# Patient Record
Sex: Female | Born: 1935 | Race: White | Hispanic: No | Smoking: Never smoker
Health system: Southern US, Community
[De-identification: ages and names within clinical notes are randomized; demographics above are authoritative.]

## PROBLEM LIST (undated history)

## (undated) DIAGNOSIS — I10 Essential (primary) hypertension: Secondary | ICD-10-CM

## (undated) DIAGNOSIS — C4490 Unspecified malignant neoplasm of skin, unspecified: Secondary | ICD-10-CM

## (undated) DIAGNOSIS — Z950 Presence of cardiac pacemaker: Secondary | ICD-10-CM

## (undated) DIAGNOSIS — E1140 Type 2 diabetes mellitus with diabetic neuropathy, unspecified: Secondary | ICD-10-CM

## (undated) DIAGNOSIS — E538 Deficiency of other specified B group vitamins: Secondary | ICD-10-CM

## (undated) DIAGNOSIS — I2510 Atherosclerotic heart disease of native coronary artery without angina pectoris: Secondary | ICD-10-CM

## (undated) DIAGNOSIS — R001 Bradycardia, unspecified: Secondary | ICD-10-CM

## (undated) DIAGNOSIS — I5032 Chronic diastolic (congestive) heart failure: Secondary | ICD-10-CM

## (undated) DIAGNOSIS — E785 Hyperlipidemia, unspecified: Secondary | ICD-10-CM

## (undated) DIAGNOSIS — J8410 Pulmonary fibrosis, unspecified: Secondary | ICD-10-CM

## (undated) DIAGNOSIS — R7989 Other specified abnormal findings of blood chemistry: Secondary | ICD-10-CM

## (undated) DIAGNOSIS — K635 Polyp of colon: Secondary | ICD-10-CM

## (undated) DIAGNOSIS — D649 Anemia, unspecified: Secondary | ICD-10-CM

## (undated) DIAGNOSIS — R55 Syncope and collapse: Secondary | ICD-10-CM

## (undated) DIAGNOSIS — K631 Perforation of intestine (nontraumatic): Secondary | ICD-10-CM

## (undated) DIAGNOSIS — I6529 Occlusion and stenosis of unspecified carotid artery: Secondary | ICD-10-CM

## (undated) DIAGNOSIS — G250 Essential tremor: Secondary | ICD-10-CM

## (undated) DIAGNOSIS — I4891 Unspecified atrial fibrillation: Secondary | ICD-10-CM

## (undated) DIAGNOSIS — K5792 Diverticulitis of intestine, part unspecified, without perforation or abscess without bleeding: Secondary | ICD-10-CM

## (undated) HISTORY — PX: CORONARY ANGIOPLASTY WITH STENT PLACEMENT: SHX49

## (undated) HISTORY — DX: Polyp of colon: K63.5

## (undated) HISTORY — PX: APPENDECTOMY: SHX54

## (undated) HISTORY — DX: Essential tremor: G25.0

## (undated) HISTORY — DX: Hyperlipidemia, unspecified: E78.5

## (undated) HISTORY — DX: Unspecified atrial fibrillation: I48.91

## (undated) HISTORY — PX: NOSE SURGERY: SHX723

## (undated) HISTORY — DX: Essential (primary) hypertension: I10

## (undated) HISTORY — PX: DILATION AND CURETTAGE OF UTERUS: SHX78

## (undated) HISTORY — DX: Type 2 diabetes mellitus with diabetic neuropathy, unspecified: E11.40

## (undated) HISTORY — PX: COLONOSCOPY: SHX174

## (undated) HISTORY — DX: Bradycardia, unspecified: R00.1

## (undated) HISTORY — DX: Perforation of intestine (nontraumatic): K63.1

## (undated) HISTORY — DX: Deficiency of other specified B group vitamins: E53.8

## (undated) HISTORY — DX: Diverticulitis of intestine, part unspecified, without perforation or abscess without bleeding: K57.92

---

## 1998-09-30 ENCOUNTER — Other Ambulatory Visit: Admission: RE | Admit: 1998-09-30 | Discharge: 1998-09-30 | Payer: Self-pay | Admitting: Family Medicine

## 1999-07-17 ENCOUNTER — Encounter: Payer: Self-pay | Admitting: Family Medicine

## 1999-07-17 ENCOUNTER — Encounter: Admission: RE | Admit: 1999-07-17 | Discharge: 1999-07-17 | Payer: Self-pay | Admitting: Family Medicine

## 1999-10-28 ENCOUNTER — Other Ambulatory Visit: Admission: RE | Admit: 1999-10-28 | Discharge: 1999-10-28 | Payer: Self-pay | Admitting: Family Medicine

## 2002-05-17 ENCOUNTER — Other Ambulatory Visit: Admission: RE | Admit: 2002-05-17 | Discharge: 2002-05-17 | Payer: Self-pay | Admitting: Family Medicine

## 2003-09-10 ENCOUNTER — Emergency Department (HOSPITAL_COMMUNITY): Admission: EM | Admit: 2003-09-10 | Discharge: 2003-09-10 | Payer: Self-pay | Admitting: Emergency Medicine

## 2003-10-07 ENCOUNTER — Ambulatory Visit (HOSPITAL_COMMUNITY): Admission: RE | Admit: 2003-10-07 | Discharge: 2003-10-07 | Payer: Self-pay | Admitting: Orthopedic Surgery

## 2005-03-15 ENCOUNTER — Other Ambulatory Visit: Admission: RE | Admit: 2005-03-15 | Discharge: 2005-03-15 | Payer: Self-pay | Admitting: Family Medicine

## 2006-11-16 ENCOUNTER — Ambulatory Visit: Payer: Self-pay | Admitting: Vascular Surgery

## 2007-05-16 ENCOUNTER — Ambulatory Visit: Payer: Self-pay | Admitting: Gastroenterology

## 2007-05-31 ENCOUNTER — Ambulatory Visit: Payer: Self-pay | Admitting: Gastroenterology

## 2007-05-31 ENCOUNTER — Encounter: Payer: Self-pay | Admitting: Gastroenterology

## 2007-05-31 ENCOUNTER — Encounter (INDEPENDENT_AMBULATORY_CARE_PROVIDER_SITE_OTHER): Payer: Self-pay | Admitting: *Deleted

## 2007-05-31 DIAGNOSIS — Z860100 Personal history of colon polyps, unspecified: Secondary | ICD-10-CM | POA: Insufficient documentation

## 2007-07-28 DIAGNOSIS — E119 Type 2 diabetes mellitus without complications: Secondary | ICD-10-CM

## 2007-07-28 DIAGNOSIS — E785 Hyperlipidemia, unspecified: Secondary | ICD-10-CM | POA: Insufficient documentation

## 2007-07-28 DIAGNOSIS — I10 Essential (primary) hypertension: Secondary | ICD-10-CM | POA: Insufficient documentation

## 2007-11-14 ENCOUNTER — Ambulatory Visit: Payer: Self-pay | Admitting: Vascular Surgery

## 2008-10-01 ENCOUNTER — Encounter: Admission: RE | Admit: 2008-10-01 | Discharge: 2008-10-01 | Payer: Self-pay | Admitting: Neurology

## 2008-11-05 ENCOUNTER — Ambulatory Visit: Payer: Self-pay | Admitting: Vascular Surgery

## 2008-11-13 ENCOUNTER — Ambulatory Visit: Payer: Self-pay | Admitting: Cardiology

## 2008-11-26 ENCOUNTER — Telehealth (INDEPENDENT_AMBULATORY_CARE_PROVIDER_SITE_OTHER): Payer: Self-pay | Admitting: Radiology

## 2008-11-27 ENCOUNTER — Encounter (INDEPENDENT_AMBULATORY_CARE_PROVIDER_SITE_OTHER): Payer: Self-pay | Admitting: Radiology

## 2008-11-27 ENCOUNTER — Ambulatory Visit: Payer: Self-pay

## 2008-12-04 ENCOUNTER — Telehealth (INDEPENDENT_AMBULATORY_CARE_PROVIDER_SITE_OTHER): Payer: Self-pay | Admitting: *Deleted

## 2008-12-05 ENCOUNTER — Encounter: Payer: Self-pay | Admitting: Cardiology

## 2008-12-05 ENCOUNTER — Ambulatory Visit: Payer: Self-pay

## 2008-12-12 ENCOUNTER — Encounter (INDEPENDENT_AMBULATORY_CARE_PROVIDER_SITE_OTHER): Payer: Self-pay | Admitting: *Deleted

## 2009-01-31 ENCOUNTER — Encounter: Payer: Self-pay | Admitting: Cardiology

## 2009-02-25 DIAGNOSIS — R0602 Shortness of breath: Secondary | ICD-10-CM

## 2009-02-25 DIAGNOSIS — E669 Obesity, unspecified: Secondary | ICD-10-CM

## 2009-02-25 DIAGNOSIS — I491 Atrial premature depolarization: Secondary | ICD-10-CM | POA: Insufficient documentation

## 2009-02-26 ENCOUNTER — Ambulatory Visit: Payer: Self-pay | Admitting: Cardiology

## 2009-05-05 ENCOUNTER — Encounter: Payer: Self-pay | Admitting: Cardiology

## 2009-05-21 ENCOUNTER — Encounter: Payer: Self-pay | Admitting: Cardiology

## 2009-05-26 ENCOUNTER — Encounter: Payer: Self-pay | Admitting: Cardiology

## 2009-06-02 ENCOUNTER — Encounter: Payer: Self-pay | Admitting: Cardiology

## 2009-06-02 ENCOUNTER — Ambulatory Visit: Payer: Self-pay | Admitting: Cardiology

## 2009-06-04 ENCOUNTER — Encounter: Payer: Self-pay | Admitting: Cardiology

## 2009-06-05 ENCOUNTER — Inpatient Hospital Stay (HOSPITAL_COMMUNITY): Admission: RE | Admit: 2009-06-05 | Discharge: 2009-06-06 | Payer: Self-pay | Admitting: Cardiology

## 2009-06-05 ENCOUNTER — Ambulatory Visit: Payer: Self-pay | Admitting: Cardiology

## 2009-06-06 ENCOUNTER — Encounter: Payer: Self-pay | Admitting: Cardiology

## 2009-06-09 ENCOUNTER — Encounter: Payer: Self-pay | Admitting: Cardiology

## 2009-06-09 ENCOUNTER — Ambulatory Visit: Payer: Self-pay

## 2009-06-09 ENCOUNTER — Ambulatory Visit: Payer: Self-pay | Admitting: Cardiology

## 2009-06-09 ENCOUNTER — Ambulatory Visit (HOSPITAL_COMMUNITY): Admission: RE | Admit: 2009-06-09 | Discharge: 2009-06-09 | Payer: Self-pay | Admitting: Cardiology

## 2009-06-10 ENCOUNTER — Encounter: Payer: Self-pay | Admitting: Cardiology

## 2009-06-16 ENCOUNTER — Ambulatory Visit: Payer: Self-pay | Admitting: Cardiology

## 2009-06-16 DIAGNOSIS — I2510 Atherosclerotic heart disease of native coronary artery without angina pectoris: Secondary | ICD-10-CM | POA: Insufficient documentation

## 2009-06-24 ENCOUNTER — Encounter: Payer: Self-pay | Admitting: Pulmonary Disease

## 2009-06-24 ENCOUNTER — Ambulatory Visit: Payer: Self-pay | Admitting: Internal Medicine

## 2009-06-24 ENCOUNTER — Ambulatory Visit: Payer: Self-pay | Admitting: Cardiology

## 2009-06-29 LAB — CONVERTED CEMR LAB
CO2: 38 meq/L — ABNORMAL HIGH (ref 19–32)
Calcium: 11 mg/dL — ABNORMAL HIGH (ref 8.4–10.5)
Chloride: 97 meq/L (ref 96–112)
Sodium: 141 meq/L (ref 135–145)

## 2009-07-18 ENCOUNTER — Ambulatory Visit: Payer: Self-pay | Admitting: Pulmonary Disease

## 2009-07-18 ENCOUNTER — Ambulatory Visit: Payer: Self-pay | Admitting: Cardiology

## 2009-07-18 DIAGNOSIS — I5043 Acute on chronic combined systolic (congestive) and diastolic (congestive) heart failure: Secondary | ICD-10-CM

## 2009-07-18 DIAGNOSIS — J8410 Pulmonary fibrosis, unspecified: Secondary | ICD-10-CM

## 2009-07-25 ENCOUNTER — Ambulatory Visit: Payer: Self-pay | Admitting: Pulmonary Disease

## 2009-07-25 LAB — CONVERTED CEMR LAB
ANA Titer 1: NEGATIVE
Rhuematoid fact SerPl-aCnc: <20 intl units/mL (ref 0.0–20.0)
Sed Rate: 31 mm/hr — ABNORMAL HIGH (ref 0–22)

## 2009-08-06 ENCOUNTER — Telehealth (INDEPENDENT_AMBULATORY_CARE_PROVIDER_SITE_OTHER): Payer: Self-pay | Admitting: *Deleted

## 2009-08-07 ENCOUNTER — Ambulatory Visit: Payer: Self-pay | Admitting: Pulmonary Disease

## 2009-08-09 DIAGNOSIS — G4730 Sleep apnea, unspecified: Secondary | ICD-10-CM | POA: Insufficient documentation

## 2009-08-12 ENCOUNTER — Ambulatory Visit: Payer: Self-pay | Admitting: Cardiology

## 2009-08-12 ENCOUNTER — Ambulatory Visit: Payer: Self-pay | Admitting: Pulmonary Disease

## 2009-08-12 ENCOUNTER — Inpatient Hospital Stay (HOSPITAL_COMMUNITY): Admission: AD | Admit: 2009-08-12 | Discharge: 2009-08-25 | Payer: Self-pay | Admitting: Pulmonary Disease

## 2009-08-12 ENCOUNTER — Ambulatory Visit: Payer: Self-pay | Admitting: Internal Medicine

## 2009-08-20 ENCOUNTER — Encounter: Payer: Self-pay | Admitting: Cardiology

## 2009-08-24 ENCOUNTER — Encounter: Payer: Self-pay | Admitting: Cardiology

## 2009-08-27 ENCOUNTER — Telehealth: Payer: Self-pay | Admitting: Cardiology

## 2009-09-04 ENCOUNTER — Encounter: Payer: Self-pay | Admitting: Cardiology

## 2009-09-05 ENCOUNTER — Ambulatory Visit: Payer: Self-pay | Admitting: Cardiology

## 2009-09-05 ENCOUNTER — Ambulatory Visit: Payer: Self-pay | Admitting: Pulmonary Disease

## 2009-09-05 DIAGNOSIS — I4891 Unspecified atrial fibrillation: Secondary | ICD-10-CM | POA: Insufficient documentation

## 2009-09-10 ENCOUNTER — Encounter: Payer: Self-pay | Admitting: Pulmonary Disease

## 2009-09-11 ENCOUNTER — Ambulatory Visit: Payer: Self-pay | Admitting: Cardiology

## 2009-09-15 ENCOUNTER — Ambulatory Visit: Payer: Self-pay | Admitting: Pulmonary Disease

## 2009-10-22 ENCOUNTER — Ambulatory Visit: Payer: Self-pay | Admitting: Cardiology

## 2009-10-22 DIAGNOSIS — R609 Edema, unspecified: Secondary | ICD-10-CM

## 2009-10-27 ENCOUNTER — Encounter: Payer: Self-pay | Admitting: Cardiology

## 2009-10-28 ENCOUNTER — Ambulatory Visit: Payer: Self-pay | Admitting: Vascular Surgery

## 2009-11-11 ENCOUNTER — Telehealth (INDEPENDENT_AMBULATORY_CARE_PROVIDER_SITE_OTHER): Payer: Self-pay | Admitting: *Deleted

## 2009-12-01 ENCOUNTER — Encounter: Payer: Self-pay | Admitting: Cardiology

## 2009-12-03 ENCOUNTER — Ambulatory Visit: Payer: Self-pay | Admitting: Cardiology

## 2009-12-04 ENCOUNTER — Ambulatory Visit: Payer: Self-pay | Admitting: Pulmonary Disease

## 2010-04-03 ENCOUNTER — Ambulatory Visit: Payer: Self-pay | Admitting: Pulmonary Disease

## 2010-04-22 ENCOUNTER — Ambulatory Visit: Payer: Self-pay | Admitting: Cardiology

## 2010-05-19 ENCOUNTER — Encounter: Payer: Self-pay | Admitting: Gastroenterology

## 2010-07-08 ENCOUNTER — Encounter (INDEPENDENT_AMBULATORY_CARE_PROVIDER_SITE_OTHER): Payer: Self-pay | Admitting: *Deleted

## 2010-07-28 NOTE — Assessment & Plan Note (Signed)
Summary: rov/ mbw   Visit Type:  Follow-up Copy to:  Dr. Antoine Poche Primary Provider/Referring Provider:  Belva Agee, NP [facility])  CC:  Pt here for follow. Pt c/o SOB and weakness with exertion. Pt states finished zpak last Friday for URI.  History of Present Illness: 77/F never smoker for FU of ILD, coronary artery disease & diastolic congestive heart failure. She is dyspneic on walking short distances, insidious onset x 9 months, gradually progressive,  on O2 since 12/10.she hsa slept in a recliner x 3 years, c/o orthopnea &pedal edema  Work up so far - Echo - RVSP 35, EF 55-60%, grade 2 diastolic dysfunction Cath >> RA 14, RV 62/19, PA 60/19, PCWP 28, CI 2.3 PFTs >> moderate restriction, intraparenchymal, TLC 61%, DLCO 48%, FVC 52% ABG >> 7.36/61/75 when hospitalised 12/10 CXR s/o cardiomegaly with pulmonary edema but cannot r/o occult fibrosis. CT chest >> Mild air trapping suggests small airways disease, very mild subpleural fibrotic changes in the lower lobes,  4 mm right upper lobe nodule, Pulmonary arterial hypertension. ESR 31, TSH nml, RA neg, ANA neg DId not tolerate empiric steroid trial due to hyperglycemia. Hosp admission 2/15-28, diuresed well, failed cardioversion, dc'd on 80 mg lasix bid  September 05, 2009 2:57 PM  POst hosp visit, for some reason only taking lasix once daily & has started gaining weight again, remains in A-fibn & dyspneic on short distances. Compliant with O2, does not want to start pulm rehab, 'my golfing days are over..'  December 04, 2009 12:04 PM  wt 193, dyspne aat new baseline, c/o pedal edema on lasix 40 two times a day . Able to go short duration off O2  Current Medications (verified): 1)  Metformin Hcl 1000 Mg Tabs (Metformin Hcl) .Marland Kitchen.. 1 By Mouth Two Times A Day 2)  Levemir Flexpen 100 Unit/ml Soln (Insulin Detemir) .... As Directed 3)  Aspirin 81 Mg  Tabs (Aspirin) .Marland Kitchen.. 1 By Mouth Daily 4)  Xalatan 0.005 % Soln (Latanoprost)  .... At Bedtime 5)  Vitamin D 1000 Unit Tabs (Cholecalciferol) .Marland Kitchen.. 1 By Mouth Daily 6)  Multivitamins   Tabs (Multiple Vitamin) .Marland Kitchen.. 1 By Mouth Daily 7)  Nitrostat 0.4 Mg Subl (Nitroglycerin) .... As Directed 8)  Isosorbide Mononitrate Cr 30 Mg Xr24h-Tab (Isosorbide Mononitrate) .... One Daily 9)  Furosemide 40 Mg Tabs (Furosemide) .... Take 1 Tablet By Mouth Two Times A Day 10)  Klor-Con M10 10 Meq Cr-Tabs (Potassium Chloride Crys Cr) .... Take 1 Tablet By Mouth Once A Day 11)  Glimepiride 4 Mg Tabs (Glimepiride) .... Two Times A Day 12)  Diltiazem Hcl Er Beads 420 Mg Xr24h-Cap (Diltiazem Hcl Er Beads) .... One By Mouth Daily 13)  Coumadin 5 Mg Tabs (Warfarin Sodium) .... Take 2 Tab By Mouth At Bedtime 14)  Digoxin 0.125 Mg Tabs (Digoxin) .... Take 1 Tablet By Mouth Once A Day 15)  Metoprolol 25 .... Take 1 Tablet By Mouth Two Times A Day 16)  Pantoprazole Sodium 40 Mg Tbec (Pantoprazole Sodium) .... Take 1 Tablet By Mouth Once A Day As Needed  Allergies (verified): 1)  Prednisone  Past History:  Past Medical History: Last updated: 09/05/2009 OBESITY (ICD-278.00) PREMATURE ATRIAL CONTRACTIONS (ICD-427.61) DYSPNEA (ICD-786.05) COLONIC POLYPS, ADENOMATOUS, HX OF (ICD-V12.72) HYPERLIPIDEMIA (ICD-272.4) DIABETES MELLITUS (ICD-250.00) HYPERTENSION (ICD-401.9) Nonobstructive CAD( normal left        main, left anterior descending (coronary artery) with short long        proximal calcification with 40% stenosis, diagonal  1 large with        long proximal to mid 80% stenosis, circumflex long proximal 40-50%        stenosis, right coronary artery large dominant with long mid 20%        stenosis, posterior descending (coronary) artery large and normal.        Left ventricular ejection fraction 65%) Skin cancer on face (approx 10 years ago) Persistent atrial fibrillation (failed cardioversion with Tikosyn)  Social History: Last updated: 09/05/2009 The patient is a widow.  She has no  children.  She  never smoked cigarettes, does not drink alcohol.  her husband died of Parkinson's in Nov 18, 2007     Review of Systems       The patient complains of dyspnea on exertion and peripheral edema.  The patient denies anorexia, fever, weight loss, weight gain, vision loss, decreased hearing, hoarseness, chest pain, syncope, prolonged cough, headaches, hemoptysis, abdominal pain, melena, hematochezia, severe indigestion/heartburn, hematuria, muscle weakness, suspicious skin lesions, difficulty walking, depression, unusual weight change, and abnormal bleeding.    Vital Signs:  Patient profile:   77 year old female Height:      63 inches Weight:      193 pounds O2 Sat:      94 % on 2 L/min Temp:     97.9 degrees F oral Pulse rate:   86 / minute BP sitting:   154 / 80  (right arm) Cuff size:   regular  Vitals Entered By: Zackery Barefoot CMA (December 04, 2009 11:54 AM)  O2 Flow:  2 L/min CC: Pt here for follow. Pt c/o SOB and weakness with exertion. Pt states finished zpak last Friday for URI Comments Medications reviewed with patient Verified contact number and pharmacy with patient Zackery Barefoot CMA  December 04, 2009 11:54 AM    Physical Exam  Additional Exam:  Gen. Pleasant, well-nourished, in no distress, normal affect ENT - no lesions, no post nasal drip Neck: No JVD, no thyromegaly, no carotid bruits Lungs: no use of accessory muscles, no dullness to percussion, dry bibasal 1/3 crackles, no rhonchi  Cardiovascular: Rhythm regular, heart sounds  normal, no murmurs or gallops, 2 + peripheral edema Musculoskeletal: No deformities, no cyanosis or clubbing      Impression & Recommendations:  Problem # 1:  CONGESTIVE HEART FAILURE (CHF) (ICD-428.0) target wt 195 or lower - incresae lasix dosing if overshoots. rest per dr hochrein Her updated medication list for this problem includes:    Aspirin 81 Mg Tabs (Aspirin) .Marland Kitchen... 1 by mouth daily    Furosemide 40 Mg Tabs  (Furosemide) .Marland Kitchen... Take 1 tablet by mouth two times a day    Coumadin 5 Mg Tabs (Warfarin sodium) .Marland Kitchen... Take 2 tab by mouth at bedtime    Digoxin 0.125 Mg Tabs (Digoxin) .Marland Kitchen... Take 1 tablet by mouth once a day  Orders: Est. Patient Level III (99213) T-2 View CXR (71020TC)  Problem # 2:  PULMONARY FIBROSIS (ICD-515) Unclear if there is any fibrosis at all. At any rate , not a candidate for biopsy Would be interesting to repeat High rest CT at some point in future  - doubt much to offer.  Medications Added to Medication List This Visit: 1)  Pantoprazole Sodium 40 Mg Tbec (Pantoprazole sodium) .... Take 1 tablet by mouth once a day as needed  Patient Instructions: 1)  Copy sent to: [facility] 2)  Please schedule a follow-up appointment in 4 months with TP. 3)  A chest x-ray has been recommended.  Your imaging study may require preauthorization.

## 2010-07-28 NOTE — Assessment & Plan Note (Signed)
Summary: sob///kp   Visit Type:  Follow-up Copy to:  Dr. Antoine Poche Primary Provider/Referring Provider:  Belva Agee, NP [facility])  CC:  Pt c/o increased S.O.B with activity and rest worse starting last night. Pt states was only able to get an hour of rest last night. Pt also c/o bilaterial foot and ankle edema.  History of Present Illness: 79/F never smoker for FU of dyspnea out of proportion to coronary artery disease & diastolic congestive heart failure. She is dyspneic on walking short distances, insidious onset x 9 months, gradually progressive, desaturated to 67% onarrival to the office today (left her O2 in the car ), on O2 since 11-Jun-2023. her husband died of Parkinson's in 11-11-07, she hsa slept in a recliner x 3 years, c/o orthopnea.. C/o pedal edema when she does not take lasix. Lasix dose increased to 80 two times a day. Work up so far - Echo - RVSP 35, EF 55-60%, grade 2 diastolic dysfunction Cath >> RA 14, RV 62/19, PA 60/19, PCWP 28, CI 2.3 PFTs >> moderate restriction, intraparenchymal, TLC 61%, DLCO 48%, FVC 52% ABG >> 7.36/61/75 when hospitalised June 11, 2023 CXR s/o cardiomegaly with pulmonary edema but cannot r/o occult fibrosis.   Reviewed CT chest >>1. Mild air trapping suggests small airways disease. 2.  Respiratory motion degrades image quality.  Cannot exclude very mild subpleural fibrotic changes in the lower lobes. 3.  4 mm right upper lobe nodule.  4.  Pulmonary arterial hypertension. ESR 31, TSH nml, RA neg, ANA neg  August 12, 2009 4:31 PM  Detailed discussion regarding biopsy vs empiric treatment with pt & brother. Empiric steroid trial started with ceiling of 20 mg compliacted by high sugars. c/o increased dyspnea, 'could not sleep all night' , increased pedal edema  Current Medications (verified): 1)  Metformin Hcl 1000 Mg Tabs (Metformin Hcl) .Marland Kitchen.. 1 By Mouth Two Times A Day 2)  Levemir Flexpen 100 Unit/ml Soln (Insulin Detemir) .... As  Directed 3)  Propranolol Hcl 60 Mg Tabs (Propranolol Hcl) .... 2 By Mouth Two Times A Day 4)  Amlodipine Besylate 10 Mg Tabs (Amlodipine Besylate) .Marland Kitchen.. 1 By Mouth Daily 5)  Aspirin 81 Mg  Tabs (Aspirin) .Marland Kitchen.. 1 By Mouth Daily 6)  Xalatan 0.005 % Soln (Latanoprost) .... At Bedtime 7)  Vitamin D 1000 Unit Tabs (Cholecalciferol) .Marland Kitchen.. 1 By Mouth Daily 8)  Multivitamins   Tabs (Multiple Vitamin) .Marland Kitchen.. 1 By Mouth Daily 9)  Nitrostat 0.4 Mg Subl (Nitroglycerin) .... As Directed 10)  Isosorbide Mononitrate Cr 30 Mg Xr24h-Tab (Isosorbide Mononitrate) .... One Daily 11)  Furosemide 80 Mg Tabs (Furosemide) .... Take 1 Tablet By Mouth Once A Day 12)  Klor-Con M20 20 Meq Cr-Tabs (Potassium Chloride Crys Cr) .Marland Kitchen.. 1 By Mouth Daily 13)  Prednisone 5 Mg Tabs (Prednisone) .Marland Kitchen.. 1 Tab Once Daily X 1 Wk Then 2 Tabs Once Daily (Pt Currently Taking 10mg  As of 08-12-09) 14)  Glimepiride 4 Mg Tabs (Glimepiride) .... Two Times A Day  Allergies (verified): No Known Drug Allergies  Past History:  Past Medical History: Last updated: 07/18/2009 OBESITY (ICD-278.00) PREMATURE ATRIAL CONTRACTIONS (ICD-427.61) DYSPNEA (ICD-786.05) COLONIC POLYPS, ADENOMATOUS, HX OF (ICD-V12.72) HYPERLIPIDEMIA (ICD-272.4) DIABETES MELLITUS (ICD-250.00) HYPERTENSION (ICD-401.9) Nonobstructive CAD( normal left        main, left anterior descending (coronary artery) with short long        proximal calcification with 40% stenosis, diagonal 1 large with        long proximal to mid 80% stenosis, circumflex  long proximal 40-50%        stenosis, right coronary artery large dominant with long mid 20%        stenosis, posterior descending (coronary) artery large and normal.        Left ventricular ejection fraction 65%)  Skin cancer on face (approx 10 years ago)  Past Surgical History: Last updated: 06/02/2009 Appendectomy Nasal surgery.   Family History: Last updated: 07/18/2009  Noncontributory for early coronary artery disease.   She   does have later onset heart disease in her brother who is alive  with  stents.  Family History Prostate Cancer-father Throat cancer-brother Family History Diabetes-mother, brothers  Social History: Last updated: 02/25/2009 The patient is a widow.  She has no children.  She  never smoked cigarettes, does not drink alcohol.      Review of Systems       The patient complains of dyspnea on exertion and peripheral edema.  The patient denies anorexia, fever, weight loss, weight gain, vision loss, decreased hearing, hoarseness, chest pain, syncope, prolonged cough, headaches, hemoptysis, abdominal pain, melena, hematochezia, severe indigestion/heartburn, hematuria, muscle weakness, suspicious skin lesions, difficulty walking, depression, unusual weight change, and abnormal bleeding.    Vital Signs:  Patient profile:   77 year old female Height:      63 inches Weight:      225.50 pounds O2 Sat:      98 % on 3 L/min Temp:     98.2 degrees F oral Pulse rate:   120 / minute BP sitting:   120 / 88  (left arm) Cuff size:   large  Vitals Entered By: Zackery Barefoot CMA (August 12, 2009 4:23 PM)  O2 Flow:  3 L/min CC: Pt c/o increased S.O.B with activity and rest worse starting last night. Pt states was only able to get an hour of rest last night. Pt also c/o bilaterial foot and ankle edema Comments Medications reviewed with patient Verified pt's contact number Zackery Barefoot CMA  August 12, 2009 4:24 PM    Physical Exam  Additional Exam:  Gen. Pleasant, well-nourished, in no distress, normal affect ENT - no lesions, no post nasal drip Neck: No JVD, no thyromegaly, no carotid bruits Lungs: no use of accessory muscles, no dullness to percussion, dry bibasal 1/3 crackles, no rhonchi  Cardiovascular: Rhythm regular, heart sounds  normal, no murmurs or gallops, 2 + peripheral edema Musculoskeletal: No deformities, no cyanosis or clubbing      Impression &  Recommendations:  Problem # 1:  CONGESTIVE HEART FAILURE (CHF) (ICD-428.0)  Worsening cor pulmonale. Will admit for IV diuresis. Have d/w Dr Antoine Poche & informed on call cardiologist who will see tomorrow chk BNP, renal fn & cardiac enzymes Her updated medication list for this problem includes:    Propranolol Hcl 60 Mg Tabs (Propranolol hcl) .Marland Kitchen... 2 by mouth two times a day    Aspirin 81 Mg Tabs (Aspirin) .Marland Kitchen... 1 by mouth daily    Furosemide 80 Mg Tabs (Furosemide) .Marland Kitchen... Take 1 tablet by mouth once a day  Orders: EMR Misc Charge Code [facility])  Problem # 2:  PULMONARY FIBROSIS (ICD-515) Ground glass on CT, morbidity of surgical biopsy will be high & is best avoided here. Feel bronchoscopy will be a low yield procedure. ct empiric 20 mg prednisone  Problem # 3:  DIABETES MELLITUS (ICD-250.00)  Likley to get worse with stroids Resistant SSI  Have d/w Belva Agee NP Her updated medication list for this problem includes:  Metformin Hcl 1000 Mg Tabs (Metformin hcl) .Marland Kitchen... 1 by mouth two times a day    Levemir Flexpen 100 Unit/ml Soln (Insulin detemir) .Marland Kitchen... As directed    Aspirin 81 Mg Tabs (Aspirin) .Marland Kitchen... 1 by mouth daily    Glimepiride 4 Mg Tabs (Glimepiride) .Marland Kitchen..Marland Kitchen Two times a day  Orders: EMR Electrical engineer Code [facility])  Problem # 4:  OBSTRUCTIVE SLEEP APNEA (ICD-780.57)  Empiric trial of CPAP 8 cm during hospitalisation If toelrates, will pursue out pt sleep study Hypercarbia on ABG denotes some element of chronic resp failure  Orders: EMR Misc Charge Code [facility])  Medications Added to Medication List This Visit: 1)  Furosemide 80 Mg Tabs (Furosemide) .... Take 1 tablet by mouth once a day 2)  Prednisone 5 Mg Tabs (Prednisone) .Marland Kitchen.. 1 tab once daily x 1 wk then 2 tabs once daily (pt currently taking 10mg  as of 08-12-09)  Patient Instructions: 1)  Admit to tele  2)  Copy sent to: [facility]

## 2010-07-28 NOTE — Assessment & Plan Note (Signed)
Summary: [facility]      Allergies Added:   Visit Type:  Follow-up Referring Provider:  Dr. Antoine Poche Primary Provider:  Belva Agee, NP [facility])  CC:  Atrial Fibrillation.  History of Present Illness: The patient presents for followup of atrial fibrillation. Since I last saw her she has had no new complaints. She wears her oxygen most of the time and checks and oxygen saturation on a home monitor. She says it runs in the 90s with oxygen. I did have her wear a Holter monitor which demonstrated overall a controlled ventricular rate though she does have occasional short spikes of a heart rate into the 150s. These are not short lived. She doesn't really feel severe tachycardia palpitations and has had no presyncope or syncope. She denies any chest pain.  Current Medications (verified): 1)  Metformin Hcl 1000 Mg Tabs (Metformin Hcl) .Marland Kitchen.. 1 By Mouth Two Times A Day 2)  Levemir Flexpen 100 Unit/ml Soln (Insulin Detemir) .... As Directed 3)  Aspirin 81 Mg  Tabs (Aspirin) .Marland Kitchen.. 1 By Mouth Daily 4)  Xalatan 0.005 % Soln (Latanoprost) .... At Bedtime 5)  Vitamin D 1000 Unit Tabs (Cholecalciferol) .Marland Kitchen.. 1 By Mouth Daily 6)  Multivitamins   Tabs (Multiple Vitamin) .Marland Kitchen.. 1 By Mouth Daily 7)  Nitrostat 0.4 Mg Subl (Nitroglycerin) .... As Directed 8)  Isosorbide Mononitrate Cr 30 Mg Xr24h-Tab (Isosorbide Mononitrate) .... One Daily 9)  Furosemide 40 Mg Tabs (Furosemide) .... Take 1 Tablet By Mouth Two Times A Day 10)  Klor-Con M10 10 Meq Cr-Tabs (Potassium Chloride Crys Cr) .... Take 1 Tablet By Mouth Once A Day 11)  Glimepiride 4 Mg Tabs (Glimepiride) .... Two Times A Day 12)  Diltiazem Hcl Er Beads 420 Mg Xr24h-Cap (Diltiazem Hcl Er Beads) .... One By Mouth Daily 13)  Coumadin 5 Mg Tabs (Warfarin Sodium) .... Take 2 Tab By Mouth At Bedtime 14)  Digoxin 0.125 Mg Tabs (Digoxin) .... Take 1 Tablet By Mouth Once A Day 15)  Metoprolol 25 .... Take 1 Tablet By Mouth Two Times A  Day 16)  Pantoprazole Sodium 40 Mg Tbec (Pantoprazole Sodium) .... Take 1 Tablet By Mouth Once A Day  Allergies (verified): 1)  Prednisone  Past History:  Past Medical History: Reviewed history from 09/05/2009 and no changes required. OBESITY (ICD-278.00) PREMATURE ATRIAL CONTRACTIONS (ICD-427.61) DYSPNEA (ICD-786.05) COLONIC POLYPS, ADENOMATOUS, HX OF (ICD-V12.72) HYPERLIPIDEMIA (ICD-272.4) DIABETES MELLITUS (ICD-250.00) HYPERTENSION (ICD-401.9) Nonobstructive CAD( normal left        main, left anterior descending (coronary artery) with short long        proximal calcification with 40% stenosis, diagonal 1 large with        long proximal to mid 80% stenosis, circumflex long proximal 40-50%        stenosis, right coronary artery large dominant with long mid 20%        stenosis, posterior descending (coronary) artery large and normal.        Left ventricular ejection fraction 65%) Skin cancer on face (approx 10 years ago) Persistent atrial fibrillation (failed cardioversion with Tikosyn)  Past Surgical History: Reviewed history from 06/02/2009 and no changes required. Appendectomy Nasal surgery.   Review of Systems       As stated in the HPI and negative for all other systems.   Vital Signs:  Patient profile:   77 year old female Height:      63 inches Weight:      192 pounds BMI:  34.13 Pulse rate:   108 / minute Resp:     16 per minute BP sitting:   146 / 80  (right arm)  Vitals Entered By: Marrion Coy, CNA (December 03, 2009 10:32 AM)  Physical Exam  General:  Well developed, well nourished, in no acute distress. Head:  normocephalic and atraumatic Eyes:  PERRLA/EOM intact; conjunctiva and lids normal. Mouth:  Teeth, gums and palate normal. Oral mucosa normal. Neck:  Neck supple, no JVD. No masses, thyromegaly or abnormal cervical nodes. Chest Wall:  no deformities or breast masses noted Lungs:  bilateral fine crackles one third the way up Abdomen:  Bowel  sounds positive; abdomen soft and non-tender without masses, organomegaly, or hernias noted. No hepatosplenomegaly, obese Msk:  Back normal, normal gait. Muscle strength and tone normal. Extremities:  mild bilateral lower extremity edema to the knees left greater than right. Neurologic:  Alert and oriented x 3, resting tremor Skin:  Intact without lesions or rashes. Psych:  Normal affect.   Detailed Cardiovascular Exam  Neck    Carotids: Carotids full and equal bilaterally without bruits.      Neck Veins: Normal, no JVD.    Heart    Inspection: no deformities or lifts noted.      Palpation: normal PMI with no thrills palpable.      Auscultation: iregular rate and rhythm, S1, S2 without murmurs, rubs, gallops, or clicks.    Vascular    Abdominal Aorta: no palpable masses, pulsations, or audible bruits.      Femoral Pulses: normal femoral pulses bilaterally.      Pedal Pulses: diminished right dorsalis pedis pulse, diminished right posterior tibial pulse, diminished left dorsalis pedis pulse, and diminished left posterior tibial pulse.      Radial Pulses: normal radial pulses bilaterally.      Peripheral Circulation: no clubbing, cyanosis, moderate left greater than right edema   Impression & Recommendations:  Problem # 1:  ATRIAL FIBRILLATION (ICD-427.31) Given her clinical situation I think her ventricular rate with her atrial fibrillation is reasonable. She will continue the meds as listed.  Problem # 2:  EDEMA (ICD-782.3) Or edema his baseline. She's not as good with salt restriction as I would hope and we discussed this.  Problem # 3:  CAD (ICD-414.00) She has no new symptoms. She will continue with risk reduction. No further testing is indicated.

## 2010-07-28 NOTE — Assessment & Plan Note (Signed)
Summary: [facility]  Medications Added PROTONIX 40 MG SOLR (PANTOPRAZOLE SODIUM) 1 by mouth daily      Allergies Added:    Visit Type:  Follow-up Primary Provider:  Belva Agee, NP [facility])  CC:  Atrial Fibrillation.  History of Present Illness: The patient presents for followup of her atrial fibrillation. Since I last saw her she has had no new cardiovascular complaints. This morning she had some nausea and vomiting but this seems to have resolved. She felt her heart thumping a little bit with this. Overall she's lost about 30 pounds. She thinks her breathing is improved though she wears oxygen when she he is active or at night.  She has had no lower extremity swelling. She denies any chest pressure, neck or arm discomfort. She is sleeping back in her bed which she had not been able to do for a while.  Current Medications (verified): 1)  Metformin Hcl 1000 Mg Tabs (Metformin Hcl) .Marland Kitchen.. 1 Tab By Mouth With Dinner 2)  Levemir Flexpen 100 Unit/ml Soln (Insulin Detemir) .... 25 Units Once Daily 3)  Aspirin 81 Mg  Tabs (Aspirin) .Marland Kitchen.. 1 By Mouth Daily 4)  Xalatan 0.005 % Soln (Latanoprost) .Marland Kitchen.. 1 Drop Each Eye At Bedtime 5)  Vitamin D 1000 Unit Tabs (Cholecalciferol) .Marland Kitchen.. 1 By Mouth Daily 6)  Multivitamins   Tabs (Multiple Vitamin) .Marland Kitchen.. 1 By Mouth Daily 7)  Nitrostat 0.4 Mg Subl (Nitroglycerin) .Marland Kitchen.. 1 Under Tongue Every 5 Minutes X3 As Needed Chest Pain 8)  Isosorbide Mononitrate Cr 30 Mg Xr24h-Tab (Isosorbide Mononitrate) .... One Daily 9)  Furosemide 40 Mg Tabs (Furosemide) .Marland Kitchen.. 1-2 Tabs By Mouth Once Daily 10)  Klor-Con M10 10 Meq Cr-Tabs (Potassium Chloride Crys Cr) .... Take 1 Tablet By Mouth Once A Day 11)  Glimepiride 4 Mg Tabs (Glimepiride) .... Two Times A Day 12)  Diltiazem Hcl Er Beads 420 Mg Xr24h-Cap (Diltiazem Hcl Er Beads) .... One By Mouth Daily 13)  Coumadin 5 Mg Tabs (Warfarin Sodium) .... As As Directed By Dr. Antoine Poche 14)  Digoxin 0.125 Mg  Tabs (Digoxin) .... Take 1 Tablet By Mouth Once A Day 15)  Metoprolol Tartrate 25 Mg Tabs (Metoprolol Tartrate) .... Take One Tablet Two Times A Day 16)  Oxygen .... Wear 2l/min Continuously and At Bedtime 17)  Protonix 40 Mg Solr (Pantoprazole Sodium) .Marland Kitchen.. 1 By Mouth Daily  Allergies (verified): 1)  Prednisone  Past History:  Past Medical History: Reviewed history from 09/05/2009 and no changes required. OBESITY (ICD-278.00) PREMATURE ATRIAL CONTRACTIONS (ICD-427.61) DYSPNEA (ICD-786.05) COLONIC POLYPS, ADENOMATOUS, HX OF (ICD-V12.72) HYPERLIPIDEMIA (ICD-272.4) DIABETES MELLITUS (ICD-250.00) HYPERTENSION (ICD-401.9) Nonobstructive CAD( normal left        main, left anterior descending (coronary artery) with short long        proximal calcification with 40% stenosis, diagonal 1 large with        long proximal to mid 80% stenosis, circumflex long proximal 40-50%        stenosis, right coronary artery large dominant with long mid 20%        stenosis, posterior descending (coronary) artery large and normal.        Left ventricular ejection fraction 65%) Skin cancer on face (approx 10 years ago) Persistent atrial fibrillation (failed cardioversion with Tikosyn)  Past Surgical History: Reviewed history from 06/02/2009 and no changes required. Appendectomy Nasal surgery.   Review of Systems       As stated in the HPI and negative for all other systems.  Vital Signs:  Patient profile:   77 year old female Height:      63 inches Weight:      175 pounds BMI:     31.11 Pulse rate:   66 / minute Resp:     16 per minute BP sitting:   118 / 68  (right arm)  Vitals Entered By: Marrion Coy, CNA (April 22, 2010 2:09 PM)  Physical Exam  General:  Well developed, well nourished, in no acute distress. Head:  normocephalic and atraumatic Eyes:  PERRLA/EOM intact; conjunctiva and lids normal. Mouth:  Teeth, gums and palate normal. Oral mucosa normal. Neck:  Neck supple, no  JVD. No masses, thyromegaly or abnormal cervical nodes. Chest Wall:  no deformities  Lungs:  Clear bilaterally to auscultation and percussion. Heart:  Non-displaced PMI, chest non-tender; irregular rate and rhythm, S1, S2 without murmurs, rubs or gallops. Carotid upstroke normal, soft bruit. Normal abdominal aortic size, no bruits. Femorals normal pulses, no bruits. Pedals normal pulses. No edema, no varicosities. Abdomen:  Bowel sounds positive; abdomen soft and non-tender without masses, organomegaly, or hernias noted. No hepatosplenomegaly, obese Msk:  Back normal, normal gait. Muscle strength and tone normal. Extremities:  mild bilateral lower extremity edema to the knees left greater than right. Neurologic:  Resting tremor Skin:  Intact without lesions or rashes. Cervical Nodes:  no significant adenopathy Psych:  Normal affect.   EKG  Procedure date:  04/22/2010  Findings:      Atrial fibrillation, right axis deviation, poor anterior R-wave progression  Impression & Recommendations:  Problem # 1:  ATRIAL FIBRILLATION (ICD-427.31) He seemed to be rate controlled. She has failed attempts to maintain rhythm and it does not seem that fibrillation contributes to her dyspnea. She will continue on the meds as listed. Orders: EKG w/ Interpretation (93000)  Problem # 2:  CONGESTIVE HEART FAILURE (CHF) (ICD-428.0) She seems to be euvolemic. She will continue meds as listed.  Problem # 3:  HYPERTENSION (ICD-401.9) Her blood pressure is controlled. She will continue with meds as listed.  Patient Instructions: 1)  Your physician recommends that you schedule a follow-up appointment in: 12 months with Dr Antoine Poche 2)  Your physician recommends that you continue on your current medications as directed. Please refer to the Current Medication list given to you today.

## 2010-07-28 NOTE — Assessment & Plan Note (Signed)
Summary: dyspnea/apc   Visit Type:  Initial Consult Copy to:  Dr. Antoine Poche Primary Provider/Referring Provider:  Belva Agee, NP [facility])  CC:  Pt here for pulmonary consult. Pt c/o S.O.B and chest "feels like it is going to pop" x 1 year.  History of Present Illness: 79?f never smoker for evaluation of dyspnea out of proportion to coronary artery disease & diastolic congestive heart failure. She is dyspneic on walking short distances, insidious onset x 9 months, gradually progressive, desaturated to 67% onarrival to the office today (left her O2 in the car ), on O2 since 07-06-23. her husband died of Parkinson's in 2007-12-06, she hsa slept in a recliner x 3 years, c/o orthopnea.. C/o pedal edema when she does not take lasix. Lasix dose increased to 80 two times a day. Work up so far - Echo - RVSP 35, EF 55-60%, grade 2 diastolic dysfunction Cath >> RA 14, RV 62/19, PA 60/19, PCWP 28, CI 2.3 PFTs >> moderate restriction, intraparenchymal, TLC 61%, DLCO 48%, FVC 52% ABG >> 7.36/61/75 when hospitalised 07/06/23 CXR s/o cardiomegaly with pulmonary edema but cannot r/o occult fibrosis.    Current Medications (verified): 1)  Actos 45 Mg Tabs (Pioglitazone Hcl) .Marland Kitchen.. 1 By Mouth Daily 2)  Metformin Hcl 1000 Mg Tabs (Metformin Hcl) .Marland Kitchen.. 1 By Mouth Two Times A Day 3)  Levemir Flexpen 100 Unit/ml Soln (Insulin Detemir) .... As Directed 4)  Propranolol Hcl 60 Mg Tabs (Propranolol Hcl) .... 2 By Mouth Two Times A Day 5)  Amlodipine Besylate 10 Mg Tabs (Amlodipine Besylate) .Marland Kitchen.. 1 By Mouth Daily 6)  Welchol 625 Mg Tabs (Colesevelam Hcl) .Marland Kitchen.. 1 By Mouth Daily 7)  Aspirin 81 Mg  Tabs (Aspirin) .Marland Kitchen.. 1 By Mouth Daily 8)  Xalatan 0.005 % Soln (Latanoprost) .... At Bedtime 9)  Vitamin D 1000 Unit Tabs (Cholecalciferol) .Marland Kitchen.. 1 By Mouth Daily 10)  Multivitamins   Tabs (Multiple Vitamin) .Marland Kitchen.. 1 By Mouth Daily 11)  Nitrostat 0.4 Mg Subl (Nitroglycerin) .... As Directed 12)  Isosorbide Mononitrate  Cr 30 Mg Xr24h-Tab (Isosorbide Mononitrate) .... One Daily 13)  Furosemide 80 Mg Tabs (Furosemide) .... One Twice A Day 14)  Klor-Con M20 20 Meq Cr-Tabs (Potassium Chloride Crys Cr) .Marland Kitchen.. 1 By Mouth Daily  Allergies (verified): No Known Drug Allergies  Past History:  Past Surgical History: Last updated: 06/02/2009 Appendectomy Nasal surgery.   Family History: Last updated: 07/18/2009  Noncontributory for early coronary artery disease.  She   does have later onset heart disease in her brother who is alive  with  stents.  Family History Prostate Cancer-father Throat cancer-brother Family History Diabetes-mother, brothers  Social History: Last updated: 02/25/2009 The patient is a widow.  She has no children.  She  never smoked cigarettes, does not drink alcohol.      Past Medical History: OBESITY (ICD-278.00) PREMATURE ATRIAL CONTRACTIONS (ICD-427.61) DYSPNEA (ICD-786.05) COLONIC POLYPS, ADENOMATOUS, HX OF (ICD-V12.72) HYPERLIPIDEMIA (ICD-272.4) DIABETES MELLITUS (ICD-250.00) HYPERTENSION (ICD-401.9) Nonobstructive CAD( normal left        main, left anterior descending (coronary artery) with short long        proximal calcification with 40% stenosis, diagonal 1 large with        long proximal to mid 80% stenosis, circumflex long proximal 40-50%        stenosis, right coronary artery large dominant with long mid 20%        stenosis, posterior descending (coronary) artery large and normal.  Left ventricular ejection fraction 65%)  Skin cancer on face (approx 10 years ago)  Family History:  Noncontributory for early coronary artery disease.  She   does have later onset heart disease in her brother who is alive  with  stents.  Family History Prostate Cancer-father Throat cancer-brother Family History Diabetes-mother, brothers  Review of Systems       The patient complains of shortness of breath with activity, shortness of breath at rest, chest pain, and weight  change.  The patient denies productive cough, non-productive cough, coughing up blood, irregular heartbeats, acid heartburn, indigestion, loss of appetite, abdominal pain, difficulty swallowing, sore throat, tooth/dental problems, headaches, nasal congestion/difficulty breathing through nose, sneezing, itching, ear ache, anxiety, depression, hand/feet swelling, joint stiffness or pain, rash, change in color of mucus, and fever.    Vital Signs:  Patient profile:   77 year old female Height:      63 inches Weight:      213.25 pounds O2 Sat:      97 % on Room air Temp:     97.7 degrees F oral Pulse rate:   69 / minute BP sitting:   142 / 68  (left arm) Cuff size:   large  Vitals Entered By: Zackery Barefoot CMA (July 18, 2009 2:16 PM)  O2 Flow:  Room air  Serial Vital Signs/Assessments:  Comments: Upon arrival pt's O2 67% RA, pt recovered to 97% 2 liters.  Zackery Barefoot CMA  July 18, 2009 2:26 PM  By: Zackery Barefoot CMA  3:16 PM Ambulatory Pulse Oximetry  Resting; HR__69___    02 Sat__96% on 2 liters___  Lap1 (185 feet)   HR_84____   02 Sat__86% on 2 liters___ Lap2 (185 feet)   HR_____   02 Sat_____    Lap3 (185 feet)   HR_____   02 Sat_____  ___Test Completed without Difficulty _x__Test Stopped due to:  drop in sats to 86% on 2 liters. The patient asked to stop due to chest discomfort. Patients oxygen was increased to 3 liters and her sats recoverd to 98% and pulse was 70.  By: Michel Bickers CMA   CC: Pt here for pulmonary consult. Pt c/o S.O.B, chest "feels like it is going to pop" x 1 year Comments Medications reviewed with patient Verified pt's contact number Zackery Barefoot CMA  July 18, 2009 2:16 PM    Physical Exam  Additional Exam:  Gen. Pleasant, well-nourished, in no distress, normal affect ENT - no lesions, no post nasal drip Neck: No JVD, no thyromegaly, no carotid bruits Lungs: no use of accessory muscles, no dullness to percussion, dry  bibasal 1/3 crackles, no rhonchi  Cardiovascular: Rhythm regular, heart sounds  normal, no murmurs or gallops, 2 + peripheral edema Abdomen: soft and non-tender, no hepatosplenomegaly, BS normal. Musculoskeletal: No deformities, no cyanosis or clubbing Neuro:  alert, non focal     CT of Chest  Procedure date:  07/18/2009  Findings:      IMPRESSION:   1.  Mild air trapping suggests small airways disease. 2.  Respiratory motion degrades image quality.  Cannot exclude very mild subpleural fibrotic changes in the lower lobes. 3.  4 mm right upper lobe nodule. If the patient is at high risk for bronchogenic carcinoma, follow-up chest CT at 1 year is recommended.  If the patient is at low risk, no follow-up is needed.  This recommendation follows the consensus statement: Guidelines for Management of Small Pulmonary Nodules Detected on CT Scans:  A Statement  from the Fleischner Society as published in Radiology 2005; 237:395-400.  Available online at: DietDisorder.cz. 4.  Pulmonary arterial hypertension.  Impression & Recommendations:  Problem # 1:  DYSPNEA (ICD-786.05) Unclear cause of persistent crackles & poor response to diuresis. Above & CXR very suspicious for pulmonary fibrosis. PFTS show moderate intraparanchymal restriction which would be consistent with both CHF & fibrosis.  Proceed with HRCT to clarify, prognosis would be guarded if this does turn out to be fibrosis. Orders: Consultation Level III (16109) Pulse Oximetry, Ambulatory (60454) Radiology Referral (Radiology)  Problem # 2:  CONGESTIVE HEART FAILURE (CHF) (ICD-428.0) diastolic dysfunction. Stay on lasix 80 two times a day  The following medications were removed from the medication list:    Aspirin 81 Mg Tabs (Aspirin) .Marland Kitchen... 1 by mouth daily Her updated medication list for this problem includes:    Propranolol Hcl 60 Mg Tabs (Propranolol hcl) .Marland Kitchen... 2 by mouth two times a  day    Aspirin 81 Mg Tabs (Aspirin) .Marland Kitchen... 1 by mouth daily    Furosemide 80 Mg Tabs (Furosemide) ..... One twice a day  Patient Instructions: 1)  Copy sent to: [facility] weeks, Dr Antoine Poche 2)  A High Resolution Chest CT has been recommended.  Your imaging study may require preauthorization.  3)  Use 2l O2 at rest &  4L O2 while walking until you see me again 4)  If no scar tissue, then we will check your thyroid   Not Administered:    Influenza Vaccine not given due to: declined

## 2010-07-28 NOTE — Assessment & Plan Note (Signed)
Summary: per check out/sf      Allergies Added: PREDNISONE  Visit Type:  Follow-up Referring Provider:  Dr. Antoine Poche Primary Provider:  Belva Agee, NP [facility])   History of Present Illness: The patient returns for followup of her atrial fibrillation with rapid rate. When I saw her last week I increased her Cardizem to 420 mg. She's not really feeling any palpitations. She thinks her breathing is okay. She denies any chest pressure, neck or arm discomfort. She continues to wear the oxygen most of the time. She is not describing any new PND or orthopnea. She has not been having any presyncope or syncope. She does think the higher dose of Cardizem might make her more fatigued.  Preventive Screening-Counseling & Management  Alcohol-Tobacco     Smoking Status: never  Current Medications (verified): 1)  Metformin Hcl 1000 Mg Tabs (Metformin Hcl) .Marland Kitchen.. 1 By Mouth Two Times A Day 2)  Levemir Flexpen 100 Unit/ml Soln (Insulin Detemir) .... As Directed 3)  Aspirin 81 Mg  Tabs (Aspirin) .Marland Kitchen.. 1 By Mouth Daily 4)  Xalatan 0.005 % Soln (Latanoprost) .... At Bedtime 5)  Vitamin D 1000 Unit Tabs (Cholecalciferol) .Marland Kitchen.. 1 By Mouth Daily 6)  Multivitamins   Tabs (Multiple Vitamin) .Marland Kitchen.. 1 By Mouth Daily 7)  Nitrostat 0.4 Mg Subl (Nitroglycerin) .... As Directed 8)  Isosorbide Mononitrate Cr 30 Mg Xr24h-Tab (Isosorbide Mononitrate) .... One Daily 9)  Furosemide 40 Mg Tabs (Furosemide) .... Take 1 Tablet By Mouth Two Times A Day 10)  Klor-Con M10 10 Meq Cr-Tabs (Potassium Chloride Crys Cr) .... Take 1 Tablet By Mouth Once A Day 11)  Glimepiride 4 Mg Tabs (Glimepiride) .... Two Times A Day 12)  Diltiazem Hcl Er Beads 420 Mg Xr24h-Cap (Diltiazem Hcl Er Beads) .... One By Mouth Daily 13)  Coumadin 5 Mg Tabs (Warfarin Sodium) .... Take 2 Tab By Mouth At Bedtime 14)  Digoxin 0.125 Mg Tabs (Digoxin) .... Take 1 Tablet By Mouth Once A Day 15)  Metoprolol 25 .... Take 1 Tablet By Mouth Two  Times A Day 16)  Pantoprazole Sodium 40 Mg Tbec (Pantoprazole Sodium) .... Take 1 Tablet By Mouth Once A Day  Allergies (verified): 1)  Prednisone  Comments:  Nurse/Medical Assistant: The patient's medications were reviewed with the patient and were updated in the Medication List. Pt brought a list of medications to office visit.  Cyril Loosen, RN, BSN (September 11, 2009 3:44 PM)  Past History:  Past Medical History: Reviewed history from 09/05/2009 and no changes required. OBESITY (ICD-278.00) PREMATURE ATRIAL CONTRACTIONS (ICD-427.61) DYSPNEA (ICD-786.05) COLONIC POLYPS, ADENOMATOUS, HX OF (ICD-V12.72) HYPERLIPIDEMIA (ICD-272.4) DIABETES MELLITUS (ICD-250.00) HYPERTENSION (ICD-401.9) Nonobstructive CAD( normal left        main, left anterior descending (coronary artery) with short long        proximal calcification with 40% stenosis, diagonal 1 large with        long proximal to mid 80% stenosis, circumflex long proximal 40-50%        stenosis, right coronary artery large dominant with long mid 20%        stenosis, posterior descending (coronary) artery large and normal.        Left ventricular ejection fraction 65%) Skin cancer on face (approx 10 years ago) Persistent atrial fibrillation (failed cardioversion with Tikosyn)  Past Surgical History: Reviewed history from 06/02/2009 and no changes required. Appendectomy Nasal surgery.   Social History: Smoking Status:  never  Review of Systems  As stated in the HPI and negative for all other systems.   Vital Signs:  Patient profile:   77 year old female Height:      63 inches Weight:      196.50 pounds Pulse rate:   90 / minute BP sitting:   136 / 69  (left arm) Cuff size:   regular  Vitals Entered By: Cyril Loosen, RN, BSN (September 11, 2009 3:41 PM) Comments Follow up visit   Physical Exam  General:  Well developed, well nourished, in no acute distress. Head:  normocephalic and atraumatic Eyes:   PERRLA/EOM intact; conjunctiva and lids normal. Mouth:  Teeth, gums and palate normal. Oral mucosa normal. Neck:  Neck supple, no JVD. No masses, thyromegaly or abnormal cervical nodes. Chest Wall:  no deformities or breast masses noted Lungs:  bilateral fine crackles one third the way up Abdomen:  Bowel sounds positive; abdomen soft and non-tender without masses, organomegaly, or hernias noted. No hepatosplenomegaly, obese Msk:  Back normal, normal gait. Muscle strength and tone normal. Extremities:  mild bilateral lower extremity edema to the knees left greater than right.   Detailed Cardiovascular Exam  Neck    Carotids: Carotids full and equal bilaterally without bruits.      Neck Veins: Normal, no JVD.    Heart    Inspection: no deformities or lifts noted.      Palpation: normal PMI with no thrills palpable.      Auscultation: iregular rate and rhythm, S1, S2 without murmurs, rubs, gallops, or clicks.    Vascular    Abdominal Aorta: no palpable masses, pulsations, or audible bruits.      Femoral Pulses: normal femoral pulses bilaterally.      Pedal Pulses: diminished right dorsalis pedis pulse, diminished right posterior tibial pulse, diminished left dorsalis pedis pulse, and diminished left posterior tibial pulse.      Radial Pulses: normal radial pulses bilaterally.      Peripheral Circulation: no clubbing, cyanosis, moderate left greater than right edema   Impression & Recommendations:  Problem # 1:  ATRIAL FIBRILLATION (ICD-427.31) The patient's rate seems to be better today. She just started the higher dose of Cardizem 3 days ago. Therefore, at this time I won't make any changes. I will continue with the meds as listed. However, I will see her back in 2 weeks as I suspect they will be making further adjustments and then following up with a Holter monitor.  Problem # 2:  CONGESTIVE HEART FAILURE (CHF) (ICD-428.0) She continues to have some mild lower extremity edema but  she is breathing well. Therefore, make the change to her regimen.  Patient Instructions: 1)  Your physician recommends that you schedule a follow-up appointment in: 10/22/09 @3 :00PM WITH DR. Zeenat Jeanbaptiste IN [place]. 2)  Your physician recommends that you continue on your current medications as directed. Please refer to the Current Medication list given to you today.

## 2010-07-28 NOTE — Procedures (Signed)
Summary: summary report  summary report   Imported By: Mirna Mires 11/12/2009 09:57:05  _____________________________________________________________________  External Attachment:    Type:   Image     Comment:   External Document

## 2010-07-28 NOTE — Letter (Signed)
Summary: Colonoscopy Letter  [facility]  [address] [place], [place] [address]   Phone: [phone]  Fax: [phone]      May 19, 2010 MRN: [id number]   JOANNAH GITLIN [address] [place], [place]  [address]   Dear Ms. GIEBLER,   According to your medical record, it is time for you to schedule a Colonoscopy. The American Cancer Society recommends this procedure as a method to detect early colon cancer. Patients with a family history of colon cancer, or a personal history of colon polyps or inflammatory bowel disease are at increased risk.  This letter has been generated based on the recommendations made at the time of your procedure. If you feel that in your particular situation this may no longer apply, please contact our office.  Please call our office at [phone] to schedule this appointment or to update your records at your earliest convenience.  Thank you for cooperating with [place] to provide you with the very best care possible.   Sincerely,  Rachael Fee, M.D.  [facility] [phone]

## 2010-07-28 NOTE — Miscellaneous (Signed)
Summary: PT Eval Order/[facility]  PT Eval Order/[facility]   Imported By: Sherian Rein 09/19/2009 12:25:16  _____________________________________________________________________  External Attachment:    Type:   Image     Comment:   External Document

## 2010-07-28 NOTE — Progress Notes (Signed)
Summary: SOB/ choking feeling  Phone Note Call from Patient Call back at Home Phone [phone]   Caller: Patient Call For: alva Summary of Call: pt c/o SOB- feels" like someone has their hands around her throat choking her". this has been going on for 3 days. also says that her stomach feels sore and "puffy" just above her belly button. hurts to touch as well as hurst when she sits down. she says she has not been coughing. pt says that she is still taking prednisone. today she took 2  5mg  tabs at 11:00 am. no red or pink spots on belly. pt has had "normal bowel movements".  Initial call taken by: Tivis Ringer, CNA,  August 06, 2009 3:27 PM  Follow-up for Phone Call        called, spoke with pt.  Pt states she has not felt good since incr prednisone.  c/o incr SOB with o2 on, states "it feels likehands are around my neck choking me."  states she has this feeling when she is doing activity.  also c/o abd tenderness on "top."  states these sxs have been going on x3days.  states she was seen by PCP this am and she informed her of these sxs.  I then asked the pt did she do anything about them and she said nothing was done.  I advised the pt RA does not have any openings today and it's in her best interest to go to urgent care or ED to be evaluated.  She then states " I'm alright right now"  and requested an appt with RA for tomorrow.  ov scheduled with RA for tomorrow-pt aware.  I advised pt if sxs become worse before appt tomorrow to call 911.  She verbalized understanding. Follow-up by: Gweneth Dimitri RN,  August 06, 2009 4:22 PM

## 2010-07-28 NOTE — Assessment & Plan Note (Signed)
Summary: appt @ 4:15/eph  Medications Added DILTIAZEM HCL ER BEADS 420 MG XR24H-CAP (DILTIAZEM HCL ER BEADS) one by mouth daily      Allergies Added: NKDA  Visit Type:  Follow-up Primary Provider:  Belva Agee, NP [facility])  CC:  Atrial fibrillation.  History of Present Illness: The patient presents for followup of atrial fibrillation and dyspnea. She was recently hospitalized with dyspnea that was felt to be exacerbation of her chronic lung disease plus volume overload related to diastolic dysfunction and atrial fibrillation. She diureses nicely and had improvement in her symptoms. Unfortunately she did not maintain sinus rhythm even after being loaded with Tikosyn and cardioverted.  After going home she had some trouble getting her Cardizem as it was not listed as generic.  She was off of this for several days.  She says that she is breathing better since her diuresis.  She does feel her heart racing at times but has had no presyncope or syncope. She's had no new chest discomfort. She's had no new PND or orthopnea. She's had some recurrent lower extremity swelling.  Current Medications (verified): 1)  Metformin Hcl 1000 Mg Tabs (Metformin Hcl) .Marland Kitchen.. 1 By Mouth Two Times A Day 2)  Levemir Flexpen 100 Unit/ml Soln (Insulin Detemir) .... As Directed 3)  Aspirin 81 Mg  Tabs (Aspirin) .Marland Kitchen.. 1 By Mouth Daily 4)  Xalatan 0.005 % Soln (Latanoprost) .... At Bedtime 5)  Vitamin D 1000 Unit Tabs (Cholecalciferol) .Marland Kitchen.. 1 By Mouth Daily 6)  Multivitamins   Tabs (Multiple Vitamin) .Marland Kitchen.. 1 By Mouth Daily 7)  Nitrostat 0.4 Mg Subl (Nitroglycerin) .... As Directed 8)  Isosorbide Mononitrate Cr 30 Mg Xr24h-Tab (Isosorbide Mononitrate) .... One Daily 9)  Furosemide 40 Mg Tabs (Furosemide) .... Take 1 Tablet By Mouth Two Times A Day 10)  Klor-Con M10 10 Meq Cr-Tabs (Potassium Chloride Crys Cr) .... Take 1 Tablet By Mouth Once A Day 11)  Glimepiride 4 Mg Tabs (Glimepiride) .... Two Times A  Day 12)  Cardizem Cd 360 Mg Xr24h-Cap (Diltiazem Hcl Coated Beads) .... Take 1 Capsule By Mouth Once A Day 13)  Coumadin 5 Mg Tabs (Warfarin Sodium) .... Take 2 Tab By Mouth At Bedtime 14)  Digoxin 0.125 Mg Tabs (Digoxin) .... Take 1 Tablet By Mouth Once A Day 15)  Metoprolol 25 .... Take 1 Tablet By Mouth Two Times A Day 16)  Pantoprazole Sodium 40 Mg Tbec (Pantoprazole Sodium) .... Take 1 Tablet By Mouth Once A Day  Allergies (verified): No Known Drug Allergies  Past History:  Past Medical History: OBESITY (ICD-278.00) PREMATURE ATRIAL CONTRACTIONS (ICD-427.61) DYSPNEA (ICD-786.05) COLONIC POLYPS, ADENOMATOUS, HX OF (ICD-V12.72) HYPERLIPIDEMIA (ICD-272.4) DIABETES MELLITUS (ICD-250.00) HYPERTENSION (ICD-401.9) Nonobstructive CAD( normal left        main, left anterior descending (coronary artery) with short long        proximal calcification with 40% stenosis, diagonal 1 large with        long proximal to mid 80% stenosis, circumflex long proximal 40-50%        stenosis, right coronary artery large dominant with long mid 20%        stenosis, posterior descending (coronary) artery large and normal.        Left ventricular ejection fraction 65%) Skin cancer on face (approx 10 years ago) Persistent atrial fibrillation (failed cardioversion with Tikosyn)  Review of Systems       As stated in the HPI and negative for all other systems.   Vital Signs:  Patient profile:   77 year old female Height:      63 inches Weight:      199 pounds BMI:     35.38 Pulse rate:   145 / minute Resp:     18 per minute BP sitting:   128 / 66  (right arm)  Vitals Entered By: Marrion Coy, CNA (September 05, 2009 4:06 PM)  Physical Exam  General:  Well developed, well nourished, in no acute distress. Head:  normocephalic and atraumatic Eyes:  PERRLA/EOM intact; conjunctiva and lids normal. Mouth:  Teeth, gums and palate normal. Oral mucosa normal. Neck:  Neck supple, no JVD. No masses,  thyromegaly or abnormal cervical nodes. Lungs:  bilateral fine crackles one third the way up Abdomen:  Bowel sounds positive; abdomen soft and non-tender without masses, organomegaly, or hernias noted. No hepatosplenomegaly, obese Msk:  Back normal, normal gait. Muscle strength and tone normal. Neurologic:  Alert and oriented x 3, resting tremor Skin:  Intact without lesions or rashes. Psych:  Normal affect.   Detailed Cardiovascular Exam  Neck    Carotids: Carotids full and equal bilaterally without bruits.      Neck Veins: Normal, no JVD.    Heart    Inspection: no deformities or lifts noted.      Palpation: normal PMI with no thrills palpable.      Auscultation: iregular rate and rhythm, S1, S2 without murmurs, rubs, gallops, or clicks.    Vascular    Abdominal Aorta: no palpable masses, pulsations, or audible bruits.      Pedal Pulses: diminished right dorsalis pedis pulse, diminished right posterior tibial pulse, diminished left dorsalis pedis pulse, and diminished left posterior tibial pulse.      Radial Pulses: normal radial pulses bilaterally.      Peripheral Circulation: no clubbing, cyanosis, moderate left greater than right edema   EKG  Procedure date:  09/05/2009  Findings:      atrial fibrillation with rapid ventricular rate, poor anterior R-wave progression, nonspecific T wave changes  Impression & Recommendations:  Problem # 1:  ATRIAL FIBRILLATION (ICD-427.31) The patient failed cardioversion and treatment with Tikosyn.  At this point I will increase the Cardizem to 420 mg daily.  I will need to see her next week to see if she has reasonable rate control.  She might need to have treatment with AV ablation and pacemaker or A. fib ablation ultimately. She remains on Coumadin. Orders: EKG w/ Interpretation (93000)  Problem # 2:  CONGESTIVE HEART FAILURE (CHF) (ICD-428.0) She actually seems to be euvolemic and will continue on the meds as listed.  Problem # 3:   PULMONARY FIBROSIS (ICD-515) Per per Dr. Vassie Loll.  Patient Instructions: 1)  Your physician recommends that you schedule a follow-up appointment in: [place] office in 1 week 2)  Your physician has recommended you make the following change in your medication:  Start Diltiazem 420 mg once a day 3)  You have been diagnosed with atrial fibrillation.  Atrial fibrillation is a condition in which one of the upper chambers of the heart has extra electrical cells causing it to beat very fast.  Please see the handout/brochure given to you today for further information. Prescriptions: DILTIAZEM HCL ER BEADS 420 MG XR24H-CAP (DILTIAZEM HCL ER BEADS) one by mouth daily  #30 x 11   Entered by:   Charolotte Capuchin, RN   Authorized by:   [name], MD, [facility]   Signed by:   Charolotte Capuchin, RN on 09/05/2009  Method used:   Electronically to        [facility] [phone]* (retail)       [address] [place], [place]  [address]       Ph: [phone] or [phone]       Fax: [phone]   RxID:   [id number]

## 2010-07-28 NOTE — Assessment & Plan Note (Signed)
Summary: [facility]      Allergies Added:   Visit Type:  Follow-up Primary Provider:  Belva Agee, NP [facility])  CC:  Atrial fibrillation.  History of Present Illness: The patient presents for followup of the above. At the last visit I made no med changes as she had just increased her Cardizem a few days before. She thinks her heart rate is slower and doesn't feel it racing or pounding like it was previously. She is not describing any presyncope or syncope. She is having less breathlessness than she had before. She does have mildly increased lower extremity swelling. She keeps her feet elevated as much as possible and I believe she is adherent with salt and fluid restriction.  Current Medications (verified): 1)  Metformin Hcl 1000 Mg Tabs (Metformin Hcl) .Marland Kitchen.. 1 By Mouth Two Times A Day 2)  Levemir Flexpen 100 Unit/ml Soln (Insulin Detemir) .... As Directed 3)  Aspirin 81 Mg  Tabs (Aspirin) .Marland Kitchen.. 1 By Mouth Daily 4)  Xalatan 0.005 % Soln (Latanoprost) .... At Bedtime 5)  Vitamin D 1000 Unit Tabs (Cholecalciferol) .Marland Kitchen.. 1 By Mouth Daily 6)  Multivitamins   Tabs (Multiple Vitamin) .Marland Kitchen.. 1 By Mouth Daily 7)  Nitrostat 0.4 Mg Subl (Nitroglycerin) .... As Directed 8)  Isosorbide Mononitrate Cr 30 Mg Xr24h-Tab (Isosorbide Mononitrate) .... One Daily 9)  Furosemide 40 Mg Tabs (Furosemide) .... Take 1 Tablet By Mouth Two Times A Day 10)  Klor-Con M10 10 Meq Cr-Tabs (Potassium Chloride Crys Cr) .... Take 1 Tablet By Mouth Once A Day 11)  Glimepiride 4 Mg Tabs (Glimepiride) .... Two Times A Day 12)  Diltiazem Hcl Er Beads 420 Mg Xr24h-Cap (Diltiazem Hcl Er Beads) .... One By Mouth Daily 13)  Coumadin 5 Mg Tabs (Warfarin Sodium) .... Take 2 Tab By Mouth At Bedtime 14)  Digoxin 0.125 Mg Tabs (Digoxin) .... Take 1 Tablet By Mouth Once A Day 15)  Metoprolol 25 .... Take 1 Tablet By Mouth Two Times A Day 16)  Pantoprazole Sodium 40 Mg Tbec (Pantoprazole Sodium) .... Take 1  Tablet By Mouth Once A Day  Allergies (verified): 1)  Prednisone  Past History:  Past Medical History: Reviewed history from 09/05/2009 and no changes required. OBESITY (ICD-278.00) PREMATURE ATRIAL CONTRACTIONS (ICD-427.61) DYSPNEA (ICD-786.05) COLONIC POLYPS, ADENOMATOUS, HX OF (ICD-V12.72) HYPERLIPIDEMIA (ICD-272.4) DIABETES MELLITUS (ICD-250.00) HYPERTENSION (ICD-401.9) Nonobstructive CAD( normal left        main, left anterior descending (coronary artery) with short long        proximal calcification with 40% stenosis, diagonal 1 large with        long proximal to mid 80% stenosis, circumflex long proximal 40-50%        stenosis, right coronary artery large dominant with long mid 20%        stenosis, posterior descending (coronary) artery large and normal.        Left ventricular ejection fraction 65%) Skin cancer on face (approx 10 years ago) Persistent atrial fibrillation (failed cardioversion with Tikosyn)  Past Surgical History: Reviewed history from 06/02/2009 and no changes required. Appendectomy Nasal surgery.   Review of Systems       As stated in the HPI and negative for all other systems.   Vital Signs:  Patient profile:   77 year old female Height:      63 inches Weight:      197 pounds BMI:     35.02 Pulse rate:   98 / minute Resp:  16 per minute BP sitting:   160 / 78  (right arm)  Vitals Entered By: Marrion Coy, CNA (October 22, 2009 3:20 PM)  Physical Exam  General:  Well developed, well nourished, in no acute distress. Head:  normocephalic and atraumatic Eyes:  PERRLA/EOM intact; conjunctiva and lids normal. Neck:  Neck supple, no JVD. No masses, thyromegaly or abnormal cervical nodes. Chest Wall:  no deformities or breast masses noted Lungs:  bilateral fine crackles one third the way up Abdomen:  Bowel sounds positive; abdomen soft and non-tender without masses, organomegaly, or hernias noted. No hepatosplenomegaly, obese Msk:  Back  normal, normal gait. Muscle strength and tone normal. Extremities:  mild bilateral lower extremity edema to the knees left greater than right. Neurologic:  Alert and oriented x 3, resting tremor Skin:  Intact without lesions or rashes. Psych:  Normal affect.   Detailed Cardiovascular Exam  Neck    Carotids: Carotids full and equal bilaterally without bruits.      Neck Veins: Normal, no JVD.    Heart    Inspection: no deformities or lifts noted.      Palpation: normal PMI with no thrills palpable.      Auscultation: iregular rate and rhythm, S1, S2 without murmurs, rubs, gallops, or clicks.    Vascular    Abdominal Aorta: no palpable masses, pulsations, or audible bruits.      Femoral Pulses: normal femoral pulses bilaterally.      Pedal Pulses: diminished right dorsalis pedis pulse, diminished right posterior tibial pulse, diminished left dorsalis pedis pulse, and diminished left posterior tibial pulse.      Radial Pulses: normal radial pulses bilaterally.      Peripheral Circulation: no clubbing, cyanosis, moderate left greater than right edema   Impression & Recommendations:  Problem # 1:  ATRIAL FIBRILLATION (ICD-427.31) Her ventricular rate seems to be better controlled. I will have her wear a 24 hour monitor to further evaluate this.  Problem # 2:  CONGESTIVE HEART FAILURE (CHF) (ICD-428.0) Her breathlessness is better. She will remain on the meds as listed.  Problem # 3:  EDEMA (ICD-782.3) Her lower extremity edema seems to be slightly increased. I described conservative strategies to try to treat this. If not I might need increased diuretics going forward.  Patient Instructions: 1)  Your physician recommends that you schedule a follow-up appointment in: 6 weeks with Dr Antoine Poche 2)  Your physician recommends that you continue on your current medications as directed. Please refer to the Current Medication list given to you today. 3)  Your physician has recommended that  you wear a holter monitor.  Holter monitors are medical devices that record the heart's electrical activity. Doctors most often use these monitors to diagnose arrhythmias. Arrhythmias are problems with the speed or rhythm of the heartbeat. The monitor is a small, portable device. You can wear one while you do your normal daily activities. This is usually used to diagnose what is causing palpitations/syncope (passing out).

## 2010-07-28 NOTE — Miscellaneous (Signed)
  Clinical Lists Changes  Observations: Added new observation of CARDCATHFIND:  Hemodynamics:  RA mean 14, RV 62/10 with a mean of 17, PA 60/19.  Pulmonary capillary pressure mean 28, LV 166/12, AO 165/67. Cardiac output/cardiac index (Fick) 4.46/2.3.   Coronary arteries:  Left main was short and normal.  The LAD was short with long proximal calcification with 40% stenosis.  First diagonal was large with long proximal-to-mid 80% stenosis.  The circumflex was essentially a large mid obtuse marginal.  There was long proximal 40-50% stenosis.  The right coronary artery was large and dominant.  There was long mid 20% stenosis.  The PDA was large and normal.   Left ventriculogram:  The left ventriculogram was obtained in the RAO projection.  The EF was 65% and normal.   CONCLUSION:  Single-vessel coronary artery disease in diagonal.  There is an elevated wedge and PA pressure, but no elevated left ventricular end-diastolic pressure.  There is no observed valvular regurgitation.   PLAN:  I will see the patient in the outpatient setting for transthoracic echo to further evaluate the elevated wedge pressure.  She may ultimately need pulmonary consultation.         [name], MD, [facility]       (07/23/2009 10:52)       Cardiac Cath  Procedure date:  07/23/2009  Findings:       Hemodynamics:  RA mean 14, RV 62/10 with a mean of 17, PA 60/19.  Pulmonary capillary pressure mean 28, LV 166/12, AO 165/67. Cardiac output/cardiac index (Fick) 4.46/2.3.   Coronary arteries:  Left main was short and normal.  The LAD was short with long proximal calcification with 40% stenosis.  First diagonal was large with long proximal-to-mid 80% stenosis.  The circumflex was essentially a large mid obtuse marginal.  There was long proximal 40-50% stenosis.  The right coronary artery was large and dominant.  There was long mid 20% stenosis.  The PDA was large and normal.   Left ventriculogram:   The left ventriculogram was obtained in the RAO projection.  The EF was 65% and normal.   CONCLUSION:  Single-vessel coronary artery disease in diagonal.  There is an elevated wedge and PA pressure, but no elevated left ventricular end-diastolic pressure.  There is no observed valvular regurgitation.   PLAN:  I will see the patient in the outpatient setting for transthoracic echo to further evaluate the elevated wedge pressure.  She may ultimately need pulmonary consultation.         [name], MD, [facility]

## 2010-07-28 NOTE — Miscellaneous (Signed)
Summary: PT orders/[facility]  PT orders/[facility]   Imported By: Sherian Rein 09/17/2009 13:37:44  _____________________________________________________________________  External Attachment:    Type:   Image     Comment:   External Document

## 2010-07-28 NOTE — Assessment & Plan Note (Signed)
Summary: NP follow up - pulm fibrosis   Copy to:  Dr. Antoine Poche Primary Provider/Referring Provider:  Belva Agee, NP [facility] Medical)  CC:  4 month follow up - states breathing is doing well and no new complaints..  History of Present Illness: 77/F never smoker for FU of ILD, coronary artery disease & diastolic congestive heart failure. She is dyspneic on walking short distances, insidious onset x 9 months, gradually progressive,  on O2 since 12/10.she has slept in a recliner x 3 years, c/o orthopnea &pedal edema  Work up so far - Echo - RVSP 35, EF 55-60%, grade 2 diastolic dysfunction Cath >> RA 14, RV 62/19, PA 60/19, PCWP 28, CI 2.3 PFTs >> moderate restriction, intraparenchymal, TLC 61%, DLCO 48%, FVC 52% ABG >> 7.36/61/75 when hospitalised 12/10 CXR s/o cardiomegaly with pulmonary edema but cannot r/o occult fibrosis. CT chest >> Mild air trapping suggests small airways disease, very mild subpleural fibrotic changes in the lower lobes,  4 mm right upper lobe nodule, Pulmonary arterial hypertension. ESR 31, TSH nml, RA neg, ANA neg DId not tolerate empiric steroid trial due to hyperglycemia. Hosp admission 2/15-28, diuresed well, failed cardioversion, dc'd on 80 mg lasix bid  September 05, 2009 2:57 PM  POst hosp visit, for some reason only taking lasix once daily & has started gaining weight again, remains in A-fibn & dyspneic on short distances. Compliant with O2, does not want to start pulm rehab, 'my golfing days are over..'  December 04, 2009 12:04 PM  wt 193, dyspne aat new baseline, c/o pedal edema on lasix 40 two times a day . Able to go short duration off O2  April 03, 2010 -Presents for 4 month follow up. She  states breathing is doing well. She is feeling so much better. Is going shopping today. Her edema is much better. Dyspnea is much less. No dyspnea at rest. Now can  walk to mail box and back without stopping. Checks O2 at home at rest, >92%. Uses during day as  needed and wears at bedtime. Denies chest pain,  orthopnea, hemoptysis, fever, n/v/d, edema, headache,recent travel or antibiotics.    Medications Prior to Update: 1)  Metformin Hcl 1000 Mg Tabs (Metformin Hcl) .Marland Kitchen.. 1 By Mouth Two Times A Day 2)  Levemir Flexpen 100 Unit/ml Soln (Insulin Detemir) .... As Directed 3)  Aspirin 81 Mg  Tabs (Aspirin) .Marland Kitchen.. 1 By Mouth Daily 4)  Xalatan 0.005 % Soln (Latanoprost) .... At Bedtime 5)  Vitamin D 1000 Unit Tabs (Cholecalciferol) .Marland Kitchen.. 1 By Mouth Daily 6)  Multivitamins   Tabs (Multiple Vitamin) .Marland Kitchen.. 1 By Mouth Daily 7)  Nitrostat 0.4 Mg Subl (Nitroglycerin) .... As Directed 8)  Isosorbide Mononitrate Cr 30 Mg Xr24h-Tab (Isosorbide Mononitrate) .... One Daily 9)  Furosemide 40 Mg Tabs (Furosemide) .... Take 1 Tablet By Mouth Two Times A Day 10)  Klor-Con M10 10 Meq Cr-Tabs (Potassium Chloride Crys Cr) .... Take 1 Tablet By Mouth Once A Day 11)  Glimepiride 4 Mg Tabs (Glimepiride) .... Two Times A Day 12)  Diltiazem Hcl Er Beads 420 Mg Xr24h-Cap (Diltiazem Hcl Er Beads) .... One By Mouth Daily 13)  Coumadin 5 Mg Tabs (Warfarin Sodium) .... Take 2 Tab By Mouth At Bedtime 14)  Digoxin 0.125 Mg Tabs (Digoxin) .... Take 1 Tablet By Mouth Once A Day 15)  Metoprolol Tartrate 25 Mg Tabs (Metoprolol Tartrate) .... Take One Tablet Two Times A Day 16)  Pantoprazole Sodium 40 Mg Tbec (Pantoprazole  Sodium) .... Take 1 Tablet By Mouth Once A Day As Needed  Current Medications (verified): 1)  Metformin Hcl 1000 Mg Tabs (Metformin Hcl) .Marland Kitchen.. 1 Tab By Mouth With Dinner 2)  Levemir Flexpen 100 Unit/ml Soln (Insulin Detemir) .... 25 Units Once Daily 3)  Aspirin 81 Mg  Tabs (Aspirin) .Marland Kitchen.. 1 By Mouth Daily 4)  Xalatan 0.005 % Soln (Latanoprost) .Marland Kitchen.. 1 Drop Each Eye At Bedtime 5)  Vitamin D 1000 Unit Tabs (Cholecalciferol) .Marland Kitchen.. 1 By Mouth Daily 6)  Multivitamins   Tabs (Multiple Vitamin) .Marland Kitchen.. 1 By Mouth Daily 7)  Nitrostat 0.4 Mg Subl (Nitroglycerin) .Marland Kitchen.. 1 Under Tongue  Every 5 Minutes X3 As Needed Chest Pain 8)  Isosorbide Mononitrate Cr 30 Mg Xr24h-Tab (Isosorbide Mononitrate) .... One Daily 9)  Furosemide 40 Mg Tabs (Furosemide) .Marland Kitchen.. 1-2 Tabs By Mouth Once Daily 10)  Klor-Con M10 10 Meq Cr-Tabs (Potassium Chloride Crys Cr) .... Take 1 Tablet By Mouth Once A Day 11)  Glimepiride 4 Mg Tabs (Glimepiride) .... Two Times A Day 12)  Diltiazem Hcl Er Beads 420 Mg Xr24h-Cap (Diltiazem Hcl Er Beads) .... One By Mouth Daily 13)  Coumadin 5 Mg Tabs (Warfarin Sodium) .... As As Directed By Dr. Antoine Poche 14)  Digoxin 0.125 Mg Tabs (Digoxin) .... Take 1 Tablet By Mouth Once A Day 15)  Metoprolol Tartrate 25 Mg Tabs (Metoprolol Tartrate) .... Take One Tablet Two Times A Day 16)  Oxygen .... Wear 2l/min Continuously and At Bedtime  Allergies (verified): 1)  Prednisone  Past History:  Past Medical History: Last updated: 09/05/2009 OBESITY (ICD-278.00) PREMATURE ATRIAL CONTRACTIONS (ICD-427.61) DYSPNEA (ICD-786.05) COLONIC POLYPS, ADENOMATOUS, HX OF (ICD-V12.72) HYPERLIPIDEMIA (ICD-272.4) DIABETES MELLITUS (ICD-250.00) HYPERTENSION (ICD-401.9) Nonobstructive CAD( normal left        main, left anterior descending (coronary artery) with short long        proximal calcification with 40% stenosis, diagonal 1 large with        long proximal to mid 80% stenosis, circumflex long proximal 40-50%        stenosis, right coronary artery large dominant with long mid 20%        stenosis, posterior descending (coronary) artery large and normal.        Left ventricular ejection fraction 65%) Skin cancer on face (approx 10 years ago) Persistent atrial fibrillation (failed cardioversion with Tikosyn)  Past Surgical History: Last updated: 06/02/2009 Appendectomy Nasal surgery.   Family History: Last updated: 07/18/2009  Noncontributory for early coronary artery disease.  She   does have later onset heart disease in her brother who is alive  with  stents.  Family History  Prostate Cancer-father Throat cancer-brother Family History Diabetes-mother, brothers  Social History: Last updated: 04/03/2010 The patient is a widow.  She has no children.  She  never smoked cigarettes, does not drink alcohol.  her husband died of Parkinson's in 11/30/2007 declines flu shot 04-03-10  Risk Factors: Smoking Status: never (09/11/2009)  Social History: The patient is a widow.  She has no children.  She  never smoked cigarettes, does not drink alcohol.  her husband died of Parkinson's in 11/30/07 declines flu shot 04-03-10  Review of Systems      See HPI  Vital Signs:  Patient profile:   77 year old female Height:      63 inches Weight:      178.25 pounds BMI:     31.69 O2 Sat:      100 % on 2 L/min cont Temp:  97.3 degrees F oral Pulse rate:   60 / minute BP sitting:   100 / 60  (left arm) Cuff size:   regular  Vitals Entered By: Boone Master CNA/MA (April 03, 2010 10:44 AM)  O2 Flow:  2 L/min cont CC: 4 month follow up - states breathing is doing well, no new complaints. Is Patient Diabetic? Yes Comments Medications reviewed with patient Daytime contact number verified with patient. Boone Master CNA/MA  April 03, 2010 10:43 AM    Physical Exam  Additional Exam:  Gen. Pleasant, well-nourished, in no distress, normal affect ENT - no lesions, no post nasal drip Neck: No JVD, no thyromegaly, no carotid bruits Lungs: no use of accessory muscles, no dullness to percussion, dry bibasal 1/3 crackles, no rhonchi  Cardiovascular: Rhythm regular, heart sounds  normal, no murmurs or gallops, 1+ peripheral edema Musculoskeletal: No deformities, no cyanosis or clubbing skin: clear w/ no rash.  Neuro: intact w/ no focal deficits noted.       Impression & Recommendations:  Problem # 1:  PULMONARY FIBROSIS (ICD-515) Well compensated w/ O2.  doing very well.  declined flu shot.   Problem # 2:  EDEMA (ICD-782.3) much improved on present regimen.   Orders: Est. Patient Level III (78295)  Problem # 3:  ATRIAL FIBRILLATION (ICD-427.31) controlled on meds.  today she is in sinus rhythm.  Her updated medication list for this problem includes:    Aspirin 81 Mg Tabs (Aspirin) .Marland Kitchen... 1 by mouth daily    Coumadin 5 Mg Tabs (Warfarin sodium) .Marland Kitchen... As as directed by dr. Antoine Poche    Digoxin 0.125 Mg Tabs (Digoxin) .Marland Kitchen... Take 1 tablet by mouth once a day    Metoprolol Tartrate 25 Mg Tabs (Metoprolol tartrate) .Marland Kitchen... Take one tablet two times a day  Medications Added to Medication List This Visit: 1)  Metformin Hcl 1000 Mg Tabs (Metformin hcl) .Marland Kitchen.. 1 tab by mouth with dinner 2)  Levemir Flexpen 100 Unit/ml Soln (Insulin detemir) .... 25 units once daily 3)  Xalatan 0.005 % Soln (Latanoprost) .Marland Kitchen.. 1 drop each eye at bedtime 4)  Nitrostat 0.4 Mg Subl (Nitroglycerin) .Marland Kitchen.. 1 under tongue every 5 minutes x3 as needed chest pain 5)  Furosemide 40 Mg Tabs (Furosemide) .Marland Kitchen.. 1-2 tabs by mouth once daily 6)  Coumadin 5 Mg Tabs (Warfarin sodium) .... As as directed by dr. Antoine Poche 7)  Oxygen  .... Wear 2l/min continuously and at bedtime  Complete Medication List: 1)  Metformin Hcl 1000 Mg Tabs (Metformin hcl) .Marland Kitchen.. 1 tab by mouth with dinner 2)  Levemir Flexpen 100 Unit/ml Soln (Insulin detemir) .... 25 units once daily 3)  Aspirin 81 Mg Tabs (Aspirin) .Marland Kitchen.. 1 by mouth daily 4)  Xalatan 0.005 % Soln (Latanoprost) .Marland Kitchen.. 1 drop each eye at bedtime 5)  Vitamin D 1000 Unit Tabs (Cholecalciferol) .Marland Kitchen.. 1 by mouth daily 6)  Multivitamins Tabs (Multiple vitamin) .Marland Kitchen.. 1 by mouth daily 7)  Nitrostat 0.4 Mg Subl (Nitroglycerin) .Marland Kitchen.. 1 under tongue every 5 minutes x3 as needed chest pain 8)  Isosorbide Mononitrate Cr 30 Mg Xr24h-tab (Isosorbide mononitrate) .... One daily 9)  Furosemide 40 Mg Tabs (Furosemide) .Marland Kitchen.. 1-2 tabs by mouth once daily 10)  Klor-con M10 10 Meq Cr-tabs (Potassium chloride crys cr) .... Take 1 tablet by mouth once a day 11)  Glimepiride 4 Mg  Tabs (Glimepiride) .... Two times a day 12)  Diltiazem Hcl Er Beads 420 Mg Xr24h-cap (Diltiazem hcl er beads) .... One by  mouth daily 13)  Coumadin 5 Mg Tabs (Warfarin sodium) .... As as directed by dr. Antoine Poche 14)  Digoxin 0.125 Mg Tabs (Digoxin) .... Take 1 tablet by mouth once a day 15)  Metoprolol Tartrate 25 Mg Tabs (Metoprolol tartrate) .... Take one tablet two times a day 16)  Oxygen  .... Wear 2l/min continuously and at bedtime  Patient Instructions: 1)  Continue on same meds.  2)  Continue on Oxygen.  3)  follow up Dr. Vassie Loll in 4 months and as needed

## 2010-07-28 NOTE — Progress Notes (Signed)
Summary: INR   Phone Note From Other Clinic Call back at [phone]   Caller: [facility] Summary of Call: INR 2.0 as of today Initial call taken by: Migdalia Dk,  August 27, 2009 10:52 AM  Follow-up for Phone Call        Reviewed d/c note and called Pam / [facility] in  [place].  Result is due to be followed at [facility] per D/C summary with primary care.  LMOM at phone number to return call to clinic to notify to call result there at 1120 am..mp Follow-up by: Shelby Dubin PharmD, BCPS, CPP,  August 27, 2009 11:32 AM  Additional Follow-up for Phone Call Additional follow up Details #1::        Discussed with Ron, RN at 1145.  He will forward these results to Belva Agee at [facility] -- with the BMP per d/c instructions.   Additional Follow-up by: Shelby Dubin PharmD, BCPS, CPP,  August 27, 2009 11:45 AM

## 2010-07-28 NOTE — Assessment & Plan Note (Signed)
Summary: rov 1 wk ///kp   Visit Type:  Follow-up Copy to:  Dr. Antoine Poche Primary Provider/Referring Provider:  Belva Agee, NP [facility] Medical)  CC:  Pt here for follow up.  History of Present Illness: 77/F never smoker for evaluation of dyspnea out of proportion to coronary artery disease & diastolic congestive heart failure. She is dyspneic on walking short distances, insidious onset x 9 months, gradually progressive, desaturated to 67% onarrival to the office today (left her O2 in the car ), on O2 since 06-19-2023. her husband died of Parkinson's in 20-Nov-2007, she hsa slept in a recliner x 3 years, c/o orthopnea.. C/o pedal edema when she does not take lasix. Lasix dose increased to 80 two times a day. Work up so far - Echo - RVSP 35, EF 55-60%, grade 2 diastolic dysfunction Cath >> RA 14, RV 62/19, PA 60/19, PCWP 28, CI 2.3 PFTs >> moderate restriction, intraparenchymal, TLC 61%, DLCO 48%, FVC 52% ABG >> 7.36/61/75 when hospitalised 06-19-23 CXR s/o cardiomegaly with pulmonary edema but cannot r/o occult fibrosis.  July 25, 2009 3:33 PM     Reviewed CT chest >>1. Mild air trapping suggests small airways disease. 2.  Respiratory motion degrades image quality.  Cannot exclude very mild subpleural fibrotic changes in the lower lobes. 3.  4 mm right upper lobe nodule.  4.  Pulmonary arterial hypertension. Detailed discussion regarding biopsy vs empiric treatment with pt & brother. ESR 31, TSH nml, RA neg  Current Medications (verified): 1)  Metformin Hcl 1000 Mg Tabs (Metformin Hcl) .Marland Kitchen.. 1 By Mouth Two Times A Day 2)  Levemir Flexpen 100 Unit/ml Soln (Insulin Detemir) .... As Directed 3)  Propranolol Hcl 60 Mg Tabs (Propranolol Hcl) .... 2 By Mouth Two Times A Day 4)  Amlodipine Besylate 10 Mg Tabs (Amlodipine Besylate) .Marland Kitchen.. 1 By Mouth Daily 5)  Aspirin 81 Mg  Tabs (Aspirin) .Marland Kitchen.. 1 By Mouth Daily 6)  Xalatan 0.005 % Soln (Latanoprost) .... At Bedtime 7)  Vitamin D 1000 Unit Tabs  (Cholecalciferol) .Marland Kitchen.. 1 By Mouth Daily 8)  Multivitamins   Tabs (Multiple Vitamin) .Marland Kitchen.. 1 By Mouth Daily 9)  Nitrostat 0.4 Mg Subl (Nitroglycerin) .... As Directed 10)  Isosorbide Mononitrate Cr 30 Mg Xr24h-Tab (Isosorbide Mononitrate) .... One Daily 11)  Furosemide 80 Mg Tabs (Furosemide) .... One Twice A Day 12)  Klor-Con M20 20 Meq Cr-Tabs (Potassium Chloride Crys Cr) .Marland Kitchen.. 1 By Mouth Daily  Allergies (verified): No Known Drug Allergies  Past History:  Past Medical History: Last updated: 07/18/2009 OBESITY (ICD-278.00) PREMATURE ATRIAL CONTRACTIONS (ICD-427.61) DYSPNEA (ICD-786.05) COLONIC POLYPS, ADENOMATOUS, HX OF (ICD-V12.72) HYPERLIPIDEMIA (ICD-272.4) DIABETES MELLITUS (ICD-250.00) HYPERTENSION (ICD-401.9) Nonobstructive CAD( normal left        main, left anterior descending (coronary artery) with short long        proximal calcification with 40% stenosis, diagonal 1 large with        long proximal to mid 80% stenosis, circumflex long proximal 40-50%        stenosis, right coronary artery large dominant with long mid 20%        stenosis, posterior descending (coronary) artery large and normal.        Left ventricular ejection fraction 65%)  Skin cancer on face (approx 10 years ago)  Social History: Last updated: 02/25/2009 The patient is a widow.  She has no children.  She  never smoked cigarettes, does not drink alcohol.      Review of Systems  The patient complains of weight gain and dyspnea on exertion.  The patient denies anorexia, fever, weight loss, vision loss, decreased hearing, hoarseness, chest pain, syncope, peripheral edema, prolonged cough, headaches, hemoptysis, abdominal pain, melena, hematochezia, severe indigestion/heartburn, hematuria, muscle weakness, difficulty walking, depression, unusual weight change, and abnormal bleeding.    Vital Signs:  Patient profile:   77 year old female Height:      63 inches Weight:      210.38 pounds O2 Sat:       96 % on 2 L/min Temp:     98.0 degrees F oral Pulse rate:   74 / minute BP sitting:   122 / 60  (left arm) Cuff size:   large  Vitals Entered By: Zackery Barefoot CMA (July 25, 2009 3:19 PM)  O2 Flow:  2 L/min CC: Pt here for follow up Comments Medications reviewed with patient Verified pt's contact number Zackery Barefoot CMA  July 25, 2009 3:20 PM    Physical Exam  Additional Exam:  Gen. Pleasant, well-nourished, in no distress, normal affect ENT - no lesions, no post nasal drip Neck: No JVD, no thyromegaly, no carotid bruits Lungs: no use of accessory muscles, no dullness to percussion, dry bibasal 1/3 crackles, no rhonchi  Cardiovascular: Rhythm regular, heart sounds  normal, no murmurs or gallops, 2 + peripheral edema Musculoskeletal: No deformities, no cyanosis or clubbing      CT of Chest  Procedure date:  07/18/2009  Findings:      IMPRESSION:   1.  Mild air trapping suggests small airways disease. 2.  Respiratory motion degrades image quality.  Cannot exclude very mild subpleural fibrotic changes in the lower lobes. 3.  4 mm right upper lobe nodule.  4.  Pulmonary arterial hypertension.  Impression & Recommendations:  Problem # 1:  PULMONARY FIBROSIS (ICD-515) The various options of biopsy including bronchoscopy and surgical biopsy were discussed.The risks of each procedure including coughing, bleeding and the  chances of lung puncture requiring chest tube were discussed in great detail. The benefits & alternatives including empiric therapy were also discussed.  Start on 5 mg prednisone once daily, if sugars stay ok increase to 10 mg >> will aim for ceiling of 20 mg if tolerated without side effects . She would like t think about the biopsy some more - I spent 30 mins explaining the risks & benefits to the pt & her brother/his wife.  Medications Added to Medication List This Visit: 1)  Prednisone 5 Mg Tabs (Prednisone) .Marland Kitchen.. 1 tab once daily x 1 wk then  2 tabs once daily  Other Orders: T-Antinuclear Antib (ANA) [phone]) TLB-TSH (Thyroid Stimulating Hormone) (84443-TSH) TLB-Rheumatoid Factor (RA) (65784-ON) TLB-Sedimentation Rate (ESR) (85652-ESR) Est. Patient Level IV (62952) Prescription Created Electronically [phone])  Patient Instructions: 1)  Copy sent GM:WNUUV Weeks, Hochrein 2)  Please schedule a follow-up appointment in 2 weeks. 3)  Start on 5 mg prednisone once daily, if sugars stay ok increase to 10 mg  4)  Start on exercise program Prescriptions: PREDNISONE 5 MG TABS (PREDNISONE) 1 tab once daily x 1 wk then 2 tabs once daily  #60 x 1   Entered and Authorized by:   Comer Locket Vassie Loll MD   Signed by:   Comer Locket Vassie Loll MD on 07/25/2009   Method used:   Electronically to        [facility] [phone]* (retail)       [address] [place]  [place], [place]  [address]       Ph: [phone] or [phone]       Fax: [phone]   RxID:   [id number]

## 2010-07-28 NOTE — Assessment & Plan Note (Signed)
Summary: sob cj   Copy to:  Dr. Antoine Poche Primary Provider/Referring Provider:  Belva Agee, NP [facility])  CC:  Acute Visit.  Pt states "I felt like I was going to choke to death yesterday."  having incr SOB with activity.  states she has to use o2 more since this week.  states these symptoms began when she increased prednisone to 10mg /day on Saturday.Marland Kitchen  History of Present Illness: 77/F never smoker for FU of dyspnea out of proportion to coronary artery disease & diastolic congestive heart failure. She is dyspneic on walking short distances, insidious onset x 9 months, gradually progressive, desaturated to 67% onarrival to the office today (left her O2 in the car ), on O2 since 06/26/2023. her husband died of Parkinson's in 11/27/2007, she hsa slept in a recliner x 3 years, c/o orthopnea.. C/o pedal edema when she does not take lasix. Lasix dose increased to 80 two times a day. Work up so far - Echo - RVSP 35, EF 55-60%, grade 2 diastolic dysfunction Cath >> RA 14, RV 62/19, PA 60/19, PCWP 28, CI 2.3 PFTs >> moderate restriction, intraparenchymal, TLC 61%, DLCO 48%, FVC 52% ABG >> 7.36/61/75 when hospitalised 06-26-23 CXR s/o cardiomegaly with pulmonary edema but cannot r/o occult fibrosis.  July 25, 2009 3:33 PM     Reviewed CT chest >>1. Mild air trapping suggests small airways disease. 2.  Respiratory motion degrades image quality.  Cannot exclude very mild subpleural fibrotic changes in the lower lobes. 3.  4 mm right upper lobe nodule.  4.  Pulmonary arterial hypertension. ESR 31, TSH nml, RA neg, ANA neg  August 07, 2009 1:41 PM  felt like I was going to choke to death yesterday."  having incr SOB with activity.  states she has to use o2 more since this week.  states these symptoms began when she increased prednisone to 10mg /day on Saturday. Detailed discussion regarding biopsy vs empiric treatment with pt & brother.  Current Medications (verified): 1)  Metformin Hcl 1000  Mg Tabs (Metformin Hcl) .Marland Kitchen.. 1 By Mouth Two Times A Day 2)  Levemir Flexpen 100 Unit/ml Soln (Insulin Detemir) .... As Directed 3)  Propranolol Hcl 60 Mg Tabs (Propranolol Hcl) .... 2 By Mouth Two Times A Day 4)  Amlodipine Besylate 10 Mg Tabs (Amlodipine Besylate) .Marland Kitchen.. 1 By Mouth Daily 5)  Aspirin 81 Mg  Tabs (Aspirin) .Marland Kitchen.. 1 By Mouth Daily 6)  Xalatan 0.005 % Soln (Latanoprost) .... At Bedtime 7)  Vitamin D 1000 Unit Tabs (Cholecalciferol) .Marland Kitchen.. 1 By Mouth Daily 8)  Multivitamins   Tabs (Multiple Vitamin) .Marland Kitchen.. 1 By Mouth Daily 9)  Nitrostat 0.4 Mg Subl (Nitroglycerin) .... As Directed 10)  Isosorbide Mononitrate Cr 30 Mg Xr24h-Tab (Isosorbide Mononitrate) .... One Daily 11)  Furosemide 80 Mg Tabs (Furosemide) .... One Twice A Day 12)  Klor-Con M20 20 Meq Cr-Tabs (Potassium Chloride Crys Cr) .Marland Kitchen.. 1 By Mouth Daily 13)  Prednisone 5 Mg Tabs (Prednisone) .Marland Kitchen.. 1 Tab Once Daily X 1 Wk Then 2 Tabs Once Daily 14)  Glimepiride 4 Mg Tabs (Glimepiride) .... Two Times A Day  Allergies (verified): No Known Drug Allergies  Past History:  Past Medical History: Last updated: 07/18/2009 OBESITY (ICD-278.00) PREMATURE ATRIAL CONTRACTIONS (ICD-427.61) DYSPNEA (ICD-786.05) COLONIC POLYPS, ADENOMATOUS, HX OF (ICD-V12.72) HYPERLIPIDEMIA (ICD-272.4) DIABETES MELLITUS (ICD-250.00) HYPERTENSION (ICD-401.9) Nonobstructive CAD( normal left        main, left anterior descending (coronary artery) with short long        proximal  calcification with 40% stenosis, diagonal 1 large with        long proximal to mid 80% stenosis, circumflex long proximal 40-50%        stenosis, right coronary artery large dominant with long mid 20%        stenosis, posterior descending (coronary) artery large and normal.        Left ventricular ejection fraction 65%)  Skin cancer on face (approx 10 years ago)  Social History: Last updated: 02/25/2009 The patient is a widow.  She has no children.  She  never smoked cigarettes,  does not drink alcohol.      Review of Systems       The patient complains of peripheral edema.  The patient denies anorexia, fever, weight loss, weight gain, vision loss, decreased hearing, hoarseness, chest pain, syncope, dyspnea on exertion, prolonged cough, headaches, hemoptysis, abdominal pain, melena, hematochezia, severe indigestion/heartburn, hematuria, muscle weakness, suspicious skin lesions, difficulty walking, depression, unusual weight change, and abnormal bleeding.    Vital Signs:  Patient profile:   77 year old female Height:      63 inches Weight:      219 pounds BMI:     38.93 O2 Sat:      97 % on 2 L/min Temp:     97.3 degrees F oral Pulse rate:   130 / minute BP sitting:   122 / 74  (left arm) Cuff size:   regular  Vitals Entered By: Gweneth Dimitri RN (August 07, 2009 1:33 PM)  O2 Flow:  2 L/min CC: Acute Visit.  Pt states "I felt like I was going to choke to death yesterday."  having incr SOB with activity.  states she has to use o2 more since this week.  states these symptoms began when she increased prednisone to 10mg /day on Saturday. Comments Medications reviewed with patient Daytime contact number verified with patient. Gweneth Dimitri RN  August 07, 2009 1:33 PM    Physical Exam  Additional Exam:  Gen. Pleasant, well-nourished, in no distress, normal affect ENT - no lesions, no post nasal drip Neck: No JVD, no thyromegaly, no carotid bruits Lungs: no use of accessory muscles, no dullness to percussion, dry bibasal 1/3 crackles, no rhonchi  Cardiovascular: Rhythm regular, heart sounds  normal, no murmurs or gallops, 2 + peripheral edema Musculoskeletal: No deformities, no cyanosis or clubbing      Impression & Recommendations:  Problem # 1:  PULMONARY FIBROSIS (ICD-515) Feel that empiric therapy would be better than pursuing a biopsy here, feel that morbidity from surgical procedure will be high & trans bronchial biopsy may not provide additional  info. Stay on 10 mg prednisone once daily, if sugars stay ok increase to 15 mg in 2 weeks>> will aim for ceiling of 20 mg if tolerated without side effects .  Problem # 2:  OBSTRUCTIVE SLEEP APNEA (ICD-780.57)  PUrsue PSg - apnealink or in lab sleeps in a recliner x 3 yrs.  Orders: Est. Patient Level IV (51884) Prescription Created Electronically [phone]) Sleep Disorder Referral (Sleep Disorder)  Problem # 3:  CONGESTIVE HEART FAILURE (CHF) (ICD-428.0)  ct lasix Her updated medication list for this problem includes:    Propranolol Hcl 60 Mg Tabs (Propranolol hcl) .Marland Kitchen... 2 by mouth two times a day    Aspirin 81 Mg Tabs (Aspirin) .Marland Kitchen... 1 by mouth daily    Furosemide 80 Mg Tabs (Furosemide) ..... One twice a day  Orders: Est. Patient Level IV (30160) Prescription Created Electronically (  [email or web address])  Medications Added to Medication List This Visit: 1)  Glimepiride 4 Mg Tabs (Glimepiride) .... Two times a day  Patient Instructions: 1)  Copy sent to: Belva Agee, Dr Antoine Poche 2)  Please schedule a follow-up appointment in 3 weeks. 3)  Stay on 10 m gprednisone, if sugars stay ok , increase to 15 mg in 2 weeks 4)  If sugars high, call Ms weeks to add another diabetes medication    Appended Document: sob cj pl let her know I would like her to undergo an overnight sleep study to see if CPPA machine will help her breathing  Appended Document: sob cj Per RA pt in hospital will address when she is d/c. jwr

## 2010-07-28 NOTE — Miscellaneous (Signed)
Summary: Plan of Care & Treatment/[facility]  Plan of Care & Treatment/[facility]   Imported By: Sherian Rein 09/17/2009 13:36:25  _____________________________________________________________________  External Attachment:    Type:   Image     Comment:   External Document

## 2010-07-28 NOTE — Miscellaneous (Signed)
  Clinical Lists Changes  Observations: Added new observation of HOLTERFIND: Overall ok, continue current meds (10/22/2009 9:53)      Holter Monitor  Procedure date:  10/22/2009  Findings:      Overall ok, continue current meds

## 2010-07-28 NOTE — Miscellaneous (Signed)
Summary: Plan of Care & treatment/[facility]  Plan of Care & treatment/[facility]   Imported By: Sherian Rein 09/19/2009 12:23:57  _____________________________________________________________________  External Attachment:    Type:   Image     Comment:   External Document

## 2010-07-28 NOTE — Assessment & Plan Note (Signed)
Summary: HFU PER CHRIS BURROWS/ CARDIO//KP   Visit Type:  Hospital Follow-up Copy to:  Dr. Antoine Poche Primary Provider/Referring Provider:  Belva Agee, NP [facility])  CC:  Pt here for hospital follow up. Pt c/o increased S.O.B, chest tightness, and and tired.  History of Present Illness: 79/F never smoker for FU of ILD, coronary artery disease & diastolic congestive heart failure. She is dyspneic on walking short distances, insidious onset x 9 months, gradually progressive,  on O2 since 12/10.she hsa slept in a recliner x 3 years, c/o orthopnea &pedal edema  Work up so far - Echo - RVSP 35, EF 55-60%, grade 2 diastolic dysfunction Cath >> RA 14, RV 62/19, PA 60/19, PCWP 28, CI 2.3 PFTs >> moderate restriction, intraparenchymal, TLC 61%, DLCO 48%, FVC 52% ABG >> 7.36/61/75 when hospitalised 12/10 CXR s/o cardiomegaly with pulmonary edema but cannot r/o occult fibrosis. CT chest >> Mild air trapping suggests small airways disease, very mild subpleural fibrotic changes in the lower lobes,  4 mm right upper lobe nodule, Pulmonary arterial hypertension. ESR 31, TSH nml, RA neg, ANA neg DId not tolerate empiric steroid trial due to hyperglycemia. Hosp admission 2/15-28, diuresed well, failed cardioversion, dc'd on 80 mg lasix bid  September 05, 2009 2:57 PM  POst hosp visit, for some reason only taking lasix once daily & has started gaining weight again, remains in A-fibn & dyspneic on short distances. Compliant with O2, does not want to start pulm rehab, 'my golfing days are over..'  Current Medications (verified): 1)  Metformin Hcl 1000 Mg Tabs (Metformin Hcl) .Marland Kitchen.. 1 By Mouth Two Times A Day 2)  Levemir Flexpen 100 Unit/ml Soln (Insulin Detemir) .... As Directed 3)  Aspirin 81 Mg  Tabs (Aspirin) .Marland Kitchen.. 1 By Mouth Daily 4)  Xalatan 0.005 % Soln (Latanoprost) .... At Bedtime 5)  Vitamin D 1000 Unit Tabs (Cholecalciferol) .Marland Kitchen.. 1 By Mouth Daily 6)  Multivitamins   Tabs (Multiple  Vitamin) .Marland Kitchen.. 1 By Mouth Daily 7)  Nitrostat 0.4 Mg Subl (Nitroglycerin) .... As Directed 8)  Isosorbide Mononitrate Cr 30 Mg Xr24h-Tab (Isosorbide Mononitrate) .... One Daily 9)  Furosemide 40 Mg Tabs (Furosemide) .... Take 1 Tablet By Mouth Two Times A Day 10)  Klor-Con M10 10 Meq Cr-Tabs (Potassium Chloride Crys Cr) .... Take 1 Tablet By Mouth Once A Day 11)  Glimepiride 4 Mg Tabs (Glimepiride) .... Two Times A Day 12)  Cardizem Cd 360 Mg Xr24h-Cap (Diltiazem Hcl Coated Beads) .... Take 1 Capsule By Mouth Once A Day 13)  Coumadin 5 Mg Tabs (Warfarin Sodium) .... Take 2 Tab By Mouth At Bedtime 14)  Digoxin 0.125 Mg Tabs (Digoxin) .... Take 1 Tablet By Mouth Once A Day 15)  Metoprolol 25 .... Take 1 Tablet By Mouth Two Times A Day 16)  Pantoprazole Sodium 40 Mg Tbec (Pantoprazole Sodium) .... Take 1 Tablet By Mouth Once A Day  Allergies (verified): No Known Drug Allergies  Past History:  Past Medical History: Last updated: 07/18/2009 OBESITY (ICD-278.00) PREMATURE ATRIAL CONTRACTIONS (ICD-427.61) DYSPNEA (ICD-786.05) COLONIC POLYPS, ADENOMATOUS, HX OF (ICD-V12.72) HYPERLIPIDEMIA (ICD-272.4) DIABETES MELLITUS (ICD-250.00) HYPERTENSION (ICD-401.9) Nonobstructive CAD( normal left        main, left anterior descending (coronary artery) with short long        proximal calcification with 40% stenosis, diagonal 1 large with        long proximal to mid 80% stenosis, circumflex long proximal 40-50%        stenosis, right coronary artery  large dominant with long mid 20%        stenosis, posterior descending (coronary) artery large and normal.        Left ventricular ejection fraction 65%)  Skin cancer on face (approx 10 years ago)  Social History: The patient is a widow.  She has no children.  She  never smoked cigarettes, does not drink alcohol.  her husband died of Parkinson's in 2007-11-21     Review of Systems       The patient complains of dyspnea on exertion and peripheral edema.   The patient denies anorexia, fever, weight loss, weight gain, vision loss, decreased hearing, hoarseness, chest pain, syncope, prolonged cough, headaches, hemoptysis, abdominal pain, melena, hematochezia, severe indigestion/heartburn, hematuria, muscle weakness, suspicious skin lesions, difficulty walking, depression, unusual weight change, and abnormal bleeding.    Vital Signs:  Patient profile:   77 year old female Height:      63 inches Weight:      201.50 pounds O2 Sat:      97 % on 2 L/min Temp:     98.1 degrees F oral Pulse rate:   92 / minute BP sitting:   120 / 72  (left arm) Cuff size:   large  Vitals Entered By: Zackery Barefoot CMA (September 05, 2009 2:27 PM)  O2 Flow:  2 L/min CC: Pt here for hospital follow up. Pt c/o increased S.O.B, chest tightness, and tired Comments Medications reviewed with patient Verified contact number and pharmacy with patient Zackery Barefoot CMA  September 05, 2009 2:30 PM    Physical Exam  Additional Exam:  Gen. Pleasant, well-nourished, in no distress, normal affect ENT - no lesions, no post nasal drip Neck: No JVD, no thyromegaly, no carotid bruits Lungs: no use of accessory muscles, no dullness to percussion, dry bibasal 1/3 crackles, no rhonchi  Cardiovascular: Rhythm regular, heart sounds  normal, no murmurs or gallops, 2 + peripheral edema Musculoskeletal: No deformities, no cyanosis or clubbing      Impression & Recommendations:  Problem # 1:  PULMONARY FIBROSIS (ICD-515) ct 2 L O2 during rest & sleep May need more on exertion. Failed empiric trial of steroids  Problem # 2:  OBSTRUCTIVE SLEEP APNEA (ICD-780.57)  Did not tolerate PAP therapy in the hospital. She is not interested in pursuing a sleep study  Orders: Est. Patient Level IV ([address]) Sleep Disorder Referral (Sleep Disorder)  Problem # 3:  CONGESTIVE HEART FAILURE (CHF) (ICD-428.0)  -lasix 80 two times a day per cards, aim for wt around 195 lbs The following  medications were removed from the medication list:    Propranolol Hcl 60 Mg Tabs (Propranolol hcl) .Marland Kitchen... 2 by mouth two times a day Her updated medication list for this problem includes:    Aspirin 81 Mg Tabs (Aspirin) .Marland Kitchen... 1 by mouth daily    Furosemide 40 Mg Tabs (Furosemide) .Marland Kitchen... Take 1 tablet by mouth two times a day    Coumadin 5 Mg Tabs (Warfarin sodium) .Marland Kitchen... Take 2 tab by mouth at bedtime    Digoxin 0.125 Mg Tabs (Digoxin) .Marland Kitchen... Take 1 tablet by mouth once a day  Orders: Est. Patient Level IV ([address])  Medications Added to Medication List This Visit: 1)  Furosemide 40 Mg Tabs (Furosemide) .... Take 1 tablet by mouth two times a day 2)  Klor-con M10 10 Meq Cr-tabs (Potassium chloride crys cr) .... Take 1 tablet by mouth once a day 3)  Cardizem Cd 360 Mg Xr24h-cap (Diltiazem hcl coated  beads) .... Take 1 capsule by mouth once a day 4)  Coumadin 5 Mg Tabs (Warfarin sodium) .... Take 2 tab by mouth at bedtime 5)  Digoxin 0.125 Mg Tabs (Digoxin) .... Take 1 tablet by mouth once a day 6)  Metoprolol 25  .... Take 1 tablet by mouth two times a day 7)  Pantoprazole Sodium 40 Mg Tbec (Pantoprazole sodium) .... Take 1 tablet by mouth once a day  Patient Instructions: 1)  Copy sent to: Dr Antoine Poche 2)  Please schedule a follow-up appointment in 3 months. 3)  Aim for a WT of 195 lbs 4)  STay on Lasix 80 two times a day  5)  Let me know if you are interested in pulmonary rehab program

## 2010-07-28 NOTE — Progress Notes (Signed)
Summary: monitor results   Phone Note Outgoing Call   Call placed by: Charolotte Capuchin, RN,  Nov 11, 2009 5:24 PM Call placed to: Patient Details for Reason: results of monitor Summary of Call: pt was called with the results of her monitor.  "Occiasional brief paroxysm of measured HR, overall ok, continue current meds"  hand written on monitor results by Dr Antoine Poche. Initial call taken by: Charolotte Capuchin, RN,  Nov 11, 2009 5:26 PM

## 2010-07-30 NOTE — Letter (Signed)
Summary: New Patient letter  [facility]  [address] [place], [place] [address]   Phone: [phone]  Fax: [phone]       07/08/2010 MRN: [id number]  Alexandria [name] [address] [place], [place]  [address]  Dear Ms. Alexandria [name],  Welcome to the Gastroenterology Division at [facility].    You are scheduled to see Dr.  Rob Bunting on August 25, 2010 at 10:30am on the 3rd floor at [facility], [address]  We ask that you try to arrive at our office 15 minutes prior to your appointment time to allow for check-in.  We would like you to complete the enclosed self-administered evaluation form prior to your visit and bring it with you on the day of your appointment.  We will review it with you.  Also, please bring a complete list of all your medications or, if you prefer, bring the medication bottles and we will list them.  Please bring your insurance card so that we may make a copy of it.  If your insurance requires a referral to see a specialist, please bring your referral form from your primary care physician.  Co-payments are due at the time of your visit and may be paid by cash, check or credit card.     Your office visit will consist of a consult with your physician (includes a physical exam), any laboratory testing he/she may order, scheduling of any necessary diagnostic testing (e.g. x-ray, ultrasound, CT-scan), and scheduling of a procedure (e.g. Endoscopy, Colonoscopy) if required.  Please allow enough time on your schedule to allow for any/all of these possibilities.    If you cannot keep your appointment, please call [phone] to cancel or reschedule prior to your appointment date.  This allows [place] the opportunity to schedule an appointment for another patient in need of care.  If you do not cancel or reschedule by 5 p.m. the business day prior to your appointment date, you will be charged a $50.00 late cancellation/no-show fee.    Thank you  for choosing [facility] for your medical needs.  We appreciate the opportunity to care for you.  Please visit [place] at our website  to learn more about our practice.                     Sincerely,                                                             The Gastroenterology Division

## 2010-08-04 ENCOUNTER — Encounter: Payer: Self-pay | Admitting: Pulmonary Disease

## 2010-08-04 ENCOUNTER — Ambulatory Visit (INDEPENDENT_AMBULATORY_CARE_PROVIDER_SITE_OTHER)
Admission: RE | Admit: 2010-08-04 | Discharge: 2010-08-04 | Disposition: A | Discharge status: Home / Self Care | Payer: Medicare Other | Source: Ambulatory Visit | Attending: Pulmonary Disease | Admitting: Pulmonary Disease

## 2010-08-04 ENCOUNTER — Other Ambulatory Visit: Payer: Self-pay | Admitting: Pulmonary Disease

## 2010-08-04 ENCOUNTER — Ambulatory Visit (INDEPENDENT_AMBULATORY_CARE_PROVIDER_SITE_OTHER): Payer: Medicare Other | Admitting: Pulmonary Disease

## 2010-08-04 DIAGNOSIS — J150 Pneumonia due to Klebsiella pneumoniae: Secondary | ICD-10-CM

## 2010-08-04 DIAGNOSIS — I509 Heart failure, unspecified: Secondary | ICD-10-CM

## 2010-08-04 DIAGNOSIS — J8410 Pulmonary fibrosis, unspecified: Secondary | ICD-10-CM

## 2010-08-13 NOTE — Assessment & Plan Note (Signed)
Summary: ROV 4 MONTHS   Visit Type:  Follow-up Copy to:  Dr. Antoine Poche Primary Provider/Referring Provider:  Belva Agee, NP [facility])  CC:  4  month follow up. No new complaints. Pt states breathing is better.  History of Present Illness: 77/F never smoker for FU of ILD, coronary artery disease & diastolic congestive heart failure. She is dyspneic on walking short distances, insidious onset x 9 months, gradually progressive,  on O2 since 12/10.she has slept in a recliner x 3 years, c/o orthopnea &pedal edema  Work up so far - Echo - RVSP 35, EF 55-60%, grade 2 diastolic dysfunction Cath >> RA 14, RV 62/19, PA 60/19, PCWP 28, CI 2.3 PFTs >> moderate restriction, intraparenchymal, TLC 61%, DLCO 48%, FVC 52% ABG >> 7.36/61/75 when hospitalised 12/10 CXR s/o cardiomegaly with pulmonary edema but cannot r/o occult fibrosis. CT chest jan'11 >> Mild air trapping suggests small airways disease, very mild subpleural fibrotic changes in the lower lobes,  4 mm right upper lobe nodule, Pulmonary arterial hypertension. ESR 31, TSH nml, RA neg, ANA neg DId not tolerate empiric steroid trial due to hyperglycemia. Hosp admission 2/15-28/11, diuresed well, failed cardioversion, dc'd on 80 mg lasix two times a day   August 04, 2010 10:53 AM  - remarkably improved Her edema is much better, down to lasix qd . Dyspnea is much less. No dyspnea at rest. Now can  walk to mail box and back without stopping. Checks O2 at home at rest, >92%. Uses during day as needed and wears at bedtime. Denies chest pain,  orthopnea, hemoptysis, fever, n/v/d, edema, headache,recent travel or antibiotics.    Preventive Screening-Counseling & Management  Alcohol-Tobacco     Smoking Status: never  Current Medications (verified): 1)  Metformin Hcl 1000 Mg Tabs (Metformin Hcl) .Marland Kitchen.. 1 Tab By Mouth With Dinner 2)  Levemir Flexpen 100 Unit/ml Soln (Insulin Detemir) .Marland Kitchen.. 14 Units Every Morning and 24 Units Every  Evening 3)  Aspirin 81 Mg  Tabs (Aspirin) .Marland Kitchen.. 1 By Mouth Daily 4)  Xalatan 0.005 % Soln (Latanoprost) .Marland Kitchen.. 1 Drop Each Eye At Bedtime 5)  Vitamin D 1000 Unit Tabs (Cholecalciferol) .Marland Kitchen.. 1 By Mouth Daily 6)  Multivitamins   Tabs (Multiple Vitamin) .Marland Kitchen.. 1 By Mouth Daily 7)  Nitrostat 0.4 Mg Subl (Nitroglycerin) .Marland Kitchen.. 1 Under Tongue Every 5 Minutes X3 As Needed Chest Pain 8)  Isosorbide Mononitrate Cr 30 Mg Xr24h-Tab (Isosorbide Mononitrate) .... One Daily 9)  Furosemide 40 Mg Tabs (Furosemide) .Marland Kitchen.. 1 Tabs By Mouth Once Daily 10)  Klor-Con M10 10 Meq Cr-Tabs (Potassium Chloride Crys Cr) .... Take 1 Tablet By Mouth Once A Day 11)  Glimepiride 4 Mg Tabs (Glimepiride) .... Two Times A Day 12)  Diltiazem Hcl Er Beads 420 Mg Xr24h-Cap (Diltiazem Hcl Er Beads) .... One By Mouth Daily 13)  Coumadin 5 Mg Tabs (Warfarin Sodium) .... As As Directed By Dr. Antoine Poche 14)  Digoxin 0.125 Mg Tabs (Digoxin) .... Take 1 Tablet By Mouth Once A Day 15)  Metoprolol Tartrate 25 Mg Tabs (Metoprolol Tartrate) .... Take One Tablet Two Times A Day 16)  Oxygen .... Wear 2l/min Continuously and At Bedtime 17)  Protonix 40 Mg Solr (Pantoprazole Sodium) .Marland Kitchen.. 1 By Mouth Daily As Needed  Allergies (verified): 1)  Prednisone  Past History:  Past Medical History: Last updated: 09/05/2009 OBESITY (ICD-278.00) PREMATURE ATRIAL CONTRACTIONS (ICD-427.61) DYSPNEA (ICD-786.05) COLONIC POLYPS, ADENOMATOUS, HX OF (ICD-V12.72) HYPERLIPIDEMIA (ICD-272.4) DIABETES MELLITUS (ICD-250.00) HYPERTENSION (ICD-401.9) Nonobstructive CAD( normal left  main, left anterior descending (coronary artery) with short long        proximal calcification with 40% stenosis, diagonal 1 large with        long proximal to mid 80% stenosis, circumflex long proximal 40-50%        stenosis, right coronary artery large dominant with long mid 20%        stenosis, posterior descending (coronary) artery large and normal.        Left ventricular  ejection fraction 65%) Skin cancer on face (approx 10 years ago) Persistent atrial fibrillation (failed cardioversion with Tikosyn)  Social History: Last updated: 04/03/2010 The patient is a widow.  She has no children.  She  never smoked cigarettes, does not drink alcohol.  her husband died of Parkinson's in 11/24/2007 declines flu shot 04-03-10  Review of Systems       The patient complains of weight loss.  The patient denies anorexia, fever, weight gain, vision loss, decreased hearing, hoarseness, chest pain, syncope, dyspnea on exertion, peripheral edema, prolonged cough, headaches, hemoptysis, abdominal pain, melena, hematochezia, severe indigestion/heartburn, hematuria, muscle weakness, suspicious skin lesions, difficulty walking, depression, unusual weight change, abnormal bleeding, enlarged lymph nodes, and angioedema.    Vital Signs:  Patient profile:   77 year old female Height:      63 inches Weight:      172.8 pounds BMI:     30.72 O2 Sat:      91 % on Room air Temp:     98.1 degrees F oral Pulse rate:   79 / minute BP sitting:   142 / 70  (left arm) Cuff size:   regular  Vitals Entered By: Zackery Barefoot CMA (August 04, 2010 10:24 AM)  O2 Flow:  Room air  Serial Vital Signs/Assessments:  Comments: Ambulatory Pulse Oximetry  Resting; HR_71____    02 Sat_96%RA____  Lap1 (185 feet)   HR_93____   02 Sat_92%RA____ Lap2 (185 feet)   HR__99___   02 Sat_90%RA____    Lap3 (185 feet)   HR_102____   02 Sat__89%RA___  _X__Test Completed without Difficulty ___Test Stopped due to: Renold Genta RCP, LPN  August 04, 2010 11:06 AM   By: Renold Genta RCP, LPN   CC: 4  month follow up. No new complaints. Pt states breathing is better Comments Medications reviewed with patient Verified contact number and pharmacy with patient Zackery Barefoot [facility]  August 04, 2010 10:26 AM    Physical Exam  Additional Exam:  wt 173 August 04, 2010  Gen. Pleasant,  well-nourished, in no distress, normal affect ENT - no lesions, no post nasal drip Neck: No JVD, no thyromegaly, no carotid bruits Lungs: no use of accessory muscles, no dullness to percussion, dry bibasal 1/3 crackles, no rhonchi  Cardiovascular: Rhythm regular, heart sounds  normal, no murmurs or gallops, 1+ peripheral edema Musculoskeletal: No deformities, no cyanosis or clubbing skin: clear w/ no rash.        CXR  Procedure date:  08/04/2010  Findings:      IMPRESSION: Stable appearance of the chest from prior exam.  No acute abnormality is seen.  Impression & Recommendations:  Problem # 1:  PULMONARY FIBROSIS (ICD-515) Her dyspne ais remarkably improved s is her hypoxia, undoubtedly this is related to diuresis & wt loss. CXR continues to suggest some degree of fibrosis . Given clinical improvement, would ct to observe She did not desaturate < 88% on exertion,  hence OK to wean off O2  daytime. PFTs next visit  Problem # 2:  CONGESTIVE HEART FAILURE (CHF) (ICD-428.0) -much improved Her updated medication list for this problem includes:    Aspirin 81 Mg Tabs (Aspirin) .Marland Kitchen... 1 by mouth daily    Furosemide 40 Mg Tabs (Furosemide) .Marland Kitchen... 1 tabs by mouth once daily    Coumadin 5 Mg Tabs (Warfarin sodium) .Marland Kitchen... As as directed by dr. Antoine Poche    Digoxin 0.125 Mg Tabs (Digoxin) .Marland Kitchen... Take 1 tablet by mouth once a day    Metoprolol Tartrate 25 Mg Tabs (Metoprolol tartrate) .Marland Kitchen... Take one tablet two times a day  Orders: Est. Patient Level III (45409) T-2 View CXR (71020TC)  Medications Added to Medication List This Visit: 1)  Levemir Flexpen 100 Unit/ml Soln (Insulin detemir) .Marland Kitchen.. 14 units every morning and 24 units every evening 2)  Furosemide 40 Mg Tabs (Furosemide) .Marland Kitchen.. 1 tabs by mouth once daily 3)  Protonix 40 Mg Solr (Pantoprazole sodium) .Marland Kitchen.. 1 by mouth daily as needed  Other Orders: Pulmonary Referral (Pulmonary)  Patient Instructions: 1)  Copy sent to: Susan weeks  NP, [place] 2)  Please schedule a follow-up appointment in 4 months. 3)  A chest x-ray has been recommended.  Your imaging study may require preauthorization.  4)  Ambulatory satn on RA

## 2010-08-25 ENCOUNTER — Encounter: Payer: Self-pay | Admitting: Gastroenterology

## 2010-08-25 ENCOUNTER — Ambulatory Visit (INDEPENDENT_AMBULATORY_CARE_PROVIDER_SITE_OTHER): Payer: Medicare Other | Admitting: Gastroenterology

## 2010-08-25 DIAGNOSIS — K5900 Constipation, unspecified: Secondary | ICD-10-CM

## 2010-08-25 DIAGNOSIS — Z86010 Personal history of colonic polyps: Secondary | ICD-10-CM

## 2010-09-03 NOTE — Assessment & Plan Note (Signed)
Review of gastrointestinal problems: 1.  Adenomatous colon polyp, removed 2008. Was recommended to have a recall colonoscopy at 3 year interval   History of Present Illness Visit Type: new patient  Primary GI MD: Rob Bunting MD Primary Provider: Belva Agee, NP [facility]) Requesting Provider: na Chief Complaint: consult colon  History of Present Illness:       very pleasant 77 year old woman whom I last saw upon a colonoscopy in 2008.  colonoscopy 2008, adenomas removed, was told to have repeat colonoscopy at 3 year interval.    about a year ago She had what sounds like rapid afib.    She has been on coumadin for about a year now.  she has been very fatigued lot.  She has oxygen at home, only uses it while sleeping now.   she has well documented pulmonary fibrosis.           Current Medications (verified): 1)  Metformin Hcl 1000 Mg Tabs (Metformin Hcl) .Marland Kitchen.. 1 Tab By Mouth With Dinner 2)  Levemir Flexpen 100 Unit/ml Soln (Insulin Detemir) .Marland Kitchen.. 14 Units Every Morning and 24 Units Every Evening 3)  Aspirin 81 Mg  Tabs (Aspirin) .Marland Kitchen.. 1 By Mouth Daily 4)  Xalatan 0.005 % Soln (Latanoprost) .Marland Kitchen.. 1 Drop Each Eye At Bedtime 5)  Vitamin D 1000 Unit Tabs (Cholecalciferol) .Marland Kitchen.. 1 By Mouth Daily 6)  Multivitamins   Tabs (Multiple Vitamin) .Marland Kitchen.. 1 By Mouth Daily 7)  Nitrostat 0.4 Mg Subl (Nitroglycerin) .Marland Kitchen.. 1 Under Tongue Every 5 Minutes X3 As Needed Chest Pain 8)  Isosorbide Mononitrate Cr 30 Mg Xr24h-Tab (Isosorbide Mononitrate) .... One Daily 9)  Furosemide 40 Mg Tabs (Furosemide) .Marland Kitchen.. 1 Tabs By Mouth Once Daily 10)  Klor-Con M10 10 Meq Cr-Tabs (Potassium Chloride Crys Cr) .... Take 1 Tablet By Mouth Once A Day 11)  Glimepiride 4 Mg Tabs (Glimepiride) .... Two Times A Day 12)  Diltiazem Hcl Er Beads 420 Mg Xr24h-Cap (Diltiazem Hcl Er Beads) .... One By Mouth Daily 13)  Coumadin 5 Mg Tabs (Warfarin Sodium) .... As As Directed By Dr. Antoine Poche 14)  Digoxin 0.125 Mg Tabs  (Digoxin) .... Take 1 Tablet By Mouth Once A Day 15)  Metoprolol Tartrate 25 Mg Tabs (Metoprolol Tartrate) .... Take One Tablet Two Times A Day 16)  Oxygen .... Wear 2l/min Continuously and At Bedtime 17)  Vita-Plus E 400 Unit Caps (Vitamin E) .... One Capsule By Mouth Once Daily  Allergies (verified): 1)  Prednisone  Vital Signs:  Patient profile:   77 year old female Height:      63 inches Weight:      167 pounds BMI:     29.69 BSA:     1.79 Pulse rate:   82 / minute Pulse rhythm:   regular BP sitting:   128 / 74  (left arm) Cuff size:   regular  Vitals Entered By: Ok Anis CMA (August 25, 2010 10:43 AM)  Physical Exam  Additional Exam:  Constitutional: generally well appearing Psychiatric: alert and oriented times 3 Abdomen: soft, non-tender, non-distended, normal bowel sounds    Impression & Recommendations:  Problem # 1:   History of adenomatous colon polyps  polyp surveillance, colorectal cancer screening usually stops around the age of 7-80. since her last colonoscopy she has been put on home oxygen  4 pulmonary fibrosis as well as Coumadin 4 rapid atrial fibrillation.   I do not think we need to repeat colonoscopy at this point. I feel fairly comfortable knowing  that  she is very unlikely to have colon cancer now or in the future.  Patient Instructions: 1)  You should begin taking citrucel powder fiber supplement (orange flavor).  Start with a small spoonful and increase this over 1 week to a full, heaping spoonful daily.  You may notice some bloating when you first start the fiber, but that usually resolves after a few days. 2)  Call Dr. Christella Hartigan with any changes in bowel habits or significant bleeding. 3)  A copy of this information will be sent to Dr. Christell Constant. 4)  The medication list was reviewed and reconciled.  All changed / newly prescribed medications were explained.  A complete medication list was provided to the patient / caregiver.

## 2010-09-17 LAB — COMPREHENSIVE METABOLIC PANEL
Alkaline Phosphatase: 65 U/L (ref 39–117)
BUN: 26 mg/dL — ABNORMAL HIGH (ref 6–23)
CO2: 32 mEq/L (ref 19–32)
GFR calc non Af Amer: >60 mL/min (ref >60)
Glucose, Bld: 304 mg/dL — ABNORMAL HIGH (ref 70–99)
Potassium: 4.9 mEq/L (ref 3.5–5.1)
Total Bilirubin: 0.9 mg/dL (ref 0.3–1.2)
Total Protein: 6.6 g/dL (ref 6.0–8.3)

## 2010-09-17 LAB — GLUCOSE, CAPILLARY
Glucose-Capillary: 117 mg/dL — ABNORMAL HIGH (ref 70–99)
Glucose-Capillary: 126 mg/dL — ABNORMAL HIGH (ref 70–99)
Glucose-Capillary: 149 mg/dL — ABNORMAL HIGH (ref 70–99)
Glucose-Capillary: 167 mg/dL — ABNORMAL HIGH (ref 70–99)
Glucose-Capillary: 170 mg/dL — ABNORMAL HIGH (ref 70–99)
Glucose-Capillary: 189 mg/dL — ABNORMAL HIGH (ref 70–99)
Glucose-Capillary: 224 mg/dL — ABNORMAL HIGH (ref 70–99)
Glucose-Capillary: 224 mg/dL — ABNORMAL HIGH (ref 70–99)
Glucose-Capillary: 232 mg/dL — ABNORMAL HIGH (ref 70–99)
Glucose-Capillary: 238 mg/dL — ABNORMAL HIGH (ref 70–99)
Glucose-Capillary: 256 mg/dL — ABNORMAL HIGH (ref 70–99)
Glucose-Capillary: 261 mg/dL — ABNORMAL HIGH (ref 70–99)
Glucose-Capillary: 261 mg/dL — ABNORMAL HIGH (ref 70–99)
Glucose-Capillary: 268 mg/dL — ABNORMAL HIGH (ref 70–99)
Glucose-Capillary: 271 mg/dL — ABNORMAL HIGH (ref 70–99)
Glucose-Capillary: 272 mg/dL — ABNORMAL HIGH (ref 70–99)
Glucose-Capillary: 277 mg/dL — ABNORMAL HIGH (ref 70–99)
Glucose-Capillary: 289 mg/dL — ABNORMAL HIGH (ref 70–99)
Glucose-Capillary: 294 mg/dL — ABNORMAL HIGH (ref 70–99)
Glucose-Capillary: 341 mg/dL — ABNORMAL HIGH (ref 70–99)
Glucose-Capillary: 375 mg/dL — ABNORMAL HIGH (ref 70–99)
Glucose-Capillary: 377 mg/dL — ABNORMAL HIGH (ref 70–99)
Glucose-Capillary: 392 mg/dL — ABNORMAL HIGH (ref 70–99)
Glucose-Capillary: 395 mg/dL — ABNORMAL HIGH (ref 70–99)
Glucose-Capillary: 57 mg/dL — ABNORMAL LOW (ref 70–99)
Glucose-Capillary: 72 mg/dL (ref 70–99)

## 2010-09-17 LAB — BASIC METABOLIC PANEL
BUN: 10 mg/dL (ref 6–23)
BUN: 11 mg/dL (ref 6–23)
BUN: 17 mg/dL (ref 6–23)
BUN: 2 mg/dL — ABNORMAL LOW (ref 6–23)
BUN: 25 mg/dL — ABNORMAL HIGH (ref 6–23)
BUN: 3 mg/dL — ABNORMAL LOW (ref 6–23)
BUN: 3 mg/dL — ABNORMAL LOW (ref 6–23)
BUN: 7 mg/dL (ref 6–23)
CO2: 33 mEq/L — ABNORMAL HIGH (ref 19–32)
CO2: 33 mEq/L — ABNORMAL HIGH (ref 19–32)
CO2: 35 mEq/L — ABNORMAL HIGH (ref 19–32)
CO2: 35 mEq/L — ABNORMAL HIGH (ref 19–32)
CO2: 36 mEq/L — ABNORMAL HIGH (ref 19–32)
CO2: 36 mEq/L — ABNORMAL HIGH (ref 19–32)
CO2: 36 mEq/L — ABNORMAL HIGH (ref 19–32)
CO2: 39 mEq/L — ABNORMAL HIGH (ref 19–32)
CO2: 39 mEq/L — ABNORMAL HIGH (ref 19–32)
Calcium: 10.1 mg/dL (ref 8.4–10.5)
Calcium: 10.1 mg/dL (ref 8.4–10.5)
Calcium: 9.3 mg/dL (ref 8.4–10.5)
Calcium: 9.6 mg/dL (ref 8.4–10.5)
Calcium: 9.7 mg/dL (ref 8.4–10.5)
Calcium: 9.9 mg/dL (ref 8.4–10.5)
Calcium: 9.9 mg/dL (ref 8.4–10.5)
Chloride: 88 mEq/L — ABNORMAL LOW (ref 96–112)
Chloride: 88 mEq/L — ABNORMAL LOW (ref 96–112)
Chloride: 91 mEq/L — ABNORMAL LOW (ref 96–112)
Chloride: 92 mEq/L — ABNORMAL LOW (ref 96–112)
Chloride: 92 mEq/L — ABNORMAL LOW (ref 96–112)
Chloride: 92 mEq/L — ABNORMAL LOW (ref 96–112)
Chloride: 94 mEq/L — ABNORMAL LOW (ref 96–112)
Chloride: 95 mEq/L — ABNORMAL LOW (ref 96–112)
Chloride: 97 mEq/L (ref 96–112)
Chloride: 97 mEq/L (ref 96–112)
Creatinine, Ser: 0.52 mg/dL (ref 0.4–1.2)
Creatinine, Ser: 0.57 mg/dL (ref 0.4–1.2)
Creatinine, Ser: 0.59 mg/dL (ref 0.4–1.2)
Creatinine, Ser: 0.66 mg/dL (ref 0.4–1.2)
Creatinine, Ser: 0.71 mg/dL (ref 0.4–1.2)
Creatinine, Ser: 0.74 mg/dL (ref 0.4–1.2)
Creatinine, Ser: 0.8 mg/dL (ref 0.4–1.2)
Creatinine, Ser: 0.8 mg/dL (ref 0.4–1.2)
Creatinine, Ser: 0.85 mg/dL (ref 0.4–1.2)
GFR calc Af Amer: >60 mL/min (ref >60)
GFR calc Af Amer: >60 mL/min (ref >60)
GFR calc Af Amer: >60 mL/min (ref >60)
GFR calc Af Amer: >60 mL/min (ref >60)
GFR calc Af Amer: >60 mL/min (ref >60)
GFR calc non Af Amer: >60 mL/min (ref >60)
GFR calc non Af Amer: >60 mL/min (ref >60)
GFR calc non Af Amer: >60 mL/min (ref >60)
GFR calc non Af Amer: >60 mL/min (ref >60)
GFR calc non Af Amer: >60 mL/min (ref >60)
GFR calc non Af Amer: >60 mL/min (ref >60)
Glucose, Bld: 183 mg/dL — ABNORMAL HIGH (ref 70–99)
Glucose, Bld: 241 mg/dL — ABNORMAL HIGH (ref 70–99)
Glucose, Bld: 248 mg/dL — ABNORMAL HIGH (ref 70–99)
Glucose, Bld: 252 mg/dL — ABNORMAL HIGH (ref 70–99)
Glucose, Bld: 261 mg/dL — ABNORMAL HIGH (ref 70–99)
Glucose, Bld: 271 mg/dL — ABNORMAL HIGH (ref 70–99)
Glucose, Bld: 279 mg/dL — ABNORMAL HIGH (ref 70–99)
Glucose, Bld: 283 mg/dL — ABNORMAL HIGH (ref 70–99)
Glucose, Bld: 309 mg/dL — ABNORMAL HIGH (ref 70–99)
Glucose, Bld: 309 mg/dL — ABNORMAL HIGH (ref 70–99)
Potassium: 3.6 mEq/L (ref 3.5–5.1)
Potassium: 3.7 mEq/L (ref 3.5–5.1)
Potassium: 4.1 mEq/L (ref 3.5–5.1)
Potassium: 4.2 mEq/L (ref 3.5–5.1)
Potassium: 4.2 mEq/L (ref 3.5–5.1)
Potassium: 5.1 mEq/L (ref 3.5–5.1)
Sodium: 132 mEq/L — ABNORMAL LOW (ref 135–145)
Sodium: 135 mEq/L (ref 135–145)
Sodium: 137 mEq/L (ref 135–145)
Sodium: 138 mEq/L (ref 135–145)

## 2010-09-17 LAB — CBC
HCT: 33.9 % — ABNORMAL LOW (ref 36.0–46.0)
HCT: 34.1 % — ABNORMAL LOW (ref 36.0–46.0)
HCT: 34.5 % — ABNORMAL LOW (ref 36.0–46.0)
HCT: 36.7 % (ref 36.0–46.0)
HCT: 36.7 % (ref 36.0–46.0)
HCT: 39.5 % (ref 36.0–46.0)
Hemoglobin: 11.4 g/dL — ABNORMAL LOW (ref 12.0–15.0)
Hemoglobin: 11.5 g/dL — ABNORMAL LOW (ref 12.0–15.0)
Hemoglobin: 11.7 g/dL — ABNORMAL LOW (ref 12.0–15.0)
Hemoglobin: 11.7 g/dL — ABNORMAL LOW (ref 12.0–15.0)
Hemoglobin: 12.2 g/dL (ref 12.0–15.0)
Hemoglobin: 12.3 g/dL (ref 12.0–15.0)
Hemoglobin: 12.3 g/dL (ref 12.0–15.0)
Hemoglobin: 13 g/dL (ref 12.0–15.0)
Hemoglobin: 13.2 g/dL (ref 12.0–15.0)
MCHC: 33.2 g/dL (ref 30.0–36.0)
MCHC: 33.4 g/dL (ref 30.0–36.0)
MCHC: 33.4 g/dL (ref 30.0–36.0)
MCHC: 33.6 g/dL (ref 30.0–36.0)
MCHC: 33.8 g/dL (ref 30.0–36.0)
MCHC: 33.8 g/dL (ref 30.0–36.0)
MCHC: 33.8 g/dL (ref 30.0–36.0)
MCHC: 33.9 g/dL (ref 30.0–36.0)
MCHC: 34 g/dL (ref 30.0–36.0)
MCHC: 34 g/dL (ref 30.0–36.0)
MCV: 88 fL (ref 78.0–100.0)
MCV: 88.7 fL (ref 78.0–100.0)
MCV: 89.2 fL (ref 78.0–100.0)
MCV: 89.3 fL (ref 78.0–100.0)
MCV: 89.6 fL (ref 78.0–100.0)
MCV: 90.5 fL (ref 78.0–100.0)
MCV: 90.6 fL (ref 78.0–100.0)
MCV: 91.2 fL (ref 78.0–100.0)
Platelets: 130 10*3/uL — ABNORMAL LOW (ref 150–400)
Platelets: 138 10*3/uL — ABNORMAL LOW (ref 150–400)
Platelets: 141 10*3/uL — ABNORMAL LOW (ref 150–400)
Platelets: 149 10*3/uL — ABNORMAL LOW (ref 150–400)
Platelets: 149 10*3/uL — ABNORMAL LOW (ref 150–400)
Platelets: 158 K/uL (ref 150–400)
Platelets: 176 10*3/uL (ref 150–400)
Platelets: 177 10*3/uL (ref 150–400)
Platelets: 180 K/uL (ref 150–400)
RBC: 3.76 MIL/uL — ABNORMAL LOW (ref 3.87–5.11)
RBC: 3.77 MIL/uL — ABNORMAL LOW (ref 3.87–5.11)
RBC: 3.85 MIL/uL — ABNORMAL LOW (ref 3.87–5.11)
RBC: 4.05 MIL/uL (ref 3.87–5.11)
RBC: 4.08 MIL/uL (ref 3.87–5.11)
RBC: 4.14 MIL/uL (ref 3.87–5.11)
RBC: 4.37 MIL/uL (ref 3.87–5.11)
RBC: 4.43 MIL/uL (ref 3.87–5.11)
RDW: 14.5 % (ref 11.5–15.5)
RDW: 14.6 % (ref 11.5–15.5)
RDW: 14.9 % (ref 11.5–15.5)
RDW: 15 % (ref 11.5–15.5)
RDW: 15.1 % (ref 11.5–15.5)
RDW: 15.2 % (ref 11.5–15.5)
RDW: 15.3 % (ref 11.5–15.5)
RDW: 15.3 % (ref 11.5–15.5)
RDW: 15.4 % (ref 11.5–15.5)
WBC: 10.5 10*3/uL (ref 4.0–10.5)
WBC: 5.2 10*3/uL (ref 4.0–10.5)
WBC: 5.4 K/uL (ref 4.0–10.5)
WBC: 5.5 10*3/uL (ref 4.0–10.5)
WBC: 5.7 K/uL (ref 4.0–10.5)
WBC: 7.5 10*3/uL (ref 4.0–10.5)

## 2010-09-17 LAB — CULTURE, BLOOD (ROUTINE X 2)

## 2010-09-17 LAB — BASIC METABOLIC PANEL WITH GFR
BUN: 9 mg/dL (ref 6–23)
CO2: 38 meq/L — ABNORMAL HIGH (ref 19–32)
Calcium: 10 mg/dL (ref 8.4–10.5)
Chloride: 84 meq/L — ABNORMAL LOW (ref 96–112)
Creatinine, Ser: 0.8 mg/dL (ref 0.4–1.2)
GFR calc non Af Amer: >60 mL/min
Glucose, Bld: 355 mg/dL — ABNORMAL HIGH (ref 70–99)
Potassium: 3.9 meq/L (ref 3.5–5.1)
Sodium: 131 meq/L — ABNORMAL LOW (ref 135–145)

## 2010-09-17 LAB — HEPARIN LEVEL (UNFRACTIONATED)
Heparin Unfractionated: 0.39 IU/mL (ref 0.30–0.70)
Heparin Unfractionated: 0.49 IU/mL (ref 0.30–0.70)
Heparin Unfractionated: 0.53 IU/mL (ref 0.30–0.70)
Heparin Unfractionated: 0.55 IU/mL (ref 0.30–0.70)
Heparin Unfractionated: 0.87 IU/mL — ABNORMAL HIGH (ref 0.30–0.70)
Heparin Unfractionated: 0.92 IU/mL — ABNORMAL HIGH (ref 0.30–0.70)

## 2010-09-17 LAB — CARDIAC PANEL(CRET KIN+CKTOT+MB+TROPI)
CK, MB: 1.9 ng/mL (ref 0.3–4.0)
Relative Index: INVALID (ref 0.0–2.5)
Total CK: 23 U/L (ref 7–177)
Troponin I: 0.01 ng/mL (ref 0.00–0.06)

## 2010-09-17 LAB — URINALYSIS, ROUTINE W REFLEX MICROSCOPIC
Ketones, ur: 15 mg/dL — AB
Nitrite: NEGATIVE
Protein, ur: NEGATIVE mg/dL

## 2010-09-17 LAB — PROTIME-INR
INR: 1.2 (ref 0.00–1.49)
INR: 1.27 (ref 0.00–1.49)
INR: 1.54 — ABNORMAL HIGH (ref 0.00–1.49)
INR: 2.28 — ABNORMAL HIGH (ref 0.00–1.49)
INR: 2.43 — ABNORMAL HIGH (ref 0.00–1.49)
INR: 2.9 — ABNORMAL HIGH (ref 0.00–1.49)
Prothrombin Time: 15.1 seconds (ref 11.6–15.2)
Prothrombin Time: 15.8 seconds — ABNORMAL HIGH (ref 11.6–15.2)
Prothrombin Time: 19.2 seconds — ABNORMAL HIGH (ref 11.6–15.2)
Prothrombin Time: 19.7 seconds — ABNORMAL HIGH (ref 11.6–15.2)
Prothrombin Time: 24.9 seconds — ABNORMAL HIGH (ref 11.6–15.2)
Prothrombin Time: 26.2 seconds — ABNORMAL HIGH (ref 11.6–15.2)
Prothrombin Time: 30.1 s — ABNORMAL HIGH (ref 11.6–15.2)
Prothrombin Time: 31.2 seconds — ABNORMAL HIGH (ref 11.6–15.2)

## 2010-09-17 LAB — BLOOD GAS, ARTERIAL
Acid-Base Excess: 6.9 mmol/L — ABNORMAL HIGH (ref 0.0–2.0)
FIO2: 0.28 %
O2 Saturation: 96.7 %
Patient temperature: 98.6
pCO2 arterial: 62.7 mmHg (ref 35.0–45.0)

## 2010-09-17 LAB — MAGNESIUM
Magnesium: 1.2 mg/dL — ABNORMAL LOW (ref 1.5–2.5)
Magnesium: 1.5 mg/dL (ref 1.5–2.5)

## 2010-09-17 LAB — DIGOXIN LEVEL: Digoxin Level: 0.4 ng/mL — ABNORMAL LOW (ref 0.8–2.0)

## 2010-09-22 ENCOUNTER — Other Ambulatory Visit: Payer: Self-pay

## 2010-09-22 DIAGNOSIS — I10 Essential (primary) hypertension: Secondary | ICD-10-CM

## 2010-09-22 MED ORDER — ISOSORBIDE MONONITRATE ER 30 MG PO TB24: 30.0000 mg | ORAL_TABLET | Freq: Every day | ORAL | Status: DC

## 2010-09-29 LAB — POCT I-STAT 3, VENOUS BLOOD GAS (G3P V)
Bicarbonate: 35.2 mEq/L — ABNORMAL HIGH (ref 20.0–24.0)
pCO2, Ven: 64 mmHg — ABNORMAL HIGH (ref 45.0–50.0)
pH, Ven: 7.348 — ABNORMAL HIGH (ref 7.250–7.300)
pO2, Ven: 35 mmHg (ref 30.0–45.0)

## 2010-09-29 LAB — POCT I-STAT 3, ART BLOOD GAS (G3+)
Bicarbonate: 34.7 mEq/L — ABNORMAL HIGH (ref 20.0–24.0)
pCO2 arterial: 61.1 mmHg (ref 35.0–45.0)
pH, Arterial: 7.363 (ref 7.350–7.400)
pO2, Arterial: 75 mmHg — ABNORMAL LOW (ref 80.0–100.0)

## 2010-09-29 LAB — BASIC METABOLIC PANEL
CO2: 34 mEq/L — ABNORMAL HIGH (ref 19–32)
Chloride: 98 mEq/L (ref 96–112)
GFR calc non Af Amer: >60 mL/min (ref >60)
Glucose, Bld: 329 mg/dL — ABNORMAL HIGH (ref 70–99)
Potassium: 3.9 mEq/L (ref 3.5–5.1)
Sodium: 135 mEq/L (ref 135–145)

## 2010-09-29 LAB — GLUCOSE, CAPILLARY
Glucose-Capillary: 102 mg/dL — ABNORMAL HIGH (ref 70–99)
Glucose-Capillary: 115 mg/dL — ABNORMAL HIGH (ref 70–99)
Glucose-Capillary: 168 mg/dL — ABNORMAL HIGH (ref 70–99)

## 2010-10-03 ENCOUNTER — Other Ambulatory Visit: Payer: Self-pay | Admitting: Cardiology

## 2010-10-30 ENCOUNTER — Other Ambulatory Visit: Payer: Self-pay | Admitting: Cardiology

## 2010-10-30 ENCOUNTER — Other Ambulatory Visit: Payer: Medicare Other

## 2010-11-02 ENCOUNTER — Other Ambulatory Visit: Payer: Medicare Other

## 2010-11-09 ENCOUNTER — Encounter: Payer: Self-pay | Admitting: Family Medicine

## 2010-11-10 NOTE — Procedures (Signed)
CAROTID DUPLEX EXAM   INDICATION:  Carotid artery disease.   HISTORY:  Diabetes:  Yes  Cardiac:  No  Hypertension:  Yes  Smoking:  No  Previous Surgery:  No  CV History:  asymptomatic  Amaurosis Fugax No, Paresthesias No, Hemiparesis No                                       RIGHT             LEFT  Brachial systolic pressure:         160               162  Brachial Doppler waveforms:         Normal            Normal  Vertebral direction of flow:        Antegrade         Antegrade  DUPLEX VELOCITIES (cm/sec)  CCA peak systolic                   94                83  ECA peak systolic                   85                97  ICA peak systolic                   98                145  ICA end diastolic                   24                27  PLAQUE MORPHOLOGY:                  Mixed             Mixed  PLAQUE AMOUNT:                      Mild              Mild  PLAQUE LOCATION:                    ICA/ECA           ICA/ECA/CCA   IMPRESSION:  1% to 39% stenosis of the right internal carotid artery.  40% to 59% stenosis of the left internal carotid artery.  No significant change noted when compared to the previous exam on  11/14/2007.       ___________________________________________  Di Kindle. Edilia Bo, M.D.   CH/MEDQ  D:  11/05/2008  T:  11/05/2008  Job:  [id number]

## 2010-11-10 NOTE — Assessment & Plan Note (Signed)
[facility]                            CARDIOLOGY OFFICE NOTE   NAME:Alexandria [name], Alexandria [name]                         MRN:          [id number]  DATE:11/13/2008                            DOB:          24-Jun-1930    PRIMARY CARE PHYSICIAN:  Belva Agee, Nurse Practitioner.   REASON FOR PRESENTATION:  Evaluate the patient with dyspnea and multiple  cardiovascular risk factors.   HISTORY OF PRESENT ILLNESS:  The patient is 77 years old.  I did see her  in the past though I do not have these records.  She reports a stress  perfusion study at some point in the distant past.  I do know that she  had an echocardiogram demonstrating mild aortic sclerosis.  She has not  had any further cardiovascular testing in several years.  She does have  significant cardiovascular risk factors as described below.  Recently,  when was seeing Dr. Sandria Manly for evaluation of the tremor, she was noted to  have an irregular heart beat.  She saw Ms. Weeks in followup and EKG did  demonstrate PACs.  The patient is not really feeling these.  She has not  had any presyncope or syncope.  She does not describe chest discomfort,  neck or arm discomfort.   However, the patient does describe easy fatigability and dyspnea with  exertion.  She says this has been progressive.  She said that she was  short of breath walking from the car into this office, which is only a  short distance.  She will occasionally get short of breath at night and  have to sit up on the edge of the bed, but does not describe orthopnea.  She does not have any new lower extremity swelling or weight gain.  She  is not having any cough, fevers, or chills.  She said that dyspnea does  improve quickly when she stops, which she is doing.  However, it is  limiting her in activity, such as vacuuming.   PAST MEDICAL HISTORY:  1. Diabetes mellitus x10 years.  2. Hyperlipidemia.  3. Hypertension x40 years.   PAST SURGICAL HISTORY:   Appendectomy and nasal surgery.   ALLERGIES:  None.   MEDICATIONS:  1. Actos 30 mg daily.  2. Metformin 1000 mg b.i.d.  3. Glimepiride 4 mg daily.  4. Levemir 25 units daily.  5. Propranolol 120 mg q.a.m. and 120 mg q.p.m.  6. Amlodipine 10 mg daily.  7. Aspirin 81 mg daily.  8. WelChol 625 mg daily.  9. Xalatan.  10.Vitamin D.   SOCIAL HISTORY:  The patient is a widow.  She has no children.  She  never smoked cigarettes, does not drink alcohol.   FAMILY HISTORY:  Noncontributory for early coronary artery disease.  She  does have later onset heart disease in her brother who is alive with  stents.   REVIEW OF SYSTEMS:  As stated in the HPI.  Positive for tremor.  Negative for all other systems.   PHYSICAL EXAMINATION:  GENERAL:  The patient is pleasant and in no  distress.  VITAL SIGNS:  Blood pressure 146/70, heart rate 60 and regular, weight  207 pounds, and body mass index 36.  HEENT:  Eyelids unremarkable.  Pupils equal, round, and reactive to  light.  Fundi not visualized.  Oral mucosa unremarkable.  NECK:  No jugular venous distention at 45 degrees.  Carotid upstroke  brisk and symmetric.  No bruits.  No thyromegaly.  LYMPHATICS:  No cervical, axillary, or inguinal adenopathy.  LUNGS:  Clear to auscultation bilaterally.  BACK:  No costovertebral angle tenderness.  CHEST:  Unremarkable.  HEART:  PMI not displaced or sustained.  S1 and S2 within normal limits.  No S3, no S4.  No clicks, no rubs, no murmurs.  ABDOMEN:  Obese, positive bowel sounds.  Normal in frequency and pitch.  No bruits, no rebound, no guarding.  No midline pulsatile mass.  No  hepatomegaly.  No splenomegaly.  SKIN:  No rashes.  No nodules.  EXTREMITIES:  Pulses 2+ throughout.  No edema.  No cyanosis or clubbing.  NEUROLOGIC:  Oriented to person, place, and time.  Cranial nerves II  through XII grossly intact.  Motor grossly intact.  Fine resting tremor.   EKG, sinus rhythm, rate 75, axis within  normal limits, intervals within  normal limits, premature atrial contractions, poor anterior R-wave  progression, no acute ST-T wave changes.   ASSESSMENT AND PLAN:  1. Dyspnea.  The patient's predominant complaint is dyspnea.  This may      well be an anginal equivalent in this patient with longstanding      cardiovascular risk factors.  The pretested probability of      obstructive coronary disease is at least moderately high.  She      would not be able to walk on a treadmill.  Therefore, she will need      an adenosine perfusion study.  We should look for balance ischemia      with this study.  If she has no evidence of ischemia then I would      consider further evaluation with the BNP study and possibly an      echocardiogram.  2. Hypertension.  Blood pressure is slightly elevated, but has not      been on previous visits.  We will keep an eye on this and manage      her meds as needed.  Certainly, weight loss would help get her to      target less than 140/90.  3. Hyperlipidemia per Dr. Christell Constant with a goal LDL less than 100 and HDL      greater than 50.  4. Premature atrial contractions.  The patient is not really feeling      these.  I do not think these are related to her current symptoms.      No further therapy would be warranted.  5. Obesity.  She understands the need to lose weight with diet and      exercise.  6. Followup will be after the results of the stress test.     [name], MD, [facility]  Electronically Signed    JH/MedQ  DD: 11/13/2008  DT: 11/14/2008  Job #: [id number]   cc:   Belva Agee, NP

## 2010-11-10 NOTE — Assessment & Plan Note (Signed)
[name]                            CARDIOLOGY OFFICE NOTE   NAME:Alexandria [name]                         MRN:          [id number]  DATE:02/26/2009                            DOB:          1929/09/15    PRIMARY CARE PHYSICIAN:  Alexandria Agee, NP   REASON FOR PRESENTATION:  Evaluate the patient with dyspnea.   HISTORY OF PRESENT ILLNESS:  The patient presents for followup of the  above.  Because of this complain of multiple cardiovascular risk  factors, I did send her for stress perfusion study in June.  This  demonstrated no evidence of ischemia or infarct.  There were some  questionable breast attenuation.  Since that time, she has had no new  complaints.  She still gets very fatigued.  However, she says she does  not sleep well at night.  She, however, does not want to take anything  for management of this.  She still gets dyspneic walking to moderate  distance on a level ground.  She admits to having gained weight and to  overeating.  As I review, her blood pressure has been marginally  controlled.  Her hemoglobin A1c was 7.7.  Her LDL 123.  Yet she would  like to come off of her medications as she thinks these contribute to  many of her symptoms.  Overall, she has no acute new complaints.  She is  not describing chest pain.  She is not describing PND or orthopnea.  She  has not been having any palpitations, presyncope or syncope.   PAST MEDICAL HISTORY:  Diabetes mellitus x10 years, hyperlipidemia,  hypertension x40 years, morbid obesity.   PAST SURGICAL HISTORY:  Appendectomy, nasal surgery.   ALLERGIES:  None.   MEDICATIONS:  1. Actos 45 mg daily.  2. Inderal 120 mg b.i.d.  3. Metformin 2000 mg daily.  4. Glimepiride 8 mg daily.  5. Aspirin.  6. Insulin.  7. Amlodipine 10 mg daily.  8. Welchol.  9. Xylitol.  10.Vitamin D.   REVIEW OF SYSTEMS:  As stated in the HPI and otherwise negative for  other systems.   PHYSICAL EXAMINATION:   GENERAL:  The patient is in no distress.  VITAL SIGNS:  Blood pressure 154/78, heart rate 64 and regular, weight  211 pounds, body mass index 36.  NECK:  No jugular venous distention at 45 degrees, carotid upstroke  brisk and symmetric, no bruits, no thyromegaly.  LYMPHATICS:  No adenopathy.  LUNGS:  Decreased breath sounds without wheezing or crackles.  HEART:  PMI not displaced or sustained, S1 and S2 within normal.  No S3,  no S4, no clicks, no rubs, no murmurs.  ABDOMEN:  Obese, positive bowel sounds.  Normal in frequency and pitch,  no bruits, no rebound, no guarding, no midline pulsatile mass, no  organomegaly.  SKIN:  No rashes, no nodules.  EXTREMITIES:  Pulses 2+, no edema, no cyanosis, no clubbing.  NEURO:  Resting tremor, otherwise grossly intact.   EKG; sinus rhythm, rate 64, poor anterior R-wave progression, left axis  deviation, no acute ST-T wave changes.  ASSESSMENT AND PLAN:  1. Dyspnea.  At this point, I do not see an obvious cardiac etiology.      Certainly, her inactivity and weight could contribute.  She could      be referred for pulmonary evaluation if this progresses.  No      further cardiovascular testing is suggested.  2. Obesity.  Unfortunately, the patient turns to food for comfort.      She does not exercise.  She then blames many of her problems on      medications which are there to treat the complications of her      morbid obesity.  I will try to have this discussion with her.  I      asked her to refer back to her primary care doctor to discuss the      complaints that she might have against her medicines and to see if      there is any alternatives.  3. Followup will be in this clinic as needed.      [name], MD, [facility]  Electronically Signed    JH/MedQ  DD: 02/26/2009  DT: 02/27/2009  Job #: [id number]   cc:   Alexandria [name]

## 2010-11-10 NOTE — Procedures (Signed)
CAROTID DUPLEX EXAM   INDICATION:  Carotid artery disease.   HISTORY:  Diabetes:  Yes.  Cardiac:  ? Marland Kitchen  Hypertension:  Yes.  Smoking:  No.  Previous Surgery:  No.  CV History:  Asymptomatic.  Amaurosis Fugax No, Paresthesias No, Hemiparesis No                                       RIGHT             LEFT  Brachial systolic pressure:         148               150  Brachial Doppler waveforms:         WNL               WNL  Vertebral direction of flow:        Antegrade         Antegrade  DUPLEX VELOCITIES (cm/sec)  CCA peak systolic                   92                102  ECA peak systolic                   159               156  ICA peak systolic                   100               136  ICA end diastolic                   23                27  PLAQUE MORPHOLOGY:                  Mixed             Mixed  PLAQUE AMOUNT:                      Mild              Mild  PLAQUE LOCATION:                    ICA / ECA         ICA / ECA / CCA   IMPRESSION:  1. Right internal carotid artery shows evidence of 1%-39% stenosis.  2. Left internal carotid artery shows evidence of 40%-59% stenosis.  3. No significant changes from previous study.   ___________________________________________  Di Kindle. Edilia Bo, M.D.   AS/MEDQ  D:  10/29/2009  T:  10/29/2009  Job:  [id number]

## 2010-11-10 NOTE — Procedures (Signed)
CAROTID DUPLEX EXAM   INDICATION:  Followup, carotid artery disease.   HISTORY:  Diabetes:  Yes.  Cardiac:  No.  Hypertension:  Yes.  Smoking:  No.  Previous Surgery:  No.  CV History:  No.  Amaurosis Fugax No, Paresthesias No, Hemiparesis No                                       RIGHT             LEFT  Brachial systolic pressure:         170               168  Brachial Doppler waveforms:         Triphasic         Triphasic  Vertebral direction of flow:        Antegrade         Antegrade  DUPLEX VELOCITIES (cm/sec)  CCA peak systolic                   58                64  ECA peak systolic                   80                122  ICA peak systolic                   71                136  ICA end diastolic                   16                35  PLAQUE MORPHOLOGY:                  Calcified         Mixed  PLAQUE AMOUNT:                      Mild              Mild  PLAQUE LOCATION:                    ICA/ECA           ICA/ECA   IMPRESSION:  1. Right internal carotid artery shows evidence of 20-39% stenosis.  2. Left internal carotid artery shows evidence of 40-59% stenosis (low      end of range), a slight increase from previous study.   ___________________________________________  Di Kindle. Edilia Bo, M.D.   AS/MEDQ  D:  11/14/2007  T:  11/14/2007  Job:  [id number]

## 2010-11-10 NOTE — Assessment & Plan Note (Signed)
[facility]                         GASTROENTEROLOGY OFFICE NOTE   NAME:TILLEYSerita, [name]                         MRN:          [id number]  DATE:05/16/2007                            DOB:          1930/03/31    The patient was referred by Dr. Vernon Prey and Belva Agee, his  physician's assistant.   REASON FOR REFERRAL:  Belva Agee asked me to evaluate Alexandria [name] in  consultation regarding colorectal cancer screening with colonoscopy.   HISTORY OF PRESENT ILLNESS:  Alexandria [name] is a very pleasant 77 year old  woman who is an insulin-dependent diabetic who has had very mild  intermittent constipation. She has gained 10 pounds in the last 6  months, has had no rectal bleeding, no diarrhea. She does not take  anything for this mild constipation. She says she never goes more than  two days without having a bowel movement. She has never had a  colonoscopy. She has no specific abdominal discomfort.   REVIEW OF SYSTEMS:  Notable for 10-pound weight gain in the past 6  months. The rest of her review of systems is essentially normal and  available on our nursing intake sheet.   PAST MEDICAL HISTORY:  1. Hypertension.  2. Diabetes.  3. Status post appendectomy.   CURRENT MEDICATIONS:  1. Actos.  2. Metformin.  3. Glimepiride.  4. Propranolol.  5. Aspirin.  6. Insulin every night.   ALLERGIES:  No known drug allergies.   SOCIAL HISTORY:  Married. Lives with her husband who has advanced  Parkinson's disease. Nonsmoker, nondrinker.   FAMILY HISTORY:  No colon cancer or colon polyps in the family.   PHYSICAL EXAMINATION:  Weight 201 pounds. Blood pressure 196/88, pulse  78.  CONSTITUTIONAL:  Generally well appearing.  NEUROLOGICAL:  Alert and oriented x3.  EYES:  Extraocular movements intact. Mouth and oropharynx moist. No  lesions.  NECK:  Supple with no lymphadenopathy.  CARDIOVASCULAR:  Heart - regular rate and rhythm.  LUNGS:  Clear to  auscultation bilaterally.  ABDOMEN:  Soft, nontender, nondistended. Normal bowel sounds.  EXTREMITIES:  No lower extremity edema.  SKIN:  No rashes or lesions on the visible extremities.   ASSESSMENT AND PLAN:  A 77 year old woman at routine risk of colorectal  cancer on insulin for diabetes.   We will arrange for Alexandria [name] to have a colonoscopy performed at her  soonest convenience. She is on insulin for diabetes, and we will use our  normal insulin protocol which is that she will take half of her nightly  insulin the night before the procedure and none of her insulin the  morning of her procedure. We will also check her blood sugar levels  several times throughout the morning that she is here. I see no reason  for any further blood test or imaging studies prior to colonoscopy.     Rachael Fee, MD  Electronically Signed    DPJ/MedQ  DD: 05/16/2007  DT: 05/16/2007  Job #: [id number]   cc:   Ernestina Penna, M.D.  Belva Agee

## 2010-12-01 ENCOUNTER — Ambulatory Visit: Payer: Medicare Other | Attending: Family Medicine | Admitting: Physical Therapy

## 2010-12-01 DIAGNOSIS — IMO0001 Reserved for inherently not codable concepts without codable children: Secondary | ICD-10-CM | POA: Insufficient documentation

## 2010-12-01 DIAGNOSIS — R262 Difficulty in walking, not elsewhere classified: Secondary | ICD-10-CM | POA: Insufficient documentation

## 2010-12-01 DIAGNOSIS — R5381 Other malaise: Secondary | ICD-10-CM | POA: Insufficient documentation

## 2010-12-03 ENCOUNTER — Ambulatory Visit: Payer: Medicare Other | Admitting: Physical Therapy

## 2010-12-08 ENCOUNTER — Encounter: Payer: Medicare Other | Admitting: Physical Therapy

## 2010-12-10 ENCOUNTER — Ambulatory Visit: Payer: Medicare Other | Admitting: Physical Therapy

## 2010-12-10 ENCOUNTER — Other Ambulatory Visit (INDEPENDENT_AMBULATORY_CARE_PROVIDER_SITE_OTHER): Payer: Medicare Other

## 2010-12-10 DIAGNOSIS — I6529 Occlusion and stenosis of unspecified carotid artery: Secondary | ICD-10-CM

## 2010-12-11 ENCOUNTER — Other Ambulatory Visit: Payer: Self-pay | Admitting: Cardiology

## 2010-12-15 ENCOUNTER — Ambulatory Visit: Payer: Medicare Other | Admitting: Physical Therapy

## 2010-12-17 ENCOUNTER — Ambulatory Visit: Payer: Medicare Other | Admitting: Physical Therapy

## 2010-12-17 NOTE — Procedures (Unsigned)
CAROTID DUPLEX EXAM  INDICATION:  Follow up carotid stenosis.  HISTORY: Diabetes:  Yes Cardiac:  No Hypertension:  yes Smoking:  no Previous Surgery:  no CV History:  Asymptomatic. Amaurosis Fugax No, Paresthesias No, Hemiparesis No                                      RIGHT             LEFT Brachial systolic pressure:         182               174 Brachial Doppler waveforms:         WNL               WNL Vertebral direction of flow:        antegrade         antegrade DUPLEX VELOCITIES (cm/sec) CCA peak systolic                   83                120 ECA peak systolic                   223               109 ICA peak systolic                   90                163 ICA end diastolic                   18                28 PLAQUE MORPHOLOGY:                  calcified         calcified PLAQUE AMOUNT:                      Mild to moderate  Mild to moderate PLAQUE LOCATION:                    CCA/ICA/ECA       CCA/ICA/ECA  IMPRESSION: 1. Bilateral common carotid artery disease present. 2. Bilateral internal carotid artery disease in the 1% to 39% range,     however may be underestimated due to dense calcified plaque making     Doppler interrogation difficult. 3. The velocities of the bilateral internal carotid arteries have     essentially not changed since previous studies.  ___________________________________________ Di Kindle. Edilia Bo, M.D.  SH/MEDQ  D:  12/10/2010  T:  12/10/2010  Job:  [id number]

## 2010-12-22 ENCOUNTER — Encounter: Payer: Medicare Other | Admitting: *Deleted

## 2010-12-24 ENCOUNTER — Encounter: Payer: Medicare Other | Admitting: *Deleted

## 2010-12-29 ENCOUNTER — Ambulatory Visit: Payer: Medicare Other | Attending: Family Medicine | Admitting: Physical Therapy

## 2010-12-29 DIAGNOSIS — R262 Difficulty in walking, not elsewhere classified: Secondary | ICD-10-CM | POA: Insufficient documentation

## 2010-12-29 DIAGNOSIS — R5381 Other malaise: Secondary | ICD-10-CM | POA: Insufficient documentation

## 2010-12-29 DIAGNOSIS — IMO0001 Reserved for inherently not codable concepts without codable children: Secondary | ICD-10-CM | POA: Insufficient documentation

## 2010-12-31 ENCOUNTER — Encounter: Payer: Medicare Other | Admitting: Physical Therapy

## 2011-01-21 ENCOUNTER — Other Ambulatory Visit: Payer: Self-pay | Admitting: Cardiology

## 2011-01-22 ENCOUNTER — Other Ambulatory Visit: Payer: Self-pay | Admitting: Nurse Practitioner

## 2011-01-25 ENCOUNTER — Encounter (INDEPENDENT_AMBULATORY_CARE_PROVIDER_SITE_OTHER): Payer: Medicare Other | Admitting: Ophthalmology

## 2011-01-25 DIAGNOSIS — H43819 Vitreous degeneration, unspecified eye: Secondary | ICD-10-CM

## 2011-01-25 DIAGNOSIS — E11311 Type 2 diabetes mellitus with unspecified diabetic retinopathy with macular edema: Secondary | ICD-10-CM

## 2011-01-25 DIAGNOSIS — E11359 Type 2 diabetes mellitus with proliferative diabetic retinopathy without macular edema: Secondary | ICD-10-CM

## 2011-02-01 ENCOUNTER — Encounter: Payer: Self-pay | Admitting: Pulmonary Disease

## 2011-02-01 ENCOUNTER — Ambulatory Visit (INDEPENDENT_AMBULATORY_CARE_PROVIDER_SITE_OTHER): Payer: Medicare Other | Admitting: Pulmonary Disease

## 2011-02-01 VITALS — BP 106/62 | HR 110 | Temp 97.9°F | Ht 62.75 in | Wt 164.0 lb

## 2011-02-01 DIAGNOSIS — J8410 Pulmonary fibrosis, unspecified: Secondary | ICD-10-CM

## 2011-02-01 DIAGNOSIS — R0602 Shortness of breath: Secondary | ICD-10-CM

## 2011-02-01 NOTE — Progress Notes (Signed)
  Subjective:    Patient ID: Alexandria [name], female    DOB: [birth year], 77 y.o.   MRN: [id number]  HPI 80/F never smoker for FU of ILD, coronary artery disease & diastolic congestive heart failure.  She presented with dyspneia on walking short distances, insidious onset x 9 months, gradually progressive, on O2 since 12/10,  orthopnea &pedal edema   Echo - RVSP 35, EF 55-60%, grade 2 diastolic dysfunction  Cath >> RA 14, RV 62/19, PA 60/19, PCWP 28, CI 2.3  PFTs >> moderate restriction, intraparenchymal, TLC 61%, DLCO 48%, FVC 52%  ABG >> 7.36/61/75 when hospitalised 12/10  CXR s/o cardiomegaly with pulmonary edema but cannot r/o occult fibrosis.  CT chest jan'11 >> Mild air trapping s/o small airways disease, very mild subpleural fibrotic changes in the lower lobes, 4 mm right upper lobe nodule, Pulmonary arterial hypertension.  ESR 31, TSH nml, RA neg, ANA neg  DId not tolerate empiric steroid trial due to hyperglycemia.  Hosp admission 2/15-28/11, diuresed well, failed cardioversion, dc'd on 80 mg lasix two times a day     02/01/2011 remarkably improved  Her edema is much better . Dyspnea is much less. No dyspnea at rest. Now can walk to mail box and back without stopping. . No desatn  on walking. not using O2, takes lasix as needed only - no pedal edema  Last cXR 2/12 shows stable interstitial changes  Denies chest pain, orthopnea, hemoptysis, fever, n/v/d, edema, headache,recent travel or antibiotics.     Review of Systems Patient denies significant dyspnea,cough, hemoptysis,  chest pain, palpitations, pedal edema, orthopnea, paroxysmal nocturnal dyspnea, lightheadedness, nausea, vomiting, abdominal or  leg pains      Objective:   Physical Exam Gen. Pleasant, well-nourished, in no distress ENT - no lesions, no post nasal drip Neck: No JVD, no thyromegaly, no carotid bruits Lungs: no use of accessory muscles, no dullness to percussion, clear without rales or rhonchi    Cardiovascular: Rhythm regular, heart sounds  normal, no murmurs or gallops, no peripheral edema Musculoskeletal: No deformities, no cyanosis or clubbing         Assessment & Plan:

## 2011-02-01 NOTE — Patient Instructions (Signed)
I am pleased you are doing so well Stop oxygen if Ambulatory satn ok

## 2011-02-03 NOTE — Assessment & Plan Note (Signed)
In hindsight, her presentation & course are more consistent with diastolic CHF rather than ILD. OK to dc O2 Monitor wt & lasix if gains wt.

## 2011-02-11 ENCOUNTER — Telehealth: Payer: Self-pay | Admitting: Cardiology

## 2011-02-11 NOTE — Telephone Encounter (Signed)
C/o heart racing . Not feeling well.

## 2011-02-11 NOTE — Telephone Encounter (Signed)
Patient states she checks her saturation in a pulse oximetry because of pulmonary problems. She states sometime she sees her heart rate goes up and down 114, 116, and down to 50 beats/ minute, it wont stay the same. Patient has a history of A-fib so the heart rate is not accurate. Patient is feeling fine, she verbalized understanding. Patient's call was transferred to [facility] for 12 months F/U visit with Dr. Antoine Poche MD in [place].

## 2011-03-02 ENCOUNTER — Encounter (INDEPENDENT_AMBULATORY_CARE_PROVIDER_SITE_OTHER): Payer: Medicare Other | Admitting: Ophthalmology

## 2011-03-02 DIAGNOSIS — E11311 Type 2 diabetes mellitus with unspecified diabetic retinopathy with macular edema: Secondary | ICD-10-CM

## 2011-03-02 DIAGNOSIS — E11359 Type 2 diabetes mellitus with proliferative diabetic retinopathy without macular edema: Secondary | ICD-10-CM

## 2011-03-02 DIAGNOSIS — H43819 Vitreous degeneration, unspecified eye: Secondary | ICD-10-CM

## 2011-03-15 ENCOUNTER — Encounter: Payer: Self-pay | Admitting: Cardiology

## 2011-03-17 ENCOUNTER — Encounter: Payer: Self-pay | Admitting: Cardiology

## 2011-03-17 ENCOUNTER — Ambulatory Visit (INDEPENDENT_AMBULATORY_CARE_PROVIDER_SITE_OTHER): Payer: Medicare Other | Admitting: Cardiology

## 2011-03-17 DIAGNOSIS — I10 Essential (primary) hypertension: Secondary | ICD-10-CM

## 2011-03-17 DIAGNOSIS — J8410 Pulmonary fibrosis, unspecified: Secondary | ICD-10-CM

## 2011-03-17 DIAGNOSIS — I509 Heart failure, unspecified: Secondary | ICD-10-CM

## 2011-03-17 DIAGNOSIS — I4891 Unspecified atrial fibrillation: Secondary | ICD-10-CM

## 2011-03-17 NOTE — Patient Instructions (Addendum)
Your physician has recommended that you wear a holter monitor. Holter monitors are medical devices that record the heart's electrical activity. Doctors most often use these monitors to diagnose arrhythmias. Arrhythmias are problems with the speed or rhythm of the heartbeat. The monitor is a small, portable device. You can wear one while you do your normal daily activities. This is usually used to diagnose what is causing palpitations/syncope (passing out).  The current medical regimen is effective;  continue present plan and medications.  Follow up with Dr Antoine Poche in 3 months.

## 2011-03-17 NOTE — Assessment & Plan Note (Signed)
She seems to be euvolemic.  At this point, no change in therapy is indicated.  We have reviewed salt and fluid restrictions.  No further cardiovascular testing is indicated.   

## 2011-03-17 NOTE — Progress Notes (Signed)
HPI The patient called to get an appointment because her heart rate has been elevated.  She has noticed this at times when she takes her blood pressure. Her heart rate in the 100. She does not describe it is irregular however. She has not had presyncope or syncope. She's had a couple of spells where her heart rate has been in the bases. She's not describing any chest discomfort, neck or arm discomfort. She is not describing any new shortness of breath and is completely off of oxygen. She's had no new PND or orthopnea. She has had no new weight gain or edema. She's not sure if her blood pressure has been running high and she's not taking it that frequently.  Allergies  Allergen Reactions  . Ace Inhibitors Cough    Enalapril  benicor    . Crestor (Rosuvastatin Calcium)     Fatigue and leg pain   . Lipitor (Atorvastatin Calcium)     Myalgias   . Niaspan (Niacin)     Intol   . Prednisone     REACTION: "retained fluid"    Current Outpatient Prescriptions  Medication Sig Dispense Refill  . amLODipine (NORVASC) 10 MG tablet Take 10 mg by mouth daily.        Marland Kitchen aspirin (LONGS ADULT LOW STRENGTH ASA) 81 MG EC tablet Take 81 mg by mouth daily.        Marland Kitchen BESIVANCE 0.6 % SUSP prn      . Cholecalciferol (VITAMIN D) 1000 UNITS capsule Take 1,000 Units by mouth daily. OTC        . digoxin (LANOXIN) 0.125 MG tablet TAKE ONE TABLET BY MOUTH ONE TIME DAILY  30 tablet  12  . furosemide (LASIX) 40 MG tablet TAKE 2 TABLETS BY MOUTH TWICE DAILY  120 tablet  5  . glimepiride (AMARYL) 4 MG tablet Take 1 tablet by mouth Twice daily.      . Ibuprofen (MOTRIN IB PO) Take by mouth as needed. OTC         . insulin detemir (LEVEMIR) 100 UNIT/ML injection Inject 28 Units into the skin at bedtime. An 16 units at night      . isosorbide mononitrate (IMDUR) 30 MG 24 hr tablet Take 1 tablet (30 mg total) by mouth daily.  30 tablet  11  . KLOR-CON M10 10 MEQ tablet TAKE ONE TABLET BY MOUTH ONE TIME DAILY  30 each  5  .  latanoprost (XALATAN) 0.005 % ophthalmic solution 1 drop at bedtime.        Marland Kitchen MATZIM LA 420 MG 24 hr tablet TAKE ONE TABLET BY MOUTH ONE TIME DAILY  30 tablet  3  . metFORMIN (GLUCOPHAGE) 1000 MG tablet Take 1,000 mg by mouth 2 (two) times daily with a meal.        . metoprolol tartrate (LOPRESSOR) 25 MG tablet TAKE ONE TABLET BY MOUTH TWICE DAILY  60 tablet  5  . nitroGLYCERIN (NITROSTAT) 0.4 MG SL tablet Place 0.4 mg under the tongue every 5 (five) minutes as needed. As needed       . warfarin (COUMADIN) 5 MG tablet 2 on Monday and 1 1/2 all other days        Past Medical History  Diagnosis Date  . Hypertension   . Glaucoma   . Tremor, essential   . Diabetes mellitus with neuropathy   . B12 deficiency   . Hyperlipidemia   . DM (diabetes mellitus)   . Atrial fibrillation   .  Diastolic heart failure   . Tremor     Past Surgical History  Procedure Date  . Appendectomy   . Nose surgery     ROS:  As stated in the HPI and negative for all other systems.   PHYSICAL EXAM BP 164/68  Pulse 79  Resp 18  Ht 5\' 3"  (1.6 m)  Wt 168 lb (76.204 kg)  BMI 29.76 kg/m2 GENERAL:  Well appearing HEENT:  Pupils equal round and reactive, fundi not visualized, oral mucosa unremarkable NECK:  No jugular venous distention, waveform within normal limits, carotid upstroke brisk and symmetric, no bruits, no thyromegaly LYMPHATICS:  No cervical, inguinal adenopathy LUNGS:  Bilateral fine crackles. BACK:  No CVA tenderness CHEST:  Unremarkable HEART:  PMI not displaced or sustained,S1 and S2 within normal limits, no S3, no S4, no clicks, no rubs, no murmurs ABD:  Flat, positive bowel sounds normal in frequency in pitch, no bruits, no rebound, no guarding, no midline pulsatile mass, no hepatomegaly, no splenomegaly EXT:  2 plus pulses throughout, no edema, no cyanosis no clubbing SKIN:  No rashes no nodules NEURO:  Cranial nerves II through XII grossly intact, motor grossly intact throughout,  resting tremor PSYCH:  Cognitively intact, oriented to person place and time  EKG:  Sinus rhythm rate 79, axis within normal limits, probable old anteroseptal infarct.  No change from previous except that rhythm is sinus.   ASSESSMENT AND PLAN

## 2011-03-17 NOTE — Assessment & Plan Note (Signed)
The blood pressure continues to be high. I have instructed the patient to record a blood pressure diary and recording this. This will be presented for my review and pending these results I will make further suggestions about changes in therapy for optimal blood pressure control.  

## 2011-03-17 NOTE — Assessment & Plan Note (Signed)
I doubt that she is describing paroxysms of atrial fib.  However, I will order a 24 your Holter.  She will otherwise continue meds as listed.  She tolerates the coumadin.

## 2011-03-17 NOTE — Assessment & Plan Note (Signed)
This is followed by pulmonary and she is no longer using O2.  No change in therapy is indicated.

## 2011-04-06 ENCOUNTER — Encounter (INDEPENDENT_AMBULATORY_CARE_PROVIDER_SITE_OTHER): Payer: Medicare Other | Admitting: Ophthalmology

## 2011-04-06 DIAGNOSIS — E11359 Type 2 diabetes mellitus with proliferative diabetic retinopathy without macular edema: Secondary | ICD-10-CM

## 2011-04-06 DIAGNOSIS — E11311 Type 2 diabetes mellitus with unspecified diabetic retinopathy with macular edema: Secondary | ICD-10-CM

## 2011-04-06 DIAGNOSIS — H43819 Vitreous degeneration, unspecified eye: Secondary | ICD-10-CM

## 2011-04-12 ENCOUNTER — Encounter (INDEPENDENT_AMBULATORY_CARE_PROVIDER_SITE_OTHER): Payer: Medicare Other | Admitting: Ophthalmology

## 2011-04-12 DIAGNOSIS — E11359 Type 2 diabetes mellitus with proliferative diabetic retinopathy without macular edema: Secondary | ICD-10-CM

## 2011-04-12 DIAGNOSIS — H43819 Vitreous degeneration, unspecified eye: Secondary | ICD-10-CM

## 2011-04-12 DIAGNOSIS — E11311 Type 2 diabetes mellitus with unspecified diabetic retinopathy with macular edema: Secondary | ICD-10-CM

## 2011-04-22 ENCOUNTER — Other Ambulatory Visit: Payer: Self-pay | Admitting: Cardiology

## 2011-05-19 ENCOUNTER — Encounter (INDEPENDENT_AMBULATORY_CARE_PROVIDER_SITE_OTHER): Payer: Medicare Other | Admitting: Ophthalmology

## 2011-05-19 DIAGNOSIS — H43819 Vitreous degeneration, unspecified eye: Secondary | ICD-10-CM

## 2011-05-19 DIAGNOSIS — E11359 Type 2 diabetes mellitus with proliferative diabetic retinopathy without macular edema: Secondary | ICD-10-CM

## 2011-05-19 DIAGNOSIS — E11311 Type 2 diabetes mellitus with unspecified diabetic retinopathy with macular edema: Secondary | ICD-10-CM

## 2011-05-19 DIAGNOSIS — E1165 Type 2 diabetes mellitus with hyperglycemia: Secondary | ICD-10-CM

## 2011-05-20 ENCOUNTER — Inpatient Hospital Stay (HOSPITAL_COMMUNITY)
Admission: EM | Admit: 2011-05-20 | Discharge: 2011-05-23 | DRG: 293 | Disposition: A | Discharge status: Home / Self Care | Payer: Medicare Other | Attending: Cardiology | Admitting: Cardiology

## 2011-05-20 ENCOUNTER — Other Ambulatory Visit: Payer: Self-pay

## 2011-05-20 ENCOUNTER — Encounter (HOSPITAL_COMMUNITY): Payer: Self-pay | Admitting: Emergency Medicine

## 2011-05-20 ENCOUNTER — Emergency Department (HOSPITAL_COMMUNITY): Payer: Medicare Other

## 2011-05-20 DIAGNOSIS — Z794 Long term (current) use of insulin: Secondary | ICD-10-CM

## 2011-05-20 DIAGNOSIS — R0989 Other specified symptoms and signs involving the circulatory and respiratory systems: Secondary | ICD-10-CM

## 2011-05-20 DIAGNOSIS — E669 Obesity, unspecified: Secondary | ICD-10-CM | POA: Diagnosis present

## 2011-05-20 DIAGNOSIS — R079 Chest pain, unspecified: Secondary | ICD-10-CM

## 2011-05-20 DIAGNOSIS — H409 Unspecified glaucoma: Secondary | ICD-10-CM | POA: Diagnosis present

## 2011-05-20 DIAGNOSIS — I4891 Unspecified atrial fibrillation: Secondary | ICD-10-CM | POA: Diagnosis present

## 2011-05-20 DIAGNOSIS — I658 Occlusion and stenosis of other precerebral arteries: Secondary | ICD-10-CM | POA: Diagnosis present

## 2011-05-20 DIAGNOSIS — G252 Other specified forms of tremor: Secondary | ICD-10-CM | POA: Diagnosis present

## 2011-05-20 DIAGNOSIS — I6529 Occlusion and stenosis of unspecified carotid artery: Secondary | ICD-10-CM | POA: Diagnosis present

## 2011-05-20 DIAGNOSIS — G4733 Obstructive sleep apnea (adult) (pediatric): Secondary | ICD-10-CM | POA: Diagnosis present

## 2011-05-20 DIAGNOSIS — E538 Deficiency of other specified B group vitamins: Secondary | ICD-10-CM | POA: Diagnosis present

## 2011-05-20 DIAGNOSIS — E1142 Type 2 diabetes mellitus with diabetic polyneuropathy: Secondary | ICD-10-CM | POA: Diagnosis present

## 2011-05-20 DIAGNOSIS — E119 Type 2 diabetes mellitus without complications: Secondary | ICD-10-CM

## 2011-05-20 DIAGNOSIS — I2510 Atherosclerotic heart disease of native coronary artery without angina pectoris: Secondary | ICD-10-CM | POA: Diagnosis present

## 2011-05-20 DIAGNOSIS — I10 Essential (primary) hypertension: Secondary | ICD-10-CM | POA: Diagnosis present

## 2011-05-20 DIAGNOSIS — E785 Hyperlipidemia, unspecified: Secondary | ICD-10-CM | POA: Diagnosis present

## 2011-05-20 DIAGNOSIS — Z79899 Other long term (current) drug therapy: Secondary | ICD-10-CM

## 2011-05-20 DIAGNOSIS — R0609 Other forms of dyspnea: Secondary | ICD-10-CM

## 2011-05-20 DIAGNOSIS — E1149 Type 2 diabetes mellitus with other diabetic neurological complication: Secondary | ICD-10-CM | POA: Diagnosis present

## 2011-05-20 DIAGNOSIS — G250 Essential tremor: Secondary | ICD-10-CM | POA: Diagnosis present

## 2011-05-20 DIAGNOSIS — I5033 Acute on chronic diastolic (congestive) heart failure: Principal | ICD-10-CM

## 2011-05-20 DIAGNOSIS — I509 Heart failure, unspecified: Secondary | ICD-10-CM

## 2011-05-20 DIAGNOSIS — Z85828 Personal history of other malignant neoplasm of skin: Secondary | ICD-10-CM

## 2011-05-20 DIAGNOSIS — I169 Hypertensive crisis, unspecified: Secondary | ICD-10-CM

## 2011-05-20 DIAGNOSIS — Z7982 Long term (current) use of aspirin: Secondary | ICD-10-CM

## 2011-05-20 DIAGNOSIS — Z7901 Long term (current) use of anticoagulants: Secondary | ICD-10-CM

## 2011-05-20 DIAGNOSIS — J8410 Pulmonary fibrosis, unspecified: Secondary | ICD-10-CM | POA: Diagnosis present

## 2011-05-20 HISTORY — DX: Atherosclerotic heart disease of native coronary artery without angina pectoris: I25.10

## 2011-05-20 HISTORY — DX: Unspecified malignant neoplasm of skin, unspecified: C44.90

## 2011-05-20 HISTORY — DX: Occlusion and stenosis of unspecified carotid artery: I65.29

## 2011-05-20 HISTORY — DX: Pulmonary fibrosis, unspecified: J84.10

## 2011-05-20 HISTORY — DX: Chronic diastolic (congestive) heart failure: I50.32

## 2011-05-20 LAB — GLUCOSE, CAPILLARY
Glucose-Capillary: 267 mg/dL — ABNORMAL HIGH (ref 70–99)
Glucose-Capillary: 264 mg/dL — ABNORMAL HIGH (ref 70–99)
Glucose-Capillary: 123 mg/dL — ABNORMAL HIGH (ref 70–99)

## 2011-05-20 LAB — CBC
MCH: 27.2 pg (ref 26.0–34.0)
MCHC: 32 g/dL (ref 30.0–36.0)
MCV: 85 fL (ref 78.0–100.0)
Platelets: 200 10*3/uL (ref 150–400)
RBC: 4.48 MIL/uL (ref 3.87–5.11)
RDW: 13.7 % (ref 11.5–15.5)

## 2011-05-20 LAB — POCT I-STAT, CHEM 8
Calcium, Ion: 1.15 mmol/L (ref 1.12–1.32)
Creatinine, Ser: 0.5 mg/dL (ref 0.50–1.10)
Glucose, Bld: 331 mg/dL — ABNORMAL HIGH (ref 70–99)
HCT: 41 % (ref 36.0–46.0)
Hemoglobin: 13.9 g/dL (ref 12.0–15.0)

## 2011-05-20 LAB — COMPREHENSIVE METABOLIC PANEL
BUN: 10 mg/dL (ref 6–23)
CO2: 27 mEq/L (ref 19–32)
Calcium: 9.5 mg/dL (ref 8.4–10.5)
Chloride: 96 mEq/L (ref 96–112)
Creatinine, Ser: 0.47 mg/dL — ABNORMAL LOW (ref 0.50–1.10)
GFR calc Af Amer: >90 mL/min (ref >90)
GFR calc non Af Amer: >90 mL/min (ref >90)
Glucose, Bld: 264 mg/dL — ABNORMAL HIGH (ref 70–99)
Total Bilirubin: 0.7 mg/dL (ref 0.3–1.2)

## 2011-05-20 LAB — DIFFERENTIAL
Basophils Absolute: 0 10*3/uL (ref 0.0–0.1)
Basophils Relative: 0 % (ref 0–1)
Eosinophils Absolute: 0.1 10*3/uL (ref 0.0–0.7)
Eosinophils Relative: 1 % (ref 0–5)
Lymphs Abs: 1.4 10*3/uL (ref 0.7–4.0)
Neutrophils Relative %: 83 % — ABNORMAL HIGH (ref 43–77)

## 2011-05-20 LAB — PRO B NATRIURETIC PEPTIDE: Pro B Natriuretic peptide (BNP): 348.8 pg/mL (ref 0–450)

## 2011-05-20 LAB — CARDIAC PANEL(CRET KIN+CKTOT+MB+TROPI)
CK, MB: 3.8 ng/mL (ref 0.3–4.0)
CK, MB: 4 ng/mL (ref 0.3–4.0)
Relative Index: INVALID (ref 0.0–2.5)
Total CK: 41 U/L (ref 7–177)
Total CK: 55 U/L (ref 7–177)
Troponin I: <0.3 ng/mL (ref <0.30)

## 2011-05-20 LAB — APTT: aPTT: 42 seconds — ABNORMAL HIGH (ref 24–37)

## 2011-05-20 LAB — PROTIME-INR: Prothrombin Time: 25 seconds — ABNORMAL HIGH (ref 11.6–15.2)

## 2011-05-20 LAB — HEMOGLOBIN A1C
Hgb A1c MFr Bld: 8.5 % — ABNORMAL HIGH (ref <5.7)
Mean Plasma Glucose: 197 mg/dL — ABNORMAL HIGH (ref <117)

## 2011-05-20 LAB — TSH: TSH: 1.427 u[IU]/mL (ref 0.350–4.500)

## 2011-05-20 MED ORDER — ASPIRIN EC 81 MG PO TBEC
81.0000 mg | DELAYED_RELEASE_TABLET | Freq: Every day | ORAL | Status: DC
  Administered 2011-05-20 – 2011-05-21 (×2): 81 mg via ORAL
  Filled 2011-05-20 (×3): qty 1

## 2011-05-20 MED ORDER — NITROGLYCERIN 0.4 MG SL SUBL: 0.4000 mg | SUBLINGUAL_TABLET | SUBLINGUAL | Status: DC | PRN

## 2011-05-20 MED ORDER — NITROGLYCERIN IN D5W 200-5 MCG/ML-% IV SOLN: 3.0000 ug/min | INTRAVENOUS | Status: DC

## 2011-05-20 MED ORDER — SODIUM CHLORIDE 0.9 % IV SOLN: INTRAVENOUS | Status: DC

## 2011-05-20 MED ORDER — DILTIAZEM HCL ER COATED BEADS 300 MG PO CP24
420.0000 mg | ORAL_CAPSULE | Freq: Every day | ORAL | Status: DC
  Administered 2011-05-20 – 2011-05-23 (×4): 420 mg via ORAL
  Filled 2011-05-20 (×4): qty 1

## 2011-05-20 MED ORDER — ASPIRIN 81 MG PO CHEW
324.0000 mg | CHEWABLE_TABLET | Freq: Once | ORAL | Status: AC
  Administered 2011-05-20: 324 mg via ORAL
  Filled 2011-05-20: qty 4

## 2011-05-20 MED ORDER — SODIUM CHLORIDE 0.9 % IV SOLN
Freq: Once | INTRAVENOUS | Status: AC
  Administered 2011-05-20: 75 mL/h via INTRAVENOUS

## 2011-05-20 MED ORDER — LATANOPROST 0.005 % OP SOLN
1.0000 [drp] | Freq: Every day | OPHTHALMIC | Status: DC
  Administered 2011-05-20 – 2011-05-22 (×3): 1 [drp] via OPHTHALMIC
  Filled 2011-05-20: qty 2.5

## 2011-05-20 MED ORDER — ACETAMINOPHEN 325 MG PO TABS: 650.0000 mg | ORAL_TABLET | ORAL | Status: DC | PRN

## 2011-05-20 MED ORDER — SODIUM CHLORIDE 0.9 % IJ SOLN
3.0000 mL | INTRAMUSCULAR | Status: DC | PRN
  Administered 2011-05-22: 13:00:00 via INTRAVENOUS

## 2011-05-20 MED ORDER — SODIUM CHLORIDE 0.9 % IJ SOLN
3.0000 mL | Freq: Two times a day (BID) | INTRAMUSCULAR | Status: DC
  Administered 2011-05-20 – 2011-05-22 (×3): 3 mL via INTRAVENOUS

## 2011-05-20 MED ORDER — METOPROLOL TARTRATE 50 MG PO TABS
50.0000 mg | ORAL_TABLET | Freq: Two times a day (BID) | ORAL | Status: DC
  Administered 2011-05-20 – 2011-05-23 (×7): 50 mg via ORAL
  Filled 2011-05-20 (×3): qty 1
  Filled 2011-05-20: qty 2
  Filled 2011-05-20 (×4): qty 1

## 2011-05-20 MED ORDER — DIGOXIN 125 MCG PO TABS
0.1250 mg | ORAL_TABLET | Freq: Every day | ORAL | Status: DC
  Administered 2011-05-20 – 2011-05-23 (×4): 0.125 mg via ORAL
  Filled 2011-05-20 (×4): qty 1

## 2011-05-20 MED ORDER — VITAMIN D 1000 UNITS PO TABS
1000.0000 [IU] | ORAL_TABLET | Freq: Every day | ORAL | Status: DC
  Administered 2011-05-20 – 2011-05-23 (×4): 1000 [IU] via ORAL
  Filled 2011-05-20 (×4): qty 1

## 2011-05-20 MED ORDER — NITROGLYCERIN IN D5W 200-5 MCG/ML-% IV SOLN
5.0000 ug/min | Freq: Once | INTRAVENOUS | Status: AC
  Administered 2011-05-20: 5 ug/min via INTRAVENOUS
  Filled 2011-05-20: qty 250

## 2011-05-20 MED ORDER — COLESEVELAM HCL 625 MG PO TABS
1250.0000 mg | ORAL_TABLET | Freq: Every day | ORAL | Status: DC
  Administered 2011-05-20 – 2011-05-23 (×4): 1250 mg via ORAL
  Filled 2011-05-20 (×4): qty 2

## 2011-05-20 MED ORDER — INSULIN ASPART 100 UNIT/ML ~~LOC~~ SOLN
0.0000 [IU] | Freq: Three times a day (TID) | SUBCUTANEOUS | Status: DC
  Administered 2011-05-20 (×2): 11 [IU] via SUBCUTANEOUS
  Administered 2011-05-21: 15 [IU] via SUBCUTANEOUS
  Administered 2011-05-21: 4 [IU] via SUBCUTANEOUS
  Administered 2011-05-21: 11 [IU] via SUBCUTANEOUS
  Administered 2011-05-22: 7 [IU] via SUBCUTANEOUS
  Administered 2011-05-22: 4 [IU] via SUBCUTANEOUS
  Administered 2011-05-23: 7 [IU] via SUBCUTANEOUS
  Administered 2011-05-23: 15 [IU] via SUBCUTANEOUS
  Filled 2011-05-20 (×2): qty 3

## 2011-05-20 MED ORDER — GLIMEPIRIDE 4 MG PO TABS
4.0000 mg | ORAL_TABLET | Freq: Every day | ORAL | Status: AC
  Administered 2011-05-21 – 2011-05-22 (×2): 4 mg via ORAL
  Filled 2011-05-20 (×3): qty 1

## 2011-05-20 MED ORDER — BESIFLOXACIN HCL 0.6 % OP SUSP: 1.0000 [drp] | Freq: Four times a day (QID) | OPHTHALMIC | Status: DC | PRN

## 2011-05-20 MED ORDER — FUROSEMIDE 10 MG/ML IJ SOLN
40.0000 mg | INTRAMUSCULAR | Status: AC
  Administered 2011-05-20: 40 mg via INTRAVENOUS
  Filled 2011-05-20: qty 4

## 2011-05-20 MED ORDER — ONDANSETRON HCL 4 MG/2ML IJ SOLN: 4.0000 mg | Freq: Four times a day (QID) | INTRAMUSCULAR | Status: DC | PRN

## 2011-05-20 MED ORDER — POTASSIUM CHLORIDE CRYS ER 20 MEQ PO TBCR
20.0000 meq | EXTENDED_RELEASE_TABLET | Freq: Two times a day (BID) | ORAL | Status: DC
  Administered 2011-05-20 – 2011-05-23 (×7): 20 meq via ORAL
  Filled 2011-05-20 (×8): qty 1

## 2011-05-20 MED ORDER — SODIUM CHLORIDE 0.9 % IV SOLN: 250.0000 mL | INTRAVENOUS | Status: DC

## 2011-05-20 MED ORDER — INSULIN ASPART PROT & ASPART (70-30 MIX) 100 UNIT/ML ~~LOC~~ SUSP
15.0000 [IU] | Freq: Two times a day (BID) | SUBCUTANEOUS | Status: DC
  Administered 2011-05-20 – 2011-05-21 (×2): 15 [IU] via SUBCUTANEOUS
  Administered 2011-05-21 – 2011-05-22 (×2): 30 [IU] via SUBCUTANEOUS
  Administered 2011-05-22: 15 [IU] via SUBCUTANEOUS
  Administered 2011-05-23: 30 [IU] via SUBCUTANEOUS
  Filled 2011-05-20: qty 3

## 2011-05-20 MED ORDER — FUROSEMIDE 10 MG/ML IJ SOLN
40.0000 mg | Freq: Two times a day (BID) | INTRAMUSCULAR | Status: DC
  Administered 2011-05-20 – 2011-05-21 (×3): 40 mg via INTRAVENOUS
  Filled 2011-05-20 (×5): qty 4

## 2011-05-20 MED ORDER — METFORMIN HCL 500 MG PO TABS
1000.0000 mg | ORAL_TABLET | Freq: Two times a day (BID) | ORAL | Status: AC
  Administered 2011-05-20 – 2011-05-22 (×5): 1000 mg via ORAL
  Filled 2011-05-20 (×6): qty 2

## 2011-05-20 NOTE — H&P (Signed)
Admission History and Physical  05/20/2011   Patient ID: Kimani Bedoya  [birth year] 05/20/2011    Admitting Physician:  Dr. Valera Castle   Chief Complaint:  Chest Pain and Dyspnea  History of Present Illness: PCP:  Dr. Christell Constant Primary Cardiologist:  Dr. [name] [facility])  Alexandria [name] is a 77 y.o. female with a h/o CAD, diastolic CHF, atrial fibrillation, interstitial lung disease, HTN, HLP, DM2.  She was in her usual state of health until this past month.  She has noted some chest pressure with exertion that would resolve with rest.  This morning, she awoke around 12am to use the bathroom.  She noted severe chest pressure across her anterior chest with associated arm pain bilaterally and dyspnea.  No associated nausea, diaphoresis, syncope.  She called EMS.  She was given some NTG and ASA and is not sure it helped.  BP was 228/94 upon presentation.  She was placed on NTG gtt and given Lasix IV 40 mg.  She is feeling better.  Breathing is better.  Still has some mild chest pressure.  Initial troponin is negative.  BNP is high and her CXR demonstrates probable edema.  Of note, she has noted + orthopnea recently.  No PND.  No noticeable weight changes.    Past Medical History  Diagnosis Date  . Hypertension   . Glaucoma   . Tremor, essential   . Diabetes mellitus with neuropathy   . B12 deficiency   . Hyperlipidemia   . DM (diabetes mellitus)   . Atrial fibrillation   . Chronic diastolic heart failure     echo 05/2009: mild LVH, EF 55-60%, grade 2 diast dysfxn, mild LAE, PASP  . CAD (coronary artery disease)     LHC 06/2009: pLAD 40%, prox-mid D1 80% (small);  CFX 40-50%, mRCA 20%, EF 65%  -  med Rx  . Carotid stenosis     dopplers 6/12: 1-39% bilat ICA  . Pulmonary fibrosis     Dr. Vassie Loll;  patient taken off O2 in 8/12; dyspnea felt CHF>>ILD  . Skin cancer      Past Surgical History  Procedure Date  . Appendectomy   . Nose surgery       Current Facility-Administered  Medications  Medication Dose Route Frequency Provider Last Rate Last Dose  . 0.9 %  sodium chloride infusion   Intravenous Once Vida Roller, MD 75 mL/hr at 05/20/11 0403 75 mL/hr at 05/20/11 0403  . aspirin chewable tablet 324 mg  324 mg Oral Once Vida Roller, MD   324 mg at 05/20/11 1610  . furosemide (LASIX) injection 40 mg  40 mg Intravenous STAT Vida Roller, MD   40 mg at 05/20/11 0554  . nitroGLYCERIN 0.2 mg/mL in dextrose 5 % infusion  5-200 mcg/min Intravenous Once Vida Roller, MD 6 mL/hr at 05/20/11 0517 20 mcg/min at 05/20/11 9604   Current Outpatient Prescriptions  Medication Sig Dispense Refill  . aspirin (LONGS ADULT LOW STRENGTH ASA) 81 MG EC tablet Take 81 mg by mouth daily.        Marland Kitchen aspirin 81 MG chewable tablet Chew 324 mg by mouth once.        Marland Kitchen Besifloxacin HCl (BESIVANCE) 0.6 % SUSP Place 1 drop into both eyes 4 (four) times daily as needed. For 2 days following eye injections       . Cholecalciferol (VITAMIN D) 1000 UNITS capsule Take 1,000 Units by mouth daily. OTC        .  colesevelam (WELCHOL) 625 MG tablet Take 1,250 mg by mouth daily.        . digoxin (LANOXIN) 0.125 MG tablet TAKE ONE TABLET BY MOUTH ONE TIME DAILY  30 tablet  12  . furosemide (LASIX) 40 MG tablet TAKE 2 TABLETS BY MOUTH TWICE DAILY  120 tablet  5  . glimepiride (AMARYL) 4 MG tablet Take 1 tablet by mouth Twice daily.      Marland Kitchen KLOR-CON M10 10 MEQ tablet TAKE ONE TABLET BY MOUTH ONE TIME DAILY  30 each  3  . latanoprost (XALATAN) 0.005 % ophthalmic solution 1 drop at bedtime.        Marland Kitchen NOVOLOG MIX 70/30 FLEXPEN (70-30) 100 UNIT/ML injection Inject 15-30 Units into the skin Twice daily. 30 in the morning  15 at night      . warfarin (COUMADIN) 5 MG tablet Take 5 mg by mouth daily. 2 on Monday and 1 1/2 all other days      . nitroGLYCERIN (NITROSTAT) 0.4 MG SL tablet Place 0.4 mg under the tongue every 5 (five) minutes as needed. As needed for chest pain         Allergies: Allergies    Allergen Reactions  . Ace Inhibitors Cough    Enalapril  benicor    . Crestor (Rosuvastatin Calcium)     Fatigue and leg pain   . Lipitor (Atorvastatin Calcium)     Myalgias   . Niaspan (Niacin)     Intol   . Prednisone     REACTION: "retained fluid"     History   Social History  . Marital Status: Widowed    Spouse Name: N/A    Number of Children: N/A  . Years of Education: N/A   Social History Main Topics  . Smoking status: Never Smoker   . Smokeless tobacco: Never Used  . Alcohol Use: No  . Drug Use: No  . Sexually Active: None   Other Topics Concern  . None   Social History Narrative  . None     Family History  Problem Relation Age of Onset  . Throat cancer Brother   . Prostate cancer Father   . Diabetes Mother   . Diabetes Brother     multiple  . Heart disease Brother      ROS:  Please see the history of present illness.   General ROS: positive for  - fatigue.  Respiratory ROS: negative for - cough, hemoptysis or sputum changes  All other systems reviewed and negative.    Vital Signs: BP 152/67  Pulse 109  Temp(Src) 98.2 F (36.8 C) (Oral)  Resp 21  Ht 5\' 2"  (1.575 m)  Wt 160 lb (72.576 kg)  BMI 29.26 kg/m2  SpO2 98%   PHYSICAL EXAM: General:  Well nourished, well developed, in no acute distress HEENT: normal Lymph: no adenopathy Neck: no JVD Endocrine:  No thryomegaly Vascular: No carotid bruits; DP/PT 2+ bilaterally  Cardiac:  normal S1, S2; RRR; 1/6 systolic murmur RUSB Lungs:  Bibasilar rales, no wheezing Abd: soft, nontender, no hepatomegaly Ext: trace bilateral edema Musculoskeletal:  No deformities Skin: warm and dry Neuro:  CNs 2-12 intact, no focal abnormalities noted Psych:  Normal affect   EKG:  Sinus Tachy; HR 109, normal axis, PRWP, 2mm ST depression V5-6  Labs and Imaging: Results for orders placed during the hospital encounter of 05/20/11 (from the past 48 hour(s))  PRO B NATRIURETIC PEPTIDE     Status: Normal  Collection Time   05/20/11  3:33 AM      Component Value Range Comment   BNP, POC 348.8  0 - 450 (pg/mL)   CBC     Status: Abnormal   Collection Time   05/20/11  3:33 AM      Component Value Range Comment   WBC 14.3 (*) 4.0 - 10.5 (K/uL)    RBC 4.48  3.87 - 5.11 (MIL/uL)    Hemoglobin 12.2  12.0 - 15.0 (g/dL)    HCT 40.9  81.1 - 91.4 (%)    MCV 85.0  78.0 - 100.0 (fL)    MCH 27.2  26.0 - 34.0 (pg)    MCHC 32.0  30.0 - 36.0 (g/dL)    RDW 78.2  95.6 - 21.3 (%)    Platelets 200  150 - 400 (K/uL)   DIFFERENTIAL     Status: Abnormal   Collection Time   05/20/11  3:33 AM      Component Value Range Comment   Neutrophils Relative 83 (*) 43 - 77 (%)    Neutro Abs 11.9 (*) 1.7 - 7.7 (K/uL)    Lymphocytes Relative 10 (*) 12 - 46 (%)    Lymphs Abs 1.4  0.7 - 4.0 (K/uL)    Monocytes Relative 7  3 - 12 (%)    Monocytes Absolute 1.0  0.1 - 1.0 (K/uL)    Eosinophils Relative 1  0 - 5 (%)    Eosinophils Absolute 0.1  0.0 - 0.7 (K/uL)    Basophils Relative 0  0 - 1 (%)    Basophils Absolute 0.0  0.0 - 0.1 (K/uL)   APTT     Status: Abnormal   Collection Time   05/20/11  3:33 AM      Component Value Range Comment   aPTT 42 (*) 24 - 37 (seconds)   PROTIME-INR     Status: Abnormal   Collection Time   05/20/11  3:33 AM      Component Value Range Comment   Prothrombin Time 25.0 (*) 11.6 - 15.2 (seconds)    INR 2.22 (*) 0.00 - 1.49    POCT I-STAT TROPONIN I     Status: Normal   Collection Time   05/20/11  3:48 AM      Component Value Range Comment   Troponin i, poc 0.01  0.00 - 0.08 (ng/mL)    Comment 3            POCT I-STAT, CHEM 8     Status: Abnormal   Collection Time   05/20/11  3:51 AM      Component Value Range Comment   Sodium 138  135 - 145 (mEq/L)    Potassium 4.2  3.5 - 5.1 (mEq/L)    Chloride 100  96 - 112 (mEq/L)    BUN 15  6 - 23 (mg/dL)    Creatinine, Ser 0.86  0.50 - 1.10 (mg/dL)    Glucose, Bld 578 (*) 70 - 99 (mg/dL)    Calcium, Ion 4.69  1.12 - 1.32 (mmol/L)     TCO2 25  0 - 100 (mmol/L)    Hemoglobin 13.9  12.0 - 15.0 (g/dL)    HCT 62.9  52.8 - 41.3 (%)    Dg Chest 2 View  05/20/2011  *RADIOLOGY REPORT*  Clinical Data: Chest pain, shortness of breath.  CHEST - 2 VIEW  Comparison: 08/04/2010  Findings: Cardiomegaly.  Central vascular congestion.  Interstitial prominence is similar to prior. Mild bibasilar  opacities. Calcified granuloma left lower lobe.  No pneumothorax or pleural effusion.  No acute osseous abnormality.  IMPRESSION: Cardiomegaly with central vascular congestion.  Chronic interstitial prominence.  Cannot exclude interstitial edema in this setting.  Mild bibasilar opacities are nonspecific though favored to represent scarring or atelectasis.  Original Report Authenticated By: Waneta Martins, M.D.    ASSESSMENT AND PLAN:  1.  Acute on chronic diastolic CHF --  Chest pain possibly from acute CHF vs ACS.  Initial Tn neg.  Admit to step down bed.  Will cycle markers.  Continue Lasix 40 mg IV BID.  K+ 20 mEq bid.  Monitor renal fxn and K+.  Monitor weights and I's and O's.     2.  Chest pain --  Initial Troponin negative.  Need to rule out ACS.  Will cycle markers.  Continue IV NTG.  Hold coumadin.  Start Heparin once INR < 2.  Lateral ST depression may be from diagonal disease in setting of HTN crisis with CHF.    3.  CAD --  As noted, cycle markers, Rx with NTG.  Continue ASA.  Heparin once INR < 2.  4.  Hypertension with hypertensive crisis -- Continue current diltiazem 420 mg QD and increase metoprolol from 25 to 50 mg BID.  Continue NTG gtt.     5.  Diabetes --  Continue current diabetic meds.  She is on amaryl, metformin and Insulin 70/30  6.  Hyperlipidemia  -- intolerant to statins in the past.  7.  Atrial fibrillation  -- In NSR.  Will hold coumadin as noted above.  Increase metoprolol for better heart rate control.  Tereso Newcomer, PA-C  05/20/2011, 8:34 AM    I have taken a history, reviewed medications, allergies, PMH,  SH, FH, and reviewed ROS and examined the patient.  I agree with the assessment and plan. Will increase Lopressor to 50mg  po bid for rate control. Diurese overnight. If cardiac markers are negative, will discharge in am if CHF resolved and pain free. Medical therapy with close followup with Dr Antoine Poche in [place].  Felicidad Sugarman C. Daleen Squibb, MD, [facility] Pager:  [phone]

## 2011-05-20 NOTE — ED Notes (Signed)
MD at bedside. Dr.Miller with patient

## 2011-05-20 NOTE — Progress Notes (Signed)
ANTICOAGULATION CONSULT NOTE - Follow Up Consult  Pharmacy Consult for heparin Indication: while off warfarin  Allergies  Allergen Reactions  . Ace Inhibitors Cough    Enalapril  benicor    . Crestor (Rosuvastatin Calcium)     Fatigue and leg pain   . Lipitor (Atorvastatin Calcium)     Myalgias   . Niaspan (Niacin)     Intol   . Prednisone     REACTION: "retained fluid"    Patient Measurements: Height: 5\' 2"  (157.5 cm) Weight: 160 lb 7.9 oz (72.8 kg) IBW/kg (Calculated) : 50.1    Vital Signs: Temp: 98.2 F (36.8 C) (11/22 1543) Temp src: Oral (11/22 1543) BP: 127/95 mmHg (11/22 1543) Pulse Rate: 88  (11/22 1212)  Labs:  Basename 05/20/11 1728 05/20/11 1727 05/20/11 1202 05/20/11 1201 05/20/11 0351 05/20/11 0333  HGB -- -- -- -- 13.9 12.2  HCT -- -- -- -- 41.0 38.1  PLT -- -- -- -- -- 200  APTT -- -- -- -- -- 42*  LABPROT -- 24.6* -- -- -- 25.0*  INR -- 2.18* -- -- -- 2.22*  HEPARINUNFRC -- -- -- -- -- --  CREATININE -- -- -- 0.47* 0.50 --  CKTOTAL 55 -- 41 -- -- --  CKMB 4.0 -- 3.8 -- -- --  TROPONINI <0.30 -- <0.30 -- -- --   Estimated Creatinine Clearance: 53.3 ml/min (by C-G formula based on Cr of 0.47).   Medications:  Scheduled:    . sodium chloride   Intravenous Once  . aspirin  324 mg Oral Once  . aspirin EC  81 mg Oral Daily  . cholecalciferol  1,000 Units Oral Daily  . colesevelam  1,250 mg Oral Daily  . digoxin  0.125 mg Oral Daily  . diltiazem  420 mg Oral Daily  . furosemide  40 mg Intravenous STAT  . furosemide  40 mg Intravenous BID  . glimepiride  4 mg Oral Q breakfast  . insulin aspart  0-20 Units Subcutaneous TID WC  . insulin aspart protamine-insulin aspart  15-30 Units Subcutaneous BID WC  . latanoprost  1 drop Both Eyes QHS  . metFORMIN  1,000 mg Oral BID WC  . metoprolol tartrate  50 mg Oral BID  . nitroGLYCERIN  5-200 mcg/min Intravenous Once  . potassium chloride  20 mEq Oral BID  . sodium chloride  3 mL Intravenous Q12H      Assessment: 77 yo F on warfarin at home for afib for heparin bridge during cardiac work-up.  INR still >2.   Goal of Therapy:  Heparin level 0.3-0.7 units/ml   Plan:  Will not start heparin until INR<2.  F/u am INR.   Evonda Enge L. Illene Bolus, PharmD, BCPS Clinical Pharmacist Pager: [phone] 05/20/2011 7:36 PM

## 2011-05-20 NOTE — ED Notes (Signed)
Pt transported on O2 on monitor.

## 2011-05-20 NOTE — ED Notes (Signed)
Attempted to give report to floor. State can not take report at this time. States will call back to [phone]

## 2011-05-20 NOTE — Progress Notes (Signed)
ANTICOAGULATION CONSULT NOTE - Initial Consult  Pharmacy Consult for Hepain  Indication:  Bridge while off Warfarin  Allergies  Allergen Reactions  . Ace Inhibitors Cough    Enalapril  benicor    . Crestor (Rosuvastatin Calcium)     Fatigue and leg pain   . Lipitor (Atorvastatin Calcium)     Myalgias   . Niaspan (Niacin)     Intol   . Prednisone     REACTION: "retained fluid"    Patient Measurements: Height: 5\' 2"   Weight: 160 lb 7.9 oz  IBW/kg (Calculated) : 50.1  Adjusted Body Weight: 56.9  Vital Signs: Temp: 98.5 F (36.9 C) (11/22 1159) Temp src: Oral (11/22 1030) BP: 135/101 mmHg (11/22 1200) Pulse Rate: 88  (11/22 1212)  Labs:  Basename 05/20/11 0351 05/20/11 0333  HGB 13.9 12.2  HCT 41.0 38.1  PLT -- 200  APTT -- 42*  LABPROT -- 25.0*  INR -- 2.22*  CREATININE 0.50 --   Estimated Creatinine Clearance: 53.3 ml/min (by C-G formula based on Cr of 0.5).  Medical History: Past Medical History  Diagnosis Date  . Hypertension   . Glaucoma   . Tremor, essential   . Diabetes mellitus with neuropathy   . B12 deficiency   . Hyperlipidemia   . DM (diabetes mellitus)   . Atrial fibrillation   . Chronic diastolic heart failure     echo 05/2009: mild LVH, EF 55-60%, grade 2 diast dysfxn, mild LAE, PASP  . CAD (coronary artery disease)     LHC 06/2009: pLAD 40%, prox-mid D1 80% (small);  CFX 40-50%, mRCA 20%, EF 65%  -  med Rx  . Carotid stenosis     dopplers 6/12: 1-39% bilat ICA  . Pulmonary fibrosis     Dr. Vassie Loll;  patient taken off O2 in 8/12; dyspnea felt CHF>>ILD  . Skin cancer   . Shortness of breath   . CHF (congestive heart failure)     Medications:  Prescriptions prior to admission  Medication Sig Dispense Refill  . aspirin (LONGS ADULT LOW STRENGTH ASA) 81 MG EC tablet Take 81 mg by mouth daily.        Marland Kitchen aspirin 81 MG chewable tablet Chew 324 mg by mouth once.        Marland Kitchen Besifloxacin HCl (BESIVANCE) 0.6 % SUSP Place 1 drop into both eyes 4  (four) times daily as needed. For 2 days following eye injections       . Cholecalciferol (VITAMIN D) 1000 UNITS capsule Take 1,000 Units by mouth daily. OTC        . colesevelam (WELCHOL) 625 MG tablet Take 1,250 mg by mouth daily.        . digoxin (LANOXIN) 0.125 MG tablet TAKE ONE TABLET BY MOUTH ONE TIME DAILY  30 tablet  12  . furosemide (LASIX) 40 MG tablet TAKE 2 TABLETS BY MOUTH TWICE DAILY  120 tablet  5  . glimepiride (AMARYL) 4 MG tablet Take 1 tablet by mouth Twice daily.      Marland Kitchen KLOR-CON M10 10 MEQ tablet TAKE ONE TABLET BY MOUTH ONE TIME DAILY  30 each  3  . latanoprost (XALATAN) 0.005 % ophthalmic solution 1 drop at bedtime.        Marland Kitchen NOVOLOG MIX 70/30 FLEXPEN (70-30) 100 UNIT/ML injection Inject 15-30 Units into the skin Twice daily. 30 in the morning  15 at night      . warfarin (COUMADIN) 5 MG tablet Take 5 mg by  mouth daily. 2 on Monday and 1 1/2 all other days      . nitroGLYCERIN (NITROSTAT) 0.4 MG SL tablet Place 0.4 mg under the tongue every 5 (five) minutes as needed. As needed for chest pain        Assessment: 77 yo female with a h/o CAD, diastolic CHF, atrial fibrillation, interstitial lung disease, HTN, HLP, DM2. She noted some severe chest pressure across her anterior chest with associated arm pain bilaterally and dyspnea.  She has been admitted for cardiac work up to r/o MI.  She has been on Warfarin therapy for her A.Fib and this is now held with plans to bridge her with Heparin when her INR is < 2.0.   Goal of Therapy:  Heparin level 0.3-0.7 units/ml   Plan:  Will repeat PT\INR tonight at 6 PM.  Start IV heparin if it is < 2.0.    Nadara Mustard [place] 05/20/2011,12:58 PM

## 2011-05-20 NOTE — ED Provider Notes (Signed)
History     CSN: [id number] Arrival date & time: 05/20/2011  3:14 AM   First MD Initiated Contact with Patient 05/20/11 [phone]      Chief Complaint  Patient presents with  . Chest Pain    chest pain and sob in excertion this am, patient stated she got up to go to bathroom and she felt pain on her chest and sob    (Consider location/radiation/quality/duration/timing/severity/associated sxs/prior treatment) HPI Comments: EMS found the patient to be hypertensive, tachycardic and in mild respiratory distress with expiratory wheezing prior to arrival. Her blood pressure 229/84, heart rate 115. The patient states that she has had some exertional chest pain and dyspnea over the last several days, this improves with rest. She notes that tonight came on severe. EMS found the patient to be wheezing and gave her an albuterol neb and route. The patient states this has helped somewhat. The patient notes that she was admitted in the last 2 years for atrial fibrillation and spent 2 weeks in the hospital. She also admits to having a cardiac catheterization which showed no obstructive disease in the last 2 years. She has been seen by [facility] cardiology Dr. Antoine Poche, but her primary doctor is [facility]  Patient is a 77 y.o. female presenting with chest pain. The history is provided by the patient, the spouse, medical records and the EMS personnel.  Chest Pain The chest pain began 1 - 2 hours ago. Duration of episode(s) is 2 hours. Chest pain occurs constantly. The chest pain is improving. Associated with: exertion - noted that she had exertional chest pain several nights ago while raking leaves in her driveway. This went away after 5 minutes of rest. The severity of the pain is moderate. The quality of the pain is described as heavy (Heaviness). Radiates to: Bilateral arms. Chest pain is worsened by exertion. Primary symptoms include shortness of breath and wheezing. Pertinent negatives for  primary symptoms include no fever, no fatigue, no syncope, no cough, no abdominal pain, no vomiting and no dizziness.     Past Medical History  Diagnosis Date  . Hypertension   . Glaucoma   . Tremor, essential   . Diabetes mellitus with neuropathy   . B12 deficiency   . Hyperlipidemia   . DM (diabetes mellitus)   . Atrial fibrillation   . Diastolic heart failure   . Tremor     Past Surgical History  Procedure Date  . Appendectomy   . Nose surgery     Family History  Problem Relation Age of Onset  . Throat cancer Brother   . Prostate cancer Father   . Diabetes Mother   . Diabetes Brother     multiple  . Heart disease Brother     History  Substance Use Topics  . Smoking status: Never Smoker   . Smokeless tobacco: Never Used  . Alcohol Use: No    OB History    Grav Para Term Preterm Abortions TAB SAB Ect Mult Living                  Review of Systems  Constitutional: Negative for fever and fatigue.  Respiratory: Positive for shortness of breath and wheezing. Negative for cough.   Cardiovascular: Positive for chest pain. Negative for syncope.  Gastrointestinal: Negative for vomiting and abdominal pain.  Neurological: Negative for dizziness.  All other systems reviewed and are negative.    Allergies  Ace inhibitors; Crestor; Lipitor; Niaspan; and Prednisone  Home Medications   Current Outpatient Rx  Name Route Sig Dispense Refill  . AMLODIPINE BESYLATE 10 MG PO TABS Oral Take 10 mg by mouth daily.      . ASPIRIN 81 MG PO TBEC Oral Take 81 mg by mouth daily.      Marland Kitchen BESIVANCE 0.6 % OP SUSP  prn    . VITAMIN D 1000 UNITS PO CAPS Oral Take 1,000 Units by mouth daily. OTC      . DIGOXIN 0.125 MG PO TABS  TAKE ONE TABLET BY MOUTH ONE TIME DAILY 30 tablet 12  . FUROSEMIDE 40 MG PO TABS  TAKE 2 TABLETS BY MOUTH TWICE DAILY 120 tablet 5  . GLIMEPIRIDE 4 MG PO TABS Oral Take 1 tablet by mouth Twice daily.    Marland Kitchen MOTRIN IB PO Oral Take by mouth as needed. OTC        . INSULIN DETEMIR 100 UNIT/ML  SOLN Subcutaneous Inject 28 Units into the skin at bedtime. An 16 units at night    . ISOSORBIDE MONONITRATE ER 30 MG PO TB24 Oral Take 1 tablet (30 mg total) by mouth daily. 30 tablet 11  . KLOR-CON M10 10 MEQ PO TBCR  TAKE ONE TABLET BY MOUTH ONE TIME DAILY 30 each 3  . LATANOPROST 0.005 % OP SOLN  1 drop at bedtime.      Marland Kitchen MATZIM LA 420 MG PO TB24  TAKE ONE TABLET BY MOUTH ONE TIME DAILY 30 tablet 3  . METFORMIN HCL 1000 MG PO TABS Oral Take 1,000 mg by mouth 2 (two) times daily with a meal.      . METOPROLOL TARTRATE 25 MG PO TABS  TAKE ONE TABLET BY MOUTH TWICE DAILY 60 tablet 3  . NITROGLYCERIN 0.4 MG SL SUBL Sublingual Place 0.4 mg under the tongue every 5 (five) minutes as needed. As needed     . WARFARIN SODIUM 5 MG PO TABS  2 on Monday and 1 1/2 all other days      BP 193/72  Pulse 114  Temp(Src) 99.3 F (37.4 C) (Oral)  Resp 30  Ht 5\' 2"  (1.575 m)  Wt 160 lb (72.576 kg)  BMI 29.26 kg/m2  SpO2 96%  Physical Exam  Nursing note and vitals reviewed. Constitutional: She appears well-developed and well-nourished. She appears distressed ( Moderate respiratory distress).  HENT:  Head: Normocephalic and atraumatic.  Mouth/Throat: Oropharynx is clear and moist. No oropharyngeal exudate.  Eyes: Conjunctivae and EOM are normal. Pupils are equal, round, and reactive to light. Right eye exhibits no discharge. Left eye exhibits no discharge. No scleral icterus.  Neck: Normal range of motion. Neck supple. No JVD present. No thyromegaly present.  Cardiovascular: Regular rhythm, normal heart sounds and intact distal pulses.  Exam reveals no gallop and no friction rub.   No murmur heard.      Tachycardia, irregular  Pulmonary/Chest: She is in respiratory distress ( Tachypnea, increased work of breathing, speaks in truncated sentences). She has no wheezes. She has rales ( Bilateral lower lobe rales).  Abdominal: Soft. Bowel sounds are normal. She exhibits  no distension and no mass. There is no tenderness.  Musculoskeletal: Normal range of motion. She exhibits no edema and no tenderness.  Lymphadenopathy:    She has no cervical adenopathy.  Neurological: She is alert. Coordination normal.  Skin: Skin is warm and dry. No rash noted. No erythema.  Psychiatric: She has a normal mood and affect. Her behavior is normal.  ED Course  Procedures (including critical care time)   Labs Reviewed  PRO B NATRIURETIC PEPTIDE  I-STAT TROPONIN I  I-STAT, CHEM 8  CBC  DIFFERENTIAL  APTT  PROTIME-INR   No results found.   No diagnosis found.    MDM  Hypertension, rales on exam, tachypnea, EKG changes consistent with ischemia or new onset congestive heart failure possibly related to hypertension. Will workup with chest x-ray, labs, nitroglycerin drip, aspirin, oxygen supplementation.  Anticipate admission   ED ECG REPORT   Date: 05/20/2011   Rate: 109  Rhythm: sinus tachycardia with frequent ectopy  QRS Axis: normal  Intervals: PR prolonged  ST/T Wave abnormalities: ST depressions laterally  Conduction Disutrbances:first-degree A-V block   Narrative Interpretation:   Old EKG Reviewed: changes noted and Since last tracing, lateral ST depression now present, atrial fibrillation has resolved and has been replaced with normal sinus rhythm with frequent ectopy   EKG significantly abnormal with ST depressions in the lateral leads, blood work significant for appropriate anticoagulation, white blood cell count of 14.3 and a chest x-ray showing interstitial disease possibly pulmonary edema. Given her hypertensive crisis I suspect this is related. Nitroglycerin drip with some improvement. Discussed with cardiology who will have patient seen at change of shift within the next 45 minutes.  CRITICAL CARE Performed by: Vida Roller   Total critical care time: 30  Critical care time was exclusive of separately billable procedures and treating  other patients.  Critical care was necessary to treat or prevent imminent or life-threatening deterioration.  Critical care was time spent personally by me on the following activities: development of treatment plan with patient and/or surrogate as well as nursing, discussions with consultants, evaluation of patient's response to treatment, examination of patient, obtaining history from patient or surrogate, ordering and performing treatments and interventions, ordering and review of laboratory studies, ordering and review of radiographic studies, pulse oximetry and re-evaluation of patient's condition.   Patient has significantly improved on a nitroglycerin drip, states that she has a mild headache but declines pain medications. Currently at 20 mic per kilo per minute check her blood pressure is 165/  90  Vida Roller, MD 05/20/11 [phone]

## 2011-05-21 DIAGNOSIS — I5033 Acute on chronic diastolic (congestive) heart failure: Secondary | ICD-10-CM

## 2011-05-21 LAB — BASIC METABOLIC PANEL
BUN: 16 mg/dL (ref 6–23)
Chloride: 100 mEq/L (ref 96–112)
GFR calc Af Amer: >90 mL/min (ref >90)
GFR calc non Af Amer: 86 mL/min — ABNORMAL LOW (ref >90)
Potassium: 4.2 mEq/L (ref 3.5–5.1)
Sodium: 139 mEq/L (ref 135–145)

## 2011-05-21 LAB — GLUCOSE, CAPILLARY
Glucose-Capillary: 301 mg/dL — ABNORMAL HIGH (ref 70–99)
Glucose-Capillary: 196 mg/dL — ABNORMAL HIGH (ref 70–99)
Glucose-Capillary: 157 mg/dL — ABNORMAL HIGH (ref 70–99)

## 2011-05-21 LAB — PROTIME-INR
INR: 1.79 — ABNORMAL HIGH (ref 0.00–1.49)
Prothrombin Time: 21.1 seconds — ABNORMAL HIGH (ref 11.6–15.2)

## 2011-05-21 LAB — LIPID PANEL
Cholesterol: 185 mg/dL (ref 0–200)
HDL: 47 mg/dL (ref >39)

## 2011-05-21 LAB — HEPARIN LEVEL (UNFRACTIONATED): Heparin Unfractionated: <0.1 IU/mL — ABNORMAL LOW (ref 0.30–0.70)

## 2011-05-21 MED ORDER — WARFARIN SODIUM 10 MG PO TABS
10.0000 mg | ORAL_TABLET | Freq: Once | ORAL | Status: AC
  Administered 2011-05-21: 10 mg via ORAL
  Filled 2011-05-21: qty 1

## 2011-05-21 MED ORDER — FUROSEMIDE 10 MG/ML IJ SOLN
80.0000 mg | Freq: Two times a day (BID) | INTRAMUSCULAR | Status: DC
  Administered 2011-05-21: 80 mg via INTRAVENOUS
  Filled 2011-05-21 (×3): qty 8

## 2011-05-21 MED ORDER — HEPARIN (PORCINE) IN NACL 100-0.45 UNIT/ML-% IJ SOLN
1000.0000 [IU]/h | INTRAMUSCULAR | Status: DC
  Administered 2011-05-21: 1000 [IU]/h via INTRAVENOUS
  Filled 2011-05-21 (×2): qty 250

## 2011-05-21 MED ORDER — WARFARIN VIDEO: Freq: Once | Status: DC

## 2011-05-21 MED ORDER — COUMADIN BOOK
Freq: Once | Status: AC
  Administered 2011-05-21: 17:00:00
  Filled 2011-05-21: qty 1

## 2011-05-21 NOTE — Progress Notes (Signed)
ANTICOAGULATION CONSULT NOTE - Follow Up Consult  Pharmacy Consult for heparin Indication: atrial fibrillation (bridge while Coumadin on hold)  Allergies  Allergen Reactions  . Ace Inhibitors Cough    Enalapril  benicor    . Crestor (Rosuvastatin Calcium)     Fatigue and leg pain   . Lipitor (Atorvastatin Calcium)     Myalgias   . Niaspan (Niacin)     Intol   . Prednisone     REACTION: "retained fluid"    Patient Measurements: Height: 5\' 2"  (157.5 cm) Weight: 159 lb 9.8 oz (72.4 kg) IBW/kg (Calculated) : 50.1  Adjusted Body Weight: 65.6  Vital Signs: Temp: 98.3 F (36.8 C) (11/23 0400) Temp src: Oral (11/23 0400) BP: 138/60 mmHg (11/23 0400) Pulse Rate: 82  (11/22 2130)  Labs:  Basename 05/21/11 0630 05/20/11 1728 05/20/11 1727 05/20/11 1202 05/20/11 1201 05/20/11 0351 05/20/11 0333  HGB -- -- -- -- -- 13.9 12.2  HCT -- -- -- -- -- 41.0 38.1  PLT -- -- -- -- -- -- 200  APTT -- -- -- -- -- -- 42*  LABPROT 21.1* -- 24.6* -- -- -- 25.0*  INR 1.79* -- 2.18* -- -- -- 2.22*  HEPARINUNFRC -- -- -- -- -- -- --  CREATININE -- -- -- -- 0.47* 0.50 --  CKTOTAL -- 55 -- 41 -- -- --  CKMB -- 4.0 -- 3.8 -- -- --  TROPONINI -- <0.30 -- <0.30 -- -- --   Estimated Creatinine Clearance: 53.1 ml/min (by C-G formula based on Cr of 0.47).  Medications:  Scheduled:    . aspirin EC  81 mg Oral Daily  . cholecalciferol  1,000 Units Oral Daily  . colesevelam  1,250 mg Oral Daily  . digoxin  0.125 mg Oral Daily  . diltiazem  420 mg Oral Daily  . furosemide  40 mg Intravenous BID  . glimepiride  4 mg Oral Q breakfast  . insulin aspart  0-20 Units Subcutaneous TID WC  . insulin aspart protamine-insulin aspart  15-30 Units Subcutaneous BID WC  . latanoprost  1 drop Both Eyes QHS  . metFORMIN  1,000 mg Oral BID WC  . metoprolol tartrate  50 mg Oral BID  . potassium chloride  20 mEq Oral BID  . sodium chloride  3 mL Intravenous Q12H    Assessment: 77yo female now with  subtherapeutic INR with Coumadin on hold for cardiac w/u, to begin heparin as bridge.  Goal of Therapy:  Heparin level 0.3-0.7 units/ml   Plan:  Will begin heparin gtt at 1000 units/hr without bolus given elevated INR.  Monitor heparin levels and CBC.  Colleen Can  PharmD BCPS 05/21/2011,7:37 AM

## 2011-05-21 NOTE — Progress Notes (Signed)
Subjective:   Breathing is better.  No chest pain. Objective: Filed Vitals:   05/21/11 1200 05/21/11 1211 05/21/11 1222 05/21/11 1545  BP: 171/80 142/103 158/76 140/49  Pulse:      Temp: 98.5 F (36.9 C)   98.3 F (36.8 C)  TempSrc: Oral   Oral  Resp: 22   2  Height:      Weight:      SpO2: 90%   96%   Weight change: 7.9 oz (0.225 kg)  Intake/Output Summary (Last 24 hours) at 05/21/11 1653 Last data filed at 05/21/11 1600  Gross per 24 hour  Intake   1236 ml  Output   1351 ml  Net   -115 ml    General: Alert, awake, oriented x3, in no acute distress.  HEENT: No bruits, no goiter Neck:  JVP is increased.Marland Kitchen  Heart: Regular rate and rhythm, without murmurs, rubs, gallops.  Lungs: Crackles bilateral bases. bilateral air movement.  Abdomen: Soft, nontender, nondistended, positive bowel sounds.  Ext:  Tr edema. Neuro: Grossly intact, nonfocal.   Lab Results: Results for orders placed during the hospital encounter of 05/20/11 (from the past 24 hour(s))  PROTIME-INR     Status: Abnormal   Collection Time   05/20/11  5:27 PM      Component Value Range   Prothrombin Time 24.6 (*) 11.6 - 15.2 (seconds)   INR 2.18 (*) 0.00 - 1.49   CARDIAC PANEL(CRET KIN+CKTOT+MB+TROPI)     Status: Normal   Collection Time   05/20/11  5:28 PM      Component Value Range   Total CK 55  7 - 177 (U/L)   CK, MB 4.0  0.3 - 4.0 (ng/mL)   Troponin I <0.30  <0.30 (ng/mL)   Relative Index RELATIVE INDEX IS INVALID  0.0 - 2.5   GLUCOSE, CAPILLARY     Status: Abnormal   Collection Time   05/20/11  9:36 PM      Component Value Range   Glucose-Capillary 123 (*) 70 - 99 (mg/dL)   Comment 1 Notify RN     Comment 2 Documented in Chart    PROTIME-INR     Status: Abnormal   Collection Time   05/21/11  6:30 AM      Component Value Range   Prothrombin Time 21.1 (*) 11.6 - 15.2 (seconds)   INR 1.79 (*) 0.00 - 1.49   BASIC METABOLIC PANEL     Status: Abnormal   Collection Time   05/21/11  6:30 AM   Component Value Range   Sodium 139  135 - 145 (mEq/L)   Potassium 4.2  3.5 - 5.1 (mEq/L)   Chloride 100  96 - 112 (mEq/L)   CO2 28  19 - 32 (mEq/L)   Glucose, Bld 309 (*) 70 - 99 (mg/dL)   BUN 16  6 - 23 (mg/dL)   Creatinine, Ser 0.45  0.50 - 1.10 (mg/dL)   Calcium 40.9  8.4 - 10.5 (mg/dL)   GFR calc non Af Amer 86 (*) >90 (mL/min)   GFR calc Af Amer >90  >90 (mL/min)  LIPID PANEL     Status: Abnormal   Collection Time   05/21/11  6:30 AM      Component Value Range   Cholesterol 185  0 - 200 (mg/dL)   Triglycerides 811  <914 (mg/dL)   HDL 47  >78 (mg/dL)   Total CHOL/HDL Ratio 3.9     VLDL 27  0 - 40 (mg/dL)  LDL Cholesterol 111 (*) 0 - 99 (mg/dL)  GLUCOSE, CAPILLARY     Status: Abnormal   Collection Time   05/21/11  8:20 AM      Component Value Range   Glucose-Capillary 301 (*) 70 - 99 (mg/dL)  GLUCOSE, CAPILLARY     Status: Abnormal   Collection Time   05/21/11 12:05 PM      Component Value Range   Glucose-Capillary 196 (*) 70 - 99 (mg/dL)  HEPARIN LEVEL (UNFRACTIONATED)     Status: Abnormal   Collection Time   05/21/11  3:36 PM      Component Value Range   Heparin Unfractionated <0.10 (*) 0.30 - 0.70 (IU/mL)    Studies/Results: Dg Chest 2 View  05/20/2011  *RADIOLOGY REPORT*  Clinical Data: Chest pain, shortness of breath.  CHEST - 2 VIEW  Comparison: 08/04/2010  Findings: Cardiomegaly.  Central vascular congestion.  Interstitial prominence is similar to prior. Mild bibasilar opacities. Calcified granuloma left lower lobe.  No pneumothorax or pleural effusion.  No acute osseous abnormality.  IMPRESSION: Cardiomegaly with central vascular congestion.  Chronic interstitial prominence.  Cannot exclude interstitial edema in this setting.  Mild bibasilar opacities are nonspecific though favored to represent scarring or atelectasis.  Original Report Authenticated By: Waneta Martins, M.D.    Medications: I have reviewed the patient's current medications.   Patient  Active Hospital Problem List: DIABETES MELLITUS (07/28/2007)   Assessment: Continue current regimen.  Has been increased.  Follow CS.  Dietary to see to reeducate on DM.   Plan:  HYPERLIPIDEMIA (07/28/2007)   Assessment: Keep on Welchol.  Inolerant to statins.   Plan: HYPERTENSION (07/28/2007)   Assessment: Improved with increase in meds.  HR OK.   Plan:Continue. CAD (06/16/2009)   Assessment: Patient r/o for MI.  No Chest pains.  Presentation probably due to diastolic CHF, not provoked by ischemia.   Plan: Will ambulate Atrial fibrillation (09/05/2009)   Assessment:  Remains in SR.  Will resume coumadin tonight.  D/C heparin.  Give extra coumadin.   Plan:  Acute on chronic diastolic heart failure (05/20/2011)   Assessment: No diuresis.  Will increase Lasix.  Follow I/O.  Would like net neg.  Check BNP in AM.     Plan: Chest pain, unspecified (05/20/2011)   Assessment: Resolved.   Plan:   LOS: 1 day   Dietrich Pates 05/21/2011, 4:53 PM

## 2011-05-21 NOTE — Progress Notes (Signed)
UR Completed.  Vernon Ariel Jane [phone] 05/21/2011  

## 2011-05-21 NOTE — Progress Notes (Signed)
ANTICOAGULATION CONSULT NOTE - Follow Up Consult  Pharmacy Consult for warfari Indication: atrial fibrillation  Allergies  Allergen Reactions  . Ace Inhibitors Cough    Enalapril  benicor    . Crestor (Rosuvastatin Calcium)     Fatigue and leg pain   . Lipitor (Atorvastatin Calcium)     Myalgias   . Niaspan (Niacin)     Intol   . Prednisone     REACTION: "retained fluid"    Patient Measurements: Height: 5\' 2"  (157.5 cm) Weight: 159 lb 9.8 oz (72.4 kg) IBW/kg (Calculated) : 50.1   Vital Signs: Temp: 98.3 F (36.8 C) (11/23 1545) Temp src: Oral (11/23 1545) BP: 140/49 mmHg (11/23 1545)  Labs:  Basename 05/21/11 1536 05/21/11 0630 05/20/11 1728 05/20/11 1727 05/20/11 1202 05/20/11 1201 05/20/11 0351 05/20/11 0333  HGB -- -- -- -- -- -- 13.9 12.2  HCT -- -- -- -- -- -- 41.0 38.1  PLT -- -- -- -- -- -- -- 200  APTT -- -- -- -- -- -- -- 42*  LABPROT -- 21.1* -- 24.6* -- -- -- 25.0*  INR -- 1.79* -- 2.18* -- -- -- 2.22*  HEPARINUNFRC <0.10* -- -- -- -- -- -- --  CREATININE -- 0.57 -- -- -- 0.47* 0.50 --  CKTOTAL -- -- 55 -- 41 -- -- --  CKMB -- -- 4.0 -- 3.8 -- -- --  TROPONINI -- -- <0.30 -- <0.30 -- -- --   Estimated Creatinine Clearance: 53.1 ml/min (by C-G formula based on Cr of 0.57).   Medications:  Scheduled:    . aspirin EC  81 mg Oral Daily  . cholecalciferol  1,000 Units Oral Daily  . colesevelam  1,250 mg Oral Daily  . digoxin  0.125 mg Oral Daily  . diltiazem  420 mg Oral Daily  . furosemide  40 mg Intravenous BID  . glimepiride  4 mg Oral Q breakfast  . insulin aspart  0-20 Units Subcutaneous TID WC  . insulin aspart protamine-insulin aspart  15-30 Units Subcutaneous BID WC  . latanoprost  1 drop Both Eyes QHS  . metFORMIN  1,000 mg Oral BID WC  . metoprolol tartrate  50 mg Oral BID  . potassium chloride  20 mEq Oral BID  . sodium chloride  3 mL Intravenous Q12H    Assessment: 77 yo F to d/c heparin and resume warfarin in preparation for  hopeful d/c tomorrow.  INR this am slightly less than goal.  Goal of Therapy:  INR 2-3   Plan:  1.  Warfarin 10 mg po x 1 tonight.  2.  AM INR. 3. Initiate education with book/video.  Capri Raben L. Illene Bolus, PharmD, BCPS Clinical Pharmacist Pager: [phone] 05/21/2011 5:05 PM

## 2011-05-22 LAB — CBC
HCT: 40.5 % (ref 36.0–46.0)
Hemoglobin: 13.9 g/dL (ref 12.0–15.0)
MCH: 28.8 pg (ref 26.0–34.0)
MCHC: 34.3 g/dL (ref 30.0–36.0)
MCV: 84 fL (ref 78.0–100.0)
Platelets: 205 K/uL (ref 150–400)
RBC: 4.82 MIL/uL (ref 3.87–5.11)
RDW: 13.5 % (ref 11.5–15.5)
WBC: 9.8 K/uL (ref 4.0–10.5)

## 2011-05-22 LAB — PROTIME-INR: INR: 1.45 (ref 0.00–1.49)

## 2011-05-22 LAB — BASIC METABOLIC PANEL
BUN: 21 mg/dL (ref 6–23)
CO2: 27 mEq/L (ref 19–32)
Calcium: 10.7 mg/dL — ABNORMAL HIGH (ref 8.4–10.5)
Creatinine, Ser: 0.57 mg/dL (ref 0.50–1.10)
GFR calc non Af Amer: 86 mL/min — ABNORMAL LOW (ref >90)
Glucose, Bld: 250 mg/dL — ABNORMAL HIGH (ref 70–99)
Sodium: 135 mEq/L (ref 135–145)

## 2011-05-22 LAB — GLUCOSE, CAPILLARY
Glucose-Capillary: 117 mg/dL — ABNORMAL HIGH (ref 70–99)
Glucose-Capillary: 178 mg/dL — ABNORMAL HIGH (ref 70–99)

## 2011-05-22 MED ORDER — METFORMIN HCL 500 MG PO TABS
1000.0000 mg | ORAL_TABLET | Freq: Two times a day (BID) | ORAL | Status: DC
  Filled 2011-05-22 (×2): qty 2

## 2011-05-22 MED ORDER — GLIMEPIRIDE 4 MG PO TABS
4.0000 mg | ORAL_TABLET | Freq: Every day | ORAL | Status: DC
  Filled 2011-05-22: qty 1

## 2011-05-22 MED ORDER — FUROSEMIDE 80 MG PO TABS
80.0000 mg | ORAL_TABLET | Freq: Two times a day (BID) | ORAL | Status: DC
  Administered 2011-05-22 – 2011-05-23 (×3): 80 mg via ORAL
  Filled 2011-05-22 (×5): qty 1

## 2011-05-22 MED ORDER — WARFARIN SODIUM 10 MG PO TABS
10.0000 mg | ORAL_TABLET | Freq: Once | ORAL | Status: AC
  Administered 2011-05-22: 10 mg via ORAL
  Filled 2011-05-22: qty 1

## 2011-05-22 NOTE — Progress Notes (Signed)
Nutrition Consult for diet education:  Chart reviewed and patient's status noted.  Education completed with patient and her brother on carbohydrate counting for people with diabetes, meal planning, label reading, and heart healthy eating.  Patient reports that she lives alone and eats small amounts for breakfast (usually at 10:00 AM), lunch (usually at 3:00 PM), and dinner.  She checks her blood sugars in the morning but admits to forgetting to check 2 hr post-prandial levels.  She reports occasional nighttime hypoglycemia which awakens her from sleep.    Tolerating Carb Mod Medium, 2 g sodium diet with 1800 ml fluid restriction.  Reports fair appetite.  Ht 62 inches, Wt 159 lb, BMI 29.4 consistent with overweight.  BMET    Component Value Date/Time   NA 135 05/22/2011 0615   K 4.1 05/22/2011 0615   CL 97 05/22/2011 0615   CO2 27 05/22/2011 0615   GLUCOSE 250* 05/22/2011 0615   BUN 21 05/22/2011 0615   CREATININE 0.57 05/22/2011 0615   CALCIUM 10.7* 05/22/2011 0615   GFRNONAA 86* 05/22/2011 0615   GFRAA >90 05/22/2011 0615   CBG (last 3)   Basename 05/22/11 1146 05/22/11 0754 05/21/11 2132  GLUCAP 180* 249* 157*   A1C 8.5  Meds:    . cholecalciferol  1,000 Units Oral Daily  . colesevelam  1,250 mg Oral Daily  . coumadin book   Does not apply Once  . digoxin  0.125 mg Oral Daily  . diltiazem  420 mg Oral Daily  . furosemide  80 mg Oral BID  . glimepiride  4 mg Oral Q breakfast  . glimepiride  4 mg Oral Q breakfast  . insulin aspart  0-20 Units Subcutaneous TID WC  . insulin aspart protamine-insulin aspart  15-30 Units Subcutaneous BID WC  . latanoprost  1 drop Both Eyes QHS  . metFORMIN  1,000 mg Oral BID WC  . metFORMIN  1,000 mg Oral BID WC  . metoprolol tartrate  50 mg Oral BID  . potassium chloride  20 mEq Oral BID  . sodium chloride  3 mL Intravenous Q12H  . warfarin  10 mg Oral ONCE-1800  . warfarin  10 mg Oral ONCE-1800  . warfarin   Does not apply Once  .  DISCONTD: aspirin EC  81 mg Oral Daily  . DISCONTD: furosemide  40 mg Intravenous BID  . DISCONTD: furosemide  80 mg Intravenous BID   Nutrition diagnosis:  Food and nutrition-related knowledge deficit r/t limited previous education AEB pt report  Goal:  Patient to verbalize >/= 2 strategies related to diet to improve glycemic control after d/c; met  Plan:  Recommend outpatient diabetes ed consult.  Patient also provided information on free nutrition counseling resources available through [facility] for [facility]. Residents.

## 2011-05-22 NOTE — Progress Notes (Signed)
Subjective: Brief CP yesterday; dyspnea better   Physical Exam:   Blood pressure 154/51, pulse 72, temperature 97.6 F (36.4 C), temperature source Oral, resp. rate 16, height 5\' 2"  (1.575 m), weight 159 lb 13.3 oz (72.5 kg), SpO2 92.00%. Temp:  [97.6 F (36.4 C)-98.7 F (37.1 C)] 97.6 F (36.4 C) (11/24 0746) Pulse Rate:  [66-81] 72  (11/24 0746) Resp:  [2-22] 16  (11/24 0746) BP: (140-171)/(46-103) 154/51 mmHg (11/24 0746) SpO2:  [90 %-95 %] 92 % (11/24 0746) Weight:  [159 lb 13.3 oz (72.5 kg)] 159 lb 13.3 oz (72.5 kg) (11/24 0400) Weight change: -10.6 oz (-0.3 kg) I/O 11/23 0701 - 11/24 0700 In: 1020 [P.O.:920; I.V.:100] Out: 1601 [Urine:1601]  HEENT-normal/normal eyelids Neck supple chest - CTA/ normal expansion CV - RRR/2/6 systolic murmur LSB Abdomen -NT/ND Ext-no edema, chords, 2+ DP Neuro-grossly nonfocal  Results for orders placed during the hospital encounter of 05/20/11 (from the past 48 hour(s))  MRSA PCR SCREENING     Status: Normal   Collection Time   05/20/11 10:40 AM      Component Value Range Comment   MRSA by PCR NEGATIVE  NEGATIVE    GLUCOSE, CAPILLARY     Status: Abnormal   Collection Time   05/20/11 11:58 AM      Component Value Range Comment   Glucose-Capillary 267 (*) 70 - 99 (mg/dL)   TSH     Status: Normal   Collection Time   05/20/11 12:01 PM      Component Value Range Comment   TSH 1.427  0.350 - 4.500 (uIU/mL)   COMPREHENSIVE METABOLIC PANEL     Status: Abnormal   Collection Time   05/20/11 12:01 PM      Component Value Range Comment   Sodium 136  135 - 145 (mEq/L)    Potassium 3.8  3.5 - 5.1 (mEq/L)    Chloride 96  96 - 112 (mEq/L)    CO2 27  19 - 32 (mEq/L)    Glucose, Bld 264 (*) 70 - 99 (mg/dL)    BUN 10  6 - 23 (mg/dL)    Creatinine, Ser 4.01 (*) 0.50 - 1.10 (mg/dL)    Calcium 9.5  8.4 - 10.5 (mg/dL)    Total Protein 7.2  6.0 - 8.3 (g/dL)    Albumin 3.2 (*) 3.5 - 5.2 (g/dL)    AST 12  0 - 37 (U/L)    ALT 10  0 - 35 (U/L)      Alkaline Phosphatase 81  39 - 117 (U/L)    Total Bilirubin 0.7  0.3 - 1.2 (mg/dL)    GFR calc non Af Amer >90  >90 (mL/min)    GFR calc Af Amer >90  >90 (mL/min)   HEMOGLOBIN A1C     Status: Abnormal   Collection Time   05/20/11 12:01 PM      Component Value Range Comment   Hemoglobin A1C 8.5 (*) <5.7 (%)    Mean Plasma Glucose 197 (*) <117 (mg/dL)   CARDIAC PANEL(CRET KIN+CKTOT+MB+TROPI)     Status: Normal   Collection Time   05/20/11 12:02 PM      Component Value Range Comment   Total CK 41  7 - 177 (U/L)    CK, MB 3.8  0.3 - 4.0 (ng/mL)    Troponin I <0.30  <0.30 (ng/mL)    Relative Index RELATIVE INDEX IS INVALID  0.0 - 2.5    GLUCOSE, CAPILLARY     Status: Abnormal  Collection Time   05/20/11  4:42 PM      Component Value Range Comment   Glucose-Capillary 264 (*) 70 - 99 (mg/dL)   PROTIME-INR     Status: Abnormal   Collection Time   05/20/11  5:27 PM      Component Value Range Comment   Prothrombin Time 24.6 (*) 11.6 - 15.2 (seconds)    INR 2.18 (*) 0.00 - 1.49    CARDIAC PANEL(CRET KIN+CKTOT+MB+TROPI)     Status: Normal   Collection Time   05/20/11  5:28 PM      Component Value Range Comment   Total CK 55  7 - 177 (U/L)    CK, MB 4.0  0.3 - 4.0 (ng/mL)    Troponin I <0.30  <0.30 (ng/mL)    Relative Index RELATIVE INDEX IS INVALID  0.0 - 2.5    GLUCOSE, CAPILLARY     Status: Abnormal   Collection Time   05/20/11  9:36 PM      Component Value Range Comment   Glucose-Capillary 123 (*) 70 - 99 (mg/dL)    Comment 1 Notify RN      Comment 2 Documented in Chart     PROTIME-INR     Status: Abnormal   Collection Time   05/21/11  6:30 AM      Component Value Range Comment   Prothrombin Time 21.1 (*) 11.6 - 15.2 (seconds)    INR 1.79 (*) 0.00 - 1.49    BASIC METABOLIC PANEL     Status: Abnormal   Collection Time   05/21/11  6:30 AM      Component Value Range Comment   Sodium 139  135 - 145 (mEq/L)    Potassium 4.2  3.5 - 5.1 (mEq/L)    Chloride 100  96 - 112  (mEq/L)    CO2 28  19 - 32 (mEq/L)    Glucose, Bld 309 (*) 70 - 99 (mg/dL)    BUN 16  6 - 23 (mg/dL)    Creatinine, Ser 0.98  0.50 - 1.10 (mg/dL)    Calcium 11.9  8.4 - 10.5 (mg/dL)    GFR calc non Af Amer 86 (*) >90 (mL/min)    GFR calc Af Amer >90  >90 (mL/min)   LIPID PANEL     Status: Abnormal   Collection Time   05/21/11  6:30 AM      Component Value Range Comment   Cholesterol 185  0 - 200 (mg/dL)    Triglycerides 147  <150 (mg/dL)    HDL 47  >82 (mg/dL)    Total CHOL/HDL Ratio 3.9      VLDL 27  0 - 40 (mg/dL)    LDL Cholesterol 956 (*) 0 - 99 (mg/dL)   GLUCOSE, CAPILLARY     Status: Abnormal   Collection Time   05/21/11  8:20 AM      Component Value Range Comment   Glucose-Capillary 301 (*) 70 - 99 (mg/dL)   GLUCOSE, CAPILLARY     Status: Abnormal   Collection Time   05/21/11 12:05 PM      Component Value Range Comment   Glucose-Capillary 196 (*) 70 - 99 (mg/dL)   HEPARIN LEVEL (UNFRACTIONATED)     Status: Abnormal   Collection Time   05/21/11  3:36 PM      Component Value Range Comment   Heparin Unfractionated <0.10 (*) 0.30 - 0.70 (IU/mL)   GLUCOSE, CAPILLARY     Status: Abnormal   Collection  Time   05/21/11  5:20 PM      Component Value Range Comment   Glucose-Capillary 196 (*) 70 - 99 (mg/dL)   GLUCOSE, CAPILLARY     Status: Abnormal   Collection Time   05/21/11  9:32 PM      Component Value Range Comment   Glucose-Capillary 157 (*) 70 - 99 (mg/dL)   PRO B NATRIURETIC PEPTIDE     Status: Abnormal   Collection Time   05/22/11  5:00 AM      Component Value Range Comment   BNP, POC 542.3 (*) 0 - 450 (pg/mL)   PROTIME-INR     Status: Abnormal   Collection Time   05/22/11  6:15 AM      Component Value Range Comment   Prothrombin Time 17.9 (*) 11.6 - 15.2 (seconds)    INR 1.45  0.00 - 1.49    CBC     Status: Normal   Collection Time   05/22/11  6:15 AM      Component Value Range Comment   WBC 9.8  4.0 - 10.5 (K/uL)    RBC 4.82  3.87 - 5.11 (MIL/uL)     Hemoglobin 13.9  12.0 - 15.0 (g/dL)    HCT 16.1  09.6 - 04.5 (%)    MCV 84.0  78.0 - 100.0 (fL)    MCH 28.8  26.0 - 34.0 (pg)    MCHC 34.3  30.0 - 36.0 (g/dL)    RDW 40.9  81.1 - 91.4 (%)    Platelets 205  150 - 400 (K/uL)   BASIC METABOLIC PANEL     Status: Abnormal   Collection Time   05/22/11  6:15 AM      Component Value Range Comment   Sodium 135  135 - 145 (mEq/L)    Potassium 4.1  3.5 - 5.1 (mEq/L)    Chloride 97  96 - 112 (mEq/L)    CO2 27  19 - 32 (mEq/L)    Glucose, Bld 250 (*) 70 - 99 (mg/dL)    BUN 21  6 - 23 (mg/dL)    Creatinine, Ser 7.82  0.50 - 1.10 (mg/dL)    Calcium 95.6 (*) 8.4 - 10.5 (mg/dL)    GFR calc non Af Amer 86 (*) >90 (mL/min)    GFR calc Af Amer >90  >90 (mL/min)   GLUCOSE, CAPILLARY     Status: Abnormal   Collection Time   05/22/11  7:54 AM      Component Value Range Comment   Glucose-Capillary 249 (*) 70 - 99 (mg/dL)     No results found.  Assessment/Plan Patient Active Hospital Problem List: HYPERTENSION (07/28/2007) BP controlled; continue present meds. CAD (06/16/2009) Given use of coumadin, dc ASA. Atrial fibrillation (09/05/2009) Patient in sinus; continue cardizem and coumadin Acute on chronic diastolic heart failure (05/20/2011) Dyspnea improved; change lasix to 80 mg po BID; follow renal function. Chest pain, unspecified (05/20/2011) Recurrent chest pain yesterday; ? Ischemia; enzymes negative; plan myoview in AM.   Olga Millers MD 05/22/2011, 8:04 AM

## 2011-05-22 NOTE — Progress Notes (Signed)
ANTICOAGULATION CONSULT NOTE - Follow Up Consult  Pharmacy Consult for Coumadin Indication: atrial fibrillation  Allergies  Allergen Reactions  . Ace Inhibitors Cough    Enalapril  benicor    . Crestor (Rosuvastatin Calcium)     Fatigue and leg pain   . Lipitor (Atorvastatin Calcium)     Myalgias   . Niaspan (Niacin)     Intol   . Prednisone     REACTION: "retained fluid"    Patient Measurements: Height: 5\' 2"  (157.5 cm) Weight: 159 lb 13.3 oz (72.5 kg) IBW/kg (Calculated) : 50.1    Vital Signs: Temp: 97.6 F (36.4 C) (11/24 0746) Temp src: Oral (11/24 0746) BP: 154/51 mmHg (11/24 0746) Pulse Rate: 72  (11/24 0746)  Labs:  Basename 05/22/11 0615 05/21/11 1536 05/21/11 0630 05/20/11 1728 05/20/11 1727 05/20/11 1202 05/20/11 1201 05/20/11 0351 05/20/11 0333  HGB 13.9 -- -- -- -- -- -- 13.9 --  HCT 40.5 -- -- -- -- -- -- 41.0 38.1  PLT 205 -- -- -- -- -- -- -- 200  APTT -- -- -- -- -- -- -- -- 42*  LABPROT 17.9* -- 21.1* -- 24.6* -- -- -- --  INR 1.45 -- 1.79* -- 2.18* -- -- -- --  HEPARINUNFRC -- <0.10* -- -- -- -- -- -- --  CREATININE 0.57 -- 0.57 -- -- -- 0.47* -- --  CKTOTAL -- -- -- 55 -- 41 -- -- --  CKMB -- -- -- 4.0 -- 3.8 -- -- --  TROPONINI -- -- -- <0.30 -- <0.30 -- -- --   Estimated Creatinine Clearance: 53.2 ml/min (by C-G formula based on Cr of 0.57).   Assessment:    Coumadin resumed last night with 10 mg.     INR has dropped to 1.45.      Coumadin regimen prior to admission was 10 mg on Mondays, 7.5 mg all other days.    Aspirin dc'd this am.  Goal of Therapy:  INR 2-3   Plan:     Will repeat 10 mg Coumadin today.    Continue daily PT/INR.  Dennie Fetters Pager: [phone] 05/22/2011,11:39 AM

## 2011-05-23 ENCOUNTER — Encounter (HOSPITAL_COMMUNITY): Payer: Self-pay | Admitting: Physician Assistant

## 2011-05-23 ENCOUNTER — Inpatient Hospital Stay (HOSPITAL_COMMUNITY): Payer: Medicare Other

## 2011-05-23 DIAGNOSIS — R079 Chest pain, unspecified: Secondary | ICD-10-CM

## 2011-05-23 LAB — BASIC METABOLIC PANEL
BUN: 21 mg/dL (ref 6–23)
Calcium: 10.6 mg/dL — ABNORMAL HIGH (ref 8.4–10.5)
GFR calc non Af Amer: 84 mL/min — ABNORMAL LOW (ref >90)
Glucose, Bld: 207 mg/dL — ABNORMAL HIGH (ref 70–99)

## 2011-05-23 LAB — PROTIME-INR
INR: 1.7 — ABNORMAL HIGH (ref 0.00–1.49)
Prothrombin Time: 20.3 seconds — ABNORMAL HIGH (ref 11.6–15.2)

## 2011-05-23 LAB — PRO B NATRIURETIC PEPTIDE: Pro B Natriuretic peptide (BNP): 314.1 pg/mL (ref 0–450)

## 2011-05-23 LAB — CBC
MCH: 28.5 pg (ref 26.0–34.0)
Platelets: 220 10*3/uL (ref 150–400)
RBC: 4.6 MIL/uL (ref 3.87–5.11)

## 2011-05-23 LAB — GLUCOSE, CAPILLARY: Glucose-Capillary: 344 mg/dL — ABNORMAL HIGH (ref 70–99)

## 2011-05-23 MED ORDER — METOPROLOL TARTRATE 50 MG PO TABS: 50.0000 mg | ORAL_TABLET | Freq: Two times a day (BID) | ORAL | Status: DC

## 2011-05-23 MED ORDER — TECHNETIUM TC 99M TETROFOSMIN IV KIT
10.1000 | PACK | Freq: Once | INTRAVENOUS | Status: AC | PRN
  Administered 2011-05-23: 10.1 via INTRAVENOUS

## 2011-05-23 MED ORDER — DILTIAZEM HCL ER BEADS 420 MG PO CP24: 420.0000 mg | ORAL_CAPSULE | Freq: Every day | ORAL | Status: DC

## 2011-05-23 MED ORDER — TECHNETIUM TC 99M TETROFOSMIN IV KIT
30.0000 | PACK | Freq: Once | INTRAVENOUS | Status: AC | PRN
  Administered 2011-05-23: 30 via INTRAVENOUS

## 2011-05-23 MED ORDER — METFORMIN HCL 1000 MG PO TABS: 1000.0000 mg | ORAL_TABLET | Freq: Two times a day (BID) | ORAL | Status: DC

## 2011-05-23 NOTE — Progress Notes (Signed)
Alexandria [name] is a 77 y.o. female presented to nuclear medicine for [employer] With Lexiscan injection: BP 188/77 ==> 172/88 HR 85 ==> 83 ECG with NSR, frequent PACs, no significant ST changes Patient tolerated well. She complained of tightness "all over." Symptoms resolved after 8 minutes.  Images Pending.  Tereso Newcomer, PA-C  9:32 AM 05/23/2011

## 2011-05-23 NOTE — Discharge Summary (Signed)
Discharge Summary Patient ID: Alexandria [name] MRN: [id number] DOB/AGE: 1929-10-01 77 y.o.   Admit date: 05/20/2011 Discharge date: 05/23/2011   PRIMARY CARE PHYSICIAN:    Dr. Christell Constant  PRIMARY CARDIOLOGIST:   Dr. [name]   Consults: none  Reason for Admission:   Chest pain and shortness of breath  Discharge Diagnoses:      1.  Acute on chronic diastolic heart failure  A.  compensated prior to discharge  2.  Chest pain, unspecified  A.  MI ruled out; Myoview negative for ischemia   Secondary Diagnoses    Past Medical History  Diagnosis Date  . Hypertension   . Glaucoma   . Tremor, essential   . Diabetes mellitus with neuropathy   . B12 deficiency   . Hyperlipidemia   . DM (diabetes mellitus)   . Atrial fibrillation   . Chronic diastolic heart failure     echo 05/2009: mild LVH, EF 55-60%, grade 2 diast dysfxn, mild LAE, PASP  . CAD (coronary artery disease)     LHC 06/2009: pLAD 40%, prox-mid D1 80% (small);  CFX 40-50%, mRCA 20%, EF 65%  -  med Rx  . Carotid stenosis     dopplers 6/12: 1-39% bilat ICA  . Pulmonary fibrosis     Dr. Vassie Loll;  patient taken off O2 in 8/12; dyspnea felt CHF>>ILD  . Skin cancer   . Shortness of breath      Procedures Performed This Admission:  None  Admission History:   Alexandria [name] is a 77 y.o. female with a h/o CAD, diastolic CHF, atrial fibrillation, interstitial lung disease, HTN, HLP, DM2. She was in her usual state of health until this past month. She noted some chest pressure with exertion that would resolve with rest.  On the morning of admission, she awoke around 12am to use the bathroom. She noted severe chest pressure across her anterior chest with associated arm pain bilaterally and dyspnea. No associated nausea, diaphoresis, syncope. She called EMS. She was given some NTG and ASA and is not sure it helped. BP was 228/94 upon presentation. She was placed on NTG gtt and given Lasix IV 40 mg. BNP was elevated and her CXR  demonstrated probable edema. She was admitted for further evaluation and treatment.  Hospital Course:   Myocardial infarction was ruled out. She was diuresed with IV Lasix with improvement in her symptoms. She had recurrent chest discomfort and it was decided to pursue inpatient stress testing. A Lexiscan Myoview was undertaken today. This demonstrated no ischemia. She was evaluated by Dr. Jens Som this morning. He felt that she could be discharged home in stable condition if her Myoview scan was low risk and if her renal function remained stable. Followup basic metabolic panel is pending at this time. As long as this is stable, she will be discharged home.   Of note, her Coumadin was held for 2 days in anticipation of possible cardiac catheterization. This was resumed 2 days ago and her most recent INR is noted below. She will need followup with her primary care physician next week to recheck her INR. She will resume her usual home dose of Coumadin.   Metoprolol was adjusted this admission. Her diltiazem was unchanged. She was discharged home on her usual Lasix dose.  She will need a BMET next week with her PCP.  It is recommended that she take extra Lasix should she note weight gain or increased dyspnea. She already has followup with Dr. Antoine Poche in 2 weeks  and will keep that appointment.  Of note, her A1C was over 8.  She was seen by the dietician.  She will need close follow up with her PCP for diabetic management.    Discharge weight:  159 lbs.    Labs: Lab Results  Component Value Date   WBC 8.7 05/23/2011   HGB 13.1 05/23/2011   HCT 38.5 05/23/2011   MCV 83.7 05/23/2011   PLT 220 05/23/2011      Lab 05/22/11 0615 05/20/11 1201  NA 135 --  K 4.1 --  CL 97 --  CO2 27 --  BUN 21 --  CREATININE 0.57 --  CALCIUM 10.7* --  PROT -- 7.2  BILITOT -- 0.7  ALKPHOS -- 81  ALT -- 10  AST -- 12  GLUCOSE 250* --     Basename 05/20/11 1728  CKTOTAL 55  CKMB 4.0  TROPONINI <0.30     Lab Results  Component Value Date   CHOL 185 05/21/2011   HDL 47 05/21/2011   LDLCALC 111* 05/21/2011   TRIG 133 05/21/2011    No results found for this basename: DDIMER    Lab Results  Component Value Date   TSH 1.427 05/20/2011      Lab Results  Component Value Date   HGBA1C 8.5* 05/20/2011     Lab Results  Component Value Date   INR 1.70* 05/23/2011   INR 1.45 05/22/2011   INR 1.79* 05/21/2011     Radiology:  Dg Chest 2 View  05/20/2011  IMPRESSION: Cardiomegaly with central vascular congestion.  Chronic interstitial prominence.  Cannot exclude interstitial edema in this setting.  Mild bibasilar opacities are nonspecific though favored to represent scarring or atelectasis.  Original Report Authenticated By: Waneta Martins, M.D.   Lexiscan Myoview  Nm Myocar Multi W/spect W/wall Motion / Ef  05/23/2011   IMPRESSION:  1.  No fixed or reversible defects to suggest inducible ischemia. 2.  Normal left ventricular wall motion and thickening. 3.  Normal ejection fraction (EF 53%).  Original Report Authenticated By: Consuello Bossier, M.D.     DISCHARGE MEDICATIONS: Current Discharge Medication List    START taking these medications   Details  diltiazem (TIAZAC) 420 MG 24 hr capsule Take 1 capsule (420 mg total) by mouth daily. Qty: 30 capsule, Refills: 5    metFORMIN (GLUCOPHAGE) 1000 MG tablet Take 1 tablet (1,000 mg total) by mouth 2 (two) times daily with a meal.    metoprolol (LOPRESSOR) 50 MG tablet Take 1 tablet (50 mg total) by mouth 2 (two) times daily. Qty: 60 tablet, Refills: 5      CONTINUE these medications which have NOT CHANGED   Details  Besifloxacin HCl (BESIVANCE) 0.6 % SUSP Place 1 drop into both eyes 4 (four) times daily as needed. For 2 days following eye injections     Cholecalciferol (VITAMIN D) 1000 UNITS capsule Take 1,000 Units by mouth daily. OTC      colesevelam (WELCHOL) 625 MG tablet Take 1,250 mg by mouth daily.      digoxin  (LANOXIN) 0.125 MG tablet TAKE ONE TABLET BY MOUTH ONE TIME DAILY Qty: 30 tablet, Refills: 12    furosemide (LASIX) 40 MG tablet TAKE 2 TABLETS BY MOUTH TWICE DAILY Qty: 120 tablet, Refills: 5    glimepiride (AMARYL) 4 MG tablet Take 1 tablet by mouth Twice daily.    KLOR-CON M10 10 MEQ tablet TAKE ONE TABLET BY MOUTH ONE TIME DAILY Qty: 30 each, Refills: 3  latanoprost (XALATAN) 0.005 % ophthalmic solution 1 drop at bedtime.      NOVOLOG MIX 70/30 FLEXPEN (70-30) 100 UNIT/ML injection Inject 15-30 Units into the skin Twice daily. 30 in the morning  15 at night    warfarin (COUMADIN) 5 MG tablet Take 5 mg by mouth daily. 2 on Monday and 1 1/2 all other days    nitroGLYCERIN (NITROSTAT) 0.4 MG SL tablet Place 0.4 mg under the tongue every 5 (five) minutes as needed. As needed for chest pain      STOP taking these medications     aspirin (LONGS ADULT LOW STRENGTH ASA) 81 MG EC tablet      aspirin 81 MG chewable tablet         DISCHARGE INSTRUCTIONS: Discharge Orders    Future Appointments: Provider: Department: Dept Phone: Center:   05/24/2011 1:00 PM Sherrie George, MD [facility] [phone] None   06/09/2011 11:15 AM [name], MD [facility] [phone] [facility]   07/01/2011 8:45 AM Sherrie George, MD [facility] [phone] None     Future Orders Please Complete By Expires   Diet - low sodium heart healthy      Diet Carb Modified      Increase activity slowly      No wound care      (HEART FAILURE PATIENTS) Call MD:  Anytime you have any of the following symptoms: 1) 3 pound weight gain in 24 hours or 5 pounds in 1 week 2) shortness of breath, with or without a dry hacking cough 3) swelling in the hands, feet or stomach 4) if you have to sleep on extra pillows at night in order to breathe.      Comments:   If you have a weight gain of 3 or more pounds in one day, take EXTRA Lasix (furosemide) 40 mg that day and call our office  [phone]).      FOLLOW UP: Follow-up Information    Follow up with [facility] on 06/09/2011. (Dr. [name] at 11:15 am)    Contact information:   [address] [place] [phone]       Follow up with Monica Becton, MD in 1 week. (please call for appointment this week to have your coumadin checked)    Contact information:   [address] [place] [address] [phone]       Follow up with Monica Becton, MD in 1 week. (Labs this week to be done at Dr. Kathi Der office:  BMET  (diagnosis 428.32))    Contact information:   [address] [place] [address] [phone]           Total Physician and PA time for this discharge: Greater than 30 minutes.  Signed: Tereso Newcomer, PA-C  1:56 PM 05/23/2011

## 2011-05-23 NOTE — Progress Notes (Signed)
Subjective: No further chest pain; no dyspnea   Physical Exam:   Blood pressure 153/84, pulse 77, temperature 98.3 F (36.8 C), temperature source Oral, resp. rate 16, height 5\' 2"  (1.575 m), weight 159 lb 13.3 oz (72.5 kg), SpO2 96.00%. Temp:  [97.5 F (36.4 C)-98.7 F (37.1 C)] 98.3 F (36.8 C) (11/25 0409) Pulse Rate:  [63-77] 77  (11/24 1943) Resp:  [16-20] 16  (11/24 2309) BP: (117-156)/(51-84) 153/84 mmHg (11/25 0409) SpO2:  [92 %-96 %] 96 % (11/25 0409) Weight change:  I/O 11/24 0701 - 11/25 0700 In: 1000 [P.O.:1000] Out: 1550 [Urine:1550]  HEENT-normal/normal eyelids Neck supple chest - CTA/ normal expansion CV - RRR/2/6 systolic murmur LSB Abdomen -NT/ND Ext-no edema, chords, 2+ DP Neuro-grossly nonfocal  Results for orders placed during the hospital encounter of 05/20/11 (from the past 48 hour(s))  PROTIME-INR     Status: Abnormal   Collection Time   05/21/11  6:30 AM      Component Value Range Comment   Prothrombin Time 21.1 (*) 11.6 - 15.2 (seconds)    INR 1.79 (*) 0.00 - 1.49    BASIC METABOLIC PANEL     Status: Abnormal   Collection Time   05/21/11  6:30 AM      Component Value Range Comment   Sodium 139  135 - 145 (mEq/L)    Potassium 4.2  3.5 - 5.1 (mEq/L)    Chloride 100  96 - 112 (mEq/L)    CO2 28  19 - 32 (mEq/L)    Glucose, Bld 309 (*) 70 - 99 (mg/dL)    BUN 16  6 - 23 (mg/dL)    Creatinine, Ser 1.61  0.50 - 1.10 (mg/dL)    Calcium 09.6  8.4 - 10.5 (mg/dL)    GFR calc non Af Amer 86 (*) >90 (mL/min)    GFR calc Af Amer >90  >90 (mL/min)   LIPID PANEL     Status: Abnormal   Collection Time   05/21/11  6:30 AM      Component Value Range Comment   Cholesterol 185  0 - 200 (mg/dL)    Triglycerides 045  <150 (mg/dL)    HDL 47  >40 (mg/dL)    Total CHOL/HDL Ratio 3.9      VLDL 27  0 - 40 (mg/dL)    LDL Cholesterol 981 (*) 0 - 99 (mg/dL)   GLUCOSE, CAPILLARY     Status: Abnormal   Collection Time   05/21/11  8:20 AM      Component Value  Range Comment   Glucose-Capillary 301 (*) 70 - 99 (mg/dL)   GLUCOSE, CAPILLARY     Status: Abnormal   Collection Time   05/21/11 12:05 PM      Component Value Range Comment   Glucose-Capillary 196 (*) 70 - 99 (mg/dL)   HEPARIN LEVEL (UNFRACTIONATED)     Status: Abnormal   Collection Time   05/21/11  3:36 PM      Component Value Range Comment   Heparin Unfractionated <0.10 (*) 0.30 - 0.70 (IU/mL)   GLUCOSE, CAPILLARY     Status: Abnormal   Collection Time   05/21/11  5:20 PM      Component Value Range Comment   Glucose-Capillary 196 (*) 70 - 99 (mg/dL)   GLUCOSE, CAPILLARY     Status: Abnormal   Collection Time   05/21/11  9:32 PM      Component Value Range Comment   Glucose-Capillary 157 (*) 70 - 99 (  mg/dL)   PRO B NATRIURETIC PEPTIDE     Status: Abnormal   Collection Time   05/22/11  5:00 AM      Component Value Range Comment   BNP, POC 542.3 (*) 0 - 450 (pg/mL)   PROTIME-INR     Status: Abnormal   Collection Time   05/22/11  6:15 AM      Component Value Range Comment   Prothrombin Time 17.9 (*) 11.6 - 15.2 (seconds)    INR 1.45  0.00 - 1.49    CBC     Status: Normal   Collection Time   05/22/11  6:15 AM      Component Value Range Comment   WBC 9.8  4.0 - 10.5 (K/uL)    RBC 4.82  3.87 - 5.11 (MIL/uL)    Hemoglobin 13.9  12.0 - 15.0 (g/dL)    HCT 16.1  09.6 - 04.5 (%)    MCV 84.0  78.0 - 100.0 (fL)    MCH 28.8  26.0 - 34.0 (pg)    MCHC 34.3  30.0 - 36.0 (g/dL)    RDW 40.9  81.1 - 91.4 (%)    Platelets 205  150 - 400 (K/uL)   BASIC METABOLIC PANEL     Status: Abnormal   Collection Time   05/22/11  6:15 AM      Component Value Range Comment   Sodium 135  135 - 145 (mEq/L)    Potassium 4.1  3.5 - 5.1 (mEq/L)    Chloride 97  96 - 112 (mEq/L)    CO2 27  19 - 32 (mEq/L)    Glucose, Bld 250 (*) 70 - 99 (mg/dL)    BUN 21  6 - 23 (mg/dL)    Creatinine, Ser 7.82  0.50 - 1.10 (mg/dL)    Calcium 95.6 (*) 8.4 - 10.5 (mg/dL)    GFR calc non Af Amer 86 (*) >90 (mL/min)     GFR calc Af Amer >90  >90 (mL/min)   GLUCOSE, CAPILLARY     Status: Abnormal   Collection Time   05/22/11  7:54 AM      Component Value Range Comment   Glucose-Capillary 249 (*) 70 - 99 (mg/dL)   GLUCOSE, CAPILLARY     Status: Abnormal   Collection Time   05/22/11 11:46 AM      Component Value Range Comment   Glucose-Capillary 180 (*) 70 - 99 (mg/dL)   GLUCOSE, CAPILLARY     Status: Abnormal   Collection Time   05/22/11  4:30 PM      Component Value Range Comment   Glucose-Capillary 117 (*) 70 - 99 (mg/dL)   GLUCOSE, CAPILLARY     Status: Abnormal   Collection Time   05/22/11 10:02 PM      Component Value Range Comment   Glucose-Capillary 178 (*) 70 - 99 (mg/dL)     No results found.  Assessment/Plan Patient Active Hospital Problem List: HYPERTENSION (07/28/2007) BP mildly elevated; continue present meds; follow as outpatient and increase as needed. CAD (06/16/2009) Continue present meds; no ASA as patient on coumadin; intolerant to statins. Atrial fibrillation (09/05/2009) Patient in sinus; continue cardizem, lopressor and coumadin Acute on chronic diastolic heart failure (05/20/2011) Dyspnea improved; continue lasix 80 mg po BID; BMET pending; DC on present dose if renal function stable; take an additional 40 mg po daily PRN dyspnea or weight gain of 2-3 lbs. Chest pain, unspecified (05/20/2011) No further chest pain; enzymes negative; plan myoview today; if  neg, DC today and fu Dr Antoine Poche.  >30 min PA and physician time D2 Olga Millers MD 05/23/2011, 6:18 AM

## 2011-05-24 ENCOUNTER — Encounter (INDEPENDENT_AMBULATORY_CARE_PROVIDER_SITE_OTHER): Payer: Medicare Other | Admitting: Ophthalmology

## 2011-05-24 DIAGNOSIS — H43819 Vitreous degeneration, unspecified eye: Secondary | ICD-10-CM

## 2011-05-24 DIAGNOSIS — E11311 Type 2 diabetes mellitus with unspecified diabetic retinopathy with macular edema: Secondary | ICD-10-CM

## 2011-05-24 DIAGNOSIS — E1165 Type 2 diabetes mellitus with hyperglycemia: Secondary | ICD-10-CM

## 2011-05-24 DIAGNOSIS — E11359 Type 2 diabetes mellitus with proliferative diabetic retinopathy without macular edema: Secondary | ICD-10-CM

## 2011-05-24 MED FILL — Regadenoson IV Inj 0.4 MG/5ML (0.08 MG/ML): INTRAVENOUS | Qty: 5 | Status: AC

## 2011-06-02 ENCOUNTER — Encounter: Payer: Self-pay | Admitting: Cardiology

## 2011-06-09 ENCOUNTER — Ambulatory Visit (INDEPENDENT_AMBULATORY_CARE_PROVIDER_SITE_OTHER): Payer: Medicare Other | Admitting: Cardiology

## 2011-06-09 ENCOUNTER — Encounter: Payer: Self-pay | Admitting: Cardiology

## 2011-06-09 DIAGNOSIS — E119 Type 2 diabetes mellitus without complications: Secondary | ICD-10-CM

## 2011-06-09 DIAGNOSIS — I10 Essential (primary) hypertension: Secondary | ICD-10-CM

## 2011-06-09 DIAGNOSIS — I509 Heart failure, unspecified: Secondary | ICD-10-CM

## 2011-06-09 DIAGNOSIS — I2510 Atherosclerotic heart disease of native coronary artery without angina pectoris: Secondary | ICD-10-CM

## 2011-06-09 DIAGNOSIS — I4891 Unspecified atrial fibrillation: Secondary | ICD-10-CM

## 2011-06-09 NOTE — Patient Instructions (Signed)
The current medical regimen is effective;  continue present plan and medications.  Follow up in 3 months with Dr Hochrein. 

## 2011-06-09 NOTE — Assessment & Plan Note (Signed)
She understands that her hemoglobin A1c is not at target and she will follow closely

## 2011-06-09 NOTE — Assessment & Plan Note (Signed)
Her blood pressure is controlled and she will continue the meds as listed 

## 2011-06-09 NOTE — Assessment & Plan Note (Signed)
The patient  tolerates this rhythm and rate control and anticoagulation. We will continue with the meds as listed.  

## 2011-06-09 NOTE — Progress Notes (Signed)
HPI  The patient presents for follow up after a recent hospitalization.  She was admitted with chest discomfort. It was felt to be somewhat atypical and she did rule out. She had a negative stress perfusion study. She did have some evidence of diastolic dysfunction. In addition she had a hypertensive urgency. Since discharge she has not had any of that   The patient denies any new symptoms such as chest discomfort, neck or arm discomfort. There has been no new shortness of breath, PND or orthopnea. There have been no reported palpitations, presyncope or syncope. I did review all of her hospital records. I also reviewed some recent blood work done since that discharge. Her renal function was normal. Her LDL is 89. Her HDL 26.7. Her hemoglobin A1c is 8.9%. Of note she is having her Coumadin followed as it held for a short period during the hospitalization.  Allergies  Allergen Reactions  . Ace Inhibitors Cough    Enalapril  benicor    . Crestor (Rosuvastatin Calcium)     Fatigue and leg pain   . Lipitor (Atorvastatin Calcium)     Myalgias   . Niaspan (Niacin)     Intol   . Olmesartan   . Prednisone     REACTION: "retained fluid"    Current Outpatient Prescriptions  Medication Sig Dispense Refill  . Besifloxacin HCl (BESIVANCE) 0.6 % SUSP Place 1 drop into both eyes 4 (four) times daily as needed. For 2 days following eye injections       . Cholecalciferol (VITAMIN D) 1000 UNITS capsule Take 1,000 Units by mouth daily. OTC        . colesevelam (WELCHOL) 625 MG tablet Take 1,250 mg by mouth daily.        . digoxin (LANOXIN) 0.125 MG tablet TAKE ONE TABLET BY MOUTH ONE TIME DAILY  30 tablet  12  . diltiazem (TIAZAC) 420 MG 24 hr capsule Take 1 capsule (420 mg total) by mouth daily.  30 capsule  5  . furosemide (LASIX) 40 MG tablet TAKE 2 TABLETS BY MOUTH TWICE DAILY  120 tablet  5  . glimepiride (AMARYL) 4 MG tablet Take 1 tablet by mouth Twice daily.      Marland Kitchen KLOR-CON M10 10 MEQ tablet  TAKE ONE TABLET BY MOUTH ONE TIME DAILY  30 each  3  . latanoprost (XALATAN) 0.005 % ophthalmic solution 1 drop at bedtime.        . metFORMIN (GLUCOPHAGE) 1000 MG tablet Take 1 tablet (1,000 mg total) by mouth 2 (two) times daily with a meal.      . metoprolol (LOPRESSOR) 50 MG tablet Take 1 tablet (50 mg total) by mouth 2 (two) times daily.  60 tablet  5  . nitroGLYCERIN (NITROSTAT) 0.4 MG SL tablet Place 0.4 mg under the tongue every 5 (five) minutes as needed. As needed for chest pain      . NOVOLOG MIX 70/30 FLEXPEN (70-30) 100 UNIT/ML injection Inject 15-30 Units into the skin Twice daily. 30 in the morning  15 at night      . warfarin (COUMADIN) 5 MG tablet Take 5 mg by mouth daily. 2 on Monday and 1 1/2 all other days        Past Medical History  Diagnosis Date  . Hypertension   . Glaucoma   . Tremor, essential   . Diabetes mellitus with neuropathy   . B12 deficiency   . Hyperlipidemia   . DM (diabetes mellitus)   .  Atrial fibrillation   . Chronic diastolic heart failure     echo 05/2009: mild LVH, EF 55-60%, grade 2 diast dysfxn, mild LAE, PASP  . CAD (coronary artery disease)     LHC 06/2009: pLAD 40%, prox-mid D1 80% (small);  CFX 40-50%, mRCA 20%, EF 65%  -  med Rx  . Carotid stenosis     dopplers 6/12: 1-39% bilat ICA  . Pulmonary fibrosis     Dr. Vassie Loll;  patient taken off O2 in 8/12; dyspnea felt CHF>>ILD  . Skin cancer   . Shortness of breath     Past Surgical History  Procedure Date  . Appendectomy   . Nose surgery   . Cardiac catheterization    ROS:  As stated in the HPI and negative for all other systems.  PHYSICAL EXAM BP 124/60  Pulse 93  Resp 18  Ht 5\' 3"  (1.6 m)  Wt 166 lb (75.297 kg)  BMI 29.41 kg/m2 GENERAL:  Well appearing HEENT:  Pupils equal round and reactive, fundi not visualized, oral mucosa unremarkable NECK:  No jugular venous distention, waveform within normal limits, carotid upstroke brisk and symmetric, right  bruits, no  thyromegaly LYMPHATICS:  No cervical, inguinal adenopathy LUNGS:  Bilateral fine crackles. BACK:  No CVA tenderness CHEST:  Unremarkable HEART:  PMI not displaced or sustained,S1 and S2 within normal limits, no S3, no S4, no clicks, no rubs, soft apical systolic murmurs ABD:  Flat, positive bowel sounds normal in frequency in pitch, no bruits, no rebound, no guarding, no midline pulsatile mass, no hepatomegaly, no splenomegaly EXT:  2 plus pulses throughout, no edema, no cyanosis no clubbing SKIN:  No rashes no nodules NEURO:  Cranial nerves II through XII grossly intact, motor grossly intact throughout, resting tremor PSYCH:  Cognitively intact, oriented to person place and time  ASSESSMENT AND PLAN

## 2011-06-09 NOTE — Assessment & Plan Note (Signed)
She has a preserved ejection fraction. She will continue with the current diuretics with salt and fluid restriction.

## 2011-06-09 NOTE — Assessment & Plan Note (Signed)
She had a negative stress perfusion study. Of note her aspirin was discontinued as she is on Coumadin. I agree with this. She will continue on meds as listed.

## 2011-06-24 ENCOUNTER — Encounter: Payer: Self-pay | Admitting: Cardiology

## 2011-06-29 HISTORY — PX: CARDIAC CATHETERIZATION: SHX172

## 2011-07-01 ENCOUNTER — Encounter (INDEPENDENT_AMBULATORY_CARE_PROVIDER_SITE_OTHER): Payer: Medicare Other | Admitting: Ophthalmology

## 2011-07-01 DIAGNOSIS — E11311 Type 2 diabetes mellitus with unspecified diabetic retinopathy with macular edema: Secondary | ICD-10-CM

## 2011-07-01 DIAGNOSIS — E11359 Type 2 diabetes mellitus with proliferative diabetic retinopathy without macular edema: Secondary | ICD-10-CM

## 2011-07-01 DIAGNOSIS — E1139 Type 2 diabetes mellitus with other diabetic ophthalmic complication: Secondary | ICD-10-CM

## 2011-07-01 DIAGNOSIS — H43819 Vitreous degeneration, unspecified eye: Secondary | ICD-10-CM

## 2011-07-07 ENCOUNTER — Encounter (INDEPENDENT_AMBULATORY_CARE_PROVIDER_SITE_OTHER): Payer: Medicare Other | Admitting: Ophthalmology

## 2011-07-07 DIAGNOSIS — H43819 Vitreous degeneration, unspecified eye: Secondary | ICD-10-CM

## 2011-07-07 DIAGNOSIS — E1139 Type 2 diabetes mellitus with other diabetic ophthalmic complication: Secondary | ICD-10-CM

## 2011-07-07 DIAGNOSIS — E11359 Type 2 diabetes mellitus with proliferative diabetic retinopathy without macular edema: Secondary | ICD-10-CM

## 2011-07-07 DIAGNOSIS — E11311 Type 2 diabetes mellitus with unspecified diabetic retinopathy with macular edema: Secondary | ICD-10-CM

## 2011-08-12 ENCOUNTER — Encounter (INDEPENDENT_AMBULATORY_CARE_PROVIDER_SITE_OTHER): Payer: Medicare Other | Admitting: Ophthalmology

## 2011-08-12 DIAGNOSIS — E1139 Type 2 diabetes mellitus with other diabetic ophthalmic complication: Secondary | ICD-10-CM

## 2011-08-12 DIAGNOSIS — E11311 Type 2 diabetes mellitus with unspecified diabetic retinopathy with macular edema: Secondary | ICD-10-CM

## 2011-08-13 ENCOUNTER — Emergency Department (HOSPITAL_COMMUNITY): Payer: Medicare Other

## 2011-08-13 ENCOUNTER — Encounter (HOSPITAL_COMMUNITY): Payer: Self-pay

## 2011-08-13 ENCOUNTER — Inpatient Hospital Stay (HOSPITAL_COMMUNITY)
Admission: EM | Admit: 2011-08-13 | Discharge: 2011-08-17 | DRG: 246 | Disposition: A | Discharge status: Home / Self Care | Payer: Medicare Other | Attending: Cardiology | Admitting: Cardiology

## 2011-08-13 ENCOUNTER — Other Ambulatory Visit: Payer: Self-pay

## 2011-08-13 DIAGNOSIS — I4891 Unspecified atrial fibrillation: Secondary | ICD-10-CM | POA: Diagnosis present

## 2011-08-13 DIAGNOSIS — Z79899 Other long term (current) drug therapy: Secondary | ICD-10-CM

## 2011-08-13 DIAGNOSIS — E119 Type 2 diabetes mellitus without complications: Secondary | ICD-10-CM | POA: Insufficient documentation

## 2011-08-13 DIAGNOSIS — E876 Hypokalemia: Secondary | ICD-10-CM | POA: Diagnosis not present

## 2011-08-13 DIAGNOSIS — Z7902 Long term (current) use of antithrombotics/antiplatelets: Secondary | ICD-10-CM

## 2011-08-13 DIAGNOSIS — Z7901 Long term (current) use of anticoagulants: Secondary | ICD-10-CM

## 2011-08-13 DIAGNOSIS — I5033 Acute on chronic diastolic (congestive) heart failure: Secondary | ICD-10-CM

## 2011-08-13 DIAGNOSIS — Z7982 Long term (current) use of aspirin: Secondary | ICD-10-CM

## 2011-08-13 DIAGNOSIS — I2510 Atherosclerotic heart disease of native coronary artery without angina pectoris: Secondary | ICD-10-CM | POA: Diagnosis present

## 2011-08-13 DIAGNOSIS — J8410 Pulmonary fibrosis, unspecified: Secondary | ICD-10-CM | POA: Insufficient documentation

## 2011-08-13 DIAGNOSIS — Z794 Long term (current) use of insulin: Secondary | ICD-10-CM

## 2011-08-13 DIAGNOSIS — Z800 Family history of malignant neoplasm of digestive organs: Secondary | ICD-10-CM

## 2011-08-13 DIAGNOSIS — Z888 Allergy status to other drugs, medicaments and biological substances status: Secondary | ICD-10-CM

## 2011-08-13 DIAGNOSIS — Z8042 Family history of malignant neoplasm of prostate: Secondary | ICD-10-CM

## 2011-08-13 DIAGNOSIS — I509 Heart failure, unspecified: Secondary | ICD-10-CM | POA: Diagnosis present

## 2011-08-13 DIAGNOSIS — I214 Non-ST elevation (NSTEMI) myocardial infarction: Principal | ICD-10-CM

## 2011-08-13 DIAGNOSIS — E785 Hyperlipidemia, unspecified: Secondary | ICD-10-CM | POA: Diagnosis present

## 2011-08-13 DIAGNOSIS — E1149 Type 2 diabetes mellitus with other diabetic neurological complication: Secondary | ICD-10-CM | POA: Diagnosis present

## 2011-08-13 DIAGNOSIS — E1142 Type 2 diabetes mellitus with diabetic polyneuropathy: Secondary | ICD-10-CM | POA: Diagnosis present

## 2011-08-13 DIAGNOSIS — H409 Unspecified glaucoma: Secondary | ICD-10-CM | POA: Diagnosis present

## 2011-08-13 DIAGNOSIS — N390 Urinary tract infection, site not specified: Secondary | ICD-10-CM

## 2011-08-13 DIAGNOSIS — Z8249 Family history of ischemic heart disease and other diseases of the circulatory system: Secondary | ICD-10-CM

## 2011-08-13 DIAGNOSIS — Z833 Family history of diabetes mellitus: Secondary | ICD-10-CM

## 2011-08-13 DIAGNOSIS — I10 Essential (primary) hypertension: Secondary | ICD-10-CM | POA: Diagnosis present

## 2011-08-13 DIAGNOSIS — G250 Essential tremor: Secondary | ICD-10-CM | POA: Diagnosis present

## 2011-08-13 LAB — TSH: TSH: 2.182 u[IU]/mL (ref 0.350–4.500)

## 2011-08-13 LAB — BASIC METABOLIC PANEL
BUN: 15 mg/dL (ref 6–23)
Calcium: 11.1 mg/dL — ABNORMAL HIGH (ref 8.4–10.5)
Calcium: 10.2 mg/dL (ref 8.4–10.5)
GFR calc Af Amer: >90 mL/min (ref >90)
GFR calc Af Amer: >90 mL/min (ref >90)
GFR calc non Af Amer: 82 mL/min — ABNORMAL LOW (ref >90)
GFR calc non Af Amer: 81 mL/min — ABNORMAL LOW (ref >90)
Glucose, Bld: 355 mg/dL — ABNORMAL HIGH (ref 70–99)
Potassium: 4 mEq/L (ref 3.5–5.1)
Potassium: 3.2 mEq/L — ABNORMAL LOW (ref 3.5–5.1)
Sodium: 131 mEq/L — ABNORMAL LOW (ref 135–145)

## 2011-08-13 LAB — CBC
Hemoglobin: 15.8 g/dL — ABNORMAL HIGH (ref 12.0–15.0)
MCH: 29.6 pg (ref 26.0–34.0)
MCHC: 36.2 g/dL — ABNORMAL HIGH (ref 30.0–36.0)
Platelets: 221 10*3/uL (ref 150–400)

## 2011-08-13 LAB — DIFFERENTIAL
Basophils Relative: 0 % (ref 0–1)
Eosinophils Absolute: 0.1 10*3/uL (ref 0.0–0.7)
Eosinophils Relative: 1 % (ref 0–5)
Monocytes Relative: 9 % (ref 3–12)
Neutrophils Relative %: 75 % (ref 43–77)

## 2011-08-13 LAB — CARDIAC PANEL(CRET KIN+CKTOT+MB+TROPI)
CK, MB: 14.3 ng/mL (ref 0.3–4.0)
CK, MB: 15.5 ng/mL (ref 0.3–4.0)
Total CK: 132 U/L (ref 7–177)
Troponin I: 2.43 ng/mL (ref <0.30)

## 2011-08-13 LAB — PROTIME-INR: INR: 2.06 — ABNORMAL HIGH (ref 0.00–1.49)

## 2011-08-13 LAB — GLUCOSE, CAPILLARY: Glucose-Capillary: 364 mg/dL — ABNORMAL HIGH (ref 70–99)

## 2011-08-13 LAB — MAGNESIUM: Magnesium: 1.1 mg/dL — ABNORMAL LOW (ref 1.5–2.5)

## 2011-08-13 MED ORDER — SODIUM CHLORIDE 0.9 % IJ SOLN: 3.0000 mL | INTRAMUSCULAR | Status: DC | PRN

## 2011-08-13 MED ORDER — DILTIAZEM HCL 100 MG IV SOLR
20.0000 mg/h | INTRAVENOUS | Status: DC
  Administered 2011-08-13 – 2011-08-14 (×4): 20 mg/h via INTRAVENOUS
  Filled 2011-08-13 (×5): qty 100

## 2011-08-13 MED ORDER — INSULIN ASPART PROT & ASPART (70-30 MIX) 100 UNIT/ML ~~LOC~~ SUSP
20.0000 [IU] | Freq: Every day | SUBCUTANEOUS | Status: DC
  Administered 2011-08-13 – 2011-08-16 (×4): 20 [IU] via SUBCUTANEOUS

## 2011-08-13 MED ORDER — ASPIRIN 81 MG PO TABS: 81.0000 mg | ORAL_TABLET | Freq: Every day | ORAL | Status: DC

## 2011-08-13 MED ORDER — POTASSIUM CHLORIDE CRYS ER 20 MEQ PO TBCR
40.0000 meq | EXTENDED_RELEASE_TABLET | Freq: Once | ORAL | Status: AC
  Administered 2011-08-13: 40 meq via ORAL
  Filled 2011-08-13: qty 2

## 2011-08-13 MED ORDER — METOPROLOL TARTRATE 50 MG PO TABS
50.0000 mg | ORAL_TABLET | Freq: Two times a day (BID) | ORAL | Status: DC
  Administered 2011-08-13 – 2011-08-17 (×7): 50 mg via ORAL
  Filled 2011-08-13 (×10): qty 1

## 2011-08-13 MED ORDER — COLESEVELAM HCL 625 MG PO TABS
1250.0000 mg | ORAL_TABLET | Freq: Every day | ORAL | Status: DC
  Administered 2011-08-13 – 2011-08-17 (×4): 1250 mg via ORAL
  Filled 2011-08-13 (×6): qty 2

## 2011-08-13 MED ORDER — SODIUM CHLORIDE 0.9 % IV SOLN: 250.0000 mL | INTRAVENOUS | Status: DC | PRN

## 2011-08-13 MED ORDER — ASPIRIN EC 81 MG PO TBEC
81.0000 mg | DELAYED_RELEASE_TABLET | Freq: Every day | ORAL | Status: DC
  Administered 2011-08-14 – 2011-08-17 (×4): 81 mg via ORAL
  Filled 2011-08-13 (×4): qty 1

## 2011-08-13 MED ORDER — OMEGA-3-ACID ETHYL ESTERS 1 G PO CAPS
1.0000 g | ORAL_CAPSULE | Freq: Every day | ORAL | Status: DC
  Administered 2011-08-14 – 2011-08-16 (×3): 1 g via ORAL
  Filled 2011-08-13 (×4): qty 1

## 2011-08-13 MED ORDER — DEXTROSE 5 % IV SOLN
5.0000 mg/h | INTRAVENOUS | Status: DC
  Administered 2011-08-13: 5 mg/h via INTRAVENOUS
  Filled 2011-08-13: qty 100

## 2011-08-13 MED ORDER — DILTIAZEM HCL 50 MG/10ML IV SOLN
10.0000 mg | Freq: Once | INTRAVENOUS | Status: AC
  Administered 2011-08-13: 10 mg via INTRAVENOUS
  Filled 2011-08-13: qty 2

## 2011-08-13 MED ORDER — DIGOXIN 125 MCG PO TABS
125.0000 ug | ORAL_TABLET | Freq: Every day | ORAL | Status: DC
  Administered 2011-08-14 – 2011-08-17 (×3): 125 ug via ORAL
  Filled 2011-08-13 (×4): qty 1

## 2011-08-13 MED ORDER — ACETAMINOPHEN 325 MG PO TABS: 650.0000 mg | ORAL_TABLET | ORAL | Status: DC | PRN

## 2011-08-13 MED ORDER — ZOLPIDEM TARTRATE 5 MG PO TABS: 5.0000 mg | ORAL_TABLET | Freq: Every evening | ORAL | Status: DC | PRN

## 2011-08-13 MED ORDER — NITROGLYCERIN IN D5W 200-5 MCG/ML-% IV SOLN: 5.0000 ug/min | INTRAVENOUS | Status: DC

## 2011-08-13 MED ORDER — GLIMEPIRIDE 4 MG PO TABS
4.0000 mg | ORAL_TABLET | Freq: Every day | ORAL | Status: DC
  Administered 2011-08-14 – 2011-08-17 (×3): 4 mg via ORAL
  Filled 2011-08-13 (×5): qty 1

## 2011-08-13 MED ORDER — SODIUM CHLORIDE 0.9 % IJ SOLN
3.0000 mL | Freq: Two times a day (BID) | INTRAMUSCULAR | Status: DC
  Administered 2011-08-14 – 2011-08-16 (×5): 3 mL via INTRAVENOUS

## 2011-08-13 MED ORDER — ONDANSETRON HCL 4 MG/2ML IJ SOLN: 4.0000 mg | Freq: Four times a day (QID) | INTRAMUSCULAR | Status: DC | PRN

## 2011-08-13 MED ORDER — NITROGLYCERIN 0.4 MG SL SUBL: 0.4000 mg | SUBLINGUAL_TABLET | SUBLINGUAL | Status: DC | PRN

## 2011-08-13 MED ORDER — WARFARIN SODIUM 5 MG PO TABS
5.0000 mg | ORAL_TABLET | Freq: Once | ORAL | Status: DC
  Filled 2011-08-13: qty 1

## 2011-08-13 MED ORDER — LATANOPROST 0.005 % OP SOLN
1.0000 [drp] | Freq: Every day | OPHTHALMIC | Status: DC
  Administered 2011-08-13 – 2011-08-16 (×4): 1 [drp] via OPHTHALMIC
  Filled 2011-08-13: qty 2.5

## 2011-08-13 MED ORDER — POTASSIUM CHLORIDE CRYS ER 10 MEQ PO TBCR
10.0000 meq | EXTENDED_RELEASE_TABLET | Freq: Every day | ORAL | Status: DC
  Administered 2011-08-13 – 2011-08-17 (×5): 10 meq via ORAL
  Filled 2011-08-13 (×5): qty 1

## 2011-08-13 MED ORDER — FUROSEMIDE 80 MG PO TABS
80.0000 mg | ORAL_TABLET | Freq: Two times a day (BID) | ORAL | Status: DC
  Administered 2011-08-13 – 2011-08-16 (×6): 80 mg via ORAL
  Filled 2011-08-13 (×10): qty 1

## 2011-08-13 MED ORDER — OMEGA-3 FATTY ACIDS 1000 MG PO CAPS: 1.0000 g | ORAL_CAPSULE | Freq: Every day | ORAL | Status: DC

## 2011-08-13 MED ORDER — ALPRAZOLAM 0.25 MG PO TABS: 0.2500 mg | ORAL_TABLET | Freq: Two times a day (BID) | ORAL | Status: DC | PRN

## 2011-08-13 MED ORDER — INSULIN ASPART 100 UNIT/ML ~~LOC~~ SOLN
0.0000 [IU] | Freq: Three times a day (TID) | SUBCUTANEOUS | Status: DC
  Administered 2011-08-13: 11 [IU] via SUBCUTANEOUS
  Administered 2011-08-14: 8 [IU] via SUBCUTANEOUS
  Administered 2011-08-14: 5 [IU] via SUBCUTANEOUS
  Administered 2011-08-14: 15 [IU] via SUBCUTANEOUS
  Administered 2011-08-15: 8 [IU] via SUBCUTANEOUS
  Administered 2011-08-15: 5 [IU] via SUBCUTANEOUS
  Administered 2011-08-15: 3 [IU] via SUBCUTANEOUS
  Administered 2011-08-16: 5 [IU] via SUBCUTANEOUS
  Administered 2011-08-16: 8 [IU] via SUBCUTANEOUS
  Administered 2011-08-17: 5 [IU] via SUBCUTANEOUS
  Filled 2011-08-13: qty 3

## 2011-08-13 MED ORDER — VITAMIN D 1000 UNITS PO CAPS: 1000.0000 [IU] | ORAL_CAPSULE | Freq: Every day | ORAL | Status: DC

## 2011-08-13 MED ORDER — INSULIN ASPART PROT & ASPART (70-30 MIX) 100 UNIT/ML ~~LOC~~ SUSP
30.0000 [IU] | Freq: Every day | SUBCUTANEOUS | Status: DC
  Administered 2011-08-14 – 2011-08-17 (×3): 30 [IU] via SUBCUTANEOUS
  Filled 2011-08-13: qty 3

## 2011-08-13 MED ORDER — VITAMIN D3 25 MCG (1000 UNIT) PO TABS
1000.0000 [IU] | ORAL_TABLET | Freq: Every day | ORAL | Status: DC
  Administered 2011-08-14 – 2011-08-17 (×4): 1000 [IU] via ORAL
  Filled 2011-08-13 (×4): qty 1

## 2011-08-13 NOTE — ED Notes (Signed)
HR continues to fluctuate between 110-120's. Cardizem gtt was increased to 15mg /hr, infusing to RAC IV site, no redness nor swelling noted. Will continue to monitor.

## 2011-08-13 NOTE — H&P (Signed)
Patient ID: Alexandria [name] MRN: [id number], DOB/AGE: March 04, 1930   Admit date: 08/13/2011   Primary Physician: [name], MD, MD Primary Cardiologist: J. Hochrein  Pt. Profile:  78 y/o female w/ h/o PAF and diastolic chf who presented to the [facility] ED with c/p, dyspnea, and found to be in rapid a.fib.  Problem List  Past Medical History  Diagnosis Date  . Hypertension     with h/o hypertensive urgency  . Glaucoma   . Tremor, essential   . Diabetes mellitus with neuropathy   . B12 deficiency   . Hyperlipidemia   . Atrial fibrillation     a. on chronic coumadin  . Chronic diastolic heart failure     echo 05/2009: mild LVH, EF 55-60%, grade 2 diast dysfxn, mild LAE, PASP  . CAD (coronary artery disease)     a.  LHC 06/2009: pLAD 40%, prox-mid D1 80% (small);  CFX 40-50%, mRCA 20%, EF 65%  -  med Rx;  b.  05/23/11 Myoview - EF 53%, no isch/inf  . Carotid stenosis     dopplers 6/12: 1-39% bilat ICA  . Pulmonary fibrosis     Dr. Vassie Loll;  patient taken off O2 in 8/12; dyspnea felt CHF>>ILD  . Skin cancer   . Shortness of breath     Past Surgical History  Procedure Date  . Appendectomy   . Nose surgery   . Cardiac catheterization      Allergies  Allergies  Allergen Reactions  . Ace Inhibitors Cough    Enalapril  benicor    . Crestor (Rosuvastatin Calcium)     Fatigue and leg pain   . Lipitor (Atorvastatin Calcium)     Myalgias   . Niaspan (Niacin)     Intol   . Olmesartan   . Prednisone     REACTION: "retained fluid"    HPI  78 y/o female with prior h/o PAF (chronic coumadin), diast chf, pulm fibrosis, and c/p with nonobs cad.  She has chronic doe, which has been stable.  She weighs herself 3-5x/wk and has not noted any significant change.  She was in her usoh until she got up this am around 8a and developed severe, 10/10 chest pain radiating to her wrists assoc with sob and tachypalps.  Her blood sugar was in the 350's.  For some reason she didn't take any of  her usual meds and sat/rested.  Ss seemed to ease up with sitting.  After a few hrs, she called her niece, who recommended that she call EMS. Pt called EMS around 10:30 and they arrived just after 11am.  Pt was in rapid a.fib in the 170's.  She was given a sl ntg w/o much change in Ss, placed on O2, and taken to the [facility] ED.  Here, she was hypertensive and treated with Dilt 10mg  bolus followed by infusion, which has been titrated to 15mg /hr.  Rates are currently in the 1-teens to 120's.  Chest pain is rated @ 3/10 but overall she is much more comfortable.  Home Medications  Medications Prior to Admission  Medication Dose Route Frequency Provider Last Rate Last Dose  . diltiazem (CARDIZEM) 100 mg in dextrose 5 % 100 mL infusion  5-15 mg/hr Intravenous Titrated Dayton Bailiff, MD 5 mL/hr at 08/13/11 1227 5 mg/hr at 08/13/11 1227  . diltiazem (CARDIZEM) injection SOLN 10 mg  10 mg Intravenous Once Dayton Bailiff, MD   10 mg at 08/13/11 1235   Medications Prior to Admission  Medication  Sig Dispense Refill  . Cholecalciferol (VITAMIN D) 1000 UNITS capsule Take 1,000 Units by mouth daily. OTC        . colesevelam (WELCHOL) 625 MG tablet Take 1,250 mg by mouth daily.        Marland Kitchen diltiazem (TIAZAC) 420 MG 24 hr capsule Take 420 mg by mouth daily.      Marland Kitchen glimepiride (AMARYL) 4 MG tablet Take 1 tablet by mouth Twice daily.      Marland Kitchen latanoprost (XALATAN) 0.005 % ophthalmic solution 1 drop at bedtime.        . metFORMIN (GLUCOPHAGE) 1000 MG tablet Take 1,000 mg by mouth 2 (two) times daily with a meal.      . metoprolol (LOPRESSOR) 50 MG tablet Take 50 mg by mouth 2 (two) times daily.      Marland Kitchen NOVOLOG MIX 70/30 FLEXPEN (70-30) 100 UNIT/ML injection Inject 20-30 Units into the skin Twice daily. 30 units in the morning & 20 units in the evening      . warfarin (COUMADIN) 5 MG tablet Take 5-10 mg by mouth daily. Alternates days and takes 10 mg on Tuesdays, Thursdays & Saturday.  & 5 mg other days of the week.      Marland Kitchen  Besifloxacin HCl (BESIVANCE) 0.6 % SUSP Place 1 drop into both eyes 4 (four) times daily as needed. For 2 days following eye injections       . nitroGLYCERIN (NITROSTAT) 0.4 MG SL tablet Place 0.4 mg under the tongue every 5 (five) minutes as needed. As needed for chest pain        Family History  Problem Relation Age of Onset  . Throat cancer Brother   . Prostate cancer Father   . Diabetes Mother   . Diabetes Brother     multiple  . Heart disease Brother     History   Social History  . Marital Status: Widowed    Spouse Name: N/A    Number of Children: N/A  . Years of Education: N/A   Occupational History  . Not on file.   Social History Main Topics  . Smoking status: Never Smoker   . Smokeless tobacco: Never Used  . Alcohol Use: No  . Drug Use: No  . Sexually Active: Not on file   Other Topics Concern  . Not on file   Social History Narrative  . No narrative on file     Review of Systems General:  No chills, fever, night sweats or weight changes.  Cardiovascular:  +++ chest pain and tachypalps.  Mild dyspnea @ rest today.  Chronic dyspnea on exertion - has not changed recently.  Can walk out to mailbox.  No edema, orthopnea, palpitations, paroxysmal nocturnal dyspnea. Dermatological: No rash, lesions/masses Respiratory: No cough, dyspnea Urologic: No hematuria, dysuria Abdominal:   No nausea, vomiting, diarrhea, bright red blood per rectum, melena, or hematemesis Neurologic:  No visual changes, wkns, changes in mental status. All other systems reviewed and are otherwise negative except as noted above.  Physical Exam  Blood pressure 131/58, pulse 113, temperature 98.5 F (36.9 C), temperature source Oral, resp. rate 19, SpO2 99.00%.  General: Pleasant, NAD Psych: Normal affect. Neuro: Alert and oriented X 3. Moves all extremities spontaneously. HEENT: Normal  Neck: Supple with soft r carotid bruit.  jvp ~ 10cm. Lungs:  Resp regular and unlabored, crackles 1/3  up bilat. Heart: ir ir, tachy. 2/6 sm heard throughout.  no s3, s4. Abdomen: Soft, non-tender, non-distended, BS + x  4.  Extremities: No clubbing, cyanosis or edema. DP/PT/Radials 1+ and equal bilaterally.  Labs   Lab Results  Component Value Date   WBC 13.2* 08/13/2011   HGB 15.8* 08/13/2011   HCT 43.7 08/13/2011   MCV 81.8 08/13/2011   PLT 221 08/13/2011     Lab 08/13/11 1215  NA 133*  K 4.0  CL 90*  CO2 24  BUN 15  CREATININE 0.65  CALCIUM 11.1*  PROT --  BILITOT --  ALKPHOS --  ALT --  AST --  GLUCOSE 411*    Lab Results  Component Value Date   INR 2.06* 08/13/2011   INR 1.70* 05/23/2011   INR 1.45 05/22/2011     Radiology/Studies  Dg Chest Port 1 View  08/13/2011  *RADIOLOGY REPORT*  Clinical Data: Shortness of breath, pain  PORTABLE CHEST - 1 VIEW  Comparison: Chest x-ray of 05/20/2011  Findings: No active infiltrate or effusion is seen.  There is cardiomegaly present which is stable.  No acute bony abnormality is noted.  IMPRESSION: Stable cardiomegaly.  No active lung disease.  Original Report Authenticated By: Juline Patch, M.D.    ECG  A. Fib, 178, poor r prog, inflat st dep  ASSESSMENT AND PLAN  1.  Afib w/ RVR:  Pt with recurrent afib, presumably starting this am.  Rate was quite fast earlier and has improved with IV Dilt.  She did not take her usual PO Dilt or lopressor this am.  We'll continue IV dilt for now and resume po dose of metoprolol.  If rate slows further or she converts, will resume PO Dilt.  As she does not tolerate a.fib well, we may need to consider antiarrhythmic initiation - whether or not she converts on ccb/bb alone.  Pts INR is therapeutic today and she says it's been therapeutic each of the last few times she's had it checked (1x/month).  Cont coumadin.    2.  [place]:  In setting of #1.  Cycle CE.  Cont home meds.  Add asa.  If she rules in, will need to reconsider cath and hold coumadin.  Recent negative myoview.  Add IV ntg with  elevated bp and residual c/p.  3.  Chronic Diast. Chf:  Volume looks fair.  Cont to work on rate control and bp.  4.  HTN:  See above.   Signed, Nicolasa Ducking, NP 08/13/2011, 2:41 PM  The patient was seen in ER with Ward Givens, NP.  She has already improved considerably with rate control with IV diltiazem.  Paradoxically she had failed to take her usual morning meds [lopressor and diltiazem] this morning.  Her EKG shows significant ST depression related to her rapid rate. Chest xray shows stable cardiomegaly. Physical exam reveals bilateral rales consistent with her known pulmonary fibrosis. She has a soft systolic ejection murmur at base.  Last echo 2010 and will update her echo this admission.  Will check thyroid function. I agree with findings and plan as noted above.

## 2011-08-13 NOTE — ED Notes (Signed)
Pt was brought in by EMS with c/o chest discomfort and rapid heart beat. Pt has a hx of atrial fibrillation. Pt is attached to the monitor, O2 started at 2 LPM/[place]. Pt took 4 Baby ASA PTA.

## 2011-08-13 NOTE — ED Notes (Addendum)
Pt was brought in by EMS with c/o chest discomfort and rapid heart beat. Pt has a hx of tachycardia, atrial fibrillation

## 2011-08-13 NOTE — ED Notes (Signed)
CBG 364 at 14:17

## 2011-08-13 NOTE — Progress Notes (Addendum)
ANTICOAGULATION CONSULT NOTE - Follow Up  Pharmacy Consult for  Heparin/Coumadin (now on hold) Indication:  Afib and Positive cardiac enzymes  Allergies  Allergen Reactions  . Ace Inhibitors Cough    Enalapril  benicor    . Crestor (Rosuvastatin Calcium)     Fatigue and leg pain   . Lipitor (Atorvastatin Calcium)     Myalgias   . Niaspan (Niacin)     Intol   . Olmesartan   . Prednisone     REACTION: "retained fluid"    Patient Measurements: Height: 5\' 4"  (162.6 cm) Weight: 153 lb 3.5 oz (69.5 kg) IBW/kg (Calculated) : 54.7   Vital Signs: Temp: 98 F (36.7 C) (02/15 1527) Temp src: Oral (02/15 1527) BP: 157/73 mmHg (02/15 1527) Pulse Rate: 96  (02/15 1527)  Labs:  Basename 08/13/11 1551 08/13/11 1215  HGB -- 15.8*  HCT -- 43.7  PLT -- 221  APTT -- 38*  LABPROT -- 23.6*  INR -- 2.06*  HEPARINUNFRC -- --  CREATININE 0.67 0.65  CKTOTAL 132 --  CKMB 14.3* --  TROPONINI 2.43* --   Estimated Creatinine Clearance: 54.6 ml/min (by C-G formula based on Cr of 0.67).  Medical History: Past Medical History  Diagnosis Date  . Hypertension     with h/o hypertensive urgency  . Glaucoma   . Tremor, essential   . Diabetes mellitus with neuropathy   . B12 deficiency   . Hyperlipidemia   . Atrial fibrillation     a. on chronic coumadin  . Chronic diastolic heart failure     echo 05/2009: mild LVH, EF 55-60%, grade 2 diast dysfxn, mild LAE, PASP  . CAD (coronary artery disease)     a.  LHC 06/2009: pLAD 40%, prox-mid D1 80% (small);  CFX 40-50%, mRCA 20%, EF 65%  -  med Rx;  b.  05/23/11 Myoview - EF 53%, no isch/inf  . Carotid stenosis     dopplers 6/12: 1-39% bilat ICA  . Pulmonary fibrosis     Dr. Vassie Loll;  patient taken off O2 in 8/12; dyspnea felt CHF>>ILD  . Skin cancer   . Shortness of breath     Medications:  Prescriptions prior to admission  Medication Sig Dispense Refill  . aspirin 81 MG tablet Take 324 mg by mouth daily.      . Cholecalciferol  (VITAMIN D) 1000 UNITS capsule Take 1,000 Units by mouth daily. OTC        . colesevelam (WELCHOL) 625 MG tablet Take 1,250 mg by mouth daily.        . digoxin (LANOXIN) 0.125 MG tablet Take 125 mcg by mouth daily.      Marland Kitchen diltiazem (TIAZAC) 420 MG 24 hr capsule Take 420 mg by mouth daily.      . fish oil-omega-3 fatty acids 1000 MG capsule Take 1 g by mouth daily.      . furosemide (LASIX) 40 MG tablet Take 80 mg by mouth 2 (two) times daily.      Marland Kitchen glimepiride (AMARYL) 4 MG tablet Take 1 tablet by mouth Twice daily.      . isosorbide mononitrate (IMDUR) 30 MG 24 hr tablet Take 30 mg by mouth daily.      Marland Kitchen latanoprost (XALATAN) 0.005 % ophthalmic solution 1 drop at bedtime.        . metFORMIN (GLUCOPHAGE) 1000 MG tablet Take 1,000 mg by mouth 2 (two) times daily with a meal.      . metoprolol (LOPRESSOR) 50  MG tablet Take 50 mg by mouth 2 (two) times daily.      Marland Kitchen NOVOLOG MIX 70/30 FLEXPEN (70-30) 100 UNIT/ML injection Inject 20-30 Units into the skin Twice daily. 30 units in the morning & 20 units in the evening      . potassium chloride (K-DUR,KLOR-CON) 10 MEQ tablet Take 10 mEq by mouth daily.      Marland Kitchen warfarin (COUMADIN) 5 MG tablet Take 5-10 mg by mouth daily. Alternates days and takes 10 mg on Tuesdays, Thursdays & Saturday.  & 5 mg other days of the week.      Marland Kitchen Besifloxacin HCl (BESIVANCE) 0.6 % SUSP Place 1 drop into both eyes 4 (four) times daily as needed. For 2 days following eye injections       . nitroGLYCERIN (NITROSTAT) 0.4 MG SL tablet Place 0.4 mg under the tongue every 5 (five) minutes as needed. As needed for chest pain        Assessment: 81 yof on chronic coumadin for atrial fibrillation. Pt admitted with CP, SOB and found to be in rapid afib. INR today is therapeutic at 2.06. No bleeding noted. Last dose was 2/14.  She now has elevated cardiac enzymes and the plan is to hold Warfarin for now and start IV heparin.    Goal of Therapy:  Heparin Level - Xa 0.3-0.7   Plan:    1).  Will check PT/INR at 8 PM tonight and start IV heparin if INR is < 2.0. 2. Daily heparin level/cbc while on Heparin therapy.   Nadara Mustard, PharmD., MS Clinical Pharmacist Pager:  [phone] 08/13/2011,5:23 PM   PT/INR still elevated despite holding Warfarin.  Will recheck protime in AM and start heparin then if < 2.0.  Nadara Mustard, PharmD., MS Clinical Pharmacist Pager  [phone]

## 2011-08-13 NOTE — Progress Notes (Signed)
Rec'd a call from RN that enzymes were elevated. Reviewed labs, both CKMB and Trop I are elevated. Pt is pain-free and HR is about 100. Her K+ was 3.2.  Will d/c coumadin, add heparin when INR < 2.0, give K+ 40 meq, and increase cardizem to 20 mg/hour for better HR control. MD to advise on cath Monday.

## 2011-08-13 NOTE — Progress Notes (Signed)
ANTICOAGULATION CONSULT NOTE - Initial Consult  Pharmacy Consult for coumadin Indication: atrial fibrillation  Allergies  Allergen Reactions  . Ace Inhibitors Cough    Enalapril  benicor    . Crestor (Rosuvastatin Calcium)     Fatigue and leg pain   . Lipitor (Atorvastatin Calcium)     Myalgias   . Niaspan (Niacin)     Intol   . Olmesartan   . Prednisone     REACTION: "retained fluid"    Patient Measurements: Height: 5\' 4"  (162.6 cm) Weight: 153 lb 3.5 oz (69.5 kg) IBW/kg (Calculated) : 54.7   Vital Signs: Temp: 98 F (36.7 C) (02/15 1527) Temp src: Oral (02/15 1527) BP: 157/73 mmHg (02/15 1527) Pulse Rate: 96  (02/15 1527)  Labs:  Basename 08/13/11 1215  HGB 15.8*  HCT 43.7  PLT 221  APTT 38*  LABPROT 23.6*  INR 2.06*  HEPARINUNFRC --  CREATININE 0.65  CKTOTAL --  CKMB --  TROPONINI --   Estimated Creatinine Clearance: 54.6 ml/min (by C-G formula based on Cr of 0.65).  Medical History: Past Medical History  Diagnosis Date  . Hypertension     with h/o hypertensive urgency  . Glaucoma   . Tremor, essential   . Diabetes mellitus with neuropathy   . B12 deficiency   . Hyperlipidemia   . Atrial fibrillation     a. on chronic coumadin  . Chronic diastolic heart failure     echo 05/2009: mild LVH, EF 55-60%, grade 2 diast dysfxn, mild LAE, PASP  . CAD (coronary artery disease)     a.  LHC 06/2009: pLAD 40%, prox-mid D1 80% (small);  CFX 40-50%, mRCA 20%, EF 65%  -  med Rx;  b.  05/23/11 Myoview - EF 53%, no isch/inf  . Carotid stenosis     dopplers 6/12: 1-39% bilat ICA  . Pulmonary fibrosis     Dr. Vassie Loll;  patient taken off O2 in 8/12; dyspnea felt CHF>>ILD  . Skin cancer   . Shortness of breath     Medications:  Prescriptions prior to admission  Medication Sig Dispense Refill  . aspirin 81 MG tablet Take 324 mg by mouth daily.      . Cholecalciferol (VITAMIN D) 1000 UNITS capsule Take 1,000 Units by mouth daily. OTC        . colesevelam  (WELCHOL) 625 MG tablet Take 1,250 mg by mouth daily.        . digoxin (LANOXIN) 0.125 MG tablet Take 125 mcg by mouth daily.      Marland Kitchen diltiazem (TIAZAC) 420 MG 24 hr capsule Take 420 mg by mouth daily.      . fish oil-omega-3 fatty acids 1000 MG capsule Take 1 g by mouth daily.      . furosemide (LASIX) 40 MG tablet Take 80 mg by mouth 2 (two) times daily.      Marland Kitchen glimepiride (AMARYL) 4 MG tablet Take 1 tablet by mouth Twice daily.      . isosorbide mononitrate (IMDUR) 30 MG 24 hr tablet Take 30 mg by mouth daily.      Marland Kitchen latanoprost (XALATAN) 0.005 % ophthalmic solution 1 drop at bedtime.        . metFORMIN (GLUCOPHAGE) 1000 MG tablet Take 1,000 mg by mouth 2 (two) times daily with a meal.      . metoprolol (LOPRESSOR) 50 MG tablet Take 50 mg by mouth 2 (two) times daily.      Marland Kitchen NOVOLOG MIX 70/30 FLEXPEN (  70-30) 100 UNIT/ML injection Inject 20-30 Units into the skin Twice daily. 30 units in the morning & 20 units in the evening      . potassium chloride (K-DUR,KLOR-CON) 10 MEQ tablet Take 10 mEq by mouth daily.      Marland Kitchen warfarin (COUMADIN) 5 MG tablet Take 5-10 mg by mouth daily. Alternates days and takes 10 mg on Tuesdays, Thursdays & Saturday.  & 5 mg other days of the week.      Marland Kitchen Besifloxacin HCl (BESIVANCE) 0.6 % SUSP Place 1 drop into both eyes 4 (four) times daily as needed. For 2 days following eye injections       . nitroGLYCERIN (NITROSTAT) 0.4 MG SL tablet Place 0.4 mg under the tongue every 5 (five) minutes as needed. As needed for chest pain        Assessment: 81 yof on chronic coumadin for atrial fibrillation. Pt admitted with CP, SOB and found to be in rapid afib. INR today is therapeutic at 2.06. No bleeding noted. Last dose was 2/14.   Goal of Therapy:  INR 2-3   Plan:  1. Coumadin 5mg  PO x 1 tonight 2. Daily INR  Jacinta Penalver, Drake Leach 08/13/2011,3:33 PM

## 2011-08-13 NOTE — ED Notes (Signed)
Dr. Brooke Dare was informed about the patient's condition.

## 2011-08-13 NOTE — ED Notes (Addendum)
Pt's HR has been fluctuating between 110-130's VSS. Cardizem gtt increased to 10mg /hr, infusing to RAC IV site, no redness nor swelling noted. Will monitor

## 2011-08-13 NOTE — ED Provider Notes (Signed)
History     CSN: [id number]  Arrival date & time 08/13/11  1149   First MD Initiated Contact with Patient 08/13/11 1159      Chief Complaint  Patient presents with  . Chest Pain  . Tachycardia    (Consider location/radiation/quality/duration/timing/severity/associated sxs/prior treatment) HPI Comments: Hx of atrial fibrillation. States that her heart began racing out of her chest in the past several hours. Has associated substernal chest pain. Some mild associated shortness breath.  Patient is a 78 y.o. female presenting with chest pain.  Chest Pain The chest pain began 12 - 24 hours ago. Chest pain occurs constantly. The chest pain is worsening. The severity of the pain is mild. The quality of the pain is described as aching. The pain does not radiate. Chest pain is worsened by certain positions. Primary symptoms include fatigue, shortness of breath and palpitations. Pertinent negatives for primary symptoms include no fever, no cough, no wheezing, no abdominal pain, no nausea, no vomiting and no dizziness.  The palpitations began 13 to 24 hours ago. The onset of the palpitations was gradual. The palpitations also occurred with shortness of breath. The palpitations did not occur with dizziness.  Pertinent negatives for associated symptoms include no numbness and no weakness.     Past Medical History  Diagnosis Date  . Hypertension     with h/o hypertensive urgency  . Glaucoma   . Tremor, essential   . Diabetes mellitus with neuropathy   . B12 deficiency   . Hyperlipidemia   . Atrial fibrillation     a. on chronic coumadin  . Chronic diastolic heart failure     echo 05/2009: mild LVH, EF 55-60%, grade 2 diast dysfxn, mild LAE, PASP  . CAD (coronary artery disease)     a.  LHC 06/2009: pLAD 40%, prox-mid D1 80% (small);  CFX 40-50%, mRCA 20%, EF 65%  -  med Rx;  b.  05/23/11 Myoview - EF 53%, no isch/inf  . Carotid stenosis     dopplers 6/12: 1-39% bilat ICA  . Pulmonary  fibrosis     Dr. Vassie Loll;  patient taken off O2 in 8/12; dyspnea felt CHF>>ILD  . Skin cancer   . Shortness of breath     Past Surgical History  Procedure Date  . Appendectomy   . Nose surgery   . Cardiac catheterization     Family History  Problem Relation Age of Onset  . Throat cancer Brother   . Prostate cancer Father   . Diabetes Mother   . Diabetes Brother     multiple  . Heart disease Brother     History  Substance Use Topics  . Smoking status: Never Smoker   . Smokeless tobacco: Never Used  . Alcohol Use: No    OB History    Grav Para Term Preterm Abortions TAB SAB Ect Mult Living                  Review of Systems  Constitutional: Positive for fatigue. Negative for fever, chills, activity change and appetite change.  HENT: Negative for congestion, sore throat, rhinorrhea, neck pain and neck stiffness.   Respiratory: Positive for shortness of breath. Negative for cough and wheezing.   Cardiovascular: Positive for chest pain and palpitations.  Gastrointestinal: Negative for nausea, vomiting and abdominal pain.  Genitourinary: Negative for dysuria, urgency, frequency and flank pain.  Neurological: Negative for dizziness, weakness, light-headedness, numbness and headaches.  All other systems reviewed and are negative.  Allergies  Ace inhibitors; Crestor; Lipitor; Niaspan; Olmesartan; and Prednisone  Home Medications   Current Outpatient Rx  Name Route Sig Dispense Refill  . ASPIRIN 81 MG PO TABS Oral Take 324 mg by mouth daily.    Marland Kitchen VITAMIN D 1000 UNITS PO CAPS Oral Take 1,000 Units by mouth daily. OTC      . COLESEVELAM HCL 625 MG PO TABS Oral Take 1,250 mg by mouth daily.      Marland Kitchen DIGOXIN 0.125 MG PO TABS Oral Take 125 mcg by mouth daily.    Marland Kitchen DILTIAZEM HCL ER BEADS 420 MG PO CP24 Oral Take 420 mg by mouth daily.    . OMEGA-3 FATTY ACIDS 1000 MG PO CAPS Oral Take 1 g by mouth daily.    . FUROSEMIDE 40 MG PO TABS Oral Take 80 mg by mouth 2 (two) times  daily.    Marland Kitchen GLIMEPIRIDE 4 MG PO TABS Oral Take 1 tablet by mouth Twice daily.    . ISOSORBIDE MONONITRATE ER 30 MG PO TB24 Oral Take 30 mg by mouth daily.    Marland Kitchen LATANOPROST 0.005 % OP SOLN  1 drop at bedtime.      Marland Kitchen METFORMIN HCL 1000 MG PO TABS Oral Take 1,000 mg by mouth 2 (two) times daily with a meal.    . METOPROLOL TARTRATE 50 MG PO TABS Oral Take 50 mg by mouth 2 (two) times daily.    Marland Kitchen NOVOLOG MIX 70/30 FLEXPEN (70-30) 100 UNIT/ML  SUSP Subcutaneous Inject 20-30 Units into the skin Twice daily. 30 units in the morning & 20 units in the evening    . POTASSIUM CHLORIDE CRYS ER 10 MEQ PO TBCR Oral Take 10 mEq by mouth daily.    . WARFARIN SODIUM 5 MG PO TABS Oral Take 5-10 mg by mouth daily. Alternates days and takes 10 mg on Tuesdays, Thursdays & Saturday.  & 5 mg other days of the week.    . BESIFLOXACIN HCL 0.6 % OP SUSP Both Eyes Place 1 drop into both eyes 4 (four) times daily as needed. For 2 days following eye injections     . NITROGLYCERIN 0.4 MG SL SUBL Sublingual Place 0.4 mg under the tongue every 5 (five) minutes as needed. As needed for chest pain      BP 145/56  Pulse 120  Temp(Src) 98.3 F (36.8 C) (Oral)  Resp 25  SpO2 100%  Physical Exam  Nursing note and vitals reviewed. Constitutional: She is oriented to person, place, and time. She appears well-developed and well-nourished.       Uncomfortable in appearance  HENT:  Head: Normocephalic and atraumatic.  Mouth/Throat: Oropharynx is clear and moist.  Eyes: Conjunctivae and EOM are normal. Pupils are equal, round, and reactive to light.  Neck: Normal range of motion. Neck supple.  Cardiovascular: Normal heart sounds and intact distal pulses.  An irregularly irregular rhythm present. Tachycardia present.  Exam reveals no gallop and no friction rub.   No murmur heard. Pulmonary/Chest: Effort normal and breath sounds normal. No respiratory distress. She has no wheezes. She has no rales. She exhibits no tenderness.    Abdominal: Soft. Bowel sounds are normal. There is no tenderness.  Musculoskeletal: Normal range of motion. She exhibits no edema and no tenderness.  Neurological: She is alert and oriented to person, place, and time. No cranial nerve deficit.  Skin: Skin is warm and dry. No rash noted.    ED Course  Procedures (including critical care time)  CRITICAL CARE Performed by: Dayton Bailiff   Total critical care time: 32 min  Critical care time was exclusive of separately billable procedures and treating other patients.  Critical care was necessary to treat or prevent imminent or life-threatening deterioration.  Critical care was time spent personally by me on the following activities: development of treatment plan with patient and/or surrogate as well as nursing, discussions with consultants, evaluation of patient's response to treatment, examination of patient, obtaining history from patient or surrogate, ordering and performing treatments and interventions, ordering and review of laboratory studies, ordering and review of radiographic studies, pulse oximetry and re-evaluation of patient's condition.   Date: 08/13/2011  Rate: 178  Rhythm: atrial fibrillation  QRS Axis: left  Intervals: normal  ST/T Wave abnormalities: nonspecific ST/T changes  Conduction Disutrbances:left anterior fascicular block  Narrative Interpretation:   Old EKG Reviewed: changes noted  Labs Reviewed  CBC - Abnormal; Notable for the following:    WBC 13.2 (*)    RBC 5.34 (*)    Hemoglobin 15.8 (*)    MCHC 36.2 (*)    All other components within normal limits  DIFFERENTIAL - Abnormal; Notable for the following:    Neutro Abs 9.9 (*)    Monocytes Absolute 1.2 (*)    All other components within normal limits  BASIC METABOLIC PANEL - Abnormal; Notable for the following:    Sodium 133 (*)    Chloride 90 (*)    Glucose, Bld 411 (*)    Calcium 11.1 (*)    GFR calc non Af Amer 82 (*)    All other components  within normal limits  PROTIME-INR - Abnormal; Notable for the following:    Prothrombin Time 23.6 (*)    INR 2.06 (*)    All other components within normal limits  APTT - Abnormal; Notable for the following:    aPTT 38 (*)    All other components within normal limits  PRO B NATRIURETIC PEPTIDE  POCT I-STAT TROPONIN I   Dg Chest Port 1 View  08/13/2011  *RADIOLOGY REPORT*  Clinical Data: Shortness of breath, pain  PORTABLE CHEST - 1 VIEW  Comparison: Chest x-ray of 05/20/2011  Findings: No active infiltrate or effusion is seen.  There is cardiomegaly present which is stable.  No acute bony abnormality is noted.  IMPRESSION: Stable cardiomegaly.  No active lung disease.  Original Report Authenticated By: Juline Patch, M.D.     1. Atrial fibrillation with RVR   2. Chest pain       MDM  New onset atrial fibrillation with rapid ventricular response which the patient has a history of in the past. She is placed on a diltiazem drip following a bolus with some improvement of her heart rate. Blood work was obtained including CBC, BMP, coags, troponin, BNP. Chest x-ray was also obtained. I did not anticoagulate her at this time as it has been approximately 24 hours. She has a history of seeing cardiology therefore I counseled them for admission. She'll be admitted to the [facility] cardiology service        Dayton Bailiff, MD 08/13/11 [phone]

## 2011-08-13 NOTE — ED Notes (Signed)
 Cardiology is at the bedside.

## 2011-08-14 ENCOUNTER — Other Ambulatory Visit: Payer: Self-pay

## 2011-08-14 DIAGNOSIS — R079 Chest pain, unspecified: Secondary | ICD-10-CM

## 2011-08-14 LAB — CBC
HCT: 38.7 % (ref 36.0–46.0)
Hemoglobin: 13.1 g/dL (ref 12.0–15.0)
MCH: 28.6 pg (ref 26.0–34.0)
MCHC: 33.9 g/dL (ref 30.0–36.0)
MCV: 84.5 fL (ref 78.0–100.0)
RDW: 14.2 % (ref 11.5–15.5)

## 2011-08-14 LAB — BASIC METABOLIC PANEL
BUN: 14 mg/dL (ref 6–23)
Creatinine, Ser: 0.63 mg/dL (ref 0.50–1.10)
GFR calc Af Amer: >90 mL/min (ref >90)
GFR calc non Af Amer: 83 mL/min — ABNORMAL LOW (ref >90)
Glucose, Bld: 331 mg/dL — ABNORMAL HIGH (ref 70–99)
Potassium: 3.9 mEq/L (ref 3.5–5.1)

## 2011-08-14 LAB — CARDIAC PANEL(CRET KIN+CKTOT+MB+TROPI)
CK, MB: 9.4 ng/mL (ref 0.3–4.0)
Relative Index: INVALID (ref 0.0–2.5)
Total CK: 95 U/L (ref 7–177)
Troponin I: 8.72 ng/mL (ref <0.30)

## 2011-08-14 LAB — PROTIME-INR: Prothrombin Time: 25.7 seconds — ABNORMAL HIGH (ref 11.6–15.2)

## 2011-08-14 LAB — GLUCOSE, CAPILLARY
Glucose-Capillary: 254 mg/dL — ABNORMAL HIGH (ref 70–99)
Glucose-Capillary: 211 mg/dL — ABNORMAL HIGH (ref 70–99)

## 2011-08-14 MED ORDER — DILTIAZEM HCL ER COATED BEADS 300 MG PO CP24
300.0000 mg | ORAL_CAPSULE | Freq: Every day | ORAL | Status: DC
  Administered 2011-08-14 – 2011-08-16 (×3): 300 mg via ORAL
  Filled 2011-08-14 (×6): qty 1

## 2011-08-14 NOTE — Progress Notes (Signed)
ANTICOAGULATION CONSULT NOTE - Follow Up  Pharmacy Consult for  Heparin/Coumadin (now on hold) Indication:  Afib and Positive cardiac enzymes  Allergies  Allergen Reactions  . Ace Inhibitors Cough    Enalapril  benicor    . Crestor (Rosuvastatin Calcium)     Fatigue and leg pain   . Lipitor (Atorvastatin Calcium)     Myalgias   . Niaspan (Niacin)     Intol   . Olmesartan   . Prednisone     REACTION: "retained fluid"    Patient Measurements: Height: 5\' 4"  (162.6 cm) Weight: 157 lb 10.1 oz (71.5 kg) IBW/kg (Calculated) : 54.7   Vital Signs: Temp: 97.5 F (36.4 C) (02/16 1317) Temp src: Oral (02/16 1317) BP: 130/69 mmHg (02/16 1317) Pulse Rate: 60  (02/16 1317)  Labs:  Basename 08/14/11 0600 08/14/11 0500 08/14/11 0329 08/13/11 2056 08/13/11 1551 08/13/11 1215  HGB -- 13.1 -- -- -- 15.8*  HCT -- 38.7 -- -- -- 43.7  PLT -- 189 -- -- -- 221  APTT -- -- -- -- -- 38*  LABPROT 25.7* -- -- 24.5* -- 23.6*  INR 2.30* -- -- 2.16* -- 2.06*  HEPARINUNFRC -- -- -- -- -- --  CREATININE -- 0.63 -- -- 0.67 0.65  CKTOTAL -- -- 95 166 132 --  CKMB -- -- 9.4* 15.5* 14.3* --  TROPONINI -- -- 8.72* 8.76* 2.43* --   Estimated Creatinine Clearance: 55.3 ml/min (by C-G formula based on Cr of 0.63).  Medical History: Past Medical History  Diagnosis Date  . Hypertension     with h/o hypertensive urgency  . Glaucoma   . Tremor, essential   . Diabetes mellitus with neuropathy   . B12 deficiency   . Hyperlipidemia   . Atrial fibrillation     a. on chronic coumadin  . Chronic diastolic heart failure     echo 05/2009: mild LVH, EF 55-60%, grade 2 diast dysfxn, mild LAE, PASP  . CAD (coronary artery disease)     a.  LHC 06/2009: pLAD 40%, prox-mid D1 80% (small);  CFX 40-50%, mRCA 20%, EF 65%  -  med Rx;  b.  05/23/11 Myoview - EF 53%, no isch/inf  . Carotid stenosis     dopplers 6/12: 1-39% bilat ICA  . Pulmonary fibrosis     Dr. Vassie Loll;  patient taken off O2 in 8/12; dyspnea felt  CHF>>ILD  . Skin cancer   . Shortness of breath     Medications:  Prescriptions prior to admission  Medication Sig Dispense Refill  . aspirin 81 MG tablet Take 324 mg by mouth daily.      . Cholecalciferol (VITAMIN D) 1000 UNITS capsule Take 1,000 Units by mouth daily. OTC        . colesevelam (WELCHOL) 625 MG tablet Take 1,250 mg by mouth daily.        . digoxin (LANOXIN) 0.125 MG tablet Take 125 mcg by mouth daily.      Marland Kitchen diltiazem (TIAZAC) 420 MG 24 hr capsule Take 420 mg by mouth daily.      . fish oil-omega-3 fatty acids 1000 MG capsule Take 1 g by mouth daily.      . furosemide (LASIX) 40 MG tablet Take 80 mg by mouth 2 (two) times daily.      Marland Kitchen glimepiride (AMARYL) 4 MG tablet Take 1 tablet by mouth Twice daily.      . isosorbide mononitrate (IMDUR) 30 MG 24 hr tablet Take 30 mg by  mouth daily.      Marland Kitchen latanoprost (XALATAN) 0.005 % ophthalmic solution 1 drop at bedtime.        . metFORMIN (GLUCOPHAGE) 1000 MG tablet Take 1,000 mg by mouth 2 (two) times daily with a meal.      . metoprolol (LOPRESSOR) 50 MG tablet Take 50 mg by mouth 2 (two) times daily.      Marland Kitchen NOVOLOG MIX 70/30 FLEXPEN (70-30) 100 UNIT/ML injection Inject 20-30 Units into the skin Twice daily. 30 units in the morning & 20 units in the evening      . potassium chloride (K-DUR,KLOR-CON) 10 MEQ tablet Take 10 mEq by mouth daily.      Marland Kitchen warfarin (COUMADIN) 5 MG tablet Take 5-10 mg by mouth daily. Alternates days and takes 10 mg on Tuesdays, Thursdays & Saturday.  & 5 mg other days of the week.      Marland Kitchen Besifloxacin HCl (BESIVANCE) 0.6 % SUSP Place 1 drop into both eyes 4 (four) times daily as needed. For 2 days following eye injections       . nitroGLYCERIN (NITROSTAT) 0.4 MG SL tablet Place 0.4 mg under the tongue every 5 (five) minutes as needed. As needed for chest pain        Assessment: 81 yof on chronic coumadin for atrial fibrillation. Pt admitted with CP, SOB and found to be in rapid afib. INR up slightly despite  no coumadin. Coumadin on hold for cath. Heparin when INR <2  Goal of Therapy:  Heparin Level - Xa 0.3-0.7   Plan:  1.  INR in AM 2.  Heparin when INR <2

## 2011-08-14 NOTE — Progress Notes (Signed)
Subjective:  Patient has converted to NSR on IV Cardizem. Cardiac enzymes are elevated consistent with NSTEMI. No further chest pain.  EKG today shows resolution of the severe ST depression present on admission.  Objective:  Vital Signs in the last 24 hours: Temp:  [97.2 F (36.2 C)-98.5 F (36.9 C)] 97.4 F (36.3 C) (02/16 0402) Pulse Rate:  [61-128] 70  (02/16 1000) Resp:  [18-25] 20  (02/16 0402) BP: (127-161)/(49-73) 140/72 mmHg (02/16 1000) SpO2:  [97 %-100 %] 99 % (02/16 0402) Weight:  [153 lb 3.5 oz (69.5 kg)-157 lb 10.1 oz (71.5 kg)] 157 lb 10.1 oz (71.5 kg) (02/16 0402)  Intake/Output from previous day: 02/15 0701 - 02/16 0700 In: 483 [P.O.:480; I.V.:3] Out: 200 [Urine:200] Intake/Output from this shift: Total I/O In: -  Out: 200 [Urine:200]     . aspirin EC  81 mg Oral Daily  . cholecalciferol  1,000 Units Oral Daily  . colesevelam  1,250 mg Oral Q breakfast  . digoxin  125 mcg Oral Daily  . diltiazem  10 mg Intravenous Once  . furosemide  80 mg Oral BID  . glimepiride  4 mg Oral Q breakfast  . insulin aspart  0-15 Units Subcutaneous TID WC  . insulin aspart protamine-insulin aspart  20 Units Subcutaneous Q supper  . insulin aspart protamine-insulin aspart  30 Units Subcutaneous Q breakfast  . latanoprost  1 drop Both Eyes QHS  . metoprolol  50 mg Oral BID  . omega-3 acid ethyl esters  1 g Oral Daily  . potassium chloride  10 mEq Oral Daily  . potassium chloride  40 mEq Oral Once  . sodium chloride  3 mL Intravenous Q12H  . DISCONTD: aspirin  81 mg Oral Daily  . DISCONTD: fish oil-omega-3 fatty acids  1 g Oral Daily  . DISCONTD: Vitamin D  1,000 Units Oral Daily  . DISCONTD: warfarin  5 mg Oral ONCE-1800      . diltiazem (CARDIZEM) infusion 20 mg/hr (08/14/11 1035)  . nitroGLYCERIN    . DISCONTD: diltiazem (CARDIZEM) infusion 5 mg/hr (08/13/11 1227)    Physical Exam: The patient appears to be in no distress.  Head and neck exam reveals that the  pupils are equal and reactive.  The extraocular movements are full.  There is no scleral icterus.  Mouth and pharynx are benign.  No lymphadenopathy.  No carotid bruits.  The jugular venous pressure is normal.  Thyroid is not enlarged or tender.  Chest is clear to percussion and auscultation.  No rales or rhonchi.  Expansion of the chest is symmetrical.  Heart reveals no abnormal lift or heave.  First and second heart sounds are normal.  There is no  gallop rub or click. GR 2/6 systolic murmur.  The abdomen is soft and nontender.  Bowel sounds are normoactive.  There is no hepatosplenomegaly or mass.  There are no abdominal bruits.  Extremities reveal no phlebitis or edema.  Pedal pulses are good.  There is no cyanosis or clubbing.  Neurologic exam is normal strength and no lateralizing weakness.  No sensory deficits.  Integument reveals no rash  Lab Results:  Basename 08/14/11 0500 08/13/11 1215  WBC 7.3 13.2*  HGB 13.1 15.8*  PLT 189 221    Basename 08/14/11 0500 08/13/11 1551  NA 133* 131*  K 3.9 3.2*  CL 95* 91*  CO2 30 29  GLUCOSE 331* 355*  BUN 14 16  CREATININE 0.63 0.67    Basename 08/14/11 0329  08/13/11 2056  TROPONINI 8.72* 8.76*   Hepatic Function Panel No results found for this basename: PROT,ALBUMIN,AST,ALT,ALKPHOS,BILITOT,BILIDIR,IBILI in the last 72 hours No results found for this basename: CHOL in the last 72 hours No results found for this basename: PROTIME in the last 72 hours  Imaging: Imaging results have been reviewed  Cardiac Studies:  Assessment/Plan:  Patient Active Hospital Problem List: DIABETES MELLITUS (07/28/2007)   Assessment: blood sugars running high   Plan: SSI HYPERLIPIDEMIA (07/28/2007)    Plan: Check fasting lipids HYPERTENSION (07/28/2007)   Assessment:Normotensive   Plan:Contine current Rx CAD (06/16/2009)   Assessment: Enzymes confirm NSTEMI   Plan: Hold warfarin.  Plan cath Monday if Pro time down. Atrial fibrillation  (09/05/2009)   Assessment: Back in NSR   Plan: Convert to oral diltiazem at new lower dose PULMONARY FIBROSIS (07/18/2009)   Assessment: No dyspnea at rest on O2   Plan: Continue current Rx Acute on chronic diastolic heart failure (05/20/2011)   Assessment: No current Sx   Plan: Continue current Rx   LOS: 1 day    Cassell Clement 08/14/2011, 12:33 PM

## 2011-08-15 ENCOUNTER — Other Ambulatory Visit: Payer: Self-pay

## 2011-08-15 LAB — GLUCOSE, CAPILLARY
Glucose-Capillary: 203 mg/dL — ABNORMAL HIGH (ref 70–99)
Glucose-Capillary: 246 mg/dL — ABNORMAL HIGH (ref 70–99)

## 2011-08-15 LAB — PROTIME-INR: Prothrombin Time: 23.8 seconds — ABNORMAL HIGH (ref 11.6–15.2)

## 2011-08-15 LAB — LIPID PANEL
LDL Cholesterol: 127 mg/dL — ABNORMAL HIGH (ref 0–99)
VLDL: 40 mg/dL (ref 0–40)

## 2011-08-15 MED ORDER — HEPARIN SOD (PORCINE) IN D5W 100 UNIT/ML IV SOLN
950.0000 [IU]/h | INTRAVENOUS | Status: DC
  Administered 2011-08-15: 950 [IU]/h via INTRAVENOUS
  Filled 2011-08-15: qty 250

## 2011-08-15 MED ORDER — SODIUM CHLORIDE 0.9 % IV SOLN: 250.0000 mL | INTRAVENOUS | Status: DC | PRN

## 2011-08-15 MED ORDER — SODIUM CHLORIDE 0.9 % IJ SOLN
3.0000 mL | Freq: Two times a day (BID) | INTRAMUSCULAR | Status: DC
  Administered 2011-08-15 – 2011-08-16 (×2): 3 mL via INTRAVENOUS

## 2011-08-15 MED ORDER — SODIUM CHLORIDE 0.9 % IJ SOLN: 3.0000 mL | INTRAMUSCULAR | Status: DC | PRN

## 2011-08-15 MED ORDER — HEPARIN SOD (PORCINE) IN D5W 100 UNIT/ML IV SOLN
1300.0000 [IU]/h | INTRAVENOUS | Status: DC
  Filled 2011-08-15 (×2): qty 250

## 2011-08-15 MED ORDER — ASPIRIN 81 MG PO CHEW
324.0000 mg | CHEWABLE_TABLET | ORAL | Status: AC
  Administered 2011-08-16: 324 mg via ORAL
  Filled 2011-08-15: qty 4

## 2011-08-15 MED ORDER — SODIUM CHLORIDE 0.9 % IV SOLN
1.0000 mL/kg/h | INTRAVENOUS | Status: DC
  Administered 2011-08-16: 1 mL/kg/h via INTRAVENOUS

## 2011-08-15 NOTE — Progress Notes (Signed)
ANTICOAGULATION CONSULT NOTE - Follow Up Consult  Pharmacy Consult for Heparin, Coumadin Indication: atrial fibrillation and + cardiac enzymes  Allergies  Allergen Reactions  . Ace Inhibitors Cough    Enalapril  benicor    . Crestor (Rosuvastatin Calcium)     Fatigue and leg pain   . Lipitor (Atorvastatin Calcium)     Myalgias   . Niaspan (Niacin)     Intol   . Olmesartan   . Prednisone     REACTION: "retained fluid"    Patient Measurements: Height: 5\' 4"  (162.6 cm) Weight: 155 lb 10.3 oz (70.6 kg) IBW/kg (Calculated) : 54.7    Vital Signs: Temp: 97.6 F (36.4 C) (02/17 2005) Temp src: Oral (02/17 2005) BP: 159/77 mmHg (02/17 2005) Pulse Rate: 75  (02/17 2005)  Labs:  Basename 08/15/11 1934 08/15/11 1924 08/15/11 0500 08/14/11 0600 08/14/11 0500 08/14/11 0329 08/13/11 2056 08/13/11 1551 08/13/11 1215  HGB -- -- -- -- 13.1 -- -- -- 15.8*  HCT -- -- -- -- 38.7 -- -- -- 43.7  PLT -- -- -- -- 189 -- -- -- 221  APTT -- -- -- -- -- -- -- -- 38*  LABPROT 20.3* -- 23.8* 25.7* -- -- -- -- --  INR 1.70* -- 2.09* 2.30* -- -- -- -- --  HEPARINUNFRC -- <0.10* -- -- -- -- -- -- --  CREATININE -- -- -- -- 0.63 -- -- 0.67 0.65  CKTOTAL -- -- -- -- -- 95 166 132 --  CKMB -- -- -- -- -- 9.4* 15.5* 14.3* --  TROPONINI -- -- -- -- -- 8.72* 8.76* 2.43* --   Estimated Creatinine Clearance: 55 ml/min (by C-G formula based on Cr of 0.63).   Medications:  Prescriptions prior to admission  Medication Sig Dispense Refill  . aspirin 81 MG tablet Take 324 mg by mouth daily.      . Cholecalciferol (VITAMIN D) 1000 UNITS capsule Take 1,000 Units by mouth daily. OTC        . colesevelam (WELCHOL) 625 MG tablet Take 1,250 mg by mouth daily.        . digoxin (LANOXIN) 0.125 MG tablet Take 125 mcg by mouth daily.      Marland Kitchen diltiazem (TIAZAC) 420 MG 24 hr capsule Take 420 mg by mouth daily.      . fish oil-omega-3 fatty acids 1000 MG capsule Take 1 g by mouth daily.      . furosemide (LASIX)  40 MG tablet Take 80 mg by mouth 2 (two) times daily.      Marland Kitchen glimepiride (AMARYL) 4 MG tablet Take 1 tablet by mouth Twice daily.      . isosorbide mononitrate (IMDUR) 30 MG 24 hr tablet Take 30 mg by mouth daily.      Marland Kitchen latanoprost (XALATAN) 0.005 % ophthalmic solution 1 drop at bedtime.        . metFORMIN (GLUCOPHAGE) 1000 MG tablet Take 1,000 mg by mouth 2 (two) times daily with a meal.      . metoprolol (LOPRESSOR) 50 MG tablet Take 50 mg by mouth 2 (two) times daily.      Marland Kitchen NOVOLOG MIX 70/30 FLEXPEN (70-30) 100 UNIT/ML injection Inject 20-30 Units into the skin Twice daily. 30 units in the morning & 20 units in the evening      . potassium chloride (K-DUR,KLOR-CON) 10 MEQ tablet Take 10 mEq by mouth daily.      Marland Kitchen warfarin (COUMADIN) 5 MG tablet Take 5-10 mg by  mouth daily. Alternates days and takes 10 mg on Tuesdays, Thursdays & Saturday.  & 5 mg other days of the week.      Marland Kitchen Besifloxacin HCl (BESIVANCE) 0.6 % SUSP Place 1 drop into both eyes 4 (four) times daily as needed. For 2 days following eye injections       . nitroGLYCERIN (NITROSTAT) 0.4 MG SL tablet Place 0.4 mg under the tongue every 5 (five) minutes as needed. As needed for chest pain       Scheduled:     . aspirin  324 mg Oral Pre-Cath  . aspirin EC  81 mg Oral Daily  . cholecalciferol  1,000 Units Oral Daily  . colesevelam  1,250 mg Oral Q breakfast  . digoxin  125 mcg Oral Daily  . diltiazem  300 mg Oral Daily  . furosemide  80 mg Oral BID  . glimepiride  4 mg Oral Q breakfast  . insulin aspart  0-15 Units Subcutaneous TID WC  . insulin aspart protamine-insulin aspart  20 Units Subcutaneous Q supper  . insulin aspart protamine-insulin aspart  30 Units Subcutaneous Q breakfast  . latanoprost  1 drop Both Eyes QHS  . metoprolol  50 mg Oral BID  . omega-3 acid ethyl esters  1 g Oral Daily  . potassium chloride  10 mEq Oral Daily  . sodium chloride  3 mL Intravenous Q12H  . sodium chloride  3 mL Intravenous Q12H    Infusions:     . sodium chloride    . heparin 950 Units/hr (08/15/11 1249)    Assessment:  81 yof on chronic coumadin for atrial fibrillation. Pt admitted with CP, SOB and found to be in rapid afib.   Cardiac enzymes +. Coumadin on hold for cardiac cath.  INR down 2.3 >> 2.09, Heparin to begin when INR < 2.  With dropping INR, heparin started at 950 units/hr.    Heparin level subtherapeutic on this rate, spoke to RN, no issues with IV.  Goal of Therapy:   Heparin level 0.3-0.7 units/ml   Plan:   Increase IV heparin to 1100 units/hr.  Check heparin level in 8 hrs.  Lillias Difrancesco, Gwenlyn Found, Pharm.D. 08/15/2011 9:14 PM

## 2011-08-15 NOTE — Progress Notes (Signed)
ANTICOAGULATION CONSULT NOTE - Follow Up Consult  Pharmacy Consult for Heparin, Coumadin Indication: atrial fibrillation and + cardiac enzymes  Allergies  Allergen Reactions  . Ace Inhibitors Cough    Enalapril  benicor    . Crestor (Rosuvastatin Calcium)     Fatigue and leg pain   . Lipitor (Atorvastatin Calcium)     Myalgias   . Niaspan (Niacin)     Intol   . Olmesartan   . Prednisone     REACTION: "retained fluid"    Patient Measurements: Height: 5\' 4"  (162.6 cm) Weight: 155 lb 10.3 oz (70.6 kg) IBW/kg (Calculated) : 54.7    Vital Signs: Temp: 98.4 F (36.9 C) (02/17 0435) Temp src: Oral (02/17 0435) BP: 145/66 mmHg (02/17 1027) Pulse Rate: 58  (02/17 1028)  Labs:  Basename 08/15/11 0500 08/14/11 0600 08/14/11 0500 08/14/11 0329 08/13/11 2056 08/13/11 1551 08/13/11 1215  HGB -- -- 13.1 -- -- -- 15.8*  HCT -- -- 38.7 -- -- -- 43.7  PLT -- -- 189 -- -- -- 221  APTT -- -- -- -- -- -- 38*  LABPROT 23.8* 25.7* -- -- 24.5* -- --  INR 2.09* 2.30* -- -- 2.16* -- --  HEPARINUNFRC -- -- -- -- -- -- --  CREATININE -- -- 0.63 -- -- 0.67 0.65  CKTOTAL -- -- -- 95 166 132 --  CKMB -- -- -- 9.4* 15.5* 14.3* --  TROPONINI -- -- -- 8.72* 8.76* 2.43* --   Estimated Creatinine Clearance: 55 ml/min (by C-G formula based on Cr of 0.63).   Medications:  Prescriptions prior to admission  Medication Sig Dispense Refill  . aspirin 81 MG tablet Take 324 mg by mouth daily.      . Cholecalciferol (VITAMIN D) 1000 UNITS capsule Take 1,000 Units by mouth daily. OTC        . colesevelam (WELCHOL) 625 MG tablet Take 1,250 mg by mouth daily.        . digoxin (LANOXIN) 0.125 MG tablet Take 125 mcg by mouth daily.      Marland Kitchen diltiazem (TIAZAC) 420 MG 24 hr capsule Take 420 mg by mouth daily.      . fish oil-omega-3 fatty acids 1000 MG capsule Take 1 g by mouth daily.      . furosemide (LASIX) 40 MG tablet Take 80 mg by mouth 2 (two) times daily.      Marland Kitchen glimepiride (AMARYL) 4 MG tablet  Take 1 tablet by mouth Twice daily.      . isosorbide mononitrate (IMDUR) 30 MG 24 hr tablet Take 30 mg by mouth daily.      Marland Kitchen latanoprost (XALATAN) 0.005 % ophthalmic solution 1 drop at bedtime.        . metFORMIN (GLUCOPHAGE) 1000 MG tablet Take 1,000 mg by mouth 2 (two) times daily with a meal.      . metoprolol (LOPRESSOR) 50 MG tablet Take 50 mg by mouth 2 (two) times daily.      Marland Kitchen NOVOLOG MIX 70/30 FLEXPEN (70-30) 100 UNIT/ML injection Inject 20-30 Units into the skin Twice daily. 30 units in the morning & 20 units in the evening      . potassium chloride (K-DUR,KLOR-CON) 10 MEQ tablet Take 10 mEq by mouth daily.      Marland Kitchen warfarin (COUMADIN) 5 MG tablet Take 5-10 mg by mouth daily. Alternates days and takes 10 mg on Tuesdays, Thursdays & Saturday.  & 5 mg other days of the week.      Marland Kitchen  Besifloxacin HCl (BESIVANCE) 0.6 % SUSP Place 1 drop into both eyes 4 (four) times daily as needed. For 2 days following eye injections       . nitroGLYCERIN (NITROSTAT) 0.4 MG SL tablet Place 0.4 mg under the tongue every 5 (five) minutes as needed. As needed for chest pain       Scheduled:    . aspirin EC  81 mg Oral Daily  . cholecalciferol  1,000 Units Oral Daily  . colesevelam  1,250 mg Oral Q breakfast  . digoxin  125 mcg Oral Daily  . diltiazem  300 mg Oral Daily  . furosemide  80 mg Oral BID  . glimepiride  4 mg Oral Q breakfast  . insulin aspart  0-15 Units Subcutaneous TID WC  . insulin aspart protamine-insulin aspart  20 Units Subcutaneous Q supper  . insulin aspart protamine-insulin aspart  30 Units Subcutaneous Q breakfast  . latanoprost  1 drop Both Eyes QHS  . metoprolol  50 mg Oral BID  . omega-3 acid ethyl esters  1 g Oral Daily  . potassium chloride  10 mEq Oral Daily  . sodium chloride  3 mL Intravenous Q12H   Infusions:    . DISCONTD: diltiazem (CARDIZEM) infusion 20 mg/hr (08/14/11 1035)  . DISCONTD: nitroGLYCERIN      Assessment:  81 yof on chronic coumadin for atrial  fibrillation. Pt admitted with CP, SOB and found to be in rapid afib.   Cardiac enzymes +. Coumadin on hold for cardiac cath.  INR down 2.3 >> 2.09, Heparin to begin when INR < 2.  With dropping INR, will begin Heparin now.  Goal of Therapy:   Heparin level 0.3-0.7 units/ml   Plan:   Begin Heparin, no load, at 950 units/hr.  Hold Coumadin.  Heparin level, INR in 8 hours.  Lenox Bink, Elisha Headland, Pharm.D. 08/15/2011 10:39 AM

## 2011-08-15 NOTE — Progress Notes (Signed)
 Subjective:  No chest pain.  EKG shows low atrial rhythm.  Objective:  Vital Signs in the last 24 hours: Temp:  [97 F (36.1 C)-98.4 F (36.9 C)] 98.4 F (36.9 C) (02/17 0435) Pulse Rate:  [58-70] 65  (02/17 0435) Resp:  [16-18] 18  (02/17 0435) BP: (130-161)/(59-75) 161/75 mmHg (02/17 0435) SpO2:  [95 %-99 %] 98 % (02/17 0435) Weight:  [155 lb 10.3 oz (70.6 kg)] 155 lb 10.3 oz (70.6 kg) (02/17 0546)  Intake/Output from previous day: 02/16 0701 - 02/17 0700 In: 240 [P.O.:240] Out: 1800 [Urine:1800] Intake/Output from this shift:       . aspirin EC  81 mg Oral Daily  . cholecalciferol  1,000 Units Oral Daily  . colesevelam  1,250 mg Oral Q breakfast  . digoxin  125 mcg Oral Daily  . diltiazem  300 mg Oral Daily  . furosemide  80 mg Oral BID  . glimepiride  4 mg Oral Q breakfast  . insulin aspart  0-15 Units Subcutaneous TID WC  . insulin aspart protamine-insulin aspart  20 Units Subcutaneous Q supper  . insulin aspart protamine-insulin aspart  30 Units Subcutaneous Q breakfast  . latanoprost  1 drop Both Eyes QHS  . metoprolol  50 mg Oral BID  . omega-3 acid ethyl esters  1 g Oral Daily  . potassium chloride  10 mEq Oral Daily  . sodium chloride  3 mL Intravenous Q12H      . DISCONTD: diltiazem (CARDIZEM) infusion 20 mg/hr (08/14/11 1035)  . DISCONTD: nitroGLYCERIN      Physical Exam: The patient appears to be in no distress.  Head and neck exam reveals that the pupils are equal and reactive.  The extraocular movements are full.  There is no scleral icterus.  Mouth and pharynx are benign.  No lymphadenopathy.  No carotid bruits.  The jugular venous pressure is normal.  Thyroid is not enlarged or tender.  Chest is clear to percussion and auscultation.  No rales or rhonchi.  Expansion of the chest is symmetrical.  Heart reveals no abnormal lift or heave.  First and second heart sounds are normal.  There is no  gallop rub or click. G 2/6 systolic murmur at  base.  The abdomen is soft and nontender.  Bowel sounds are normoactive.  There is no hepatosplenomegaly or mass.  There are no abdominal bruits.  Extremities reveal no phlebitis or edema.  Pedal pulses are good.  There is no cyanosis or clubbing.  Neurologic exam is normal strength and no lateralizing weakness.  No sensory deficits.  Integument reveals no rash  Lab Results:  Basename 08/14/11 0500 08/13/11 1215  WBC 7.3 13.2*  HGB 13.1 15.8*  PLT 189 221    Basename 08/14/11 0500 08/13/11 1551  NA 133* 131*  K 3.9 3.2*  CL 95* 91*  CO2 30 29  GLUCOSE 331* 355*  BUN 14 16  CREATININE 0.63 0.67    Basename 08/14/11 0329 08/13/11 2056  TROPONINI 8.72* 8.76*   Hepatic Function Panel No results found for this basename: PROT,ALBUMIN,AST,ALT,ALKPHOS,BILITOT,BILIDIR,IBILI in the last 72 hours  Basename 08/15/11 0500  CHOL 205*   No results found for this basename: PROTIME in the last 72 hours  Imaging: Imaging results have been reviewed  Cardiac Studies:  Assessment/Plan:  Patient Active Hospital Problem List: DIABETES MELLITUS (07/28/2007)   Assessment: Sugars improving   Plan:Continue sliding scale  HYPERTENSION (07/28/2007)   Assessment: Improving  CAD (06/16/2009)   Assessment:Enzymes elevated     Plan:Cath Monday if INR okay.  INR 2.09 this am  Atrial fibrillation (09/05/2009)   Assessment: Maintaining NSR   Plan: Continue digoxin, Diltiazem  Acute on chronic diastolic heart failure (05/20/2011)   Assessment: No dyspnea.     LOS: 2 days    Jamyrah Saur 08/15/2011, 8:24 AM    

## 2011-08-16 ENCOUNTER — Other Ambulatory Visit: Payer: Self-pay

## 2011-08-16 ENCOUNTER — Encounter (HOSPITAL_COMMUNITY)
Admission: EM | Disposition: A | Discharge status: Home / Self Care | Payer: Self-pay | Source: Home / Self Care | Attending: Cardiology

## 2011-08-16 DIAGNOSIS — I2510 Atherosclerotic heart disease of native coronary artery without angina pectoris: Secondary | ICD-10-CM

## 2011-08-16 DIAGNOSIS — I214 Non-ST elevation (NSTEMI) myocardial infarction: Secondary | ICD-10-CM

## 2011-08-16 HISTORY — PX: LEFT HEART CATHETERIZATION WITH CORONARY ANGIOGRAM: SHX5451

## 2011-08-16 HISTORY — PX: PERCUTANEOUS CORONARY STENT INTERVENTION (PCI-S): SHX5485

## 2011-08-16 LAB — CBC
MCH: 29.5 pg (ref 26.0–34.0)
Platelets: 207 10*3/uL (ref 150–400)
RBC: 4.71 MIL/uL (ref 3.87–5.11)
WBC: 8.5 10*3/uL (ref 4.0–10.5)

## 2011-08-16 LAB — PROTIME-INR: Prothrombin Time: 17.2 seconds — ABNORMAL HIGH (ref 11.6–15.2)

## 2011-08-16 LAB — GLUCOSE, CAPILLARY
Glucose-Capillary: 210 mg/dL — ABNORMAL HIGH (ref 70–99)
Glucose-Capillary: 195 mg/dL — ABNORMAL HIGH (ref 70–99)

## 2011-08-16 LAB — POCT ACTIVATED CLOTTING TIME: Activated Clotting Time: 518 seconds

## 2011-08-16 SURGERY — LEFT HEART CATHETERIZATION WITH CORONARY ANGIOGRAM
Anesthesia: LOCAL | Site: Hand | Laterality: Right

## 2011-08-16 MED ORDER — BIVALIRUDIN 250 MG IV SOLR
INTRAVENOUS | Status: AC
  Filled 2011-08-16: qty 250

## 2011-08-16 MED ORDER — HEPARIN (PORCINE) IN NACL 2-0.9 UNIT/ML-% IJ SOLN
INTRAMUSCULAR | Status: AC
  Filled 2011-08-16: qty 2000

## 2011-08-16 MED ORDER — CLOPIDOGREL BISULFATE 300 MG PO TABS
ORAL_TABLET | ORAL | Status: AC
  Filled 2011-08-16: qty 2

## 2011-08-16 MED ORDER — SODIUM CHLORIDE 0.9 % IV SOLN
INTRAVENOUS | Status: AC
  Administered 2011-08-16: 15:00:00 via INTRAVENOUS

## 2011-08-16 MED ORDER — FENTANYL CITRATE 0.05 MG/ML IJ SOLN
INTRAMUSCULAR | Status: AC
  Filled 2011-08-16: qty 2

## 2011-08-16 MED ORDER — MIDAZOLAM HCL 2 MG/2ML IJ SOLN
INTRAMUSCULAR | Status: AC
  Filled 2011-08-16: qty 2

## 2011-08-16 MED ORDER — VERAPAMIL HCL 2.5 MG/ML IV SOLN
INTRAVENOUS | Status: AC
  Filled 2011-08-16: qty 2

## 2011-08-16 MED ORDER — LIDOCAINE HCL (PF) 1 % IJ SOLN
INTRAMUSCULAR | Status: AC
  Filled 2011-08-16: qty 30

## 2011-08-16 MED ORDER — HEPARIN SODIUM (PORCINE) 1000 UNIT/ML IJ SOLN
INTRAMUSCULAR | Status: AC
  Filled 2011-08-16: qty 1

## 2011-08-16 MED ORDER — WARFARIN SODIUM 10 MG PO TABS
10.0000 mg | ORAL_TABLET | Freq: Once | ORAL | Status: AC
  Administered 2011-08-16: 10 mg via ORAL
  Filled 2011-08-16: qty 1

## 2011-08-16 NOTE — H&P (View-Only) (Signed)
Subjective:  No chest pain.  EKG shows low atrial rhythm.  Objective:  Vital Signs in the last 24 hours: Temp:  [97 F (36.1 C)-98.4 F (36.9 C)] 98.4 F (36.9 C) (02/17 0435) Pulse Rate:  [58-70] 65  (02/17 0435) Resp:  [16-18] 18  (02/17 0435) BP: (130-161)/(59-75) 161/75 mmHg (02/17 0435) SpO2:  [95 %-99 %] 98 % (02/17 0435) Weight:  [155 lb 10.3 oz (70.6 kg)] 155 lb 10.3 oz (70.6 kg) (02/17 0546)  Intake/Output from previous day: 02/16 0701 - 02/17 0700 In: 240 [P.O.:240] Out: 1800 [Urine:1800] Intake/Output from this shift:       . aspirin EC  81 mg Oral Daily  . cholecalciferol  1,000 Units Oral Daily  . colesevelam  1,250 mg Oral Q breakfast  . digoxin  125 mcg Oral Daily  . diltiazem  300 mg Oral Daily  . furosemide  80 mg Oral BID  . glimepiride  4 mg Oral Q breakfast  . insulin aspart  0-15 Units Subcutaneous TID WC  . insulin aspart protamine-insulin aspart  20 Units Subcutaneous Q supper  . insulin aspart protamine-insulin aspart  30 Units Subcutaneous Q breakfast  . latanoprost  1 drop Both Eyes QHS  . metoprolol  50 mg Oral BID  . omega-3 acid ethyl esters  1 g Oral Daily  . potassium chloride  10 mEq Oral Daily  . sodium chloride  3 mL Intravenous Q12H      . DISCONTD: diltiazem (CARDIZEM) infusion 20 mg/hr (08/14/11 1035)  . DISCONTD: nitroGLYCERIN      Physical Exam: The patient appears to be in no distress.  Head and neck exam reveals that the pupils are equal and reactive.  The extraocular movements are full.  There is no scleral icterus.  Mouth and pharynx are benign.  No lymphadenopathy.  No carotid bruits.  The jugular venous pressure is normal.  Thyroid is not enlarged or tender.  Chest is clear to percussion and auscultation.  No rales or rhonchi.  Expansion of the chest is symmetrical.  Heart reveals no abnormal lift or heave.  First and second heart sounds are normal.  There is no  gallop rub or click. G 2/6 systolic murmur at  base.  The abdomen is soft and nontender.  Bowel sounds are normoactive.  There is no hepatosplenomegaly or mass.  There are no abdominal bruits.  Extremities reveal no phlebitis or edema.  Pedal pulses are good.  There is no cyanosis or clubbing.  Neurologic exam is normal strength and no lateralizing weakness.  No sensory deficits.  Integument reveals no rash  Lab Results:  Basename 08/14/11 0500 08/13/11 1215  WBC 7.3 13.2*  HGB 13.1 15.8*  PLT 189 221    Basename 08/14/11 0500 08/13/11 1551  NA 133* 131*  K 3.9 3.2*  CL 95* 91*  CO2 30 29  GLUCOSE 331* 355*  BUN 14 16  CREATININE 0.63 0.67    Basename 08/14/11 0329 08/13/11 2056  TROPONINI 8.72* 8.76*   Hepatic Function Panel No results found for this basename: PROT,ALBUMIN,AST,ALT,ALKPHOS,BILITOT,BILIDIR,IBILI in the last 72 hours  Basename 08/15/11 0500  CHOL 205*   No results found for this basename: PROTIME in the last 72 hours  Imaging: Imaging results have been reviewed  Cardiac Studies:  Assessment/Plan:  Patient Active Hospital Problem List: DIABETES MELLITUS (07/28/2007)   Assessment: Sugars improving   Plan:Continue sliding scale  HYPERTENSION (07/28/2007)   Assessment: Improving  CAD (06/16/2009)   Assessment:Enzymes elevated  Plan:Cath Monday if INR okay.  INR 2.09 this am  Atrial fibrillation (09/05/2009)   Assessment: Maintaining NSR   Plan: Continue digoxin, Diltiazem  Acute on chronic diastolic heart failure (05/20/2011)   Assessment: No dyspnea.     LOS: 2 days    Cassell Clement 08/15/2011, 8:24 AM

## 2011-08-16 NOTE — Interval H&P Note (Signed)
History and Physical Interval Note:  08/16/2011 12:54 PM  Alexandria [name]  has presented today for cardiac cath with the diagnosis of cp  The various methods of treatment have been discussed with the patient and family. After consideration of risks, benefits and other options for treatment, the patient has consented to  Procedure(s) (LRB): LEFT HEART CATHETERIZATION WITH CORONARY ANGIOGRAM (N/A) as a surgical intervention .  The patients' history has been reviewed, patient examined, no change in status, stable for surgery.  I have reviewed the patients' chart and labs.  Questions were answered to the patient's satisfaction.     Audray Rumore

## 2011-08-16 NOTE — Progress Notes (Signed)
ANTICOAGULATION CONSULT NOTE - Follow Up Consult  Pharmacy Consult for Heparin, Coumadin Indication: atrial fibrillation and + cardiac enzymes  Assessment: 78 yo female on heparin for Afib and + cardiac enzymes while Coumadin on hold for cath. Heparin level is subtherapeutic. No bleeding noted.  Goal of Therapy: Heparin level 0.3-0.7 units/ml   Plan: 1. Increase IV heparin to 1300 units/hr (13 ml/hr) 2. F/U after cath or check heparin level in 8 hrs 3. Daily heparin level and CBC   Allergies  Allergen Reactions  . Ace Inhibitors Cough    Enalapril  benicor    . Crestor (Rosuvastatin Calcium)     Fatigue and leg pain   . Lipitor (Atorvastatin Calcium)     Myalgias   . Niaspan (Niacin)     Intol   . Olmesartan   . Prednisone     REACTION: "retained fluid"    Patient Measurements: Height: 5\' 4"  (162.6 cm) Weight: 153 lb 3.2 oz (69.491 kg) IBW/kg (Calculated) : 54.7    Vital Signs: Temp: 98.7 F (37.1 C) (02/18 0415) Temp src: Oral (02/18 0415) BP: 149/79 mmHg (02/18 0415) Pulse Rate: 67  (02/18 0415)  Labs:  Basename 08/16/11 0715 08/15/11 1934 08/15/11 1924 08/15/11 0500 08/14/11 0500 08/14/11 0329 08/13/11 2056 08/13/11 1551 08/13/11 1215  HGB 13.9 -- -- -- 13.1 -- -- -- --  HCT 38.9 -- -- -- 38.7 -- -- -- 43.7  PLT 207 -- -- -- 189 -- -- -- 221  APTT -- -- -- -- -- -- -- -- 38*  LABPROT 17.2* 20.3* -- 23.8* -- -- -- -- --  INR 1.38 1.70* -- 2.09* -- -- -- -- --  HEPARINUNFRC 0.14* -- <0.10* -- -- -- -- -- --  CREATININE -- -- -- -- 0.63 -- -- 0.67 0.65  CKTOTAL -- -- -- -- -- 95 166 132 --  CKMB -- -- -- -- -- 9.4* 15.5* 14.3* --  TROPONINI -- -- -- -- -- 8.72* 8.76* 2.43* --   Estimated Creatinine Clearance: 54.6 ml/min (by C-G formula based on Cr of 0.63).   Medications:  Scheduled:     . aspirin  324 mg Oral Pre-Cath  . aspirin EC  81 mg Oral Daily  . cholecalciferol  1,000 Units Oral Daily  . colesevelam  1,250 mg Oral Q breakfast  .  digoxin  125 mcg Oral Daily  . diltiazem  300 mg Oral Daily  . furosemide  80 mg Oral BID  . glimepiride  4 mg Oral Q breakfast  . insulin aspart  0-15 Units Subcutaneous TID WC  . insulin aspart protamine-insulin aspart  20 Units Subcutaneous Q supper  . insulin aspart protamine-insulin aspart  30 Units Subcutaneous Q breakfast  . latanoprost  1 drop Both Eyes QHS  . metoprolol  50 mg Oral BID  . omega-3 acid ethyl esters  1 g Oral Daily  . potassium chloride  10 mEq Oral Daily  . sodium chloride  3 mL Intravenous Q12H  . sodium chloride  3 mL Intravenous Q12H   Infusions:     . sodium chloride 1 mL/kg/hr (08/16/11 0627)  . heparin 1,100 Units/hr (08/15/11 2118)  . DISCONTD: heparin 950 Units/hr (08/15/11 1249)    Loura Back, PharmD, BCPS 08/16/2011 8:07 AM

## 2011-08-16 NOTE — Progress Notes (Signed)
UR Completed. Simmons, Anas Reister F [phone]  

## 2011-08-16 NOTE — Progress Notes (Signed)
ANTICOAGULATION CONSULT NOTE - Follow Up Consult  Pharmacy Consult for Coumadin Indication: atrial fibrillation  Allergies  Allergen Reactions  . Ace Inhibitors Cough    Enalapril  benicor    . Crestor (Rosuvastatin Calcium)     Fatigue and leg pain   . Lipitor (Atorvastatin Calcium)     Myalgias   . Niaspan (Niacin)     Intol   . Olmesartan   . Prednisone     REACTION: "retained fluid"    Patient Measurements: Height: 5\' 4"  (162.6 cm) Weight: 153 lb 3.2 oz (69.491 kg) IBW/kg (Calculated) : 54.7    Vital Signs: Temp: 97.1 F (36.2 C) (02/18 1359) Temp src: Oral (02/18 1359) BP: 168/77 mmHg (02/18 1359) Pulse Rate: 18  (02/18 1359)  Labs:  Basename 08/16/11 0715 08/15/11 1934 08/15/11 1924 08/15/11 0500 08/14/11 0500 08/14/11 0329 08/13/11 2056 08/13/11 1551  HGB 13.9 -- -- -- 13.1 -- -- --  HCT 38.9 -- -- -- 38.7 -- -- --  PLT 207 -- -- -- 189 -- -- --  APTT -- -- -- -- -- -- -- --  LABPROT 17.2* 20.3* -- 23.8* -- -- -- --  INR 1.38 1.70* -- 2.09* -- -- -- --  HEPARINUNFRC 0.14* -- <0.10* -- -- -- -- --  CREATININE -- -- -- -- 0.63 -- -- 0.67  CKTOTAL -- -- -- -- -- 95 166 132  CKMB -- -- -- -- -- 9.4* 15.5* 14.3*  TROPONINI -- -- -- -- -- 8.72* 8.76* 2.43*   Estimated Creatinine Clearance: 54.6 ml/min (by C-G formula based on Cr of 0.63).   Medications:  Prescriptions prior to admission  Medication Sig Dispense Refill  . aspirin 81 MG tablet Take 324 mg by mouth daily.      . Cholecalciferol (VITAMIN D) 1000 UNITS capsule Take 1,000 Units by mouth daily. OTC        . colesevelam (WELCHOL) 625 MG tablet Take 1,250 mg by mouth daily.        . digoxin (LANOXIN) 0.125 MG tablet Take 125 mcg by mouth daily.      Marland Kitchen diltiazem (TIAZAC) 420 MG 24 hr capsule Take 420 mg by mouth daily.      . fish oil-omega-3 fatty acids 1000 MG capsule Take 1 g by mouth daily.      . furosemide (LASIX) 40 MG tablet Take 80 mg by mouth 2 (two) times daily.      Marland Kitchen glimepiride  (AMARYL) 4 MG tablet Take 1 tablet by mouth Twice daily.      . isosorbide mononitrate (IMDUR) 30 MG 24 hr tablet Take 30 mg by mouth daily.      Marland Kitchen latanoprost (XALATAN) 0.005 % ophthalmic solution 1 drop at bedtime.        . metFORMIN (GLUCOPHAGE) 1000 MG tablet Take 1,000 mg by mouth 2 (two) times daily with a meal.      . metoprolol (LOPRESSOR) 50 MG tablet Take 50 mg by mouth 2 (two) times daily.      Marland Kitchen NOVOLOG MIX 70/30 FLEXPEN (70-30) 100 UNIT/ML injection Inject 20-30 Units into the skin Twice daily. 30 units in the morning & 20 units in the evening      . potassium chloride (K-DUR,KLOR-CON) 10 MEQ tablet Take 10 mEq by mouth daily.      Marland Kitchen warfarin (COUMADIN) 5 MG tablet Take 5-10 mg by mouth daily. Alternates days and takes 10 mg on Tuesdays, Thursdays & Saturday.  & 5 mg other  days of the week.      Marland Kitchen Besifloxacin HCl (BESIVANCE) 0.6 % SUSP Place 1 drop into both eyes 4 (four) times daily as needed. For 2 days following eye injections       . nitroGLYCERIN (NITROSTAT) 0.4 MG SL tablet Place 0.4 mg under the tongue every 5 (five) minutes as needed. As needed for chest pain       Scheduled:     . aspirin  324 mg Oral Pre-Cath  . aspirin EC  81 mg Oral Daily  . bivalirudin      . cholecalciferol  1,000 Units Oral Daily  . clopidogrel      . colesevelam  1,250 mg Oral Q breakfast  . digoxin  125 mcg Oral Daily  . diltiazem  300 mg Oral Daily  . fentaNYL      . furosemide  80 mg Oral BID  . glimepiride  4 mg Oral Q breakfast  . heparin      . heparin      . insulin aspart  0-15 Units Subcutaneous TID WC  . insulin aspart protamine-insulin aspart  20 Units Subcutaneous Q supper  . insulin aspart protamine-insulin aspart  30 Units Subcutaneous Q breakfast  . latanoprost  1 drop Both Eyes QHS  . lidocaine      . metoprolol  50 mg Oral BID  . midazolam      . omega-3 acid ethyl esters  1 g Oral Daily  . potassium chloride  10 mEq Oral Daily  . sodium chloride  3 mL Intravenous  Q12H  . verapamil      . DISCONTD: sodium chloride  3 mL Intravenous Q12H   Infusions:     . sodium chloride    . DISCONTD: sodium chloride 1 mL/kg/hr (08/16/11 0627)  . DISCONTD: heparin 950 Units/hr (08/15/11 1249)  . DISCONTD: heparin 1,100 Units/hr (08/15/11 2118)    Assessment:  Alexandria [name] on chronic coumadin for atrial fibrillation. Pt admitted with CP, SOB and found to be in rapid afib.   Is s/p left heart cath: PTCA/DES x 1 to proximal circumflex  INR now down to 1.38 and is now off heparin after cath  Home dose Coumadin 10 mg on Tue, Thur, Sat and 5 mg all other days of the week.  Goal of Therapy:   INR 2-3   Plan:   Coumadin 10 mg po x 1 tonight (will likely be able to restart home dose tomorrow-was therapeutic on admit)  Daily PT/INR  Rolland Porter, Pharm.D., BCPS Clinical Pharmacist Pager: [phone]

## 2011-08-16 NOTE — Op Note (Signed)
Cardiac Catheterization Operative Report  Alexandria [name] [id number] 2/18/20131:44 PM [name], MD, MD  Procedure Performed:  1. Left Heart Catheterization 2. Selective Coronary Angiography 3. Left ventricular angiogram 4. PTCA/DES x 1 proximal Circumflex  Operator: Verne Carrow, MD  Arterial access site:  Right radial artery.   Indication:  NSTEMI in setting of known CAD with presentation with atrial fibrillation with RVR.                                      Procedure Details: The risks, benefits, complications, treatment options, and expected outcomes were discussed with the patient. The patient and/or family concurred with the proposed plan, giving informed consent. The patient was brought to the cath lab after IV hydration was begun and oral premedication was given. The patient was further sedated with Versed and Fentanyl. The right wrist was assessed with an Allens test which was positive. The right wrist was prepped and draped in a sterile fashion. 1% lidocaine was used for local anesthesia. Using the modified Seldinger access technique, a 5 French sheath was placed in the right radial artery. 3 mg Verapamil was given through the sheath. 4000 units IV heparin was given. Standard diagnostic catheters were used to perform selective coronary angiography. A pigtail catheter was used to perform a left ventricular angiogram. I then elected to perform PCI of the severe stenosis in the proximal Circumflex artery. The sheath was upsized to a 6 [place] system. A bolus of Angiomax was given and a drip was started. The patient was given 600 mg po Plavix. The Circumflex artery was engaged with a 6 [place] XB 3.0 guiding catheter. When the ACT was greater than 200, a Cougar IC wire was advanced down the Circumflex. A 2.5 x 12 mm balloon was used to pre-dilate the stenosis. A 2.75 x 18 mm Resolute Integrity DES was deployed in the proximal vessel. A 3. 0 x 15 Bernice balloon was used to  post-dilate the stent. The stenosis was taken from 99% down to 0%. There was excellent flow down the vessel.   The sheath was removed from the right radial artery and a Terumo hemostasis band was applied at the arteriotomy site on the right wrist.    There were no immediate complications. The patient was taken to the recovery area in stable condition.   Hemodynamic Findings: Central aortic pressure: 161/26 Left ventricular pressure: 171/18/11  Angiographic Findings:  Left main: Short segment, no obstructive disease.   Left Anterior Descending Artery: Moderate to large sized vessel that does not reach the apex. The mid vessel after the takeoff of the diagonal branch has a tubular 30% stenosis. The first diagonal branch is a small caliber, bifurcating vessel with diffuse 50% stenosis throughout the proximal segment and diffuse 90% stenosis in the inferior sub-branch. This is grossly unchanged since the last catheterization and would be too small in diameter to consider PCI.   Circumflex Artery: 99% proximal Circumflex stenosis. Distal vessel with 30% stenosis. The first and second obtuse marginal branches are very small with plaque disease. The third obtuse marginal branch is a very small caliber vessel (~1.25mm) and has a 95% ostial stenosis. This vessel is too small to consider PCI.   Right Coronary Artery: Large dominant vessel with diffuse 30% mid vessel stenosis. Mild plaque distally. No obstructive lesions noted.   Left Ventricular Angiogram: LVEF=65%.   Impression: 1. Severe single vessel  CAD with likely culprit for NSTEMI being the proximal circumflex stenosis.  2. Normal LV systolic function. 3. Successful PTCA/DES x 1 proximal Circumflex artery.   Recommendations: Will continue ASA and Plavix today. She will need Plavix for one year. Since she is on long term coumadin therapy, will not plan to use ASA as an outpatient. Will restart coumadin tonight.        Complications:  None.  The patient tolerated the procedure well.

## 2011-08-17 ENCOUNTER — Encounter (HOSPITAL_COMMUNITY): Payer: Self-pay | Admitting: Nurse Practitioner

## 2011-08-17 ENCOUNTER — Other Ambulatory Visit: Payer: Self-pay

## 2011-08-17 DIAGNOSIS — I214 Non-ST elevation (NSTEMI) myocardial infarction: Principal | ICD-10-CM

## 2011-08-17 DIAGNOSIS — N390 Urinary tract infection, site not specified: Secondary | ICD-10-CM

## 2011-08-17 LAB — CBC
MCHC: 35.3 g/dL (ref 30.0–36.0)
MCV: 83.4 fL (ref 78.0–100.0)
Platelets: 195 10*3/uL (ref 150–400)
RDW: 14.1 % (ref 11.5–15.5)
WBC: 8.4 10*3/uL (ref 4.0–10.5)

## 2011-08-17 LAB — BASIC METABOLIC PANEL
Chloride: 98 mEq/L (ref 96–112)
Creatinine, Ser: 0.64 mg/dL (ref 0.50–1.10)
GFR calc Af Amer: >90 mL/min (ref >90)
GFR calc non Af Amer: 83 mL/min — ABNORMAL LOW (ref >90)
Potassium: 3.1 mEq/L — ABNORMAL LOW (ref 3.5–5.1)

## 2011-08-17 LAB — PROTIME-INR: INR: 1.44 (ref 0.00–1.49)

## 2011-08-17 MED ORDER — ASPIRIN 81 MG PO TABS: 81.0000 mg | ORAL_TABLET | Freq: Every day | ORAL | Status: DC

## 2011-08-17 MED ORDER — DILTIAZEM HCL ER COATED BEADS 360 MG PO CP24
360.0000 mg | ORAL_CAPSULE | Freq: Every day | ORAL | Status: DC
  Administered 2011-08-17: 360 mg via ORAL
  Filled 2011-08-17: qty 1

## 2011-08-17 MED ORDER — WARFARIN SODIUM 5 MG PO TABS
5.0000 mg | ORAL_TABLET | Freq: Once | ORAL | Status: AC
  Administered 2011-08-17: 5 mg via ORAL
  Filled 2011-08-17: qty 1

## 2011-08-17 MED ORDER — POTASSIUM CHLORIDE CRYS ER 20 MEQ PO TBCR
40.0000 meq | EXTENDED_RELEASE_TABLET | Freq: Once | ORAL | Status: AC
  Administered 2011-08-17: 40 meq via ORAL
  Filled 2011-08-17: qty 2

## 2011-08-17 MED ORDER — POTASSIUM CHLORIDE CRYS ER 20 MEQ PO TBCR
40.0000 meq | EXTENDED_RELEASE_TABLET | Freq: Once | ORAL | Status: AC
Start: 2011-08-17 — End: 2011-08-17
  Administered 2011-08-17: 40 meq via ORAL
  Filled 2011-08-17: qty 2

## 2011-08-17 MED ORDER — CIPROFLOXACIN HCL 250 MG PO TABS
250.0000 mg | ORAL_TABLET | Freq: Two times a day (BID) | ORAL | Status: DC
  Administered 2011-08-17: 250 mg via ORAL
  Filled 2011-08-17 (×3): qty 1

## 2011-08-17 MED ORDER — CLOPIDOGREL BISULFATE 75 MG PO TABS: 75.0000 mg | ORAL_TABLET | ORAL | Status: DC

## 2011-08-17 MED ORDER — CIPROFLOXACIN HCL 250 MG PO TABS: 250.0000 mg | ORAL_TABLET | Freq: Two times a day (BID) | ORAL | Status: AC

## 2011-08-17 MED ORDER — CLOPIDOGREL BISULFATE 75 MG PO TABS
75.0000 mg | ORAL_TABLET | ORAL | Status: AC
  Administered 2011-08-17: 75 mg via ORAL

## 2011-08-17 MED ORDER — METFORMIN HCL 1000 MG PO TABS: 1000.0000 mg | ORAL_TABLET | Freq: Two times a day (BID) | ORAL | Status: DC

## 2011-08-17 MED FILL — Dextrose Inj 5%: INTRAVENOUS | Qty: 50 | Status: AC

## 2011-08-17 NOTE — Progress Notes (Signed)
Placed pt on 2L [place] for desats in to the mid 80's.  Stable, will continue to assess.

## 2011-08-17 NOTE — Discharge Summary (Signed)
Patient ID: Alexandria [name],  MRN: [id number], DOB/AGE: January 01, 1930 78 y.o.  Admit date: 08/13/2011 Discharge date: 08/17/2011  Primary Care Provider: [name], MD Primary Cardiologist: J. Hochrein  Discharge Diagnoses Principal Problem:  *NSTEMI (non-ST elevated myocardial infarction) Active Problems:  DIABETES MELLITUS  HYPERLIPIDEMIA  HYPERTENSION  CAD  Atrial fibrillation  PULMONARY FIBROSIS  Acute on chronic diastolic heart failure  UTI (lower urinary tract infection)   Allergies Allergies  Allergen Reactions  . Ace Inhibitors Cough    Enalapril  benicor    . Crestor (Rosuvastatin Calcium)     Fatigue and leg pain   . Lipitor (Atorvastatin Calcium)     Myalgias   . Niaspan (Niacin)     Intol   . Olmesartan   . Prednisone     REACTION: "retained fluid"    Procedures  08/16/2011 Cardiac Catheterization and Percutaneous Coronary Intervention & Stenting  Angiographic Findings:  Left main: Short segment, no obstructive disease.    Left Anterior Descending Artery: Moderate to large sized vessel that does not reach the apex. The mid vessel after the takeoff of the diagonal branch has a tubular 30% stenosis. The first diagonal branch is a small caliber, bifurcating vessel with diffuse 50% stenosis throughout the proximal segment and diffuse 90% stenosis in the inferior sub-branch. This is grossly unchanged since the last catheterization and would be too small in diameter to consider PCI.    Circumflex Artery: 99% proximal Circumflex stenosis. Distal vessel with 30% stenosis. The first and second obtuse marginal branches are very small with plaque disease. The third obtuse marginal branch is a very small caliber vessel (~1.72mm) and has a 95% ostial stenosis. This vessel is too small to consider PCI.    Right Coronary Artery: Large dominant vessel with diffuse 30% mid vessel stenosis. Mild plaque distally. No obstructive lesions noted.   Left Ventricular Angiogram:  LVEF=65%.   **The left circumflex was successfully stented with a 2.75 x 18 mm Resolute Integrity DES** _________________________________________  History of Present Illness  77 year old female with the above complex problem list who was in her usual state of health until the morning of admission when she had severe 10 out of 10 chest pain radiating to her wrists associated with dyspnea and tachypalpitations.  Symptoms persisted for a few hours and patient finally called EMS approximately 10:30 AM. She was found to be in atrial fibrillation at a rate of 170 beats per minute. She was treated with sublingual nitroglycerin without much improvement, placed on oxygen, and taken to the [facility] emergency department. There, she was placed on IV diltiazem with some improvement in rate control. She did continue to have mild chest discomfort. ECG showed fairly significant inferolateral ST segment depression. Patient was admitted for further evaluation.   Hospital Course  Patient in for non-ST segment elevation myocardial infarction eventually peaking her CK at 166, and B. At 15.5, and troponin I at 8.76. As results, her Coumadin was placed on hold and arrangements for diagnostic catheterization were made.  He should converted to sinus rhythm while on IV diltiazem and this was switched back to oral diltiazem over the weekend. She has had no further atrial fibrillation. As noted above, her Coumadin was placed on hold and she was heparinized once INR was below 2.  She underwent diagnostic cardiac catheterization on February 18 revealing 99% proximal stenosis within the left circumflex artery. She also had significant branch vessel disease involving a very small third obtuse marginal and the inferior sub-branch of  the first diagonal.  Attention was turned to left circumflex and this area was successfully stented using a 2.75 x 18 mm resolute integrity drug-eluting stent. Patient tolerated procedure well and post  procedure has been doing without recurrent symptoms or limitations. We have resumed her Coumadin this morning and plan to discharge her home today in good condition. Because she'll be on concomitant Coumadin and Plavix therapy plan to use aspirin for one week only.  Of note, patient's have foul-smelling urine and has been placed on Cipro therapy.  Because she will be on an antibiotic as well as Coumadin, we will have her follow-up her INR later this week.  Discharge Vitals Blood pressure 116/80, pulse 64, temperature 98.2 F (36.8 C), temperature source Oral, resp. rate 18, height 5\' 4"  (1.626 m), weight 156 lb 1.4 oz (70.8 kg), SpO2 98.00%.  Filed Weights   08/15/11 0546 08/16/11 0700 08/17/11 0020  Weight: 155 lb 10.3 oz (70.6 kg) 153 lb 3.2 oz (69.491 kg) 156 lb 1.4 oz (70.8 kg)    Labs  CBC  Basename 08/17/11 0421 08/16/11 0715  WBC 8.4 8.5  NEUTROABS -- --  HGB 13.3 13.9  HCT 37.7 38.9  MCV 83.4 82.6  PLT 195 207   Basic Metabolic Panel  Basename 08/17/11 0421  NA 137  K 3.1*  CL 98  CO2 29  GLUCOSE 151*  BUN 13  CREATININE 0.64  CALCIUM 10.6*  MG --  PHOS --   Cardiac Enzymes Lab Results  Component Value Date   CKTOTAL 95 08/14/2011   CKMB 9.4* 08/14/2011   TROPONINI 8.72* 08/14/2011   Fasting Lipid Panel  Basename 08/15/11 0500  CHOL 205*  HDL 38*  LDLCALC 127*  TRIG 201*  CHOLHDL 5.4  LDLDIRECT --   Thyroid Function Tests Lab Results  Component Value Date   TSH 2.182 08/13/2011    Disposition  Pt is being discharged home today in good condition.  Follow-up Plans & Appointments  Follow-up Information    Follow up with [name], MD in 3 days. (For INR)       Follow up with [name], MD on 09/08/2011. (10:30 AM)    Contact information:   [address] [address] [phone]          Discharge Medications   Medication List  As of 08/17/2011 11:14 AM   TAKE these  medications         plavix 75 MG tablet Take 1 tablet (75 mg total) by mouth daily. aspirin 81 MG tablet   Take 1 tablet (81 mg total) by mouth daily. - x 1 week only since pt is on coumadin & Plavix      BESIVANCE 0.6 % Susp   Generic drug: Besifloxacin HCl   Place 1 drop into both eyes 4 (four) times daily as needed. For 2 days following eye injections      ciprofloxacin 250 MG tablet   Commonly known as: CIPRO   Take 1 tablet (250 mg total) by mouth 2 (two) times daily. - x 7 days      colesevelam 625 MG tablet   Commonly known as: WELCHOL   Take 1,250 mg by mouth daily.      digoxin 0.125 MG tablet   Commonly known as: LANOXIN   Take 125 mcg by mouth daily.      diltiazem 420 MG 24 hr capsule   Commonly known as: TIAZAC  Take 420 mg by mouth daily.      fish oil-omega-3 fatty acids 1000 MG capsule   Take 1 g by mouth daily.      furosemide 40 MG tablet   Commonly known as: LASIX   Take 80 mg by mouth 2 (two) times daily.      glimepiride 4 MG tablet   Commonly known as: AMARYL   Take 1 tablet by mouth Twice daily.      isosorbide mononitrate 30 MG 24 hr tablet   Commonly known as: IMDUR   Take 30 mg by mouth daily.      latanoprost 0.005 % ophthalmic solution   Commonly known as: XALATAN   1 drop at bedtime.      metFORMIN 1000 MG tablet   Commonly known as: GLUCOPHAGE   Take 1 tablet (1,000 mg total) by mouth 2 (two) times daily with a meal.   Start taking on: 08/18/2011      metoprolol 50 MG tablet   Commonly known as: LOPRESSOR   Take 50 mg by mouth 2 (two) times daily.      nitroGLYCERIN 0.4 MG SL tablet   Commonly known as: NITROSTAT   Place 0.4 mg under the tongue every 5 (five) minutes as needed. As needed for chest pain      NOVOLOG MIX 70/30 FLEXPEN (70-30) 100 UNIT/ML injection   Generic drug: insulin aspart protamine-insulin aspart   Inject 20-30 Units into the skin Twice daily. 30 units in the morning & 20 units in the evening       potassium chloride 10 MEQ tablet   Commonly known as: K-DUR,KLOR-CON   Take 10 mEq by mouth daily.      Vitamin D 1000 UNITS capsule   Take 1,000 Units by mouth daily. OTC        warfarin 5 MG tablet   Commonly known as: COUMADIN   Take 5-10 mg by mouth daily. Alternates days and takes 10 mg on Tuesdays, Thursdays & Saturday.  & 5 mg other days of the week.            Outstanding Labs/Studies  Follow-up INR on 2/22  Duration of Discharge Encounter   Greater than 30 minutes including physician time.  Signed, Nicolasa Ducking NP 08/17/2011, 11:22 AM

## 2011-08-17 NOTE — Progress Notes (Signed)
Contacted MD on call for [facility] regarding pts potassium level of 3.1.  Orders received.

## 2011-08-17 NOTE — Progress Notes (Signed)
CARDIAC REHAB PHASE I   PRE:  Rate/Rhythm: 76 SR  BP:  Supine:   Sitting: 118/80  Standing:    SaO2: 95 RA  MODE:  Ambulation: 340 ft   POST:  Rate/Rhythem: 110 SR  BP:  Supine:   Sitting: 151/91  Standing:    SaO2:  5621-3086 Assisted X 1 to ambulate. Gait steady VS stable. Completed discharge education with pt.  She declines Outpt. CRP, not interested.  Beatrix Fetters

## 2011-08-17 NOTE — Progress Notes (Signed)
SUBJECTIVE: No events. No complaints.   BP 176/64  Pulse 64  Temp(Src) 98.4 F (36.9 C) (Oral)  Resp 19  Ht 5\' 4"  (1.626 m)  Wt 156 lb 1.4 oz (70.8 kg)  BMI 26.79 kg/m2  SpO2 99%  Intake/Output Summary (Last 24 hours) at 08/17/11 0759 Last data filed at 08/16/11 2300  Gross per 24 hour  Intake 446.25 ml  Output    900 ml  Net -453.75 ml    PHYSICAL EXAM General: Well developed, well nourished, in no acute distress. Alert and oriented x 3.  Psych:  Good affect, responds appropriately Neck: No JVD. No masses noted.  Lungs: Clear bilaterally with no wheezes or rhonci noted.  Heart: Irregular, with no murmurs noted. Abdomen: Bowel sounds are present. Soft, non-tender.  Extremities: No lower extremity edema.   LABS: Basic Metabolic Panel:  Basename 08/17/11 0421  NA 137  K 3.1*  CL 98  CO2 29  GLUCOSE 151*  BUN 13  CREATININE 0.64  CALCIUM 10.6*  MG --  PHOS --   CBC:  Basename 08/17/11 0421 08/16/11 0715  WBC 8.4 8.5  NEUTROABS -- --  HGB 13.3 13.9  HCT 37.7 38.9  MCV 83.4 82.6  PLT 195 207   Fasting Lipid Panel:  Basename 08/15/11 0500  CHOL 205*  HDL 38*  LDLCALC 127*  TRIG 201*  CHOLHDL 5.4  LDLDIRECT --    Current Meds:    . aspirin EC  81 mg Oral Daily  . bivalirudin      . cholecalciferol  1,000 Units Oral Daily  . clopidogrel      . colesevelam  1,250 mg Oral Q breakfast  . digoxin  125 mcg Oral Daily  . diltiazem  300 mg Oral Daily  . fentaNYL      . furosemide  80 mg Oral BID  . glimepiride  4 mg Oral Q breakfast  . heparin      . heparin      . insulin aspart  0-15 Units Subcutaneous TID WC  . insulin aspart protamine-insulin aspart  20 Units Subcutaneous Q supper  . insulin aspart protamine-insulin aspart  30 Units Subcutaneous Q breakfast  . latanoprost  1 drop Both Eyes QHS  . lidocaine      . metoprolol  50 mg Oral BID  . midazolam      . omega-3 acid ethyl esters  1 g Oral Daily  . potassium chloride  10 mEq Oral  Daily  . potassium chloride  40 mEq Oral Once  . sodium chloride  3 mL Intravenous Q12H  . verapamil      . warfarin  10 mg Oral ONCE-1800  . DISCONTD: sodium chloride  3 mL Intravenous Q12H     ASSESSMENT AND PLAN:  1. CAD: Stable s/p cath yesterday with DES x 1 proximal Circumflex artery. Doing well. She will need Plavix for at least one year. Will continue ASA for one week at 81 mg po Qdaily. Will not continue ASA past one week as she will be back on her coumadin. Long term will use Plavix therapy alone for ACS since she will be on coumadin for atrial fibrillation.   2. Atrial fibrillation: Rate controlled. Continue digoxin and diltiazem. She is on 420 mg Cardizem CD at home. Will increase to home dose on discharge. Restart coumadin. She will need f/u in coumadin clinic in one week.   3. DM: Continue amaryl. Holding metformin for two days post cath.  4. Foul smelling urine: She has no burning with urination but she has frequent UTI and her urine changed last 24 hours. Will treat with Cipro 250 mg po BID x 3 days.   5. Hypokalemia: 40 meq KCl given this am. Will give additional 40 meq KCl now.   5. Dispo: d/c home today. F/U Dr. Antoine Poche as planned in one week. She already has a f/u appt. F/u coumadin clinic one week.   Alexandria [name]  2/19/20137:59 AM

## 2011-08-17 NOTE — Progress Notes (Signed)
Contacted Dr. Sallyanne Kuster regarding pt SBPs running in the 170's.  No orders were received.  Told to wait until morning BP meds are passed.  Will continue to assess.

## 2011-08-18 ENCOUNTER — Other Ambulatory Visit: Payer: Self-pay | Admitting: Cardiology

## 2011-08-18 NOTE — Discharge Summary (Signed)
Full note written am 08/17/11. cdm

## 2011-08-31 ENCOUNTER — Inpatient Hospital Stay (HOSPITAL_COMMUNITY): Payer: Medicare Other

## 2011-08-31 ENCOUNTER — Emergency Department (HOSPITAL_COMMUNITY): Payer: Medicare Other

## 2011-08-31 ENCOUNTER — Encounter (HOSPITAL_COMMUNITY): Payer: Self-pay | Admitting: Emergency Medicine

## 2011-08-31 ENCOUNTER — Inpatient Hospital Stay (HOSPITAL_COMMUNITY)
Admission: EM | Admit: 2011-08-31 | Discharge: 2011-09-06 | DRG: 309 | Disposition: A | Discharge status: Home Health | Payer: Medicare Other | Source: Ambulatory Visit | Attending: Family Medicine | Admitting: Family Medicine

## 2011-08-31 DIAGNOSIS — Z85820 Personal history of malignant melanoma of skin: Secondary | ICD-10-CM

## 2011-08-31 DIAGNOSIS — G4733 Obstructive sleep apnea (adult) (pediatric): Secondary | ICD-10-CM | POA: Diagnosis present

## 2011-08-31 DIAGNOSIS — R609 Edema, unspecified: Secondary | ICD-10-CM

## 2011-08-31 DIAGNOSIS — D72829 Elevated white blood cell count, unspecified: Secondary | ICD-10-CM | POA: Diagnosis present

## 2011-08-31 DIAGNOSIS — E119 Type 2 diabetes mellitus without complications: Secondary | ICD-10-CM | POA: Diagnosis present

## 2011-08-31 DIAGNOSIS — I959 Hypotension, unspecified: Secondary | ICD-10-CM | POA: Diagnosis present

## 2011-08-31 DIAGNOSIS — G250 Essential tremor: Secondary | ICD-10-CM | POA: Diagnosis present

## 2011-08-31 DIAGNOSIS — I4891 Unspecified atrial fibrillation: Principal | ICD-10-CM | POA: Diagnosis present

## 2011-08-31 DIAGNOSIS — R0602 Shortness of breath: Secondary | ICD-10-CM

## 2011-08-31 DIAGNOSIS — E785 Hyperlipidemia, unspecified: Secondary | ICD-10-CM | POA: Diagnosis present

## 2011-08-31 DIAGNOSIS — J8410 Pulmonary fibrosis, unspecified: Secondary | ICD-10-CM | POA: Insufficient documentation

## 2011-08-31 DIAGNOSIS — I5032 Chronic diastolic (congestive) heart failure: Secondary | ICD-10-CM | POA: Diagnosis present

## 2011-08-31 DIAGNOSIS — Z9861 Coronary angioplasty status: Secondary | ICD-10-CM

## 2011-08-31 DIAGNOSIS — N390 Urinary tract infection, site not specified: Secondary | ICD-10-CM

## 2011-08-31 DIAGNOSIS — Z7901 Long term (current) use of anticoagulants: Secondary | ICD-10-CM

## 2011-08-31 DIAGNOSIS — I5033 Acute on chronic diastolic (congestive) heart failure: Secondary | ICD-10-CM

## 2011-08-31 DIAGNOSIS — E669 Obesity, unspecified: Secondary | ICD-10-CM

## 2011-08-31 DIAGNOSIS — R531 Weakness: Secondary | ICD-10-CM | POA: Diagnosis present

## 2011-08-31 DIAGNOSIS — I491 Atrial premature depolarization: Secondary | ICD-10-CM

## 2011-08-31 DIAGNOSIS — Z794 Long term (current) use of insulin: Secondary | ICD-10-CM

## 2011-08-31 DIAGNOSIS — R079 Chest pain, unspecified: Secondary | ICD-10-CM | POA: Diagnosis present

## 2011-08-31 DIAGNOSIS — I2510 Atherosclerotic heart disease of native coronary artery without angina pectoris: Secondary | ICD-10-CM | POA: Diagnosis present

## 2011-08-31 DIAGNOSIS — Z86010 Personal history of colonic polyps: Secondary | ICD-10-CM

## 2011-08-31 DIAGNOSIS — I5043 Acute on chronic combined systolic (congestive) and diastolic (congestive) heart failure: Secondary | ICD-10-CM | POA: Diagnosis present

## 2011-08-31 DIAGNOSIS — R634 Abnormal weight loss: Secondary | ICD-10-CM | POA: Diagnosis present

## 2011-08-31 DIAGNOSIS — I509 Heart failure, unspecified: Secondary | ICD-10-CM

## 2011-08-31 DIAGNOSIS — G252 Other specified forms of tremor: Secondary | ICD-10-CM | POA: Diagnosis present

## 2011-08-31 DIAGNOSIS — G4730 Sleep apnea, unspecified: Secondary | ICD-10-CM | POA: Diagnosis present

## 2011-08-31 DIAGNOSIS — I951 Orthostatic hypotension: Secondary | ICD-10-CM | POA: Diagnosis present

## 2011-08-31 DIAGNOSIS — I252 Old myocardial infarction: Secondary | ICD-10-CM

## 2011-08-31 DIAGNOSIS — I10 Essential (primary) hypertension: Secondary | ICD-10-CM | POA: Diagnosis present

## 2011-08-31 DIAGNOSIS — I214 Non-ST elevation (NSTEMI) myocardial infarction: Secondary | ICD-10-CM

## 2011-08-31 DIAGNOSIS — Z7902 Long term (current) use of antithrombotics/antiplatelets: Secondary | ICD-10-CM

## 2011-08-31 DIAGNOSIS — R55 Syncope and collapse: Secondary | ICD-10-CM | POA: Diagnosis present

## 2011-08-31 DIAGNOSIS — E538 Deficiency of other specified B group vitamins: Secondary | ICD-10-CM | POA: Diagnosis present

## 2011-08-31 LAB — DIFFERENTIAL
Basophils Absolute: 0 10*3/uL (ref 0.0–0.1)
Lymphocytes Relative: 12 % (ref 12–46)
Lymphs Abs: 1.3 10*3/uL (ref 0.7–4.0)
Monocytes Absolute: 1 10*3/uL (ref 0.1–1.0)
Monocytes Relative: 9 % (ref 3–12)
Neutro Abs: 8.9 10*3/uL — ABNORMAL HIGH (ref 1.7–7.7)

## 2011-08-31 LAB — URINE MICROSCOPIC-ADD ON

## 2011-08-31 LAB — URINALYSIS, ROUTINE W REFLEX MICROSCOPIC
Bilirubin Urine: NEGATIVE
Glucose, UA: NEGATIVE mg/dL
Hgb urine dipstick: NEGATIVE
Ketones, ur: NEGATIVE mg/dL
Protein, ur: 30 mg/dL — AB
Protein, ur: NEGATIVE mg/dL
Urobilinogen, UA: 1 mg/dL (ref 0.0–1.0)
Urobilinogen, UA: 1 mg/dL (ref 0.0–1.0)

## 2011-08-31 LAB — MRSA PCR SCREENING: MRSA by PCR: NEGATIVE

## 2011-08-31 LAB — BASIC METABOLIC PANEL
CO2: 24 mEq/L (ref 19–32)
Chloride: 96 mEq/L (ref 96–112)
Creatinine, Ser: 0.76 mg/dL (ref 0.50–1.10)
Glucose, Bld: 302 mg/dL — ABNORMAL HIGH (ref 70–99)

## 2011-08-31 LAB — CBC
HCT: 37.2 % (ref 36.0–46.0)
Hemoglobin: 13.3 g/dL (ref 12.0–15.0)
RBC: 4.54 MIL/uL (ref 3.87–5.11)
RDW: 13.7 % (ref 11.5–15.5)
WBC: 11.3 10*3/uL — ABNORMAL HIGH (ref 4.0–10.5)

## 2011-08-31 LAB — CARDIAC PANEL(CRET KIN+CKTOT+MB+TROPI): Troponin I: <0.3 ng/mL (ref <0.30)

## 2011-08-31 LAB — PRO B NATRIURETIC PEPTIDE: Pro B Natriuretic peptide (BNP): 1848 pg/mL — ABNORMAL HIGH (ref 0–450)

## 2011-08-31 LAB — MAGNESIUM: Magnesium: 1.2 mg/dL — ABNORMAL LOW (ref 1.5–2.5)

## 2011-08-31 LAB — TROPONIN I: Troponin I: <0.3 ng/mL (ref <0.30)

## 2011-08-31 MED ORDER — INSULIN ASPART PROT & ASPART (70-30 MIX) 100 UNIT/ML ~~LOC~~ SUSP
20.0000 [IU] | Freq: Two times a day (BID) | SUBCUTANEOUS | Status: DC
  Administered 2011-09-01: 34 [IU] via SUBCUTANEOUS
  Filled 2011-08-31: qty 3

## 2011-08-31 MED ORDER — WARFARIN SODIUM 5 MG PO TABS: 5.0000 mg | ORAL_TABLET | Freq: Every day | ORAL | Status: DC

## 2011-08-31 MED ORDER — ALUM & MAG HYDROXIDE-SIMETH 200-200-20 MG/5ML PO SUSP: 30.0000 mL | Freq: Four times a day (QID) | ORAL | Status: DC | PRN

## 2011-08-31 MED ORDER — DILTIAZEM HCL 50 MG/10ML IV SOLN
10.0000 mg | Freq: Once | INTRAVENOUS | Status: AC
  Administered 2011-08-31: 10 mg via INTRAVENOUS

## 2011-08-31 MED ORDER — OXYCODONE HCL 5 MG PO TABS: 5.0000 mg | ORAL_TABLET | ORAL | Status: DC | PRN

## 2011-08-31 MED ORDER — ISOSORBIDE MONONITRATE ER 30 MG PO TB24
30.0000 mg | ORAL_TABLET | Freq: Every day | ORAL | Status: DC
  Administered 2011-08-31 – 2011-09-06 (×7): 30 mg via ORAL
  Filled 2011-08-31 (×7): qty 1

## 2011-08-31 MED ORDER — OMEGA-3 FATTY ACIDS 1000 MG PO CAPS: 1.0000 g | ORAL_CAPSULE | Freq: Every day | ORAL | Status: DC

## 2011-08-31 MED ORDER — WARFARIN SODIUM 10 MG PO TABS
10.0000 mg | ORAL_TABLET | ORAL | Status: AC
  Administered 2011-08-31: 10 mg via ORAL
  Filled 2011-08-31 (×2): qty 1

## 2011-08-31 MED ORDER — SODIUM CHLORIDE 0.9 % IV SOLN: 250.0000 mL | INTRAVENOUS | Status: DC | PRN

## 2011-08-31 MED ORDER — DILTIAZEM HCL 25 MG/5ML IV SOLN: 15.0000 mg | Freq: Once | INTRAVENOUS | Status: DC

## 2011-08-31 MED ORDER — METOPROLOL TARTRATE 50 MG PO TABS
50.0000 mg | ORAL_TABLET | Freq: Two times a day (BID) | ORAL | Status: DC
  Administered 2011-08-31 – 2011-09-06 (×12): 50 mg via ORAL
  Filled 2011-08-31 (×13): qty 1

## 2011-08-31 MED ORDER — DILTIAZEM HCL 100 MG IV SOLR
5.0000 mg/h | INTRAVENOUS | Status: DC
  Administered 2011-08-31: 5 mg/h via INTRAVENOUS
  Filled 2011-08-31 (×2): qty 100

## 2011-08-31 MED ORDER — ACETAMINOPHEN 650 MG RE SUPP: 650.0000 mg | Freq: Four times a day (QID) | RECTAL | Status: DC | PRN

## 2011-08-31 MED ORDER — COLESEVELAM HCL 625 MG PO TABS
1250.0000 mg | ORAL_TABLET | Freq: Every day | ORAL | Status: DC
  Administered 2011-09-01 – 2011-09-06 (×6): 1250 mg via ORAL
  Filled 2011-08-31 (×6): qty 2

## 2011-08-31 MED ORDER — WARFARIN SODIUM 10 MG PO TABS
10.0000 mg | ORAL_TABLET | ORAL | Status: DC
  Filled 2011-08-31: qty 1

## 2011-08-31 MED ORDER — OMEGA-3-ACID ETHYL ESTERS 1 G PO CAPS
1.0000 g | ORAL_CAPSULE | Freq: Every day | ORAL | Status: DC
  Administered 2011-08-31 – 2011-09-06 (×7): 1 g via ORAL
  Filled 2011-08-31 (×7): qty 1

## 2011-08-31 MED ORDER — SODIUM CHLORIDE 0.9 % IJ SOLN: 3.0000 mL | Freq: Two times a day (BID) | INTRAMUSCULAR | Status: DC

## 2011-08-31 MED ORDER — METOPROLOL TARTRATE 1 MG/ML IV SOLN: 5.0000 mg | Freq: Three times a day (TID) | INTRAVENOUS | Status: DC

## 2011-08-31 MED ORDER — SENNOSIDES-DOCUSATE SODIUM 8.6-50 MG PO TABS
1.0000 | ORAL_TABLET | Freq: Every evening | ORAL | Status: DC | PRN
  Filled 2011-08-31: qty 1

## 2011-08-31 MED ORDER — DIGOXIN 125 MCG PO TABS
125.0000 ug | ORAL_TABLET | Freq: Every day | ORAL | Status: DC
  Administered 2011-08-31 – 2011-09-06 (×7): 125 ug via ORAL
  Filled 2011-08-31 (×7): qty 1

## 2011-08-31 MED ORDER — ONDANSETRON HCL 4 MG/2ML IJ SOLN: 4.0000 mg | Freq: Four times a day (QID) | INTRAMUSCULAR | Status: DC | PRN

## 2011-08-31 MED ORDER — PANTOPRAZOLE SODIUM 40 MG PO TBEC
40.0000 mg | DELAYED_RELEASE_TABLET | Freq: Every day | ORAL | Status: DC
  Administered 2011-09-01 – 2011-09-06 (×6): 40 mg via ORAL
  Filled 2011-08-31: qty 1
  Filled 2011-08-31: qty 17
  Filled 2011-08-31 (×3): qty 1

## 2011-08-31 MED ORDER — BESIFLOXACIN HCL 0.6 % OP SUSP: 1.0000 [drp] | Freq: Four times a day (QID) | OPHTHALMIC | Status: DC | PRN

## 2011-08-31 MED ORDER — LATANOPROST 0.005 % OP SOLN
1.0000 [drp] | Freq: Every day | OPHTHALMIC | Status: DC
  Administered 2011-08-31 – 2011-09-05 (×6): 1 [drp] via OPHTHALMIC
  Filled 2011-08-31: qty 2.5

## 2011-08-31 MED ORDER — POTASSIUM CHLORIDE CRYS ER 10 MEQ PO TBCR
10.0000 meq | EXTENDED_RELEASE_TABLET | Freq: Every day | ORAL | Status: DC
  Administered 2011-08-31 – 2011-09-02 (×3): 10 meq via ORAL
  Filled 2011-08-31 (×3): qty 1

## 2011-08-31 MED ORDER — METOPROLOL TARTRATE 1 MG/ML IV SOLN: 5.0000 mg | Freq: Three times a day (TID) | INTRAVENOUS | Status: DC | PRN

## 2011-08-31 MED ORDER — ACETAMINOPHEN 325 MG PO TABS
650.0000 mg | ORAL_TABLET | Freq: Four times a day (QID) | ORAL | Status: DC | PRN
  Administered 2011-09-01 – 2011-09-02 (×3): 650 mg via ORAL
  Filled 2011-08-31 (×3): qty 2

## 2011-08-31 MED ORDER — SODIUM CHLORIDE 0.9 % IV BOLUS (SEPSIS)
500.0000 mL | Freq: Once | INTRAVENOUS | Status: AC
  Administered 2011-08-31: 500 mL via INTRAVENOUS

## 2011-08-31 MED ORDER — CLOPIDOGREL BISULFATE 75 MG PO TABS
75.0000 mg | ORAL_TABLET | ORAL | Status: AC
  Administered 2011-08-31: 75 mg via ORAL
  Filled 2011-08-31: qty 1

## 2011-08-31 MED ORDER — WARFARIN SODIUM 5 MG PO TABS
5.0000 mg | ORAL_TABLET | ORAL | Status: AC
  Administered 2011-09-01: 5 mg via ORAL
  Filled 2011-08-31: qty 1

## 2011-08-31 MED ORDER — ONDANSETRON HCL 4 MG PO TABS: 4.0000 mg | ORAL_TABLET | Freq: Four times a day (QID) | ORAL | Status: DC | PRN

## 2011-08-31 MED ORDER — INSULIN ASPART 100 UNIT/ML ~~LOC~~ SOLN
0.0000 [IU] | Freq: Three times a day (TID) | SUBCUTANEOUS | Status: DC
  Administered 2011-09-01: 5 [IU] via SUBCUTANEOUS
  Administered 2011-09-01: 15 [IU] via SUBCUTANEOUS
  Filled 2011-08-31: qty 3

## 2011-08-31 MED ORDER — SODIUM CHLORIDE 0.9 % IJ SOLN: 3.0000 mL | INTRAMUSCULAR | Status: DC | PRN

## 2011-08-31 MED ORDER — WARFARIN - PHARMACIST DOSING INPATIENT
Freq: Every day | Status: DC
  Filled 2011-08-31 (×4): qty 1

## 2011-08-31 MED ORDER — FUROSEMIDE 80 MG PO TABS
80.0000 mg | ORAL_TABLET | Freq: Two times a day (BID) | ORAL | Status: DC
  Administered 2011-09-01 – 2011-09-04 (×6): 80 mg via ORAL
  Filled 2011-08-31 (×8): qty 1

## 2011-08-31 MED ORDER — NITROGLYCERIN 0.4 MG SL SUBL: 0.4000 mg | SUBLINGUAL_TABLET | SUBLINGUAL | Status: DC | PRN

## 2011-08-31 NOTE — ED Notes (Addendum)
Diltiazem drip titrated up to 15mg /hour via alaris pump guardrails per admitting team.  Pt with HR 102-120.

## 2011-08-31 NOTE — ED Notes (Signed)
Communicated with physician. Diltiazem being held at this time. Pt to receive diltiazem if HR increases to greater than 120s.

## 2011-08-31 NOTE — ED Notes (Signed)
Pt with symptoms with orthostatic changes.  Pt with lightheadedness from lying to sitting.  Pt with dizziness/lightheadedness/weakness from sitting to standing.  Pt unable to ambulate due to symptoms.  Pt okay to drink per MD, Bebe Shaggy.

## 2011-08-31 NOTE — ED Notes (Signed)
Iv noted with swelling to site. Will not draw back blood. Iv d/c and new line placed. Cardizem and NS bolus infusing. Pt in no acute distress and without acute changes. HR 136 BP 117/71. Sr up. Family at bedside.

## 2011-08-31 NOTE — ED Provider Notes (Signed)
I have personally seen and examined the patient.  I have discussed the plan of care with the resident.  I have reviewed the documentation on PMH/FH/Soc. History.  I have reviewed the documentation of the resident and agree.  I have reviewed and agree with the ECG interpretation(s) documented by the resident.  Pt with afib, not tolerating ambulation, high risk for fall, will need admission for rate control Pt stabilized in the ED  Joya Gaskins, MD 08/31/11 2129

## 2011-08-31 NOTE — Progress Notes (Signed)
ANTICOAGULATION CONSULT NOTE - Initial Consult  Pharmacy Consult for coumadin Indication: afib  Allergies  Allergen Reactions  . Ace Inhibitors Cough    Enalapril  benicor    . Crestor (Rosuvastatin Calcium)     Fatigue and leg pain   . Lipitor (Atorvastatin Calcium)     Myalgias   . Niaspan (Niacin)     Intol   . Olmesartan   . Prednisone     REACTION: "retained fluid"    Patient Measurements: Wt=70.8kg Ht= 64 inches  Vital Signs: Temp: 97.9 F (36.6 C) (03/05 1212) Temp src: Oral (03/05 1212) BP: 128/57 mmHg (03/05 1745) Pulse Rate: 78  (03/05 1745)  Labs:  Basename 08/31/11 1320 08/31/11 1319  HGB -- 13.3  HCT -- 37.2  PLT -- 257  APTT -- --  LABPROT -- 25.4*  INR -- 2.27*  HEPARINUNFRC -- --  CREATININE -- 0.76  CKTOTAL -- --  CKMB -- --  TROPONINI <0.30 --   The CrCl is unknown because both a height and weight (above a minimum accepted value) are required for this calculation.  Medical History: Past Medical History  Diagnosis Date  . Hypertension     with h/o hypertensive urgency  . Glaucoma   . Tremor, essential   . Diabetes mellitus with neuropathy   . B12 deficiency   . Hyperlipidemia   . Atrial fibrillation     a. on chronic coumadin  . Chronic diastolic heart failure     echo 05/2009: mild LVH, EF 55-60%, grade 2 diast dysfxn, mild LAE, PASP  . CAD (coronary artery disease)     a.  LHC 06/2009: pLAD 40%, prox-mid D1 80% (small);  CFX 40-50%, mRCA 20%, EF 65%  -  med Rx;  b.  05/23/11 Myoview - EF 53%, no isch/inf;  c. 07/2011 NSTEMI - PCI/DES LCX - Resolute stent  . Carotid stenosis     dopplers 6/12: 1-39% bilat ICA  . Pulmonary fibrosis     Dr. Vassie Loll;  patient taken off O2 in 8/12; dyspnea felt CHF>>ILD  . Skin cancer   . Shortness of breath   . Atrial fibrillation   . A-fib 08/31/2011   Coumadin home regmien 10 mg on Tuesdays, Thursdays & Saturday. & 5 mg other days of the week (last dose 08/30/11)  Assessment: 78 yo female here  with weakness. She has a history of afib and is on coumadin at home. INR is currently at goal (2.27)  Goal of Therapy:  INR 2-3   Plan:  -Will Continue coumadin as taken at home -INR daily for now  Benny Lennert 08/31/2011,6:31 PM

## 2011-08-31 NOTE — Consult Note (Signed)
CARDIOLOGY CONSULT NOTE  Patient ID: Alexandria [name] MRN: [id number] DOB/AGE: [birth year] 78 y.o.  Admit date: 08/31/2011 Referring Physician Primary Physician     Alexandria Heap, MD, MD Primary Cardiologist       Alexandria [name] Reason for Consultation       Atrial fibrillation  HPI:  The patient was recently hospitalized in February, 2013 with rapid atrial fibrillation and non-STEMI. She underwent drug-eluting stent. Because of her Atrial fibrillation and the need for Coumadin she was discharged on Coumadin, Plavix, and aspirin. The aspirin was continued for one week only and then stopped. She was continued on Coumadin and Plavix. During her hospitalization she had atrial fib but the records show that she had converted to sinus rhythm. The old records suggest ongoing atrial fib but the recent hospitalization suggest that her atrial fib was paroxysmal. 2 days ago she felt poorly and she now presents with rapid atrial fibrillation again. She's not having any significant chest pain. She is not complaining of shortness of breath.   Past Medical History  Diagnosis Date  . Hypertension     with h/o hypertensive urgency  . Glaucoma   . Tremor, essential   . Diabetes mellitus with neuropathy   . B12 deficiency   . Hyperlipidemia   . Atrial fibrillation     a. on chronic coumadin  . Chronic diastolic heart failure     echo 05/2009: mild LVH, EF 55-60%, grade 2 diast dysfxn, mild LAE, PASP  . CAD (coronary artery disease)     a.  LHC 06/2009: pLAD 40%, prox-mid D1 80% (small);  CFX 40-50%, mRCA 20%, EF 65%  -  med Rx;  b.  05/23/11 Myoview - EF 53%, no isch/inf;  c. 07/2011 NSTEMI - PCI/DES LCX - Resolute stent  . Carotid stenosis     dopplers 6/12: 1-39% bilat ICA  . Pulmonary fibrosis     Dr. Vassie [name];  patient taken off O2 in 8/12; dyspnea felt CHF>>ILD  . Skin cancer   . Shortness of breath   . Atrial fibrillation   . A-fib 08/31/2011    Family History  Problem Relation Age of Onset  . Throat  cancer Brother   . Prostate cancer Father   . Diabetes Mother   . Diabetes Brother     multiple  . Heart disease Brother     Social history    The patient is widowed. She does not smoke.  Past Surgical History  Procedure Date  . Appendectomy   . Nose surgery   . Cardiac catheterization    Review of systems:  Patient denies fever, chills, headache, sweats, rash, change in vision, change in hearing, chest pain, cough, nausea vomiting, urinary symptoms. All other systems are reviewed and are negative.  Physical Exam: Blood pressure 128/57, pulse 78, temperature 97.9 F (36.6 C), temperature source Oral, resp. rate 21, SpO2 95.00%.   Patient is oriented to person time and place. Affect is normal. She is in no distress. Her brother and sister-in-law are in the room. Head is atraumatic. There is no xanthelasma. There is no jugular venous distention. Lung exam reveals rales in the right posterior base. This may be consistent with the patient's history of pulmonary fibrosis. Cardiac exam reveals S1 and S2. The rhythm is irregularly irregular. There is a 2/6 crescendo decrescendo systolic murmur. The abdomen is soft. There is no significant peripheral edema. There no musculoskeletal deformities. There are no skin rashes.  Labs:   Lab Results  Component Value  Date   WBC 11.3* 08/31/2011   HGB 13.3 08/31/2011   HCT 37.2 08/31/2011   MCV 81.9 08/31/2011   PLT 257 08/31/2011    Lab 08/31/11 1319  NA 134*  K 4.0  CL 96  CO2 24  BUN 17  CREATININE 0.76  CALCIUM 10.5  PROT --  BILITOT --  ALKPHOS --  ALT --  AST --  GLUCOSE 302*   Lab Results  Component Value Date   CKTOTAL 95 08/14/2011   CKMB 9.4* 08/14/2011   TROPONINI <0.30 08/31/2011    Lab Results  Component Value Date   CHOL 205* 08/15/2011   CHOL 185 05/21/2011   Lab Results  Component Value Date   HDL 38* 08/15/2011   HDL 47 05/21/2011   Lab Results  Component Value Date   LDLCALC 127* 08/15/2011   LDLCALC 111* 05/21/2011    Lab Results  Component Value Date   TRIG 201* 08/15/2011   TRIG 133 05/21/2011   Lab Results  Component Value Date   CHOLHDL 5.4 08/15/2011   CHOLHDL 3.9 05/21/2011   No results found for this basename: LDLDIRECT      Radiology: Dg Chest 2 View  08/31/2011  *RADIOLOGY REPORT*  Clinical Data: Shortness of breath, chest pain, weakness, fatigue  CHEST - 2 VIEW  Comparison: Portable chest x-ray of 08/13/2011 and chest x-ray of 05/20/2011  Findings: No active infiltrate or effusion is seen.  Mild chronic interstitial change remains.  A calcified granuloma is again noted in the left lower lobe.  Mild cardiomegaly is stable.  The bones are osteopenic.  There is a partially compressed mid thoracic vertebral body which appears old.  IMPRESSION: Stable cardiomegaly and probable mild chronic interstitial change. No active process.  Original Report Authenticated By: Alexandria [name], M.D.   EKG:  EKG is reviewed by me. There is Atrial fibrillation.  ASSESSMENT AND PLAN:    *A-fib   It seems that the patient's current symptoms probably are related to her rapid atrial fibrillation. The patient is anticoagulated with Coumadin. From review of the recent records it appears that the patient is going in and out of atrial fibrillation. She is not tolerating this. She has a history of pulmonary fibrosis. Because of this I am very hesitant to use amiodarone. However in this situation this still may be the best choice. I think that we will have to proceed with drug therapy. Another choice would be Tikosyn. I considered starting one of these this evening. However I feel it is most prudent to wait until the patient is seen by Alexandria [name] who knows her best. I'll be sure that he knows to see her tomorrow morning. I will encourage the addition of an antiarrhythmic.   CAD    Coronary disease is stable after the patient's recent stent. She is on Plavix along with Coumadin. Aspirin was stopped after one week.    CONGESTIVE HEART FAILURE (CHF)    There is a history of diastolic CHF. At this point the patient does not appear to be volume overloaded.   PULMONARY FIBROSIS    There is a history of pulmonary fibrosis. It is my understanding that the patient had been on oxygen in the past. This was stopped when it was felt that her symptoms were more related to CHF. This problem with pulmonary fibrosis makes me hesitant to start amiodarone tonight.   Warfarin anticoagulation     The patient is on Coumadin. She is also on Plavix. Aspirin was  stopped after one week.  Jerral Bonito, MD, [facility]

## 2011-08-31 NOTE — ED Notes (Signed)
Per EMS report, pt was given 324mg  Aspirin prior to arrival.

## 2011-08-31 NOTE — ED Notes (Signed)
MD at bedside. 

## 2011-08-31 NOTE — ED Provider Notes (Signed)
History     CSN: [id number]  Arrival date & time 08/31/11  1158   First MD Initiated Contact with Patient 08/31/11 1203      Chief Complaint  Patient presents with  . Fatigue    (Consider location/radiation/quality/duration/timing/severity/associated sxs/prior treatment) HPI Comments: 78 year old female with history of CAD and atrial fibrillation presenting with generalized weakness and fatigue that started yesterday upon awakening. She states she's been too tired or doctor's office. She endorses mild shortness of breath started this morning. She also endorses a mild "nagging" chest pain that comes intermittently it subsides on its own. She cannot elicit any inciting factors. She denies any leg swelling orthopnea.  Patient is a 78 y.o. female presenting with weakness. The history is provided by the patient.  Weakness Primary symptoms do not include nausea or vomiting. Primary symptoms comment: weakness, generalized The symptoms began yesterday. The episode lasted 1 day. The symptoms are unchanged. The neurological symptoms are diffuse. Context: noticed upon awakening yesterday.  Additional symptoms include weakness (and generalized fatigue). Additional symptoms do not include loss of balance or vertigo.    Past Medical History  Diagnosis Date  . Hypertension     with h/o hypertensive urgency  . Glaucoma   . Tremor, essential   . Diabetes mellitus with neuropathy   . B12 deficiency   . Hyperlipidemia   . Atrial fibrillation     a. on chronic coumadin  . Chronic diastolic heart failure     echo 05/2009: mild LVH, EF 55-60%, grade 2 diast dysfxn, mild LAE, PASP  . CAD (coronary artery disease)     a.  LHC 06/2009: pLAD 40%, prox-mid D1 80% (small);  CFX 40-50%, mRCA 20%, EF 65%  -  med Rx;  b.  05/23/11 Myoview - EF 53%, no isch/inf;  c. 07/2011 NSTEMI - PCI/DES LCX - Resolute stent  . Carotid stenosis     dopplers 6/12: 1-39% bilat ICA  . Pulmonary fibrosis     Dr. Vassie Loll;  patient  taken off O2 in 8/12; dyspnea felt CHF>>ILD  . Skin cancer   . Shortness of breath   . Atrial fibrillation     Past Surgical History  Procedure Date  . Appendectomy   . Nose surgery   . Cardiac catheterization     Family History  Problem Relation Age of Onset  . Throat cancer Brother   . Prostate cancer Father   . Diabetes Mother   . Diabetes Brother     multiple  . Heart disease Brother     History  Substance Use Topics  . Smoking status: Never Smoker   . Smokeless tobacco: Never Used  . Alcohol Use: No    OB History    Grav Para Term Preterm Abortions TAB SAB Ect Mult Living                  Review of Systems  Respiratory: Positive for shortness of breath (mild this morning, resolved.).   Cardiovascular: Positive for chest pain.  Gastrointestinal: Negative for nausea, vomiting, abdominal pain and diarrhea.  Neurological: Positive for weakness (and generalized fatigue). Negative for vertigo and loss of balance.  All other systems reviewed and are negative.    Allergies  Ace inhibitors; Crestor; Lipitor; Niaspan; Olmesartan; and Prednisone  Home Medications   Current Outpatient Rx  Name Route Sig Dispense Refill  . BESIFLOXACIN HCL 0.6 % OP SUSP Both Eyes Place 1 drop into both eyes 4 (four) times daily as needed. For  2 days following eye injections     . VITAMIN D 1000 UNITS PO CAPS Oral Take 1,000 Units by mouth daily. OTC      . CLOPIDOGREL BISULFATE 75 MG PO TABS Oral Take 1 tablet (75 mg total) by mouth stat. 30 tablet 6  . COLESEVELAM HCL 625 MG PO TABS Oral Take 1,250 mg by mouth daily.      Marland Kitchen DIGOXIN 0.125 MG PO TABS Oral Take 125 mcg by mouth daily.    Marland Kitchen DILTIAZEM HCL ER BEADS 420 MG PO CP24 Oral Take 420 mg by mouth daily.    . OMEGA-3 FATTY ACIDS 1000 MG PO CAPS Oral Take 1 g by mouth daily.    . FUROSEMIDE 40 MG PO TABS Oral Take 80 mg by mouth 2 (two) times daily.    Marland Kitchen GLIMEPIRIDE 4 MG PO TABS Oral Take 1 tablet by mouth Twice daily.    .  ISOSORBIDE MONONITRATE ER 30 MG PO TB24 Oral Take 30 mg by mouth daily.    Marland Kitchen LATANOPROST 0.005 % OP SOLN Both Eyes Place 1 drop into both eyes at bedtime.     Marland Kitchen METFORMIN HCL 1000 MG PO TABS Oral Take 1 tablet (1,000 mg total) by mouth 2 (two) times daily with a meal.      Resume on 08/18/2011  . METOPROLOL TARTRATE 50 MG PO TABS Oral Take 50 mg by mouth 2 (two) times daily.    Marland Kitchen NITROGLYCERIN 0.4 MG SL SUBL Sublingual Place 0.4 mg under the tongue every 5 (five) minutes as needed. As needed for chest pain    . NOVOLOG MIX 70/30 FLEXPEN (70-30) 100 UNIT/ML [place] SUSP Subcutaneous Inject 20-34 Units into the skin Twice daily. 34 units in the morning & 20 units in the evening    . POTASSIUM CHLORIDE CRYS ER 10 MEQ PO TBCR Oral Take 10 mEq by mouth daily.    . WARFARIN SODIUM 5 MG PO TABS Oral Take 5-10 mg by mouth daily. Alternates days and takes 10 mg on Tuesdays, Thursdays & Saturday.  & 5 mg other days of the week.      BP 151/81  Temp(Src) 97.9 F (36.6 C) (Oral)  Resp 24  SpO2 99%  Physical Exam  Nursing note and vitals reviewed. Constitutional: She is oriented to person, place, and time. She appears well-developed and well-nourished. No distress.  HENT:  Head: Normocephalic and atraumatic.  Mouth/Throat: Oropharynx is clear and moist.  Eyes: EOM are normal. Pupils are equal, round, and reactive to light.  Neck: Normal range of motion. Neck supple.  Cardiovascular: Normal rate and normal heart sounds.  Exam reveals no friction rub.   No murmur heard.      Irregularly irregular  Pulmonary/Chest: Effort normal and breath sounds normal. No respiratory distress. She has no wheezes. She has no rales.  Abdominal: Soft. There is no tenderness. There is no rebound and no guarding.  Musculoskeletal: Normal range of motion. She exhibits no edema and no tenderness.  Lymphadenopathy:    She has no cervical adenopathy.  Neurological: She is alert and oriented to person, place, and time.  Skin:  Skin is warm and dry. No rash noted.  Psychiatric: She has a normal mood and affect. Her behavior is normal.    ED Course  Procedures (including critical care time)  Results for orders placed during the hospital encounter of 08/31/11  CBC      Component Value Range   WBC 11.3 (*) 4.0 - 10.5 (  K/uL)   RBC 4.54  3.87 - 5.11 (MIL/uL)   Hemoglobin 13.3  12.0 - 15.0 (g/dL)   HCT 10.2  72.5 - 36.6 (%)   MCV 81.9  78.0 - 100.0 (fL)   MCH 29.3  26.0 - 34.0 (pg)   MCHC 35.8  30.0 - 36.0 (g/dL)   RDW 44.0  34.7 - 42.5 (%)   Platelets 257  150 - 400 (K/uL)  DIFFERENTIAL      Component Value Range   Neutrophils Relative 78 (*) 43 - 77 (%)   Neutro Abs 8.9 (*) 1.7 - 7.7 (K/uL)   Lymphocytes Relative 12  12 - 46 (%)   Lymphs Abs 1.3  0.7 - 4.0 (K/uL)   Monocytes Relative 9  3 - 12 (%)   Monocytes Absolute 1.0  0.1 - 1.0 (K/uL)   Eosinophils Relative 1  0 - 5 (%)   Eosinophils Absolute 0.1  0.0 - 0.7 (K/uL)   Basophils Relative 0  0 - 1 (%)   Basophils Absolute 0.0  0.0 - 0.1 (K/uL)  BASIC METABOLIC PANEL      Component Value Range   Sodium 134 (*) 135 - 145 (mEq/L)   Potassium 4.0  3.5 - 5.1 (mEq/L)   Chloride 96  96 - 112 (mEq/L)   CO2 24  19 - 32 (mEq/L)   Glucose, Bld 302 (*) 70 - 99 (mg/dL)   BUN 17  6 - 23 (mg/dL)   Creatinine, Ser 9.56  0.50 - 1.10 (mg/dL)   Calcium 38.7  8.4 - 10.5 (mg/dL)   GFR calc non Af Amer 78 (*) >90 (mL/min)   GFR calc Af Amer >90  >90 (mL/min)  TROPONIN I      Component Value Range   Troponin I <0.30  <0.30 (ng/mL)  PROTIME-INR      Component Value Range   Prothrombin Time 25.4 (*) 11.6 - 15.2 (seconds)   INR 2.27 (*) 0.00 - 1.49   URINALYSIS, ROUTINE W REFLEX MICROSCOPIC      Component Value Range   Color, Urine AMBER (*) YELLOW    APPearance CLOUDY (*) CLEAR    Specific Gravity, Urine 1.018  1.005 - 1.030    pH 6.0  5.0 - 8.0    Glucose, UA NEGATIVE  NEGATIVE (mg/dL)   Hgb urine dipstick NEGATIVE  NEGATIVE    Bilirubin Urine SMALL (*)  NEGATIVE    Ketones, ur 15 (*) NEGATIVE (mg/dL)   Protein, ur 30 (*) NEGATIVE (mg/dL)   Urobilinogen, UA 1.0  0.0 - 1.0 (mg/dL)   Nitrite NEGATIVE  NEGATIVE    Leukocytes, UA TRACE (*) NEGATIVE   DIGOXIN LEVEL      Component Value Range   Digoxin Level 0.9  0.8 - 2.0 (ng/mL)  URINE MICROSCOPIC-ADD ON      Component Value Range   Squamous Epithelial / LPF RARE  RARE    WBC, UA 0-2  <3 (WBC/hpf)   RBC / HPF 0-2  <3 (RBC/hpf)   Casts HYALINE CASTS (*) NEGATIVE    Urine-Other AMORPHOUS URATES/PHOSPHATES      Dg Chest 2 View  08/31/2011  *RADIOLOGY REPORT*  Clinical Data: Shortness of breath, chest pain, weakness, fatigue  CHEST - 2 VIEW  Comparison: Portable chest x-ray of 08/13/2011 and chest x-ray of 05/20/2011  Findings: No active infiltrate or effusion is seen.  Mild chronic interstitial change remains.  A calcified granuloma is again noted in the left lower lobe.  Mild cardiomegaly is stable.  The bones are osteopenic.  There is a partially compressed mid thoracic vertebral body which appears old.  IMPRESSION: Stable cardiomegaly and probable mild chronic interstitial change. No active process.  Original Report Authenticated By: Juline Patch, M.D.   Imaging independently viewed by me, interpreted by radiologist.    Date: 08/31/2011  Rate: 115  Rhythm: atrial fibrillation  QRS Axis: normal  Intervals: normal  ST/T Wave abnormalities: normal  Conduction Disutrbances:none  Narrative Interpretation:   Old EKG Reviewed: sinus rhythm with PACs   1. Atrial fibrillation with rapid ventricular response   2. Orthostasis       MDM  59:10 PM 77 year old female with history of CAD status post recent admission from 2/15-2/19 for an NSTEMI with DES placed to left circumflex on 2/18 presenting with weakness and fatigue that started yesterday. She also endorses mild shortness of breath this morning. She was to go to a doctor's appointment today but states she was too tired to do so. She  denies chest pain, nausea, vomiting, diarrhea, abdominal pain, fever or other recent infectious symptoms. She denies palpitations but is noted to be in atrial fibrillation with RVR with rates in the low 100s. She is not hypoxic but is mildly tachypneic. She does endorse a mild "nagging" substernal chest pain that comes intermittently. She cannot endorse any inciting factors. She is currently chest pain-free. At this point HR is in low 100s so will hold off on dilt gtt for now. We'll check CBC, BMP, troponin, PT/INR, digoxin level, urinalysis and chest x-ray.  3:25 PM labs are without significant abnormality. HR remains in low 100s, but does go up to 120s when she is moving around. Attempted to ambulate patient, but upon sitting up she gets very weak, lightheaded and HR increased to 140s. EF from recent cath is 65%. Will give 500cc bolus. Medicine has been consulted for admission.     Sheran Luz, MD 08/31/11 [phone]

## 2011-08-31 NOTE — ED Notes (Signed)
Per pt report, pt with onset of generalized weakness and fatigue since yesterday. Pt endorses dizziness.  Pt endorses intermittent "nagging" mid-sternal chest pain that went away and returned this morning.  Pt. Denies chest pain at this time.  Pt. Denies nausea vomiting.

## 2011-08-31 NOTE — ED Notes (Signed)
Patient transported from Radiology.

## 2011-08-31 NOTE — ED Notes (Signed)
Consult at bedside.

## 2011-08-31 NOTE — H&P (Signed)
Meganne Rita MRN: [id number] DOB/AGE: 07-05-1929 78 y.o. Primary Care Physician:MOORE, DONALD, MD, MD Admit date: 08/31/2011 Chief Complaint: Generalized weakness/near syncope HPI:  Alexandria [name] is a 78 year old pleasant Caucasian female with a history of atrial fibrillation on chronic Coumadin therapy, history of chronic diastolic heart failure, history of coronary artery disease status post non-ST elevated MI 08/12/2001 status post cardiac catheterization with 99% proximal circumflex stenosis status post resolute integrity drug eluding stent with a EF of 65%, history of diabetes mellitus, history of hyperlipidemia who presents to the ED with generalized weakness and near syncope. Patient states that she had been doing okay 2 days prior to admission. One day prior to admission patient stated that she felt very swimmy headed to the point where she couldn't stand up do too generalized weakness. Patient stated that she stayed in bed most of the day and felt that if she tried to get up she would've passed out. Patient denies any syncope. Patient endorses a nagging chest pain which is dull in nature and constant across the chest with no radiation. Patient denies any palpitations, no paroxysmal nocturnal dyspnea, no orthopnea, no lower extremity edema, no cough, no abdominal pain, no nausea, no vomiting, no diarrhea, no constipation, no fever, no chills. Patient stated that on the day of admission she felt worse and more they had a very weak to the point that she had to cancel her appointment with her PCP. Patient subsequently presented to the ED in the ED patient's heart rate has ranged in the high 140s to 150s and on initial presentation his systolic blood pressure was recorded at 79. Patient had been given a bolus of 500 cc normal saline his systolic blood pressures now in the 150s. Patient also complaining of intermittent head pain that feels like somebody is hitting her with a hammer however states this has been  ongoing for the past 20 years and insulin is slowly getting worse. Patient was seen in the ED EKG done did show A. fib with a RVR with some T-wave changes. Be met obtained did show a sodium of 134 otherwise was within normal limits. First set of troponin was less than 0.30. CBC had a white count of 11.3 otherwise was within normal limits. Patient's INR was 2.27. At this level was 0.9. Patient was placed on a diltiazem drip and was at 10 mics. Will call to admit for further evaluation and management. During the interview patient's heart rate is noted to go up as high as 149.   Past Medical History  Diagnosis Date  . Hypertension     with h/o hypertensive urgency  . Glaucoma   . Tremor, essential   . Diabetes mellitus with neuropathy   . B12 deficiency   . Hyperlipidemia   . Atrial fibrillation     a. on chronic coumadin  . Chronic diastolic heart failure     echo 05/2009: mild LVH, EF 55-60%, grade 2 diast dysfxn, mild LAE, PASP  . CAD (coronary artery disease)     a.  LHC 06/2009: pLAD 40%, prox-mid D1 80% (small);  CFX 40-50%, mRCA 20%, EF 65%  -  med Rx;  b.  05/23/11 Myoview - EF 53%, no isch/inf;  c. 07/2011 NSTEMI - PCI/DES LCX - Resolute stent  . Carotid stenosis     dopplers 6/12: 1-39% bilat ICA  . Pulmonary fibrosis     Dr. Vassie Loll;  patient taken off O2 in 8/12; dyspnea felt CHF>>ILD  . Skin cancer   .  Shortness of breath   . Atrial fibrillation   . A-fib 08/31/2011    Past Surgical History  Procedure Date  . Appendectomy   . Nose surgery   . Cardiac catheterization     Prior to Admission medications   Medication Sig Start Date End Date Taking? Authorizing Provider  Besifloxacin HCl (BESIVANCE) 0.6 % SUSP Place 1 drop into both eyes 4 (four) times daily as needed. For 2 days following eye injections    Yes Historical Provider, MD  Cholecalciferol (VITAMIN D) 1000 UNITS capsule Take 1,000 Units by mouth daily. OTC     Yes Historical Provider, MD  clopidogrel (PLAVIX) 75 MG  tablet Take 1 tablet (75 mg total) by mouth stat. 08/17/11 08/16/12 Yes Ok Anis, NP  colesevelam [facility]) 625 MG tablet Take 1,250 mg by mouth daily.     Yes Historical Provider, MD  digoxin (LANOXIN) 0.125 MG tablet Take 125 mcg by mouth daily.   Yes Historical Provider, MD  diltiazem (TIAZAC) 420 MG 24 hr capsule Take 420 mg by mouth daily. 05/23/11 05/22/12 Yes Scott Moishe Spice, PA  fish oil-omega-3 fatty acids 1000 MG capsule Take 1 g by mouth daily.   Yes Historical Provider, MD  furosemide (LASIX) 40 MG tablet Take 80 mg by mouth 2 (two) times daily.   Yes Historical Provider, MD  glimepiride (AMARYL) 4 MG tablet Take 1 tablet by mouth Twice daily. 01/28/11  Yes Historical Provider, MD  isosorbide mononitrate (IMDUR) 30 MG 24 hr tablet Take 30 mg by mouth daily.   Yes Historical Provider, MD  latanoprost (XALATAN) 0.005 % ophthalmic solution Place 1 drop into both eyes at bedtime.    Yes Historical Provider, MD  metFORMIN (GLUCOPHAGE) 1000 MG tablet Take 1 tablet (1,000 mg total) by mouth 2 (two) times daily with a meal. 08/18/11 08/17/12 Yes Ok Anis, NP  metoprolol (LOPRESSOR) 50 MG tablet Take 50 mg by mouth 2 (two) times daily. 05/23/11 05/22/12 Yes Beatrice Lecher, PA  nitroGLYCERIN (NITROSTAT) 0.4 MG SL tablet Place 0.4 mg under the tongue every 5 (five) minutes as needed. As needed for chest pain   Yes Historical Provider, MD  NOVOLOG MIX 70/30 FLEXPEN (70-30) 100 UNIT/ML injection Inject 20-34 Units into the skin Twice daily. 34 units in the morning & 20 units in the evening 04/29/11  Yes Historical Provider, MD  potassium chloride (K-DUR,KLOR-CON) 10 MEQ tablet Take 10 mEq by mouth daily.   Yes Historical Provider, MD  warfarin (COUMADIN) 5 MG tablet Take 5-10 mg by mouth daily. Alternates days and takes 10 mg on Tuesdays, Thursdays & Saturday.  & 5 mg other days of the week. 11/26/10  Yes Historical Provider, MD    Allergies:  Allergies  Allergen Reactions  . Ace  Inhibitors Cough    Enalapril  benicor    . Crestor (Rosuvastatin Calcium)     Fatigue and leg pain   . Lipitor (Atorvastatin Calcium)     Myalgias   . Niaspan (Niacin)     Intol   . Olmesartan   . Prednisone     REACTION: "retained fluid"    Family History  Problem Relation Age of Onset  . Throat cancer Brother   . Prostate cancer Father   . Diabetes Mother   . Diabetes Brother     multiple  . Heart disease Brother     Social History:  reports that she has never smoked. She has never used smokeless tobacco. She reports that  she does not drink alcohol or use illicit drugs.  ROS: All systems reviewed with the patient and was positive as per HPI otherwise all other systems are negative.  PHYSICAL EXAM: Blood pressure 128/57, pulse 78, temperature 97.9 F (36.6 C), temperature source Oral, resp. rate 21, SpO2 95.00%. General: Well-developed well-nourished in no acute cardiopulmonary distress. HEENT: Normocephalic atraumatic. Pupils equal round react to light and accommodation. Extraocular movements intact. Oropharynx is clear no lesions no exudates neck is supple no lymphadenopathy. No JVD. No bruits, no goiter. Heart: Irregularly irregular, without murmurs, rubs, gallops. Lungs: Bibasilar crackles.  Abdomen: Soft, nontender, nondistended, positive bowel sounds. Extremities: No clubbing cyanosis. Trace bilateral lower extremity edema with positive pedal pulses. Neuro: Alert and oriented x3. Cranial nerves II through XII are grossly intact. No focal deficits. 5 out of 5 bilateral upper extremity strength. 5 bilateral lower extremity strength. Sensation is intact. Gait not tested secondary to safety.   EKG: A. fib with RVR with T-wave changes.  No results found for this or any previous visit (from the past 240 hour(s)).   Lab results:  Basename 08/31/11 1319  NA 134*  K 4.0  CL 96  CO2 24  GLUCOSE 302*  BUN 17  CREATININE 0.76  CALCIUM 10.5  MG --  PHOS --   No  results found for this basename: AST:2,ALT:2,ALKPHOS:2,BILITOT:2,PROT:2,ALBUMIN:2 in the last 72 hours No results found for this basename: LIPASE:2,AMYLASE:2 in the last 72 hours  Basename 08/31/11 1319  WBC 11.3*  NEUTROABS 8.9*  HGB 13.3  HCT 37.2  MCV 81.9  PLT 257    Basename 08/31/11 1320  CKTOTAL --  CKMB --  CKMBINDEX --  TROPONINI <0.30   No components found with this basename: POCBNP:3 No results found for this basename: DDIMER in the last 72 hours No results found for this basename: HGBA1C:2 in the last 72 hours No results found for this basename: CHOL:2,HDL:2,LDLCALC:2,TRIG:2,CHOLHDL:2,LDLDIRECT:2 in the last 72 hours No results found for this basename: TSH,T4TOTAL,FREET3,T3FREE,THYROIDAB in the last 72 hours No results found for this basename: VITAMINB12:2,FOLATE:2,FERRITIN:2,TIBC:2,IRON:2,RETICCTPCT:2 in the last 72 hours Imaging results:  Dg Chest 2 View  08/31/2011  *RADIOLOGY REPORT*  Clinical Data: Shortness of breath, chest pain, weakness, fatigue  CHEST - 2 VIEW  Comparison: Portable chest x-ray of 08/13/2011 and chest x-ray of 05/20/2011  Findings: No active infiltrate or effusion is seen.  Mild chronic interstitial change remains.  A calcified granuloma is again noted in the left lower lobe.  Mild cardiomegaly is stable.  The bones are osteopenic.  There is a partially compressed mid thoracic vertebral body which appears old.  IMPRESSION: Stable cardiomegaly and probable mild chronic interstitial change. No active process.  Original Report Authenticated By: Juline Patch, M.D.   Dg Chest Port 1 View  08/13/2011  *RADIOLOGY REPORT*  Clinical Data: Shortness of breath, pain  PORTABLE CHEST - 1 VIEW  Comparison: Chest x-ray of 05/20/2011  Findings: No active infiltrate or effusion is seen.  There is cardiomegaly present which is stable.  No acute bony abnormality is noted.  IMPRESSION: Stable cardiomegaly.  No active lung disease.  Original Report Authenticated By: Juline Patch, M.D.   Impression/Plan:  Principal Problem:  *A-fib Active Problems:  DIABETES MELLITUS  HYPERLIPIDEMIA  HYPERTENSION  CAD  CONGESTIVE HEART FAILURE (CHF)  OBSTRUCTIVE SLEEP APNEA  Chest pain, unspecified  Weakness generalized  Near syncope  Hypotension  Leukocytosis   #1 atrial fibrillation with RVR Patient's heart rate is ranging from 100-149 patient  is currently on 10 mics of diltiazem. Patient with recent non-ST elevated MI status post cath with stent placement to the circumflex. We'll cycle cardiac enzymes every 8 hours x3. Will check a TSH. Check a BNP. Will up titrate patient diltiazem to 15 mics. Will add IV Lopressor 5 mg every 8 hours. We'll continue patient's digoxin and metoprolol. We'll continue patient's Coumadin. We'll consult with cardiology for further evaluation and management.  #2 generalized weakness/near syncope Likely secondary to problem #1. Patient's systolic blood pressure is now in the 140s. See problem #1. Will check a CT of the head as patient states that her intermittent headaches over the past several years is slowly worsening.  #3 diabetes mellitus Hold oral hypoglycemics. Continue patient's NPH 70/30. Sliding scale insulin.  #4 hyperlipidemia Continue fish oil area  #5 coronary artery disease status post recent non-ST elevated MI 08/13/2011 status post drug-eluting stent/CHF Patient is has some complaints of nagging chest pain. We'll cycle cardiac enzymes every 8 hours x3. EKG with A. fib with RVR with some T-wave changes. Will check a TSH. Check a pro BNP. Patient's INR is therapeutic. Continue home regimen of Plavix, digoxin, Lasix, Inderal, metoprolol. We'll consult with cardiology for further evaluation and management.  #6 hypertension Continue home dose Lasix, Lopressor, digoxin.  #7 chest pain We'll cycle cardiac enzymes every 8 hours x3. Will check a TSH. Patient's INR is therapeutic and doubt if patient has a pulmonary embolus.  Cardiology consult is pending.  #8 hypotension transient Likely secondary to problem #1. Improved. We'll monitor for now.  #9 leukocytosis Likely reactive. Chest x-ray is negative. Urinalysis is pending. Follow.  #10 prophylaxis Protonix for GI prophylaxis. Patient on Coumadin.  Terrance Lanahan 08/31/2011, 6:25 PM

## 2011-08-31 NOTE — ED Notes (Signed)
Family at bedside. MD at bedside.

## 2011-08-31 NOTE — ED Notes (Addendum)
Pt. Maintaining HR less than 110.

## 2011-08-31 NOTE — ED Notes (Signed)
HR 104 

## 2011-09-01 ENCOUNTER — Other Ambulatory Visit: Payer: Self-pay

## 2011-09-01 ENCOUNTER — Encounter (INDEPENDENT_AMBULATORY_CARE_PROVIDER_SITE_OTHER): Payer: Medicare Other | Admitting: Ophthalmology

## 2011-09-01 LAB — CARDIAC PANEL(CRET KIN+CKTOT+MB+TROPI)
CK, MB: 2.6 ng/mL (ref 0.3–4.0)
CK, MB: 2.3 ng/mL (ref 0.3–4.0)
Relative Index: INVALID (ref 0.0–2.5)
Total CK: 25 U/L (ref 7–177)
Troponin I: <0.3 ng/mL (ref <0.30)
Troponin I: <0.3 ng/mL (ref <0.30)

## 2011-09-01 LAB — COMPREHENSIVE METABOLIC PANEL
ALT: 9 U/L (ref 0–35)
AST: 11 U/L (ref 0–37)
Albumin: 2.6 g/dL — ABNORMAL LOW (ref 3.5–5.2)
CO2: 25 mEq/L (ref 19–32)
Chloride: 97 mEq/L (ref 96–112)
GFR calc non Af Amer: 83 mL/min — ABNORMAL LOW (ref >90)
Potassium: 3.8 mEq/L (ref 3.5–5.1)
Sodium: 132 mEq/L — ABNORMAL LOW (ref 135–145)
Total Bilirubin: 0.4 mg/dL (ref 0.3–1.2)

## 2011-09-01 LAB — GLUCOSE, CAPILLARY
Glucose-Capillary: 277 mg/dL — ABNORMAL HIGH (ref 70–99)
Glucose-Capillary: 159 mg/dL — ABNORMAL HIGH (ref 70–99)

## 2011-09-01 LAB — BASIC METABOLIC PANEL
BUN: 11 mg/dL (ref 6–23)
BUN: 11 mg/dL (ref 6–23)
CO2: 28 mEq/L (ref 19–32)
Chloride: 97 mEq/L (ref 96–112)
Chloride: 95 mEq/L — ABNORMAL LOW (ref 96–112)
Creatinine, Ser: 0.56 mg/dL (ref 0.50–1.10)
Creatinine, Ser: 0.6 mg/dL (ref 0.50–1.10)
Glucose, Bld: 328 mg/dL — ABNORMAL HIGH (ref 70–99)
Potassium: 3.8 mEq/L (ref 3.5–5.1)

## 2011-09-01 LAB — URINE MICROSCOPIC-ADD ON

## 2011-09-01 LAB — URINALYSIS, ROUTINE W REFLEX MICROSCOPIC
Glucose, UA: 100 mg/dL — AB
Hgb urine dipstick: NEGATIVE
Protein, ur: 30 mg/dL — AB
pH: 7 (ref 5.0–8.0)

## 2011-09-01 LAB — CBC
Platelets: 244 10*3/uL (ref 150–400)
RBC: 3.97 MIL/uL (ref 3.87–5.11)
RDW: 13.9 % (ref 11.5–15.5)
WBC: 9.9 10*3/uL (ref 4.0–10.5)

## 2011-09-01 LAB — URINE CULTURE: Colony Count: NO GROWTH

## 2011-09-01 MED ORDER — SODIUM CHLORIDE 0.9 % IJ SOLN: 3.0000 mL | Freq: Two times a day (BID) | INTRAMUSCULAR | Status: DC

## 2011-09-01 MED ORDER — DILTIAZEM HCL ER COATED BEADS 360 MG PO CP24
360.0000 mg | ORAL_CAPSULE | Freq: Every day | ORAL | Status: DC
  Administered 2011-09-02 – 2011-09-05 (×4): 360 mg via ORAL
  Filled 2011-09-01 (×4): qty 1

## 2011-09-01 MED ORDER — INSULIN ASPART PROT & ASPART (70-30 MIX) 100 UNIT/ML ~~LOC~~ SUSP
20.0000 [IU] | Freq: Every day | SUBCUTANEOUS | Status: DC
  Administered 2011-09-01 – 2011-09-05 (×5): 20 [IU] via SUBCUTANEOUS
  Filled 2011-09-01: qty 3

## 2011-09-01 MED ORDER — MAGNESIUM SULFATE 40 MG/ML IJ SOLN
2.0000 g | Freq: Once | INTRAMUSCULAR | Status: AC
  Administered 2011-09-01: 2 g via INTRAVENOUS
  Filled 2011-09-01: qty 50

## 2011-09-01 MED ORDER — INSULIN ASPART 100 UNIT/ML ~~LOC~~ SOLN
0.0000 [IU] | Freq: Every day | SUBCUTANEOUS | Status: DC
  Filled 2011-09-01: qty 3

## 2011-09-01 MED ORDER — INSULIN ASPART PROT & ASPART (70-30 MIX) 100 UNIT/ML ~~LOC~~ SUSP
34.0000 [IU] | Freq: Every day | SUBCUTANEOUS | Status: DC
  Administered 2011-09-02 – 2011-09-05 (×4): 34 [IU] via SUBCUTANEOUS
  Filled 2011-09-01: qty 3

## 2011-09-01 MED ORDER — CLOPIDOGREL BISULFATE 75 MG PO TABS
75.0000 mg | ORAL_TABLET | Freq: Every day | ORAL | Status: DC
  Administered 2011-09-01 – 2011-09-06 (×6): 75 mg via ORAL
  Filled 2011-09-01 (×6): qty 1

## 2011-09-01 MED ORDER — SODIUM CHLORIDE 0.9 % IV SOLN: 250.0000 mL | INTRAVENOUS | Status: DC | PRN

## 2011-09-01 MED ORDER — DILTIAZEM HCL ER COATED BEADS 240 MG PO CP24
240.0000 mg | ORAL_CAPSULE | Freq: Every day | ORAL | Status: DC
  Administered 2011-09-01: 240 mg via ORAL
  Filled 2011-09-01: qty 1

## 2011-09-01 MED ORDER — POTASSIUM CHLORIDE CRYS ER 20 MEQ PO TBCR
40.0000 meq | EXTENDED_RELEASE_TABLET | ORAL | Status: AC
  Administered 2011-09-01: 40 meq via ORAL
  Filled 2011-09-01: qty 2

## 2011-09-01 MED ORDER — INSULIN ASPART 100 UNIT/ML ~~LOC~~ SOLN
4.0000 [IU] | Freq: Three times a day (TID) | SUBCUTANEOUS | Status: DC
  Administered 2011-09-01 – 2011-09-02 (×3): 4 [IU] via SUBCUTANEOUS

## 2011-09-01 MED ORDER — POTASSIUM CHLORIDE CRYS ER 20 MEQ PO TBCR
40.0000 meq | EXTENDED_RELEASE_TABLET | Freq: Once | ORAL | Status: AC
  Administered 2011-09-01: 40 meq via ORAL
  Filled 2011-09-01: qty 2

## 2011-09-01 MED ORDER — PHENAZOPYRIDINE HCL 100 MG PO TABS
100.0000 mg | ORAL_TABLET | Freq: Three times a day (TID) | ORAL | Status: AC
  Administered 2011-09-02 (×3): 100 mg via ORAL
  Filled 2011-09-01 (×3): qty 1

## 2011-09-01 MED ORDER — DOFETILIDE 250 MCG PO CAPS: 250.0000 ug | ORAL_CAPSULE | Freq: Two times a day (BID) | ORAL | Status: DC

## 2011-09-01 MED ORDER — DOFETILIDE 250 MCG PO CAPS
250.0000 ug | ORAL_CAPSULE | Freq: Two times a day (BID) | ORAL | Status: DC
  Administered 2011-09-01 – 2011-09-05 (×7): 250 ug via ORAL
  Filled 2011-09-01 (×9): qty 1

## 2011-09-01 MED ORDER — SODIUM CHLORIDE 0.9 % IJ SOLN: 3.0000 mL | INTRAMUSCULAR | Status: DC | PRN

## 2011-09-01 MED ORDER — POTASSIUM CHLORIDE CRYS ER 20 MEQ PO TBCR
40.0000 meq | EXTENDED_RELEASE_TABLET | Freq: Every day | ORAL | Status: DC
  Administered 2011-09-02 – 2011-09-05 (×4): 40 meq via ORAL
  Filled 2011-09-01 (×4): qty 2

## 2011-09-01 MED ORDER — INSULIN ASPART 100 UNIT/ML ~~LOC~~ SOLN
0.0000 [IU] | Freq: Three times a day (TID) | SUBCUTANEOUS | Status: DC
  Administered 2011-09-01: 11 [IU] via SUBCUTANEOUS
  Administered 2011-09-02: 15 [IU] via SUBCUTANEOUS
  Administered 2011-09-02: 11 [IU] via SUBCUTANEOUS
  Administered 2011-09-02: 3 [IU] via SUBCUTANEOUS
  Administered 2011-09-03: 7 [IU] via SUBCUTANEOUS
  Administered 2011-09-03 – 2011-09-04 (×3): 4 [IU] via SUBCUTANEOUS
  Administered 2011-09-04: 11 [IU] via SUBCUTANEOUS
  Administered 2011-09-05 – 2011-09-06 (×3): 7 [IU] via SUBCUTANEOUS
  Administered 2011-09-06: 20 [IU] via SUBCUTANEOUS

## 2011-09-01 NOTE — Progress Notes (Signed)
Patient's new med, Tikosyn, was not started this morning per Pharmacist, Anh d/t K+ of < 4.0 and low Magnesium level.  She stated that she will call Dr. Myrtis Ser.   Tikosyn started this evening as ordered, QTc of 0.47 and patient is due for EKG at 2130 (9:30 pm).   Noted that patient has been shivering, temp checked, 99.3 orally, Tylenol given, temp rechecked after an hour, 99.0.  Patient's insulin order verified with Dr. Sharon Seller and MD stated to give insulin due to high blood sugar.

## 2011-09-01 NOTE — Progress Notes (Signed)
[facility] [place] TEAM 1  Subjective: 78 year old female with a history of atrial fibrillation on chronic Coumadin therapy, history of chronic diastolic heart failure, history of coronary artery disease status post non-ST elevated MI 08/12/2001 status post cardiac catheterization with 99% proximal circumflex stenosis status post resolute integrity drug eluding stent with a EF of 65%, history of diabetes mellitus, history of hyperlipidemia who presents to the ED with generalized weakness and near syncope.  Ms. Watson is feeling much better now that she is in NSR.  She denies f/c, sob, n/v, or abdom pain.    Objective: Weight change:   Intake/Output Summary (Last 24 hours) at 09/01/11 1322 Last data filed at 09/01/11 1200  Gross per 24 hour  Intake    380 ml  Output   1100 ml  Net   -720 ml   Blood pressure 140/44, pulse 68, temperature 98.5 F (36.9 C), temperature source Oral, resp. rate 17, height 5\' 4"  (1.626 m), weight 71.5 kg (157 lb 10.1 oz), SpO2 95.00%.  Physical Exam: General: No acute respiratory distress Lungs: Clear to auscultation bilaterally without wheezes or crackles Cardiovascular: Regular rate and rhythm without murmur gallop or rub  Abdomen: Nontender, nondistended, soft, bowel sounds positive, no rebound, no ascites, no appreciable mass Extremities: No significant cyanosis, clubbing, or edema bilateral lower extremities  Lab Results:  Basename 09/01/11 0920 09/01/11 0400 08/31/11 2001 08/31/11 1319  NA 133* 132* -- 134*  K 3.8 3.8 -- 4.0  CL 97 97 -- 96  CO2 28 25 -- 24  GLUCOSE 328* 317* -- 302*  BUN 11 14 -- 17  CREATININE 0.56 0.63 -- 0.76  CALCIUM 10.1 9.9 -- 10.5  MG 1.7 -- 1.2* --  PHOS -- -- -- --    Basename 09/01/11 0400  AST 11  ALT 9  ALKPHOS 72  BILITOT 0.4  PROT 6.5  ALBUMIN 2.6*    Basename 09/01/11 0400 08/31/11 1319  WBC 9.9 11.3*  NEUTROABS -- 8.9*  HGB 11.3* 13.3  HCT 32.9* 37.2  MCV 82.9 81.9  PLT 244 257     Basename 09/01/11 0920 09/01/11 0400 08/31/11 2001  CKTOTAL 24 25 24   CKMB 2.3 2.6 2.3  CKMBINDEX -- -- --  TROPONINI <0.30 <0.30 <0.30    Micro Results: Recent Results (from the past 240 hour(s))  MRSA PCR SCREENING     Status: Normal   Collection Time   08/31/11  7:33 PM      Component Value Range Status Comment   MRSA by PCR NEGATIVE  NEGATIVE  Final     Studies/Results: All recent x-ray/radiology reports have been reviewed in detail.   Medications: I have reviewed the patient's complete medication list.  Assessment/Plan:  afib on chronic coumadin with acute symptomatic RVR [facility] is following with [place] - they have started [place] - she is in the process of having her hypomagnesemia corrected - iv cardizem has been stopped in favor of oral dosing, and digoxin will need to be followed closely  presyncope Felt to be due to afib w/ RVR  HTN Control not yet optimal - will adjust tx plan and follow  DM Uncontrolled - adjust tx and follow  Essential tremor Stable  B12 deficiency F/u level ordered  CAD s/p NTEMI w/ PTCA/DES 08/13/2011 No chest pain   Pulmonary fibrosis  Diastolic CHF Well compensated at present - control BP  Lonia Blood, MD [facility] Office  [phone] Pager [phone]  On-Call/Text Page:  [email or web address]      password [email or web address]

## 2011-09-01 NOTE — Progress Notes (Signed)
ANTICOAGULATION CONSULT NOTE - Initial Consult  Pharmacy Consult for coumadin Indication: afib  Allergies  Allergen Reactions  . Ace Inhibitors Cough    Enalapril  benicor    . Crestor (Rosuvastatin Calcium)     Fatigue and leg pain   . Lipitor (Atorvastatin Calcium)     Myalgias   . Niaspan (Niacin)     Intol   . Olmesartan   . Prednisone     REACTION: "retained fluid"    Patient Measurements: Wt=70.8kg Ht= 64 inches  Vital Signs: Temp: 99.5 F (37.5 C) (03/06 0700) Temp src: Oral (03/06 0700) BP: 161/54 mmHg (03/06 0700) Pulse Rate: 80  (03/06 0700)  Labs:  Basename 09/01/11 0400 08/31/11 2001 08/31/11 1320 08/31/11 1319  HGB 11.3* -- -- 13.3  HCT 32.9* -- -- 37.2  PLT 244 -- -- 257  APTT 51* -- -- --  LABPROT 26.6* -- -- 25.4*  INR 2.41* -- -- 2.27*  HEPARINUNFRC -- -- -- --  CREATININE 0.63 -- -- 0.76  CKTOTAL 25 24 -- --  CKMB 2.6 2.3 -- --  TROPONINI <0.30 <0.30 <0.30 --   Estimated Creatinine Clearance: 55.3 ml/min (by C-G formula based on Cr of 0.63).  Medical History: Past Medical History  Diagnosis Date  . Hypertension     with h/o hypertensive urgency  . Glaucoma   . Tremor, essential   . Diabetes mellitus with neuropathy   . B12 deficiency   . Hyperlipidemia   . Atrial fibrillation     a. on chronic coumadin  . Chronic diastolic heart failure     echo 05/2009: mild LVH, EF 55-60%, grade 2 diast dysfxn, mild LAE, PASP  . CAD (coronary artery disease)     a.  LHC 06/2009: pLAD 40%, prox-mid D1 80% (small);  CFX 40-50%, mRCA 20%, EF 65%  -  med Rx;  b.  05/23/11 Myoview - EF 53%, no isch/inf;  c. 07/2011 NSTEMI - PCI/DES LCX - Resolute stent  . Carotid stenosis     dopplers 6/12: 1-39% bilat ICA  . Pulmonary fibrosis     Dr. Vassie Loll;  patient taken off O2 in 8/12; dyspnea felt CHF>>ILD  . Skin cancer   . Shortness of breath   . Atrial fibrillation   . A-fib 08/31/2011  . Warfarin anticoagulation    Coumadin home regmien 10 mg on  Tuesdays, Thursdays & Saturday. & 5 mg other days of the week (last dose 08/30/11)  Assessment: 78 yo female here with weakness. She has a history of afib and is on coumadin at home. INR therapeutic on home dose. Afib converted to NSR.   Goal of Therapy:  INR 2-3   Plan:  -Will Continue coumadin as taken at home -INR daily  Ulyses Southward Hayden 09/01/2011,8:36 AM

## 2011-09-01 NOTE — Progress Notes (Signed)
Cardiology Progress Note Patient Name: Alexandria [name] Date of Encounter: 09/01/2011, 7:00 AM     Subjective  Patient converted from A. Fib to NSR around 0020 this morning. She denies chest pain, shortness of breath, or palpitations. Does have a dull headache.   Objective   Telemetry: A. Fib to NSR around 0020 this morning. Now 1st degree AV Block 70s-80s w/ PACs  Medications: . clopidogrel  75 mg Oral STAT  . colesevelam  1,250 mg Oral Daily  . digoxin  125 mcg Oral Daily  . diltiazem  10 mg Intravenous Once  . furosemide  80 mg Oral BID  . insulin aspart  0-15 Units Subcutaneous TID WC  . insulin aspart protamine-insulin aspart  20-34 Units Subcutaneous BID WC  . isosorbide mononitrate  30 mg Oral Daily  . latanoprost  1 drop Both Eyes QHS  . metoprolol  50 mg Oral BID  . omega-3 acid ethyl esters  1 g Oral Daily  . pantoprazole  40 mg Oral Q1200  . potassium chloride  10 mEq Oral Daily  . sodium chloride  500 mL Intravenous Once  . sodium chloride  500 mL Intravenous Once  . sodium chloride  3 mL Intravenous Q12H  . sodium chloride  3 mL Intravenous Q12H  . warfarin  10 mg Oral NOW  . warfarin  10 mg Oral Custom  . warfarin  5 mg Oral Custom  . Warfarin - Pharmacist Dosing Inpatient   Does not apply q1800   . diltiazem (CARDIZEM) infusion 10 mg/hr (08/31/11 1610)    Physical Exam: Temp:  [97.9 F (36.6 C)-98.4 F (36.9 C)] 98.4 F (36.9 C) (03/05 2336) Pulse Rate:  [61-140] 76  (03/06 0600) Resp:  [16-32] 32  (03/06 0600) BP: (79-175)/(49-115) 158/57 mmHg (03/06 0600) SpO2:  [85 %-99 %] 97 % (03/06 0600) Weight:  [157 lb 10.1 oz (71.5 kg)] 157 lb 10.1 oz (71.5 kg) (03/05 2000)  General: Overweight elderly female, in no acute distress. Head: Normocephalic, atraumatic, sclera non-icteric, nares are without discharge.  Neck: Supple. Negative for JVD Lungs: Rales Right lower lobe, otherwise No wheezes or rhonchi. Breathing is unlabored. Heart: RRR S1 S2 2/6  crescendo decrescendo systolic murmur RUSB; No rubs or gallops.  Abdomen: Soft, non-tender, non-distended with normoactive bowel sounds. No rebound/guarding. No obvious abdominal masses. Msk:  Strength and tone appear normal for age. Extremities: No edema. No clubbing or cyanosis. Distal pedal pulses are intact and equal bilaterally. Neuro: Alert and oriented X 3. Moves all extremities spontaneously. Psych:  Responds to questions appropriately with a normal affect.   Intake/Output Summary (Last 24 hours) at 09/01/11 0701 Last data filed at 09/01/11 0500  Gross per 24 hour  Intake      0 ml  Output    650 ml  Net   -650 ml    Labs:  Basename 09/01/11 0400 08/31/11 2001 08/31/11 1319  NA 132* -- 134*  K 3.8 -- 4.0  CL 97 -- 96  CO2 25 -- 24  GLUCOSE 317* -- 302*  BUN 14 -- 17  CREATININE 0.63 -- 0.76  CALCIUM 9.9 -- 10.5  MG -- 1.2* --   Basename 09/01/11 0400  AST 11  ALT 9  ALKPHOS 72  BILITOT 0.4  PROT 6.5  ALBUMIN 2.6*   Basename 09/01/11 0400 08/31/11 1319  WBC 9.9 11.3*  NEUTROABS -- 8.9*  HGB 11.3* 13.3  HCT 32.9* 37.2  MCV 82.9 81.9  PLT  244 257   Basename 09/01/11 0400 08/31/11 2001 08/31/11 1320  CKTOTAL 25 24 --  CKMB 2.6 2.3 --  TROPONINI <0.30 <0.30 <0.30   Basename 08/31/11 2001  TSH 2.594     Radiology/Studies:   08/31/2011 - CXR   Findings: No active infiltrate or effusion is seen.  Mild chronic interstitial change remains.  A calcified granuloma is again noted in the left lower lobe.  Mild cardiomegaly is stable.  The bones are osteopenic.  There is a partially compressed mid thoracic vertebral body which appears old.  IMPRESSION: Stable cardiomegaly and probable mild chronic interstitial change. No active process.    08/31/2011 - CT Head  Findings: There is no acute intracranial hemorrhage, infarction, or mass.  The patient has very extensive periventricular white matter lucency consistent with chronic small vessel ischemic disease. There is  also diffuse atrophy with secondary mild ventricular dilatation.  No acute osseous abnormality.  IMPRESSION: No acute abnormalities.  Atrophy and chronic small vessel ischemic changes as described.     Assessment and Plan  78 y.o. female w/ PMHx significant for DMII, CAD, NSTEMI s/p DES Feb '13, Chronic diastolic CHF, Pulmonary Fibrosis, and A. Fib w/ RVR who presented to [facility] on 08/31/11 with complaints of generalized weakness and near syncope and was found to be in A. Fib w/ RVR.  1. A. Fib w/ RVR: Her presenting symptoms were likely related to her rapid a. Fib which she does not tolerate well. She is anticoagulated with coumadin. She has a history of pulmonary fibrosis and therefore Amiodarone may not be the best antiarrhythmic for her. She converted to Sinus rhythm w/ 1st degree AV block over night. She is felling well this morning. She needs to ambulate with assistance today. Cardizem drip at 15mg /hr, was on 420mg  daily at home. Consider transitioning to oral today. Further plans per MD.  2. Hypomagnesemia: Mg 1.2 last evening. Will replete this morning with 2g MgSulfate  3. Hypertension: SBPs have been 140s-170s overnight. Consider uptitration of home meds or addition of another agent.  4. CAD: Coronary disease is stable after the patient's recent stent. She is on Plavix along with Coumadin. Aspirin was stopped after one week. No anginal symptoms. Cardiac enzymes negative.   5. Chronic Diastolic CHF: No signs of volume overload. Cont current med regimen.  6. Diabetes Mellitus, Type 2: Glucoses elevated in the 300s. Management per primary team  Signed, HOPE, JESSICA PA-C  Patient seen and examined. I agree with the assessment and plan as detailed above. See also my additional thoughts below.   Please refer to the complete note that I have written today also.  Willa Rough, MD, [facility] [address] 09/01/2011 9:10 AM

## 2011-09-01 NOTE — Evaluation (Signed)
Physical Therapy Evaluation Patient Details Name: Alexandria [name] MRN: [id number] DOB: 07-Dec-1929 Today's Date: 09/01/2011  Problem List:  Patient Active Problem List  Diagnoses  . DIABETES MELLITUS  . HYPERLIPIDEMIA  . OBESITY  . HYPERTENSION  . CAD  . Atrial fibrillation  . PREMATURE ATRIAL CONTRACTIONS  . CONGESTIVE HEART FAILURE (CHF)  . PULMONARY FIBROSIS  . OBSTRUCTIVE SLEEP APNEA  . EDEMA  . DYSPNEA  . COLONIC POLYPS, ADENOMATOUS, HX OF  . Acute on chronic diastolic heart failure  . Chest pain, unspecified  . NSTEMI (non-ST elevated myocardial infarction)  . UTI (lower urinary tract infection)  . Weakness generalized  . Near syncope  . A-fib  . Hypotension  . Leukocytosis  . Warfarin anticoagulation  . Hypomagnesemia    Past Medical History:  Past Medical History  Diagnosis Date  . Hypertension     with h/o hypertensive urgency  . Glaucoma   . Tremor, essential   . Diabetes mellitus with neuropathy   . B12 deficiency   . Hyperlipidemia   . Atrial fibrillation     a. on chronic coumadin  . Chronic diastolic heart failure     echo 05/2009: mild LVH, EF 55-60%, grade 2 diast dysfxn, mild LAE, PASP  . CAD (coronary artery disease)     a.  LHC 06/2009: pLAD 40%, prox-mid D1 80% (small);  CFX 40-50%, mRCA 20%, EF 65%  -  med Rx;  b.  05/23/11 Myoview - EF 53%, no isch/inf;  c. 07/2011 NSTEMI - PCI/DES LCX - Resolute stent  . Carotid stenosis     dopplers 6/12: 1-39% bilat ICA  . Pulmonary fibrosis     Dr. Vassie Loll;  patient taken off O2 in 8/12; dyspnea felt CHF>>ILD  . Skin cancer   . Shortness of breath   . Atrial fibrillation   . A-fib 08/31/2011  . Warfarin anticoagulation    Past Surgical History:  Past Surgical History  Procedure Date  . Appendectomy   . Nose surgery   . Cardiac catheterization     PT Assessment/Plan/Recommendation PT Assessment Clinical Impression Statement: Clinical Impression Statement: Pt admitted for chest pain with EKG showing A.  fib with a RVR with some T-wave changes. Pt simultaneously converted back to sinus rhythm on morning of 3/6. Reportedly pt with orthostatic BP on 3/5, but this not seen during eval on 3/6. Pt did however demonstrate dizziness with ambulation and sitting up. See OT note for BP changes during our eval.  PT eval complete (co-eval with OT) and pt demonstrating decreased activity tolerance and balance deficits affecting her safety at home. Will benefit physical therapy in the acute setting to maximize mobility, safety and independence at home as pt adamantly against going to any facility. Rec HHPT for f/u with initial 24 hour assist as pt still minA for ambulation today.  PT Recommendation/Assessment: Patient will need skilled PT in the acute care venue PT Problem List: Decreased activity tolerance;Decreased balance;Decreased mobility;Decreased safety awareness;Cardiopulmonary status limiting activity;Decreased knowledge of use of DME Barriers to Discharge: Decreased caregiver support PT Therapy Diagnosis : Difficulty walking;Abnormality of gait;Generalized weakness PT Plan PT Frequency: Min 3X/week PT Treatment/Interventions: DME instruction;Gait training;Functional mobility training;Stair training;Therapeutic activities;Balance training;Therapeutic exercise;Neuromuscular re-education;Cognitive remediation;Patient/family education PT Recommendation Follow Up Recommendations: Home health PT;Supervision/Assistance - 24 hour Equipment Recommended: None recommended by PT PT Goals  Acute Rehab PT Goals PT Goal Formulation: With patient Time For Goal Achievement: 2 weeks Pt will go Sit to Stand: with modified independence PT Goal: Sit to  Stand - Progress: Goal set today Pt will go Stand to Sit: with modified independence PT Goal: Stand to Sit - Progress: Goal set today Pt will Stand: with modified independence PT Goal: Stand - Progress: Goal set today Pt will Ambulate: >150 feet;with least restrictive  assistive device PT Goal: Ambulate - Progress: Goal set today Pt will Go Up / Down Stairs: 1-2 stairs;with modified independence;with rail(s);with least restrictive assistive device PT Goal: Up/Down Stairs - Progress: Goal set today Pt will Perform Home Exercise Program: Independently PT Goal: Perform Home Exercise Program - Progress: Goal set today  PT Evaluation Precautions/Restrictions  Precautions Precautions: Fall Precaution Comments: dizziness upon standing and with ambulation. neg for orthostatic BP Restrictions Weight Bearing Restrictions: No Prior Functioning  Home Living Lives With: Alone Type of Home: House Home Layout: One level Home Access: Stairs to enter Entrance Stairs-Rails: Right Entrance Stairs-Number of Steps: 1 Bathroom Shower/Tub: Walk-in shower;Tub/shower unit Bathroom Toilet: Standard Home Adaptive Equipment: Walker - rolling;Straight cane;Bedside commode/3-in-1 Additional Comments: Pt states she can get "people" to come check on her, but is resistant to 24 care/supervision   Cognition Cognition Arousal/Alertness: Awake/alert Overall Cognitive Status: Impaired Safety/Judgement: Decreased awareness of safety precautions;Good safety judgement for tasks assessed Decreased Safety/Judgement: Decreased awareness of need for assistance Safety/Judgement - Other Comments: when asked about d/c plans pt reports she is going home no matter what although pt minA for ambulation  Sensation/Coordination Sensation Light Touch: Appears Intact Coordination Gross Motor Movements are Fluid and Coordinated: Yes Fine Motor Movements are Fluid and Coordinated: Yes Extremity Assessment RUE Assessment RUE Assessment:  (see OT note) LUE Assessment LUE Assessment:  (see OT note) RLE Assessment RLE Assessment: Within Functional Limits (fatigues quickly) LLE Assessment LLE Assessment: Within Functional Limits (fatigues quickly) Mobility (including Balance) Bed Mobility Bed  Mobility: Yes Supine to Sit: 6: Modified independent (Device/Increase time) Transfers Transfers: Yes Sit to Stand: 4: Min assist;With upper extremity assist;From bed Sit to Stand Details (indicate cue type and reason): min facilitation to steady pt  Stand to Sit: 4: Min assist;To chair/3-in-1;With armrests;With upper extremity assist Stand to Sit Details: minA to control descent to chair Ambulation/Gait Ambulation/Gait: Yes Ambulation/Gait Assistance: 4: Min assist Ambulation/Gait Assistance Details (indicate cue type and reason): amb. with slow shuffled gait, almost cautiously but with complaints of dizziness and pt reaching for support on the wall with RUE; minA for stability; as she ambulated further it was obvious the pt was not feeling well, she almost became slightly floppy and flexed foward so we had her sit for safety; BP appeared to be rising, see OT note for details, RN aware; pt with headache Ambulation Distance (Feet): 50 Feet Assistive device: None  Posture/Postural Control Posture/Postural Control: No significant limitations Balance Balance Assessed: Yes Static Standing Balance Static Standing - Balance Support: No upper extremity supported Static Standing - Level of Assistance: 4: Min assist Exercise    End of Session PT - End of Session Equipment Utilized During Treatment: Gait belt Activity Tolerance: Patient limited by fatigue Patient left: in chair;with call bell in reach Nurse Communication: Mobility status for transfers;Mobility status for ambulation (pt's BP elevated with position and ambulation (160s/60s) ) General Behavior During Session: [facility] for tasks performed Cognition: Impaired  WHITLOW,Jaretzy Lhommedieu HELEN 09/01/2011, 4:17 PM

## 2011-09-01 NOTE — Progress Notes (Addendum)
Patient ID: Alexandria [name], female   DOB: May 18, 1930, 78 y.o.   MRN: [id number] SUBJECTIVE: An in patient is feeling better today. She spontaneously converted to sinus rhythm during the evening.I have spoken with Dr. Antoine Poche. He knows the patient well and he is concerned about her pulmonary status. Because of this amiodarone will not be used. We need to keep her in sinus rhythm. He and I have decided to start Tikosyn. However the patient's magnesium was low on admission. She is receiving magnesium. This will have to be normalized before her Tikosyn can be started.   Filed Vitals:   09/01/11 0400 09/01/11 0500 09/01/11 0600 09/01/11 0700  BP: 161/51 156/63 158/57 161/54  Pulse: 77 78 76 80  Temp:    99.5 F (37.5 C)  TempSrc:    Oral  Resp: 26 26 32 33  Height:      Weight:      SpO2: 85% 97% 97% 97%    Intake/Output Summary (Last 24 hours) at 09/01/11 0901 Last data filed at 09/01/11 0800  Gross per 24 hour  Intake     20 ml  Output    650 ml  Net   -630 ml    LABS: Basic Metabolic Panel:  Basename 09/01/11 0400 08/31/11 2001 08/31/11 1319  NA 132* -- 134*  K 3.8 -- 4.0  CL 97 -- 96  CO2 25 -- 24  GLUCOSE 317* -- 302*  BUN 14 -- 17  CREATININE 0.63 -- 0.76  CALCIUM 9.9 -- 10.5  MG -- 1.2* --  PHOS -- -- --   Liver Function Tests:  Basename 09/01/11 0400  AST 11  ALT 9  ALKPHOS 72  BILITOT 0.4  PROT 6.5  ALBUMIN 2.6*   No results found for this basename: LIPASE:2,AMYLASE:2 in the last 72 hours CBC:  Basename 09/01/11 0400 08/31/11 1319  WBC 9.9 11.3*  NEUTROABS -- 8.9*  HGB 11.3* 13.3  HCT 32.9* 37.2  MCV 82.9 81.9  PLT 244 257   Cardiac Enzymes:  Basename 09/01/11 0400 08/31/11 2001 08/31/11 1320  CKTOTAL 25 24 --  CKMB 2.6 2.3 --  CKMBINDEX -- -- --  TROPONINI <0.30 <0.30 <0.30   BNP: No components found with this basename: POCBNP:3 D-Dimer: No results found for this basename: DDIMER:2 in the last 72 hours Hemoglobin A1C: No results found for  this basename: HGBA1C in the last 72 hours Fasting Lipid Panel: No results found for this basename: CHOL,HDL,LDLCALC,TRIG,CHOLHDL,LDLDIRECT in the last 72 hours Thyroid Function Tests:  Basename 08/31/11 2001  TSH 2.594  T4TOTAL --  T3FREE --  THYROIDAB --    RADIOLOGY: Dg Chest 2 View  08/31/2011  *RADIOLOGY REPORT*  Clinical Data: Shortness of breath, chest pain, weakness, fatigue  CHEST - 2 VIEW  Comparison: Portable chest x-ray of 08/13/2011 and chest x-ray of 05/20/2011  Findings: No active infiltrate or effusion is seen.  Mild chronic interstitial change remains.  A calcified granuloma is again noted in the left lower lobe.  Mild cardiomegaly is stable.  The bones are osteopenic.  There is a partially compressed mid thoracic vertebral body which appears old.  IMPRESSION: Stable cardiomegaly and probable mild chronic interstitial change. No active process.  Original Report Authenticated By: Juline Patch, M.D.   Ct Head Wo Contrast  08/31/2011  *RADIOLOGY REPORT*  Clinical Data: Headache and dizziness.  CT HEAD WITHOUT CONTRAST  Technique:  Contiguous axial images were obtained from the base of the skull through the vertex without contrast.  Comparison: None.  Findings: There is no acute intracranial hemorrhage, infarction, or mass.  The patient has very extensive periventricular white matter lucency consistent with chronic small vessel ischemic disease. There is also diffuse atrophy with secondary mild ventricular dilatation.  No acute osseous abnormality.  IMPRESSION: No acute abnormalities.  Atrophy and chronic small vessel ischemic changes as described.  Original Report Authenticated By: Gwynn Burly, M.D.   Dg Chest Port 1 View  08/13/2011  *RADIOLOGY REPORT*  Clinical Data: Shortness of breath, pain  PORTABLE CHEST - 1 VIEW  Comparison: Chest x-ray of 05/20/2011  Findings: No active infiltrate or effusion is seen.  There is cardiomegaly present which is stable.  No acute bony  abnormality is noted.  IMPRESSION: Stable cardiomegaly.  No active lung disease.  Original Report Authenticated By: Juline Patch, M.D.    PHYSICAL EXAM  Patient is stable. She is oriented to person time and place. Affect is normal. There is no jugular venous distention. Lungs reveal some chronic rales. Cardiac exam reveals S1 and S2 with a soft systolic murmur. The rhythm is regular. The abdomen is soft. There is no peripheral edema.   TELEMETRY:    I personally reviewed telemetry. There is normal sinus rhythm at this time.   ASSESSMENT AND PLAN:  *A-fib      The patient has converted to sinus rhythm. As noted, I have decided to start Tikosyn. I have completed the order set. However the patient's magnesium is low. The patient's magnesium will have to be normalized before Tikosyn can be started.We will then follow the protocol. She will have to receive at least 5 doses of the medication in the hospital with careful attention to her QT interval before she can be considered for discharge.In addition the patient is on digoxin. Digoxin level will be ordered stat. Tikosyn cannot be started until we are sure that level is normal. IV Cardizem also will be stopped and the patient switched back to by mouth.   CAD    Coronary disease is stable. There is no evidence of an MI this admission.   CONGESTIVE HEART FAILURE (CHF)    There has been some diastolic heart failure in the past. She does not appear to be volume overloaded this time.   PULMONARY FIBROSIS     The patient has known pulmonary fibrosis. Amiodarone will not be used on this basis.   Warfarin anticoagulation       Warfarin is being continued.  Willa Rough 09/01/2011 9:01 AM

## 2011-09-01 NOTE — Plan of Care (Signed)
Problem: Phase I Progression Outcomes Goal: Heart rate or rhythm control medication Outcome: Progressing Patient is started on Tikosyn

## 2011-09-01 NOTE — Evaluation (Signed)
Occupational Therapy Evaluation Patient Details Name: Alexandria [name] MRN: [id number] DOB: 09-Jun-1930 Today's Date: 09/01/2011  Problem List:  Patient Active Problem List  Diagnoses  . DIABETES MELLITUS  . HYPERLIPIDEMIA  . OBESITY  . HYPERTENSION  . CAD  . Atrial fibrillation  . PREMATURE ATRIAL CONTRACTIONS  . CONGESTIVE HEART FAILURE (CHF)  . PULMONARY FIBROSIS  . OBSTRUCTIVE SLEEP APNEA  . EDEMA  . DYSPNEA  . COLONIC POLYPS, ADENOMATOUS, HX OF  . Acute on chronic diastolic heart failure  . Chest pain, unspecified  . NSTEMI (non-ST elevated myocardial infarction)  . UTI (lower urinary tract infection)  . Weakness generalized  . Near syncope  . A-fib  . Hypotension  . Leukocytosis  . Warfarin anticoagulation  . Hypomagnesemia    Past Medical History:  Past Medical History  Diagnosis Date  . Hypertension     with h/o hypertensive urgency  . Glaucoma   . Tremor, essential   . Diabetes mellitus with neuropathy   . B12 deficiency   . Hyperlipidemia   . Atrial fibrillation     a. on chronic coumadin  . Chronic diastolic heart failure     echo 05/2009: mild LVH, EF 55-60%, grade 2 diast dysfxn, mild LAE, PASP  . CAD (coronary artery disease)     a.  LHC 06/2009: pLAD 40%, prox-mid D1 80% (small);  CFX 40-50%, mRCA 20%, EF 65%  -  med Rx;  b.  05/23/11 Myoview - EF 53%, no isch/inf;  c. 07/2011 NSTEMI - PCI/DES LCX - Resolute stent  . Carotid stenosis     dopplers 6/12: 1-39% bilat ICA  . Pulmonary fibrosis     Dr. Vassie Loll;  patient taken off O2 in 8/12; dyspnea felt CHF>>ILD  . Skin cancer   . Shortness of breath   . Atrial fibrillation   . A-fib 08/31/2011  . Warfarin anticoagulation    Past Surgical History:  Past Surgical History  Procedure Date  . Appendectomy   . Nose surgery   . Cardiac catheterization     OT Assessment/Plan/Recommendation OT Assessment Clinical Impression Statement: Pt admitted for chest pain with EKG showing A. fib with a RVR with some  T-wave changes. Pt simultaneously converted back to sinus rhythm on morning of 3/6. Reportedly pt with orthostatic BP on 3/5, but this not seen during eval on 3/6. However, pt did presents with dizziness with standing and ambulation impacting safety with ADLs and ADL mobility. Will benefit from skilled OT in the acute setting to maximize I with ADL and ADL mobility to Mod I- S level upon d/c from acute OT. OT Recommendation/Assessment: Patient will need skilled OT in the acute care venue OT Problem List: Decreased strength;Decreased activity tolerance;Impaired balance (sitting and/or standing);Decreased knowledge of use of DME or AE;Decreased knowledge of precautions;Cardiopulmonary status limiting activity OT Therapy Diagnosis : Generalized weakness OT Plan OT Frequency: Min 2X/week OT Treatment/Interventions: Self-care/ADL training;Therapeutic exercise;DME and/or AE instruction;Therapeutic activities;Patient/family education OT Recommendation Follow Up Recommendations: Home health OT;Supervision/ Min Assistance - 24 hour. If this is not available, will need SNF Equipment Recommended: None recommended by OT Individuals Consulted Consulted and Agree with Results and Recommendations: Patient OT Goals Acute Rehab OT Goals OT Goal Formulation: With patient Time For Goal Achievement: 7 days ADL Goals Pt Will Perform Grooming: with modified independence;Standing at sink ADL Goal: Grooming - Progress: Goal set today Pt Will Perform Upper Body Bathing: with modified independence;Sitting at sink;Sit to stand in shower ADL Goal: Upper Body Bathing - Progress:  Goal set today Pt Will Perform Lower Body Bathing: with modified independence;Sit to stand in shower;Standing at sink ADL Goal: Lower Body Bathing - Progress: Goal set today Pt Will Perform Upper Body Dressing: Independently;Sitting, bed ADL Goal: Upper Body Dressing - Progress: Goal set today Pt Will Perform Lower Body Dressing: with modified  independence;Sit to stand from bed ADL Goal: Lower Body Dressing - Progress: Goal set today Pt Will Transfer to Toilet: with modified independence;Ambulation;3-in-1;with DME ADL Goal: Toilet Transfer - Progress: Goal set today Pt Will Perform Toileting - Clothing Manipulation: Independently;with modified independence;Standing ADL Goal: Toileting - Clothing Manipulation - Progress: Goal set today Pt Will Perform Toileting - Hygiene: Independently;Sitting on 3-in-1 or toilet ADL Goal: Toileting - Hygiene - Progress: Goal set today Pt Will Perform Tub/Shower Transfer: Shower transfer;Ambulation;Shower seat without back;with DME ADL Goal: Web designer - Progress: Goal set today  OT Evaluation Precautions/Restrictions  Precautions Precautions: Fall Precaution Comments: dizziness upon standing and with ambulation. neg for orthostatic BP Restrictions Weight Bearing Restrictions: No Prior Functioning Home Living Lives With: Alone Type of Home: House Home Layout: One level Home Access: Stairs to enter Entrance Stairs-Rails: Right Entrance Stairs-Number of Steps: 1 Bathroom Shower/Tub: Walk-in shower;Tub/shower unit Bathroom Toilet: Standard Home Adaptive Equipment: Walker - rolling;Straight cane;Bedside commode/3-in-1 Additional Comments: Pt states she can get "people" to come check on her, but is resistant to 24 care/supervision   ADL ADL Eating/Feeding: Not assessed Grooming: Not assessed (pt unable to tolerate standing at sink after ambulation) Upper Body Bathing: Simulated;Set up Where Assessed - Upper Body Bathing: Sitting, bed Lower Body Bathing: Performed;Minimal assistance Where Assessed - Lower Body Bathing: Sit to stand from bed Upper Body Dressing: Simulated;Minimal assistance Upper Body Dressing Details (indicate cue type and reason): secondary to lines Where Assessed - Upper Body Dressing: Sitting, bed Lower Body Dressing: Simulated;Moderate assistance Where  Assessed - Lower Body Dressing: Sit to stand from bed Toilet Transfer: Simulated;Minimal assistance Toilet Transfer Details (indicate cue type and reason): Min A with RW ambulation. Pt c/o dizziness with standing and ambulation. BP taken and stable (acutally rose vs fell) Toilet Transfer Method: Ambulating Toileting - Clothing Manipulation: Performed;Minimal assistance Where Assessed - Toileting Clothing Manipulation: Standing Toileting - Hygiene: Performed;Minimal assistance Where Assessed - Toileting Hygiene: Standing Tub/Shower Transfer: Not assessed Equipment Used: Rolling walker Ambulation Related to ADLs: Min A. Pt c/o dizziness and needed to sit after ~ 66ft. BP elevated to 166/68 (92) from 146/48 (72). RN notified.  ADL Comments: Pt. limited by dizziness with ambulation. Adamantly states she does not want to d/c to a facility, but is also opposed to having someone stay with her 24hr/day. Reports she does have "people (family and friends and some nice women)" who can take care of her.  Vision/Perception  Vision - History Patient Visual Report: No change from baseline Sensation/Coordination Sensation Light Touch: Appears Intact Coordination Gross Motor Movements are Fluid and Coordinated: Yes Fine Motor Movements are Fluid and Coordinated: Yes Extremity Assessment RUE Assessment RUE Assessment: Within Functional Limits (easily fatigued) LUE Assessment LUE Assessment: Within Functional Limits (easily fatigued) Mobility  Bed Mobility Bed Mobility:  (did not observe. performed by PT) End of Session OT - End of Session Equipment Utilized During Treatment: Gait belt Activity Tolerance:  (Limited by dizziness and fatigue) Patient left: in chair;with call bell in reach Nurse Communication: Mobility status for transfers;Mobility status for ambulation General Behavior During Session: [facility] for tasks performed Cognition: [facility] for tasks performed   Pradeep Beaubrun 09/01/2011, 3:30 PM

## 2011-09-02 ENCOUNTER — Other Ambulatory Visit: Payer: Self-pay

## 2011-09-02 LAB — URINE CULTURE

## 2011-09-02 LAB — BASIC METABOLIC PANEL
CO2: 28 mEq/L (ref 19–32)
Calcium: 10 mg/dL (ref 8.4–10.5)
Creatinine, Ser: 0.58 mg/dL (ref 0.50–1.10)
GFR calc Af Amer: >90 mL/min (ref >90)

## 2011-09-02 LAB — PROTIME-INR: INR: 3.41 — ABNORMAL HIGH (ref 0.00–1.49)

## 2011-09-02 LAB — VITAMIN B12: Vitamin B-12: 513 pg/mL (ref 211–911)

## 2011-09-02 LAB — GLUCOSE, CAPILLARY
Glucose-Capillary: 310 mg/dL — ABNORMAL HIGH (ref 70–99)
Glucose-Capillary: 137 mg/dL — ABNORMAL HIGH (ref 70–99)

## 2011-09-02 LAB — MAGNESIUM
Magnesium: 1.4 mg/dL — ABNORMAL LOW (ref 1.5–2.5)
Magnesium: 1.6 mg/dL (ref 1.5–2.5)

## 2011-09-02 MED ORDER — MAGNESIUM SULFATE 40 MG/ML IJ SOLN
2.0000 g | Freq: Once | INTRAMUSCULAR | Status: AC
  Administered 2011-09-02: 2 g via INTRAVENOUS
  Filled 2011-09-02: qty 50

## 2011-09-02 MED ORDER — INSULIN ASPART 100 UNIT/ML ~~LOC~~ SOLN
8.0000 [IU] | Freq: Three times a day (TID) | SUBCUTANEOUS | Status: DC
  Administered 2011-09-02 – 2011-09-06 (×12): 8 [IU] via SUBCUTANEOUS

## 2011-09-02 NOTE — Progress Notes (Signed)
At 1047, patient's QTc is 0.61,  I paged Dr. Antoine Poche and the nurse returned my call and suggested to call Trish, the cardmaster.  Shanda Bumps, PA-C responded to my paged and was informed of the informed reading.  She came to the floor and made some changes to the orders and ordered a stat EKG.  Patient received her 3rd dose of Tikosyn and is due for EKG at 2110.  Patient's heart rate has been SR with occasional PAC's.  Denies of any discomfort.  Slight light headedness voiced during ambulation.  Encouraged to call for assistance when she needs to use the bathroom. Patient is alert and oriented.  Gait is not steady.

## 2011-09-02 NOTE — Progress Notes (Signed)
Occupational Therapy Treatment Patient Details Name: Alexandria [name] MRN: [id number] DOB: Jan 31, 1930 Today's Date: 09/02/2011  OT Assessment/Plan OT Assessment/Plan OT Plan: Discharge plan remains appropriate Follow Up Recommendations: Home health OT;Supervision/Assistance - 24 hour Equipment Recommended: None recommended by OT OT Goals ADL Goals ADL Goal: Grooming - Progress: Progressing toward goals ADL Goal: Lower Body Dressing - Progress: Progressing toward goals ADL Goal: Toilet Transfer - Progress: Progressing toward goals ADL Goal: Toileting - Clothing Manipulation - Progress: Progressing toward goals ADL Goal: Toileting - Hygiene - Progress: Progressing toward goals  OT Treatment Precautions/Restrictions  Precautions Precautions: Fall Precaution Comments: dizziness upon standing and with ambulation. neg for orthostatic BP Restrictions Weight Bearing Restrictions: No   ADL ADL Grooming: Performed;Wash/dry hands;Supervision/safety Where Assessed - Grooming: Standing at sink Toilet Transfer: Performed;Minimal Dentist Details (indicate cue type and reason): Min A with RW ambulation. Assist needed to help maneuver RW in and out of bathroom Toilet Transfer Method: Ambulating Toilet Transfer Equipment: Regular height toilet;Grab bars Toileting - Clothing Manipulation: Performed;Supervision/safety Toileting - Clothing Manipulation Details (indicate cue type and reason): pt holding to grab bar on left Where Assessed - Glass blower/designer Manipulation: Standing Toileting - Hygiene: Performed;Supervision/safety Where Assessed - Toileting Hygiene: Sit to stand from 3-in-1 or toilet Tub/Shower Transfer: Not assessed Equipment Used: Rolling walker Ambulation Related to ADLs: Pt c/o dizziness with sit to stand. BP did drop upon standing, but returned to normal after being up for ~62min.  ADL Comments: Pt much more participatory this session after PT had long discussion  with pt re: need for therapy. Believe pt is much steadier with RW, but patient is "scared" of using one. Encouraged pt to continue to use RW with ambulation with RN.  Mobility  Bed Mobility Supine to Sit: 6: Modified independent (Device/Increase time);With rails  End of Session OT - End of Session Equipment Utilized During Treatment: Gait belt Activity Tolerance: Patient tolerated treatment well Patient left: in bed;with call bell in reach General Behavior During Session: [name] for tasks performed Cognition: [facility] for tasks performed  Alexandria [name]  09/02/2011, 1:17 PM

## 2011-09-02 NOTE — Progress Notes (Signed)
SUBJECTIVE:  Weak but better than on admission.  No pain.  Breathing at baseline   PHYSICAL EXAM Filed Vitals:   09/02/11 0026 09/02/11 0100 09/02/11 0300 09/02/11 0500  BP: 155/54 152/51 163/66 177/52  Pulse: 68 68 75 85  Temp: 98.2 F (36.8 C)   100.4 F (38 C)  TempSrc: Oral   Oral  Resp: 22 30 31 25   Height:      Weight:    72 kg (158 lb 11.7 oz)  SpO2: 97% 97% 97% 98%   General:  No acute distress Lungs: Right greater than left basilar crackles. Heart:  RRR Abdomen:  Positive bowel sounds, no rebound no guarding Extremities:  No edema.  LABS: Lab Results  Component Value Date   CKTOTAL 24 09/01/2011   CKMB 2.3 09/01/2011   TROPONINI <0.30 09/01/2011   Results for orders placed during the hospital encounter of 08/31/11 (from the past 24 hour(s))  GLUCOSE, CAPILLARY     Status: Abnormal   Collection Time   09/01/11  8:11 AM      Component Value Range   Glucose-Capillary 281 (*) 70 - 99 (mg/dL)   Comment 1 Notify RN    CARDIAC PANEL(CRET KIN+CKTOT+MB+TROPI)     Status: Normal   Collection Time   09/01/11  9:20 AM      Component Value Range   Total CK 24  7 - 177 (U/L)   CK, MB 2.3  0.3 - 4.0 (ng/mL)   Troponin I <0.30  <0.30 (ng/mL)   Relative Index RELATIVE INDEX IS INVALID  0.0 - 2.5   BASIC METABOLIC PANEL     Status: Abnormal   Collection Time   09/01/11  9:20 AM      Component Value Range   Sodium 133 (*) 135 - 145 (mEq/L)   Potassium 3.8  3.5 - 5.1 (mEq/L)   Chloride 97  96 - 112 (mEq/L)   CO2 28  19 - 32 (mEq/L)   Glucose, Bld 328 (*) 70 - 99 (mg/dL)   BUN 11  6 - 23 (mg/dL)   Creatinine, Ser 1.19  0.50 - 1.10 (mg/dL)   Calcium 14.7  8.4 - 10.5 (mg/dL)   GFR calc non Af Amer 86 (*) >90 (mL/min)   GFR calc Af Amer >90  >90 (mL/min)  MAGNESIUM     Status: Normal   Collection Time   09/01/11  9:20 AM      Component Value Range   Magnesium 1.7  1.5 - 2.5 (mg/dL)  DIGOXIN LEVEL     Status: Abnormal   Collection Time   09/01/11  9:20 AM      Component Value  Range   Digoxin Level 0.7 (*) 0.8 - 2.0 (ng/mL)  GLUCOSE, CAPILLARY     Status: Abnormal   Collection Time   09/01/11 10:05 AM      Component Value Range   Glucose-Capillary 363 (*) 70 - 99 (mg/dL)  GLUCOSE, CAPILLARY     Status: Abnormal   Collection Time   09/01/11 12:26 PM      Component Value Range   Glucose-Capillary 221 (*) 70 - 99 (mg/dL)   Comment 1 Notify RN    BASIC METABOLIC PANEL     Status: Abnormal   Collection Time   09/01/11  4:00 PM      Component Value Range   Sodium 133 (*) 135 - 145 (mEq/L)   Potassium 4.0  3.5 - 5.1 (mEq/L)   Chloride 95 (*) 96 -  112 (mEq/L)   CO2 28  19 - 32 (mEq/L)   Glucose, Bld 245 (*) 70 - 99 (mg/dL)   BUN 11  6 - 23 (mg/dL)   Creatinine, Ser 8.65  0.50 - 1.10 (mg/dL)   Calcium 78.4  8.4 - 10.5 (mg/dL)   GFR calc non Af Amer 84 (*) >90 (mL/min)   GFR calc Af Amer >90  >90 (mL/min)  MAGNESIUM     Status: Normal   Collection Time   09/01/11  4:00 PM      Component Value Range   Magnesium 1.8  1.5 - 2.5 (mg/dL)  GLUCOSE, CAPILLARY     Status: Abnormal   Collection Time   09/01/11  4:55 PM      Component Value Range   Glucose-Capillary 277 (*) 70 - 99 (mg/dL)   Comment 1 Notify RN    GLUCOSE, CAPILLARY     Status: Abnormal   Collection Time   09/01/11  8:35 PM      Component Value Range   Glucose-Capillary 159 (*) 70 - 99 (mg/dL)  URINALYSIS, ROUTINE W REFLEX MICROSCOPIC     Status: Abnormal   Collection Time   09/01/11 10:48 PM      Component Value Range   Color, Urine YELLOW  YELLOW    APPearance CLEAR  CLEAR    Specific Gravity, Urine 1.015  1.005 - 1.030    pH 7.0  5.0 - 8.0    Glucose, UA 100 (*) NEGATIVE (mg/dL)   Hgb urine dipstick NEGATIVE  NEGATIVE    Bilirubin Urine NEGATIVE  NEGATIVE    Ketones, ur NEGATIVE  NEGATIVE (mg/dL)   Protein, ur 30 (*) NEGATIVE (mg/dL)   Urobilinogen, UA 1.0  0.0 - 1.0 (mg/dL)   Nitrite NEGATIVE  NEGATIVE    Leukocytes, UA NEGATIVE  NEGATIVE   URINE MICROSCOPIC-ADD ON     Status: Abnormal    Collection Time   09/01/11 10:48 PM      Component Value Range   Squamous Epithelial / LPF FEW (*) RARE    WBC, UA 0-2  <3 (WBC/hpf)   RBC / HPF 0-2  <3 (RBC/hpf)   Bacteria, UA FEW (*) RARE   PROTIME-INR     Status: Abnormal   Collection Time   09/02/11  4:00 AM      Component Value Range   Prothrombin Time 34.9 (*) 11.6 - 15.2 (seconds)   INR 3.41 (*) 0.00 - 1.49   MAGNESIUM     Status: Abnormal   Collection Time   09/02/11  4:00 AM      Component Value Range   Magnesium 1.4 (*) 1.5 - 2.5 (mg/dL)  BASIC METABOLIC PANEL     Status: Abnormal   Collection Time   09/02/11  4:00 AM      Component Value Range   Sodium 135  135 - 145 (mEq/L)   Potassium 4.5  3.5 - 5.1 (mEq/L)   Chloride 97  96 - 112 (mEq/L)   CO2 28  19 - 32 (mEq/L)   Glucose, Bld 239 (*) 70 - 99 (mg/dL)   BUN 8  6 - 23 (mg/dL)   Creatinine, Ser 6.96  0.50 - 1.10 (mg/dL)   Calcium 29.5  8.4 - 10.5 (mg/dL)   GFR calc non Af Amer 85 (*) >90 (mL/min)   GFR calc Af Amer >90  >90 (mL/min)    Intake/Output Summary (Last 24 hours) at 09/02/11 0738 Last data filed at 09/02/11 0500  Gross  per 24 hour  Intake    370 ml  Output   1400 ml  Net  -1030 ml    ASSESSMENT AND PLAN:   1)  A-fib:  Started Tikosyn.  One dose given last night.  Magnesium remains low today and I will again supplement that and repeat in the am.  QTc .41 this am.     2)  HYPERTENSION: BP is slightly elevated.  Continue current medications    3) CAD:  No evidence of ischemia.  No change in therapy.   4)  Chills:  Low grade temp today with diaphoresis and chills.  Etiology unclear.  The patient has also had a dramatic unexplained weight loss with decreased appetite over several months.     [name] 09/02/2011 7:38 AM

## 2011-09-02 NOTE — Progress Notes (Signed)
ANTICOAGULATION CONSULT NOTE - Initial Consult  Pharmacy Consult for coumadin Indication: afib  Allergies  Allergen Reactions  . Ace Inhibitors Cough    Enalapril  benicor    . Crestor (Rosuvastatin Calcium)     Fatigue and leg pain   . Lipitor (Atorvastatin Calcium)     Myalgias   . Niaspan (Niacin)     Intol   . Olmesartan   . Prednisone     REACTION: "retained fluid"    Patient Measurements: Wt=70.8kg Ht= 64 inches  Vital Signs: Temp: 98.9 F (37.2 C) (03/07 0749) Temp src: Oral (03/07 0749) BP: 113/73 mmHg (03/07 0749) Pulse Rate: 79  (03/07 0749)  Labs:  Basename 09/02/11 0400 09/01/11 1600 09/01/11 0920 09/01/11 0400 08/31/11 2001 08/31/11 1319  HGB -- -- -- 11.3* -- 13.3  HCT -- -- -- 32.9* -- 37.2  PLT -- -- -- 244 -- 257  APTT -- -- -- 51* -- --  LABPROT 34.9* -- -- 26.6* -- 25.4*  INR 3.41* -- -- 2.41* -- 2.27*  HEPARINUNFRC -- -- -- -- -- --  CREATININE 0.58 0.60 0.56 -- -- --  CKTOTAL -- -- 24 25 24  --  CKMB -- -- 2.3 2.6 2.3 --  TROPONINI -- -- <0.30 <0.30 <0.30 --   Estimated Creatinine Clearance: 55.5 ml/min (by C-G formula based on Cr of 0.58).  Medical History: Past Medical History  Diagnosis Date  . Hypertension     with h/o hypertensive urgency  . Glaucoma   . Tremor, essential   . Diabetes mellitus with neuropathy   . B12 deficiency   . Hyperlipidemia   . Atrial fibrillation     a. on chronic coumadin  . Chronic diastolic heart failure     echo 05/2009: mild LVH, EF 55-60%, grade 2 diast dysfxn, mild LAE, PASP  . CAD (coronary artery disease)     a.  LHC 06/2009: pLAD 40%, prox-mid D1 80% (small);  CFX 40-50%, mRCA 20%, EF 65%  -  med Rx;  b.  05/23/11 Myoview - EF 53%, no isch/inf;  c. 07/2011 NSTEMI - PCI/DES LCX - Resolute stent  . Carotid stenosis     dopplers 6/12: 1-39% bilat ICA  . Pulmonary fibrosis     Dr. Vassie Loll;  patient taken off O2 in 8/12; dyspnea felt CHF>>ILD  . Skin cancer   . Shortness of breath   . Atrial  fibrillation   . A-fib 08/31/2011  . Warfarin anticoagulation    Coumadin home regmien 10 mg on Tuesdays, Thursdays & Saturday. & 5 mg other days of the week (last dose 08/30/11)  Assessment: 78 yo female here with weakness. She has a history of afib and is on coumadin at home. INR increased from 2.4 to 3.4 overnight. Afib converted to NSR.  Tikosyn started last evening.  Mg = 1.4 this morning.  Goal of Therapy:  INR 2-3   Plan:  -No Coumadin today -INR daily -Will need magnesium supplement today  Mickeal Skinner 09/02/2011,7:58 AM

## 2011-09-02 NOTE — Progress Notes (Signed)
Physical Therapy Treatment Patient Details Name: Alexandria [name] MRN: [id number] DOB: May 02, 1930 Today's Date: 09/02/2011  PT Assessment/Plan  PT - Assessment/Plan Comments on Treatment Session: Initally today Mrs. Kopecky very upset about how therapy was trying to tell her that she needed help and that she was independent and didn't need any help at home. Spent a significant amount of time with Mrs. Purpura discussing the purpose of therapy and why we had concerns. Pt more receptive to therapy with explanation. Pt still unstable today during gait and needed to sit relatively quickly because of fatigue. Will need training with RW prior to d/c and I still recommend an initial 24 hour assist for d/c and the pt is aware of this. Following the session today the pt understood my concerns about her safety and the purpose for therapy being to keep her independent. Pt still a bit defensive but willing to listen.  PT Plan: Discharge plan remains appropriate;Frequency remains appropriate Follow Up Recommendations: Home health PT;Supervision/Assistance - 24 hour (initial 24 hours assist) Equipment Recommended:  (pt needs to clarify if she has a RW at home) PT Goals  Acute Rehab PT Goals PT Goal: Sit to Stand - Progress: Progressing toward goal PT Goal: Stand to Sit - Progress: Progressing toward goal PT Goal: Stand - Progress: Progressing toward goal PT Goal: Ambulate - Progress: Progressing toward goal  PT Treatment Precautions/Restrictions  Precautions Precautions: Fall Restrictions Weight Bearing Restrictions: No Mobility (including Balance) Bed Mobility Bed Mobility: No (pt upright in her chair) Supine to Sit: 6: Modified independent (Device/Increase time);With rails Transfers Sit to Stand: With armrests;From chair/3-in-1;With upper extremity assist Sit to Stand Details (indicate cue type and reason): stood with mingaurdA cues for safe technique Stand to Sit: With armrests;To chair/3-in-1;With  upper extremity assist Stand to Sit Details: pt sat in recliner in hallway 2/2 fatigue then stood and ambulated another 40 ft Ambulation/Gait Ambulation/Gait Assistance: 4: Min assist;3: Mod assist Ambulation/Gait Assistance Details (indicate cue type and reason): amb. approx 70 ft with HHA and min-modA for stability; pt became fatigued and did not feel well needing to sit in recliner to rest, BP checked and WNL other VSS; pt then stood and ambulated another 40 ft again with min-modA and HHA on right; pt amb with staggering gait pattern occasionally scissoring 2/2 imbalance, slight lean to right; cued pt to push into therapists hand like a cane and pt able to ambulate a bit more steadily however would benefit from a RW for improved independence; after second ambulation pt's sats dropped to high 80s on RA but pt recovered very quickly upon sitting and resting Ambulation Distance (Feet): 110 Feet Assistive device: 1 person hand held assist  Static Standing Balance Static Standing - Balance Support: Right upper extremity supported Static Standing - Level of Assistance: 4: Min assist Exercise    End of Session PT - End of Session Equipment Utilized During Treatment: Gait belt Activity Tolerance: Patient tolerated treatment well (initally resistant to therapy but agreeable with discussion) Patient left: in chair;with call bell in reach Nurse Communication: Mobility status for transfers;Mobility status for ambulation General Behavior During Session: [facility] for tasks performed Cognition: Impaired Cognitive Impairment: safety awareness still an issue; pt very insistent on being and independent lady and doesn't need help; after about a 10 minute discussion prior to therapy pt more agreeable to treatment and another discussion following session about safety hazards and purpose of therapy pt more receptive  [facility] HELEN 09/02/2011, 4:03 PM

## 2011-09-02 NOTE — Progress Notes (Signed)
[facility] [place] TEAM 1  Subjective: 78 year old female with a history of atrial fibrillation on chronic Coumadin therapy, history of chronic diastolic heart failure, history of coronary artery disease status post non-ST elevated MI 08/12/2001 status post cardiac catheterization with 99% proximal circumflex stenosis status post resolute integrity drug eluding stent with a EF of 65%, history of diabetes mellitus, history of hyperlipidemia who presents to the ED with generalized weakness and near syncope.  Alert and sitting upright in chair. Very adamant that she does not need any help or any assistive devices at home. PT noted transient drop on O2 sats with mobilizing today.  Objective: Weight change: 0.5 kg (1 lb 1.6 oz)  Intake/Output Summary (Last 24 hours) at 09/02/11 1432 Last data filed at 09/02/11 1300  Gross per 24 hour  Intake      0 ml  Output   1900 ml  Net  -1900 ml   Blood pressure 136/58, pulse 63, temperature 98.2 F (36.8 C), temperature source Oral, resp. rate 24, height 5\' 4"  (1.626 m), weight 72 kg (158 lb 11.7 oz), SpO2 92.00%.  Physical Exam: General: No acute respiratory distress Lungs: Clear to auscultation bilaterally without wheezes or crackles Cardiovascular: Regular rate and rhythm without murmur gallop or rub  Abdomen: Nontender, nondistended, soft, bowel sounds positive, no rebound, no ascites, no appreciable mass Extremities: No significant cyanosis, clubbing, or edema bilateral lower extremities  Lab Results:  Basename 09/02/11 0400 09/01/11 1600 09/01/11 0920  NA 135 133* 133*  K 4.5 4.0 3.8  CL 97 95* 97  CO2 28 28 28   GLUCOSE 239* 245* 328*  BUN 8 11 11   CREATININE 0.58 0.60 0.56  CALCIUM 10.0 10.5 10.1  MG 1.4* 1.8 1.7  PHOS -- -- --    Basename 09/01/11 0400  AST 11  ALT 9  ALKPHOS 72  BILITOT 0.4  PROT 6.5  ALBUMIN 2.6*    Basename 09/01/11 0400 08/31/11 1319  WBC 9.9 11.3*  NEUTROABS -- 8.9*  HGB 11.3* 13.3  HCT  32.9* 37.2  MCV 82.9 81.9  PLT 244 257    Basename 09/01/11 0920 09/01/11 0400 08/31/11 2001  CKTOTAL 24 25 24   CKMB 2.3 2.6 2.3  CKMBINDEX -- -- --  TROPONINI <0.30 <0.30 <0.30    Micro Results: Recent Results (from the past 240 hour(s))  URINE CULTURE     Status: Normal   Collection Time   08/31/11  1:06 PM      Component Value Range Status Comment   Specimen Description URINE, CATHETERIZED   Final    Special Requests NONE   Final    Culture  Setup Time [id number]   Final    Colony Count NO GROWTH   Final    Culture NO GROWTH   Final    Report Status 09/01/2011 FINAL   Final   MRSA PCR SCREENING     Status: Normal   Collection Time   08/31/11  7:33 PM      Component Value Range Status Comment   MRSA by PCR NEGATIVE  NEGATIVE  Final   URINE CULTURE     Status: Normal   Collection Time   08/31/11 11:32 PM      Component Value Range Status Comment   Specimen Description URINE, CLEAN CATCH   Final    Special Requests NONE   Final    Culture  Setup Time [id number]   Final    Colony Count >=100,000 COLONIES/ML   Final  Culture     Final    Value: Multiple bacterial morphotypes present, none predominant. Suggest appropriate recollection if clinically indicated.   Report Status 09/02/2011 FINAL   Final     Studies/Results: All recent x-ray/radiology reports have been reviewed in detail.   Medications: I have reviewed the patient's complete medication list.  Assessment/Plan:  afib on chronic A. fib with acute symptomatic RVR *[facility] is following with [place] - they have started [place] - she is in the process of having her hypomagnesemia corrected *IV cardizem was been stopped on 3/6 in favor of oral dosing *digoxin response and levels will need to be followed closely  presyncope *Felt to be due to afib w/ RVR  HTN Controlled better today  DM *Uncontrolled - and patient endorses this is a chronic issue *She apparently can't afford Lantus/Levemir *Will ask  diabetes coordinator to follow *will increase meal coverage  Essential tremor *Stable  B12 deficiency *Level up to 512 this admission  CAD s/p NTEMI w/ PTCA/DES 08/13/2011 *No chest pain   Pulmonary fibrosis *Experiencing transient hypoxia with mobilization and patient related has limited functional capacity so may benefit from prn home O2 *NOT a candidate for amiodarone  Diastolic CHF *Well compensated at present - control BP  Junious Silk, ANP [facility] Office  [phone] Pager [phone]  On-Call/Text Page:      [email or web address]      password [email or web address]   I have personally examined the patient and reviewed the entire database. Agree with the above note and plan as outlined by Ms. Rennis Harding, NPThad Ranger M.D. [facility] 09/02/2011, 3:42 PM

## 2011-09-02 NOTE — Progress Notes (Signed)
SPOKE WITH PT IN REF TO HH PT/OT BEING RECOMMENDED WHEN READY FOR DC.  PT IS THINKING ABOUT THIS AND WOULD LIKE TO GIVE HER ANSWER TOMORROW.  WILL F/U.   Willa Rough [phone] OR [phone] 09/02/2011

## 2011-09-03 ENCOUNTER — Other Ambulatory Visit: Payer: Self-pay

## 2011-09-03 LAB — GLUCOSE, CAPILLARY: Glucose-Capillary: 134 mg/dL — ABNORMAL HIGH (ref 70–99)

## 2011-09-03 LAB — BASIC METABOLIC PANEL
CO2: 24 mEq/L (ref 19–32)
Calcium: 10.6 mg/dL — ABNORMAL HIGH (ref 8.4–10.5)
Chloride: 98 mEq/L (ref 96–112)
Creatinine, Ser: 0.63 mg/dL (ref 0.50–1.10)
Glucose, Bld: 213 mg/dL — ABNORMAL HIGH (ref 70–99)
Sodium: 136 mEq/L (ref 135–145)

## 2011-09-03 LAB — MAGNESIUM: Magnesium: 1.4 mg/dL — ABNORMAL LOW (ref 1.5–2.5)

## 2011-09-03 LAB — PROTIME-INR: INR: 3.1 — ABNORMAL HIGH (ref 0.00–1.49)

## 2011-09-03 MED ORDER — MAGNESIUM SULFATE 40 MG/ML IJ SOLN
4.0000 g | Freq: Once | INTRAMUSCULAR | Status: AC
  Administered 2011-09-03: 4 g via INTRAVENOUS
  Filled 2011-09-03: qty 100

## 2011-09-03 MED ORDER — MAGNESIUM GLUCONATE 500 MG PO TABS
500.0000 mg | ORAL_TABLET | Freq: Two times a day (BID) | ORAL | Status: DC
  Administered 2011-09-03 – 2011-09-06 (×7): 500 mg via ORAL
  Filled 2011-09-03 (×10): qty 1

## 2011-09-03 MED ORDER — WARFARIN SODIUM 5 MG PO TABS
5.0000 mg | ORAL_TABLET | Freq: Once | ORAL | Status: AC
  Administered 2011-09-03: 5 mg via ORAL
  Filled 2011-09-03: qty 1

## 2011-09-03 NOTE — Progress Notes (Signed)
Clinical social worker discussed pt with pt MD and medical team. Pt is very adamant regarding pt discharging home with home health services. CSW informed RN Sports coach of home health needs. PT and pt MD discussed risks and safety concerns with pt and pt still requested to go home with home health services.   .No further Clinical Social Work needs, signing off.   Catha Gosselin, Theresia Majors  [phone] .09/03/2011 14:11pm

## 2011-09-03 NOTE — Progress Notes (Signed)
SUBJECTIVE:  Weak but better than on admission.  No pain.  Breathing at baseline   PHYSICAL EXAM Filed Vitals:   09/03/11 0000 09/03/11 0013 09/03/11 0400 09/03/11 0448  BP:  151/56  154/76  Pulse: 65 62 61 76  Temp:  98.2 F (36.8 C)  97.9 F (36.6 C)  TempSrc:  Oral  Oral  Resp:  18  18  Height:      Weight:  70.3 kg (154 lb 15.7 oz)    SpO2: 96% 95% 99% 97%   General:  No acute distress Lungs: Few basilar crackles Heart:  RRR Abdomen:  Positive bowel sounds, no rebound no guarding Extremities:  No edema.  LABS: Lab Results  Component Value Date   CKTOTAL 24 09/01/2011   CKMB 2.3 09/01/2011   TROPONINI <0.30 09/01/2011   Results for orders placed during the hospital encounter of 08/31/11 (from the past 24 hour(s))  GLUCOSE, CAPILLARY     Status: Abnormal   Collection Time   09/02/11 12:37 PM      Component Value Range   Glucose-Capillary 310 (*) 70 - 99 (mg/dL)   Comment 1 Notify RN    MAGNESIUM     Status: Normal   Collection Time   09/02/11  3:09 PM      Component Value Range   Magnesium 1.6  1.5 - 2.5 (mg/dL)  GLUCOSE, CAPILLARY     Status: Abnormal   Collection Time   09/02/11  4:57 PM      Component Value Range   Glucose-Capillary 137 (*) 70 - 99 (mg/dL)   Comment 1 Notify RN    GLUCOSE, CAPILLARY     Status: Abnormal   Collection Time   09/03/11 12:15 AM      Component Value Range   Glucose-Capillary 134 (*) 70 - 99 (mg/dL)   Comment 1 Documented in Chart     Comment 2 Notify RN    PROTIME-INR     Status: Abnormal   Collection Time   09/03/11  5:23 AM      Component Value Range   Prothrombin Time 32.4 (*) 11.6 - 15.2 (seconds)   INR 3.10 (*) 0.00 - 1.49   BASIC METABOLIC PANEL     Status: Abnormal   Collection Time   09/03/11  5:23 AM      Component Value Range   Sodium 136  135 - 145 (mEq/L)   Potassium 4.1  3.5 - 5.1 (mEq/L)   Chloride 98  96 - 112 (mEq/L)   CO2 24  19 - 32 (mEq/L)   Glucose, Bld 213 (*) 70 - 99 (mg/dL)   BUN 10  6 - 23 (mg/dL)   Creatinine, Ser 4.09  0.50 - 1.10 (mg/dL)   Calcium 81.1 (*) 8.4 - 10.5 (mg/dL)   GFR calc non Af Amer 83 (*) >90 (mL/min)   GFR calc Af Amer >90  >90 (mL/min)  MAGNESIUM     Status: Abnormal   Collection Time   09/03/11  5:23 AM      Component Value Range   Magnesium 1.4 (*) 1.5 - 2.5 (mg/dL)    Intake/Output Summary (Last 24 hours) at 09/03/11 0834 Last data filed at 09/03/11 0400  Gross per 24 hour  Intake      0 ml  Output   2570 ml  Net  -2570 ml   EKG:  NSR rate QTC is about 430.  09/03/2011  ASSESSMENT AND PLAN:   1)  A-fib:  Started  Tikosyn.  Magnesium remains low today.  I discussed with primary team.  She will get IV and PO magnesium today.  It looks like she has had 3 doses of Tikosyn thus far including the dose this am.   2)  HYPERTENSION: BP is slightly elevated.  Continue current medications    3) CAD:  No evidence of ischemia.  No change in therapy.   Fayrene Fearing [facility] 09/03/2011 8:34 AM

## 2011-09-03 NOTE — Progress Notes (Signed)
ANTICOAGULATION CONSULT NOTE   Pharmacy Consult for coumadin Indication: afib  Allergies  Allergen Reactions  . Ace Inhibitors Cough    Enalapril  benicor    . Crestor (Rosuvastatin Calcium)     Fatigue and leg pain   . Lipitor (Atorvastatin Calcium)     Myalgias   . Niaspan (Niacin)     Intol   . Olmesartan   . Prednisone     REACTION: "retained fluid"    Patient Measurements: Wt=70.8kg Ht= 64 inches  Vital Signs: Temp: 97.6 F (36.4 C) (03/08 0800) Temp src: Axillary (03/08 0800) BP: 126/59 mmHg (03/08 0926) Pulse Rate: 90  (03/08 0926)  Labs:  Basename 09/03/11 0523 09/02/11 0400 09/01/11 1600 09/01/11 0920 09/01/11 0400 08/31/11 2001 08/31/11 1319  HGB -- -- -- -- 11.3* -- 13.3  HCT -- -- -- -- 32.9* -- 37.2  PLT -- -- -- -- 244 -- 257  APTT -- -- -- -- 51* -- --  LABPROT 32.4* 34.9* -- -- 26.6* -- --  INR 3.10* 3.41* -- -- 2.41* -- --  HEPARINUNFRC -- -- -- -- -- -- --  CREATININE 0.63 0.58 0.60 -- -- -- --  CKTOTAL -- -- -- 24 25 24  --  CKMB -- -- -- 2.3 2.6 2.3 --  TROPONINI -- -- -- <0.30 <0.30 <0.30 --   Estimated Creatinine Clearance: 54.8 ml/min (by C-G formula based on Cr of 0.63).  Medical History: Past Medical History  Diagnosis Date  . Hypertension     with h/o hypertensive urgency  . Glaucoma   . Tremor, essential   . Diabetes mellitus with neuropathy   . B12 deficiency   . Hyperlipidemia   . Atrial fibrillation     a. on chronic coumadin  . Chronic diastolic heart failure     echo 05/2009: mild LVH, EF 55-60%, grade 2 diast dysfxn, mild LAE, PASP  . CAD (coronary artery disease)     a.  LHC 06/2009: pLAD 40%, prox-mid D1 80% (small);  CFX 40-50%, mRCA 20%, EF 65%  -  med Rx;  b.  05/23/11 Myoview - EF 53%, no isch/inf;  c. 07/2011 NSTEMI - PCI/DES LCX - Resolute stent  . Carotid stenosis     dopplers 6/12: 1-39% bilat ICA  . Pulmonary fibrosis     Dr. Vassie Loll;  patient taken off O2 in 8/12; dyspnea felt CHF>>ILD  . Skin cancer   .  Shortness of breath   . Atrial fibrillation   . A-fib 08/31/2011  . Warfarin anticoagulation    Coumadin home regmien 10 mg on Tuesdays, Thursdays & Saturday. & 5 mg other days of the week (last dose 08/30/11)  Assessment: 78 yo female here with weakness. She has a history of afib and is on coumadin at home. INR decreased from 3.4 to 3.1 overnight. Afib converted to NSR.  Tikosyn started last 3/6.  Mg = 1.4 this morning.  Goal of Therapy:  INR 2-3   Plan:  -Coumadin 5mg  x 1 today -INR daily -Magnesium supplement ordered by MD  Mickeal Skinner 09/03/2011,9:46 AM

## 2011-09-03 NOTE — Progress Notes (Signed)
I HAVE FOLLOWED UP WITH THE PT AND SHE FEELS THAT SHE DOESN'T NEED HH AT THIS TIME AND SHE STATED THAT SHE HAS SEVERAL DME'S AT HOME INCLUDING A RW.   Alexandria [name] [phone] OR [phone] 09/03/2011

## 2011-09-03 NOTE — Progress Notes (Signed)
[facility] [place] TEAM 1  Subjective: 78 year old [name] with a history of atrial fibrillation on chronic Coumadin therapy, history of chronic diastolic heart failure, history of coronary artery disease status post non-ST elevated MI 08/12/2001 status post cardiac catheterization with 99% proximal circumflex stenosis status post resolute integrity drug eluding stent with a EF of 65%, history of diabetes mellitus, history of hyperlipidemia who presents to the ED with generalized weakness and near syncope.  Alert and no complaints (except really wanted bacon for breakfast and instead got fruit and yogurt).  Denies f/c, sob, n/v, or abdom pain.    Objective: Weight change: -1.7 kg (-3 lb 12 oz)  Intake/Output Summary (Last 24 hours) at 03/08/Alexandria 1326 Last data filed at 03/08/Alexandria 1153  Gross per 24 hour  Intake    340 ml  Output   1950 ml  Net  -1610 ml   Blood pressure 138/100, pulse 70, temperature 98.2 F (36.8 C), temperature source Oral, resp. rate 18, height 5\' 4"  (1.626 m), weight 70.3 kg (154 lb 15.7 oz), SpO2 94.00%.  Physical Exam: General: No acute respiratory distress Lungs: Clear to auscultation bilaterally without wheezes or crackles Cardiovascular: Regular rate and rhythm without murmur gallop or rub  Abdomen: Nontender, nondistended, soft, bowel sounds positive, no rebound, no ascites, no appreciable mass Extremities: No significant cyanosis, clubbing, or edema bilateral lower extremities  Lab Results:  Basename 03/08/Alexandria 0523 03/07/Alexandria 1509 03/07/Alexandria 0400 03/06/Alexandria 1600  NA 136 -- 135 133*  K 4.1 -- 4.5 4.0  CL 98 -- 97 95*  CO2 24 -- 28 28  GLUCOSE 213* -- 239* 245*  BUN 10 -- 8 11  CREATININE 0.63 -- 0.58 0.60  CALCIUM 10.6* -- 10.0 10.5  MG 1.4* 1.6 1.4* --  PHOS -- -- -- --    Basename 03/06/Alexandria 0400  AST 11  ALT 9  ALKPHOS 72  BILITOT 0.4  PROT 6.5  ALBUMIN 2.6*    Basename 03/06/Alexandria 0400  WBC 9.9  NEUTROABS --  HGB 11.3*  HCT 32.9*  MCV  82.9  PLT 244    Basename 03/06/Alexandria 0920 03/06/Alexandria 0400 03/05/Alexandria 2001  CKTOTAL 24 25 24   CKMB 2.3 2.6 2.3  CKMBINDEX -- -- --  TROPONINI <0.30 <0.30 <0.30    Micro Results: Recent Results (from the past 240 hour(s))  URINE CULTURE     Status: Normal   Collection Time   3/5/Alexandria  1:06 PM      Component Value Range Status Comment   Specimen Description URINE, CATHETERIZED   Final    Special Requests NONE   Final    Culture  Setup Time [id number]   Final    Colony Count NO GROWTH   Final    Culture NO GROWTH   Final    Report Status 09/01/2011 FINAL   Final   MRSA PCR SCREENING     Status: Normal   Collection Time   3/5/Alexandria  7:33 PM      Component Value Range Status Comment   MRSA by PCR NEGATIVE  NEGATIVE  Final   URINE CULTURE     Status: Normal   Collection Time   3/5/Alexandria 11:32 PM      Component Value Range Status Comment   Specimen Description URINE, CLEAN CATCH   Final    Special Requests NONE   Final    Culture  Setup Time [id number]   Final    Colony Count >=100,000 COLONIES/ML   Final    Culture  Final    Value: Multiple bacterial morphotypes present, none predominant. Suggest appropriate recollection if clinically indicated.   Report Status 09/02/2011 FINAL   Final     Studies/Results: All recent x-ray/radiology reports have been reviewed in detail.   Medications: I have reviewed the patient's complete medication list.  Assessment/Plan:  afib on chronic A. fib with acute symptomatic RVR *[facility] is following with [place] - they have started [place] - she is in the process of having her hypomagnesemia corrected *IV cardizem was been stopped on 3/6 in favor of oral dosing *digoxin response and levels will need to be followed closely  Hypomagnesemia *oral and IV replete of magnesium *check level in am  presyncope *Felt to be due to afib w/ RVR  HTN Controlled better today  DM *Uncontrolled - and patient endorses this is a chronic issue *She  apparently can't afford Lantus/Levemir *Will ask diabetes coordinator to follow *CBG better with increase in meal coverage * Did not use meal coverage at home and also endorsed variable eating patterns and sweet tooth which she occasionally endulges. Encouraged to follow CBG closely and utilize meal coverage or SSI if not strictly adherent to carb modified diet  Essential tremor *Stable  B12 deficiency *Level up to 512 this admission  CAD s/p NTEMI w/ PTCA/DES 08/13/2011 *No chest pain   Pulmonary fibrosis *Experiencing transient hypoxia with mobilization and patient related has limited functional capacity so may benefit from prn home O2 *NOT a candidate for amiodarone  Diastolic CHF *Well compensated at present - control BP  Disposition *Transfer to telemetry *Because of Tikosyn load can only move to units 2000 or 3700  Junious Silk, ANP [facility] Office  [phone] Pager [phone]  On-Call/Text Page:      [email or web address]      password [email or web address]  I have personally examined this patient and reviewed the entire database. I have reviewed the above note, made any necessary editorial changes, and agree with its content.  Lonia Blood, MD [facility]

## 2011-09-04 ENCOUNTER — Other Ambulatory Visit: Payer: Self-pay

## 2011-09-04 LAB — BASIC METABOLIC PANEL
CO2: 30 mEq/L (ref 19–32)
Chloride: 95 mEq/L — ABNORMAL LOW (ref 96–112)
Glucose, Bld: 293 mg/dL — ABNORMAL HIGH (ref 70–99)
Sodium: 134 mEq/L — ABNORMAL LOW (ref 135–145)

## 2011-09-04 LAB — MAGNESIUM: Magnesium: 1.8 mg/dL (ref 1.5–2.5)

## 2011-09-04 LAB — PROTIME-INR: INR: 2.77 — ABNORMAL HIGH (ref 0.00–1.49)

## 2011-09-04 LAB — GLUCOSE, CAPILLARY
Glucose-Capillary: 290 mg/dL — ABNORMAL HIGH (ref 70–99)
Glucose-Capillary: 149 mg/dL — ABNORMAL HIGH (ref 70–99)

## 2011-09-04 MED ORDER — FUROSEMIDE 40 MG PO TABS
40.0000 mg | ORAL_TABLET | Freq: Two times a day (BID) | ORAL | Status: DC
  Administered 2011-09-04 – 2011-09-05 (×2): 40 mg via ORAL
  Filled 2011-09-04 (×4): qty 1

## 2011-09-04 MED ORDER — WARFARIN SODIUM 7.5 MG PO TABS
7.5000 mg | ORAL_TABLET | Freq: Once | ORAL | Status: AC
  Administered 2011-09-04: 7.5 mg via ORAL
  Filled 2011-09-04: qty 1

## 2011-09-04 NOTE — Progress Notes (Signed)
PROGRESS NOTE  Alexandria [name] ZOX:[id number] DOB: May 09, 1930 DOA: 08/31/2011 PCP: [name], MD, MD  Pulmonologist-alva Cardiologist-Hochrein  Brief narrative: 78 year old Caucasian female admitted 3/5 with atrial fibrillation and RVR, near syncope  Past medical history: CAD s/p NSTEMI+DES 08/13/11, hypertension, diabetes mellitus, hyperlipidemia, atrial fibrillation, ??interstitial lung disease with PFTs [TLC 61%, DLCO 40%, FVC 52%] discontinued off oxygen by primary pulmonologist, carotid stenosis 6/12 1-39% bilateral ICA stenosis, glaucoma, skin cancer, B12 deficiency,  EF 65% per cardiac cath 07/2011  Consultants:  Cardiology  Procedures:  Chest x-ray 3/5 = stable cardiomegaly with probable mild chronic interstitial change but no active process  CT scan 3/5 = no acute abnormalities. Atrophy and chronic small vessel ischemic changes  UC multiple bacteria +, >100,000  Antibiotics:  none   Subjective  Doing well. No chest pain or shortness of breath. Not sure how she feels. Relates that She has lost 67 pounds since 2011. Going to the bathroom frequently, no dysuria   Objective   Interim History: History of present illness is reviewed, progress notes and cardiology consult notes reviewed  Objective: Filed Vitals:   09/03/11 1403 09/03/11 2200 09/04/11 0500 09/04/11 1010  BP: 132/50 140/50 161/62 169/68  Pulse: 66 75 76 76  Temp: 98.1 F (36.7 C) 99.1 F (37.3 C) 98.6 F (37 C)   TempSrc: Oral  Oral   Resp: 18 18 18    Height:      Weight:   69.5 kg (153 lb 3.5 oz)   SpO2: 91% 91% 92%     Intake/Output Summary (Last 24 hours) at 09/04/11 1358 Last data filed at 09/03/11 1700  Gross per 24 hour  Intake    240 ml  Output      0 ml  Net    240 ml    Exam:  General: Alert pleasant Caucasian female looking younger than stated age. No icterus no pallor. Good dentition. Cardiovascular: JVD not appreciated. Radiating murmur from chest to right-side neck.  S1-S2,  ?2/6 murmur right upper sternal edge and possible 3 on 6 murmur left lower sternal edge. PMI does not seem to be displaced significantly telemetry = bradycardia with PACs. Respiratory: Clinically clear. No added sound. Abdomen: soft , non tendr, non-distended Neuro:CN 2-12 grossly intact.  Moving all 4 limbs equally Data Reviewed: Basic Metabolic Panel:  Lab 09/04/11 8119 09/03/11 0523 09/02/11 1509 09/02/11 0400 09/01/11 1600 09/01/11 0920  NA 134* 136 -- 135 133* 133*  K 4.7 4.1 -- -- -- --  CL 95* 98 -- 97 95* 97  CO2 30 24 -- 28 28 28   GLUCOSE 293* 213* -- 239* 245* 328*  BUN 15 10 -- 8 11 11   CREATININE 0.70 0.63 -- 0.58 0.60 0.56  CALCIUM 10.8* 10.6* -- 10.0 10.5 10.1  MG 1.8 1.4* 1.6 1.4* 1.8 --  PHOS -- -- -- -- -- --   Liver Function Tests:  Lab 09/01/11 0400  AST 11  ALT 9  ALKPHOS 72  BILITOT 0.4  PROT 6.5  ALBUMIN 2.6*   No results found for this basename: LIPASE:5,AMYLASE:5 in the last 168 hours No results found for this basename: AMMONIA:5 in the last 168 hours CBC:  Lab 09/01/11 0400 08/31/11 1319  WBC 9.9 11.3*  NEUTROABS -- 8.9*  HGB 11.3* 13.3  HCT 32.9* 37.2  MCV 82.9 81.9  PLT 244 257   Cardiac Enzymes:  Lab 09/01/11 0920 09/01/11 0400 08/31/11 2001 08/31/11 1320  CKTOTAL 24 25 24  --  CKMB 2.3 2.6 2.3 --  CKMBINDEX -- -- -- --  TROPONINI <0.30 <0.30 <0.30 <0.30   BNP: No components found with this basename: POCBNP:5 CBG:  Lab 09/04/11 1153 09/04/11 0744 09/03/11 2137 09/03/11 1608 09/03/11 1247  GLUCAP 182* 290* 136* 98 185*    Recent Results (from the past 240 hour(s))  URINE CULTURE     Status: Normal   Collection Time   08/31/11  1:06 PM      Component Value Range Status Comment   Specimen Description URINE, CATHETERIZED   Final    Special Requests NONE   Final    Culture  Setup Time [id number]   Final    Colony Count NO GROWTH   Final    Culture NO GROWTH   Final    Report Status 09/01/2011 FINAL   Final   MRSA PCR SCREENING      Status: Normal   Collection Time   08/31/11  7:33 PM      Component Value Range Status Comment   MRSA by PCR NEGATIVE  NEGATIVE  Final   URINE CULTURE     Status: Normal   Collection Time   08/31/11 11:32 PM      Component Value Range Status Comment   Specimen Description URINE, CLEAN CATCH   Final    Special Requests NONE   Final    Culture  Setup Time [id number]   Final    Colony Count >=100,000 COLONIES/ML   Final    Culture     Final    Value: Multiple bacterial morphotypes present, none predominant. Suggest appropriate recollection if clinically indicated.   Report Status 09/02/2011 FINAL   Final      Studies:              All Imaging reviewed and is as per above notation   Scheduled Meds:   . clopidogrel  75 mg Oral Daily  . colesevelam  1,250 mg Oral Daily  . digoxin  125 mcg Oral Daily  . diltiazem  360 mg Oral Daily  . dofetilide  250 mcg Oral Q12H  . furosemide  40 mg Oral BID  . insulin aspart  0-20 Units Subcutaneous TID WC  . insulin aspart  0-5 Units Subcutaneous QHS  . insulin aspart  8 Units Subcutaneous TID WC  . insulin aspart protamine-insulin aspart  20 Units Subcutaneous Q supper  . insulin aspart protamine-insulin aspart  34 Units Subcutaneous Q breakfast  . isosorbide mononitrate  30 mg Oral Daily  . latanoprost  1 drop Both Eyes QHS  . magnesium gluconate  500 mg Oral BID  . metoprolol  50 mg Oral BID  . omega-3 acid ethyl esters  1 g Oral Daily  . pantoprazole  40 mg Oral Q1200  . potassium chloride  40 mEq Oral Daily  . warfarin  5 mg Oral ONCE-1800  . warfarin  7.5 mg Oral ONCE-1800  . Warfarin - Pharmacist Dosing Inpatient   Does not apply q1800  . DISCONTD: furosemide  80 mg Oral BID   Continuous Infusions:    Assessment/Plan: 1. Atrial Fibrillation with rapid ventricular response-Continuing Tikosyn 250 mcg q12-QT interval 0.41 on this morning's EKG. Continue digoxin 125 mcg, Cardizem to 60 mg. Management per cardiologist. Is on  Coumadin. INR therapeutic at 2.7 2. Recent NSTEMI-continue Plavix, isosorbide mononitrate 30 mg. Consider lower dosages as on numerous nodal agent, and slightly bradycardic-per cardiologist 3. Diabetes mellitus-blood sugars range between 293 and 182. Continue 7030 insulin 34 units, resistant sliding scale coverage,  insulin 8 units with meals. 4. Hyperlipidemia-continue WelChol 1250 mg 5. Electrolytes-2 and a potassium as K. dear 40 mg equivalents daily, magnesium and 8 500 twice a day. 6. ? UTI-urine culture negative. No role for antibiotic 7. History of skin cancer 8. Weight loss-TSH 2.59. Outpatient reevaluation    Code Status: Full Family Communication: None in room Disposition Plan: Per cardiologist. Unclear as to when would be appropriate for discharge.   Pleas Koch, MD  [facility] Pager [phone] 09/04/2011, 1:58 PM    LOS: 4 days

## 2011-09-04 NOTE — Progress Notes (Signed)
ANTICOAGULATION CONSULT NOTE - Follow Up Consult  Pharmacy Consult for Coumadin Indication: atrial fibrillation  Allergies  Allergen Reactions  . Ace Inhibitors Cough    Enalapril  benicor    . Crestor (Rosuvastatin Calcium)     Fatigue and leg pain   . Lipitor (Atorvastatin Calcium)     Myalgias   . Niaspan (Niacin)     Intol   . Olmesartan   . Prednisone     REACTION: "retained fluid"    Patient Measurements: Height: 5\' 4"  (162.6 cm) Weight: 153 lb 3.5 oz (69.5 kg) IBW/kg (Calculated) : 54.7  Heparin Dosing Weight:   Vital Signs: Temp: 98.6 F (37 C) (03/09 0500) Temp src: Oral (03/09 0500) BP: 161/62 mmHg (03/09 0500) Pulse Rate: 76  (03/09 0500)  Labs:  Basename 09/04/11 0450 09/03/11 0523 09/02/11 0400 09/01/11 0920  HGB -- -- -- --  HCT -- -- -- --  PLT -- -- -- --  APTT -- -- -- --  LABPROT 29.7* 32.4* 34.9* --  INR 2.77* 3.10* 3.41* --  HEPARINUNFRC -- -- -- --  CREATININE 0.70 0.63 0.58 --  CKTOTAL -- -- -- 24  CKMB -- -- -- 2.3  TROPONINI -- -- -- <0.30   Estimated Creatinine Clearance: 54.6 ml/min (by C-G formula based on Cr of 0.7).  Assessment: 81yof on Coumadin for afib. INR (2.77) is now therapeutic after decreasing into range overnight. Patient's INR originally jumped while on home regimen (5mg  daily except 10mg  Tue/Thur/Sat), will plan to give reduced dose tonight and follow-up AM INR. Tikosyn initiated 3/6.  - No CBC this AM - No significant bleeding reported - Magnesium 1.8, K 4.7, CrCl 72 ml/min  Goal of Therapy:  INR 2-3 Magnesium > 1.8 K > 4  Plan:  1. Coumadin 7.5mg  po x 1 today 2. Follow up AM INR and labs  Cleon Dew [phone] 09/04/2011,7:36 AM

## 2011-09-04 NOTE — Progress Notes (Signed)
Patient ID: Alexandria [name], female   DOB: 07-01-1929, 78 y.o.   MRN: [id number] Subjective:  No chest pain, sob, or palpitations.  Objective:  Vital Signs in the last 24 hours: Temp:  [98.1 F (36.7 C)-99.1 F (37.3 C)] 98.6 F (37 C) (03/09 0500) Pulse Rate:  [66-81] 76  (03/09 1010) Resp:  [18] 18  (03/09 0500) BP: (128-169)/(45-100) 169/68 mmHg (03/09 1010) SpO2:  [91 %-97 %] 92 % (03/09 0500) Weight:  [69.5 kg (153 lb 3.5 oz)] 69.5 kg (153 lb 3.5 oz) (03/09 0500)  Intake/Output from previous day: 03/08 0701 - 03/09 0700 In: 820 [P.O.:720; IV Piggyback:100] Out: 230 [Urine:230] Intake/Output from this shift:    Physical Exam: Well appearing elderly woman, NAD HEENT: Unremarkable Neck:  No JVD, no thyromegally Lymphatics:  No adenopathy Back:  No CVA tenderness Lungs:  Clear with no wheezes. HEART:  Regular rate rhythm, no murmurs, no rubs, no clicks Abd:  Flat, positive bowel sounds, no organomegally, no rebound, no guarding Ext:  2 plus pulses, no edema, no cyanosis, no clubbing Skin:  No rashes no nodules Neuro:  CN II through XII intact, motor grossly intact  Lab Results: No results found for this basename: WBC:2,HGB:2,PLT:2 in the last 72 hours  Basename 09/04/11 0450 09/03/11 0523  NA 134* 136  K 4.7 4.1  CL 95* 98  CO2 30 24  GLUCOSE 293* 213*  BUN 15 10  CREATININE 0.70 0.63   No results found for this basename: TROPONINI:2,CK,MB:2 in the last 72 hours Hepatic Function Panel No results found for this basename: PROT,ALBUMIN,AST,ALT,ALKPHOS,BILITOT,BILIDIR,IBILI in the last 72 hours No results found for this basename: CHOL in the last 72 hours No results found for this basename: PROTIME in the last 72 hours  Imaging: No results found.  Cardiac Studies: 12 lead ECG - NSR with QTc 460. Assessment/Plan:  1. Atrial fib - she appears to be maintaining NSR on Tikosyn. She will continue her current dose of 250 mcg twice daily.  2. Diastolic CHF - will reduce  her lasix to 40 mg twice daily.   LOS: 4 days    Lewayne Bunting 09/04/2011, 11:36 AM

## 2011-09-05 ENCOUNTER — Inpatient Hospital Stay (HOSPITAL_COMMUNITY): Payer: Medicare Other

## 2011-09-05 ENCOUNTER — Other Ambulatory Visit: Payer: Self-pay

## 2011-09-05 LAB — BASIC METABOLIC PANEL
BUN: 17 mg/dL (ref 6–23)
CO2: 28 mEq/L (ref 19–32)
Chloride: 95 mEq/L — ABNORMAL LOW (ref 96–112)
Creatinine, Ser: 0.69 mg/dL (ref 0.50–1.10)

## 2011-09-05 LAB — MAGNESIUM: Magnesium: 1.6 mg/dL (ref 1.5–2.5)

## 2011-09-05 LAB — GLUCOSE, CAPILLARY
Glucose-Capillary: 215 mg/dL — ABNORMAL HIGH (ref 70–99)
Glucose-Capillary: 109 mg/dL — ABNORMAL HIGH (ref 70–99)

## 2011-09-05 MED ORDER — POTASSIUM CHLORIDE CRYS ER 20 MEQ PO TBCR
60.0000 meq | EXTENDED_RELEASE_TABLET | Freq: Two times a day (BID) | ORAL | Status: DC
  Administered 2011-09-05 – 2011-09-06 (×2): 60 meq via ORAL
  Filled 2011-09-05 (×2): qty 3

## 2011-09-05 MED ORDER — POTASSIUM CHLORIDE CRYS ER 20 MEQ PO TBCR
EXTENDED_RELEASE_TABLET | ORAL | Status: AC
  Filled 2011-09-05: qty 3

## 2011-09-05 MED ORDER — DOFETILIDE 125 MCG PO CAPS
125.0000 ug | ORAL_CAPSULE | Freq: Two times a day (BID) | ORAL | Status: DC
  Administered 2011-09-05 – 2011-09-06 (×2): 125 ug via ORAL
  Filled 2011-09-05 (×4): qty 1

## 2011-09-05 MED ORDER — WARFARIN SODIUM 10 MG PO TABS
10.0000 mg | ORAL_TABLET | Freq: Once | ORAL | Status: AC
  Administered 2011-09-05: 10 mg via ORAL
  Filled 2011-09-05: qty 1

## 2011-09-05 MED ORDER — DILTIAZEM HCL ER COATED BEADS 240 MG PO CP24
240.0000 mg | ORAL_CAPSULE | Freq: Every day | ORAL | Status: DC
  Administered 2011-09-06: 240 mg via ORAL
  Filled 2011-09-05: qty 1

## 2011-09-05 MED ORDER — FUROSEMIDE 40 MG PO TABS
40.0000 mg | ORAL_TABLET | Freq: Every day | ORAL | Status: DC
Start: 2011-09-06 — End: 2011-09-06
  Administered 2011-09-06: 40 mg via ORAL
  Filled 2011-09-05: qty 1

## 2011-09-05 NOTE — Progress Notes (Signed)
ANTICOAGULATION CONSULT NOTE - Follow Up Consult  Pharmacy Consult for Coumadin Indication: atrial fibrillation  Allergies  Allergen Reactions  . Ace Inhibitors Cough    Enalapril  benicor    . Crestor (Rosuvastatin Calcium)     Fatigue and leg pain   . Lipitor (Atorvastatin Calcium)     Myalgias   . Niaspan (Niacin)     Intol   . Olmesartan   . Prednisone     REACTION: "retained fluid"    Patient Measurements: Height: 5\' 4"  (162.6 cm) Weight: 155 lb 10.3 oz (70.6 kg) IBW/kg (Calculated) : 54.7  Heparin Dosing Weight:   Vital Signs: Temp: 98.5 F (36.9 C) (03/10 0505) BP: 149/52 mmHg (03/10 0505) Pulse Rate: 66  (03/10 0505)  Labs:  Basename 09/05/11 1610 09/04/11 0450 09/03/11 0523  HGB -- -- --  HCT -- -- --  PLT -- -- --  APTT -- -- --  LABPROT 24.5* 29.7* 32.4*  INR 2.16* 2.77* 3.10*  HEPARINUNFRC -- -- --  CREATININE 0.69 0.70 0.63  CKTOTAL -- -- --  CKMB -- -- --  TROPONINI -- -- --   Estimated Creatinine Clearance: 55 ml/min (by C-G formula based on Cr of 0.69).  Assessment: 81yof on Coumadin for Afib. INR (2.16) is therapeutic but dropped with 7.5mg  dose 3/9. Will plan to give 10mg  tonight and restart home regimen tomorrow if INR is appropriate. Tikosyn initiated on 3/6 and is currently dosed at q12h. Mg (1.5) and K (3.8) are low this AM, RN is contacting MD for replacement orders.  - No CBC since 3/6 - No significant bleeding reported - CrCl ~ 63 ml/min  Goal of Therapy:  INR 2-3 Magnesium > 1.8  K > 4  Plan:  1. Coumadin 10mg  po x 1 today, resume home regimen (5mg  daily except 10mg  Tue/Thur/Sat) tomorrow if INR appropriate 2. Follow-up electrolytes and replacement orders 3. Follow-up AM INR and labs   Cleon Dew [phone] 09/05/2011,8:07 AM

## 2011-09-05 NOTE — Progress Notes (Signed)
Patient ID: Alexandria [name], female   DOB: 1930/05/15, 78 y.o.   MRN: [id number]  Subjective:  No chest pain, sob, or palpitations.  Objective:  Vital Signs in the last 24 hours: Temp:  [98.5 F (36.9 C)-99.3 F (37.4 C)] 98.5 F (36.9 C) (03/10 0505) Pulse Rate:  [66-75] 75  (03/10 1018) Resp:  [14-20] 14  (03/10 0505) BP: (136-149)/(49-57) 147/49 mmHg (03/10 1016) SpO2:  [91 %-93 %] 91 % (03/10 0505) Weight:  [70.6 kg (155 lb 10.3 oz)] 70.6 kg (155 lb 10.3 oz) (03/10 0505)  Intake/Output from previous day: 03/09 0701 - 03/10 0700 In: -  Out: 200 [Urine:200] Intake/Output from this shift:    Physical Exam: Well appearing elderly woman, NAD HEENT: Unremarkable Neck:  No JVD, no thyromegally Lymphatics:  No adenopathy Back:  No CVA tenderness Lungs:  Clear with no wheezes. HEART:  Regular rate rhythm, no murmurs, no rubs, no clicks Abd:  Flat, positive bowel sounds, no organomegally, no rebound, no guarding Ext:  2 plus pulses, no edema, no cyanosis, no clubbing Skin:  No rashes no nodules Neuro:  CN II through XII intact, motor grossly intact  Lab Results: No results found for this basename: WBC:2,HGB:2,PLT:2 in the last 72 hours  Basename 09/05/11 0638 09/04/11 0450  NA 134* 134*  K 3.8 4.7  CL 95* 95*  CO2 28 30  GLUCOSE 225* 293*  BUN 17 15  CREATININE 0.69 0.70   No results found for this basename: TROPONINI:2,CK,MB:2 in the last 72 hours Hepatic Function Panel No results found for this basename: PROT,ALBUMIN,AST,ALT,ALKPHOS,BILITOT,BILIDIR,IBILI in the last 72 hours No results found for this basename: CHOL in the last 72 hours No results found for this basename: PROTIME in the last 72 hours  Imaging: No results found.  Cardiac Studies: 12 lead ECG - NSR with QTc 490. Assessment/Plan:  1. Atrial fib - she appears to be maintaining NSR on Tikosyn. Her QTC has prolonged. I have reduced her Tikosyn dose to 125 mcg q12. Will follow for another day. 2.  Diastolic CHF - will reduce her lasix to 40 mg daily.   LOS: 5 days    Lewayne Bunting 09/05/2011, 11:09 AM

## 2011-09-05 NOTE — Progress Notes (Signed)
Pharmacy requesting for KCl and Magnesium supplementation. K level = 3.8 and Magnesium level at 1.5. Thanks ! Ancil Linsey RN

## 2011-09-05 NOTE — Progress Notes (Signed)
PROGRESS NOTE  Alexandria [name] EAV:[id number] DOB: 1929-11-09 DOA: 08/31/2011 PCP: [name], MD, MD  Pulmonologist-alva Cardiologist-Hochrein  Brief narrative: 78 year old Caucasian female admitted 3/5 with atrial fibrillation and RVR, near syncope  Past medical history: CAD s/p NSTEMI+DES 08/13/11, hypertension, diabetes mellitus, hyperlipidemia, atrial fibrillation, ??interstitial lung disease with PFTs [TLC 61%, DLCO 40%, FVC 52%] discontinued off oxygen by primary pulmonologist, carotid stenosis 6/12 1-39% bilateral ICA stenosis, glaucoma, skin cancer, B12 deficiency,  EF 65% per cardiac cath 07/2011  Consultants:  Cardiology  Procedures:  Chest x-ray 3/5 = stable cardiomegaly with probable mild chronic interstitial change but no active process  CT scan 3/5 = no acute abnormalities. Atrophy and chronic small vessel ischemic changes  UC multiple bacteria +, >100,000  Antibiotics:  none   Subjective  Doing well. No chest pain or shortness of breath. Felt a little chilly this morning. Wants to know when she can go home   Objective   Interim History: History of present illness is reviewed, progress notes and cardiology consult notes reviewed  Objective: Filed Vitals:   09/04/11 1400 09/04/11 2030 09/04/11 2213 09/05/11 0505  BP: 136/57 138/51 138/51 149/52  Pulse: 73 68  66  Temp: 99.3 F (37.4 C) 99 F (37.2 C)  98.5 F (36.9 C)  TempSrc: Oral     Resp: 20 18  14   Height:      Weight:    70.6 kg (155 lb 10.3 oz)  SpO2: 93% 93%  91%    Intake/Output Summary (Last 24 hours) at 09/05/11 1010 Last data filed at 09/05/11 0700  Gross per 24 hour  Intake      0 ml  Output    200 ml  Net   -200 ml    Exam:  General: Alert pleasant Caucasian female looking younger than stated age. No icterus no pallor. Good dentition. Cardiovascular: JVD not appreciated. Radiating murmur from chest to right-side neck.  S1-S2, ?2/6 murmur right upper sternal edge and possible 3  on 6 murmur left lower sternal edge. PMI does not seem to be displaced significantly telemetry = heart rate in 60s and 70s with PACs. Respiratory: Some crackles to the right lower chest area. Abdomen: soft , non tender, non-distended Neuro:CN 2-12 grossly intact.  Moving all 4 limbs equally   Data Reviewed:- Basic Metabolic Panel:  Lab 09/05/11 7829 09/04/11 0450 09/03/11 0523 09/02/11 1509 09/02/11 0400 09/01/11 1600  NA 134* 134* 136 -- 135 133*  K 3.8 4.7 -- -- -- --  CL 95* 95* 98 -- 97 95*  CO2 28 30 24  -- 28 28  GLUCOSE 225* 293* 213* -- 239* 245*  BUN 17 15 10  -- 8 11  CREATININE 0.69 0.70 0.63 -- 0.58 0.60  CALCIUM 10.4 10.8* 10.6* -- 10.0 10.5  MG 1.5 1.8 1.4* 1.6 1.4* --  PHOS -- -- -- -- -- --   Liver Function Tests:  Lab 09/01/11 0400  AST 11  ALT 9  ALKPHOS 72  BILITOT 0.4  PROT 6.5  ALBUMIN 2.6*   No results found for this basename: LIPASE:5,AMYLASE:5 in the last 168 hours No results found for this basename: AMMONIA:5 in the last 168 hours CBC:  Lab 09/01/11 0400 08/31/11 1319  WBC 9.9 11.3*  NEUTROABS -- 8.9*  HGB 11.3* 13.3  HCT 32.9* 37.2  MCV 82.9 81.9  PLT 244 257   Cardiac Enzymes:  Lab 09/01/11 0920 09/01/11 0400 08/31/11 2001 08/31/11 1320  CKTOTAL 24 25 24  --  CKMB 2.3  2.6 2.3 --  CKMBINDEX -- -- -- --  TROPONINI <0.30 <0.30 <0.30 <0.30   BNP: No components found with this basename: POCBNP:5 CBG:  Lab 09/05/11 0726 09/04/11 2141 09/04/11 1745 09/04/11 1153 09/04/11 0744  GLUCAP 215* 149* 166* 182* 290*    Recent Results (from the past 240 hour(s))  URINE CULTURE     Status: Normal   Collection Time   08/31/11  1:06 PM      Component Value Range Status Comment   Specimen Description URINE, CATHETERIZED   Final    Special Requests NONE   Final    Culture  Setup Time [id number]   Final    Colony Count NO GROWTH   Final    Culture NO GROWTH   Final    Report Status 09/01/2011 FINAL   Final   MRSA PCR SCREENING     Status: Normal    Collection Time   08/31/11  7:33 PM      Component Value Range Status Comment   MRSA by PCR NEGATIVE  NEGATIVE  Final   URINE CULTURE     Status: Normal   Collection Time   08/31/11 11:32 PM      Component Value Range Status Comment   Specimen Description URINE, CLEAN CATCH   Final    Special Requests NONE   Final    Culture  Setup Time [id number]   Final    Colony Count >=100,000 COLONIES/ML   Final    Culture     Final    Value: Multiple bacterial morphotypes present, none predominant. Suggest appropriate recollection if clinically indicated.   Report Status 09/02/2011 FINAL   Final      Studies:              All Imaging reviewed and is as per above notation   Scheduled Meds:    . clopidogrel  75 mg Oral Daily  . colesevelam  1,250 mg Oral Daily  . digoxin  125 mcg Oral Daily  . diltiazem  360 mg Oral Daily  . dofetilide  250 mcg Oral Q12H  . furosemide  40 mg Oral BID  . insulin aspart  0-20 Units Subcutaneous TID WC  . insulin aspart  0-5 Units Subcutaneous QHS  . insulin aspart  8 Units Subcutaneous TID WC  . insulin aspart protamine-insulin aspart  20 Units Subcutaneous Q supper  . insulin aspart protamine-insulin aspart  34 Units Subcutaneous Q breakfast  . isosorbide mononitrate  30 mg Oral Daily  . latanoprost  1 drop Both Eyes QHS  . magnesium gluconate  500 mg Oral BID  . metoprolol  50 mg Oral BID  . omega-3 acid ethyl esters  1 g Oral Daily  . pantoprazole  40 mg Oral Q1200  . potassium chloride  40 mEq Oral Daily  . warfarin  10 mg Oral ONCE-1800  . warfarin  7.5 mg Oral ONCE-1800  . Warfarin - Pharmacist Dosing Inpatient   Does not apply q1800  . DISCONTD: furosemide  80 mg Oral BID   Continuous Infusions:    Assessment/Plan: 1. Atrial Fibrillation with rapid ventricular response-Continuing Tikosyn 250 mcg q12-QT interval 0. 5 on this morning's EKG--cardiologist aware and will adjust appropriately.  Continue digoxin 125 mcg (note digoxin level was 0.7  on the sixth), Cardizem to 60 mg. Is on Coumadin. INR therapeutic at 2.16 2. Recent NSTEMI-continue Plavix, isosorbide mononitrate 30 mg. Consider lower dosages as on numerous nodal agent, and slightly bradycardic-per cardiologist. Duration  of anticoagulation to be carefully adjusted given she is on Coumadin as well 3. Diabetes mellitus-blood sugars range between 293 and 182. Continue 70/30 insulin 34 units, resistant sliding scale coverage, insulin 8 units with meals. 4. Hyperlipidemia-continue WelChol 1250 mg 5. Electrolytes-continue potassium as K. Dur 40 mg equivalents daily, magnesium and 500 twice a day. 6. ? UTI-urine culture negative. No role for antibiotic 7. CHF, diastolic with EF 96% by cardiac cath [chronic]-given crackles right lung fields will get chest x-ray. Might need diuresis. Would hesitate to escalate given propensity to alter her electrolytes 8. History of skin cancer 9. Weight loss-TSH 2.59. Outpatient reevaluation    Code Status: Full Family Communication: None in room-she states she is perfectly capable of updating family by herself Disposition Plan: Per cardiologist. Unclear as to when would be appropriate for discharge.   Pleas Koch, MD  [facility] Pager [phone] 09/05/2011, 10:10 AM    LOS: 5 days

## 2011-09-06 ENCOUNTER — Other Ambulatory Visit: Payer: Self-pay

## 2011-09-06 LAB — BASIC METABOLIC PANEL
CO2: 24 mEq/L (ref 19–32)
Calcium: 11 mg/dL — ABNORMAL HIGH (ref 8.4–10.5)
Creatinine, Ser: 0.66 mg/dL (ref 0.50–1.10)
Glucose, Bld: 230 mg/dL — ABNORMAL HIGH (ref 70–99)
Sodium: 133 mEq/L — ABNORMAL LOW (ref 135–145)

## 2011-09-06 LAB — GLUCOSE, CAPILLARY: Glucose-Capillary: 417 mg/dL — ABNORMAL HIGH (ref 70–99)

## 2011-09-06 MED ORDER — MAGNESIUM GLUCONATE 500 MG PO TABS: 500.0000 mg | ORAL_TABLET | Freq: Two times a day (BID) | ORAL | Status: DC

## 2011-09-06 MED ORDER — POTASSIUM CHLORIDE CRYS ER 20 MEQ PO TBCR: 40.0000 meq | EXTENDED_RELEASE_TABLET | Freq: Two times a day (BID) | ORAL | Status: DC

## 2011-09-06 MED ORDER — INSULIN ASPART 100 UNIT/ML ~~LOC~~ SOLN: 20.0000 [IU] | Freq: Every day | SUBCUTANEOUS | Status: DC

## 2011-09-06 MED ORDER — DOFETILIDE 125 MCG PO CAPS: 125.0000 ug | ORAL_CAPSULE | Freq: Two times a day (BID) | ORAL | Status: DC

## 2011-09-06 MED ORDER — FUROSEMIDE 40 MG PO TABS: 40.0000 mg | ORAL_TABLET | Freq: Two times a day (BID) | ORAL | Status: DC

## 2011-09-06 MED ORDER — INSULIN ASPART PROT & ASPART (70-30 MIX) 100 UNIT/ML ~~LOC~~ SUSP: 20.0000 [IU] | Freq: Every day | SUBCUTANEOUS | Status: DC

## 2011-09-06 MED ORDER — DILTIAZEM HCL ER COATED BEADS 240 MG PO CP24: 240.0000 mg | ORAL_CAPSULE | Freq: Every day | ORAL | Status: DC

## 2011-09-06 MED ORDER — INSULIN ASPART PROT & ASPART (70-30 MIX) 100 UNIT/ML ~~LOC~~ SUSP
34.0000 [IU] | Freq: Every day | SUBCUTANEOUS | Status: DC
  Administered 2011-09-06: 34 [IU] via SUBCUTANEOUS
  Filled 2011-09-06: qty 3

## 2011-09-06 MED ORDER — WARFARIN SODIUM 5 MG PO TABS
5.0000 mg | ORAL_TABLET | Freq: Once | ORAL | Status: DC
  Filled 2011-09-06: qty 1

## 2011-09-06 MED ORDER — INSULIN ASPART 100 UNIT/ML ~~LOC~~ SOLN
34.0000 [IU] | Freq: Every day | SUBCUTANEOUS | Status: DC
  Filled 2011-09-06: qty 0.34

## 2011-09-06 NOTE — Progress Notes (Signed)
SUBJECTIVE:  "I feel pretty good."  No chest pain.  No SOB.  She is complaining of burning with urination.   PHYSICAL EXAM Filed Vitals:   09/05/11 1400 09/05/11 2155 09/05/11 2200 09/06/11 0600  BP: 133/52 154/62 142/53 157/49  Pulse: 73 82 72 69  Temp: 99.6 F (37.6 C)  99.2 F (37.3 C) 98.6 F (37 C)  TempSrc: Oral     Resp: 16  18 18   Height:      Weight:    71 kg (156 lb 8.4 oz)  SpO2: 93%  92% 97%   General:  No acute distress Lungs: Few basilar crackles Heart:  RRR Abdomen:  Positive bowel sounds, no rebound no guarding Extremities:  No edema.  LABS: Lab Results  Component Value Date   CKTOTAL 24 09/01/2011   CKMB 2.3 09/01/2011   TROPONINI <0.30 09/01/2011   Results for orders placed during the hospital encounter of 08/31/11 (from the past 24 hour(s))  GLUCOSE, CAPILLARY     Status: Abnormal   Collection Time   09/05/11 11:27 AM      Component Value Range   Glucose-Capillary 109 (*) 70 - 99 (mg/dL)   Comment 1 Notify RN    MAGNESIUM     Status: Normal   Collection Time   09/05/11 12:05 PM      Component Value Range   Magnesium 1.6  1.5 - 2.5 (mg/dL)  GLUCOSE, CAPILLARY     Status: Abnormal   Collection Time   09/05/11  4:48 PM      Component Value Range   Glucose-Capillary 203 (*) 70 - 99 (mg/dL)   Comment 1 Notify RN    GLUCOSE, CAPILLARY     Status: Abnormal   Collection Time   09/05/11  9:02 PM      Component Value Range   Glucose-Capillary 190 (*) 70 - 99 (mg/dL)  PROTIME-INR     Status: Abnormal   Collection Time   09/06/11  6:05 AM      Component Value Range   Prothrombin Time 26.3 (*) 11.6 - 15.2 (seconds)   INR 2.37 (*) 0.00 - 1.49   BASIC METABOLIC PANEL     Status: Abnormal   Collection Time   09/06/11  6:05 AM      Component Value Range   Sodium 133 (*) 135 - 145 (mEq/L)   Potassium 4.8  3.5 - 5.1 (mEq/L)   Chloride 99  96 - 112 (mEq/L)   CO2 24  19 - 32 (mEq/L)   Glucose, Bld 230 (*) 70 - 99 (mg/dL)   BUN 14  6 - 23 (mg/dL)   Creatinine, Ser 7.84  0.50 - 1.10 (mg/dL)   Calcium 69.6 (*) 8.4 - 10.5 (mg/dL)   GFR calc non Af Amer 82 (*) >90 (mL/min)   GFR calc Af Amer >90  >90 (mL/min)  MAGNESIUM     Status: Normal   Collection Time   09/06/11  6:05 AM      Component Value Range   Magnesium 1.5  1.5 - 2.5 (mg/dL)    Intake/Output Summary (Last 24 hours) at 09/06/11 0829 Last data filed at 09/05/11 1200  Gross per 24 hour  Intake    240 ml  Output      0 ml  Net    240 ml    ASSESSMENT AND PLAN:   1)  A-fib:  Tikosyn dose reduced to 125 mcg q12 yesterday.  EKG reviewed today and QTc is about  420. Telemetry without arrhythmia.  She would be OK to go home on the low dose of Tikosyn.  However, she will need po magnesium to take daily as this is low normal.    2)  HYPERTENSION: BP is slightly elevated.  Continue current medications    3) CAD:  No evidence of ischemia.  No change in therapy.   Fayrene Fearing [facility] 09/06/2011 8:29 AM

## 2011-09-06 NOTE — Progress Notes (Signed)
09/06/2011 Fransico Michael SPARKS Case Management Note [phone]  HOME HEALTH AGENCIES SERVING [facility]   Agencies that are Medicare-Certified and are affiliated with [facility]  Telephone Number Address  [facility]   The [facility] has ownership interest in this company; however, you are under no obligation to use this agency. [phone] or  [phone] [address] [place], [place] [address]   Agencies that are Medicare-Certified and are not affiliated with [facility] Agency Telephone Number Address  [facility] [phone] Fax [phone] [address], [address] [place], [place]  [address]  [facility] [phone] or [phone] Fax [phone] [address] [place], [place] [address]  [facility] [phone] Fax [phone] [address] [place], [place] [address]  [facility] [phone] Fax [phone] [address] [place], [place]  [address]  [facility] [phone] Fax [phone] [address] [place], [place] [address]  [facility] [phone] [address] [place], [place] [address]  [facility] [phone]  [address] [place], [place] [address]  [facility] [phone] or [phone] Fax [phone] [address] [place], [place]  [address]  [facility] [phone] Fax [phone] [address] [place], [place]  [address]  [facility]  [phone] Fax [phone] [address] [place], [place] [address]               Agencies that are not Medicare-Certified and are not affiliated with [facility] Agency Telephone Number Address  [facility] [place] [phone] or [phone] Fax [phone] [address] [place], [place]  [address]  [facility] [phone] Fax [phone] [address] [place], [place]  [address]  [facility]  [phone] Fax [phone] [address] [place], [place] [address]  [facility] [phone] Fax [phone] [address] [place], [place] [address]  [facility] [phone] or [phone] Fax [phone] [address] [place], [place]  [address]  [facility] [phone] or [phone] Fax [phone] [address], Suite [place], [place]  [address]  [facility] [phone] Fax [phone] [address] [place], [place]  [address]  [facility] [phone] [address] [place], [place]  [address]  [facility] [phone] Fax [phone] [address] [place], [place]  [address]  [facility] [phone] Fax [phone] [address] [place], [place]  [address]  [facility] [phone] Fax [phone] [address] [place], [place] [address]  [facility] [phone] Fax [phone] [address] [place], [place]  [address]   In to speak with patient regarding home health RN for  disease management and education. Choice of facilities given. [facility]. Appropriate referral to be made.

## 2011-09-06 NOTE — Progress Notes (Signed)
09/06/2011 [facility], Bosie Clos SPARKS Case Management Note [phone]    CARE MANAGEMENT NOTE 09/06/2011  Patient:  Alexandria [name], Alexandria [name]   Account Number:  [id number]  Date Initiated:  09/06/2011  Documentation initiated by:  Fransico Michael  Subjective/Objective Assessment:   admitted on 08/31/11 with generalized weakness and near syncope     Action/Plan:   prior to admission, patient lived at home with support of friends. Independent with ADLs   Anticipated DC Date:  09/06/2011   Anticipated DC Plan:  HOME W HOME HEALTH SERVICES      DC Planning Services  CM consult      [facility] Choice  HOME HEALTH   Choice offered to / List presented to:  C-1 Patient        HH arranged  HH-1 RN      [facility] agency  [facility]   Status of service:  Completed, signed off Medicare Important Message given?   (If response is "NO", the following Medicare IM given date fields will be blank) Date Medicare IM given:   Date Additional Medicare IM given:    Discharge Disposition:  HOME W HOME HEALTH SERVICES  Per UR Regulation:  Reviewed for med. necessity/level of care/duration of stay  Comments:  09/06/11-1228-J.Thoma Paulsen,RN,BSN  [phone]      In to see patient regarding MD recommendation for home health RN for disease management and education. Choice of facilities given to patient. [facility]. Hilda Lias, RN with [facility] notified of referral. No further discharge needs identified.

## 2011-09-06 NOTE — Discharge Instructions (Signed)
Atrial Fibrillation Your caregiver has diagnosed you with atrial fibrillation (AFib). The heart normally beats very regularly; AFib is a type of irregular heartbeat. The heart rate may be faster or slower than normal. This can prevent your heart from pumping as well as it should. AFib can be constant (chronic) or intermittent (paroxysmal). CAUSES  Atrial fibrillation may be caused by:  Heart disease, including heart attack, coronary artery disease, heart failure, diseases of the heart valves, and others.   Blood clot in the lungs (pulmonary embolism).   Pneumonia or other infections.   Chronic lung disease.   Thyroid disease.   Toxins. These include alcohol, some medications (such as decongestant medications or diet pills), and caffeine.  In some people, no cause for AFib can be found. This is referred to as Lone Atrial Fibrillation. SYMPTOMS   Palpitations or a fluttering in your chest.   A vague sense of chest discomfort.   Shortness of breath.   Sudden onset of lightheadedness or weakness.  Sometimes, the first sign of AFib can be a complication of the condition. This could be a stroke or heart failure. DIAGNOSIS  Your description of your condition may make your caregiver suspicious of atrial fibrillation. Your caregiver will examine your pulse to determine if fibrillation is present. An EKG (electrocardiogram) will confirm the diagnosis. Further testing may help determine what caused you to have atrial fibrillation. This may include chest x-ray, echocardiogram, blood tests, or CT scans. PREVENTION  If you have previously had atrial fibrillation, your caregiver may advise you to avoid substances known to cause the condition (such as stimulant medications, and possibly caffeine or alcohol). You may be advised to use medications to prevent recurrence. Proper treatment of any underlying condition is important to help prevent recurrence. PROGNOSIS  Atrial fibrillation does tend to  become a chronic condition over time. It can cause significant complications (see below). Atrial fibrillation is not usually immediately life-threatening, but it can shorten your life expectancy. This seems to be worse in women. If you have lone atrial fibrillation and are under 60 years old, the risk of complications is very low, and life expectancy is not shortened. RISKS AND COMPLICATIONS  Complications of atrial fibrillation can include stroke, chest pain, and heart failure. Your caregiver will recommend treatments for the atrial fibrillation, as well as for any underlying conditions, to help minimize risk of complications. TREATMENT  Treatment for AFib is divided into several categories:  Treatment of any underlying condition.   Converting you out of AFib into a regular (sinus) rhythm.   Controlling rapid heart rate.   Prevention of blood clots and stroke.  Medications and procedures are available to convert your atrial fibrillation to sinus rhythm. However, recent studies have shown that this may not offer you any advantage, and cardiac experts are continuing research and debate on this topic. More important is controlling your rapid heartbeat. The rapid heartbeat causes more symptoms, and places strain on your heart. Your caregiver will advise you on the use of medications that can control your heart rate. Atrial fibrillation is a strong stroke risk. You can lessen this risk by taking blood thinning medications such as Coumadin (warfarin), or sometimes aspirin. These medications need close monitoring by your caregiver. Over-medication can cause bleeding. Too little medication may not protect against stroke. HOME CARE INSTRUCTIONS   If your caregiver prescribed medicine to make your heartbeat more normally, take as directed.   If blood thinners were prescribed by your caregiver, take EXACTLY as directed.     Perform blood tests EXACTLY as directed.   Quit smoking. Smoking increases your  cardiac and lung (pulmonary) risks.   DO NOT drink alcohol.   DO NOT drink caffeinated drinks (e.g. coffee, soda, chocolate, and leaf teas). You may drink decaffeinated coffee, soda or tea.   If you are overweight, you should choose a reduced calorie diet to lose weight. Please see a registered dietitian if you need more information about healthy weight loss. DO NOT USE DIET PILLS as they may aggravate heart problems.   If you have other heart problems that are causing AFib, you may need to eat a low salt, fat, and cholesterol diet. Your caregiver will tell you if this is necessary.   Exercise every day to improve your physical fitness. Stay active unless advised otherwise.   If your caregiver has given you a follow-up appointment, it is very important to keep that appointment. Not keeping the appointment could result in heart failure or stroke. If there is any problem keeping the appointment, you must call back to this facility for assistance.  SEEK MEDICAL CARE IF:  You notice a change in the rate, rhythm or strength of your heartbeat.   You develop an infection or any other change in your overall health status.  SEEK IMMEDIATE MEDICAL CARE IF:   You develop chest pain, abdominal pain, sweating, weakness or feel sick to your stomach (nausea).   You develop shortness of breath.   You develop swollen feet and ankles.   You develop dizziness, numbness, or weakness of your face or limbs, or any change in vision or speech.  MAKE SURE YOU:   Understand these instructions.   Will watch your condition.   Will get help right away if you are not doing well or get worse.  Document Released: 06/14/2005 Document Revised: 06/03/2011 Document Reviewed: 01/17/2008 Exit[employer] Patient Information 2012 [employer]. 

## 2011-09-06 NOTE — Progress Notes (Signed)
PT discharged by wheelchair to private vehicle home tikosyn in hand. V/S stable

## 2011-09-06 NOTE — Progress Notes (Signed)
ANTICOAGULATION CONSULT NOTE - Follow Up Consult  Pharmacy Consult for Coumadin Indication: atrial fibrillation  Allergies  Allergen Reactions  . Ace Inhibitors Cough    Enalapril  benicor    . Crestor (Rosuvastatin Calcium)     Fatigue and leg pain   . Lipitor (Atorvastatin Calcium)     Myalgias   . Niaspan (Niacin)     Intol   . Olmesartan   . Prednisone     REACTION: "retained fluid"    Patient Measurements: Height: 5\' 4"  (162.6 cm) Weight: 156 lb 8.4 oz (71 kg) IBW/kg (Calculated) : 54.7  Heparin Dosing Weight:   Vital Signs: Temp: 98.6 F (37 C) (03/11 0600) BP: 157/49 mmHg (03/11 0600) Pulse Rate: 69  (03/11 0600)  Labs:  Basename 09/06/11 0605 09/05/11 6578 09/04/11 0450  HGB -- -- --  HCT -- -- --  PLT -- -- --  APTT -- -- --  LABPROT 26.3* 24.5* 29.7*  INR 2.37* 2.16* 2.77*  HEPARINUNFRC -- -- --  CREATININE 0.66 0.69 0.70  CKTOTAL -- -- --  CKMB -- -- --  TROPONINI -- -- --   Estimated Creatinine Clearance: 55.1 ml/min (by C-G formula based on Cr of 0.66).  Assessment: 81yof on Coumadin for Afib. INR is therapeutic. Tikosyn initiated on 3/6 and has been reduced to q12h. - No CBC since 3/6 - No significant bleeding reported - CrCl ~ 55 ml/min - Her potassium has increased significantly but her Magnesium remains borderline low for concomitant Tikosyn therapy.  Goal of Therapy:  INR 2-3 Magnesium > 1.8  K > 4  Plan:  1. Coumadin 5mg  today.  If she is discharged would recommend resuming her home Coumadin regimen and rechecking her PT/INR on Thursday. 2. See physician sticky notes for electrolyte recommendations. 3. Follow-up AM INR and labs   Estella Husk, Pharm.D., BCPS Clinical Pharmacist  Pager [phone] 09/06/2011, 8:59 AM

## 2011-09-06 NOTE — Discharge Summary (Signed)
[facility] Hospital Discharge Summary  Date of Admission: 08/31/2011 11:58 AM Admitter: @ADMITPROV @   Date of Discharge3/04/2012 Attending Physician: Rhetta Mura, MD  To do-patient needs a repeat basic metabolic panel, magnesium, phosphorus, INR in 3-5 days Patient to follow-up with new primary care physician Dr. Modesto Charon and have asked her to make appt.  Alexandria [name] RUE:[id number] DOB: 1929/09/08 DOA: 08/31/2011  PCP: [name], MD, MD  Pulmonologist-alva  Cardiologist-Hochrein   Brief narrative:  78 year old Caucasian female admitted 3/5 with atrial fibrillation and RVR, near syncope-was initially admitted to the step down unit and cardiology started to person. Electrolytes were maximized-magnesium and potassium were repleted aggressively. She reverted to sinus rhythm and her medications were adjusted by cardiology-notably her dosage of diltiazem was dropped and she was started on Tikosyn and this was titrated to a current discharge dose. She was monitored closely on telemetry unit and had daily EKGs while she was here and the QTC never prolonged.  She remained hemodynamically stable and cardiology felt she was appropriate for discharge.  Past medical history:  CAD s/p NSTEMI+DES 08/13/11, hypertension, diabetes mellitus, hyperlipidemia, atrial fibrillation, ??interstitial lung disease with PFTs [TLC 61%, DLCO 40%, FVC 52%] discontinued off oxygen by primary pulmonologist, carotid stenosis 6/12 1-39% bilateral ICA stenosis, glaucoma, skin cancer, B12 deficiency, EF 65% per cardiac cath 07/2011   Consultants:  Cardiology Procedures:  Chest x-ray 3/5 = stable cardiomegaly with probable mild chronic interstitial change but no active process  CT scan 3/5 = no acute abnormalities. Atrophy and chronic small vessel ischemic changes  UC multiple bacteria +, >100,000 Chest x-ray 3/10 = no radiographic evidence of acute cardiopulmonary disease, atherosclerosis, calcified granuloma left  lower lobe Antibiotics:  None   Hospital Course by problem list: Assessment/Plan:  1. Atrial Fibrillation with rapid ventricular response-patient's Tikosyn was adjusted by cardiology and she was kept on this. She was also kept on Cardizem 240 mg as well as digoxin 0.125. Patient has an appointment to follow up with Dr. Antoine Poche of cardiology to April 10 at 12 PM. 2. Recent NSTEMI-continue Plavix, isosorbide mononitrate 30 mg. was slightly bradycardic-she suffered no symptoms of light headedness-she is on Plavix 2/2 to NSTEMI and this needs to be weighed carefully given she is on Coumadin as well-risk of GI bleed 3. Diabetes mellitus-blood sugars range moderately controlled in hospital-in the high 200 range Continue 70/30 insulin 34 units, resistant sliding scale coverage given in the hospital-will need to be on metformin 1000 mg twice daily and also on Amaryl 4 mg daily 4. Hyperlipidemia-continue WelChol 1250 mg 5. Electrolytes-continue potassium as K. Dur 40 mg equivalents daily, magnesium and 500 twice a day-THis will need to be closely followed given the fact that she is on antiarrhythmic that has pro arrhythmic properties 6. ? UTI-urine culture negative. No role for antibiotic 7. CHF, diastolic with EF 14% by cardiac cath [chronic]-this was stable during hospitalization patient was kept on regular dose of Lasix 8. History of skin cancer 9. Calcified granuloma left lower lobe-outpatient reevaluation with imaging in 3-6 month 10. Weight loss-TSH 2.59. Outpatient reevaluation    Procedures Performed and pertinent labs: Dg Chest 2 View  09/05/2011  *RADIOLOGY REPORT*  Clinical Data: Chest pain and weakness.  Shortness of breath after stent placement 2 weeks ago.  CHEST - 2 VIEW  Comparison: Chest x-ray 08/31/2011.  Findings: Again noted is a calcified granuloma in the left lower lobe.  Slight prominence of interstitial markings in the lung bases bilaterally, similar to numerous prior studies,  favored to reflect areas of chronic scarring.  No consolidative airspace disease.  No definite pleural effusions.  Pulmonary vasculature is normal. Heart size is upper limits of normal.  Mediastinal contours are unremarkable.  Atherosclerotic calcifications within the thoracic aorta.  IMPRESSION: 1.  No radiographic evidence of acute cardiopulmonary disease. 2.  Atherosclerosis. 3.  Calcified granuloma in left lower lobe.  Original Report Authenticated By: Florencia Reasons, M.D.   Dg Chest 2 View  08/31/2011  *RADIOLOGY REPORT*  Clinical Data: Shortness of breath, chest pain, weakness, fatigue  CHEST - 2 VIEW  Comparison: Portable chest x-ray of 08/13/2011 and chest x-ray of 05/20/2011  Findings: No active infiltrate or effusion is seen.  Mild chronic interstitial change remains.  A calcified granuloma is again noted in the left lower lobe.  Mild cardiomegaly is stable.  The bones are osteopenic.  There is a partially compressed mid thoracic vertebral body which appears old.  IMPRESSION: Stable cardiomegaly and probable mild chronic interstitial change. No active process.  Original Report Authenticated By: Juline Patch, M.D.   Ct Head Wo Contrast  08/31/2011  *RADIOLOGY REPORT*  Clinical Data: Headache and dizziness.  CT HEAD WITHOUT CONTRAST  Technique:  Contiguous axial images were obtained from the base of the skull through the vertex without contrast.  Comparison: None.  Findings: There is no acute intracranial hemorrhage, infarction, or mass.  The patient has very extensive periventricular white matter lucency consistent with chronic small vessel ischemic disease. There is also diffuse atrophy with secondary mild ventricular dilatation.  No acute osseous abnormality.  IMPRESSION: No acute abnormalities.  Atrophy and chronic small vessel ischemic changes as described.  Original Report Authenticated By: Gwynn Burly, M.D.   Dg Chest Port 1 View  08/13/2011  *RADIOLOGY REPORT*  Clinical Data:  Shortness of breath, pain  PORTABLE CHEST - 1 VIEW  Comparison: Chest x-ray of 05/20/2011  Findings: No active infiltrate or effusion is seen.  There is cardiomegaly present which is stable.  No acute bony abnormality is noted.  IMPRESSION: Stable cardiomegaly.  No active lung disease.  Original Report Authenticated By: Juline Patch, M.D.    Discharge Vitals & PE:  BP 157/49  Pulse 69  Temp(Src) 98.6 F (37 C) (Oral)  Resp 18  Ht 5\' 4"  (1.626 m)  Wt 71 kg (156 lb 8.4 oz)  BMI 26.87 kg/m2  SpO2 97% Doing well today, no apparent distress, no shortness of breath no chest pain no nausea no vomiting  Alert oriented. Chest clinically clear no added sounds S1-S2 no murmur or gallop telemetry-bradycardia with no regular abdomen soft nontender nondistended  lower extremities have no edema or swelling  Discharge Labs:  Results for orders placed during the hospital encounter of 08/31/11 (from the past 24 hour(s))  GLUCOSE, CAPILLARY     Status: Abnormal   Collection Time   09/05/11 11:27 AM      Component Value Range   Glucose-Capillary 109 (*) 70 - 99 (mg/dL)   Comment 1 Notify RN    MAGNESIUM     Status: Normal   Collection Time   09/05/11 12:05 PM      Component Value Range   Magnesium 1.6  1.5 - 2.5 (mg/dL)  GLUCOSE, CAPILLARY     Status: Abnormal   Collection Time   09/05/11  4:48 PM      Component Value Range   Glucose-Capillary 203 (*) 70 - 99 (mg/dL)   Comment 1 Notify RN    GLUCOSE,  CAPILLARY     Status: Abnormal   Collection Time   09/05/11  9:02 PM      Component Value Range   Glucose-Capillary 190 (*) 70 - 99 (mg/dL)  PROTIME-INR     Status: Abnormal   Collection Time   09/06/11  6:05 AM      Component Value Range   Prothrombin Time 26.3 (*) 11.6 - 15.2 (seconds)   INR 2.37 (*) 0.00 - 1.49   BASIC METABOLIC PANEL     Status: Abnormal   Collection Time   09/06/11  6:05 AM      Component Value Range   Sodium 133 (*) 135 - 145 (mEq/L)   Potassium 4.8  3.5 - 5.1 (mEq/L)     Chloride 99  96 - 112 (mEq/L)   CO2 24  19 - 32 (mEq/L)   Glucose, Bld 230 (*) 70 - 99 (mg/dL)   BUN 14  6 - 23 (mg/dL)   Creatinine, Ser 1.61  0.50 - 1.10 (mg/dL)   Calcium 09.6 (*) 8.4 - 10.5 (mg/dL)   GFR calc non Af Amer 82 (*) >90 (mL/min)   GFR calc Af Amer >90  >90 (mL/min)  MAGNESIUM     Status: Normal   Collection Time   09/06/11  6:05 AM      Component Value Range   Magnesium 1.5  1.5 - 2.5 (mg/dL)  GLUCOSE, CAPILLARY     Status: Abnormal   Collection Time   09/06/11  8:23 AM      Component Value Range   Glucose-Capillary 246 (*) 70 - 99 (mg/dL)   Comment 1 Notify RN      Disposition and follow-up:   Ms.Alexandria [name] was discharged from in good condition.    Follow-up Appointments:  Follow-up with Family Doctor, Lab work Needed BMEt and MAg/Phos, Return to Clinic on ASAP 3-5 days and Other (Referral/Appointments) followup with Dr. Antoine Poche April 10th at 12:00Pm    Discharge Medications: Medication List  As of 09/06/2011  9:51 AM   STOP taking these medications         diltiazem 420 MG 24 hr capsule         TAKE these medications         BESIVANCE 0.6 % Susp   Generic drug: Besifloxacin HCl   Place 1 drop into both eyes 4 (four) times daily as needed. For 2 days following eye injections      clopidogrel 75 MG tablet   Commonly known as: PLAVIX   Take 1 tablet (75 mg total) by mouth stat.      colesevelam 625 MG tablet   Commonly known as: WELCHOL   Take 1,250 mg by mouth daily.      digoxin 0.125 MG tablet   Commonly known as: LANOXIN   Take 125 mcg by mouth daily.      diltiazem 240 MG 24 hr capsule   Commonly known as: CARDIZEM CD   Take 1 capsule (240 mg total) by mouth daily.      dofetilide 125 MCG capsule   Commonly known as: TIKOSYN   Take 1 capsule (125 mcg total) by mouth every 12 (twelve) hours.      fish oil-omega-3 fatty acids 1000 MG capsule   Take 1 g by mouth daily.      furosemide 40 MG tablet   Commonly known as: LASIX    Take 1 tablet (40 mg total) by mouth 2 (two) times daily.  glimepiride 4 MG tablet   Commonly known as: AMARYL   Take 1 tablet by mouth Twice daily.      isosorbide mononitrate 30 MG 24 hr tablet   Commonly known as: IMDUR   Take 30 mg by mouth daily.      latanoprost 0.005 % ophthalmic solution   Commonly known as: XALATAN   Place 1 drop into both eyes at bedtime.      magnesium gluconate 500 MG tablet   Commonly known as: MAGONATE   Take 1 tablet (500 mg total) by mouth 2 (two) times daily.      metFORMIN 1000 MG tablet   Commonly known as: GLUCOPHAGE   Take 1 tablet (1,000 mg total) by mouth 2 (two) times daily with a meal.      metoprolol 50 MG tablet   Commonly known as: LOPRESSOR   Take 50 mg by mouth 2 (two) times daily.      nitroGLYCERIN 0.4 MG SL tablet   Commonly known as: NITROSTAT   Place 0.4 mg under the tongue every 5 (five) minutes as needed. As needed for chest pain      NOVOLOG MIX 70/30 FLEXPEN (70-30) 100 UNIT/ML injection   Generic drug: insulin aspart protamine-insulin aspart   Inject 20-34 Units into the skin Twice daily. 34 units in the morning & 20 units in the evening      potassium chloride 10 MEQ tablet   Commonly known as: K-DUR,KLOR-CON   Take 10 mEq by mouth daily.      potassium chloride SA 20 MEQ tablet   Commonly known as: K-DUR,KLOR-CON   Take 2 tablets (40 mEq total) by mouth 2 (two) times daily.      Vitamin D 1000 UNITS capsule   Take 1,000 Units by mouth daily. OTC        warfarin 5 MG tablet   Commonly known as: COUMADIN   Take 5-10 mg by mouth daily. Alternates days and takes 10 mg on Tuesdays, Thursdays & Saturday.  & 5 mg other days of the week.           Medications Discontinued During This Encounter  Medication Reason  . KLOR-CON M10 10 MEQ tablet Duplicate  . aspirin 81 MG tablet Change in therapy  . diltiazem (CARDIZEM) injection 15 mg   . fish oil-omega-3 fatty acids capsule 1 g Formulary change  .  Besifloxacin HCl 0.6 % SUSP 1 drop Completed Course  . warfarin (COUMADIN) tablet 5-10 mg Dose change  . metoprolol (LOPRESSOR) injection 5 mg   . insulin aspart protamine-insulin aspart (NOVOLOG 70/30) injection 20-34 Units   . diltiazem (CARDIZEM) 100 mg in dextrose 5 % 100 mL infusion   . dofetilide (TIKOSYN) capsule 250 mcg Duplicate  . sodium chloride 0.9 % injection 3 mL   . sodium chloride 0.9 % injection 3 mL   . 0.9 %  sodium chloride infusion   . sodium chloride 0.9 % injection 3 mL   . insulin aspart (novoLOG) injection 0-15 Units   . sodium chloride 0.9 % injection 3 mL   . 0.9 %  sodium chloride infusion   . sodium chloride 0.9 % injection 3 mL   . diltiazem (CARDIZEM CD) 24 hr capsule 240 mg   . warfarin (COUMADIN) tablet 10 mg Dose change  . potassium chloride (K-DUR,KLOR-CON) CR tablet 10 mEq   . insulin aspart (novoLOG) injection 4 Units   . furosemide (LASIX) tablet 80 mg   . dofetilide (TIKOSYN)  capsule 250 mcg   . furosemide (LASIX) tablet 40 mg   . diltiazem (CARDIZEM CD) 24 hr capsule 360 mg   . potassium chloride SA (K-DUR,KLOR-CON) CR tablet 40 mEq   . insulin aspart protamine-insulin aspart (NOVOLOG 70/30) injection 20 Units Formulary change  . insulin aspart protamine-insulin aspart (NOVOLOG 70/30) injection 34 Units Formulary change  . diltiazem (TIAZAC) 420 MG 24 hr capsule Stop Taking at Discharge  . furosemide (LASIX) 40 MG tablet Stop Taking at Discharge    Signed: [facility] 09/06/2011, 9:51 AM

## 2011-09-08 ENCOUNTER — Ambulatory Visit: Payer: Medicare Other | Admitting: Cardiology

## 2011-09-12 ENCOUNTER — Inpatient Hospital Stay (HOSPITAL_COMMUNITY)
Admission: EM | Admit: 2011-09-12 | Discharge: 2011-09-19 | DRG: 309 | Disposition: A | Discharge status: Home / Self Care | Payer: Medicare Other | Source: Ambulatory Visit | Attending: Cardiology | Admitting: Cardiology

## 2011-09-12 ENCOUNTER — Encounter (HOSPITAL_COMMUNITY): Payer: Self-pay | Admitting: Emergency Medicine

## 2011-09-12 ENCOUNTER — Other Ambulatory Visit: Payer: Self-pay

## 2011-09-12 ENCOUNTER — Emergency Department (HOSPITAL_COMMUNITY): Payer: Medicare Other

## 2011-09-12 DIAGNOSIS — I10 Essential (primary) hypertension: Secondary | ICD-10-CM | POA: Diagnosis present

## 2011-09-12 DIAGNOSIS — I5033 Acute on chronic diastolic (congestive) heart failure: Secondary | ICD-10-CM

## 2011-09-12 DIAGNOSIS — I480 Paroxysmal atrial fibrillation: Secondary | ICD-10-CM

## 2011-09-12 DIAGNOSIS — I2510 Atherosclerotic heart disease of native coronary artery without angina pectoris: Secondary | ICD-10-CM | POA: Diagnosis present

## 2011-09-12 DIAGNOSIS — I4891 Unspecified atrial fibrillation: Secondary | ICD-10-CM

## 2011-09-12 DIAGNOSIS — R531 Weakness: Secondary | ICD-10-CM

## 2011-09-12 DIAGNOSIS — I252 Old myocardial infarction: Secondary | ICD-10-CM

## 2011-09-12 DIAGNOSIS — Z833 Family history of diabetes mellitus: Secondary | ICD-10-CM

## 2011-09-12 DIAGNOSIS — R002 Palpitations: Secondary | ICD-10-CM

## 2011-09-12 DIAGNOSIS — Z7901 Long term (current) use of anticoagulants: Secondary | ICD-10-CM

## 2011-09-12 DIAGNOSIS — E785 Hyperlipidemia, unspecified: Secondary | ICD-10-CM | POA: Diagnosis present

## 2011-09-12 DIAGNOSIS — Z794 Long term (current) use of insulin: Secondary | ICD-10-CM

## 2011-09-12 DIAGNOSIS — E119 Type 2 diabetes mellitus without complications: Secondary | ICD-10-CM | POA: Diagnosis present

## 2011-09-12 DIAGNOSIS — R079 Chest pain, unspecified: Secondary | ICD-10-CM

## 2011-09-12 DIAGNOSIS — J8410 Pulmonary fibrosis, unspecified: Secondary | ICD-10-CM | POA: Diagnosis present

## 2011-09-12 DIAGNOSIS — Z7902 Long term (current) use of antithrombotics/antiplatelets: Secondary | ICD-10-CM

## 2011-09-12 DIAGNOSIS — Z8249 Family history of ischemic heart disease and other diseases of the circulatory system: Secondary | ICD-10-CM

## 2011-09-12 DIAGNOSIS — I5032 Chronic diastolic (congestive) heart failure: Secondary | ICD-10-CM | POA: Diagnosis present

## 2011-09-12 LAB — URINE MICROSCOPIC-ADD ON

## 2011-09-12 LAB — GLUCOSE, CAPILLARY: Glucose-Capillary: 233 mg/dL — ABNORMAL HIGH (ref 70–99)

## 2011-09-12 LAB — MRSA PCR SCREENING: MRSA by PCR: NEGATIVE

## 2011-09-12 LAB — POCT I-STAT, CHEM 8
Chloride: 99 mEq/L (ref 96–112)
Creatinine, Ser: 0.7 mg/dL (ref 0.50–1.10)
Glucose, Bld: 291 mg/dL — ABNORMAL HIGH (ref 70–99)
HCT: 40 % (ref 36.0–46.0)
Potassium: 3.9 mEq/L (ref 3.5–5.1)

## 2011-09-12 LAB — DIFFERENTIAL
Basophils Relative: 0 % (ref 0–1)
Eosinophils Absolute: 0.1 10*3/uL (ref 0.0–0.7)
Monocytes Absolute: 0.9 10*3/uL (ref 0.1–1.0)
Monocytes Relative: 8 % (ref 3–12)
Neutrophils Relative %: 83 % — ABNORMAL HIGH (ref 43–77)

## 2011-09-12 LAB — CBC
HCT: 38.6 % (ref 36.0–46.0)
Hemoglobin: 13.4 g/dL (ref 12.0–15.0)
MCH: 28.7 pg (ref 26.0–34.0)
MCHC: 34.7 g/dL (ref 30.0–36.0)

## 2011-09-12 LAB — URINALYSIS, ROUTINE W REFLEX MICROSCOPIC
Bilirubin Urine: NEGATIVE
Hgb urine dipstick: NEGATIVE
Protein, ur: 30 mg/dL — AB
Urobilinogen, UA: 1 mg/dL (ref 0.0–1.0)

## 2011-09-12 MED ORDER — CLOPIDOGREL BISULFATE 75 MG PO TABS
75.0000 mg | ORAL_TABLET | Freq: Every day | ORAL | Status: DC
  Administered 2011-09-12 – 2011-09-19 (×8): 75 mg via ORAL
  Filled 2011-09-12 (×8): qty 1

## 2011-09-12 MED ORDER — OMEGA-3-ACID ETHYL ESTERS 1 G PO CAPS
1.0000 g | ORAL_CAPSULE | Freq: Every day | ORAL | Status: DC
  Administered 2011-09-12 – 2011-09-19 (×8): 1 g via ORAL
  Filled 2011-09-12 (×8): qty 1

## 2011-09-12 MED ORDER — NITROGLYCERIN 0.4 MG SL SUBL: 0.4000 mg | SUBLINGUAL_TABLET | SUBLINGUAL | Status: DC | PRN

## 2011-09-12 MED ORDER — METFORMIN HCL 500 MG PO TABS
1000.0000 mg | ORAL_TABLET | Freq: Two times a day (BID) | ORAL | Status: DC
  Administered 2011-09-13 – 2011-09-19 (×13): 1000 mg via ORAL
  Filled 2011-09-12 (×9): qty 2
  Filled 2011-09-12: qty 1
  Filled 2011-09-12 (×6): qty 2

## 2011-09-12 MED ORDER — ONDANSETRON HCL 4 MG/2ML IJ SOLN: 4.0000 mg | Freq: Four times a day (QID) | INTRAMUSCULAR | Status: DC | PRN

## 2011-09-12 MED ORDER — DIGOXIN 125 MCG PO TABS
125.0000 ug | ORAL_TABLET | Freq: Every day | ORAL | Status: DC
  Administered 2011-09-12 – 2011-09-19 (×8): 125 ug via ORAL
  Filled 2011-09-12 (×8): qty 1

## 2011-09-12 MED ORDER — ASPIRIN EC 81 MG PO TBEC
81.0000 mg | DELAYED_RELEASE_TABLET | Freq: Every day | ORAL | Status: DC
  Administered 2011-09-13: 81 mg via ORAL
  Filled 2011-09-12 (×2): qty 1

## 2011-09-12 MED ORDER — WARFARIN - PHARMACIST DOSING INPATIENT
Freq: Every day | Status: DC
  Administered 2011-09-15 – 2011-09-17 (×3)

## 2011-09-12 MED ORDER — MAGNESIUM GLUCONATE 500 MG PO TABS
500.0000 mg | ORAL_TABLET | Freq: Two times a day (BID) | ORAL | Status: DC
  Filled 2011-09-12: qty 1

## 2011-09-12 MED ORDER — SODIUM CHLORIDE 0.9 % IV SOLN
INTRAVENOUS | Status: DC
  Administered 2011-09-12: 15:00:00 via INTRAVENOUS
  Administered 2011-09-13 – 2011-09-14 (×3): 1000 mL via INTRAVENOUS
  Administered 2011-09-14: 04:00:00 via INTRAVENOUS

## 2011-09-12 MED ORDER — METOPROLOL TARTRATE 1 MG/ML IV SOLN: 5.0000 mg | INTRAVENOUS | Status: AC

## 2011-09-12 MED ORDER — INSULIN ASPART PROT & ASPART (70-30 MIX) 100 UNIT/ML ~~LOC~~ SUSP
15.0000 [IU] | Freq: Two times a day (BID) | SUBCUTANEOUS | Status: DC
  Administered 2011-09-13 – 2011-09-19 (×13): 15 [IU] via SUBCUTANEOUS
  Filled 2011-09-12 (×2): qty 3

## 2011-09-12 MED ORDER — WARFARIN SODIUM 5 MG PO TABS: 5.0000 mg | ORAL_TABLET | Freq: Every day | ORAL | Status: DC

## 2011-09-12 MED ORDER — INSULIN ASPART 100 UNIT/ML ~~LOC~~ SOLN
0.0000 [IU] | SUBCUTANEOUS | Status: DC
  Administered 2011-09-13 (×2): 2 [IU] via SUBCUTANEOUS
  Administered 2011-09-13: 7 [IU] via SUBCUTANEOUS
  Administered 2011-09-13: 2 [IU] via SUBCUTANEOUS

## 2011-09-12 MED ORDER — ISOSORBIDE MONONITRATE ER 30 MG PO TB24
30.0000 mg | ORAL_TABLET | Freq: Every day | ORAL | Status: DC
  Administered 2011-09-12 – 2011-09-19 (×8): 30 mg via ORAL
  Filled 2011-09-12 (×8): qty 1

## 2011-09-12 MED ORDER — DOFETILIDE 125 MCG PO CAPS
125.0000 ug | ORAL_CAPSULE | Freq: Two times a day (BID) | ORAL | Status: DC
  Administered 2011-09-12 – 2011-09-14 (×4): 125 ug via ORAL
  Filled 2011-09-12 (×5): qty 1

## 2011-09-12 MED ORDER — METOPROLOL TARTRATE 50 MG PO TABS
50.0000 mg | ORAL_TABLET | Freq: Four times a day (QID) | ORAL | Status: DC
  Administered 2011-09-12 – 2011-09-16 (×16): 50 mg via ORAL
  Filled 2011-09-12 (×17): qty 1

## 2011-09-12 MED ORDER — FUROSEMIDE 40 MG PO TABS
40.0000 mg | ORAL_TABLET | Freq: Two times a day (BID) | ORAL | Status: DC
  Administered 2011-09-13 – 2011-09-17 (×9): 40 mg via ORAL
  Filled 2011-09-12 (×11): qty 1

## 2011-09-12 MED ORDER — OMEGA-3 FATTY ACIDS 1000 MG PO CAPS: 1.0000 g | ORAL_CAPSULE | Freq: Every day | ORAL | Status: DC

## 2011-09-12 MED ORDER — ASPIRIN 81 MG PO CHEW
324.0000 mg | CHEWABLE_TABLET | ORAL | Status: AC
  Administered 2011-09-12: 324 mg via ORAL
  Filled 2011-09-12: qty 1
  Filled 2011-09-12: qty 3

## 2011-09-12 MED ORDER — WARFARIN SODIUM 5 MG PO TABS
5.0000 mg | ORAL_TABLET | Freq: Once | ORAL | Status: AC
  Administered 2011-09-12: 5 mg via ORAL
  Filled 2011-09-12: qty 1

## 2011-09-12 MED ORDER — MAGNESIUM OXIDE 400 MG PO TABS
200.0000 mg | ORAL_TABLET | Freq: Every day | ORAL | Status: DC
  Administered 2011-09-12 – 2011-09-15 (×4): 200 mg via ORAL
  Filled 2011-09-12 (×5): qty 0.5

## 2011-09-12 MED ORDER — ACETAMINOPHEN 325 MG PO TABS: 650.0000 mg | ORAL_TABLET | ORAL | Status: DC | PRN

## 2011-09-12 MED ORDER — LATANOPROST 0.005 % OP SOLN
1.0000 [drp] | Freq: Every day | OPHTHALMIC | Status: DC
  Administered 2011-09-12 – 2011-09-18 (×7): 1 [drp] via OPHTHALMIC
  Filled 2011-09-12: qty 2.5

## 2011-09-12 MED ORDER — ASPIRIN 300 MG RE SUPP
300.0000 mg | RECTAL | Status: AC
  Filled 2011-09-12: qty 1

## 2011-09-12 MED ORDER — DILTIAZEM HCL ER COATED BEADS 240 MG PO CP24
240.0000 mg | ORAL_CAPSULE | Freq: Every day | ORAL | Status: DC
  Administered 2011-09-12 – 2011-09-14 (×3): 240 mg via ORAL
  Filled 2011-09-12 (×3): qty 1

## 2011-09-12 MED ORDER — POTASSIUM CHLORIDE CRYS ER 20 MEQ PO TBCR
40.0000 meq | EXTENDED_RELEASE_TABLET | Freq: Two times a day (BID) | ORAL | Status: DC
  Administered 2011-09-12 – 2011-09-19 (×14): 40 meq via ORAL
  Filled 2011-09-12 (×15): qty 2

## 2011-09-12 NOTE — ED Provider Notes (Signed)
History     CSN: [id number]  Arrival date & time 09/12/11  1407   First MD Initiated Contact with Patient 09/12/11 1425      Chief Complaint  Patient presents with  . Atrial Fibrillation    (Consider location/radiation/quality/duration/timing/severity/associated sxs/prior treatment) Patient is a 78 y.o. female presenting with atrial fibrillation. The history is provided by the patient.  Atrial Fibrillation Associated symptoms include chest pain. Pertinent negatives include no abdominal pain, no headaches and no shortness of breath.   patient was at home and had an episode of near syncope. She states she felt like she is going to pass out. She was recently in the hospital for the same and was in atrial fibrillation with RVR. She felt that after a previous recent MI. She has a mild chest pain now. The lightheadedness improved. She states she does still feel weak all over all. No dysuria. No abdominal pain. No headache. No injury from the fall. She states she lower self to the ground.  Past Medical History  Diagnosis Date  . Hypertension     with h/o hypertensive urgency  . Glaucoma   . Tremor, essential   . Diabetes mellitus with neuropathy   . B12 deficiency   . Hyperlipidemia   . Atrial fibrillation     a. on chronic coumadin  . Chronic diastolic heart failure     echo 05/2009: mild LVH, EF 55-60%, grade 2 diast dysfxn, mild LAE, PASP  . CAD (coronary artery disease)     a.  LHC 06/2009: pLAD 40%, prox-mid D1 80% (small);  CFX 40-50%, mRCA 20%, EF 65%  -  med Rx;  b.  05/23/11 Myoview - EF 53%, no isch/inf;  c. 07/2011 NSTEMI - PCI/DES LCX - Resolute stent  . Carotid stenosis     dopplers 6/12: 1-39% bilat ICA  . Pulmonary fibrosis     Dr. Vassie Loll;  patient taken off O2 in 8/12; dyspnea felt CHF>>ILD  . Skin cancer   . Shortness of breath   . Atrial fibrillation   . A-fib 08/31/2011  . Warfarin anticoagulation     Past Surgical History  Procedure Date  . Appendectomy   .  Nose surgery   . Cardiac catheterization     Family History  Problem Relation Age of Onset  . Throat cancer Brother   . Prostate cancer Father   . Diabetes Mother   . Diabetes Brother     multiple  . Heart disease Brother     History  Substance Use Topics  . Smoking status: Never Smoker   . Smokeless tobacco: Never Used  . Alcohol Use: No    OB History    Grav Para Term Preterm Abortions TAB SAB Ect Mult Living                  Review of Systems  Constitutional: Negative for activity change and appetite change.  HENT: Negative for neck stiffness.   Eyes: Negative for pain.  Respiratory: Negative for chest tightness and shortness of breath.   Cardiovascular: Positive for chest pain and palpitations. Negative for leg swelling.  Gastrointestinal: Negative for nausea, vomiting, abdominal pain and diarrhea.  Genitourinary: Negative for flank pain.  Musculoskeletal: Negative for back pain.  Skin: Negative for rash.  Neurological: Positive for light-headedness. Negative for weakness, numbness and headaches.  Psychiatric/Behavioral: Negative for behavioral problems.    Allergies  Ace inhibitors; Crestor; Lipitor; Niaspan; Olmesartan; and Prednisone  Home Medications   Current Outpatient  Rx  Name Route Sig Dispense Refill  . BESIFLOXACIN HCL 0.6 % OP SUSP Both Eyes Place 1 drop into both eyes 4 (four) times daily as needed. For 2 days following eye injections    . VITAMIN D 1000 UNITS PO CAPS Oral Take 1,000 Units by mouth daily. OTC      . CLOPIDOGREL BISULFATE 75 MG PO TABS Oral Take 1 tablet (75 mg total) by mouth stat. 30 tablet 6  . COLESEVELAM HCL 625 MG PO TABS Oral Take 1,250 mg by mouth daily.      Marland Kitchen DIGOXIN 0.125 MG PO TABS Oral Take 125 mcg by mouth daily.    Marland Kitchen DILTIAZEM HCL ER COATED BEADS 240 MG PO CP24 Oral Take 1 capsule (240 mg total) by mouth daily. 30 capsule 2  . DOFETILIDE 125 MCG PO CAPS Oral Take 1 capsule (125 mcg total) by mouth every 12 (twelve)  hours. 30 capsule 2  . OMEGA-3 FATTY ACIDS 1000 MG PO CAPS Oral Take 1 g by mouth daily.    . FUROSEMIDE 40 MG PO TABS Oral Take 1 tablet (40 mg total) by mouth 2 (two) times daily. 20 tablet 0  . GLIMEPIRIDE 4 MG PO TABS Oral Take 1 tablet by mouth Twice daily.    . ISOSORBIDE MONONITRATE ER 30 MG PO TB24 Oral Take 30 mg by mouth daily.    Marland Kitchen LATANOPROST 0.005 % OP SOLN Both Eyes Place 1 drop into both eyes at bedtime.     Marland Kitchen MAGNESIUM GLUCONATE 500 MG PO TABS Oral Take 1 tablet (500 mg total) by mouth 2 (two) times daily. 60 tablet 2  . METFORMIN HCL 1000 MG PO TABS Oral Take 1 tablet (1,000 mg total) by mouth 2 (two) times daily with a meal.      Resume on 08/18/2011  . METOPROLOL TARTRATE 50 MG PO TABS Oral Take 50 mg by mouth 2 (two) times daily.    Marland Kitchen NITROGLYCERIN 0.4 MG SL SUBL Sublingual Place 0.4 mg under the tongue every 5 (five) minutes as needed. As needed for chest pain    . NOVOLOG MIX 70/30 FLEXPEN (70-30) 100 UNIT/ML [place] SUSP Subcutaneous Inject 20-34 Units into the skin Twice daily. 34 units in the morning & 20 units in the evening    . POTASSIUM CHLORIDE CRYS ER 10 MEQ PO TBCR Oral Take 10 mEq by mouth daily.    Marland Kitchen POTASSIUM CHLORIDE CRYS ER 20 MEQ PO TBCR Oral Take 2 tablets (40 mEq total) by mouth 2 (two) times daily. 120 tablet 2  . WARFARIN SODIUM 5 MG PO TABS Oral Take 5-10 mg by mouth daily. Alternates days and takes 10 mg on Tuesdays, Thursdays & Saturday.  & 5 mg other days of the week.      BP 144/55  Pulse 96  Temp(Src) 97.8 F (36.6 C) (Oral)  Resp 23  SpO2 100%  Physical Exam  Nursing note and vitals reviewed. Constitutional: She is oriented to person, place, and time. She appears well-developed and well-nourished.  HENT:  Head: Normocephalic and atraumatic.  Eyes: EOM are normal. Pupils are equal, round, and reactive to light.  Neck: Normal range of motion. Neck supple.  Cardiovascular: Normal rate and normal heart sounds.   No murmur heard.      Irregular  rhythm  Pulmonary/Chest: Effort normal and breath sounds normal. No respiratory distress. She has no wheezes. She has no rales.  Abdominal: Soft. Bowel sounds are normal. She exhibits no distension. There is  no tenderness. There is no rebound and no guarding.  Musculoskeletal: Normal range of motion.  Neurological: She is alert and oriented to person, place, and time. No cranial nerve deficit.  Skin: Skin is warm and dry.  Psychiatric: She has a normal mood and affect. Her speech is normal.    ED Course  Procedures (including critical care time)   Labs Reviewed  CBC  DIFFERENTIAL  URINALYSIS, ROUTINE W REFLEX MICROSCOPIC  DIGOXIN LEVEL  PROTIME-INR   No results found.   No diagnosis found.   Date: 09/12/2011  Rate: 89  Rhythm: normal sinus rhythm and premature atrial contractions (PAC)  QRS Axis: normal  Intervals: normal  ST/T Wave abnormalities: nonspecific ST/T changes  Conduction Disutrbances:none  Narrative Interpretation:PACs are new,  LVH  Old EKG Reviewed: changes noted    MDM  Near syncope. Recent history of A. fib with RVR. He is not currently in A. fib, but she may have had an event of it. She is hyperglycemic here. Her EKG is sinus with frequent PACs. She'll likely be admitted to medicine with cardiology consult.        Juliet Rude. Rubin Payor, MD 09/13/11 [phone]

## 2011-09-12 NOTE — ED Provider Notes (Addendum)
6:16 PM Lab workup is reassuringly negative, with normal TNI, EkG showing atrial bigeminy.  Glucose elevated at 291. INR therapeutic.  Pt does not feel safe to go home.  Call to [facility] to admit her.  6:57 PM Case discussed with Dr. Gwenlyn Perking, who does not feel she has criteria for admission.  He recommends calling [facility] to get their opinion.   7:05 PM [facility] will see pt.   8:18 PM Pt seen by Dr. Margo Aye.  Admit to Stepdown bed for evaluation of paroxysmal atrial fibrillation.  8:38 PM  Date: 09/12/2011  Rate:106  Rhythm: atrial flutter and premature ventricular contractions (PVC)--with 2:1 Block  QRS Axis: left  Intervals: normal QRS:  Left ventricular hypertrophy.  ST/T Wave abnormalities: normal  Conduction Disutrbances:none  Narrative Interpretation: Abnormal EKG.  Old EKG Reviewed: changes noted-- was in sinus rhythm on tracing earlier today.    Carleene Cooper III, MD 09/12/11 1818  Carleene Cooper III, MD 09/12/11 2019  Carleene Cooper III, MD 09/12/11 2041

## 2011-09-12 NOTE — ED Notes (Signed)
Attempted to give report to [facility].  Will call back in 15 minutes.

## 2011-09-12 NOTE — Progress Notes (Signed)
ANTICOAGULATION CONSULT NOTE - Initial Consult  Pharmacy Consult for Coumadin Indication: atrial fibrillation  Allergies  Allergen Reactions  . Ace Inhibitors Cough    Enalapril  benicor    . Crestor (Rosuvastatin Calcium)     Fatigue and leg pain   . Lipitor (Atorvastatin Calcium)     Myalgias   . Niaspan (Niacin)     Intol   . Olmesartan Cough  . Prednisone     REACTION: "retained fluid"     Vital Signs: Temp: 98.3 F (36.8 C) (03/17 2026) Temp src: Oral (03/17 2026) BP: 133/61 mmHg (03/17 2026) Pulse Rate: 109  (03/17 2026)  Labs:  Basename 09/12/11 1553 09/12/11 1540 09/12/11 1516 09/12/11 1430  HGB -- -- 13.6 13.4  HCT -- -- 40.0 38.6  PLT -- -- -- 346  APTT -- -- -- --  LABPROT -- 24.9* -- --  INR -- 2.21* -- --  HEPARINUNFRC -- -- -- --  CREATININE -- -- 0.70 --  CKTOTAL -- -- -- --  CKMB -- -- -- --  TROPONINI <0.30 -- -- --   The CrCl is unknown because both a height and weight (above a minimum accepted value) are required for this calculation.  Medical History: Past Medical History  Diagnosis Date  . Hypertension     with h/o hypertensive urgency  . Glaucoma   . Tremor, essential   . Diabetes mellitus with neuropathy   . B12 deficiency   . Hyperlipidemia   . Atrial fibrillation     a. on chronic coumadin  . Chronic diastolic heart failure     echo 05/2009: mild LVH, EF 55-60%, grade 2 diast dysfxn, mild LAE, PASP  . CAD (coronary artery disease)     a.  LHC 06/2009: pLAD 40%, prox-mid D1 80% (small);  CFX 40-50%, mRCA 20%, EF 65%  -  med Rx;  b.  05/23/11 Myoview - EF 53%, no isch/inf;  c. 07/2011 NSTEMI - PCI/DES LCX - Resolute stent  . Carotid stenosis     dopplers 6/12: 1-39% bilat ICA  . Pulmonary fibrosis     Dr. Vassie Loll;  patient taken off O2 in 8/12; dyspnea felt CHF>>ILD  . Skin cancer   . Shortness of breath   . Atrial fibrillation   . A-fib 08/31/2011  . Warfarin anticoagulation     Medications:   (Not in a hospital  admission)  Assessment: 78 y/o female patient admitted with syncope, on chronic coumadin for h/o afib. INR therapeutic, has not taken dose today.  Goal of Therapy:  INR 2-3   Plan:  Coumadin 5mg  today and f/u daily protime.  Verlene Mayer, PharmD, BCPS Pager [phone] 09/12/2011,8:39 PM

## 2011-09-12 NOTE — H&P (Signed)
NAMECHERELL, Alexandria NO.:  [id number]  MEDICAL RECORD NO.:  [id number]  LOCATION:  2610                         FACILITY:  [facility]  PHYSICIAN:  Alexandria [name], Alexandria [name]       DATE OF BIRTH:  May 01, 1930  DATE OF ADMISSION:  09/12/2011 DATE OF DISCHARGE:                             HISTORY & PHYSICAL   PRIMARY CARDIOLOGIST:  Alexandria [name], Alexandria [name], [facility]  CHIEF COMPLAINT:  Chest pain and palpitations.  HISTORY OF PRESENT ILLNESS:  Alexandria [name] is a 78 year old white female, who was recently admitted with paroxysmal atrial fibrillation earlier this month and started on Tikosyn therapy at that point in time.  She was discharged approximately a week ago and reports feeling weak since then, which she reports as generalized weakness.  She has been having intermittent episodes where she feels palpitations and some vague chest discomfort that is substernal.  This chest discomfort is not associated with exertion and can come and go at any time.  She is unsure how long it lasts, but it sounds like it is fairly brief in duration.  She denies any associated shortness of breath, nausea, or vomiting.  This morning, she had an episode of palpitations and chest discomfort and also got light-headed and dizzy.  She did not have any frank syncope, but did fall to the floor on her own to prevent falling.  Currently, she feels generally weak and feels like her heart is racing.  She denies any headaches or blurry vision.  She has not had any numbness, tingling, or weakness.  She denies any PND or orthopnea.  She has not had any worsening shortness of breath or dyspnea on exertion.  She denies fevers, chills, or sweats.  She has not had any dysuria or hematuria. There have been no changes in her urinary or bowel habits.  Otherwise, a complete review of systems was performed and was negative except for what is stated above.  PAST MEDICAL HISTORY: 1. History of paroxysmal atrial fibrillation, on  chronic Coumadin     therapy. 2. Coronary artery disease, status post NSTEMI in February 2013.  Had     a PCI with a drug-eluting stent to her left circumflex artery. 3. Diastolic heart failure, EF 55-60%. 4. Hypertension. 5. Diabetes. 6. Dyslipidemia. 7. B12 deficiency. 8. Glaucoma. 9. History of carotid vascular disease. 10.Pulmonary fibrosis.  PAST SURGICAL HISTORY: 1. Appendectomy. 2. Nose surgery.  FAMILY HISTORY:  She has a brother with throat cancer.  Mother had diabetes and her brother had diabetes, and she also had a brother with heart disease.  SOCIAL HISTORY:  She is a nonsmoker.  No alcohol or illicit drug use.  ALLERGIES:  She is allergic to ACE INHIBITORS, OLMESARTAN, CRESTOR, LIPITOR, NIASPAN, AND PREDNISONE.  HOME MEDICATIONS: 1. Vitamin D 1000 units daily. 2. Plavix 75 mg p.o. daily. 3. WelChol 1250 mg p.o. daily. 4. Digoxin 0.125 mg p.o. daily. 5. Cardizem CD 240 mg p.o. daily. 6. Tikosyn 125 mcg p.o. b.i.d. 7. Omega-3 fish oils 1 g p.o. daily. 8. Lasix 40 mg p.o. b.i.d. 9. Glimepiride 4 mg p.o. b.i.d. 10.Imdur 30 mg p.o. daily. 11.Xalatan eye drops. 12.Magonate 500 mg p.o. b.i.d.  13.Metformin 1000 mg p.o. b.i.d. 14.Metoprolol 50 mg p.o. b.i.d. 15.Nitroglycerin p.r.n. 16.NovoLog 70/30, 20-34 units in the skin twice daily.  She is     currently taking 34 units in the morning, 20 units in the evening,     17.  Coumadin.  She takes 10 mg on Tuesdays, Thursdays, and     Saturday and 5 mg every other day.  PHYSICAL EXAMINATION:  VITAL SIGNS:  Afebrile with temperature 97.8, pulse of 124 and irregular, respiratory rate of 18, blood pressure 144/77, O2 sats 100% on room air. GENERAL:  She is an elderly white female, in no apparent distress. NEUROLOGIC:  She has a resting tremor, otherwise afocal.  Cranial nerves intact. HEENT:  Mucous membranes are moist.  Has anicteric sclerae.  Pupils equally round and reactive to light. NECK:  Shows no carotid  bruits.  Normal jugular venous pressure. LUNGS:  Clear to auscultation bilaterally. CARDIOVASCULAR:  She is tachycardic, irregular.  She has a 2/6 systolic murmur. ABDOMEN:  Soft, nontender, nondistended.  No bruits. EXTREMITIES:  Warm with no edema. SKIN:  No rashes or ulcers.  LABORATORY DATA:  Sodium 132, potassium 3.9, chloride of 99, BUN of 17, creatinine of 0.7, glucose of 291, ionized calcium 1.28.  White blood cell count was 11.3 with a left shift, neutrophils 83%, hematocrit of 39, platelet count of 346.  PT was 24.9, PTT was 2.21.  Digoxin level 0.6.  Urinalysis had trace leukocytes, many bacteria, but she had several squamous epithelial.  Chest x-ray showed no acute infiltrates or effusions.  EKG shows sinus mechanism with a ventricular rate of 89 beats per minute.  She does have PACs.  There is LVH.  She also has ST-segment suggestive of digitalis effect.  IMPRESSION/PLAN:  This is a 78 year old female with a history of paroxysmal atrial fibrillation, recently started on Tikosyn, also with a history of coronary artery disease, who presents with palpitations and chest discomfort.  Initially, in the ED, her blood pressure registered at 200 systolic, however, this was over 189 in diastolic; this might have been error in measurement.  The remaining blood pressures have been in the 130-140s range.  However, hypertensive emergency could explain some of her symptoms.  I feel her symptoms are more likely due to paroxysmal atrial tachyarrhythmias.  While I was examining her, her heart rate jumped up to the 130 to 150s.  It looked like atrial flutter with 2:1 conduction versus atrial tachycardia.  Currently, blood pressure is stable.  She has very vague chest discomfort symptoms.  We do not feel this is an acute coronary syndrome, although she did have a drug-eluting stent placed recently.  We will admit her for serial cardiac enzymes, but feel the main thing she needs right now  is rate control given her frequent tachyarrhythmias.  We will go ahead and continue her digoxin and her diltiazem CD at current doses.  We will also continue her Tikosyn for now at her current dose.  Her QTc is normal at 443.  We will increase her metoprolol to 50 mg p.o. 4 times daily, and this can be titrated to a twice a day dosing once we get her to a stable dosing schedule.  She is already anticoagulated on Coumadin and we will continue her current dose.  For her diabetes, we will place her on half of her insulin dose requirements at home and continue her metformin and place her on sliding scale insulin as well.  ______________________________ Alexandria [name], Alexandria [name]     MH/MEDQ  D:  09/12/2011  T:  09/12/2011  Job:  [id number]

## 2011-09-12 NOTE — ED Notes (Signed)
Pt to ED via EMS with c/o Afib.  States felt like she was going to pass out.  In hospital recently for same.

## 2011-09-12 NOTE — ED Notes (Signed)
Dr.Pickering to eval ecg. 

## 2011-09-13 ENCOUNTER — Encounter: Payer: Self-pay | Admitting: Cardiology

## 2011-09-13 LAB — BASIC METABOLIC PANEL
CO2: 29 mEq/L (ref 19–32)
Chloride: 103 mEq/L (ref 96–112)
GFR calc Af Amer: >90 mL/min (ref >90)
Potassium: 3.7 mEq/L (ref 3.5–5.1)
Sodium: 138 mEq/L (ref 135–145)

## 2011-09-13 LAB — CARDIAC PANEL(CRET KIN+CKTOT+MB+TROPI)
Relative Index: INVALID (ref 0.0–2.5)
Relative Index: INVALID (ref 0.0–2.5)
Relative Index: INVALID (ref 0.0–2.5)
Total CK: 23 U/L (ref 7–177)
Troponin I: <0.3 ng/mL (ref <0.30)
Troponin I: <0.3 ng/mL (ref <0.30)
Troponin I: <0.3 ng/mL (ref <0.30)

## 2011-09-13 LAB — PROTIME-INR: Prothrombin Time: 24.5 seconds — ABNORMAL HIGH (ref 11.6–15.2)

## 2011-09-13 LAB — GLUCOSE, CAPILLARY
Glucose-Capillary: 153 mg/dL — ABNORMAL HIGH (ref 70–99)
Glucose-Capillary: 177 mg/dL — ABNORMAL HIGH (ref 70–99)
Glucose-Capillary: 181 mg/dL — ABNORMAL HIGH (ref 70–99)
Glucose-Capillary: 348 mg/dL — ABNORMAL HIGH (ref 70–99)

## 2011-09-13 MED ORDER — WARFARIN SODIUM 5 MG PO TABS
5.0000 mg | ORAL_TABLET | Freq: Once | ORAL | Status: AC
  Administered 2011-09-13: 5 mg via ORAL
  Filled 2011-09-13 (×2): qty 1

## 2011-09-13 MED ORDER — INSULIN ASPART 100 UNIT/ML ~~LOC~~ SOLN: 0.0000 [IU] | Freq: Three times a day (TID) | SUBCUTANEOUS | Status: DC

## 2011-09-13 NOTE — Progress Notes (Signed)
Inpatient Diabetes Program Recommendations  AACE/ADA: New Consensus Statement on Inpatient Glycemic Control (2009)  Target Ranges:  Prepandial:   less than 140 mg/dL      Peak postprandial:   less than 180 mg/dL (1-2 hours)      Critically ill patients:  140 - 180 mg/dL     Inpatient Diabetes Program Recommendations Correction (SSI): Please change Novolog Sensitive SSI to tid ac + HS (currently ordered Q4 hours and patient on PO diet)  Note: Will follow. Ambrose Finland RN, MSN, CDE Diabetes Coordinator Inpatient Diabetes Program [phone]

## 2011-09-13 NOTE — Progress Notes (Signed)
SUBJECTIVE:  Feels better today. No pain and no weakness   PHYSICAL EXAM Filed Vitals:   09/13/11 0400 09/13/11 0440 09/13/11 0500 09/13/11 0600  BP: 129/71  119/72 122/63  Pulse: 96 98 98 98  Temp: 97.5 F (36.4 C)     TempSrc: Oral     Resp: 22 22 23 21   Height:      Weight:   68.1 kg (150 lb 2.1 oz)   SpO2: 98% 98% 96% 98%   General:  No distress Lungs:  Basilar crackles unchanged Heart:  RRR tachy Abdomen:  Positive bowel sounds, no rebound no guarding Extremities:  No edema.  LABS: Lab Results  Component Value Date   CKTOTAL 23 09/13/2011   CKMB 3.1 09/13/2011   TROPONINI <0.30 09/13/2011   Results for orders placed during the hospital encounter of 09/12/11 (from the past 24 hour(s))  CBC     Status: Abnormal   Collection Time   09/12/11  2:30 PM      Component Value Range   WBC 11.3 (*) 4.0 - 10.5 (K/uL)   RBC 4.67  3.87 - 5.11 (MIL/uL)   Hemoglobin 13.4  12.0 - 15.0 (g/dL)   HCT 16.1  09.6 - 04.5 (%)   MCV 82.7  78.0 - 100.0 (fL)   MCH 28.7  26.0 - 34.0 (pg)   MCHC 34.7  30.0 - 36.0 (g/dL)   RDW 40.9  81.1 - 91.4 (%)   Platelets 346  150 - 400 (K/uL)  DIFFERENTIAL     Status: Abnormal   Collection Time   09/12/11  2:30 PM      Component Value Range   Neutrophils Relative 83 (*) 43 - 77 (%)   Neutro Abs 9.4 (*) 1.7 - 7.7 (K/uL)   Lymphocytes Relative 9 (*) 12 - 46 (%)   Lymphs Abs 1.0  0.7 - 4.0 (K/uL)   Monocytes Relative 8  3 - 12 (%)   Monocytes Absolute 0.9  0.1 - 1.0 (K/uL)   Eosinophils Relative 1  0 - 5 (%)   Eosinophils Absolute 0.1  0.0 - 0.7 (K/uL)   Basophils Relative 0  0 - 1 (%)   Basophils Absolute 0.0  0.0 - 0.1 (K/uL)  DIGOXIN LEVEL     Status: Abnormal   Collection Time   09/12/11  2:30 PM      Component Value Range   Digoxin Level 0.6 (*) 0.8 - 2.0 (ng/mL)  URINALYSIS, ROUTINE W REFLEX MICROSCOPIC     Status: Abnormal   Collection Time   09/12/11  2:45 PM      Component Value Range   Color, Urine YELLOW  YELLOW    APPearance  CLOUDY (*) CLEAR    Specific Gravity, Urine 1.014  1.005 - 1.030    pH 7.5  5.0 - 8.0    Glucose, UA NEGATIVE  NEGATIVE (mg/dL)   Hgb urine dipstick NEGATIVE  NEGATIVE    Bilirubin Urine NEGATIVE  NEGATIVE    Ketones, ur 15 (*) NEGATIVE (mg/dL)   Protein, ur 30 (*) NEGATIVE (mg/dL)   Urobilinogen, UA 1.0  0.0 - 1.0 (mg/dL)   Nitrite NEGATIVE  NEGATIVE    Leukocytes, UA TRACE (*) NEGATIVE   URINE MICROSCOPIC-ADD ON     Status: Abnormal   Collection Time   09/12/11  2:45 PM      Component Value Range   Squamous Epithelial / LPF MANY (*) RARE    WBC, UA 3-6  <3 (WBC/hpf)  Bacteria, UA FEW (*) RARE    Casts HYALINE CASTS (*) NEGATIVE   POCT I-STAT, CHEM 8     Status: Abnormal   Collection Time   09/12/11  3:16 PM      Component Value Range   Sodium 137  135 - 145 (mEq/L)   Potassium 3.9  3.5 - 5.1 (mEq/L)   Chloride 99  96 - 112 (mEq/L)   BUN 17  6 - 23 (mg/dL)   Creatinine, Ser 9.60  0.50 - 1.10 (mg/dL)   Glucose, Bld 454 (*) 70 - 99 (mg/dL)   Calcium, Ion 0.98  1.19 - 1.32 (mmol/L)   TCO2 28  0 - 100 (mmol/L)   Hemoglobin 13.6  12.0 - 15.0 (g/dL)   HCT 14.7  82.9 - 56.2 (%)  PROTIME-INR     Status: Abnormal   Collection Time   09/12/11  3:40 PM      Component Value Range   Prothrombin Time 24.9 (*) 11.6 - 15.2 (seconds)   INR 2.21 (*) 0.00 - 1.49   TROPONIN I     Status: Normal   Collection Time   09/12/11  3:53 PM      Component Value Range   Troponin I <0.30  <0.30 (ng/mL)  MRSA PCR SCREENING     Status: Normal   Collection Time   09/12/11  9:29 PM      Component Value Range   MRSA by PCR NEGATIVE  NEGATIVE   GLUCOSE, CAPILLARY     Status: Abnormal   Collection Time   09/12/11  9:46 PM      Component Value Range   Glucose-Capillary 233 (*) 70 - 99 (mg/dL)  CARDIAC PANEL(CRET KIN+CKTOT+MB+TROPI)     Status: Normal   Collection Time   09/12/11 11:34 PM      Component Value Range   Total CK 23  7 - 177 (U/L)   CK, MB 2.7  0.3 - 4.0 (ng/mL)   Troponin I <0.30  <0.30  (ng/mL)   Relative Index RELATIVE INDEX IS INVALID  0.0 - 2.5   GLUCOSE, CAPILLARY     Status: Abnormal   Collection Time   09/13/11 12:14 AM      Component Value Range   Glucose-Capillary 348 (*) 70 - 99 (mg/dL)  PROTIME-INR     Status: Abnormal   Collection Time   09/13/11  4:11 AM      Component Value Range   Prothrombin Time 24.5 (*) 11.6 - 15.2 (seconds)   INR 2.16 (*) 0.00 - 1.49   CARDIAC PANEL(CRET KIN+CKTOT+MB+TROPI)     Status: Normal   Collection Time   09/13/11  4:11 AM      Component Value Range   Total CK 23  7 - 177 (U/L)   CK, MB 3.1  0.3 - 4.0 (ng/mL)   Troponin I <0.30  <0.30 (ng/mL)   Relative Index RELATIVE INDEX IS INVALID  0.0 - 2.5   BASIC METABOLIC PANEL     Status: Abnormal   Collection Time   09/13/11  4:11 AM      Component Value Range   Sodium 138  135 - 145 (mEq/L)   Potassium 3.7  3.5 - 5.1 (mEq/L)   Chloride 103  96 - 112 (mEq/L)   CO2 29  19 - 32 (mEq/L)   Glucose, Bld 143 (*) 70 - 99 (mg/dL)   BUN 13  6 - 23 (mg/dL)   Creatinine, Ser 1.30  0.50 - 1.10 (mg/dL)  Calcium 9.8  8.4 - 10.5 (mg/dL)   GFR calc non Af Amer 86 (*) >90 (mL/min)   GFR calc Af Amer >90  >90 (mL/min)  GLUCOSE, CAPILLARY     Status: Abnormal   Collection Time   09/13/11  5:03 AM      Component Value Range   Glucose-Capillary 177 (*) 70 - 99 (mg/dL)    Intake/Output Summary (Last 24 hours) at 09/13/11 8657 Last data filed at 09/13/11 0700  Gross per 24 hour  Intake    875 ml  Output    600 ml  Net    275 ml    ASSESSMENT AND PLAN:   1) A-fib:  Still with paroxysms of atrial arrhythmias on admission.  Continue Tikosyn.  Beta blocker increased yesterday.  I will ask EP to see.  I don't like the thought of amiodarone with her lung disease.  She didn't tolerate higher Tikosyn and she has CAD.  Current rhythm might be an atypical flutter.  I will ask whether there might not be an interventional treatment to help with these recurrent symptoms and admissions.  2)  HYPERTENSION: This could have contributed to admission complaints.  BP labile  3) CAD: No evidence of ischemia. No change in therapy. Enzymes negative   [name] 09/13/2011 8:12 AM

## 2011-09-13 NOTE — Progress Notes (Signed)
ANTICOAGULATION CONSULT NOTE - Follow up Consult  Pharmacy Consult for Coumadin Indication: atrial fibrillation  Allergies  Allergen Reactions  . Ace Inhibitors Cough    Enalapril  benicor    . Crestor (Rosuvastatin Calcium)     Fatigue and leg pain   . Lipitor (Atorvastatin Calcium)     Myalgias   . Niaspan (Niacin)     Intol   . Olmesartan Cough  . Prednisone     REACTION: "retained fluid"     Vital Signs: Temp: 97.5 F (36.4 C) (03/18 0400) Temp src: Oral (03/18 0400) BP: 122/63 mmHg (03/18 0600) Pulse Rate: 98  (03/18 0600)  Labs:  Basename 09/13/11 0411 09/12/11 2334 09/12/11 1553 09/12/11 1540 09/12/11 1516 09/12/11 1430  HGB -- -- -- -- 13.6 13.4  HCT -- -- -- -- 40.0 38.6  PLT -- -- -- -- -- 346  APTT -- -- -- -- -- --  LABPROT 24.5* -- -- 24.9* -- --  INR 2.16* -- -- 2.21* -- --  HEPARINUNFRC -- -- -- -- -- --  CREATININE 0.56 -- -- -- 0.70 --  CKTOTAL 23 23 -- -- -- --  CKMB 3.1 2.7 -- -- -- --  TROPONINI <0.30 <0.30 <0.30 -- -- --   Estimated Creatinine Clearance: 54.1 ml/min (by C-G formula based on Cr of 0.56).    Assessment: 78 y/o female patient admitted with syncope, on chronic coumadin for h/o afib. INR therapeutic.  Goal of Therapy:  INR 2-3   Plan:  Coumadin 5mg  today as at home and f/u daily protime.  Celedonio Miyamoto, PharmD, BCPS Clinical Pharmacist Pager [phone]  09/13/2011,9:22 AM

## 2011-09-14 DIAGNOSIS — I4891 Unspecified atrial fibrillation: Principal | ICD-10-CM

## 2011-09-14 LAB — GLUCOSE, CAPILLARY
Glucose-Capillary: 197 mg/dL — ABNORMAL HIGH (ref 70–99)
Glucose-Capillary: 147 mg/dL — ABNORMAL HIGH (ref 70–99)
Glucose-Capillary: 102 mg/dL — ABNORMAL HIGH (ref 70–99)
Glucose-Capillary: 254 mg/dL — ABNORMAL HIGH (ref 70–99)
Glucose-Capillary: 106 mg/dL — ABNORMAL HIGH (ref 70–99)

## 2011-09-14 MED ORDER — INSULIN ASPART 100 UNIT/ML ~~LOC~~ SOLN
0.0000 [IU] | Freq: Three times a day (TID) | SUBCUTANEOUS | Status: DC
  Administered 2011-09-14: 3 [IU] via SUBCUTANEOUS
  Administered 2011-09-15: 5 [IU] via SUBCUTANEOUS
  Administered 2011-09-15: 3 [IU] via SUBCUTANEOUS
  Administered 2011-09-15: 8 [IU] via SUBCUTANEOUS
  Administered 2011-09-16: 5 [IU] via SUBCUTANEOUS
  Administered 2011-09-16: 3 [IU] via SUBCUTANEOUS
  Administered 2011-09-16: 8 [IU] via SUBCUTANEOUS
  Administered 2011-09-17: 3 [IU] via SUBCUTANEOUS
  Administered 2011-09-17 – 2011-09-18 (×3): 5 [IU] via SUBCUTANEOUS
  Administered 2011-09-18: 4 [IU] via SUBCUTANEOUS
  Administered 2011-09-18: 5 [IU] via SUBCUTANEOUS
  Administered 2011-09-19: 8 [IU] via SUBCUTANEOUS
  Administered 2011-09-19: 2 [IU] via SUBCUTANEOUS

## 2011-09-14 MED ORDER — WARFARIN SODIUM 10 MG PO TABS
10.0000 mg | ORAL_TABLET | Freq: Once | ORAL | Status: AC
  Administered 2011-09-14: 10 mg via ORAL
  Filled 2011-09-14: qty 1

## 2011-09-14 MED ORDER — DOFETILIDE 250 MCG PO CAPS
250.0000 ug | ORAL_CAPSULE | Freq: Two times a day (BID) | ORAL | Status: DC
  Administered 2011-09-14 – 2011-09-19 (×10): 250 ug via ORAL
  Filled 2011-09-14 (×12): qty 1

## 2011-09-14 MED ORDER — DILTIAZEM HCL ER COATED BEADS 300 MG PO CP24
300.0000 mg | ORAL_CAPSULE | Freq: Every day | ORAL | Status: DC
  Administered 2011-09-15 – 2011-09-16 (×2): 300 mg via ORAL
  Filled 2011-09-14 (×3): qty 1

## 2011-09-14 NOTE — Consult Note (Signed)
ELECTROPHYSIOLOGY CONSULT NOTE    Patient ID: Alexandria [name] MRN: [id number], DOB/AGE: 10/16/1929 78 y.o.  Admit date: 09/12/2011 Date of Consult: 09-14-2011  Primary Physician: [name], MD, MD Primary Cardiologist: [name], MD  Reason for Consultation: atrial fibrillation  HPI: Alexandria [name] is a 78 year old female with a past medical history of hypertension, diabetes, hyperlipidemia, chronic diastolic heart failure, coronary artery disease status post PCI/DES to LCX in 2013 (NSTEMI at that time), carotid stenosis, pulmonary fibrosis, and atrial fibrillation.  She reports that she has had afib for 2 years.  She was hospitalized at that time but then did well with only rate control until several weeks ago.    She developed recurrent symptomatic afib and was started on Tikosyn earlier this month.  She was initially tried on twice daily, but her QTc prolonged to and her dose was reduced to twice daily.  After discharge, she states she has had generalized weakness.  On 3-17 when she was at home, got up to go eat breakfast.  While she was in the bathroom getting ready to comb her hair she became "dizzy" and "weak".  She felt as if she might pass out but did not have full LOC.  She felt the room start to spin, tried to get back to the bedroom, and fell to the floor.  She was on the floor for a few minutes, but was eventually able to get to the telephone to call 911.  She did not have tachy palpitations at that time.  Since admission, she has had periods of atrial fibrillation and atrial flutter that correspond with her symptoms of weakness and "uneasiness" in her chest.     Last echo in 2010 with EF 55-60%, LA 41mm, grade II diastolic dysfunction  Pt complains of daytime somnolence but is unsure if she snores as she lives alone.   Physical Exam: Filed Vitals:   09/14/11 0456 09/14/11 0814 09/14/11 1214 09/14/11 1430  BP: 170/59 142/54 168/62 160/67  Pulse: 79 69  75 73  Temp: 98.4 F (36.9 C) 98.4 F (36.9 C) 98.6 F (37 C) 98.4 F (36.9 C)  TempSrc: Oral Oral Oral Oral  Resp: 29 14 18 18   Height:      Weight:      SpO2: 93% 98% 100% 94%    GEN- The patient is elderly appearing, alert and oriented x 3 today.   Head- normocephalic, atraumatic Eyes-  Sclera clear, conjunctiva pink Ears- hearing intact Oropharynx- clear Neck- supple Lungs- Clear to ausculation bilaterally, normal work of breathing Heart- Regular rate and rhythm,  Extremities- no clubbing, cyanosis, 1+ edema MS-age appropriate atrophy Skin- no rash or lesion Psych- euthymic mood, full affect Neuro- strength and sensation are intact   Past Medical History  Diagnosis Date  . Hypertension     with h/o hypertensive urgency  . Glaucoma   . Tremor, essential   . Diabetes mellitus with neuropathy   . B12 deficiency   . Hyperlipidemia   . Atrial fibrillation     a. on chronic coumadin  . Chronic diastolic heart failure     echo 05/2009: mild LVH, EF 55-60%, grade 2 diast dysfxn, mild LAE, PASP  . CAD (coronary artery disease)     a.  LHC 06/2009: pLAD 40%, prox-mid D1 80% (small);  CFX 40-50%, mRCA 20%, EF 65%  -  med Rx;  b.  05/23/11 Myoview - EF 53%, no isch/inf;  c. 07/2011 NSTEMI -  PCI/DES LCX - Resolute stent  . Carotid stenosis     dopplers 6/12: 1-39% bilat ICA  . Pulmonary fibrosis     Dr. Vassie Loll;  patient taken off O2 in 8/12; dyspnea felt CHF>>ILD  . Skin cancer   . Shortness of breath   . Atrial fibrillation   . A-fib 08/31/2011  . Warfarin anticoagulation      Surgical History:  Past Surgical History  Procedure Date  . Appendectomy   . Nose surgery   . Cardiac catheterization      Prescriptions prior to admission  Medication Sig Dispense Refill  . amoxicillin (AMOXIL) 500 MG tablet Take 500 mg by mouth 4 (four) times daily. 10 day course - filled 06/18/11 -pt has stopped taking - 20 capsules remaining in bottle      . Cholecalciferol (VITAMIN D)  1000 UNITS capsule Take 1,000 Units by mouth daily. OTC        . clopidogrel (PLAVIX) 75 MG tablet Take 75 mg by mouth daily.      . colesevelam (WELCHOL) 625 MG tablet Take 1,250 mg by mouth daily.       . Cyanocobalamin (VITAMIN B-12 IJ) Inject 1 application as directed every 30 (thirty) days. Given at the doctor's office at the end of each month      . digoxin (LANOXIN) 0.125 MG tablet Take 125 mcg by mouth daily.      Marland Kitchen diltiazem (CARDIZEM CD) 240 MG 24 hr capsule Take 240 mg by mouth daily.      Marland Kitchen dofetilide (TIKOSYN) 125 MCG capsule Take 125 mcg by mouth 2 (two) times daily.      . fish oil-omega-3 fatty acids 1000 MG capsule Take 1 g by mouth daily.      . furosemide (LASIX) 40 MG tablet Take 40 mg by mouth 2 (two) times daily.      Marland Kitchen glimepiride (AMARYL) 4 MG tablet Take 4 mg by mouth Twice daily.       . isosorbide mononitrate (IMDUR) 30 MG 24 hr tablet Take 30 mg by mouth daily.      Marland Kitchen latanoprost (XALATAN) 0.005 % ophthalmic solution Place 1 drop into both eyes at bedtime.       . magnesium gluconate (MAGONATE) 500 MG tablet Take 500 mg by mouth 2 (two) times daily.      . metFORMIN (GLUCOPHAGE) 1000 MG tablet Take 1,000 mg by mouth 2 (two) times daily with a meal.      . metoprolol (LOPRESSOR) 50 MG tablet Take 50 mg by mouth 2 (two) times daily.      . nitroGLYCERIN (NITROSTAT) 0.4 MG SL tablet Place 0.4 mg under the tongue every 5 (five) minutes as needed. As needed for chest pain      . NOVOLOG MIX 70/30 FLEXPEN (70-30) 100 UNIT/ML injection Inject 20-34 Units into the skin Twice daily. 34 units in the morning & 20 units in the evening      . potassium chloride SA (K-DUR,KLOR-CON) 20 MEQ tablet Take 40 mEq by mouth 2 (two) times daily.      Marland Kitchen warfarin (COUMADIN) 5 MG tablet Take 5-10 mg by mouth daily. Alternates days and takes 10 mg on Tuesdays, Thursdays & Saturday.  & 5 mg other days of the week.        Inpatient Medications:    . clopidogrel  75 mg Oral Daily  . digoxin   125 mcg Oral Daily  . diltiazem  240 mg Oral Daily  .  dofetilide  125 mcg Oral BID  . furosemide  40 mg Oral BID  . insulin aspart  0-15 Units Subcutaneous TID WC  . insulin aspart protamine-insulin aspart  15 Units Subcutaneous BID AC  . isosorbide mononitrate  30 mg Oral Daily  . latanoprost  1 drop Both Eyes QHS  . magnesium oxide  200 mg Oral Daily  . metFORMIN  1,000 mg Oral BID WC  . metoprolol tartrate  50 mg Oral QID  . omega-3 acid ethyl esters  1 g Oral Daily  . potassium chloride SA  40 mEq Oral BID  . warfarin  10 mg Oral ONCE-1800   Allergies:  Allergies  Allergen Reactions  . Ace Inhibitors Cough    Enalapril  benicor    . Crestor (Rosuvastatin Calcium)     Fatigue and leg pain   . Lipitor (Atorvastatin Calcium)     Myalgias   . Niaspan (Niacin)     Intol   . Olmesartan Cough  . Prednisone     REACTION: "retained fluid"    History   Social History  . Marital Status: Widowed    Spouse Name: N/A    Number of Children: N/A  . Years of Education: N/A   Social History Main Topics  . Smoking status: Never Smoker   . Smokeless tobacco: Never Used  . Alcohol Use: No  . Drug Use: No  . Sexually Active: No   Family History  Problem Relation Age of Onset  . Throat cancer Brother   . Prostate cancer Father   . Diabetes Mother   . Diabetes Brother     multiple  . Heart disease Brother     Labs:   Lab Results  Component Value Date   WBC 11.3* 09/12/2011   HGB 13.6 09/12/2011   HCT 40.0 09/12/2011   MCV 82.7 09/12/2011   PLT 346 09/12/2011    Lab 09/13/11 0411  NA 138  K 3.7  CL 103  CO2 29  BUN 13  CREATININE 0.56  CALCIUM 9.8  PROT --  BILITOT --  ALKPHOS --  ALT --  AST --  GLUCOSE 143*   Lab Results  Component Value Date   CKTOTAL 26 09/13/2011   CKMB 3.4 09/13/2011   TROPONINI <0.30 09/13/2011    Radiology/Studies: Dg Chest Port 1 View 09/12/2011  *RADIOLOGY REPORT*  Clinical Data: Short of breath  PORTABLE CHEST - 1 VIEW   Comparison: Chest radiograph 09/05/2011  Findings: Stable mildly enlarged heart silhouette.  No effusion, infiltrate, pneumothorax.  Chronic bronchitic markings. No acute osseous abnormality.  IMPRESSION: No acute cardiopulmonary process.  Original Report Authenticated By: Genevive Bi, M.D.   TELEMETRY: sinus rhythm with short runs of atrial flutter and atrial fibrillation  Assessment/Impression: 1.  Afib/ atrial flutter-  The patient has symptomatic afib and atrial flutter.  She has been placed on low dose tikosyn with some improvement.  Her QT is not significantly lengthened at this time.  Though she did have some qt prolongation with previously, her qt does not appear to have been .  I think that at this point, the most reasonably strategy is to retry tikosyn bid.  We will follow her QT closely while receiving this dose over the next 5 doses.  If her afib is improved and her qt remains <500, then this would be our strategy going forward.  If she has further arrhythmias or does not tolerate this dose due to qt prolongation then we  will have to consider other options.   She carries a diagnosis of pulmonary fibrosis and therefore we have avoided amiodarone.  If she fails medical therapy with tikosyn, it may be reasonable to have pulmonary see her to consider a trial of amiodarone. I will obtain an echo to evaluate her LA size and heart function/ structure.  She may benefit from an outpatient sleep study. She would be high risk for catheter ablation and therefore I think that we should avoid this if possible. Continue coumadin longterm.

## 2011-09-14 NOTE — Progress Notes (Signed)
Report called to Alexandria Hamilton RN. Pt transferred to 2040 via bed with belongings at side. VS stable, pt. comfortable with no complaints. Pt will update family about transfer. Alexandria [name] L

## 2011-09-14 NOTE — Progress Notes (Signed)
SUBJECTIVE:  She said that she felt very weak last night.  She is better now.  Breathing "OK".   PHYSICAL EXAM Filed Vitals:   09/13/11 2100 09/14/11 0028 09/14/11 0350 09/14/11 0456  BP: 135/55 157/56  170/59  Pulse: 66 73 80 79  Temp: 98.8 F (37.1 C) 100.1 F (37.8 C) 98.2 F (36.8 C) 98.4 F (36.9 C)  TempSrc: Oral Oral Oral Oral  Resp: 19 26 26 29   Height:      Weight:  157 lb 6.5 oz (71.4 kg)    SpO2: 95% 93% 94% 93%   General:  No distress Lungs:  Basilar crackles unchanged Heart:  RRR tachy Abdomen:  Positive bowel sounds, no rebound no guarding Extremities:  No edema.  LABS: Lab Results  Component Value Date   CKTOTAL 26 09/13/2011   CKMB 3.4 09/13/2011   TROPONINI <0.30 09/13/2011   Results for orders placed during the hospital encounter of 09/12/11 (from the past 24 hour(s))  GLUCOSE, CAPILLARY     Status: Abnormal   Collection Time   09/13/11  8:39 AM      Component Value Range   Glucose-Capillary 153 (*) 70 - 99 (mg/dL)   Comment 1 Notify RN    CARDIAC PANEL(CRET KIN+CKTOT+MB+TROPI)     Status: Normal   Collection Time   09/13/11 10:10 AM      Component Value Range   Total CK 26  7 - 177 (U/L)   CK, MB 3.4  0.3 - 4.0 (ng/mL)   Troponin I <0.30  <0.30 (ng/mL)   Relative Index RELATIVE INDEX IS INVALID  0.0 - 2.5   GLUCOSE, CAPILLARY     Status: Abnormal   Collection Time   09/13/11 12:32 PM      Component Value Range   Glucose-Capillary 181 (*) 70 - 99 (mg/dL)  GLUCOSE, CAPILLARY     Status: Abnormal   Collection Time   09/13/11  4:39 PM      Component Value Range   Glucose-Capillary 197 (*) 70 - 99 (mg/dL)   Comment 1 Documented in Chart     Comment 2 Notify RN    GLUCOSE, CAPILLARY     Status: Abnormal   Collection Time   09/13/11  9:26 PM      Component Value Range   Glucose-Capillary 147 (*) 70 - 99 (mg/dL)   Comment 1 Documented in Chart     Comment 2 Notify RN    PROTIME-INR     Status: Abnormal   Collection Time   09/14/11  4:05 AM     Component Value Range   Prothrombin Time 24.0 (*) 11.6 - 15.2 (seconds)   INR 2.11 (*) 0.00 - 1.49     Intake/Output Summary (Last 24 hours) at 09/14/11 0631 Last data filed at 09/14/11 0544  Gross per 24 hour  Intake   3235 ml  Output   4125 ml  Net   -890 ml    ASSESSMENT AND PLAN:   1) A-fib:  Still with paroxysms of atrial arrhythmias on admission.  Continue Tikosyn.  Beta blocker increased on admission.  I will ask EP Dr Johney Frame to see today.  She has been on therapeutic warfarin. Her weakness does correlate with runs of atrial arrhythmias.    2) HYPERTENSION: This could have contributed to admission complaints.  BP labile.  Increase the Diltiazem to 300 mg daily.  3) CAD: No evidence of ischemia. Enzymes negative.  Of note she is on warfarin, ASA  and Plavix.  I will stop the ASA.   [name] 09/14/2011 6:31 AM

## 2011-09-14 NOTE — Progress Notes (Signed)
ANTICOAGULATION CONSULT NOTE - Follow up Consult  Pharmacy Consult for Coumadin Indication: atrial fibrillation  Allergies  Allergen Reactions  . Ace Inhibitors Cough    Enalapril  benicor    . Crestor (Rosuvastatin Calcium)     Fatigue and leg pain   . Lipitor (Atorvastatin Calcium)     Myalgias   . Niaspan (Niacin)     Intol   . Olmesartan Cough  . Prednisone     REACTION: "retained fluid"     Vital Signs: Temp: 98.4 F (36.9 C) (03/19 0814) Temp src: Oral (03/19 0814) BP: 142/54 mmHg (03/19 0814) Pulse Rate: 69  (03/19 0814)  Labs:  Basename 09/14/11 0405 09/13/11 1010 09/13/11 0411 09/12/11 2334 09/12/11 1540 09/12/11 1516 09/12/11 1430  HGB -- -- -- -- -- 13.6 13.4  HCT -- -- -- -- -- 40.0 38.6  PLT -- -- -- -- -- -- 346  APTT -- -- -- -- -- -- --  LABPROT 24.0* -- 24.5* -- 24.9* -- --  INR 2.11* -- 2.16* -- 2.21* -- --  HEPARINUNFRC -- -- -- -- -- -- --  CREATININE -- -- 0.56 -- -- 0.70 --  CKTOTAL -- 26 23 23  -- -- --  CKMB -- 3.4 3.1 2.7 -- -- --  TROPONINI -- <0.30 <0.30 <0.30 -- -- --   Estimated Creatinine Clearance: 55.3 ml/min (by C-G formula based on Cr of 0.56).    Assessment: 78 y/o female patient admitted with syncope, on chronic coumadin for h/o afib. INR therapeutic.  Home dose 5mg  daily except 10mg  t/th/sat.   Goal of Therapy:  INR 2-3   Plan:  Coumadin 10mg  today as at home and f/u daily protime.  Celedonio Miyamoto, PharmD, BCPS Clinical Pharmacist Pager [phone]  09/14/2011,8:27 AM

## 2011-09-15 ENCOUNTER — Other Ambulatory Visit: Payer: Self-pay

## 2011-09-15 DIAGNOSIS — I359 Nonrheumatic aortic valve disorder, unspecified: Secondary | ICD-10-CM

## 2011-09-15 LAB — GLUCOSE, CAPILLARY
Glucose-Capillary: 172 mg/dL — ABNORMAL HIGH (ref 70–99)
Glucose-Capillary: 257 mg/dL — ABNORMAL HIGH (ref 70–99)

## 2011-09-15 LAB — PROTIME-INR: INR: 2.31 — ABNORMAL HIGH (ref 0.00–1.49)

## 2011-09-15 LAB — BASIC METABOLIC PANEL
CO2: 24 mEq/L (ref 19–32)
Calcium: 9.7 mg/dL (ref 8.4–10.5)
GFR calc non Af Amer: 87 mL/min — ABNORMAL LOW (ref >90)
Glucose, Bld: 167 mg/dL — ABNORMAL HIGH (ref 70–99)
Potassium: 4.2 mEq/L (ref 3.5–5.1)
Sodium: 135 mEq/L (ref 135–145)

## 2011-09-15 LAB — MAGNESIUM: Magnesium: 1.2 mg/dL — ABNORMAL LOW (ref 1.5–2.5)

## 2011-09-15 MED ORDER — WARFARIN SODIUM 10 MG PO TABS
10.0000 mg | ORAL_TABLET | ORAL | Status: AC
  Administered 2011-09-16: 10 mg via ORAL
  Filled 2011-09-15: qty 1

## 2011-09-15 MED ORDER — WARFARIN SODIUM 5 MG PO TABS
5.0000 mg | ORAL_TABLET | ORAL | Status: AC
  Administered 2011-09-15: 5 mg via ORAL
  Filled 2011-09-15: qty 1

## 2011-09-15 MED ORDER — MAGNESIUM SULFATE 50 % IJ SOLN
3.0000 g | Freq: Once | INTRAVENOUS | Status: AC
  Administered 2011-09-15: 3 g via INTRAVENOUS
  Filled 2011-09-15: qty 6

## 2011-09-15 NOTE — Progress Notes (Signed)
*  PRELIMINARY RESULTS* Echocardiogram 2D Echocardiogram has been performed.  Glean Salen [facility] 09/15/2011, 10:40 AM

## 2011-09-15 NOTE — Progress Notes (Signed)
   CARE MANAGEMENT NOTE 09/15/2011  Patient:  Alexandria [name], Alexandria [name]   Account Number:  [id number]  Date Initiated:  09/15/2011  Documentation initiated by:  GRAVES-BIGELOW,Delphine Sizemore  Subjective/Objective Assessment:   Pt admitted  with Chest pain and palpitations. Pt was started on tikosyn prior to admission. Currently on low dose tikosyn. Pt is from home and has [facility] for RN services.     Action/Plan:   MD if pt is plan for d/c  home when stable-please write resumption orders for [facility]. Thanks   Anticipated DC Date:  09/19/2011   Anticipated DC Plan:  HOME W HOME HEALTH SERVICES      DC Planning Services  CM consult      [facility] Choice  Resumption Of Svcs/PTA Provider   Choice offered to / List presented to:             Status of service:  In process, will continue to follow Medicare Important Message given?   (If response is "NO", the following Medicare IM given date fields will be blank) Date Medicare IM given:   Date Additional Medicare IM given:    Discharge Disposition:    Per UR Regulation:    If discussed at Long Length of Stay Meetings, dates discussed:    Comments:  09-15-11 1441 Tomi Bamberger, RN,BSN [phone] CM will continue to monitor for d/c disposition needs.

## 2011-09-15 NOTE — Progress Notes (Signed)
SUBJECTIVE:  She slept well.  Still weak.  No pain. Breathing "OK".   PHYSICAL EXAM Filed Vitals:   09/14/11 1430 09/14/11 2150 09/15/11 0500 09/15/11 1330  BP: 160/67 137/70 165/83 141/60  Pulse: 73 64 62 67  Temp: 98.4 F (36.9 C) 97.6 F (36.4 C) 97.4 F (36.3 C) 97.8 F (36.6 C)  TempSrc: Oral Oral Oral Tympanic  Resp: 18 18 18 18   Height:      Weight:      SpO2: 94% 93% 94% 92%   General:  No distress Lungs:  Basilar crackles unchanged Heart:  RRR tachy Abdomen:  Positive bowel sounds, no rebound no guarding Extremities:  No edema.  LABS: Lab Results  Component Value Date   CKTOTAL 26 09/13/2011   CKMB 3.4 09/13/2011   TROPONINI <0.30 09/13/2011   Results for orders placed during the hospital encounter of 09/12/11 (from the past 24 hour(s))  GLUCOSE, CAPILLARY     Status: Abnormal   Collection Time   09/14/11  4:39 PM      Component Value Range   Glucose-Capillary 162 (*) 70 - 99 (mg/dL)   Comment 1 Documented in Chart     Comment 2 Notify RN    GLUCOSE, CAPILLARY     Status: Abnormal   Collection Time   09/14/11  9:46 PM      Component Value Range   Glucose-Capillary 106 (*) 70 - 99 (mg/dL)   Comment 1 Documented in Chart     Comment 2 Notify RN    PROTIME-INR     Status: Abnormal   Collection Time   09/15/11  5:15 AM      Component Value Range   Prothrombin Time 25.8 (*) 11.6 - 15.2 (seconds)   INR 2.31 (*) 0.00 - 1.49   BASIC METABOLIC PANEL     Status: Abnormal   Collection Time   09/15/11  5:15 AM      Component Value Range   Sodium 135  135 - 145 (mEq/L)   Potassium 4.2  3.5 - 5.1 (mEq/L)   Chloride 101  96 - 112 (mEq/L)   CO2 24  19 - 32 (mEq/L)   Glucose, Bld 167 (*) 70 - 99 (mg/dL)   BUN 8  6 - 23 (mg/dL)   Creatinine, Ser 4.09  0.50 - 1.10 (mg/dL)   Calcium 9.7  8.4 - 81.1 (mg/dL)   GFR calc non Af Amer 87 (*) >90 (mL/min)   GFR calc Af Amer >90  >90 (mL/min)  MAGNESIUM     Status: Abnormal   Collection Time   09/15/11  5:15 AM   Component Value Range   Magnesium 1.2 (*) 1.5 - 2.5 (mg/dL)  GLUCOSE, CAPILLARY     Status: Abnormal   Collection Time   09/15/11  6:57 AM      Component Value Range   Glucose-Capillary 172 (*) 70 - 99 (mg/dL)   Comment 1 Notify RN     Comment 2 Documented in Chart    GLUCOSE, CAPILLARY     Status: Abnormal   Collection Time   09/15/11 11:26 AM      Component Value Range   Glucose-Capillary 233 (*) 70 - 99 (mg/dL)   Comment 1 Notify RN      Intake/Output Summary (Last 24 hours) at 09/15/11 1419 Last data filed at 09/15/11 1300  Gross per 24 hour  Intake   1200 ml  Output   1552 ml  Net   -352  ml    ASSESSMENT AND PLAN:   1) A-fib:  Still with paroxysms of atrial arrhythmias on admission.  Dr.  Johney Frame saw her and increased the Tikosyn to 250 mcg bid.  QT is thus far tolerating that at 435.  She has had 2 of the higher dose.  Dr. Johney Frame would like for her to stay through 5 doses total.  2) HYPERTENSION: This could have contributed to admission complaints.  BP labile. I increased the Diltiazem to 300 mg daily yesteday and BP is slightly better.Marland Kitchen  3) CAD: No evidence of ischemia. Enzymes negative.  Of note she is on warfarin, ASA and Plavix.  I stopped the ASA.   [name] 09/15/2011 2:19 PM

## 2011-09-15 NOTE — Progress Notes (Signed)
Pharmacist notified me about Mg level of 1.2 so I notified MD.  Will continue to monitor patient.

## 2011-09-15 NOTE — Progress Notes (Signed)
ANTICOAGULATION CONSULT NOTE - Follow Up Consult  Pharmacy Consult for : Coumadin Indication: atrial fibrillation  Allergies  Allergen Reactions  . Ace Inhibitors Cough    Enalapril  benicor    . Crestor (Rosuvastatin Calcium)     Fatigue and leg pain   . Lipitor (Atorvastatin Calcium)     Myalgias   . Niaspan (Niacin)     Intol   . Olmesartan Cough  . Prednisone     REACTION: "retained fluid"    Patient Measurements: Height: 5\' 4"  (162.6 cm) Weight: 157 lb 6.5 oz (71.4 kg) IBW/kg (Calculated) : 54.7    Vital Signs: Temp: 97.8 F (36.6 C) (03/20 1330) Temp src: Tympanic (03/20 1330) BP: 141/60 mmHg (03/20 1330) Pulse Rate: 67  (03/20 1330)  Labs:  Basename 09/15/11 0515 09/14/11 0405 09/13/11 1010 09/13/11 0411 09/12/11 2334 09/12/11 1516 09/12/11 1430  HGB -- -- -- -- -- 13.6 13.4  HCT -- -- -- -- -- 40.0 38.6  PLT -- -- -- -- -- -- 346  APTT -- -- -- -- -- -- --  LABPROT 25.8* 24.0* -- 24.5* -- -- --  INR 2.31* 2.11* -- 2.16* -- -- --  HEPARINUNFRC -- -- -- -- -- -- --  CREATININE 0.54 -- -- 0.56 -- 0.70 --  CKTOTAL -- -- 26 23 23  -- --  CKMB -- -- 3.4 3.1 2.7 -- --  TROPONINI -- -- <0.30 <0.30 <0.30 -- --   Estimated Creatinine Clearance: 55.3 ml/min (by C-G formula based on Cr of 0.54).   Medications:  Prescriptions prior to admission  Medication Sig Dispense Refill  . amoxicillin (AMOXIL) 500 MG tablet Take 500 mg by mouth 4 (four) times daily. 10 day course - filled 06/18/11 -pt has stopped taking - 20 capsules remaining in bottle      . Cholecalciferol (VITAMIN D) 1000 UNITS capsule Take 1,000 Units by mouth daily. OTC        . clopidogrel (PLAVIX) 75 MG tablet Take 75 mg by mouth daily.      . colesevelam (WELCHOL) 625 MG tablet Take 1,250 mg by mouth daily.       . Cyanocobalamin (VITAMIN B-12 IJ) Inject 1 application as directed every 30 (thirty) days. Given at the doctor's office at the end of each month      . digoxin (LANOXIN) 0.125 MG  tablet Take 125 mcg by mouth daily.      Marland Kitchen diltiazem (CARDIZEM CD) 240 MG 24 hr capsule Take 240 mg by mouth daily.      Marland Kitchen dofetilide (TIKOSYN) 125 MCG capsule Take 125 mcg by mouth 2 (two) times daily.      . fish oil-omega-3 fatty acids 1000 MG capsule Take 1 g by mouth daily.      . furosemide (LASIX) 40 MG tablet Take 40 mg by mouth 2 (two) times daily.      Marland Kitchen glimepiride (AMARYL) 4 MG tablet Take 4 mg by mouth Twice daily.       . isosorbide mononitrate (IMDUR) 30 MG 24 hr tablet Take 30 mg by mouth daily.      Marland Kitchen latanoprost (XALATAN) 0.005 % ophthalmic solution Place 1 drop into both eyes at bedtime.       . magnesium gluconate (MAGONATE) 500 MG tablet Take 500 mg by mouth 2 (two) times daily.      . metFORMIN (GLUCOPHAGE) 1000 MG tablet Take 1,000 mg by mouth 2 (two) times daily with a meal.      .  metoprolol (LOPRESSOR) 50 MG tablet Take 50 mg by mouth 2 (two) times daily.      . nitroGLYCERIN (NITROSTAT) 0.4 MG SL tablet Place 0.4 mg under the tongue every 5 (five) minutes as needed. As needed for chest pain      . NOVOLOG MIX 70/30 FLEXPEN (70-30) 100 UNIT/ML injection Inject 20-34 Units into the skin Twice daily. 34 units in the morning & 20 units in the evening      . potassium chloride SA (K-DUR,KLOR-CON) 20 MEQ tablet Take 40 mEq by mouth 2 (two) times daily.      Marland Kitchen warfarin (COUMADIN) 5 MG tablet Take 5-10 mg by mouth daily. Alternates days and takes 10 mg on Tuesdays, Thursdays & Saturday.  & 5 mg other days of the week.       Scheduled:    . clopidogrel  75 mg Oral Daily  . digoxin  125 mcg Oral Daily  . diltiazem  300 mg Oral Daily  . dofetilide  250 mcg Oral BID  . furosemide  40 mg Oral BID  . insulin aspart  0-15 Units Subcutaneous TID WC  . insulin aspart protamine-insulin aspart  15 Units Subcutaneous BID AC  . isosorbide mononitrate  30 mg Oral Daily  . latanoprost  1 drop Both Eyes QHS  . magnesium oxide  200 mg Oral Daily  . metFORMIN  1,000 mg Oral BID WC  .  metoprolol tartrate  50 mg Oral QID  . omega-3 acid ethyl esters  1 g Oral Daily  . potassium chloride SA  40 mEq Oral BID  . warfarin  10 mg Oral ONCE-1800  . warfarin  10 mg Oral Q T,Th,Sat-1800  . warfarin  5 mg Oral Q M,W,F,Su-1800  . Warfarin - Pharmacist Dosing Inpatient   Does not apply q1800  . DISCONTD: diltiazem  240 mg Oral Daily  . DISCONTD: dofetilide  125 mcg Oral BID    Assessment:  78 y/o female patient admitted with syncope, on chronic coumadin for h/o afib. INR therapeutic.   INR therapeutic on home dose schedule.   Goal of Therapy:   INR 2-3   Plan:   Continue home schedule for Coumadin 5 mg daily except 10 mg Tu-Th-Sat.  Breeanna Galgano, Elisha Headland, Pharm.D. 09/15/2011 2:07 PM

## 2011-09-16 ENCOUNTER — Other Ambulatory Visit: Payer: Self-pay

## 2011-09-16 LAB — GLUCOSE, CAPILLARY
Glucose-Capillary: 260 mg/dL — ABNORMAL HIGH (ref 70–99)
Glucose-Capillary: 191 mg/dL — ABNORMAL HIGH (ref 70–99)

## 2011-09-16 LAB — PROTIME-INR
INR: 2.75 — ABNORMAL HIGH (ref 0.00–1.49)
Prothrombin Time: 29.5 seconds — ABNORMAL HIGH (ref 11.6–15.2)

## 2011-09-16 MED ORDER — MAGNESIUM OXIDE 400 MG PO TABS
800.0000 mg | ORAL_TABLET | Freq: Every day | ORAL | Status: DC
  Administered 2011-09-16 – 2011-09-18 (×3): 800 mg via ORAL
  Filled 2011-09-16 (×3): qty 2

## 2011-09-16 MED ORDER — METOPROLOL TARTRATE 50 MG PO TABS
50.0000 mg | ORAL_TABLET | Freq: Four times a day (QID) | ORAL | Status: DC
  Filled 2011-09-16 (×5): qty 1

## 2011-09-16 NOTE — Progress Notes (Signed)
ANTICOAGULATION CONSULT NOTE - Follow Up Consult  Pharmacy Consult for Coumadin Indication: atrial fibrillation  Allergies  Allergen Reactions  . Ace Inhibitors Cough    Enalapril  benicor    . Crestor (Rosuvastatin Calcium)     Fatigue and leg pain   . Lipitor (Atorvastatin Calcium)     Myalgias   . Niaspan (Niacin)     Intol   . Olmesartan Cough  . Prednisone     REACTION: "retained fluid"   Vital Signs: Temp: 97.8 F (36.6 C) (03/21 0427) Temp src: Oral (03/21 0427) BP: 163/66 mmHg (03/21 0427) Pulse Rate: 56  (03/21 0427)  Labs:  Basename 09/16/11 0502 09/15/11 0515 09/14/11 0405 09/13/11 1010  HGB -- -- -- --  HCT -- -- -- --  PLT -- -- -- --  APTT -- -- -- --  LABPROT 29.5* 25.8* 24.0* --  INR 2.75* 2.31* 2.11* --  HEPARINUNFRC -- -- -- --  CREATININE -- 0.54 -- --  CKTOTAL -- -- -- 26  CKMB -- -- -- 3.4  TROPONINI -- -- -- <0.30   Estimated Creatinine Clearance: 55.3 ml/min (by C-G formula based on Cr of 0.54).  Assessment: 81yof continues on coumadin with a therapeutic INR. No bleeding noted per chart notes. Tolerating the increased dose of tikosyn. Mg better after 3g IV x 1 given 3/20.  Goal of Therapy:  INR 2-3   Plan:  1) Continue coumadin 5mg  daily except 10mg  TTSat. 2) Follow up INR in AM  Fredrik Rigger 09/16/2011,9:20 AM

## 2011-09-16 NOTE — Progress Notes (Signed)
Patient"s ECG was performed as a result of tyrosin administration @ 0000. QT/QTc was 460/447 ms. Graph is in the chart. Alexandria [name]

## 2011-09-16 NOTE — Progress Notes (Signed)
Made aware of patients HR in the 40's, went to check on patient and she was asymtomatic.  Vitals were stable other than HR.  MD made aware.  Will continue to monitor.

## 2011-09-16 NOTE — Progress Notes (Signed)
SUBJECTIVE:  She feels well.  She has had no palpitations and no chest pain or SOB.   PHYSICAL EXAM Filed Vitals:   09/15/11 1330 09/15/11 2000 09/15/11 2208 09/16/11 0427  BP: 141/60 125/63 123/63 163/66  Pulse: 67 52 94 56  Temp: 97.8 F (36.6 C) 98.9 F (37.2 C)  97.8 F (36.6 C)  TempSrc: Tympanic Oral  Oral  Resp: 18 18  18   Height:      Weight:      SpO2: 92% 94%  94%   General:  No distress Lungs:  Basilar crackles unchanged Heart:  RRR tachy Abdomen:  Positive bowel sounds, no rebound no guarding Extremities:  No edema.  LABS:  Results for orders placed during the hospital encounter of 09/12/11 (from the past 24 hour(s))  GLUCOSE, CAPILLARY     Status: Abnormal   Collection Time   09/15/11 11:26 AM      Component Value Range   Glucose-Capillary 233 (*) 70 - 99 (mg/dL)   Comment 1 Notify RN    GLUCOSE, CAPILLARY     Status: Abnormal   Collection Time   09/15/11  4:30 PM      Component Value Range   Glucose-Capillary 257 (*) 70 - 99 (mg/dL)   Comment 1 Documented in Chart     Comment 2 Notify RN    GLUCOSE, CAPILLARY     Status: Abnormal   Collection Time   09/15/11  8:32 PM      Component Value Range   Glucose-Capillary 143 (*) 70 - 99 (mg/dL)   Comment 1 Documented in Chart     Comment 2 Notify RN    PROTIME-INR     Status: Abnormal   Collection Time   09/16/11  5:02 AM      Component Value Range   Prothrombin Time 29.5 (*) 11.6 - 15.2 (seconds)   INR 2.75 (*) 0.00 - 1.49   MAGNESIUM     Status: Normal   Collection Time   09/16/11  5:02 AM      Component Value Range   Magnesium 1.9  1.5 - 2.5 (mg/dL)  GLUCOSE, CAPILLARY     Status: Abnormal   Collection Time   09/16/11  5:33 AM      Component Value Range   Glucose-Capillary 233 (*) 70 - 99 (mg/dL)    Intake/Output Summary (Last 24 hours) at 09/16/11 0835 Last data filed at 09/16/11 0244  Gross per 24 hour  Intake    600 ml  Output   1527 ml  Net   -927 ml   Lab Results  Component Value  Date   CALCIUM 9.7 09/15/2011    ASSESSMENT AND PLAN:   1) A-fib:  Still with paroxysms of atrial arrhythmias on admission.  No further paroxysms.  Dr.  Johney Frame saw her and increased the Tikosyn to 250 mcg bid.  QT is thus far tolerating that at 450 today.  She has had 3 of the higher dose.  Dr. Johney Frame would like for her to stay through 5 doses total.  Telemetry reviewed.  2) HYPERTENSION: This could have contributed to admission complaints.  BP labile. I increased the Diltiazem to 300 mg daily this admission and BP is slightly better.  3) CAD: No evidence of ischemia. Enzymes negative.  Of note she is on warfarin, ASA and Plavix.  I stopped the ASA.  4)  Hypomagnesemia:  She has had diarrhea thought this has been limited and also could be  losing because of the diuretic.  She was supplemented yesterday.  The level is 1.9 currently. Calcium was normal.  I will increase the PO dose and repeat a level in the am.   [name] 09/16/2011 8:35 AM

## 2011-09-16 NOTE — Progress Notes (Signed)
Inpatient Diabetes Program Recommendations  AACE/ADA: New Consensus Statement on Inpatient Glycemic Control (2009)  Target Ranges:  Prepandial:   less than 140 mg/dL      Peak postprandial:   less than 180 mg/dL (1-2 hours)      Critically ill patients:  140 - 180 mg/dL   Results for Alexandria [name], Alexandria [name] (MRN [id number]) as of 09/16/2011 14:41  Ref. Range 09/15/2011 11:26 09/15/2011 16:30 09/15/2011 20:32 09/16/2011 05:33 09/16/2011 11:35  Glucose-Capillary Latest Range: 70-99 mg/dL 409 (H) 811 (H) 914 (H) 233 (H) 260 (H)    Inpatient Diabetes Program Recommendations Insulin - Basal: Increase 70/30 to 20 BID  Correction (SSI): .

## 2011-09-17 LAB — GLUCOSE, CAPILLARY
Glucose-Capillary: 162 mg/dL — ABNORMAL HIGH (ref 70–99)
Glucose-Capillary: 228 mg/dL — ABNORMAL HIGH (ref 70–99)
Glucose-Capillary: 216 mg/dL — ABNORMAL HIGH (ref 70–99)

## 2011-09-17 LAB — PROTIME-INR
INR: 3.17 — ABNORMAL HIGH (ref 0.00–1.49)
Prothrombin Time: 33 seconds — ABNORMAL HIGH (ref 11.6–15.2)

## 2011-09-17 LAB — MAGNESIUM: Magnesium: 1.5 mg/dL (ref 1.5–2.5)

## 2011-09-17 MED ORDER — MAGNESIUM SULFATE 40 MG/ML IJ SOLN
4.0000 g | Freq: Once | INTRAMUSCULAR | Status: AC
  Administered 2011-09-17: 4 g via INTRAVENOUS
  Filled 2011-09-17 (×2): qty 100

## 2011-09-17 MED ORDER — DILTIAZEM HCL ER COATED BEADS 240 MG PO CP24
240.0000 mg | ORAL_CAPSULE | Freq: Every day | ORAL | Status: DC
  Administered 2011-09-17 – 2011-09-19 (×3): 240 mg via ORAL
  Filled 2011-09-17 (×3): qty 1

## 2011-09-17 MED ORDER — WARFARIN 0.5 MG HALF TABLET
0.5000 mg | ORAL_TABLET | Freq: Once | ORAL | Status: AC
  Administered 2011-09-17: 0.5 mg via ORAL
  Filled 2011-09-17 (×2): qty 1

## 2011-09-17 MED ORDER — METOPROLOL TARTRATE 50 MG PO TABS
50.0000 mg | ORAL_TABLET | Freq: Three times a day (TID) | ORAL | Status: DC
  Administered 2011-09-17 – 2011-09-19 (×7): 50 mg via ORAL
  Filled 2011-09-17 (×9): qty 1

## 2011-09-17 MED ORDER — FUROSEMIDE 40 MG PO TABS
40.0000 mg | ORAL_TABLET | Freq: Every day | ORAL | Status: DC
  Administered 2011-09-18 – 2011-09-19 (×2): 40 mg via ORAL
  Filled 2011-09-17 (×2): qty 1

## 2011-09-17 NOTE — Discharge Instructions (Addendum)
Home Health Services arranged with [facility]. [phone]. 1.Registered Nurse Atrial Fibrillation Your caregiver has diagnosed you with atrial fibrillation (AFib). The heart normally beats very regularly; AFib is a type of irregular heartbeat. The heart rate may be faster or slower than normal. This can prevent your heart from pumping as well as it should. AFib can be constant (chronic) or intermittent (paroxysmal). CAUSES  Atrial fibrillation may be caused by:  Heart disease, including heart attack, coronary artery disease, heart failure, diseases of the heart valves, and others.   Blood clot in the lungs (pulmonary embolism).   Pneumonia or other infections.   Chronic lung disease.   Thyroid disease.   Toxins. These include alcohol, some medications (such as decongestant medications or diet pills), and caffeine.  In some people, no cause for AFib can be found. This is referred to as Lone Atrial Fibrillation. SYMPTOMS   Palpitations or a fluttering in your chest.   A vague sense of chest discomfort.   Shortness of breath.   Sudden onset of lightheadedness or weakness.  Sometimes, the first sign of AFib can be a complication of the condition. This could be a stroke or heart failure. DIAGNOSIS  Your description of your condition may make your caregiver suspicious of atrial fibrillation. Your caregiver will examine your pulse to determine if fibrillation is present. An EKG (electrocardiogram) will confirm the diagnosis. Further testing may help determine what caused you to have atrial fibrillation. This may include chest x-ray, echocardiogram, blood tests, or CT scans. PREVENTION  If you have previously had atrial fibrillation, your caregiver may advise you to avoid substances known to cause the condition (such as stimulant medications, and possibly caffeine or alcohol). You may be advised to use medications to prevent recurrence. Proper treatment of any underlying condition  is important to help prevent recurrence. PROGNOSIS  Atrial fibrillation does tend to become a chronic condition over time. It can cause significant complications (see below). Atrial fibrillation is not usually immediately life-threatening, but it can shorten your life expectancy. This seems to be worse in women. If you have lone atrial fibrillation and are under 52 years old, the risk of complications is very low, and life expectancy is not shortened. RISKS AND COMPLICATIONS  Complications of atrial fibrillation can include stroke, chest pain, and heart failure. Your caregiver will recommend treatments for the atrial fibrillation, as well as for any underlying conditions, to help minimize risk of complications. TREATMENT  Treatment for AFib is divided into several categories:  Treatment of any underlying condition.   Converting you out of AFib into a regular (sinus) rhythm.   Controlling rapid heart rate.   Prevention of blood clots and stroke.  Medications and procedures are available to convert your atrial fibrillation to sinus rhythm. However, recent studies have shown that this may not offer you any advantage, and cardiac experts are continuing research and debate on this topic. More important is controlling your rapid heartbeat. The rapid heartbeat causes more symptoms, and places strain on your heart. Your caregiver will advise you on the use of medications that can control your heart rate. Atrial fibrillation is a strong stroke risk. You can lessen this risk by taking blood thinning medications such as Coumadin (warfarin), or sometimes aspirin. These medications need close monitoring by your caregiver. Over-medication can cause bleeding. Too little medication may not protect against stroke. HOME CARE INSTRUCTIONS   If your caregiver prescribed medicine to make your heartbeat more normally, take as directed.   If  blood thinners were prescribed by your caregiver, take EXACTLY as directed.     Perform blood tests EXACTLY as directed.   Quit smoking. Smoking increases your cardiac and lung (pulmonary) risks.   DO NOT drink alcohol.   DO NOT drink caffeinated drinks (e.g. coffee, soda, chocolate, and leaf teas). You may drink decaffeinated coffee, soda or tea.   If you are overweight, you should choose a reduced calorie diet to lose weight. Please see a registered dietitian if you need more information about healthy weight loss. DO NOT USE DIET PILLS as they may aggravate heart problems.   If you have other heart problems that are causing AFib, you may need to eat a low salt, fat, and cholesterol diet. Your caregiver will tell you if this is necessary.   Exercise every day to improve your physical fitness. Stay active unless advised otherwise.   If your caregiver has given you a follow-up appointment, it is very important to keep that appointment. Not keeping the appointment could result in heart failure or stroke. If there is any problem keeping the appointment, you must call back to this facility for assistance.  SEEK MEDICAL CARE IF:  You notice a change in the rate, rhythm or strength of your heartbeat.   You develop an infection or any other change in your overall health status.  SEEK IMMEDIATE MEDICAL CARE IF:   You develop chest pain, abdominal pain, sweating, weakness or feel sick to your stomach (nausea).   You develop shortness of breath.   You develop swollen feet and ankles.   You develop dizziness, numbness, or weakness of your face or limbs, or any change in vision or speech.  MAKE SURE YOU:   Understand these instructions.   Will watch your condition.   Will get help right away if you are not doing well or get worse.  Document Released: 06/14/2005 Document Revised: 06/03/2011 Document Reviewed: 01/17/2008 Exit[employer] Patient Information 2012 ExitCare, LLCAnticoagulation, Generic Anticoagulants are medications used to prevent clots from developing in  your veins. These medications are also known as blood thinners. If blood clots are untreated, they could travel to your lungs. This is called a pulmonary embolus. A blood clot in your lungs can be fatal.  Caregivers often use anticoagulants to prevent clots following surgery. Anticoagulants are also used along with aspirin when the heart is not getting enough blood. Another anticoagulant called warfarin is started 2 to 3 days after a rapid-acting injectable anticoagulant is started. The rapid-acting anticoagulants are usually continued until warfarin has begun to work. Your caregiver will judge this length of time by measuring a blood test known as the International Normalization Ratio (INR). An INR of 2 to 3 is desirable for most medical conditions. This means that your blood is at the necessary and best level to prevent clots. RISKS AND COMPLICATIONS  If you have received recent epidural anesthesia, spinal anesthesia, or a spinal tap while receiving anticoagulants, you are at risk for developing a blood clot in or around the spine. This condition could result in long-term or permanent paralysis.   Because anticoagulants thin your blood, severe bleeding may occur from any tissue or organ. Symptoms of the blood being too thin may include:   Bleeding from the nose or gums that does not stop quickly.   Unusual bruising or bruising easily.   Swelling or pain at an injection site.   A cut that does not stop bleeding within 10 minutes.   Continual nausea for more  than 1 day or vomiting blood.   Coughing up blood.   Blood in the urine which may appear as pink, red, or brown urine.   Blood in bowel movements which may appear as red, dark or black stools.   Sudden weakness or numbness of the face, arm, or leg, especially on one side of the body.   Sudden confusion.   Trouble speaking (aphasia) or understanding.   Sudden trouble seeing in one or both eyes.   Sudden trouble walking.    Dizziness.   Loss of balance or coordination.   Severe pain, such as a headache, joint pain, or back pain.   Fever.  HOME CARE INSTRUCTIONS   Due to the complications of anticoagulants, it is very important that you take your anticoagulant as directed by your caregiver. Anticoagulants need to be taken exactly as instructed. Be sure you understand all your anticoagulant instructions.   Changes in medicines, supplements, diet, and illness can affect your anticoagulation therapy. Be sure to inform your caregivers of any of these changes.   While on anticoagulants, you will need to have blood tests done routinely as directed by your caregivers.   Be careful not to cut yourself when using sharp objects.   Avoid heavy or variable alcohol use. Consume alcohol only in very limited quantities. General alcohol intake guidelines are 1 drink for nonpregnant women and 2 drinks for men per day. (1 drink = 5 ounces of wine, 12 ounces of beer, or 1  ounces of liquor). A sudden increase in alcohol use can increase your risk of bleeding. Chronic alcohol use can cause warfarin to be less effective.   Limit physical activities or sports that could result in a fall or cause injury.   It is extremely important that you tell all of your caregivers and dentist that you are taking an anticoagulant, especially if you are injured or plan to have any type of procedure or operation.   Follow up with your laboratory test and caregiver appointments as directed. It is very important to keep your appointments. Not keeping appointments could result in a chronic or permanent injury, pain, or disability.  SEEK MEDICAL CARE IF:   You develop any rashes.   You have any worsening of the condition for which you are receiving anticoagulation therapy.  SEEK IMMEDIATE MEDICAL CARE IF:   Bleeding from the nose or gums does not stop quickly.   You have unusual bruising or are bruising easily.   Swelling or pain occurs at  an injection site.   A cut does not stop bleeding within 10 minutes.   You have continual nausea for more than 1 day or are vomiting blood.   You are coughing up blood.   You have blood in the urine.   You have dark or black stools.   You have sudden weakness or numbness of the face, arm, or leg, especially on one side of the body.   You have sudden confusion.   You have trouble speaking (aphasia) or understanding.   You have sudden trouble seeing in one or both eyes.   You have sudden trouble walking.   You have dizziness.   You have a loss of balance or coordination.   You have severe pain, such as a headache, joint pain, or back pain.   You have a serious fall or head injury, even if you are not bleeding.   You have an oral temperature above 102 F (38.9 C), not controlled by  medicine.  ANY OF THESE SYMPTOMS MAY REPRESENT A SERIOUS PROBLEM THAT IS AN EMERGENCY. Do not wait to see if the symptoms will go away. Get medical help right away. Call your local emergency services (911 in [place] DO NOT drive yourself to the hospital. MAKE SURE YOU:   Understand these instructions.   Will watch your condition.   Will get help right away if you are not doing well or get worse.  Document Released: 06/14/2005 Document Revised: 06/03/2011 Document Reviewed: 01/17/2008 [facility] Patient Information 2012 [place], [place].Marland Kitchen

## 2011-09-17 NOTE — Progress Notes (Signed)
Per MD no more EKGs needed to be done, measuring QTC through monitor strip.

## 2011-09-17 NOTE — Progress Notes (Signed)
SUBJECTIVE:  Slightly dizzy today.  She has had some bradycardia.   PHYSICAL EXAM Filed Vitals:   09/16/11 1315 09/16/11 1824 09/16/11 2054 09/17/11 0545  BP: 151/60 121/62 137/74 171/64  Pulse: 59 41 58 60  Temp: 97.5 F (36.4 C)  98.2 F (36.8 C) 98.5 F (36.9 C)  TempSrc: Oral  Oral Oral  Resp: 20 18 18 16   Height:      Weight:      SpO2: 96% 96% 96% 93%   General:  No distress Lungs:  Basilar crackles unchanged Heart:  RRR tachy Abdomen:  Positive bowel sounds, no rebound no guarding Extremities:  No edema. Neuro:  Intact  LABS:  Results for orders placed during the hospital encounter of 09/12/11 (from the past 24 hour(s))  GLUCOSE, CAPILLARY     Status: Abnormal   Collection Time   09/16/11 11:35 AM      Component Value Range   Glucose-Capillary 260 (*) 70 - 99 (mg/dL)   Comment 1 Notify RN    GLUCOSE, CAPILLARY     Status: Abnormal   Collection Time   09/16/11  4:56 PM      Component Value Range   Glucose-Capillary 191 (*) 70 - 99 (mg/dL)   Comment 1 Documented in Chart     Comment 2 Notify RN    GLUCOSE, CAPILLARY     Status: Abnormal   Collection Time   09/16/11  8:51 PM      Component Value Range   Glucose-Capillary 101 (*) 70 - 99 (mg/dL)   Comment 1 Documented in Chart     Comment 2 Notify RN    GLUCOSE, CAPILLARY     Status: Abnormal   Collection Time   09/17/11  6:21 AM      Component Value Range   Glucose-Capillary 162 (*) 70 - 99 (mg/dL)   Comment 1 Notify RN    PROTIME-INR     Status: Abnormal   Collection Time   09/17/11  6:55 AM      Component Value Range   Prothrombin Time 33.0 (*) 11.6 - 15.2 (seconds)   INR 3.17 (*) 0.00 - 1.49   MAGNESIUM     Status: Normal   Collection Time   09/17/11  6:55 AM      Component Value Range   Magnesium 1.5  1.5 - 2.5 (mg/dL)    Intake/Output Summary (Last 24 hours) at 09/17/11 1610 Last data filed at 09/17/11 0600  Gross per 24 hour  Intake   1080 ml  Output   1302 ml  Net   -222 ml   Lab  Results  Component Value Date   CALCIUM 9.7 09/15/2011    ASSESSMENT AND PLAN:   1) A-fib:  Still with paroxysms of atrial arrhythmias on admission.  No further paroxysms although she has had some bradycardia.  Her QTC is tolerating the increased dose.  (427 today).  I will decrease the Cardizem and metoprolol today.  2) HYPERTENSION: This could have contributed to admission complaints. BPs are less labile.  3) CAD: No evidence of ischemia. Enzymes negative.  Of note she is on warfarin, ASA and Plavix.  I stopped the ASA.  4)  Hypomagnesemia:  She has had diarrhea thought this has been limited and is not ongoing and also could be losing because of the diuretic.  She was supplemented IV again this admission.  The level is 1.5 today. Calcium was normal.  I increased the PO dose.  I will give another IV  4g today.  I will reduce the Lasix and follow up a level in the am. I will also check an ionized calcium.  If we cannot keep the Mag up we might not be able to continue the Tikosyn.    Note if her heart rate and magnesium are OK in the am she can go home and get a magnesium level early next week at [name], MDs office.  She will need a 48 Holter after discharge and follow up with me in [place].   Alexandria [name] [facility] 09/17/2011 8:29 AM

## 2011-09-17 NOTE — Progress Notes (Signed)
ANTICOAGULATION CONSULT NOTE - Follow Up Consult  Pharmacy Consult for Coumadin Indication: atrial fibrillation  Allergies  Allergen Reactions  . Ace Inhibitors Cough    Enalapril  benicor    . Crestor (Rosuvastatin Calcium)     Fatigue and leg pain   . Lipitor (Atorvastatin Calcium)     Myalgias   . Niaspan (Niacin)     Intol   . Olmesartan Cough  . Prednisone     REACTION: "retained fluid"    Patient Measurements: Height: 5\' 4"  (162.6 cm) Weight: 157 lb 6.5 oz (71.4 kg) IBW/kg (Calculated) : 54.7   Vital Signs: Temp: 98.5 F (36.9 C) (03/22 0545) Temp src: Oral (03/22 0545) BP: 171/64 mmHg (03/22 0545) Pulse Rate: 70  (03/22 0954)  Labs:  Alexandria [name] 09/17/11 0655 09/16/11 0502 09/15/11 0515  HGB -- -- --  HCT -- -- --  PLT -- -- --  APTT -- -- --  LABPROT 33.0* 29.5* 25.8*  INR 3.17* 2.75* 2.31*  HEPARINUNFRC -- -- --  CREATININE -- -- 0.54  CKTOTAL -- -- --  CKMB -- -- --  TROPONINI -- -- --   Estimated Creatinine Clearance: 55.3 ml/min (by C-G formula based on Cr of 0.54).   Medications:  Scheduled:    . clopidogrel  75 mg Oral Daily  . digoxin  125 mcg Oral Daily  . diltiazem  240 mg Oral Daily  . dofetilide  250 mcg Oral BID  . furosemide  40 mg Oral Daily  . insulin aspart  0-15 Units Subcutaneous TID WC  . insulin aspart protamine-insulin aspart  15 Units Subcutaneous BID AC  . isosorbide mononitrate  30 mg Oral Daily  . latanoprost  1 drop Both Eyes QHS  . magnesium oxide  800 mg Oral Daily  . magnesium sulfate 1 - 4 g bolus IVPB  4 g Intravenous Once  . metFORMIN  1,000 mg Oral BID WC  . metoprolol tartrate  50 mg Oral TID  . omega-3 acid ethyl esters  1 g Oral Daily  . potassium chloride SA  40 mEq Oral BID  . warfarin  10 mg Oral Q T,Th,Sat-1800  . Warfarin - Pharmacist Dosing Inpatient   Does not apply q1800  . DISCONTD: diltiazem  300 mg Oral Daily  . DISCONTD: furosemide  40 mg Oral BID  . DISCONTD: metoprolol tartrate  50 mg  Oral QID  . DISCONTD: metoprolol tartrate  50 mg Oral QID    Assessment: 78yo female with A.Fib.  INR 3.17 this AM, has been steadily climbing on home dose of 5mg  daily, except 10mg   TTS from INR = 2.18 on admit.  No problems noted, ? compliance issues with home dosing.  Goal of Therapy:  INR 2-3   Plan:  1.  Coumadin 0.5mg  today 2.  Coumadin 5mg  daily- start 3/23 3.  Continue daily INR  Alexandria [name], PharmD Clinical Pharmacist [facility]

## 2011-09-17 NOTE — Progress Notes (Signed)
   CARE MANAGEMENT NOTE 09/17/2011  Patient:  Alexandria [name], Alexandria [name]   Account Number:  [id number]  Date Initiated:  09/15/2011  Documentation initiated by:  GRAVES-BIGELOW,Dareld Mcauliffe  Subjective/Objective Assessment:   Pt admitted  with Chest pain and palpitations. Pt was started on tikosyn prior to admission. Currently on low dose tikosyn. Pt is from home and has [facility] for RN services.     Action/Plan:   MD if pt is plan for d/c  home when stable-please write resumption orders for [facility]. Thanks   Anticipated DC Date:  09/19/2011   Anticipated DC Plan:  HOME W HOME HEALTH SERVICES      DC Planning Services  CM consult      [facility] Choice  Resumption Of Svcs/PTA Provider   Choice offered to / List presented to:  C-1 Patient        HH arranged  HH-1 RN  HH-10 DISEASE MANAGEMENT      HH agency  [facility]   Status of service:  Completed, signed off Medicare Important Message given?   (If response is "NO", the following Medicare IM given date fields will be blank) Date Medicare IM given:   Date Additional Medicare IM given:    Discharge Disposition:  HOME W HOME HEALTH SERVICES  Per UR Regulation:    If discussed at Long Length of Stay Meetings, dates discussed:    Comments:  09-17-11 [address] Tomi Bamberger, RN,BSN [phone] CM did place resumption order for [facility]. SOC to begin within 24-48 hrs post d/c.  09-15-11 1441 Tomi Bamberger, RN,BSN [phone] CM will continue to monitor for d/c disposition needs.

## 2011-09-18 LAB — BASIC METABOLIC PANEL
BUN: 11 mg/dL (ref 6–23)
Chloride: 102 mEq/L (ref 96–112)
GFR calc Af Amer: >90 mL/min (ref >90)
GFR calc non Af Amer: 87 mL/min — ABNORMAL LOW (ref >90)
Glucose, Bld: 250 mg/dL — ABNORMAL HIGH (ref 70–99)
Potassium: 4.8 mEq/L (ref 3.5–5.1)
Sodium: 136 mEq/L (ref 135–145)

## 2011-09-18 LAB — GLUCOSE, CAPILLARY
Glucose-Capillary: 248 mg/dL — ABNORMAL HIGH (ref 70–99)
Glucose-Capillary: 219 mg/dL — ABNORMAL HIGH (ref 70–99)
Glucose-Capillary: 263 mg/dL — ABNORMAL HIGH (ref 70–99)

## 2011-09-18 MED ORDER — WARFARIN SODIUM 5 MG PO TABS
5.0000 mg | ORAL_TABLET | Freq: Once | ORAL | Status: AC
  Administered 2011-09-18: 0.5 mg via ORAL
  Filled 2011-09-18: qty 1

## 2011-09-18 MED ORDER — MAGNESIUM OXIDE 400 MG PO TABS
400.0000 mg | ORAL_TABLET | Freq: Three times a day (TID) | ORAL | Status: DC
  Administered 2011-09-18 – 2011-09-19 (×3): 400 mg via ORAL
  Filled 2011-09-18 (×5): qty 1

## 2011-09-18 MED ORDER — MAGNESIUM SULFATE 40 MG/ML IJ SOLN
4.0000 g | Freq: Once | INTRAMUSCULAR | Status: AC
  Administered 2011-09-18: 4 g via INTRAVENOUS
  Filled 2011-09-18: qty 100

## 2011-09-18 NOTE — Progress Notes (Signed)
ANTICOAGULATION CONSULT NOTE - Follow Up Consult  Pharmacy Consult for : Coumadin Indication: atrial fibrillation  Allergies  Allergen Reactions  . Ace Inhibitors Cough    Enalapril  benicor    . Crestor (Rosuvastatin Calcium)     Fatigue and leg pain   . Lipitor (Atorvastatin Calcium)     Myalgias   . Niaspan (Niacin)     Intol   . Olmesartan Cough  . Prednisone     REACTION: "retained fluid"    Patient Measurements: Height: 5\' 4"  (162.6 cm) Weight: 157 lb 6.5 oz (71.4 kg) IBW/kg (Calculated) : 54.7    Vital Signs: Temp: 97.2 F (36.2 C) (03/23 1500) BP: 129/63 mmHg (03/23 1500) Pulse Rate: 68  (03/23 1500)  Labs:  Basename 09/18/11 0500 09/17/11 0655 09/16/11 0502  HGB -- -- --  HCT -- -- --  PLT -- -- --  APTT -- -- --  LABPROT 28.6* 33.0* 29.5*  INR 2.64* 3.17* 2.75*  HEPARINUNFRC -- -- --  CREATININE 0.55 -- --  CKTOTAL -- -- --  CKMB -- -- --  TROPONINI -- -- --   Estimated Creatinine Clearance: 55.3 ml/min (by C-G formula based on Cr of 0.55).   Medications:     clopidogrel 75 mg Oral Daily  digoxin 125 mcg Oral Daily  diltiazem 240 mg Oral Daily  dofetilide 250 mcg Oral BID  furosemide 40 mg Oral Daily  insulin aspart 0-15 Units Subcutaneous TID WC  insulin aspart protamine-insulin aspart 15 Units Subcutaneous BID AC  isosorbide mononitrate 30 mg Oral Daily  latanoprost 1 drop Both Eyes QHS  magnesium oxide 400 mg Oral TID  magnesium sulfate 1 - 4 g bolus IVPB 4 g Intravenous Once  metFORMIN 1,000 mg Oral BID WC  metoprolol tartrate 50 mg Oral TID  omega-3 acid ethyl esters 1 g Oral Daily  potassium chloride SA 40 mEq Oral BID  warfarin 0.5 mg Oral ONCE-1800  Warfarin - Pharmacist Dosing Inpatient  Does not apply q1800  DISCONTD: magnesium oxide 800 mg Oral Daily    Assessment: 78 y/o female with A-fib.  INR 2.64 .  At therapeutic goal.  Goal of Therapy:  INR 2-3   Plan:  Coumadin 5 mg today.  Reylynn Vanalstine, Elisha Headland,  Pharm.D. 09/18/2011 4:56 PM

## 2011-09-18 NOTE — Progress Notes (Signed)
    Subjective:   Pt has a hx of A-Fib.  Has been started on Tikosyn.  She has had trouble keeping her magnesium up      . clopidogrel  75 mg Oral Daily  . digoxin  125 mcg Oral Daily  . diltiazem  240 mg Oral Daily  . dofetilide  250 mcg Oral BID  . furosemide  40 mg Oral Daily  . insulin aspart  0-15 Units Subcutaneous TID WC  . insulin aspart protamine-insulin aspart  15 Units Subcutaneous BID AC  . isosorbide mononitrate  30 mg Oral Daily  . latanoprost  1 drop Both Eyes QHS  . magnesium oxide  800 mg Oral Daily  . magnesium sulfate 1 - 4 g bolus IVPB  4 g Intravenous Once  . metFORMIN  1,000 mg Oral BID WC  . metoprolol tartrate  50 mg Oral TID  . omega-3 acid ethyl esters  1 g Oral Daily  . potassium chloride SA  40 mEq Oral BID  . warfarin  0.5 mg Oral ONCE-1800  . Warfarin - Pharmacist Dosing Inpatient   Does not apply q1800      Objective:  Vital Signs in the last 24 hours: Blood pressure 160/60, pulse 60, temperature 98.8 F (37.1 C), temperature source Oral, resp. rate 19, height 5\' 4"  (1.626 m), weight 157 lb 6.5 oz (71.4 kg), SpO2 92.00%. Temp:  [97.8 F (36.6 C)-98.8 F (37.1 C)] 98.8 F (37.1 C) (03/23 0402) Pulse Rate:  [53-81] 60  (03/23 1030) Resp:  [18-20] 19  (03/23 0402) BP: (160-184)/(60-76) 160/60 mmHg (03/23 1030) SpO2:  [91 %-98 %] 92 % (03/23 0402)  Intake/Output from previous day: 03/22 0701 - 03/23 0700 In: 1200 [P.O.:1200] Out: 2200 [Urine:2200] Intake/Output from this shift:    Physical Exam:  Physical Exam: Blood pressure 160/60, pulse 60, temperature 98.8 F (37.1 C), temperature source Oral, resp. rate 19, height 5\' 4"  (1.626 m), weight 157 lb 6.5 oz (71.4 kg), SpO2 92.00%. General: Well developed, well nourished, in no acute distress. Head: Normocephalic, atraumatic, sclera non-icteric, mucus membranes are moist,  Neck: Supple. Normal carotids. No JVD Lungs: Clear bilaterally to auscultation without wheezes, rales, or rhonchi.  Breathing is unlabored. Heart: Regular rate,  With normal  S1 S2. No murmurs, rubs, or gallops  Abdomen: Soft, non-tender, non-distended with normoactive bowel sounds. No hepatomegaly. No rebound/guarding. No abdominal masses. Msk:  Strength and tone appear normal for age. Extremities: No clubbing or cyanosis. No edema.  Distal pedal pulses are 2+ and equal bilaterally. Neuro: Alert and oriented X 3. Moves all extremities spontaneously. Psych:  Responds to questions appropriately with a normal affect.    Lab Results:   Basename 09/18/11 0500 09/17/11 0655  NA 136 --  K 4.8 --  CL 102 --  CO2 27 --  GLUCOSE 250* --  BUN 11 --  CREATININE 0.55 --  CALCIUM 10.5 --  MG 1.7 1.5  PHOS -- --    NSR.  QT is .37   Assessment/Plan:   1. A-Fib:  Maintaining NSR.  QT is OK.  supplimenting magnesium. Will give another IV dose today and increase PO to 400 TID.  Recheck mag level tomorrow.  Needs to be above 1.8 in order to go home.  Vesta Mixer, Montez Hageman., MD, [facility] 09/18/2011, 11:41 AM LOS: Day 6

## 2011-09-19 ENCOUNTER — Encounter (HOSPITAL_COMMUNITY): Payer: Self-pay | Admitting: Physician Assistant

## 2011-09-19 DIAGNOSIS — R079 Chest pain, unspecified: Secondary | ICD-10-CM

## 2011-09-19 LAB — MAGNESIUM: Magnesium: 2.1 mg/dL (ref 1.5–2.5)

## 2011-09-19 LAB — GLUCOSE, CAPILLARY
Glucose-Capillary: 253 mg/dL — ABNORMAL HIGH (ref 70–99)
Glucose-Capillary: 126 mg/dL — ABNORMAL HIGH (ref 70–99)

## 2011-09-19 MED ORDER — METOPROLOL TARTRATE 50 MG PO TABS: 50.0000 mg | ORAL_TABLET | Freq: Three times a day (TID) | ORAL | Status: DC

## 2011-09-19 MED ORDER — DOFETILIDE 250 MCG PO CAPS: 250.0000 ug | ORAL_CAPSULE | Freq: Two times a day (BID) | ORAL | Status: DC

## 2011-09-19 MED ORDER — FUROSEMIDE 40 MG PO TABS: 40.0000 mg | ORAL_TABLET | Freq: Every day | ORAL | Status: DC

## 2011-09-19 MED ORDER — MAGNESIUM OXIDE 400 MG PO TABS: 400.0000 mg | ORAL_TABLET | Freq: Three times a day (TID) | ORAL | Status: DC

## 2011-09-19 NOTE — Progress Notes (Signed)
Subjective:   Pt has a hx of A-Fib.  Has been started on Tikosyn.  She has had trouble keeping her magnesium up.  Her mag level was low yesterday so we gave her IV mag 4 gm and increased her PO Mag supplement.  QTC is .37.    Ready to go home.      . clopidogrel  75 mg Oral Daily  . digoxin  125 mcg Oral Daily  . diltiazem  240 mg Oral Daily  . dofetilide  250 mcg Oral BID  . furosemide  40 mg Oral Daily  . insulin aspart  0-15 Units Subcutaneous TID WC  . insulin aspart protamine-insulin aspart  15 Units Subcutaneous BID AC  . isosorbide mononitrate  30 mg Oral Daily  . latanoprost  1 drop Both Eyes QHS  . magnesium oxide  400 mg Oral TID  . magnesium sulfate 1 - 4 g bolus IVPB  4 g Intravenous Once  . metFORMIN  1,000 mg Oral BID WC  . metoprolol tartrate  50 mg Oral TID  . omega-3 acid ethyl esters  1 g Oral Daily  . potassium chloride SA  40 mEq Oral BID  . warfarin  5 mg Oral ONCE-1800  . Warfarin - Pharmacist Dosing Inpatient   Does not apply q1800  . DISCONTD: magnesium oxide  800 mg Oral Daily      Objective:  Vital Signs in the last 24 hours: Blood pressure 173/71, pulse 63, temperature 97.6 F (36.4 C), temperature source Oral, resp. rate 18, height 5\' 4"  (1.626 m), weight 157 lb 6.5 oz (71.4 kg), SpO2 93.00%. Temp:  [97.2 F (36.2 C)-98.5 F (36.9 C)] 97.6 F (36.4 C) (03/24 0500) Pulse Rate:  [60-68] 63  (03/24 0500) Resp:  [18-19] 18  (03/24 0500) BP: (129-174)/(60-81) 173/71 mmHg (03/24 0500) SpO2:  [93 %-96 %] 93 % (03/24 0500)  Intake/Output from previous day: 03/23 0701 - 03/24 0700 In: 820 [P.O.:720; IV Piggyback:100] Out: 1950 [Urine:1950] Intake/Output from this shift:    Physical Exam:  Physical Exam: Blood pressure 173/71, pulse 63, temperature 97.6 F (36.4 C), temperature source Oral, resp. rate 18, height 5\' 4"  (1.626 m), weight 157 lb 6.5 oz (71.4 kg), SpO2 93.00%. General: Well developed, well nourished, in no acute  distress. Head: Normocephalic, atraumatic, sclera non-icteric, mucus membranes are moist,  Neck: Supple. Normal carotids. No JVD Lungs: Clear bilaterally to auscultation without wheezes, rales, or rhonchi. Breathing is unlabored. Heart: Regular rate,  With normal  S1 S2. No murmurs, rubs, or gallops  Abdomen: Soft, non-tender, non-distended with normoactive bowel sounds. No hepatomegaly. No rebound/guarding. No abdominal masses. Msk:  Strength and tone appear normal for age. Extremities: No clubbing or cyanosis. No edema.  Distal pedal pulses are 2+ and equal bilaterally. Neuro: Alert and oriented X 3. Moves all extremities spontaneously. Psych:  Responds to questions appropriately with a normal affect.    Lab Results:   Basename 09/19/11 0600 09/18/11 0500  NA -- 136  K -- 4.8  CL -- 102  CO2 -- 27  GLUCOSE -- 250*  BUN -- 11  CREATININE -- 0.55  CALCIUM -- 10.5  MG 2.1 1.7  PHOS -- --    NSR.  QT is .37   Assessment/Plan:   1. A-Fib:  Maintaining NSR.  QT is OK.  mag level is ok. ( 2.1)  Home today.  Follow up with Dr. Christell Constant this week for magnesium level and will also see Dr. Caprice Red  on April 10 ( already scheduled according to patient) will need ECG , BMP, and mag level at that visit.  Home on:  Mag ox 400 TID. home coumadin dose. tikosyn present dose Other current meds.  Vesta Mixer, Montez Hageman., MD, [facility] 09/19/2011, 10:01 AM LOS: Day 7

## 2011-09-19 NOTE — Progress Notes (Signed)
Pt  Very adamant on refusing bed alarms to be turned on. Discussed at great length reasons for the bed alarms. States she's "not going to be held prisoner". Pt agreed to call for assistance in ambulating. Barbera Setters

## 2011-09-19 NOTE — Discharge Summary (Signed)
Discharge Summary   Patient ID: Alexandria [name] MRN: [id number], DOB/AGE: Nov 06, 1929 78 y.o. Admit date: 09/12/2011 D/C date:     09/19/2011    Primary Care Physician:  Dr. [name] Primary Cardiologist:  Dr. [name]  Primary Electrophysiologist:  None   Reason for Admission:  Chest pain and weakness  Primary Discharge Diagnoses:   1.   Paroxysmal atrial fibrillation       - Tikosyn dose adjusted this admission 2.   Hypomagnesemia 3.   Chest Pain       - MI ruled out this admission   Secondary Discharge Diagnoses:   Past Medical History  Diagnosis Date  . Hypertension     with h/o hypertensive urgency  . Glaucoma   . Tremor, essential   . Diabetes mellitus with neuropathy   . B12 deficiency   . Hyperlipidemia   . Atrial fibrillation     a. on chronic coumadin;  b. Tiksosyn adjusted to 250 mcg bid 08/2011 admission  . Chronic diastolic heart failure     echo 05/2009: mild LVH, EF 55-60%, grade 2 diast dysfxn, mild LAE, PASP  . CAD (coronary artery disease)     a.  LHC 06/2009: pLAD 40%, prox-mid D1 80% (small);  CFX 40-50%, mRCA 20%, EF 65%  -  med Rx;  b.  05/23/11 Myoview - EF 53%, no isch/inf;  c. 07/2011 NSTEMI - PCI/DES LCX - Resolute stent  . Carotid stenosis     dopplers 6/12: 1-39% bilat ICA  . Pulmonary fibrosis     Dr. Vassie Loll;  patient taken off O2 in 8/12; dyspnea felt CHF>>ILD  . Skin cancer   . Shortness of breath   . Atrial fibrillation   . A-fib 08/31/2011  . Warfarin anticoagulation   . Hypomagnesemia      Procedures Performed This Admission:    2D Echocardiogram 09/15/11: Study Conclusions  - Left ventricle: The cavity size was normal. Wall thickness was increased in a pattern of moderate LVH. Systolic function was normal. The estimated ejection fraction was in the range of 55% to 60%. - Aortic valve: Mildly to moderately calcified annulus. There was very mild stenosis. Valve area: 1.77cm^2(VTI). Valve area: 1.63cm^2 (Vmax). - Atrial  septum: No defect or patent foramen ovale was identified.   Hospital Course: Alexandria [name] is a 78 y.o. female who has a history of paroxysmal atrial fibrillation, chronic Coumadin therapy, CAD, status post DES to the circumflex and 2/13 in the setting of NSTEMI, chronic diastolic heart failure, diabetes, hypertension, hyperlipidemia, carotid stenosis, pulmonary fibrosis. She was admitted earlier this month with atrial fibrillation. She was placed on Tikosyn.  She returned to the hospital on 09/12/11 with complaints of generalized weakness since she left the hospital. She also complained of fairly brief chest pain, palpitations and weakness. She fell to the floor on the morning of admission. She denied frank syncope.  She had brief episodes of what appeared to be atrial flutter in the emergency room.  She was admitted for further evaluation and treatment.  She ruled out for myocardial infarction. She continued to have paroxysms of atrial arrhythmias. Electrophysiology was asked to see the patient.  Dr. Johney Frame saw the patient. She had apparently not tolerated higher doses of Tikosyn in the past.  His initial thought was to try increasing her Tikosyn back to 250 mcg twice daily.  She was monitored over the next several days to make sure that her QTC did not prolong period she did develop some bradycardia  and her calcium channel blocker and beta blocker were both adjusted. She had some difficulty with hypomagnesemia in the setting of diarrhea.  She received several doses of IV magnesium.  She was evaluated by Dr. Delane Ginger on the day of discharge and felt to be ready for d/c to home.  She will need a follow up Magnesium level with Dr. Christell Constant this week (in next 48 hours).  She will also be set up for a 48 hour Holter.  She will keep her follow up with Dr. [name] in [place] on 10/06/11.  Discharge Vitals: Blood pressure 173/71, pulse 63, temperature 97.6 F (36.4 C), temperature source Oral, resp. rate 18,  height 5\' 4"  (1.626 m), weight 157 lb 6.5 oz (71.4 kg), SpO2 93.00%.  Labs: Lab Results  Component Value Date   WBC 11.3* 09/12/2011   HGB 13.6 09/12/2011   HCT 40.0 09/12/2011   MCV 82.7 09/12/2011   PLT 346 09/12/2011     Lab 09/18/11 0500  NA 136  K 4.8  CL 102  CO2 27  BUN 11  CREATININE 0.55  CALCIUM 10.5  PROT --  BILITOT --  ALKPHOS --  ALT --  AST --  GLUCOSE 250*   ]  Lab Results  Component Value Date   TSH 2.594 08/31/2011    Magnesium  Date/Time Value Range Status  09/19/2011  6:00 AM 2.1  1.5-2.5 (mg/dL) Final  6/64/4034  7:42 AM 1.7  1.5-2.5 (mg/dL) Final  5/95/6387  5:64 AM 1.5  1.5-2.5 (mg/dL) Final  3/32/9518  8:41 AM 1.9  1.5-2.5 (mg/dL) Final   Diagnostic Studies/Procedures:   Dg Chest Port 1 View  09/12/2011   IMPRESSION: No acute cardiopulmonary process.  Original Report Authenticated By: Genevive Bi, M.D.    Discharge Medications   Medication List  As of 09/19/2011 12:16 PM   STOP taking these medications         amoxicillin 500 MG tablet      magnesium gluconate 500 MG tablet         TAKE these medications         clopidogrel 75 MG tablet   Commonly known as: PLAVIX   Take 75 mg by mouth daily.      colesevelam 625 MG tablet   Commonly known as: WELCHOL   Take 1,250 mg by mouth daily.      digoxin 0.125 MG tablet   Commonly known as: LANOXIN   Take 125 mcg by mouth daily.      diltiazem 240 MG 24 hr capsule   Commonly known as: CARDIZEM CD   Take 240 mg by mouth daily.      dofetilide 250 MCG capsule   Commonly known as: TIKOSYN   Take 1 capsule (250 mcg total) by mouth 2 (two) times daily.      fish oil-omega-3 fatty acids 1000 MG capsule   Take 1 g by mouth daily.      furosemide 40 MG tablet   Commonly known as: LASIX   Take 1 tablet (40 mg total) by mouth daily.      glimepiride 4 MG tablet   Commonly known as: AMARYL   Take 4 mg by mouth Twice daily.      isosorbide mononitrate 30 MG 24 hr tablet    Commonly known as: IMDUR   Take 30 mg by mouth daily.      latanoprost 0.005 % ophthalmic solution   Commonly known as: XALATAN   Place 1  drop into both eyes at bedtime.      magnesium oxide 400 MG tablet   Commonly known as: MAG-OX   Take 1 tablet (400 mg total) by mouth 3 (three) times daily.      metFORMIN 1000 MG tablet   Commonly known as: GLUCOPHAGE   Take 1,000 mg by mouth 2 (two) times daily with a meal.      metoprolol 50 MG tablet   Commonly known as: LOPRESSOR   Take 1 tablet (50 mg total) by mouth 3 (three) times daily.      nitroGLYCERIN 0.4 MG SL tablet   Commonly known as: NITROSTAT   Place 0.4 mg under the tongue every 5 (five) minutes as needed. As needed for chest pain      NOVOLOG MIX 70/30 FLEXPEN (70-30) 100 UNIT/ML injection   Generic drug: insulin aspart protamine-insulin aspart   Inject 20-34 Units into the skin Twice daily. 34 units in the morning & 20 units in the evening      potassium chloride SA 20 MEQ tablet   Commonly known as: K-DUR,KLOR-CON   Take 40 mEq by mouth 2 (two) times daily.      VITAMIN B-12 IJ   Inject 1 application as directed every 30 (thirty) days. Given at the doctor's office at the end of each month      Vitamin D 1000 UNITS capsule   Take 1,000 Units by mouth daily. OTC        warfarin 5 MG tablet   Commonly known as: COUMADIN   Take 5-10 mg by mouth daily. Alternates days and takes 10 mg on Tuesdays, Thursdays & Saturday.  & 5 mg other days of the week.            Disposition   The patient will be discharged in stable condition to home. Discharge Orders    Future Appointments: Provider: Department: Dept Phone: Center:   09/23/2011 9:00 AM Sherrie George, MD [facility] [phone] None   10/06/2011 12:00 PM [name], MD [facility] [phone] [facility]     Future Orders Please Complete By Expires   Diet - low sodium heart healthy      Diet Carb Modified      Increase activity slowly       No wound care        Follow-up Information    Follow up with [name], MD in 2 days. (You need to have a Magnesium level drawn  -  please call Monday and have drawn on Monday or Tuesday)       Follow up with [name], MD on 10/06/2011. (12:00 PM)    Contact information:   [address] [place] [address] [phone]       Follow up with [facility]. (48 Hour Holter;  We are arranging.)    Contact information:   [address] [place] [address] [phone]           Duration of Discharge Encounter: Greater than 30 minutes including physician and PA time.  Signed, Tereso Newcomer, PA-C  12:16 PM 09/19/2011        [address] [place], [place]  [address] Phone: [phone] Fax:  [phone]   Attending Note:   The patient was seen and examined.  Agree with assessment and plan as noted above.  See my note from earlier today.  Vesta Mixer, Montez Hageman., MD, [facility]  09/19/2011, 12:25 PM

## 2011-09-21 ENCOUNTER — Telehealth: Payer: Self-pay

## 2011-09-21 ENCOUNTER — Encounter: Payer: Self-pay | Admitting: Cardiology

## 2011-09-21 NOTE — Telephone Encounter (Signed)
Call patient for monitor appointment and she would like to have monitor done in [place].

## 2011-09-23 ENCOUNTER — Encounter (INDEPENDENT_AMBULATORY_CARE_PROVIDER_SITE_OTHER): Payer: Medicare Other | Admitting: Ophthalmology

## 2011-09-25 ENCOUNTER — Other Ambulatory Visit: Payer: Self-pay | Admitting: Cardiology

## 2011-09-27 NOTE — Telephone Encounter (Signed)
..   Requested Prescriptions   Pending Prescriptions Disp Refills  . isosorbide mononitrate (IMDUR) 30 MG 24 hr tablet [Pharmacy Med Name: ISOSORB MONO 30MG  E TAB QUAL] 30 tablet 2    Sig: TAKE ONE TABLET BY MOUTH ONE TIME DAILY

## 2011-09-28 ENCOUNTER — Encounter: Payer: Self-pay | Admitting: Cardiology

## 2011-10-06 ENCOUNTER — Encounter: Payer: Self-pay | Admitting: Cardiology

## 2011-10-06 ENCOUNTER — Ambulatory Visit (INDEPENDENT_AMBULATORY_CARE_PROVIDER_SITE_OTHER): Payer: Medicare Other | Admitting: Cardiology

## 2011-10-06 VITALS — BP 125/60 | HR 52 | Ht 64.0 in | Wt 157.0 lb

## 2011-10-06 DIAGNOSIS — I4891 Unspecified atrial fibrillation: Secondary | ICD-10-CM

## 2011-10-06 DIAGNOSIS — I2510 Atherosclerotic heart disease of native coronary artery without angina pectoris: Secondary | ICD-10-CM

## 2011-10-06 DIAGNOSIS — I10 Essential (primary) hypertension: Secondary | ICD-10-CM

## 2011-10-06 NOTE — Progress Notes (Signed)
HPI Patient presents for followup after recent hospitalization for atrial fibrillation with a rapid ventricular rate. She was started on Tikosyn but had recurrent fibrillation. I did have her see electrophysiology as she was having tachybradycardia symptomatic fibrillation. However, it was elected to continue conservative therapy for the slightly higher dose of the distance.   She did have problems with low magnesium levels but these were corrected and had been followed as an outpatient. Today I able to review a Holter monitor which demonstrates sinus rhythm. There are some premature ectopic complexes but there is no fibrillation. There is no bradycardia or sustained.  Since discharge she has had no new problems. She denies any tachycardia palpitations. She has had no presyncope or syncope. She denies any chest pressure, neck discomfort. She has been weak however.  Allergies  Allergen Reactions  . Ace Inhibitors Cough    Enalapril  benicor    . Crestor (Rosuvastatin Calcium)     Fatigue and leg pain   . Lipitor (Atorvastatin Calcium)     Myalgias   . Niaspan (Niacin)     Intol   . Olmesartan Cough  . Prednisone     REACTION: "retained fluid"    Current Outpatient Prescriptions  Medication Sig Dispense Refill  . clopidogrel (PLAVIX) 75 MG tablet Take 75 mg by mouth daily.      . colesevelam (WELCHOL) 625 MG tablet Take 1,250 mg by mouth daily.       . Cyanocobalamin (VITAMIN B-12 IJ) Inject 1 application as directed every 30 (thirty) days. Given at the doctor's office at the end of each month      . digoxin (LANOXIN) 0.125 MG tablet Take 125 mcg by mouth daily.      Marland Kitchen diltiazem (CARDIZEM CD) 240 MG 24 hr capsule Take 240 mg by mouth daily.      Marland Kitchen dofetilide (TIKOSYN) 250 MCG capsule Take 1 capsule (250 mcg total) by mouth 2 (two) times daily.      . fish oil-omega-3 fatty acids 1000 MG capsule Take 1 g by mouth daily.      . furosemide (LASIX) 40 MG tablet Take 1 tablet (40 mg  total) by mouth daily.  30 tablet  11  . glimepiride (AMARYL) 4 MG tablet Take 4 mg by mouth Twice daily.       . isosorbide mononitrate (IMDUR) 30 MG 24 hr tablet Take 30 mg by mouth daily.      . magnesium oxide (MAG-OX) 400 MG tablet Take 1 tablet (400 mg total) by mouth 3 (three) times daily.  90 tablet  11  . metFORMIN (GLUCOPHAGE) 1000 MG tablet Take 1,000 mg by mouth 2 (two) times daily with a meal.      . metoprolol (LOPRESSOR) 50 MG tablet Take 1 tablet (50 mg total) by mouth 3 (three) times daily.  90 tablet  11  . nitroGLYCERIN (NITROSTAT) 0.4 MG SL tablet Place 0.4 mg under the tongue every 5 (five) minutes as needed. As needed for chest pain      . NOVOLOG MIX 70/30 FLEXPEN (70-30) 100 UNIT/ML injection Inject 20-34 Units into the skin Twice daily. 34 units in the morning & 20 units in the evening      . potassium chloride SA (K-DUR,KLOR-CON) 20 MEQ tablet Take 40 mEq by mouth 2 (two) times daily.      Marland Kitchen warfarin (COUMADIN) 5 MG tablet Take 5-10 mg by mouth daily. Alternates days and takes 10 mg on Tuesdays, Thursdays &  Saturday.  & 5 mg other days of the week.      . Cholecalciferol (VITAMIN D) 1000 UNITS capsule Take 1,000 Units by mouth daily. OTC        . latanoprost (XALATAN) 0.005 % ophthalmic solution Place 1 drop into both eyes at bedtime.       Marland Kitchen DISCONTD: diltiazem (TIAZAC) 420 MG 24 hr capsule Take 420 mg by mouth daily.      Marland Kitchen DISCONTD: isosorbide mononitrate (IMDUR) 30 MG 24 hr tablet TAKE ONE TABLET BY MOUTH ONE TIME DAILY  30 tablet  2    Past Medical History  Diagnosis Date  . Hypertension     with h/o hypertensive urgency  . Glaucoma   . Tremor, essential   . Diabetes mellitus with neuropathy   . B12 deficiency   . Hyperlipidemia   . Atrial fibrillation     a. on chronic coumadin;  b. Tiksosyn adjusted to 250 mcg bid 08/2011 admission  . Chronic diastolic heart failure     echo 05/2009: mild LVH, EF 55-60%, grade 2 diast dysfxn, mild LAE, PASP  . CAD  (coronary artery disease)     a.  LHC 06/2009: pLAD 40%, prox-mid D1 80% (small);  CFX 40-50%, mRCA 20%, EF 65%  -  med Rx;  b.  05/23/11 Myoview - EF 53%, no isch/inf;  c. 07/2011 NSTEMI - PCI/DES LCX - Resolute stent  . Carotid stenosis     dopplers 6/12: 1-39% bilat ICA  . Pulmonary fibrosis     Dr. Vassie Loll;  patient taken off O2 in 8/12; dyspnea felt CHF>>ILD  . Skin cancer   . Shortness of breath   . Atrial fibrillation   . A-fib 08/31/2011  . Warfarin anticoagulation   . Hypomagnesemia     Past Surgical History  Procedure Date  . Appendectomy   . Nose surgery   . Cardiac catheterization     ROS:  As stated in the HPI and negative for all other systems.  PHYSICAL EXAM BP 125/60  Pulse 52  Ht 5\' 4"  (1.626 m)  Wt 157 lb (71.215 kg)  BMI 26.95 kg/m2 GENERAL:  Well appearing HEENT:  Pupils equal round and reactive, fundi not visualized, oral mucosa unremarkable NECK:  No jugular venous distention, waveform within normal limits, carotid upstroke brisk and symmetric, no bruits, no thyromegaly LYMPHATICS:  No cervical, inguinal adenopathy LUNGS:  Clear to auscultation bilaterally BACK:  No CVA tenderness CHEST:  Unremarkable HEART:  PMI not displaced or sustained,S1 and S2 within normal limits, no S3, no S4, no clicks, no rubs, no murmurs ABD:  Flat, positive bowel sounds normal in frequency in pitch, no bruits, no rebound, no guarding, no midline pulsatile mass, no hepatomegaly, no splenomegaly EXT:  2 plus pulses throughout, no edema, no cyanosis no clubbing SKIN:  No rashes no nodules NEURO:  Cranial nerves II through XII grossly intact, motor grossly intact throughout PSYCH:  Cognitively intact, oriented to person place and time, resting tremor.   ASSESSMENT AND PLAN

## 2011-10-06 NOTE — Assessment & Plan Note (Signed)
She seems to be tolerating Tikosyn and is in sinus rhythm.  She will remain on the current medications.  I will check a Mg level again today.  I reviewed and read the Holter today.

## 2011-10-06 NOTE — Patient Instructions (Signed)
The current medical regimen is effective;  continue present plan and medications.  Please have blood work today.  See Dr Antoine Poche in 6 weeks

## 2011-10-06 NOTE — Assessment & Plan Note (Signed)
The patient has no new sypmtoms.  No further cardiovascular testing is indicated.  We will continue with aggressive risk reduction and meds as listed.  Because she had a drug-eluting stent in February she needs to continue on the Plavix although the aspirin has been discontinued.

## 2011-10-06 NOTE — Assessment & Plan Note (Signed)
The blood pressure is at target. No change in medications is indicated. We will continue with therapeutic lifestyle changes (TLC).  

## 2011-10-06 NOTE — Assessment & Plan Note (Signed)
I will check this today.

## 2011-10-10 ENCOUNTER — Emergency Department (HOSPITAL_COMMUNITY): Payer: Medicare Other

## 2011-10-10 ENCOUNTER — Other Ambulatory Visit: Payer: Self-pay

## 2011-10-10 ENCOUNTER — Telehealth: Payer: Self-pay | Admitting: Physician Assistant

## 2011-10-10 ENCOUNTER — Inpatient Hospital Stay (HOSPITAL_COMMUNITY)
Admission: EM | Admit: 2011-10-10 | Discharge: 2011-10-15 | DRG: 309 | Disposition: A | Discharge status: Home / Self Care | Payer: Medicare Other | Source: Ambulatory Visit | Attending: Cardiology | Admitting: Cardiology

## 2011-10-10 ENCOUNTER — Encounter (HOSPITAL_COMMUNITY): Payer: Self-pay | Admitting: Physician Assistant

## 2011-10-10 DIAGNOSIS — I495 Sick sinus syndrome: Secondary | ICD-10-CM

## 2011-10-10 DIAGNOSIS — Z888 Allergy status to other drugs, medicaments and biological substances status: Secondary | ICD-10-CM

## 2011-10-10 DIAGNOSIS — Z79899 Other long term (current) drug therapy: Secondary | ICD-10-CM

## 2011-10-10 DIAGNOSIS — G252 Other specified forms of tremor: Secondary | ICD-10-CM | POA: Diagnosis present

## 2011-10-10 DIAGNOSIS — I498 Other specified cardiac arrhythmias: Secondary | ICD-10-CM

## 2011-10-10 DIAGNOSIS — H409 Unspecified glaucoma: Secondary | ICD-10-CM | POA: Diagnosis present

## 2011-10-10 DIAGNOSIS — N390 Urinary tract infection, site not specified: Secondary | ICD-10-CM | POA: Diagnosis present

## 2011-10-10 DIAGNOSIS — E1149 Type 2 diabetes mellitus with other diabetic neurological complication: Secondary | ICD-10-CM | POA: Diagnosis present

## 2011-10-10 DIAGNOSIS — E1142 Type 2 diabetes mellitus with diabetic polyneuropathy: Secondary | ICD-10-CM | POA: Diagnosis present

## 2011-10-10 DIAGNOSIS — E875 Hyperkalemia: Secondary | ICD-10-CM

## 2011-10-10 DIAGNOSIS — I5032 Chronic diastolic (congestive) heart failure: Secondary | ICD-10-CM | POA: Diagnosis present

## 2011-10-10 DIAGNOSIS — I10 Essential (primary) hypertension: Secondary | ICD-10-CM | POA: Diagnosis present

## 2011-10-10 DIAGNOSIS — I6529 Occlusion and stenosis of unspecified carotid artery: Secondary | ICD-10-CM | POA: Diagnosis present

## 2011-10-10 DIAGNOSIS — Z85828 Personal history of other malignant neoplasm of skin: Secondary | ICD-10-CM

## 2011-10-10 DIAGNOSIS — E785 Hyperlipidemia, unspecified: Secondary | ICD-10-CM | POA: Diagnosis present

## 2011-10-10 DIAGNOSIS — I4891 Unspecified atrial fibrillation: Principal | ICD-10-CM

## 2011-10-10 DIAGNOSIS — Z9861 Coronary angioplasty status: Secondary | ICD-10-CM

## 2011-10-10 DIAGNOSIS — I658 Occlusion and stenosis of other precerebral arteries: Secondary | ICD-10-CM | POA: Diagnosis present

## 2011-10-10 DIAGNOSIS — I509 Heart failure, unspecified: Secondary | ICD-10-CM

## 2011-10-10 DIAGNOSIS — I442 Atrioventricular block, complete: Secondary | ICD-10-CM

## 2011-10-10 DIAGNOSIS — Z7902 Long term (current) use of antithrombotics/antiplatelets: Secondary | ICD-10-CM

## 2011-10-10 DIAGNOSIS — I2510 Atherosclerotic heart disease of native coronary artery without angina pectoris: Secondary | ICD-10-CM | POA: Diagnosis present

## 2011-10-10 DIAGNOSIS — G250 Essential tremor: Secondary | ICD-10-CM | POA: Diagnosis present

## 2011-10-10 DIAGNOSIS — Z794 Long term (current) use of insulin: Secondary | ICD-10-CM

## 2011-10-10 DIAGNOSIS — Z7901 Long term (current) use of anticoagulants: Secondary | ICD-10-CM

## 2011-10-10 DIAGNOSIS — I252 Old myocardial infarction: Secondary | ICD-10-CM

## 2011-10-10 LAB — MAGNESIUM: Magnesium: 1.7 mg/dL (ref 1.5–2.5)

## 2011-10-10 LAB — CBC
HCT: 37.9 % (ref 36.0–46.0)
HCT: 37.7 % (ref 36.0–46.0)
Hemoglobin: 12.4 g/dL (ref 12.0–15.0)
MCV: 85.9 fL (ref 78.0–100.0)
RBC: 4.33 MIL/uL (ref 3.87–5.11)
RDW: 14.4 % (ref 11.5–15.5)
WBC: 13 10*3/uL — ABNORMAL HIGH (ref 4.0–10.5)
WBC: 9 10*3/uL (ref 4.0–10.5)

## 2011-10-10 LAB — COMPREHENSIVE METABOLIC PANEL
Albumin: 3.4 g/dL — ABNORMAL LOW (ref 3.5–5.2)
Alkaline Phosphatase: 88 U/L (ref 39–117)
BUN: 15 mg/dL (ref 6–23)
Chloride: 103 mEq/L (ref 96–112)
Glucose, Bld: 201 mg/dL — ABNORMAL HIGH (ref 70–99)
Potassium: 4.7 mEq/L (ref 3.5–5.1)
Total Bilirubin: 0.6 mg/dL (ref 0.3–1.2)

## 2011-10-10 LAB — BASIC METABOLIC PANEL
BUN: 17 mg/dL (ref 6–23)
CO2: 22 mEq/L (ref 19–32)
Calcium: 10.5 mg/dL (ref 8.4–10.5)
Chloride: 104 mEq/L (ref 96–112)
Creatinine, Ser: 0.76 mg/dL (ref 0.50–1.10)
GFR calc Af Amer: >90 mL/min (ref >90)
GFR calc non Af Amer: 81 mL/min — ABNORMAL LOW (ref >90)
Glucose, Bld: 292 mg/dL — ABNORMAL HIGH (ref 70–99)
Potassium: 4.8 mEq/L (ref 3.5–5.1)
Sodium: 137 mEq/L (ref 135–145)

## 2011-10-10 LAB — GLUCOSE, CAPILLARY: Glucose-Capillary: 95 mg/dL (ref 70–99)

## 2011-10-10 LAB — DIFFERENTIAL
Basophils Absolute: 0 10*3/uL (ref 0.0–0.1)
Basophils Absolute: 0 10*3/uL (ref 0.0–0.1)
Eosinophils Relative: 2 % (ref 0–5)
Lymphocytes Relative: 22 % (ref 12–46)
Lymphocytes Relative: 13 % (ref 12–46)
Monocytes Absolute: 0.8 10*3/uL (ref 0.1–1.0)
Monocytes Absolute: 0.8 10*3/uL (ref 0.1–1.0)
Monocytes Relative: 8 % (ref 3–12)
Neutro Abs: 7 10*3/uL (ref 1.7–7.7)

## 2011-10-10 LAB — CARDIAC PANEL(CRET KIN+CKTOT+MB+TROPI)
CK, MB: 2.5 ng/mL (ref 0.3–4.0)
CK, MB: 2.8 ng/mL (ref 0.3–4.0)
Relative Index: INVALID (ref 0.0–2.5)
Relative Index: INVALID (ref 0.0–2.5)
Total CK: 23 U/L (ref 7–177)
Total CK: 30 U/L (ref 7–177)
Troponin I: <0.3 ng/mL (ref <0.30)

## 2011-10-10 LAB — POCT I-STAT, CHEM 8
Hemoglobin: 13.3 g/dL (ref 12.0–15.0)
Sodium: 136 mEq/L (ref 135–145)
TCO2: 23 mmol/L (ref 0–100)

## 2011-10-10 LAB — POCT I-STAT TROPONIN I: Troponin i, poc: 0 ng/mL (ref 0.00–0.08)

## 2011-10-10 LAB — DIGOXIN LEVEL: Digoxin Level: 1.3 ng/mL (ref 0.8–2.0)

## 2011-10-10 MED ORDER — MAGNESIUM OXIDE 400 MG PO TABS
400.0000 mg | ORAL_TABLET | Freq: Three times a day (TID) | ORAL | Status: DC
  Administered 2011-10-10 – 2011-10-15 (×16): 400 mg via ORAL
  Filled 2011-10-10 (×18): qty 1

## 2011-10-10 MED ORDER — INSULIN ASPART 100 UNIT/ML ~~LOC~~ SOLN
10.0000 [IU] | Freq: Once | SUBCUTANEOUS | Status: AC
  Administered 2011-10-10: 10 [IU] via INTRAVENOUS
  Filled 2011-10-10: qty 1

## 2011-10-10 MED ORDER — WARFARIN - PHARMACIST DOSING INPATIENT: Freq: Every day | Status: DC

## 2011-10-10 MED ORDER — GLIMEPIRIDE 4 MG PO TABS
4.0000 mg | ORAL_TABLET | Freq: Two times a day (BID) | ORAL | Status: DC
  Administered 2011-10-10 – 2011-10-15 (×11): 4 mg via ORAL
  Filled 2011-10-10 (×11): qty 1

## 2011-10-10 MED ORDER — ONDANSETRON HCL 4 MG/2ML IJ SOLN: 4.0000 mg | Freq: Four times a day (QID) | INTRAMUSCULAR | Status: DC | PRN

## 2011-10-10 MED ORDER — INSULIN ASPART PROT & ASPART (70-30 MIX) 100 UNIT/ML ~~LOC~~ SUSP
20.0000 [IU] | Freq: Every day | SUBCUTANEOUS | Status: DC
  Administered 2011-10-10 – 2011-10-15 (×6): 20 [IU] via SUBCUTANEOUS

## 2011-10-10 MED ORDER — CALCIUM GLUCONATE 10 % IV SOLN
1.0000 g | Freq: Once | INTRAVENOUS | Status: DC
  Filled 2011-10-10: qty 10

## 2011-10-10 MED ORDER — VITAMIN D 1000 UNITS PO CAPS: 1000.0000 [IU] | ORAL_CAPSULE | Freq: Every day | ORAL | Status: DC

## 2011-10-10 MED ORDER — CLOPIDOGREL BISULFATE 75 MG PO TABS
75.0000 mg | ORAL_TABLET | Freq: Every day | ORAL | Status: DC
  Administered 2011-10-10 – 2011-10-15 (×6): 75 mg via ORAL
  Filled 2011-10-10 (×6): qty 1

## 2011-10-10 MED ORDER — VITAMIN D3 25 MCG (1000 UNIT) PO TABS
1000.0000 [IU] | ORAL_TABLET | Freq: Every day | ORAL | Status: DC
  Administered 2011-10-11 – 2011-10-15 (×5): 1000 [IU] via ORAL
  Filled 2011-10-10 (×5): qty 1

## 2011-10-10 MED ORDER — MIDAZOLAM HCL 2 MG/2ML IJ SOLN
INTRAMUSCULAR | Status: AC
  Administered 2011-10-10: 2 mg
  Filled 2011-10-10: qty 2

## 2011-10-10 MED ORDER — WARFARIN SODIUM 5 MG PO TABS: 5.0000 mg | ORAL_TABLET | Freq: Every day | ORAL | Status: DC

## 2011-10-10 MED ORDER — WARFARIN SODIUM 5 MG PO TABS
5.0000 mg | ORAL_TABLET | Freq: Once | ORAL | Status: DC
  Filled 2011-10-10: qty 1

## 2011-10-10 MED ORDER — FENTANYL CITRATE 0.05 MG/ML IJ SOLN
INTRAMUSCULAR | Status: AC
  Administered 2011-10-10: 25 ug
  Filled 2011-10-10: qty 2

## 2011-10-10 MED ORDER — INSULIN ASPART PROT & ASPART (70-30 MIX) 100 UNIT/ML ~~LOC~~ SUSP
34.0000 [IU] | Freq: Every day | SUBCUTANEOUS | Status: DC
  Administered 2011-10-11 – 2011-10-15 (×5): 34 [IU] via SUBCUTANEOUS
  Filled 2011-10-10: qty 3

## 2011-10-10 MED ORDER — DEXTROSE 50 % IV SOLN
1.0000 | Freq: Once | INTRAVENOUS | Status: AC
  Administered 2011-10-10: 50 mL via INTRAVENOUS
  Filled 2011-10-10: qty 50

## 2011-10-10 MED ORDER — ASPIRIN 81 MG PO CHEW: 324.0000 mg | CHEWABLE_TABLET | Freq: Once | ORAL | Status: DC

## 2011-10-10 MED ORDER — SODIUM CHLORIDE 0.9 % IV SOLN: INTRAVENOUS | Status: AC

## 2011-10-10 MED ORDER — WARFARIN - PHYSICIAN DOSING INPATIENT: Freq: Every day | Status: DC

## 2011-10-10 MED ORDER — SODIUM BICARBONATE 8.4 % IV SOLN
25.0000 meq | Freq: Once | INTRAVENOUS | Status: AC
  Administered 2011-10-10: 25 meq via INTRAVENOUS
  Filled 2011-10-10: qty 50

## 2011-10-10 MED ORDER — ACETAMINOPHEN 325 MG PO TABS: 650.0000 mg | ORAL_TABLET | ORAL | Status: DC | PRN

## 2011-10-10 MED ORDER — NITROGLYCERIN 0.4 MG SL SUBL: 0.4000 mg | SUBLINGUAL_TABLET | SUBLINGUAL | Status: DC | PRN

## 2011-10-10 MED ORDER — ISOSORBIDE MONONITRATE ER 30 MG PO TB24
30.0000 mg | ORAL_TABLET | Freq: Every day | ORAL | Status: DC
  Administered 2011-10-11 – 2011-10-15 (×5): 30 mg via ORAL
  Filled 2011-10-10 (×5): qty 1

## 2011-10-10 MED ORDER — SODIUM CHLORIDE 0.9 % IV BOLUS (SEPSIS)
1000.0000 mL | Freq: Once | INTRAVENOUS | Status: AC
  Administered 2011-10-10: 1000 mL via INTRAVENOUS

## 2011-10-10 MED ORDER — WARFARIN SODIUM 10 MG PO TABS
10.0000 mg | ORAL_TABLET | ORAL | Status: DC
  Administered 2011-10-12 – 2011-10-14 (×2): 10 mg via ORAL
  Filled 2011-10-10 (×2): qty 1

## 2011-10-10 MED ORDER — SODIUM POLYSTYRENE SULFONATE 15 GM/60ML PO SUSP
30.0000 g | Freq: Once | ORAL | Status: AC
  Administered 2011-10-10: 30 g via ORAL
  Filled 2011-10-10: qty 120

## 2011-10-10 MED ORDER — WARFARIN SODIUM 5 MG PO TABS
5.0000 mg | ORAL_TABLET | ORAL | Status: DC
  Administered 2011-10-10 – 2011-10-13 (×3): 5 mg via ORAL
  Filled 2011-10-10 (×4): qty 1

## 2011-10-10 MED ORDER — INSULIN ASPART 100 UNIT/ML ~~LOC~~ SOLN
0.0000 [IU] | Freq: Three times a day (TID) | SUBCUTANEOUS | Status: DC
  Administered 2011-10-10: 7 [IU] via SUBCUTANEOUS
  Administered 2011-10-11 (×3): 3 [IU] via SUBCUTANEOUS
  Administered 2011-10-12: 4 [IU] via SUBCUTANEOUS
  Administered 2011-10-12: 3 [IU] via SUBCUTANEOUS
  Administered 2011-10-13 – 2011-10-15 (×2): 4 [IU] via SUBCUTANEOUS

## 2011-10-10 MED ORDER — COLESEVELAM HCL 625 MG PO TABS
1250.0000 mg | ORAL_TABLET | Freq: Every day | ORAL | Status: DC
  Administered 2011-10-11 – 2011-10-15 (×5): 1250 mg via ORAL
  Filled 2011-10-10 (×5): qty 2

## 2011-10-10 MED ORDER — CALCIUM CHLORIDE 10 % IV SOLN
1.0000 g | Freq: Once | INTRAVENOUS | Status: AC
  Administered 2011-10-10: 1 g via INTRAVENOUS

## 2011-10-10 MED ORDER — LATANOPROST 0.005 % OP SOLN
1.0000 [drp] | Freq: Every day | OPHTHALMIC | Status: DC
  Administered 2011-10-11 – 2011-10-14 (×4): 1 [drp] via OPHTHALMIC
  Filled 2011-10-10 (×2): qty 2.5

## 2011-10-10 MED ORDER — SODIUM CHLORIDE 0.9 % IV SOLN
INTRAVENOUS | Status: DC
  Administered 2011-10-10 – 2011-10-12 (×5): via INTRAVENOUS
  Administered 2011-10-12: 125 mL/h via INTRAVENOUS

## 2011-10-10 NOTE — Telephone Encounter (Signed)
Patient's son called.  Patient started feeling weak 2 hours ago.  He is a paramedic.  Pulse is palpated in the 40s.  No chest pain, dyspnea, syncope.  No recent fevers, chills, cough, nausea, vomiting.  He feels more comfortable having her evaluated today.  The patient wants to come to [facility].  They prefer to call an ambulance.  She will come to [facility] ED today. Tereso Newcomer, PA-C  11:55 AM 10/10/2011

## 2011-10-10 NOTE — ED Notes (Signed)
Pt taken to flouro room in radiology for pacer insertion with dr bensimohn

## 2011-10-10 NOTE — ED Notes (Addendum)
Pt placed on external pacemaker, 80mA at the time, capturing properly. Remains on cardiac monitor. EDP and Dr. Riley Kill remains at the bedside.

## 2011-10-10 NOTE — Progress Notes (Signed)
10/10/11 1437  Discharge Planning  Type of Residence Private residence  Living Arrangements Alone  Home Care Services No  Support Systems Other relatives  Do you have any problems obtaining your medications? No  Once you are discharged, how will you get to your follow-up appointment? Self;Family  Expected Discharge Date 10/13/11  Case Management Consult Needed Yes (Comment)  Social Work Consult Needed No     Dionne Milo MSW [facility] Emergency Dept. Weekend/Social Worker [phone]

## 2011-10-10 NOTE — Progress Notes (Signed)
10/10/11 1438  OTHER  CSW Follow Up Status No follow-up needed (Consult LCSW if needs arise)    Dionne Milo MSW [facility] Emergency Dept. Weekend/Social Worker [phone]

## 2011-10-10 NOTE — ED Provider Notes (Signed)
History     CSN: [id number]  Arrival date & time 10/10/11  1326   First MD Initiated Contact with Patient 10/10/11 1331      Chief Complaint  Patient presents with  . Bradycardia  . Near Syncope    (Consider location/radiation/quality/duration/timing/severity/associated sxs/prior treatment) HPI Comments: The patient is a 78 year old female with a history of coronary artery disease status post stenting, most recently this past February when she was diagnosed with myocardial infarction, diastolic heart failure, hyperlipidemia,hypertension, atrial fibrillation on warfarin anticoagulation and treated with digoxin, diltiazem, and Tikosyn, and high-dose potassium replacement.  She presents to the emergency department by EMS after reporting that she was feeling very weak and fatigued, having difficulty walking due to to weakness this afternoon while getting ready to go to church. Her son states that he checked her pulse finding it to be in the 40s, called her cardiologist's office, and was directed to call 911 and present to the emergency department by ambulance. On arrival in the emergency department, the patient is awake, alert, and oriented appropriately in no apparent distress while she is laying down in the stretcher. Her heart rate is 30 beats per minute and her ECG shows an apparent complete heart block with a junctional escape rhythm at 30 beats per minute. The patient was seen by me initially in the emergency department along with Dr. Riley Kill from cardiology. Initial results of an i-STAT chem 8 blood test showed the likely etiology, hyperkalemia. I began initial treatment of hyperkalemia in the emergency department with IV fluids, calcium chloride, dextrose and insulin, sodium bicarbonate, and oral Kayexalate. The patient then had worsening bradycardia with a heart rate declining down to 10 beats per minute at which time the patient exclaimed "I'm going... I'm leaving this world... I know it!"  She  became hypotensive, diaphoretic, and pale, and immediately we initiated external cardiac pacing using the pacing patches which were previously placed on her head of time. She was paced with 50 J at a rate of 70 beats per minute with good capture. Over time however, she would intermittently stop capturing, requiring an increase in the electrical energy to maintain cardiac capture. It was at this time that Dr. Gala Romney of cardiology made the decision to insert a temporary ventricular pacing wire to further stabilize the patient while her potassium was corrected.  No further history of present illness or review of systems is able to be obtained at the time of the encounter due to the patient's unstable vital signs and unstable clinical condition requiring immediate intervention. A level V caveat applies to this encounter.   The history is provided by the patient, medical records and the EMS personnel. The history is limited by the condition of the patient (pt. with unstable vital signs - symptomatic bradycardia).    Past Medical History  Diagnosis Date  . Hypertension     with h/o hypertensive urgency  . Glaucoma   . Tremor, essential   . Diabetes mellitus with neuropathy   . B12 deficiency   . Hyperlipidemia   . Atrial fibrillation     a. on chronic coumadin;  b. Tiksosyn adjusted to 250 mcg bid 08/2011 admission  . Chronic diastolic heart failure     echo 05/2009: mild LVH, EF 55-60%, grade 2 diast dysfxn, mild LAE, PASP  . CAD (coronary artery disease)     a.  LHC 06/2009: pLAD 40%, prox-mid D1 80% (small);  CFX 40-50%, mRCA 20%, EF 65%  -  med Rx;  b.  05/23/11 Myoview - EF 53%, no isch/inf;  c. 07/2011 NSTEMI - PCI/DES LCX - Resolute stent  . Carotid stenosis     dopplers 6/12: 1-39% bilat ICA  . Pulmonary fibrosis     Dr. Vassie Loll;  patient taken off O2 in 8/12; dyspnea felt CHF>>ILD  . Skin cancer   . Warfarin anticoagulation   . Hypomagnesemia     Past Surgical History  Procedure Date   . Appendectomy   . Nose surgery   . Cardiac catheterization     Family History  Problem Relation Age of Onset  . Throat cancer Brother   . Prostate cancer Father   . Diabetes Mother   . Diabetes Brother     multiple  . Heart disease Brother     History  Substance Use Topics  . Smoking status: Never Smoker   . Smokeless tobacco: Never Used  . Alcohol Use: No    OB History    Grav Para Term Preterm Abortions TAB SAB Ect Mult Living                  Review of Systems  Unable to perform ROS: Unstable vital signs    Allergies  Ace inhibitors; Crestor; Lipitor; Niaspan; Olmesartan; and Prednisone  Home Medications   Current Outpatient Rx  Name Route Sig Dispense Refill  . VITAMIN D 1000 UNITS PO CAPS Oral Take 1,000 Units by mouth daily. OTC      . CLOPIDOGREL BISULFATE 75 MG PO TABS Oral Take 75 mg by mouth daily.    . COLESEVELAM HCL 625 MG PO TABS Oral Take 1,250 mg by mouth daily.     Marland Kitchen VITAMIN B-12 IJ Injection Inject 1 application as directed every 30 (thirty) days. Given at the doctor's office at the end of each month    . DIGOXIN 0.125 MG PO TABS Oral Take 125 mcg by mouth daily.    Marland Kitchen DILTIAZEM HCL ER COATED BEADS 240 MG PO CP24 Oral Take 240 mg by mouth daily.    . DOFETILIDE 250 MCG PO CAPS Oral Take 1 capsule (250 mcg total) by mouth 2 (two) times daily.    . OMEGA-3 FATTY ACIDS 1000 MG PO CAPS Oral Take 1 g by mouth daily.    . FUROSEMIDE 40 MG PO TABS Oral Take 1 tablet (40 mg total) by mouth daily. 30 tablet 11  . GLIMEPIRIDE 4 MG PO TABS Oral Take 4 mg by mouth Twice daily.     . ISOSORBIDE MONONITRATE ER 30 MG PO TB24 Oral Take 30 mg by mouth daily.    Marland Kitchen LATANOPROST 0.005 % OP SOLN Both Eyes Place 1 drop into both eyes at bedtime.     Marland Kitchen MAGNESIUM OXIDE 400 MG PO TABS Oral Take 1 tablet (400 mg total) by mouth 3 (three) times daily. 90 tablet 11  . METFORMIN HCL 1000 MG PO TABS Oral Take 1,000 mg by mouth 2 (two) times daily with a meal.    .  METOPROLOL TARTRATE 50 MG PO TABS Oral Take 1 tablet (50 mg total) by mouth 3 (three) times daily. 90 tablet 11  . NITROGLYCERIN 0.4 MG SL SUBL Sublingual Place 0.4 mg under the tongue every 5 (five) minutes as needed. As needed for chest pain    . NOVOLOG MIX 70/30 FLEXPEN (70-30) 100 UNIT/ML [place] SUSP Subcutaneous Inject 20-34 Units into the skin Twice daily. 34 units in the morning & 20 units in the evening    .  POTASSIUM CHLORIDE CRYS ER 20 MEQ PO TBCR Oral Take 40 mEq by mouth 2 (two) times daily.    . WARFARIN SODIUM 5 MG PO TABS Oral Take 5-10 mg by mouth daily. Alternates days and takes 10 mg on Tuesdays, Thursdays & Saturday.  & 5 mg other days of the week.      BP 177/53  Pulse 70  Temp(Src) 97.4 F (36.3 C) (Oral)  Resp 27  SpO2 100%  Physical Exam  Nursing note and vitals reviewed. Constitutional: She is oriented to person, place, and time. No distress.  HENT:  Head: Normocephalic and atraumatic.  Mouth/Throat: Oropharynx is clear and moist.  Eyes: Conjunctivae and EOM are normal. Pupils are equal, round, and reactive to light.  Neck: Normal range of motion. No JVD present. No tracheal deviation present.  Cardiovascular: Regular rhythm, normal heart sounds and intact distal pulses.  Exam reveals no gallop and no friction rub.   No murmur heard.      Bradycardia  Pulmonary/Chest: Effort normal and breath sounds normal. No stridor. No respiratory distress. She has no wheezes. She has no rales.  Abdominal: Soft. Bowel sounds are normal. She exhibits no distension. There is no tenderness. There is no rebound and no guarding.  Musculoskeletal: Normal range of motion. She exhibits no edema and no tenderness.  Neurological: She is alert and oriented to person, place, and time. No cranial nerve deficit. She exhibits normal muscle tone. Coordination normal.  Skin: Skin is warm and dry. No rash noted. She is not diaphoretic. No erythema. No pallor.  Psychiatric: She has a normal mood  and affect. Her behavior is normal.    ED Course  Procedures (including critical care time)   Date: 10/10/2011  Rate: 39  Rhythm: Complete heart block with junctional escape rhythm, bradycardia  QRS Axis: left  Intervals: normal  ST/T Wave abnormalities: [place] T waves  Conduction Disutrbances:complete heart block  Narrative Interpretation: Concerning an unstable third-degree heart block, severe bradycardia  Old EKG Reviewed: none available    CRITICAL CARE Performed by: Felisa Bonier   Total critical care time: 90 minutes  Critical care time was exclusive of separately billable procedures and treating other patients.  Critical care was necessary to treat or prevent imminent or life-threatening deterioration.  Critical care was time spent personally by me on the following activities: development of treatment plan with patient and/or surrogate as well as nursing, discussions with consultants, evaluation of patient's response to treatment, examination of patient, obtaining history from patient or surrogate, ordering and performing treatments and interventions, ordering and review of laboratory studies, ordering and review of radiographic studies, pulse oximetry and re-evaluation of patient's condition.  Labs Reviewed  CBC - Abnormal; Notable for the following:    WBC 13.0 (*)    All other components within normal limits  DIFFERENTIAL - Abnormal; Notable for the following:    Neutro Abs 9.2 (*)    All other components within normal limits  BASIC METABOLIC PANEL - Abnormal; Notable for the following:    Sodium 130 (*)    Potassium 7.1 (*)    Glucose, Bld 292 (*)    GFR calc non Af Amer 78 (*)    All other components within normal limits  PROTIME-INR - Abnormal; Notable for the following:    Prothrombin Time 28.3 (*)    INR 2.60 (*)    All other components within normal limits  APTT - Abnormal; Notable for the following:    aPTT 38 (*)  All other components within  normal limits  POCT I-STAT, CHEM 8 - Abnormal; Notable for the following:    Potassium 7.0 (*)    Glucose, Bld 305 (*)    Calcium, Ion 1.37 (*)    All other components within normal limits  DIGOXIN LEVEL  POCT I-STAT TROPONIN I  CARDIAC PANEL(CRET KIN+CKTOT+MB+TROPI)  MAGNESIUM   No results found.   1. Complete heart block   2. Acute hyperkalemia       MDM  The patient's weakness, her primary symptom, appears to be due to extreme bradycardia secondary to complete heart block and junctional escape rhythm, likely precipitated by acute hyperkalemia, possibly do to increased exaggeration is intake of potassium, and not apparently do to acute renal failure, as well as the multiple AV nodal blocking agents that she is currently taking. Myocardial infarction is also a consideration, however initial cardiac enzymes are negative for ischemia. The patient had been emergently placed temporary cardiac pacing wire inserted into the right ventricle transvenously by Dr. Gala Romney. The patient will be admitted by cardiology to the CCU.        Felisa Bonier, MD 10/10/11 [phone]

## 2011-10-10 NOTE — Progress Notes (Signed)
ANTICOAGULATION CONSULT NOTE - Initial Consult  Pharmacy Consult for warfarin Indication: atrial fibrillation  Allergies  Allergen Reactions  . Ace Inhibitors Cough    Enalapril  benicor    . Crestor (Rosuvastatin Calcium)     Fatigue and leg pain   . Lipitor (Atorvastatin Calcium)     Myalgias   . Niaspan (Niacin)     Intol   . Olmesartan Cough  . Prednisone     REACTION: "retained fluid"    Patient Measurements:  Vital Signs: Temp: 97.4 F (36.3 C) (04/14 1326) Temp src: Oral (04/14 1326) BP: 177/53 mmHg (04/14 1420) Pulse Rate: 70  (04/14 1420)  Labs:  Basename 10/10/11 1339 10/10/11 1334 10/10/11 1333  HGB 13.3 -- 12.6  HCT 39.0 -- 37.9  PLT -- -- 224  APTT -- -- 38*  LABPROT -- -- 28.3*  INR -- -- 2.60*  HEPARINUNFRC -- -- --  CREATININE 0.80 -- 0.76  CKTOTAL -- 30 --  CKMB -- 2.5 --  TROPONINI -- <0.30 --   The CrCl is unknown because both a height and weight (above a minimum accepted value) are required for this calculation.  Medical History: Past Medical History  Diagnosis Date  . Hypertension     with h/o hypertensive urgency  . Glaucoma   . Tremor, essential   . Diabetes mellitus with neuropathy   . B12 deficiency   . Hyperlipidemia   . Atrial fibrillation     a. on chronic coumadin;  b. Tiksosyn adjusted to 250 mcg bid 08/2011 admission  . Chronic diastolic heart failure     echo 05/2009: mild LVH, EF 55-60%, grade 2 diast dysfxn, mild LAE, PASP  . CAD (coronary artery disease)     a.  LHC 06/2009: pLAD 40%, prox-mid D1 80% (small);  CFX 40-50%, mRCA 20%, EF 65%  -  med Rx;  b.  05/23/11 Myoview - EF 53%, no isch/inf;  c. 07/2011 NSTEMI - PCI/DES LCX - Resolute stent  . Carotid stenosis     dopplers 6/12: 1-39% bilat ICA  . Pulmonary fibrosis     Dr. Vassie Loll;  patient taken off O2 in 8/12; dyspnea felt CHF>>ILD  . Skin cancer   . Warfarin anticoagulation   . Hypomagnesemia     Medications:  Prescriptions prior to admission  Medication  Sig Dispense Refill  . Cholecalciferol (VITAMIN D) 1000 UNITS capsule Take 1,000 Units by mouth daily. OTC        . clopidogrel (PLAVIX) 75 MG tablet Take 75 mg by mouth daily.      . colesevelam (WELCHOL) 625 MG tablet Take 1,250 mg by mouth daily.       . Cyanocobalamin (VITAMIN B-12 IJ) Inject 1 application as directed every 30 (thirty) days. Given at the doctor's office at the end of each month      . digoxin (LANOXIN) 0.125 MG tablet Take 125 mcg by mouth daily.      Marland Kitchen diltiazem (CARDIZEM CD) 240 MG 24 hr capsule Take 240 mg by mouth daily.      Marland Kitchen dofetilide (TIKOSYN) 250 MCG capsule Take 1 capsule (250 mcg total) by mouth 2 (two) times daily.      . fish oil-omega-3 fatty acids 1000 MG capsule Take 1 g by mouth daily.      . furosemide (LASIX) 40 MG tablet Take 1 tablet (40 mg total) by mouth daily.  30 tablet  11  . glimepiride (AMARYL) 4 MG tablet Take 4 mg by  mouth Twice daily.       . isosorbide mononitrate (IMDUR) 30 MG 24 hr tablet Take 30 mg by mouth daily.      Marland Kitchen latanoprost (XALATAN) 0.005 % ophthalmic solution Place 1 drop into both eyes at bedtime.       . magnesium oxide (MAG-OX) 400 MG tablet Take 1 tablet (400 mg total) by mouth 3 (three) times daily.  90 tablet  11  . metFORMIN (GLUCOPHAGE) 1000 MG tablet Take 1,000 mg by mouth 2 (two) times daily with a meal.      . metoprolol (LOPRESSOR) 50 MG tablet Take 1 tablet (50 mg total) by mouth 3 (three) times daily.  90 tablet  11  . nitroGLYCERIN (NITROSTAT) 0.4 MG SL tablet Place 0.4 mg under the tongue every 5 (five) minutes as needed. As needed for chest pain      . NOVOLOG MIX 70/30 FLEXPEN (70-30) 100 UNIT/ML injection Inject 20-34 Units into the skin Twice daily. 34 units in the morning & 20 units in the evening      . potassium chloride SA (K-DUR,KLOR-CON) 20 MEQ tablet Take 40 mEq by mouth 2 (two) times daily.      Marland Kitchen warfarin (COUMADIN) 5 MG tablet Take 5-10 mg by mouth daily. Alternates days and takes 10 mg on Tuesdays,  Thursdays & Saturday.  & 5 mg other days of the week.        Assessment: Alexandria [name] is a 78 y.o. female with a history of paroxysmal atrial fibrillation, chronic Coumadin therapy, CAD, status post DES to the circumflex and 2/13 in the setting of NSTEMI, chronic diastolic heart failure, diabetes, hypertension, hyperlipidemia, carotid stenosis, pulmonary fibrosis. Admitted x 2 with afib last month. Her INR is therapeutic, without complications. She is s/p transvenous pacer today.  Goal of Therapy:  INR 2-3   Plan:  Continue home regimen with 5mg  today Daily INR Severiano Gilbert 10/10/2011,4:29 PM

## 2011-10-10 NOTE — ED Notes (Signed)
EMS-pt reports feeling like she was going to pass out while getting ready for church, HR noted in 30s. Denies chest pain. 324asa given.

## 2011-10-10 NOTE — ED Notes (Signed)
Dr. Milas Kocher at bedside. Continues to be externally paced, rate 70, 62 milliamps Pt remains A&OX4.

## 2011-10-10 NOTE — Procedures (Signed)
Central Venous Catheter Insertion Procedure Note Alexandria [name] [id number] August 13, 1929  Procedure: Insertion of Tranvenous pacemaker Indications: Symptomatic bradycardia  Procedure Details Consent: Risks of procedure as well as the alternatives and risks of each were explained to the (patient/caregiver).  Consent for procedure obtained. Time Out: Verified patient identification, verified procedure, site/side was marked, verified correct patient position, special equipment/implants available, medications/allergies/relevent history reviewed, required imaging and test results available.  Performed  Maximum sterile technique was used including antiseptics, cap, gloves, gown, hand hygiene, mask and sheet. Skin prep: Chlorhexidine; local anesthetic administered A venous sheath was placed using a modified Seldinger technique. Under flouro guidance the transvenous pacing wire was placed in the RV apex. There was good capture with a threshold of < 0.66mA. Output set at 3mA.  Evaluation Blood flow good Complications: No apparent complications Patient did tolerate procedure well. Chest X-ray ordered to verify placement.  CXR: pending.  Alexandria [name] 10/10/2011, 3:16 PM

## 2011-10-10 NOTE — ED Notes (Signed)
EKg shown to Dr. Fredricka Bonine and Cardiologist

## 2011-10-10 NOTE — ED Notes (Signed)
MD at bedside. 

## 2011-10-10 NOTE — ED Notes (Signed)
Pacer in place and confirmed by flouro 80 bpm and v output of 3

## 2011-10-10 NOTE — ED Notes (Signed)
2310-01 Ready

## 2011-10-10 NOTE — ED Notes (Signed)
External pacemaker changed to 62 mA at the time, capturing properly. Cath lab called in preparation for procedure.

## 2011-10-10 NOTE — ED Notes (Signed)
Dr. Riley Kill at the bedside. Pt remains on zoll pads, preparing to begin external pacemaker. Pt diaphoretic, remains bradycardic on monitor, rate of 25-30bpm.

## 2011-10-10 NOTE — H&P (Signed)
History and Physical  Patient ID: Alexandria [name] MRN: [id number], SOB: July 25, 1929 78 y.o. Date of Encounter: 10/10/2011, 2:19 PM  Primary Physician: [name], MD, MD Primary Cardiologist: Dr. [name]  Primary Electrophysiologist:  None   Chief Complaint: Weakness  History of Present Illness: Alexandria [name] is a 78 y.o. female with a history of paroxysmal atrial fibrillation, chronic Coumadin therapy, CAD, status post DES to the circumflex and 2/13 in the setting of NSTEMI, chronic diastolic heart failure, diabetes, hypertension, hyperlipidemia, carotid stenosis, pulmonary fibrosis.  She was admitted x 2 in 08/2011 with AFib.  She was put on Tikosyn and had not been able to tolerate higher doses of this drug in the past.  EP saw her and kept her on Tikosyn 250 mcg bid.  She saw Dr. [name] 10/06/11 and was doing well and remaining in NSR.  Recent holter was done without significant bradycardia.  Her nephew called the answering service this morning and noted that the patient felt weak at church and he palpated her pulse in the 40s.  He was directed to call 911 and was brought in via EMS.  She was found to be in junctional rhythm with HR 39.  K+ is 7.1.  She was treated in the ED with an amp of CaCl, D 50 and insulin, kayexalate and HCO3 with IVFs.  She brady'd down into the 10s and external pacer was applied.  She is now undergoing temporary pacemaker implantation.      Past Medical History  Diagnosis Date  . Hypertension     with h/o hypertensive urgency  . Glaucoma   . Tremor, essential   . Diabetes mellitus with neuropathy   . B12 deficiency   . Hyperlipidemia   . Atrial fibrillation     a. on chronic coumadin;  b. Tiksosyn adjusted to 250 mcg bid 08/2011 admission  . Chronic diastolic heart failure     echo 05/2009: mild LVH, EF 55-60%, grade 2 diast dysfxn, mild LAE, PASP  . CAD (coronary artery disease)     a.  LHC 06/2009: pLAD 40%, prox-mid D1 80% (small);  CFX  40-50%, mRCA 20%, EF 65%  -  med Rx;  b.  05/23/11 Myoview - EF 53%, no isch/inf;  c. 07/2011 NSTEMI - PCI/DES LCX - Resolute stent  . Carotid stenosis     dopplers 6/12: 1-39% bilat ICA  . Pulmonary fibrosis     Dr. Vassie Loll;  patient taken off O2 in 8/12; dyspnea felt CHF>>ILD  . Skin cancer   . Warfarin anticoagulation   . Hypomagnesemia      Past Surgical History  Procedure Date  . Appendectomy   . Nose surgery   . Cardiac catheterization       Current Facility-Administered Medications  Medication Dose Route Frequency Provider Last Rate Last Dose  . 0.9 %  sodium chloride infusion   Intravenous Continuous Felisa Bonier, MD 125 mL/hr at 10/10/11 1415    . calcium chloride injection 1 g  1 g Intravenous Once Felisa Bonier, MD   1 g at 10/10/11 1406  . calcium gluconate injection - for URGENT use only!  1 g Intravenous Once Felisa Bonier, MD      . dextrose 50 % solution 50 mL  1 ampule Intravenous Once Felisa Bonier, MD   50 mL at 10/10/11 1412  . insulin aspart (novoLOG) injection 10 Units  10 Units Intravenous Once Felisa Bonier, MD   10 Units  at 10/10/11 1414  . sodium bicarbonate injection 25 mEq  25 mEq Intravenous Once Felisa Bonier, MD   25 mEq at 10/10/11 1407  . sodium chloride 0.9 % bolus 1,000 mL  1,000 mL Intravenous Once Felisa Bonier, MD   1,000 mL at 10/10/11 1414  . sodium polystyrene (KAYEXALATE) 15 GM/60ML suspension 30 g  30 g Oral Once Felisa Bonier, MD   30 g at 10/10/11 1414  . DISCONTD: aspirin chewable tablet 324 mg  324 mg Oral Once Felisa Bonier, MD       Current Outpatient Prescriptions  Medication Sig Dispense Refill  . Cholecalciferol (VITAMIN D) 1000 UNITS capsule Take 1,000 Units by mouth daily. OTC        . clopidogrel (PLAVIX) 75 MG tablet Take 75 mg by mouth daily.      . colesevelam (WELCHOL) 625 MG tablet Take 1,250 mg by mouth daily.       . Cyanocobalamin (VITAMIN B-12 IJ) Inject 1 application as directed every 30  (thirty) days. Given at the doctor's office at the end of each month      . digoxin (LANOXIN) 0.125 MG tablet Take 125 mcg by mouth daily.      Marland Kitchen diltiazem (CARDIZEM CD) 240 MG 24 hr capsule Take 240 mg by mouth daily.      Marland Kitchen dofetilide (TIKOSYN) 250 MCG capsule Take 1 capsule (250 mcg total) by mouth 2 (two) times daily.      . fish oil-omega-3 fatty acids 1000 MG capsule Take 1 g by mouth daily.      . furosemide (LASIX) 40 MG tablet Take 1 tablet (40 mg total) by mouth daily.  30 tablet  11  . glimepiride (AMARYL) 4 MG tablet Take 4 mg by mouth Twice daily.       . isosorbide mononitrate (IMDUR) 30 MG 24 hr tablet Take 30 mg by mouth daily.      Marland Kitchen latanoprost (XALATAN) 0.005 % ophthalmic solution Place 1 drop into both eyes at bedtime.       . magnesium oxide (MAG-OX) 400 MG tablet Take 1 tablet (400 mg total) by mouth 3 (three) times daily.  90 tablet  11  . metFORMIN (GLUCOPHAGE) 1000 MG tablet Take 1,000 mg by mouth 2 (two) times daily with a meal.      . metoprolol (LOPRESSOR) 50 MG tablet Take 1 tablet (50 mg total) by mouth 3 (three) times daily.  90 tablet  11  . nitroGLYCERIN (NITROSTAT) 0.4 MG SL tablet Place 0.4 mg under the tongue every 5 (five) minutes as needed. As needed for chest pain      . NOVOLOG MIX 70/30 FLEXPEN (70-30) 100 UNIT/ML injection Inject 20-34 Units into the skin Twice daily. 34 units in the morning & 20 units in the evening      . potassium chloride SA (K-DUR,KLOR-CON) 20 MEQ tablet Take 40 mEq by mouth 2 (two) times daily.      Marland Kitchen warfarin (COUMADIN) 5 MG tablet Take 5-10 mg by mouth daily. Alternates days and takes 10 mg on Tuesdays, Thursdays & Saturday.  & 5 mg other days of the week.      Marland Kitchen DISCONTD: diltiazem (TIAZAC) 420 MG 24 hr capsule Take 420 mg by mouth daily.         Allergies: Allergies  Allergen Reactions  . Ace Inhibitors Cough    Enalapril  benicor    . Crestor (Rosuvastatin Calcium)     Fatigue  and leg pain   . Lipitor (Atorvastatin  Calcium)     Myalgias   . Niaspan (Niacin)     Intol   . Olmesartan Cough  . Prednisone     REACTION: "retained fluid"     History  Substance Use Topics  . Smoking status: Never Smoker   . Smokeless tobacco: Never Used  . Alcohol Use: No      Family History  Problem Relation Age of Onset  . Throat cancer Brother   . Prostate cancer Father   . Diabetes Mother   . Diabetes Brother     multiple  . Heart disease Brother       ROS:  Cannot obtain due to acuity level of patient.   Vital Signs: Blood pressure 186/57, pulse 70, temperature 97.4 F (36.3 C), temperature source Oral, resp. rate 20, SpO2 100.00%.  PHYSICAL EXAM: General:  Ill appearing. weak No resp difficulty HEENT: normal Neck: supple. JVP mildly elevated Carotids 2+ bilat; no bruits. No lymphadenopathy or thryomegaly appreciated. Cor: PMI nondisplaced. Regular bradycardic. No rubs, gallops or murmurs. Lungs: clear Abdomen: soft, nontender, nondistended. No hepatosplenomegaly. No bruits or masses. Good bowel sounds. Extremities: no cyanosis, clubbing, rash, edema Neuro: alert & orientedx3, cranial nerves grossly intact. moves all 4 extremities w/o difficulty. Affect pleasant    EKG:  Junctional escape, HR 39, no ST elevation   Labs:   Lab Results  Component Value Date   WBC 13.0* 10/10/2011   HGB 13.3 10/10/2011   HCT 39.0 10/10/2011   MCV 85.9 10/10/2011   PLT 224 10/10/2011     Lab 10/10/11 1339 10/10/11 1333  NA 136 --  K 7.0* --  CL 107 --  CO2 -- 22  BUN 20 --  CREATININE 0.80 --  CALCIUM -- 10.5  PROT -- --  BILITOT -- --  ALKPHOS -- --  ALT -- --  AST -- --  GLUCOSE 305* --   Magnesium  Date/Time Value Range Status  09/19/2011  6:00 AM 2.1  1.5-2.5 (mg/dL) Final   No results found for this basename: CKTOTAL:4,CKMB:4,TROPONINI:4 in the last 72 hours Lab Results  Component Value Date   CHOL 205* 08/15/2011   HDL 38* 08/15/2011   LDLCALC 127* 08/15/2011   TRIG 201* 08/15/2011    No results found for this basename: DDIMER   Lab Results  Component Value Date   INR 2.60* 10/10/2011   INR 1.89* 09/19/2011   INR 2.64* 09/18/2011     Radiology/Studies:     ASSESSMENT AND PLAN:   1. Junctional bradycardia Symptomatic in setting of profound hyperkalemia in patient on multiple AV nodal agents and high doses of supplemental K+.  Resuscitation efforts started in the ED.  Dr. Arvilla Meres now placing a temporary pacer.  Admit to CCU.  Hold AV nodal agents and Tikosyn.  Cycle CE's.  Continue to follow up on BMETs until K+ corrected.    2. HYPERTENSION Will likely need other agents added for control of BP.  3. CAD Cycle CE's.  Continue Plavix with recent DES to CFX in 07/2011.    4. Atrial fibrillation Continue warfarin.  Dose per pharmacy.    5.  Diabetes Mellitus Hold Metformin for now.  Cover with sliding scale insulin.  Signed,  Tereso Newcomer, PA-C  10/10/2011, 2:19 PM    Patient seen and examined with Tereso Newcomer PA-C. We discussed all aspects of the encounter. I agree with the assessment and plan as stated above. Patient with CHB  and severe junctional bradycardia requiring transvenous pacing in setting of severe hyperkalemia and high-dose AV nodal blockade. Hyperkalemia being treated with IVF, calcium, D50, insulin, kayexalate, lasix. Patient now s/p TVP. Will follow electrolytes closely.   ,MD 3:24 PM

## 2011-10-10 NOTE — ED Notes (Signed)
On arrival to department dr Riley Kill and EDP at bedisde, new ekg acquired, pt placed on zoll pads due to HR in 30s. Pt in gown and all belongings in bag. IV established. Pt is aware of POC.

## 2011-10-10 NOTE — ED Notes (Signed)
Report given to [facility], pt transferred to blue 17 for emergent temporary pacemaker insertion.

## 2011-10-11 ENCOUNTER — Inpatient Hospital Stay (HOSPITAL_COMMUNITY): Payer: Medicare Other

## 2011-10-11 DIAGNOSIS — E875 Hyperkalemia: Secondary | ICD-10-CM

## 2011-10-11 LAB — BASIC METABOLIC PANEL
BUN: 9 mg/dL (ref 6–23)
BUN: 9 mg/dL (ref 6–23)
CO2: 26 mEq/L (ref 19–32)
CO2: 29 mEq/L (ref 19–32)
Calcium: 9.9 mg/dL (ref 8.4–10.5)
Chloride: 100 mEq/L (ref 96–112)
Chloride: 103 mEq/L (ref 96–112)
Creatinine, Ser: 0.51 mg/dL (ref 0.50–1.10)
GFR calc Af Amer: >90 mL/min (ref >90)
GFR calc non Af Amer: 89 mL/min — ABNORMAL LOW (ref >90)
Glucose, Bld: 126 mg/dL — ABNORMAL HIGH (ref 70–99)
Glucose, Bld: 140 mg/dL — ABNORMAL HIGH (ref 70–99)
Potassium: 3.4 mEq/L — ABNORMAL LOW (ref 3.5–5.1)
Potassium: 3.4 mEq/L — ABNORMAL LOW (ref 3.5–5.1)
Sodium: 136 mEq/L (ref 135–145)

## 2011-10-11 LAB — GLUCOSE, CAPILLARY
Glucose-Capillary: 132 mg/dL — ABNORMAL HIGH (ref 70–99)
Glucose-Capillary: 208 mg/dL — ABNORMAL HIGH (ref 70–99)

## 2011-10-11 LAB — URINALYSIS, ROUTINE W REFLEX MICROSCOPIC
Nitrite: NEGATIVE
Specific Gravity, Urine: 1.013 (ref 1.005–1.030)
Urobilinogen, UA: 1 mg/dL (ref 0.0–1.0)
pH: 7.5 (ref 5.0–8.0)

## 2011-10-11 LAB — PROTIME-INR
INR: 2.51 — ABNORMAL HIGH (ref 0.00–1.49)
Prothrombin Time: 27.5 seconds — ABNORMAL HIGH (ref 11.6–15.2)

## 2011-10-11 LAB — CARDIAC PANEL(CRET KIN+CKTOT+MB+TROPI): Relative Index: INVALID (ref 0.0–2.5)

## 2011-10-11 LAB — URINE MICROSCOPIC-ADD ON

## 2011-10-11 MED ORDER — DOFETILIDE 250 MCG PO CAPS
250.0000 ug | ORAL_CAPSULE | Freq: Two times a day (BID) | ORAL | Status: DC
  Administered 2011-10-12 – 2011-10-15 (×6): 250 ug via ORAL
  Filled 2011-10-11 (×10): qty 1

## 2011-10-11 MED ORDER — DOFETILIDE 250 MCG PO CAPS: 250.0000 ug | ORAL_CAPSULE | Freq: Two times a day (BID) | ORAL | Status: DC

## 2011-10-11 MED ORDER — POTASSIUM CHLORIDE CRYS ER 20 MEQ PO TBCR
40.0000 meq | EXTENDED_RELEASE_TABLET | Freq: Once | ORAL | Status: AC
  Administered 2011-10-11: 40 meq via ORAL
  Filled 2011-10-11: qty 2

## 2011-10-11 NOTE — Progress Notes (Signed)
ANTICOAGULATION CONSULT NOTE - Follow-Up Consult  Pharmacy Consult for warfarin Indication: atrial fibrillation  Allergies  Allergen Reactions  . Ace Inhibitors Cough    Enalapril  benicor    . Crestor (Rosuvastatin Calcium)     Fatigue and leg pain   . Lipitor (Atorvastatin Calcium)     Myalgias   . Niaspan (Niacin)     Intol   . Olmesartan Cough  . Prednisone     REACTION: "retained fluid"    Patient Measurements:  Vital Signs: Temp: 98 F (36.7 C) (04/15 1211) Temp src: Oral (04/15 1211) BP: 147/56 mmHg (04/15 1300) Pulse Rate: 47  (04/15 1300)  Labs:  Basename 10/11/11 1229 10/11/11 0545 10/10/11 2201 10/10/11 1636 10/10/11 1635 10/10/11 1339 10/10/11 1333  HGB -- -- -- 12.4 -- 13.3 --  HCT -- -- -- 37.7 -- 39.0 37.9  PLT -- -- -- 187 -- -- 224  APTT -- -- -- -- -- -- 38*  LABPROT -- 27.5* -- -- -- -- 28.3*  INR -- 2.51* -- -- -- -- 2.60*  HEPARINUNFRC -- -- -- -- -- -- --  CREATININE 0.53 0.51 -- 0.68 -- -- --  CKTOTAL -- 25 30 -- 23 -- --  CKMB -- 2.7 2.9 -- 2.8 -- --  TROPONINI -- <0.30 <0.30 -- <0.30 -- --   The CrCl is unknown because both a height and weight (above a minimum accepted value) are required for this calculation.  Medical History: Past Medical History  Diagnosis Date  . Hypertension     with h/o hypertensive urgency  . Glaucoma   . Tremor, essential   . Diabetes mellitus with neuropathy   . B12 deficiency   . Hyperlipidemia   . Atrial fibrillation     a. on chronic coumadin;  b. Tiksosyn adjusted to 250 mcg bid 08/2011 admission  . Chronic diastolic heart failure     echo 05/2009: mild LVH, EF 55-60%, grade 2 diast dysfxn, mild LAE, PASP  . CAD (coronary artery disease)     a.  LHC 06/2009: pLAD 40%, prox-mid D1 80% (small);  CFX 40-50%, mRCA 20%, EF 65%  -  med Rx;  b.  05/23/11 Myoview - EF 53%, no isch/inf;  c. 07/2011 NSTEMI - PCI/DES LCX - Resolute stent  . Carotid stenosis     dopplers 6/12: 1-39% bilat ICA  . Pulmonary  fibrosis     Dr. Vassie Loll;  patient taken off O2 in 8/12; dyspnea felt CHF>>ILD  . Skin cancer   . Warfarin anticoagulation   . Hypomagnesemia     Medications:  Prescriptions prior to admission  Medication Sig Dispense Refill  . Cholecalciferol (VITAMIN D) 1000 UNITS capsule Take 1,000 Units by mouth daily. OTC        . clopidogrel (PLAVIX) 75 MG tablet Take 75 mg by mouth daily.      . colesevelam (WELCHOL) 625 MG tablet Take 1,250 mg by mouth daily.       . Cyanocobalamin (VITAMIN B-12 IJ) Inject 1 application as directed every 30 (thirty) days. Given at the doctor's office at the end of each month      . digoxin (LANOXIN) 0.125 MG tablet Take 125 mcg by mouth daily.      Marland Kitchen diltiazem (CARDIZEM CD) 240 MG 24 hr capsule Take 240 mg by mouth daily.      Marland Kitchen dofetilide (TIKOSYN) 250 MCG capsule Take 1 capsule (250 mcg total) by mouth 2 (two) times daily.      Marland Kitchen  fish oil-omega-3 fatty acids 1000 MG capsule Take 1 g by mouth daily.      . furosemide (LASIX) 40 MG tablet Take 1 tablet (40 mg total) by mouth daily.  30 tablet  11  . glimepiride (AMARYL) 4 MG tablet Take 4 mg by mouth Twice daily.       . isosorbide mononitrate (IMDUR) 30 MG 24 hr tablet Take 30 mg by mouth daily.      Marland Kitchen latanoprost (XALATAN) 0.005 % ophthalmic solution Place 1 drop into both eyes at bedtime.       . magnesium oxide (MAG-OX) 400 MG tablet Take 1 tablet (400 mg total) by mouth 3 (three) times daily.  90 tablet  11  . metFORMIN (GLUCOPHAGE) 1000 MG tablet Take 1,000 mg by mouth 2 (two) times daily with a meal.      . metoprolol (LOPRESSOR) 50 MG tablet Take 1 tablet (50 mg total) by mouth 3 (three) times daily.  90 tablet  11  . nitroGLYCERIN (NITROSTAT) 0.4 MG SL tablet Place 0.4 mg under the tongue every 5 (five) minutes as needed. As needed for chest pain      . NOVOLOG MIX 70/30 FLEXPEN (70-30) 100 UNIT/ML injection Inject 20-34 Units into the skin Twice daily. 34 units in the morning & 20 units in the evening        . potassium chloride SA (K-DUR,KLOR-CON) 20 MEQ tablet Take 40 mEq by mouth 2 (two) times daily.      Marland Kitchen warfarin (COUMADIN) 5 MG tablet Take 5-10 mg by mouth daily. Alternates days and takes 10 mg on Tuesdays, Thursdays & Saturday.  & 5 mg other days of the week.        Assessment: Alexandria [name] is a 78 y.o. female with a history of paroxysmal atrial fibrillation, chronic Coumadin therapy, CAD, status post DES to the circumflex and 2/13 in the setting of NSTEMI, chronic diastolic heart failure, diabetes, hypertension, hyperlipidemia, carotid stenosis, pulmonary fibrosis. Admitted x 2 with afib last month. Her INR is therapeutic, without complications. No bleeding noted.   Goal of Therapy:  INR 2-3   Plan:  Continue home regimen with 5mg  today Daily INR  Elson Clan 10/11/2011,2:07 PM

## 2011-10-11 NOTE — Progress Notes (Signed)
Pt transferred to [id number] via wheelchair while on telemetry. VSS. Pt tolerated well. Report given to [facility]. Pt oriented to unit and call bell. Bed in low and locked position.

## 2011-10-11 NOTE — Progress Notes (Signed)
Patient Name: Alexandria [name]      SUBJECTIVE:rhythm stable No complaints  Past Medical History  Diagnosis Date  . Hypertension     with h/o hypertensive urgency  . Glaucoma   . Tremor, essential   . Diabetes mellitus with neuropathy   . B12 deficiency   . Hyperlipidemia   . Atrial fibrillation     a. on chronic coumadin;  b. Tiksosyn adjusted to 250 mcg bid 08/2011 admission  . Chronic diastolic heart failure     echo 05/2009: mild LVH, EF 55-60%, grade 2 diast dysfxn, mild LAE, PASP  . CAD (coronary artery disease)     a.  LHC 06/2009: pLAD 40%, prox-mid D1 80% (small);  CFX 40-50%, mRCA 20%, EF 65%  -  med Rx;  b.  05/23/11 Myoview - EF 53%, no isch/inf;  c. 07/2011 NSTEMI - PCI/DES LCX - Resolute stent  . Carotid stenosis     dopplers 6/12: 1-39% bilat ICA  . Pulmonary fibrosis     Dr. Vassie Loll;  patient taken off O2 in 8/12; dyspnea felt CHF>>ILD  . Skin cancer   . Warfarin anticoagulation   . Hypomagnesemia     PHYSICAL EXAM Filed Vitals:   10/11/11 0500 10/11/11 0600 10/11/11 0700 10/11/11 0735  BP: 155/58 170/57 153/69   Pulse: 78 76 82   Temp:    97.8 F (36.6 C)  TempSrc:    Oral  Resp: 22 20 17    SpO2: 97% 98% 96%     Well developed and nourished in no acute distress HENT normal Neck supple with JVP-flat Clear Regular rate and rhythm, 2/6 m changes with varaible RR Abd-soft with active BS No Clubbing cyanosis edema Skin-warm and dry A & Oriented  Grossly normal sensory and motor function   TELEMETRY: Reviewed telemetry pt in NSR w pacs:    Intake/Output Summary (Last 24 hours) at 10/11/11 0810 Last data filed at 10/11/11 0700  Gross per 24 hour  Intake   2115 ml  Output   4605 ml  Net  -2490 ml    LABS: Basic Metabolic Panel:  Lab 10/11/11 1610 10/10/11 1636 10/10/11 1635 10/10/11 1357 10/10/11 1339 10/10/11 1333  NA 136 136 137 -- 136 130*  K 3.4* 4.7 4.8 -- 7.0* 7.1*  CL 100 103 104 -- 107 99  CO2 26 24 24  -- -- 22  GLUCOSE 126* 201* 201*  -- 305* 292*  BUN 9 15 14  -- 20 17  CREATININE 0.51 0.68 0.67 -- 0.80 0.76  CALCIUM 9.9 10.8* -- -- -- --  MG -- 1.7 -- 1.9 -- --  PHOS -- -- -- -- -- --   Cardiac Enzymes:  Basename 10/11/11 0545 10/10/11 2201 10/10/11 1635  CKTOTAL 25 30 23   CKMB 2.7 2.9 2.8  CKMBINDEX -- -- --  TROPONINI <0.30 <0.30 <0.30   CBC:  Lab 10/10/11 1636 10/10/11 1339 10/10/11 1333  WBC 9.0 -- 13.0*  NEUTROABS 7.0 -- 9.2*  HGB 12.4 13.3 12.6  HCT 37.7 39.0 37.9  MCV 87.1 -- 85.9  PLT 187 -- 224   PROTIME:  Basename 10/11/11 0545 10/10/11 1333  LABPROT 27.5* 28.3*  INR 2.51* 2.60*   Liver Function Tests:  Basename 10/10/11 1636  AST 17  ALT 16  ALKPHOS 88  BILITOT 0.6  PROT 7.2  ALBUMIN 3.4*     Basename 10/10/11 1636  TSH 2.197  T4TOTAL --  T3FREE --  THYROIDAB --     ASSESSMENT AND PLAN:  Patient Active Hospital Problem List: Hyperkalemia (10/11/2011)  Junctional bradycardia (10/10/2011)  HYPERTENSION (07/28/2007)  CAD (06/16/2009)  Atrial fibrillation (09/05/2009)  Hyperkalemia resolved and with it the bradycardia  We will transfer to floor normalize K and resume tikosyn hopefully tonight   Not clear what happened with K to get so high  Unless dose changed recently    Signed, Sherryl Manges MD  10/11/2011

## 2011-10-11 NOTE — Progress Notes (Addendum)
Transvenous pacer was found to be unattacted to pacer, which was previously attached.  Upon inspection it was also noted that the transvenous wire was further out than it was at 2345.  Patient stated that when the person from lab was in to draw blood that she heard something fall and then the line in her neck was being pulled on.  The patient's heart rate is currently in the 80's SR with PAC's.  Dr Donnie Aho was notified and order received to check chest Xray.

## 2011-10-12 ENCOUNTER — Encounter (HOSPITAL_COMMUNITY): Payer: Self-pay | Admitting: *Deleted

## 2011-10-12 LAB — POTASSIUM: Potassium: 4.2 mEq/L (ref 3.5–5.1)

## 2011-10-12 LAB — BASIC METABOLIC PANEL
Calcium: 10 mg/dL (ref 8.4–10.5)
Creatinine, Ser: 0.48 mg/dL — ABNORMAL LOW (ref 0.50–1.10)
GFR calc non Af Amer: >90 mL/min (ref >90)
Glucose, Bld: 120 mg/dL — ABNORMAL HIGH (ref 70–99)
Sodium: 139 mEq/L (ref 135–145)

## 2011-10-12 LAB — GLUCOSE, CAPILLARY
Glucose-Capillary: 180 mg/dL — ABNORMAL HIGH (ref 70–99)
Glucose-Capillary: 140 mg/dL — ABNORMAL HIGH (ref 70–99)
Glucose-Capillary: 204 mg/dL — ABNORMAL HIGH (ref 70–99)

## 2011-10-12 MED ORDER — METOPROLOL TARTRATE 50 MG PO TABS
50.0000 mg | ORAL_TABLET | Freq: Every day | ORAL | Status: DC
  Administered 2011-10-12 – 2011-10-14 (×3): 50 mg via ORAL
  Filled 2011-10-12 (×5): qty 1

## 2011-10-12 MED ORDER — FUROSEMIDE 20 MG PO TABS
20.0000 mg | ORAL_TABLET | Freq: Once | ORAL | Status: AC
  Administered 2011-10-12: 20 mg via ORAL
  Filled 2011-10-12: qty 1

## 2011-10-12 MED ORDER — METOPROLOL TARTRATE 100 MG PO TABS
100.0000 mg | ORAL_TABLET | Freq: Every day | ORAL | Status: DC
  Administered 2011-10-13 – 2011-10-15 (×3): 100 mg via ORAL
  Filled 2011-10-12 (×3): qty 1

## 2011-10-12 MED ORDER — POTASSIUM CHLORIDE CRYS ER 20 MEQ PO TBCR
40.0000 meq | EXTENDED_RELEASE_TABLET | Freq: Once | ORAL | Status: AC
  Administered 2011-10-12: 40 meq via ORAL
  Filled 2011-10-12: qty 1

## 2011-10-12 MED ORDER — POTASSIUM CHLORIDE CRYS ER 20 MEQ PO TBCR
40.0000 meq | EXTENDED_RELEASE_TABLET | Freq: Once | ORAL | Status: AC
  Administered 2011-10-12: 40 meq via ORAL
  Filled 2011-10-12: qty 2

## 2011-10-12 NOTE — Progress Notes (Addendum)
ANTICOAGULATION CONSULT NOTE - Follow-Up Consult  Pharmacy Consult for warfarin Indication: atrial fibrillation  Allergies  Allergen Reactions  . Ace Inhibitors Cough    Enalapril  benicor    . Crestor (Rosuvastatin Calcium)     Fatigue and leg pain   . Lipitor (Atorvastatin Calcium)     Myalgias   . Niaspan (Niacin)     Intol   . Olmesartan Cough  . Prednisone     REACTION: "retained fluid"    Patient Measurements:  Vital Signs: Temp: 98.4 F (36.9 C) (04/16 1339) Temp src: Oral (04/16 1339) BP: 133/79 mmHg (04/16 1339) Pulse Rate: 95  (04/16 1339)  Labs:  Basename 10/12/11 1205 10/12/11 0550 10/11/11 1229 10/11/11 0545 10/10/11 2201 10/10/11 1636 10/10/11 1635 10/10/11 1339 10/10/11 1333  HGB -- -- -- -- -- 12.4 -- 13.3 --  HCT -- -- -- -- -- 37.7 -- 39.0 37.9  PLT -- -- -- -- -- 187 -- -- 224  APTT -- -- -- -- -- -- -- -- 38*  LABPROT -- 26.6* -- 27.5* -- -- -- -- 28.3*  INR -- 2.41* -- 2.51* -- -- -- -- 2.60*  HEPARINUNFRC -- -- -- -- -- -- -- -- --  CREATININE 0.48* -- 0.53 0.51 -- -- -- -- --  CKTOTAL -- -- -- 25 30 -- 23 -- --  CKMB -- -- -- 2.7 2.9 -- 2.8 -- --  TROPONINI -- -- -- <0.30 <0.30 -- <0.30 -- --   The CrCl is unknown because both a height and weight (above a minimum accepted value) are required for this calculation.  Medical History: Past Medical History  Diagnosis Date  . Hypertension     with h/o hypertensive urgency  . Glaucoma   . Tremor, essential   . Diabetes mellitus with neuropathy   . B12 deficiency   . Hyperlipidemia   . Atrial fibrillation     a. on chronic coumadin;  b. Tiksosyn adjusted to 250 mcg bid 08/2011 admission  . Chronic diastolic heart failure     echo 05/2009: mild LVH, EF 55-60%, grade 2 diast dysfxn, mild LAE, PASP  . CAD (coronary artery disease)     a.  LHC 06/2009: pLAD 40%, prox-mid D1 80% (small);  CFX 40-50%, mRCA 20%, EF 65%  -  med Rx;  b.  05/23/11 Myoview - EF 53%, no isch/inf;  c. 07/2011 NSTEMI -  PCI/DES LCX - Resolute stent  . Carotid stenosis     dopplers 6/12: 1-39% bilat ICA  . Pulmonary fibrosis     Dr. Vassie Loll;  patient taken off O2 in 8/12; dyspnea felt CHF>>ILD  . Skin cancer   . Warfarin anticoagulation   . Hypomagnesemia     Medications:  Prescriptions prior to admission  Medication Sig Dispense Refill  . Cholecalciferol (VITAMIN D) 1000 UNITS capsule Take 1,000 Units by mouth daily. OTC        . clopidogrel (PLAVIX) 75 MG tablet Take 75 mg by mouth daily.      . colesevelam (WELCHOL) 625 MG tablet Take 1,250 mg by mouth daily.       . Cyanocobalamin (VITAMIN B-12 IJ) Inject 1 application as directed every 30 (thirty) days. Given at the doctor's office at the end of each month      . digoxin (LANOXIN) 0.125 MG tablet Take 125 mcg by mouth daily.      Marland Kitchen diltiazem (CARDIZEM CD) 240 MG 24 hr capsule Take 240 mg by mouth daily.      Marland Kitchen  dofetilide (TIKOSYN) 250 MCG capsule Take 1 capsule (250 mcg total) by mouth 2 (two) times daily.      . fish oil-omega-3 fatty acids 1000 MG capsule Take 1 g by mouth daily.      . furosemide (LASIX) 40 MG tablet Take 1 tablet (40 mg total) by mouth daily.  30 tablet  11  . glimepiride (AMARYL) 4 MG tablet Take 4 mg by mouth Twice daily.       . isosorbide mononitrate (IMDUR) 30 MG 24 hr tablet Take 30 mg by mouth daily.      Marland Kitchen latanoprost (XALATAN) 0.005 % ophthalmic solution Place 1 drop into both eyes at bedtime.       . magnesium oxide (MAG-OX) 400 MG tablet Take 1 tablet (400 mg total) by mouth 3 (three) times daily.  90 tablet  11  . metFORMIN (GLUCOPHAGE) 1000 MG tablet Take 1,000 mg by mouth 2 (two) times daily with a meal.      . metoprolol (LOPRESSOR) 50 MG tablet Take 1 tablet (50 mg total) by mouth 3 (three) times daily.  90 tablet  11  . nitroGLYCERIN (NITROSTAT) 0.4 MG SL tablet Place 0.4 mg under the tongue every 5 (five) minutes as needed. As needed for chest pain      . NOVOLOG MIX 70/30 FLEXPEN (70-30) 100 UNIT/ML injection  Inject 20-34 Units into the skin Twice daily. 34 units in the morning & 20 units in the evening      . potassium chloride SA (K-DUR,KLOR-CON) 20 MEQ tablet Take 40 mEq by mouth 2 (two) times daily.      Marland Kitchen warfarin (COUMADIN) 5 MG tablet Take 5-10 mg by mouth daily. Alternates days and takes 10 mg on Tuesdays, Thursdays & Saturday.  & 5 mg other days of the week.        Assessment: Alexandria [name] is a 78 y.o. female with a history of paroxysmal atrial fibrillation, chronic Coumadin therapy, CAD, status post DES to the circumflex and 2/13 in the setting of NSTEMI, chronic diastolic heart failure, diabetes, hypertension, hyperlipidemia, carotid stenosis, pulmonary fibrosis. Admitted x 2 with afib last month. Her INR is therapeutic and trending down, will increase dose closer to home regimen. No bleeding noted.   Goal of Therapy:  INR 2-3   Plan:  Coumadin 10mg  today per home dosing regimen and f/u daily protime.  Verlene Mayer, PharmD, BCPS Pager [phone] 10/12/2011,1:46 PM

## 2011-10-12 NOTE — Progress Notes (Signed)
Patient Name: Alexandria [name] Date of Encounter: 10/12/2011     Principal Problem:  *Hyperkalemia Active Problems:  HYPERTENSION  CAD  Atrial fibrillation  Junctional bradycardia    SUBJECTIVE  Good spirits.  Labs stable.  Feels a little sob.  Fluids running @ 125/hr.  Maintaining sinus but tachycardic.  CURRENT MEDS    . cholecalciferol  1,000 Units Oral Daily  . clopidogrel  75 mg Oral Daily  . colesevelam  1,250 mg Oral Daily  . dofetilide  250 mcg Oral Q12H  . glimepiride  4 mg Oral BID WC  . insulin aspart  0-20 Units Subcutaneous TID WC  . insulin aspart protamine-insulin aspart  20 Units Subcutaneous Q supper  . insulin aspart protamine-insulin aspart  34 Units Subcutaneous QAC breakfast  . isosorbide mononitrate  30 mg Oral Daily  . latanoprost  1 drop Both Eyes QHS  . magnesium oxide  400 mg Oral TID  . warfarin  10 mg Oral Q T,Th,Sat-1800  . warfarin  5 mg Oral Custom  . Warfarin - Pharmacist Dosing Inpatient   Does not apply q1800    OBJECTIVE  Filed Vitals:   10/11/11 1635 10/11/11 2019 10/12/11 0434 10/12/11 1339  BP: 174/83 162/83 176/99 133/79  Pulse: 89 87 90 95  Temp: 98 F (36.7 C) 97.6 F (36.4 C) 97.1 F (36.2 C) 98.4 F (36.9 C)  TempSrc: Oral Oral Oral Oral  Resp: 18 20 19 20   SpO2: 98% 97% 94% 93%    Intake/Output Summary (Last 24 hours) at 10/12/11 1722 Last data filed at 10/12/11 1400  Gross per 24 hour  Intake 2636.67 ml  Output   2401 ml  Net 235.67 ml   There were no vitals filed for this visit.  PHYSICAL EXAM  General: Pleasant, NAD. Neuro: Alert and oriented X 3. Moves all extremities spontaneously. Psych: Normal affect. HEENT:  Normal  Neck: Supple without bruits.  JVP approx 10cm. Lungs:  Resp regular and unlabored, bibasilar crackles - more pronounced on right. Heart: RRR no s3, s4, or murmurs. Abdomen: Soft, non-tender, non-distended, BS + x 4.  Extremities: No clubbing, cyanosis or edema. DP/PT/Radials 2+ and  equal bilaterally.  Accessory Clinical Findings  CBC  Basename 10/10/11 1636 10/10/11 1339 10/10/11 1333  WBC 9.0 -- 13.0*  NEUTROABS 7.0 -- 9.2*  HGB 12.4 13.3 --  HCT 37.7 39.0 --  MCV 87.1 -- 85.9  PLT 187 -- 224   Basic Metabolic Panel  Basename 10/12/11 1205 10/11/11 1229 10/10/11 1636 10/10/11 1357  NA 139 139 -- --  K 3.9 3.4* -- --  CL 105 103 -- --  CO2 26 29 -- --  GLUCOSE 120* 140* -- --  BUN 8 9 -- --  CREATININE 0.48* 0.53 -- --  CALCIUM 10.0 9.6 -- --  MG -- -- 1.7 1.9  PHOS -- -- -- --   Liver Function Tests  Basename 10/10/11 1636  AST 17  ALT 16  ALKPHOS 88  BILITOT 0.6  PROT 7.2  ALBUMIN 3.4*   Cardiac Enzymes  Basename 10/11/11 0545 10/10/11 2201 10/10/11 1635  CKTOTAL 25 30 23   CKMB 2.7 2.9 2.8  CKMBINDEX -- -- --  TROPONINI <0.30 <0.30 <0.30   Thyroid Function Tests  Basename 10/10/11 1636  TSH 2.197  T4TOTAL --  T3FREE --  THYROIDAB --    TELE  Sinus tach, pac's - low 100's to 1-teens with activity.  I have personally reviewed to telemetry. (JK)  There is  mild sinus tachycardia.  ASSESSMENT AND PLAN  1.  Hyperkalemia:  Resolved.  Pt was on bid @ home.  Renal function was nl on admission.  ? Etiology.  3.9 today.  2.  Jxnl bradycardia:  Resolved.  Pt is actually in sinus tach.  Resume bb.    3.  Afib:  In sinus (tach).  Tikosyn resumed.  Resume bb.  F/u HR in am and consider reinstitution of dilt.  prob hold off on digoxin.  4.  SOB:  Pt c/o mild dyspnea.  Fluids have been running @ 125/hr continuously and she does have r>l crackles on exam with mild elevation of neck veins.  D/C IV fluid.  Will give 1 dose of lasix 20mg  PO.  Signed, Nicolasa Ducking NP Patient seen and examined. I agree with the assessment and plan as detailed above. See also my additional thoughts below.   I have seen and examined the patient. I reviewed the information completely with Nicolasa Ducking, PA-C.  Her hyperkalemia was resolved, and in  fact her potassium was low. It has now been pushed back up.Will have to be followed carefully. Her potassium must be 4 or greater before she can go home on Tikosyn. Her junctional bradycardia is resolved. I feel we can resume low-dose beta-blockade. She is still holding sinus with the Tikosyn which has now been restarted. Some of her shortness of breath today may be due to increased volume. We have cut back her IV and will give her a low dose of Lasix.  Willa Rough, MD, [facility] 10/12/2011 5:51 PM

## 2011-10-12 NOTE — Progress Notes (Signed)
Patient ID: Alexandria [name], female   DOB: 06-06-1930, 78 y.o.   MRN: [id number]   The patient's potassium was 3.9 this morning. Because of this Tikosyn was not given. Unfortunately our team and did not become aware of this until a few minutes ago. I've given the patient more potassium and will check her labs and make sure that she gets a dose of Tikosyn tonight.   Jerral Bonito, MD

## 2011-10-12 NOTE — Discharge Instructions (Signed)
Atrial Fibrillation Your caregiver has diagnosed you with atrial fibrillation (AFib). The heart normally beats very regularly; AFib is a type of irregular heartbeat. The heart rate may be faster or slower than normal. This can prevent your heart from pumping as well as it should. AFib can be constant (chronic) or intermittent (paroxysmal). CAUSES  Atrial fibrillation may be caused by:  Heart disease, including heart attack, coronary artery disease, heart failure, diseases of the heart valves, and others.   Blood clot in the lungs (pulmonary embolism).   Pneumonia or other infections.   Chronic lung disease.   Thyroid disease.   Toxins. These include alcohol, some medications (such as decongestant medications or diet pills), and caffeine.  In some people, no cause for AFib can be found. This is referred to as Lone Atrial Fibrillation. SYMPTOMS   Palpitations or a fluttering in your chest.   A vague sense of chest discomfort.   Shortness of breath.   Sudden onset of lightheadedness or weakness.  Sometimes, the first sign of AFib can be a complication of the condition. This could be a stroke or heart failure. DIAGNOSIS  Your description of your condition may make your caregiver suspicious of atrial fibrillation. Your caregiver will examine your pulse to determine if fibrillation is present. An EKG (electrocardiogram) will confirm the diagnosis. Further testing may help determine what caused you to have atrial fibrillation. This may include chest x-ray, echocardiogram, blood tests, or CT scans. PREVENTION  If you have previously had atrial fibrillation, your caregiver may advise you to avoid substances known to cause the condition (such as stimulant medications, and possibly caffeine or alcohol). You may be advised to use medications to prevent recurrence. Proper treatment of any underlying condition is important to help prevent recurrence. PROGNOSIS  Atrial fibrillation does tend to  become a chronic condition over time. It can cause significant complications (see below). Atrial fibrillation is not usually immediately life-threatening, but it can shorten your life expectancy. This seems to be worse in women. If you have lone atrial fibrillation and are under 60 years old, the risk of complications is very low, and life expectancy is not shortened. RISKS AND COMPLICATIONS  Complications of atrial fibrillation can include stroke, chest pain, and heart failure. Your caregiver will recommend treatments for the atrial fibrillation, as well as for any underlying conditions, to help minimize risk of complications. TREATMENT  Treatment for AFib is divided into several categories:  Treatment of any underlying condition.   Converting you out of AFib into a regular (sinus) rhythm.   Controlling rapid heart rate.   Prevention of blood clots and stroke.  Medications and procedures are available to convert your atrial fibrillation to sinus rhythm. However, recent studies have shown that this may not offer you any advantage, and cardiac experts are continuing research and debate on this topic. More important is controlling your rapid heartbeat. The rapid heartbeat causes more symptoms, and places strain on your heart. Your caregiver will advise you on the use of medications that can control your heart rate. Atrial fibrillation is a strong stroke risk. You can lessen this risk by taking blood thinning medications such as Coumadin (warfarin), or sometimes aspirin. These medications need close monitoring by your caregiver. Over-medication can cause bleeding. Too little medication may not protect against stroke. HOME CARE INSTRUCTIONS   If your caregiver prescribed medicine to make your heartbeat more normally, take as directed.   If blood thinners were prescribed by your caregiver, take EXACTLY as directed.     Perform blood tests EXACTLY as directed.   Quit smoking. Smoking increases your  cardiac and lung (pulmonary) risks.   DO NOT drink alcohol.   DO NOT drink caffeinated drinks (e.g. coffee, soda, chocolate, and leaf teas). You may drink decaffeinated coffee, soda or tea.   If you are overweight, you should choose a reduced calorie diet to lose weight. Please see a registered dietitian if you need more information about healthy weight loss. DO NOT USE DIET PILLS as they may aggravate heart problems.   If you have other heart problems that are causing AFib, you may need to eat a low salt, fat, and cholesterol diet. Your caregiver will tell you if this is necessary.   Exercise every day to improve your physical fitness. Stay active unless advised otherwise.   If your caregiver has given you a follow-up appointment, it is very important to keep that appointment. Not keeping the appointment could result in heart failure or stroke. If there is any problem keeping the appointment, you must call back to this facility for assistance.  SEEK MEDICAL CARE IF:  You notice a change in the rate, rhythm or strength of your heartbeat.   You develop an infection or any other change in your overall health status.  SEEK IMMEDIATE MEDICAL CARE IF:   You develop chest pain, abdominal pain, sweating, weakness or feel sick to your stomach (nausea).   You develop shortness of breath.   You develop swollen feet and ankles.   You develop dizziness, numbness, or weakness of your face or limbs, or any change in vision or speech.  MAKE SURE YOU:   Understand these instructions.   Will watch your condition.   Will get help right away if you are not doing well or get worse.  Document Released: 06/14/2005 Document Revised: 06/03/2011 Document Reviewed: 01/17/2008 Exit[employer] Patient Information 2012 [employer]. 

## 2011-10-13 ENCOUNTER — Other Ambulatory Visit: Payer: Self-pay

## 2011-10-13 ENCOUNTER — Encounter (INDEPENDENT_AMBULATORY_CARE_PROVIDER_SITE_OTHER): Payer: Medicare Other | Admitting: Ophthalmology

## 2011-10-13 LAB — BASIC METABOLIC PANEL
Calcium: 10.2 mg/dL (ref 8.4–10.5)
GFR calc non Af Amer: 85 mL/min — ABNORMAL LOW (ref >90)
Glucose, Bld: 112 mg/dL — ABNORMAL HIGH (ref 70–99)
Potassium: 4 mEq/L (ref 3.5–5.1)
Sodium: 137 mEq/L (ref 135–145)

## 2011-10-13 LAB — GLUCOSE, CAPILLARY
Glucose-Capillary: 112 mg/dL — ABNORMAL HIGH (ref 70–99)
Glucose-Capillary: 67 mg/dL — ABNORMAL LOW (ref 70–99)
Glucose-Capillary: 144 mg/dL — ABNORMAL HIGH (ref 70–99)

## 2011-10-13 LAB — URINALYSIS, ROUTINE W REFLEX MICROSCOPIC
Nitrite: NEGATIVE
Protein, ur: NEGATIVE mg/dL
Specific Gravity, Urine: 1.017 (ref 1.005–1.030)
Urobilinogen, UA: 0.2 mg/dL (ref 0.0–1.0)

## 2011-10-13 LAB — URINE MICROSCOPIC-ADD ON

## 2011-10-13 LAB — PROTIME-INR: INR: 2.42 — ABNORMAL HIGH (ref 0.00–1.49)

## 2011-10-13 MED ORDER — CIPROFLOXACIN HCL 250 MG PO TABS
250.0000 mg | ORAL_TABLET | Freq: Two times a day (BID) | ORAL | Status: DC
  Administered 2011-10-13 – 2011-10-15 (×4): 250 mg via ORAL
  Filled 2011-10-13 (×6): qty 1

## 2011-10-13 MED ORDER — AMLODIPINE BESYLATE 2.5 MG PO TABS
2.5000 mg | ORAL_TABLET | Freq: Every day | ORAL | Status: DC
  Administered 2011-10-13 – 2011-10-15 (×3): 2.5 mg via ORAL
  Filled 2011-10-13 (×3): qty 1

## 2011-10-13 NOTE — Progress Notes (Signed)
SUBJECTIVE:  No chest pain.  No SOB.  No palpitations   PHYSICAL EXAM Filed Vitals:   10/13/11 0427 10/13/11 0429 10/13/11 0550 10/13/11 1042  BP: 184/73 168/74  133/74  Pulse: 81  81 80  Temp: 98 F (36.7 C)  98 F (36.7 C)   TempSrc: Oral  Oral   Resp: 18  17   Height:      Weight:   70.3 kg (154 lb 15.7 oz)   SpO2: 97%  97%    General:  No distress Lungs:  Clear Heart:  RRR Abdomen:  Positive bowel sounds, no rebound no guarding Extremities:  No edema  LABS: Lab Results  Component Value Date   CKTOTAL 25 10/11/2011   CKMB 2.7 10/11/2011   TROPONINI <0.30 10/11/2011   Results for orders placed during the hospital encounter of 10/10/11 (from the past 24 hour(s))  GLUCOSE, CAPILLARY     Status: Abnormal   Collection Time   10/12/11 11:27 AM      Component Value Range   Glucose-Capillary 140 (*) 70 - 99 (mg/dL)   Comment 1 Documented in Chart     Comment 2 Notify RN    BASIC METABOLIC PANEL     Status: Abnormal   Collection Time   10/12/11 12:05 PM      Component Value Range   Sodium 139  135 - 145 (mEq/L)   Potassium 3.9  3.5 - 5.1 (mEq/L)   Chloride 105  96 - 112 (mEq/L)   CO2 26  19 - 32 (mEq/L)   Glucose, Bld 120 (*) 70 - 99 (mg/dL)   BUN 8  6 - 23 (mg/dL)   Creatinine, Ser 4.09 (*) 0.50 - 1.10 (mg/dL)   Calcium 81.1  8.4 - 10.5 (mg/dL)   GFR calc non Af Amer >90  >90 (mL/min)   GFR calc Af Amer >90  >90 (mL/min)  GLUCOSE, CAPILLARY     Status: Abnormal   Collection Time   10/12/11  4:25 PM      Component Value Range   Glucose-Capillary 106 (*) 70 - 99 (mg/dL)   Comment 1 Documented in Chart     Comment 2 Notify RN    POTASSIUM     Status: Normal   Collection Time   10/12/11  8:33 PM      Component Value Range   Potassium 4.2  3.5 - 5.1 (mEq/L)  GLUCOSE, CAPILLARY     Status: Abnormal   Collection Time   10/12/11  9:17 PM      Component Value Range   Glucose-Capillary 204 (*) 70 - 99 (mg/dL)  GLUCOSE, CAPILLARY     Status: Abnormal   Collection  Time   10/13/11  6:12 AM      Component Value Range   Glucose-Capillary 112 (*) 70 - 99 (mg/dL)   Comment 1 Notify RN    PROTIME-INR     Status: Abnormal   Collection Time   10/13/11  6:25 AM      Component Value Range   Prothrombin Time 26.7 (*) 11.6 - 15.2 (seconds)   INR 2.42 (*) 0.00 - 1.49   BASIC METABOLIC PANEL     Status: Abnormal   Collection Time   10/13/11  6:25 AM      Component Value Range   Sodium 137  135 - 145 (mEq/L)   Potassium 4.0  3.5 - 5.1 (mEq/L)   Chloride 104  96 - 112 (mEq/L)   CO2 24  19 - 32 (mEq/L)   Glucose, Bld 112 (*) 70 - 99 (mg/dL)   BUN 12  6 - 23 (mg/dL)   Creatinine, Ser 1.61  0.50 - 1.10 (mg/dL)   Calcium 09.6  8.4 - 10.5 (mg/dL)   GFR calc non Af Amer 85 (*) >90 (mL/min)   GFR calc Af Amer >90  >90 (mL/min)    Intake/Output Summary (Last 24 hours) at 10/13/11 1043 Last data filed at 10/13/11 0745  Gross per 24 hour  Intake    480 ml  Output   1100 ml  Net   -620 ml    ASSESSMENT AND PLAN:  1) Hyperkalemia:  Resolved.     2) HYPERTENSION:  BP still up slightly.  I will add a low dose of Norvasc.     3) CAD: On Plavix       4) Atrial fibrillation:  Restarting Tikosyn.  She will need to be here again through 6 doses.  (2/6 given as of this am.)  She remains on warfarin and in NSR     5) Junctional bradycardia:  Resolved    [name] 10/13/2011 10:43 AM

## 2011-10-13 NOTE — Progress Notes (Signed)
CBG: 67  Treatment: 15 GM carbohydrate snack  Symptoms: Hungry  Follow-up CBG: Time:1743 CBG Result:113  Possible Reasons for Event: Unknown  Comments/MD notified:No    Alexandria [name], [name]

## 2011-10-13 NOTE — Progress Notes (Signed)
ANTICOAGULATION CONSULT NOTE - Follow-Up Consult  Pharmacy Consult for warfarin Indication: atrial fibrillation  Allergies  Allergen Reactions  . Ace Inhibitors Cough    Enalapril  benicor    . Crestor (Rosuvastatin Calcium)     Fatigue and leg pain   . Lipitor (Atorvastatin Calcium)     Myalgias   . Niaspan (Niacin)     Intol   . Olmesartan Cough  . Prednisone     REACTION: "retained fluid"    Patient Measurements: Height: 5\' 4"  (162.6 cm) Weight: 154 lb 15.7 oz (70.3 kg) IBW/kg (Calculated) : 54.7 Vital Signs: Temp: 98 F (36.7 C) (04/17 0550) Temp src: Oral (04/17 0550) BP: 133/74 mmHg (04/17 1042) Pulse Rate: 80  (04/17 1042)  Labs:  Alexandria [name] 10/13/11 0625 10/12/11 1205 10/12/11 0550 10/11/11 1229 10/11/11 0545 10/10/11 2201 10/10/11 1636 10/10/11 1635  HGB -- -- -- -- -- -- 12.4 --  HCT -- -- -- -- -- -- 37.7 --  PLT -- -- -- -- -- -- 187 --  APTT -- -- -- -- -- -- -- --  LABPROT 26.7* -- 26.6* -- 27.5* -- -- --  INR 2.42* -- 2.41* -- 2.51* -- -- --  HEPARINUNFRC -- -- -- -- -- -- -- --  CREATININE 0.59 0.48* -- 0.53 -- -- -- --  CKTOTAL -- -- -- -- 25 30 -- 23  CKMB -- -- -- -- 2.7 2.9 -- 2.8  TROPONINI -- -- -- -- <0.30 <0.30 -- <0.30   Estimated Creatinine Clearance: 54.8 ml/min (by C-G formula based on Cr of 0.59).  Medical History: Past Medical History  Diagnosis Date  . Hypertension     with h/o hypertensive urgency  . Glaucoma   . Tremor, essential   . Diabetes mellitus with neuropathy   . B12 deficiency   . Hyperlipidemia   . Atrial fibrillation     a. on chronic coumadin;  b. Tiksosyn adjusted to 250 mcg bid 08/2011 admission  . Chronic diastolic heart failure     echo 05/2009: mild LVH, EF 55-60%, grade 2 diast dysfxn, mild LAE, PASP  . CAD (coronary artery disease)     a.  LHC 06/2009: pLAD 40%, prox-mid D1 80% (small);  CFX 40-50%, mRCA 20%, EF 65%  -  med Rx;  b.  05/23/11 Myoview - EF 53%, no isch/inf;  c. 07/2011 NSTEMI - PCI/DES LCX -  Resolute stent  . Carotid stenosis     dopplers 6/12: 1-39% bilat ICA  . Pulmonary fibrosis     Dr. Vassie Loll;  patient taken off O2 in 8/12; dyspnea felt CHF>>ILD  . Skin cancer   . Warfarin anticoagulation   . Hypomagnesemia     Medications:  Prescriptions prior to admission  Medication Sig Dispense Refill  . Cholecalciferol (VITAMIN D) 1000 UNITS capsule Take 1,000 Units by mouth daily. OTC        . clopidogrel (PLAVIX) 75 MG tablet Take 75 mg by mouth daily.      . colesevelam (WELCHOL) 625 MG tablet Take 1,250 mg by mouth daily.       . Cyanocobalamin (VITAMIN B-12 IJ) Inject 1 application as directed every 30 (thirty) days. Given at the doctor's office at the end of each month      . digoxin (LANOXIN) 0.125 MG tablet Take 125 mcg by mouth daily.      Marland Kitchen diltiazem (CARDIZEM CD) 240 MG 24 hr capsule Take 240 mg by mouth daily.      Marland Kitchen  dofetilide (TIKOSYN) 250 MCG capsule Take 1 capsule (250 mcg total) by mouth 2 (two) times daily.      . fish oil-omega-3 fatty acids 1000 MG capsule Take 1 g by mouth daily.      . furosemide (LASIX) 40 MG tablet Take 1 tablet (40 mg total) by mouth daily.  30 tablet  11  . glimepiride (AMARYL) 4 MG tablet Take 4 mg by mouth Twice daily.       . isosorbide mononitrate (IMDUR) 30 MG 24 hr tablet Take 30 mg by mouth daily.      Marland Kitchen latanoprost (XALATAN) 0.005 % ophthalmic solution Place 1 drop into both eyes at bedtime.       . magnesium oxide (MAG-OX) 400 MG tablet Take 1 tablet (400 mg total) by mouth 3 (three) times daily.  90 tablet  11  . metFORMIN (GLUCOPHAGE) 1000 MG tablet Take 1,000 mg by mouth 2 (two) times daily with a meal.      . metoprolol (LOPRESSOR) 50 MG tablet Take 1 tablet (50 mg total) by mouth 3 (three) times daily.  90 tablet  11  . nitroGLYCERIN (NITROSTAT) 0.4 MG SL tablet Place 0.4 mg under the tongue every 5 (five) minutes as needed. As needed for chest pain      . NOVOLOG MIX 70/30 FLEXPEN (70-30) 100 UNIT/ML injection Inject 20-34  Units into the skin Twice daily. 34 units in the morning & 20 units in the evening      . potassium chloride SA (K-DUR,KLOR-CON) 20 MEQ tablet Take 40 mEq by mouth 2 (two) times daily.      Marland Kitchen warfarin (COUMADIN) 5 MG tablet Take 5-10 mg by mouth daily. Alternates days and takes 10 mg on Tuesdays, Thursdays & Saturday.  & 5 mg other days of the week.        Assessment: Alexandria [name] is a 78 y.o. female with a history of paroxysmal atrial fibrillation, chronic Coumadin therapy, CAD, status post DES to the circumflex and 2/13 in the setting of NSTEMI, chronic diastolic heart failure, diabetes, hypertension, hyperlipidemia, carotid stenosis, pulmonary fibrosis. Admitted x 2 with afib last month. Her INR is therapeutic on home regimen.  No bleeding noted.   Goal of Therapy:  INR 2-3   Plan:  Coumadin 5mg  today per home dosing regimen and f/u daily protime.  Verlene Mayer, PharmD, BCPS Pager [phone] 10/13/2011,1:55 PM

## 2011-10-13 NOTE — Progress Notes (Signed)
UR Completed. Simmons, Eudelia Hiltunen F [phone]  

## 2011-10-14 LAB — BASIC METABOLIC PANEL
BUN: 20 mg/dL (ref 6–23)
Chloride: 102 mEq/L (ref 96–112)
GFR calc non Af Amer: 83 mL/min — ABNORMAL LOW (ref >90)
Glucose, Bld: 157 mg/dL — ABNORMAL HIGH (ref 70–99)
Potassium: 4.4 mEq/L (ref 3.5–5.1)
Sodium: 136 mEq/L (ref 135–145)

## 2011-10-14 LAB — GLUCOSE, CAPILLARY
Glucose-Capillary: 145 mg/dL — ABNORMAL HIGH (ref 70–99)
Glucose-Capillary: 96 mg/dL (ref 70–99)
Glucose-Capillary: 227 mg/dL — ABNORMAL HIGH (ref 70–99)
Glucose-Capillary: 86 mg/dL (ref 70–99)

## 2011-10-14 NOTE — Progress Notes (Signed)
Called by RN to assess pt since ECG read as STEMI. Pt is A&O, no chest pain, no SOB. She is receiving Tikosyn and maintaining SR.   ECGs reviewed, she has some small inferior ST changes of unclear significance. MD to review, RN faxing ECGs over. Pt is asymptomatic. Follow.

## 2011-10-14 NOTE — Progress Notes (Signed)
Inpatient Diabetes Program Recommendations  AACE/ADA: New Consensus Statement on Inpatient Glycemic Control (2009)  Target Ranges:  Prepandial:   less than 140 mg/dL      Peak postprandial:   less than 180 mg/dL (1-2 hours)      Critically ill patients:  140 - 180 mg/dL   Reason for Visit: Results for KEMBA, HOPPES (MRN [id number]) as of 10/14/2011 10:34  Ref. Range 10/13/2011 11:11 10/13/2011 16:42 10/13/2011 17:43 10/13/2011 21:28 10/14/2011 06:23  Glucose-Capillary Latest Range: 70-99 mg/dL 784 (H) 67 (L) 696 (H) 144 (H) 145 (H)    Inpatient Diabetes Program Recommendations Insulin - Basal: . Correction (SSI): Consider reducing correction Novolog scale to moderate tid with meals.  Note: Will follow.

## 2011-10-14 NOTE — Progress Notes (Signed)
SUBJECTIVE:  No chest pain.  No SOB.  No palpitations.  She was complaining of dysuria.   PHYSICAL EXAM Filed Vitals:   10/13/11 1042 10/13/11 1407 10/13/11 2123 10/14/11 0540  BP: 133/74 133/67 129/66 171/68  Pulse: 80 60 69 62  Temp:  98.1 F (36.7 C) 97.9 F (36.6 C) 98.1 F (36.7 C)  TempSrc:  Oral Oral Oral  Resp:  18 18 17   Height:      Weight:    70.9 kg (156 lb 4.9 oz)  SpO2:  94% 97% 94%   General:  No distress Lungs:  Clear Heart:  RRR Abdomen:  Positive bowel sounds, no rebound no guarding Extremities:  No edema  LABS: Lab Results  Component Value Date   CKTOTAL 25 10/11/2011   CKMB 2.7 10/11/2011   TROPONINI <0.30 10/11/2011   Results for orders placed during the hospital encounter of 10/10/11 (from the past 24 hour(s))  GLUCOSE, CAPILLARY     Status: Abnormal   Collection Time   10/13/11 11:11 AM      Component Value Range   Glucose-Capillary 193 (*) 70 - 99 (mg/dL)  URINALYSIS, ROUTINE W REFLEX MICROSCOPIC     Status: Abnormal   Collection Time   10/13/11  3:35 PM      Component Value Range   Color, Urine YELLOW  YELLOW    APPearance HAZY (*) CLEAR    Specific Gravity, Urine 1.017  1.005 - 1.030    pH 7.5  5.0 - 8.0    Glucose, UA NEGATIVE  NEGATIVE (mg/dL)   Hgb urine dipstick TRACE (*) NEGATIVE    Bilirubin Urine NEGATIVE  NEGATIVE    Ketones, ur NEGATIVE  NEGATIVE (mg/dL)   Protein, ur NEGATIVE  NEGATIVE (mg/dL)   Urobilinogen, UA 0.2  0.0 - 1.0 (mg/dL)   Nitrite NEGATIVE  NEGATIVE    Leukocytes, UA LARGE (*) NEGATIVE   URINE MICROSCOPIC-ADD ON     Status: Abnormal   Collection Time   10/13/11  3:35 PM      Component Value Range   Squamous Epithelial / LPF FEW (*) RARE    WBC, UA TOO NUMEROUS TO COUNT  <3 (WBC/hpf)   RBC / HPF 0-2  <3 (RBC/hpf)   Bacteria, UA FEW (*) RARE   GLUCOSE, CAPILLARY     Status: Abnormal   Collection Time   10/13/11  4:42 PM      Component Value Range   Glucose-Capillary 67 (*) 70 - 99 (mg/dL)  GLUCOSE,  CAPILLARY     Status: Abnormal   Collection Time   10/13/11  5:43 PM      Component Value Range   Glucose-Capillary 113 (*) 70 - 99 (mg/dL)  GLUCOSE, CAPILLARY     Status: Abnormal   Collection Time   10/13/11  9:28 PM      Component Value Range   Glucose-Capillary 144 (*) 70 - 99 (mg/dL)   Comment 1 Notify RN    PROTIME-INR     Status: Abnormal   Collection Time   10/14/11  5:30 AM      Component Value Range   Prothrombin Time 24.3 (*) 11.6 - 15.2 (seconds)   INR 2.14 (*) 0.00 - 1.49   BASIC METABOLIC PANEL     Status: Abnormal   Collection Time   10/14/11  5:30 AM      Component Value Range   Sodium 136  135 - 145 (mEq/L)   Potassium 4.4  3.5 -  5.1 (mEq/L)   Chloride 102  96 - 112 (mEq/L)   CO2 27  19 - 32 (mEq/L)   Glucose, Bld 157 (*) 70 - 99 (mg/dL)   BUN 20  6 - 23 (mg/dL)   Creatinine, Ser 1.61  0.50 - 1.10 (mg/dL)   Calcium 09.6  8.4 - 10.5 (mg/dL)   GFR calc non Af Amer 83 (*) >90 (mL/min)   GFR calc Af Amer >90  >90 (mL/min)  GLUCOSE, CAPILLARY     Status: Abnormal   Collection Time   10/14/11  6:23 AM      Component Value Range   Glucose-Capillary 145 (*) 70 - 99 (mg/dL)   Comment 1 Notify RN      Intake/Output Summary (Last 24 hours) at 10/14/11 0843 Last data filed at 10/13/11 1700  Gross per 24 hour  Intake    840 ml  Output    301 ml  Net    539 ml    ASSESSMENT AND PLAN:  1) Hyperkalemia:  Resolved.     2) HYPERTENSION:  BP still up slightly.  I will add a low dose of Norvasc.     3) CAD: On Plavix    4) Atrial fibrillation:  Restarting Tikosyn.  She will need to be here again through 6 doses.  (4/6 given as of this am.)  She remains on warfarin and in NSR.  I have reviewed an EKG done this am.  The QTc was approximately 440.  There are no acute ST T wave changes.      5) Junctional bradycardia:  Resolved    6)  UTI:  Day number 2/7 of Cipro    [name] 10/14/2011 8:43 AM

## 2011-10-14 NOTE — Progress Notes (Signed)
10/14/2011 0930 Nursing note:  Upon assessment of ekg prior to tykosyn administration, ST segment elevation noted in several leads( II, III, V3, V4). Rhonda Barrett at bedside to see patient. No complaints of chest pain VSS. Bp 126/66 HR 67 sinus rhythm. QTC noted at 435. EKG faxed to MD for review as well. Will continue to closely monitor patient.  Houa Nie, Blanchard Kelch

## 2011-10-14 NOTE — Progress Notes (Signed)
ANTICOAGULATION CONSULT NOTE - Follow-Up Consult  Pharmacy Consult for warfarin Indication: atrial fibrillation  Allergies  Allergen Reactions  . Ace Inhibitors Cough    Enalapril  benicor    . Crestor (Rosuvastatin Calcium)     Fatigue and leg pain   . Lipitor (Atorvastatin Calcium)     Myalgias   . Niaspan (Niacin)     Intol   . Olmesartan Cough  . Prednisone     REACTION: "retained fluid"    Patient Measurements: Height: 5\' 4"  (162.6 cm) Weight: 156 lb 4.9 oz (70.9 kg) (bed scale ) IBW/kg (Calculated) : 54.7 Vital Signs: Temp: 98.1 F (36.7 C) (04/18 0540) Temp src: Oral (04/18 0540) BP: 126/76 mmHg (04/18 1035) Pulse Rate: 70  (04/18 1035)  Labs:  Basename 10/14/11 0530 10/13/11 0625 10/12/11 1205 10/12/11 0550  HGB -- -- -- --  HCT -- -- -- --  PLT -- -- -- --  APTT -- -- -- --  LABPROT 24.3* 26.7* -- 26.6*  INR 2.14* 2.42* -- 2.41*  HEPARINUNFRC -- -- -- --  CREATININE 0.64 0.59 0.48* --  CKTOTAL -- -- -- --  CKMB -- -- -- --  TROPONINI -- -- -- --   Estimated Creatinine Clearance: 55.1 ml/min (by C-G formula based on Cr of 0.64).  Medical History: Past Medical History  Diagnosis Date  . Hypertension     with h/o hypertensive urgency  . Glaucoma   . Tremor, essential   . Diabetes mellitus with neuropathy   . B12 deficiency   . Hyperlipidemia   . Atrial fibrillation     a. on chronic coumadin;  b. Tiksosyn adjusted to 250 mcg bid 08/2011 admission  . Chronic diastolic heart failure     echo 05/2009: mild LVH, EF 55-60%, grade 2 diast dysfxn, mild LAE, PASP  . CAD (coronary artery disease)     a.  LHC 06/2009: pLAD 40%, prox-mid D1 80% (small);  CFX 40-50%, mRCA 20%, EF 65%  -  med Rx;  b.  05/23/11 Myoview - EF 53%, no isch/inf;  c. 07/2011 NSTEMI - PCI/DES LCX - Resolute stent  . Carotid stenosis     dopplers 6/12: 1-39% bilat ICA  . Pulmonary fibrosis     Dr. Vassie Loll;  patient taken off O2 in 8/12; dyspnea felt CHF>>ILD  . Skin cancer   .  Warfarin anticoagulation   . Hypomagnesemia     Medications:  Prescriptions prior to admission  Medication Sig Dispense Refill  . Cholecalciferol (VITAMIN D) 1000 UNITS capsule Take 1,000 Units by mouth daily. OTC        . clopidogrel (PLAVIX) 75 MG tablet Take 75 mg by mouth daily.      . colesevelam (WELCHOL) 625 MG tablet Take 1,250 mg by mouth daily.       . Cyanocobalamin (VITAMIN B-12 IJ) Inject 1 application as directed every 30 (thirty) days. Given at the doctor's office at the end of each month      . digoxin (LANOXIN) 0.125 MG tablet Take 125 mcg by mouth daily.      Marland Kitchen diltiazem (CARDIZEM CD) 240 MG 24 hr capsule Take 240 mg by mouth daily.      Marland Kitchen dofetilide (TIKOSYN) 250 MCG capsule Take 1 capsule (250 mcg total) by mouth 2 (two) times daily.      . fish oil-omega-3 fatty acids 1000 MG capsule Take 1 g by mouth daily.      . furosemide (LASIX) 40 MG tablet Take  1 tablet (40 mg total) by mouth daily.  30 tablet  11  . glimepiride (AMARYL) 4 MG tablet Take 4 mg by mouth Twice daily.       . isosorbide mononitrate (IMDUR) 30 MG 24 hr tablet Take 30 mg by mouth daily.      Marland Kitchen latanoprost (XALATAN) 0.005 % ophthalmic solution Place 1 drop into both eyes at bedtime.       . magnesium oxide (MAG-OX) 400 MG tablet Take 1 tablet (400 mg total) by mouth 3 (three) times daily.  90 tablet  11  . metFORMIN (GLUCOPHAGE) 1000 MG tablet Take 1,000 mg by mouth 2 (two) times daily with a meal.      . metoprolol (LOPRESSOR) 50 MG tablet Take 1 tablet (50 mg total) by mouth 3 (three) times daily.  90 tablet  11  . nitroGLYCERIN (NITROSTAT) 0.4 MG SL tablet Place 0.4 mg under the tongue every 5 (five) minutes as needed. As needed for chest pain      . NOVOLOG MIX 70/30 FLEXPEN (70-30) 100 UNIT/ML injection Inject 20-34 Units into the skin Twice daily. 34 units in the morning & 20 units in the evening      . potassium chloride SA (K-DUR,KLOR-CON) 20 MEQ tablet Take 40 mEq by mouth 2 (two) times daily.       Marland Kitchen warfarin (COUMADIN) 5 MG tablet Take 5-10 mg by mouth daily. Alternates days and takes 10 mg on Tuesdays, Thursdays & Saturday.  & 5 mg other days of the week.        Assessment: Alexandria [name] is a 78 y.o. female with a history of paroxysmal atrial fibrillation, chronic Coumadin therapy, CAD, status post DES to the circumflex and 2/13 in the setting of NSTEMI, chronic diastolic heart failure, diabetes, hypertension, hyperlipidemia, carotid stenosis, pulmonary fibrosis. Admitted for Tikosyn initiation. INR remains therapeutic.    Goal of Therapy:  INR 2-3   Plan:  Cont coumadin 5mg  qday except 10mg  TTSat IF INR stable tomorrow, will change INR to MWF

## 2011-10-15 LAB — BASIC METABOLIC PANEL
CO2: 26 mEq/L (ref 19–32)
Calcium: 10.2 mg/dL (ref 8.4–10.5)
GFR calc non Af Amer: 83 mL/min — ABNORMAL LOW (ref >90)
Glucose, Bld: 245 mg/dL — ABNORMAL HIGH (ref 70–99)
Potassium: 4.3 mEq/L (ref 3.5–5.1)
Sodium: 135 mEq/L (ref 135–145)

## 2011-10-15 LAB — MAGNESIUM: Magnesium: 1.8 mg/dL (ref 1.5–2.5)

## 2011-10-15 LAB — GLUCOSE, CAPILLARY
Glucose-Capillary: 97 mg/dL (ref 70–99)
Glucose-Capillary: 171 mg/dL — ABNORMAL HIGH (ref 70–99)

## 2011-10-15 MED ORDER — WARFARIN SODIUM 5 MG PO TABS: 5.0000 mg | ORAL_TABLET | ORAL | Status: DC

## 2011-10-15 MED ORDER — CIPROFLOXACIN HCL 250 MG PO TABS: 250.0000 mg | ORAL_TABLET | Freq: Two times a day (BID) | ORAL | Status: DC

## 2011-10-15 MED ORDER — WARFARIN SODIUM 7.5 MG PO TABS
7.5000 mg | ORAL_TABLET | Freq: Once | ORAL | Status: AC
  Administered 2011-10-15: 7.5 mg via ORAL
  Filled 2011-10-15 (×2): qty 1

## 2011-10-15 MED ORDER — FUROSEMIDE 40 MG PO TABS: 20.0000 mg | ORAL_TABLET | Freq: Every day | ORAL | Status: DC

## 2011-10-15 MED ORDER — MAGNESIUM SULFATE 40 MG/ML IJ SOLN
2.0000 g | Freq: Once | INTRAMUSCULAR | Status: AC
  Administered 2011-10-15: 2 g via INTRAVENOUS
  Filled 2011-10-15: qty 50

## 2011-10-15 MED ORDER — POTASSIUM CHLORIDE CRYS ER 10 MEQ PO TBCR: 10.0000 meq | EXTENDED_RELEASE_TABLET | Freq: Every day | ORAL | Status: DC

## 2011-10-15 MED ORDER — CIPROFLOXACIN HCL 250 MG PO TABS: 250.0000 mg | ORAL_TABLET | Freq: Two times a day (BID) | ORAL | Status: AC

## 2011-10-15 NOTE — Progress Notes (Signed)
Discharge order received for patient. Reviewed discharge instructions with patient and her brother. She verbalized understanding of all instructions.Copy of instructions given, IV removed.

## 2011-10-15 NOTE — Progress Notes (Signed)
Reviewed labs - Mg was low at 1.8. She has been on Mag 400 TID since admit. Mg increased from 1.7 -> 1.8. Will supp with 2 Gm IV and cont TID Rx, recheck next week.

## 2011-10-15 NOTE — Progress Notes (Signed)
    SUBJECTIVE:  No chest pain.  No SOB.  No palpitations.  She was complaining of dysuria.   PHYSICAL EXAM Filed Vitals:   10/14/11 2013 10/14/11 2053 10/15/11 0500 10/15/11 0608  BP: 149/72 131/77 181/65 142/60  Pulse: 64 87 62   Temp: 98.4 F (36.9 C) 99.3 F (37.4 C) 97.3 F (36.3 C)   TempSrc: Oral Oral Oral   Resp: 16 16 16    Height:      Weight:   156 lb 3.2 oz (70.852 kg)   SpO2: 95% 98% 97%    General:  No distress Lungs:  Clear Heart:  RRR Abdomen:  Positive bowel sounds, no rebound no guarding Extremities:  No edema  LABS: Lab Results  Component Value Date   CKTOTAL 25 10/11/2011   CKMB 2.7 10/11/2011   TROPONINI <0.30 10/11/2011   Results for orders placed during the hospital encounter of 10/10/11 (from the past 24 hour(s))  GLUCOSE, CAPILLARY     Status: Normal   Collection Time   10/14/11 11:57 AM      Component Value Range   Glucose-Capillary 96  70 - 99 (mg/dL)  GLUCOSE, CAPILLARY     Status: Abnormal   Collection Time   10/14/11  4:24 PM      Component Value Range   Glucose-Capillary 227 (*) 70 - 99 (mg/dL)  GLUCOSE, CAPILLARY     Status: Normal   Collection Time   10/14/11  8:49 PM      Component Value Range   Glucose-Capillary 86  70 - 99 (mg/dL)   Comment 1 Notify RN    PROTIME-INR     Status: Abnormal   Collection Time   10/15/11  6:10 AM      Component Value Range   Prothrombin Time 22.4 (*) 11.6 - 15.2 (seconds)   INR 1.93 (*) 0.00 - 1.49   GLUCOSE, CAPILLARY     Status: Normal   Collection Time   10/15/11  6:16 AM      Component Value Range   Glucose-Capillary 93  70 - 99 (mg/dL)    Intake/Output Summary (Last 24 hours) at 10/15/11 0709 Last data filed at 10/14/11 2030  Gross per 24 hour  Intake    960 ml  Output    700 ml  Net    260 ml    ASSESSMENT AND PLAN:  1) Atrial fibrillation:  On Tikosyn.  Vtach last night.  I will repeat a Mg this am.  I will repeat an EKG and check the QTc.  If no further arrhythmia, electrolytes OK  and QTc OK she can go home five hours after the 6th dose of Tikosyn today.     2) HYPERTENSION:  BP still up slightly.  I added a low dose of Norvasc yesterday.    3) CAD: On Plavix     6) UTI:  Day number 3/5 of Cipro    [name] 10/15/2011 7:09 AM

## 2011-10-15 NOTE — Progress Notes (Signed)
Pt had episode of 10 beats v tach non- sustained around 2056.  Pt in room sitting on bed asymptomatic without complaint.  MD on call made aware Dr. Mayford Knife, no new orders received.  Will continue to monitor.

## 2011-10-15 NOTE — Discharge Summary (Signed)
CARDIOLOGY DISCHARGE SUMMARY   Patient ID: Alexandria [name] MRN: [id number] DOB/AGE: 1929/07/11 78 y.o.  Admit date: 10/10/2011 Discharge date: 10/15/2011  Primary Discharge Diagnosis:  Atrial fibrillation Secondary Discharge Diagnosis:  Past Medical History  Diagnosis Date  . Hypertension     with h/o hypertensive urgency  . Glaucoma   . Tremor, essential   . Diabetes mellitus with neuropathy   . B12 deficiency   . Hyperlipidemia   . Atrial fibrillation     a. on chronic coumadin;  b. Tiksosyn adjusted to 250 mcg bid 08/2011 admission  . Chronic diastolic heart failure     echo 05/2009: mild LVH, EF 55-60%, grade 2 diast dysfxn, mild LAE, PASP  . CAD (coronary artery disease)     a.  LHC 06/2009: pLAD 40%, prox-mid D1 80% (small);  CFX 40-50%, mRCA 20%, EF 65%  -  med Rx;  b.  05/23/11 Myoview - EF 53%, no isch/inf;  c. 07/2011 NSTEMI - PCI/DES LCX - Resolute stent  . Carotid stenosis     dopplers 6/12: 1-39% bilat ICA  . Pulmonary fibrosis     Dr. Vassie [name];  patient taken off O2 in 8/12; dyspnea felt CHF>>ILD  . Skin cancer   . Warfarin anticoagulation   . Hypomagnesemia    Procedures: temporary transvenous pacemaker,   Hospital Course: Alexandria [name] is a 78 year old female with a history of atrial fibrillation. She developed weakness on the day of admission and her heart rate was palpated in the 40s by her son. EMS was contacted and transported her to [facility].   In the emergency room her heart rate was in the 30s. Her T waves were significantly abnormal. The temporary pacemaker was inserted. Her labs were reviewed and her initial potassium level was 7.0. Other electrolytes were within normal limits. She had problems with hypokalemia in the past and had been on potassium supplementation. This was discontinued and she was treated for hyperkalemia with insulin, glucose, sodium chloride, sodium bicarbonate and Kayexalate. Once her electrolytes were stabilized, her beta blocker and Tikosyn  were resumed. She became hypokalemic and required potassium supplementation. She had received significant amounts of IV fluids and was felt to have some volume overload so she was given IV Lasix. Her Coumadin level was therapeutic on admission and this was continued. Once her volume status was stabilized, her blood pressure was still up slightly so Norvasc was added to her medication regimen. Her magnesium was continued at her home dose but she still had a low magnesium level so additional IV supplementation was ordered. She was diagnosed with a UTI and will complete 7 days of Cipro as an outpatient. Her INR will have to be closely monitored on this med.  On 4/19, Alexandria [name] was seen by Dr Alexandria [name]. She had some NSVT but her Magnesium was low and additional supplementation was ordered. Her potassium was stable and > 4.0. Her volume status was improved and her ambulation was at baseline. She is considered stable for discharge, to follow up as an outpatient.  Labs:   Lab Results  Component Value Date   WBC 9.0 10/10/2011   HGB 12.4 10/10/2011   HCT 37.7 10/10/2011   MCV 87.1 10/10/2011   PLT 187 10/10/2011    Lab 10/15/11 0855 10/10/11 1636  NA 135 --  K 4.3 --  CL 100 --  CO2 26 --  BUN 16 --  CREATININE 0.63 --  CALCIUM 10.2 --  PROT -- 7.2  BILITOT -- 0.6  ALKPHOS -- 88  ALT -- 16  AST -- 17  GLUCOSE 245* --    Basename 10/15/11 0610  INR 1.93*      Radiology:  Dg Chest Port 1 View 10/11/2011  *RADIOLOGY REPORT*  Clinical Data: Check transvenous pacer placement.  PORTABLE CHEST - 1 VIEW  Comparison: Chest radiograph performed 10/10/2011  Findings: A right IJ transvenous pacer is noted ending overlying the right ventricle, in expected position.  There is stable elevation of the right hemidiaphragm.  The lungs remain clear bilaterally.  No focal consolidation, pleural effusion or pneumothorax is seen.  The cardiomediastinal silhouette remains borderline normal in size. A calcified  granuloma is noted overlying the left lung base.  No acute osseous abnormalities are identified.  IMPRESSION:  1.  Right IJ transvenous pacer noted ending overlying the right ventricle, in expected position. 2.  No acute cardiopulmonary process seen.  Original Report Authenticated By: Alexandria [name], M.D.   Dg Chest Port 1 View 10/10/2011  *RADIOLOGY REPORT*  Clinical Data: 78 year old female with complete heart block, weakness and dizziness.  Transvenous pacemaker placement.  PORTABLE CHEST - 1 VIEW  Comparison: 09/12/2011  Findings: Cardiomegaly is again identified. Pulmonary vascular congestion and interstitial edema now noted. A right IJ pacing wire noted with tip overlying the right ventricle. There is no evidence of pneumothorax. Elevation of the right hemidiaphragm is again noted.  IMPRESSION: Right internal jugular pacing wire with tip overlying the right ventricle.  No evidence of pneumothorax.  Mild interstitial pulmonary edema.  Original Report Authenticated By: Rosendo Gros, M.D.   EKG: 05-Oct-2011 00:24:28  Sinus rhythm with Premature supraventricular complexes Anterior infarct , age undetermined Abnormal ECG Vent. rate 61 BPM PR interval 196 Alexandria QRS duration 82 Alexandria QT/QTc 450/453 Alexandria P-R-T axes 101 71 97  FOLLOW UP PLANS AND APPOINTMENTS Discharge Orders    Future Appointments: Provider: Department: Dept Phone: Center:   11/24/2011 11:30 AM Alexandria Rotunda, MD [facility] [phone] [facility]  Check INR/BMET/Mg on Tuesday - prescription given.   Allergies  Allergen Reactions  . Ace Inhibitors Cough    Enalapril  benicor    . Crestor (Rosuvastatin Calcium)     Fatigue and leg pain   . Lipitor (Atorvastatin Calcium)     Myalgias   . Niaspan (Niacin)     Intol   . Olmesartan Cough  . Prednisone     REACTION: "retained fluid"   Medication List  As of 10/15/2011  5:41 PM   STOP taking these medications         digoxin 0.125 MG tablet      diltiazem 240 MG 24 hr  capsule         TAKE these medications         ciprofloxacin 250 MG tablet   Commonly known as: CIPRO   Take 1 tablet (250 mg total) by mouth 2 (two) times daily.      clopidogrel 75 MG tablet   Commonly known as: PLAVIX   Take 75 mg by mouth daily.      colesevelam 625 MG tablet   Commonly known as: WELCHOL   Take 1,250 mg by mouth daily.      dofetilide 250 MCG capsule   Commonly known as: TIKOSYN   Take 1 capsule (250 mcg total) by mouth 2 (two) times daily.      fish oil-omega-3 fatty acids 1000 MG capsule   Take 1 g by mouth daily.      furosemide 40  MG tablet   Commonly known as: LASIX   Take 0.5 tablets (20 mg total) by mouth daily.      glimepiride 4 MG tablet   Commonly known as: AMARYL   Take 4 mg by mouth Twice daily.      isosorbide mononitrate 30 MG 24 hr tablet   Commonly known as: IMDUR   Take 30 mg by mouth daily.      latanoprost 0.005 % ophthalmic solution   Commonly known as: XALATAN   Place 1 drop into both eyes at bedtime.      magnesium oxide 400 MG tablet   Commonly known as: MAG-OX   Take 1 tablet (400 mg total) by mouth 3 (three) times daily.      metFORMIN 1000 MG tablet   Commonly known as: GLUCOPHAGE   Take 1,000 mg by mouth 2 (two) times daily with a meal.      metoprolol 50 MG tablet   Commonly known as: LOPRESSOR   Take 1 tablet (50 mg total) by mouth 3 (three) times daily.      nitroGLYCERIN 0.4 MG SL tablet   Commonly known as: NITROSTAT   Place 0.4 mg under the tongue every 5 (five) minutes as needed. As needed for chest pain      NOVOLOG MIX 70/30 FLEXPEN (70-30) 100 UNIT/ML injection   Generic drug: insulin aspart protamine-insulin aspart   Inject 20-34 Units into the skin Twice daily. 34 units in the morning & 20 units in the evening      potassium chloride 10 MEQ tablet   Commonly known as: K-DUR,KLOR-CON   Take 1 tablet (10 mEq total) by mouth daily.      VITAMIN B-12 IJ   Inject 1 application as directed every  30 (thirty) days. Given at the doctor's office at the end of each month      Vitamin D 1000 UNITS capsule   Take 1,000 Units by mouth daily. OTC        warfarin 5 MG tablet   Commonly known as: COUMADIN   Take 5-10 mg by mouth daily. Alternates days and takes 10 mg on Tuesdays, Thursdays & Saturday.  & 5 mg other days of the week.            Follow-up Information    Follow up with [name], MD .         Layla Maw ALL MEDICATIONS WITH YOU TO FOLLOW UP APPOINTMENTS  Time spent with patient to include physician time: 45 min Signed: Theodore Demark 10/15/2011, 12:58 PM Co-Sign MD  Patient seen and examined.  Plan as discussed in my rounding note for today and outlined above. Fayrene Fearing [facility]  10/17/2011  5:54 AM

## 2011-10-15 NOTE — Progress Notes (Signed)
ANTICOAGULATION CONSULT NOTE - Follow-Up Consult  Pharmacy Consult for warfarin Indication: atrial fibrillation  Allergies  Allergen Reactions  . Ace Inhibitors Cough    Enalapril  benicor    . Crestor (Rosuvastatin Calcium)     Fatigue and leg pain   . Lipitor (Atorvastatin Calcium)     Myalgias   . Niaspan (Niacin)     Intol   . Olmesartan Cough  . Prednisone     REACTION: "retained fluid"    Patient Measurements: Height: 5\' 4"  (162.6 cm) Weight: 156 lb 3.2 oz (70.852 kg) IBW/kg (Calculated) : 54.7 Vital Signs: Temp: 97.3 F (36.3 C) (04/19 0500) Temp src: Oral (04/19 0500) BP: 155/79 mmHg (04/19 1122) Pulse Rate: 63  (04/19 1126)  Labs:  [name] 10/15/11 0855 10/15/11 0610 10/14/11 0530 10/13/11 0625  HGB -- -- -- --  HCT -- -- -- --  PLT -- -- -- --  APTT -- -- -- --  LABPROT -- 22.4* 24.3* 26.7*  INR -- 1.93* 2.14* 2.42*  HEPARINUNFRC -- -- -- --  CREATININE 0.63 -- 0.64 0.59  CKTOTAL -- -- -- --  CKMB -- -- -- --  TROPONINI -- -- -- --   Estimated Creatinine Clearance: 55.1 ml/min (by C-G formula based on Cr of 0.63).  Medical History: Past Medical History  Diagnosis Date  . Hypertension     with h/o hypertensive urgency  . Glaucoma   . Tremor, essential   . Diabetes mellitus with neuropathy   . B12 deficiency   . Hyperlipidemia   . Atrial fibrillation     a. on chronic coumadin;  b. Tiksosyn adjusted to 250 mcg bid 08/2011 admission  . Chronic diastolic heart failure     echo 05/2009: mild LVH, EF 55-60%, grade 2 diast dysfxn, mild LAE, PASP  . CAD (coronary artery disease)     a.  LHC 06/2009: pLAD 40%, prox-mid D1 80% (small);  CFX 40-50%, mRCA 20%, EF 65%  -  med Rx;  b.  05/23/11 Myoview - EF 53%, no isch/inf;  c. 07/2011 NSTEMI - PCI/DES LCX - Resolute stent  . Carotid stenosis     dopplers 6/12: 1-39% bilat ICA  . Pulmonary fibrosis     Dr. Vassie Loll;  patient taken off O2 in 8/12; dyspnea felt CHF>>ILD  . Skin cancer   . Warfarin  anticoagulation   . Hypomagnesemia     Medications:  Prescriptions prior to admission  Medication Sig Dispense Refill  . Cholecalciferol (VITAMIN D) 1000 UNITS capsule Take 1,000 Units by mouth daily. OTC        . clopidogrel (PLAVIX) 75 MG tablet Take 75 mg by mouth daily.      . colesevelam (WELCHOL) 625 MG tablet Take 1,250 mg by mouth daily.       . Cyanocobalamin (VITAMIN B-12 IJ) Inject 1 application as directed every 30 (thirty) days. Given at the doctor's office at the end of each month      . digoxin (LANOXIN) 0.125 MG tablet Take 125 mcg by mouth daily.      Marland Kitchen diltiazem (CARDIZEM CD) 240 MG 24 hr capsule Take 240 mg by mouth daily.      Marland Kitchen dofetilide (TIKOSYN) 250 MCG capsule Take 1 capsule (250 mcg total) by mouth 2 (two) times daily.      . fish oil-omega-3 fatty acids 1000 MG capsule Take 1 g by mouth daily.      . furosemide (LASIX) 40 MG tablet Take 1 tablet (40  mg total) by mouth daily.  30 tablet  11  . glimepiride (AMARYL) 4 MG tablet Take 4 mg by mouth Twice daily.       . isosorbide mononitrate (IMDUR) 30 MG 24 hr tablet Take 30 mg by mouth daily.      Marland Kitchen latanoprost (XALATAN) 0.005 % ophthalmic solution Place 1 drop into both eyes at bedtime.       . magnesium oxide (MAG-OX) 400 MG tablet Take 1 tablet (400 mg total) by mouth 3 (three) times daily.  90 tablet  11  . metFORMIN (GLUCOPHAGE) 1000 MG tablet Take 1,000 mg by mouth 2 (two) times daily with a meal.      . metoprolol (LOPRESSOR) 50 MG tablet Take 1 tablet (50 mg total) by mouth 3 (three) times daily.  90 tablet  11  . nitroGLYCERIN (NITROSTAT) 0.4 MG SL tablet Place 0.4 mg under the tongue every 5 (five) minutes as needed. As needed for chest pain      . NOVOLOG MIX 70/30 FLEXPEN (70-30) 100 UNIT/ML injection Inject 20-34 Units into the skin Twice daily. 34 units in the morning & 20 units in the evening      . potassium chloride SA (K-DUR,KLOR-CON) 20 MEQ tablet Take 40 mEq by mouth 2 (two) times daily.      Marland Kitchen  warfarin (COUMADIN) 5 MG tablet Take 5-10 mg by mouth daily. Alternates days and takes 10 mg on Tuesdays, Thursdays & Saturday.  & 5 mg other days of the week.        Assessment: 78 y.o. female with h/o PAF, chronic Coumadin therapy, CAD, status post DES to the circumflex and 2/13 in the setting of NSTEMI, chronic diastolic heart failure, DM, HTN, hyperlipidemia, carotid stenosis, pulmonary fibrosis. INR has been therapeutic but decreased today to 1.93. (<--2.14<--2.42 since 4/17).  We have continued patient on her PTA dose of 10mg  qTTSat alternating with 5mg  QMWFSun. Received 10mg  dose yesterday. No bleeding reported. No CP or SOB or palpitations. Day #3/5 of Cipro   Goal of Therapy:  INR 2-3   Plan: Give Coumadin 7.5 mg x 1 today then anticipate resuming home dose  5mg  qSunMWF alternating with  10mg  qTTSat.  INR qAM  Noah Delaine, RPh 10/15/2011 13:17

## 2011-10-27 ENCOUNTER — Encounter (INDEPENDENT_AMBULATORY_CARE_PROVIDER_SITE_OTHER): Payer: Medicare Other | Admitting: Ophthalmology

## 2011-10-27 DIAGNOSIS — E11359 Type 2 diabetes mellitus with proliferative diabetic retinopathy without macular edema: Secondary | ICD-10-CM

## 2011-10-27 DIAGNOSIS — E1139 Type 2 diabetes mellitus with other diabetic ophthalmic complication: Secondary | ICD-10-CM

## 2011-10-27 DIAGNOSIS — H43819 Vitreous degeneration, unspecified eye: Secondary | ICD-10-CM

## 2011-10-27 DIAGNOSIS — E11311 Type 2 diabetes mellitus with unspecified diabetic retinopathy with macular edema: Secondary | ICD-10-CM

## 2011-11-03 ENCOUNTER — Encounter: Payer: Self-pay | Admitting: Cardiology

## 2011-11-03 ENCOUNTER — Ambulatory Visit (INDEPENDENT_AMBULATORY_CARE_PROVIDER_SITE_OTHER): Payer: Medicare Other | Admitting: Cardiology

## 2011-11-03 VITALS — BP 160/90 | HR 60 | Ht 64.0 in | Wt 163.0 lb

## 2011-11-03 DIAGNOSIS — I10 Essential (primary) hypertension: Secondary | ICD-10-CM

## 2011-11-03 DIAGNOSIS — E875 Hyperkalemia: Secondary | ICD-10-CM

## 2011-11-03 DIAGNOSIS — I214 Non-ST elevation (NSTEMI) myocardial infarction: Secondary | ICD-10-CM

## 2011-11-03 DIAGNOSIS — I4891 Unspecified atrial fibrillation: Secondary | ICD-10-CM

## 2011-11-03 NOTE — Assessment & Plan Note (Signed)
I have reviewed her labs her potassium is normal. She will continue the medications as listed.

## 2011-11-03 NOTE — Progress Notes (Signed)
HPI The patient presents for followup of her arrhythmias and coronary disease. She was most recently hospitalized with hyperkalemia and complete heart block corrected with her hyperkalemia resolution. She has since had followup labs and have been fine. She's been doing relatively well. He's not been having any lightheadedness or palpitations that she was. However, she has not been doing very much. She is eating better.  She's not having new shortness of breath, PND or orthopnea. She has had some discomfort starting in her hands going up her wrists on both sides.  This is sporadic and happens at night.  This is not like any discomfort she's had previously.  Allergies  Allergen Reactions  . Ace Inhibitors Cough    Enalapril  benicor    . Crestor (Rosuvastatin Calcium)     Fatigue and leg pain   . Lipitor (Atorvastatin Calcium)     Myalgias   . Niaspan (Niacin)     Intol   . Olmesartan Cough  . Prednisone     REACTION: "retained fluid"    Current Outpatient Prescriptions  Medication Sig Dispense Refill  . Cholecalciferol (VITAMIN D) 1000 UNITS capsule Take 1,000 Units by mouth daily. OTC        . clopidogrel (PLAVIX) 75 MG tablet Take 75 mg by mouth daily.      . colesevelam (WELCHOL) 625 MG tablet Take 1,250 mg by mouth daily.       . Cyanocobalamin (VITAMIN B-12 IJ) Inject 1 application as directed every 30 (thirty) days. Given at the doctor's office at the end of each month      . dofetilide (TIKOSYN) 250 MCG capsule Take 1 capsule (250 mcg total) by mouth 2 (two) times daily.      . fish oil-omega-3 fatty acids 1000 MG capsule Take 1 g by mouth daily.      . furosemide (LASIX) 40 MG tablet Take 0.5 tablets (20 mg total) by mouth daily.  30 tablet  11  . glimepiride (AMARYL) 4 MG tablet Take 4 mg by mouth Twice daily.       . isosorbide mononitrate (IMDUR) 30 MG 24 hr tablet Take 30 mg by mouth daily.      Marland Kitchen latanoprost (XALATAN) 0.005 % ophthalmic solution Place 1 drop into both  eyes at bedtime.       . magnesium oxide (MAG-OX) 400 MG tablet Take 1 tablet (400 mg total) by mouth 3 (three) times daily.  90 tablet  11  . metFORMIN (GLUCOPHAGE) 1000 MG tablet Take 1,000 mg by mouth 2 (two) times daily with a meal.      . metoprolol (LOPRESSOR) 50 MG tablet Take 1 tablet (50 mg total) by mouth 3 (three) times daily.  90 tablet  11  . nitroGLYCERIN (NITROSTAT) 0.4 MG SL tablet Place 0.4 mg under the tongue every 5 (five) minutes as needed. As needed for chest pain      . NOVOLOG MIX 70/30 FLEXPEN (70-30) 100 UNIT/ML injection Inject 20-34 Units into the skin Twice daily. 34 units in the morning & 20 units in the evening      . potassium chloride SA (K-DUR,KLOR-CON) 10 MEQ tablet Take 1 tablet (10 mEq total) by mouth daily.  30 tablet  11  . warfarin (COUMADIN) 5 MG tablet Take 5-10 mg by mouth daily. Alternates days and takes 10 mg on Tuesdays, Thursdays & Saturday.  & 5 mg other days of the week.      Marland Kitchen DISCONTD: diltiazem (TIAZAC) 420  MG 24 hr capsule Take 420 mg by mouth daily.        Past Medical History  Diagnosis Date  . Hypertension     with h/o hypertensive urgency  . Glaucoma   . Tremor, essential   . Diabetes mellitus with neuropathy   . B12 deficiency   . Hyperlipidemia   . Atrial fibrillation     a. on chronic coumadin;  b. Tiksosyn adjusted to 250 mcg bid 08/2011 admission  . Chronic diastolic heart failure     echo 05/2009: mild LVH, EF 55-60%, grade 2 diast dysfxn, mild LAE, PASP  . CAD (coronary artery disease)     a.  LHC 06/2009: pLAD 40%, prox-mid D1 80% (small);  CFX 40-50%, mRCA 20%, EF 65%  -  med Rx;  b.  05/23/11 Myoview - EF 53%, no isch/inf;  c. 07/2011 NSTEMI - PCI/DES LCX - Resolute stent  . Carotid stenosis     dopplers 6/12: 1-39% bilat ICA  . Pulmonary fibrosis     Dr. Vassie Loll;  patient taken off O2 in 8/12; dyspnea felt CHF>>ILD  . Skin cancer   . Warfarin anticoagulation   . Hypomagnesemia     Past Surgical History  Procedure Date    . Appendectomy   . Nose surgery   . Cardiac catheterization    ROS:  As stated in the HPI and negative for all other systems.  PHYSICAL EXAM BP 160/90  Pulse 60  Ht 5\' 4"  (1.626 m)  Wt 163 lb (73.936 kg)  BMI 27.98 kg/m2 GENERAL:  Well appearing HEENT:  Pupils equal round and reactive, fundi not visualized, oral mucosa unremarkable NECK:  No jugular venous distention, waveform within normal limits, carotid upstroke brisk and symmetric, no bruits, no thyromegaly LYMPHATICS:  No cervical, inguinal adenopathy LUNGS:  Clear to auscultation bilaterally BACK:  No CVA tenderness CHEST:  Unremarkable HEART:  PMI not displaced or sustained,S1 and S2 within normal limits, no S3, no S4, no clicks, no rubs, no murmurs ABD:  Flat, positive bowel sounds normal in frequency in pitch, no bruits, no rebound, no guarding, no midline pulsatile mass, no hepatomegaly, no splenomegaly EXT:  2 plus pulses throughout, no edema, no cyanosis no clubbing SKIN:  No rashes no nodules NEURO:  Cranial nerves II through XII grossly intact, motor grossly intact throughout PSYCH:  Cognitively intact, oriented to person place and time  ASSESSMENT AND PLAN

## 2011-11-03 NOTE — Assessment & Plan Note (Signed)
Her blood pressure is slightly elevated. However, she does not think it is elevated at home. She's having a little swimmy headedness.  Therefore, I won't change her medications. She will continue current medications as listed. She'll keep a blood pressure diary.

## 2011-11-03 NOTE — Assessment & Plan Note (Signed)
I reviewed her cath report. She does have small vessel disease.  However, I think her current symptoms are very atypical and not suggestive of angina. At this point I would not suggest further cardiovascular testing. She'll let me know if the symptoms change.

## 2011-11-03 NOTE — Patient Instructions (Signed)
The current medical regimen is effective;  continue present plan and medications.  Follow up in 6 weeks with Dr Hochrein. 

## 2011-11-03 NOTE — Assessment & Plan Note (Signed)
I think she's maintaining sinus rhythm. At this point he will continue the medications as listed.

## 2011-11-04 ENCOUNTER — Encounter (INDEPENDENT_AMBULATORY_CARE_PROVIDER_SITE_OTHER): Payer: Medicare Other | Admitting: Ophthalmology

## 2011-11-04 DIAGNOSIS — E1165 Type 2 diabetes mellitus with hyperglycemia: Secondary | ICD-10-CM

## 2011-11-04 DIAGNOSIS — H43819 Vitreous degeneration, unspecified eye: Secondary | ICD-10-CM

## 2011-11-04 DIAGNOSIS — E11359 Type 2 diabetes mellitus with proliferative diabetic retinopathy without macular edema: Secondary | ICD-10-CM

## 2011-11-04 DIAGNOSIS — E11311 Type 2 diabetes mellitus with unspecified diabetic retinopathy with macular edema: Secondary | ICD-10-CM

## 2011-11-16 ENCOUNTER — Emergency Department (HOSPITAL_COMMUNITY)
Admission: EM | Admit: 2011-11-16 | Discharge: 2011-11-16 | Disposition: A | Discharge status: Home / Self Care | Payer: Medicare Other | Attending: Emergency Medicine | Admitting: Emergency Medicine

## 2011-11-16 ENCOUNTER — Other Ambulatory Visit: Payer: Self-pay

## 2011-11-16 ENCOUNTER — Emergency Department (HOSPITAL_COMMUNITY): Payer: Medicare Other

## 2011-11-16 ENCOUNTER — Encounter (HOSPITAL_COMMUNITY): Payer: Self-pay | Admitting: Emergency Medicine

## 2011-11-16 DIAGNOSIS — R079 Chest pain, unspecified: Secondary | ICD-10-CM | POA: Insufficient documentation

## 2011-11-16 DIAGNOSIS — R000 Tachycardia, unspecified: Secondary | ICD-10-CM | POA: Insufficient documentation

## 2011-11-16 DIAGNOSIS — E785 Hyperlipidemia, unspecified: Secondary | ICD-10-CM | POA: Insufficient documentation

## 2011-11-16 DIAGNOSIS — I10 Essential (primary) hypertension: Secondary | ICD-10-CM | POA: Insufficient documentation

## 2011-11-16 DIAGNOSIS — J8410 Pulmonary fibrosis, unspecified: Secondary | ICD-10-CM | POA: Insufficient documentation

## 2011-11-16 DIAGNOSIS — H409 Unspecified glaucoma: Secondary | ICD-10-CM | POA: Insufficient documentation

## 2011-11-16 DIAGNOSIS — Z7901 Long term (current) use of anticoagulants: Secondary | ICD-10-CM | POA: Insufficient documentation

## 2011-11-16 DIAGNOSIS — E119 Type 2 diabetes mellitus without complications: Secondary | ICD-10-CM | POA: Insufficient documentation

## 2011-11-16 DIAGNOSIS — I4891 Unspecified atrial fibrillation: Secondary | ICD-10-CM | POA: Insufficient documentation

## 2011-11-16 DIAGNOSIS — I5032 Chronic diastolic (congestive) heart failure: Secondary | ICD-10-CM | POA: Insufficient documentation

## 2011-11-16 DIAGNOSIS — I6529 Occlusion and stenosis of unspecified carotid artery: Secondary | ICD-10-CM | POA: Insufficient documentation

## 2011-11-16 LAB — CBC
HCT: 35.8 % — ABNORMAL LOW (ref 36.0–46.0)
Hemoglobin: 11.6 g/dL — ABNORMAL LOW (ref 12.0–15.0)
MCH: 27.4 pg (ref 26.0–34.0)
MCHC: 32.4 g/dL (ref 30.0–36.0)
MCV: 84.6 fL (ref 78.0–100.0)
Platelets: 197 10*3/uL (ref 150–400)
RBC: 4.23 MIL/uL (ref 3.87–5.11)
RDW: 14.1 % (ref 11.5–15.5)
WBC: 7.2 10*3/uL (ref 4.0–10.5)

## 2011-11-16 LAB — TROPONIN I: Troponin I: <0.3 ng/mL (ref <0.30)

## 2011-11-16 LAB — BASIC METABOLIC PANEL
BUN: 13 mg/dL (ref 6–23)
CO2: 26 mEq/L (ref 19–32)
Calcium: 10.2 mg/dL (ref 8.4–10.5)
Chloride: 96 mEq/L (ref 96–112)
Creatinine, Ser: 0.61 mg/dL (ref 0.50–1.10)
GFR calc Af Amer: >90 mL/min (ref >90)
GFR calc non Af Amer: 84 mL/min — ABNORMAL LOW (ref >90)
Glucose, Bld: 296 mg/dL — ABNORMAL HIGH (ref 70–99)
Potassium: 4.5 mEq/L (ref 3.5–5.1)
Sodium: 134 mEq/L — ABNORMAL LOW (ref 135–145)

## 2011-11-16 LAB — PROTIME-INR
INR: 1.72 — ABNORMAL HIGH (ref 0.00–1.49)
Prothrombin Time: 20.5 seconds — ABNORMAL HIGH (ref 11.6–15.2)

## 2011-11-16 MED ORDER — MAGNESIUM SULFATE 40 MG/ML IJ SOLN
2.0000 g | Freq: Once | INTRAMUSCULAR | Status: AC
  Administered 2011-11-16: 2 g via INTRAVENOUS
  Filled 2011-11-16 (×2): qty 50

## 2011-11-16 MED ORDER — DILTIAZEM HCL 100 MG IV SOLR
5.0000 mg/h | Freq: Once | INTRAVENOUS | Status: DC
  Filled 2011-11-16: qty 100

## 2011-11-16 MED ORDER — DILTIAZEM HCL 25 MG/5ML IV SOLN
INTRAVENOUS | Status: AC
  Administered 2011-11-16: 10 mg
  Filled 2011-11-16: qty 5

## 2011-11-16 MED ORDER — METOPROLOL TARTRATE 100 MG PO TABS: 100.0000 mg | ORAL_TABLET | Freq: Two times a day (BID) | ORAL | Status: DC

## 2011-11-16 MED ORDER — DOFETILIDE 250 MCG PO CAPS
250.0000 ug | ORAL_CAPSULE | ORAL | Status: AC
  Administered 2011-11-16: 250 ug via ORAL
  Filled 2011-11-16: qty 1

## 2011-11-16 NOTE — Discharge Instructions (Signed)
Follow the recommendations provided by the cardiologist.  Follow up with Dr. Antoine Poche in his office in the next one to 2 days.  Return here for any worsening in your condition

## 2011-11-16 NOTE — ED Notes (Signed)
Per pt, she was informed she was going to be d/c'd home after dose of PO Tikosyn; ED PA not aware of same - cardiology paged

## 2011-11-16 NOTE — ED Notes (Addendum)
Pt reports chest pain since 1:30, pt stated she was just sitting down when the chest pain started. Pt also reports some lower back pain and numbness in both hands. Pt states she feels a little bit sob. Pt in no apparent distress.

## 2011-11-16 NOTE — ED Provider Notes (Signed)
History     CSN: [id number]  Arrival date & time 11/16/11  1550   First MD Initiated Contact with Patient 11/16/11 1551      Chief Complaint  Patient presents with  . Chest Pain    Per EMS - pt in A.fib with RVR rate of 182, pt beared down and decreased rate to 120s remains in A.fib    (Consider location/radiation/quality/duration/timing/severity/associated sxs/prior treatment) HPI Patient presents to the emergency department with chest pain that started earlier today.  Patient states that she woke up not feeling well well.  She states that nothing seemed to make her feel any better that she tried at home.  Patient states that she felt like and radiated to both arms.  She states that she has been treated by Dr. Antoine Poche for atrial fibrillation and coronary artery disease.  Patient states that she did not have any nausea, vomiting, abdominal pain, weakness, visual changes, or headache.  Patient, states she may have had some mild shortness of breath, when the pain occurred. Past Medical History  Diagnosis Date  . Hypertension     with h/o hypertensive urgency  . Glaucoma   . Tremor, essential   . Diabetes mellitus with neuropathy   . B12 deficiency   . Hyperlipidemia   . Atrial fibrillation     a. on chronic coumadin;  b. Tiksosyn adjusted to 250 mcg bid 08/2011 admission  . Chronic diastolic heart failure     echo 05/2009: mild LVH, EF 55-60%, grade 2 diast dysfxn, mild LAE, PASP  . CAD (coronary artery disease)     a.  LHC 06/2009: pLAD 40%, prox-mid D1 80% (small);  CFX 40-50%, mRCA 20%, EF 65%  -  med Rx;  b.  05/23/11 Myoview - EF 53%, no isch/inf;  c. 07/2011 NSTEMI - PCI/DES LCX - Resolute stent  . Carotid stenosis     dopplers 6/12: 1-39% bilat ICA  . Pulmonary fibrosis     Dr. Vassie Loll;  patient taken off O2 in 8/12; dyspnea felt CHF>>ILD  . Skin cancer   . Warfarin anticoagulation   . Hypomagnesemia     Past Surgical History  Procedure Date  . Appendectomy   . Nose  surgery   . Cardiac catheterization     Family History  Problem Relation Age of Onset  . Throat cancer Brother   . Prostate cancer Father   . Diabetes Mother   . Diabetes Brother     multiple  . Heart disease Brother     History  Substance Use Topics  . Smoking status: Never Smoker   . Smokeless tobacco: Never Used  . Alcohol Use: No    OB History    Grav Para Term Preterm Abortions TAB SAB Ect Mult Living                  Review of Systems All other systems negative except as documented in the HPI. All pertinent positives and negatives as reviewed in the HPI.  Allergies  Ace inhibitors; Crestor; Lipitor; Niaspan; Olmesartan; and Prednisone  Home Medications   Current Outpatient Rx  Name Route Sig Dispense Refill  . VITAMIN D 1000 UNITS PO CAPS Oral Take 1,000 Units by mouth daily. OTC      . CLOPIDOGREL BISULFATE 75 MG PO TABS Oral Take 75 mg by mouth daily.    . COLESEVELAM HCL 625 MG PO TABS Oral Take 1,250 mg by mouth daily.     Marland Kitchen VITAMIN B-12 IJ  Injection Inject 1 application as directed every 30 (thirty) days. Given at the doctor's office at the end of each month    . OMEGA-3 FATTY ACIDS 1000 MG PO CAPS Oral Take 1 g by mouth daily.    . FUROSEMIDE 40 MG PO TABS Oral Take 0.5 tablets (20 mg total) by mouth daily. 30 tablet 11  . GLIMEPIRIDE 4 MG PO TABS Oral Take 4 mg by mouth Twice daily.     . ISOSORBIDE MONONITRATE ER 30 MG PO TB24 Oral Take 30 mg by mouth daily.    Marland Kitchen LATANOPROST 0.005 % OP SOLN Both Eyes Place 1 drop into both eyes at bedtime.     Marland Kitchen MAGNESIUM OXIDE 400 MG PO TABS Oral Take 1 tablet (400 mg total) by mouth 3 (three) times daily. 90 tablet 11  . METFORMIN HCL 1000 MG PO TABS Oral Take 1,000 mg by mouth 2 (two) times daily with a meal.    . METOPROLOL TARTRATE 50 MG PO TABS Oral Take 1 tablet (50 mg total) by mouth 3 (three) times daily. 90 tablet 11  . NITROGLYCERIN 0.4 MG SL SUBL Sublingual Place 0.4 mg under the tongue every 5 (five)  minutes as needed. As needed for chest pain    . NOVOLOG MIX 70/30 FLEXPEN (70-30) 100 UNIT/ML  SUSP Subcutaneous Inject 20-34 Units into the skin Twice daily. 34 units in the morning & 20 units in the evening    . POTASSIUM CHLORIDE CRYS ER 10 MEQ PO TBCR Oral Take 1 tablet (10 mEq total) by mouth daily. 30 tablet 11  . WARFARIN SODIUM 5 MG PO TABS Oral Take 5-10 mg by mouth daily. Alternates days and takes 10 mg on Tuesdays, Thursdays & Saturday.  & 5 mg other days of the week.    . DOFETILIDE 250 MCG PO CAPS Oral Take 1 capsule (250 mcg total) by mouth 2 (two) times daily.      BP 120/62  Pulse 102  Temp 98.4 F (36.9 C)  Resp 22  SpO2 99%  Physical Exam  Constitutional: She is oriented to person, place, and time. She appears well-developed and well-nourished. No distress.  HENT:  Head: Normocephalic and atraumatic.  Mouth/Throat: Oropharynx is clear and moist.  Eyes: Pupils are equal, round, and reactive to light.  Neck: Normal range of motion. Neck supple.  Cardiovascular: Normal heart sounds.  An irregularly irregular rhythm present. Tachycardia present.  Exam reveals no gallop and no friction rub.   No murmur heard. Pulmonary/Chest: Effort normal and breath sounds normal. No respiratory distress. She has no wheezes. She has no rales.  Neurological: She is alert and oriented to person, place, and time.  Skin: Skin is warm and dry. She is not diaphoretic.    ED Course  Procedures (including critical care time)  Labs Reviewed  CBC - Abnormal; Notable for the following:    Hemoglobin 11.6 (*)    HCT 35.8 (*)    All other components within normal limits  BASIC METABOLIC PANEL - Abnormal; Notable for the following:    Sodium 134 (*)    Glucose, Bld 296 (*)    GFR calc non Af Amer 84 (*)    All other components within normal limits  PROTIME-INR - Abnormal; Notable for the following:    Prothrombin Time 20.5 (*)    INR 1.72 (*)    All other components within normal limits   TROPONIN I  MAGNESIUM  URINALYSIS, ROUTINE W REFLEX MICROSCOPIC  Dg Chest Port 1 View  11/16/2011  *RADIOLOGY REPORT*  Clinical Data: Chest pain, atrial fibrillation  PORTABLE CHEST - 1 VIEW  Comparison: 10/11/2011  Findings: Stable mild cardiomegaly.  Lungs are clear.  No effusion. Granuloma in the left lower lung.  Regional bones unremarkable.  IMPRESSION:  1.  Stable mild cardiomegaly.  No acute disease.  Original Report Authenticated By: Thora Lance III, M.D.     1. Chest pain   2. Atrial fibrillation with RVR    Patient stabilized with Cardizem and I spoke with [facility] cardiology.  They will be down see the patient for admission.   MDM  MDM Reviewed: nursing note, vitals and previous chart Interpretation: labs, ECG and x-ray Consults: cardiology   CRITICAL CARE Performed by: Carlyle Dolly   Total critical care time: 30  Critical care time was exclusive of separately billable procedures and treating other patients.  Critical care was necessary to treat or prevent imminent or life-threatening deterioration.  Critical care was time spent personally by me on the following activities: development of treatment plan with patient and/or surrogate as well as nursing, discussions with consultants, evaluation of patient's response to treatment, examination of patient, obtaining history from patient or surrogate, ordering and performing treatments and interventions, ordering and review of laboratory studies, ordering and review of radiographic studies, pulse oximetry and re-evaluation of patient's condition.    The patient received cardizem bolus and this brought her back into a sinus rhythm. I was in the room multiple times for recheck.   I spoke with Cardiology and they have adjusted some medication and felt that since she missed a medication dose that this was the cause. They feel she can go home with close outpatient follow up.     Carlyle Dolly,  PA-C 11/16/11 1827  Carlyle Dolly, PA-C 11/16/11 1916

## 2011-11-16 NOTE — ED Notes (Addendum)
Pt has converted to a NSR, rate of 90. Pt denies chest pain at this time and reports feeling a lot better.

## 2011-11-16 NOTE — ED Notes (Signed)
Per EMS- pt c/o chest pain since 1:30pm, EMS gave 4 baby Asprin and 1 Nitro.

## 2011-11-16 NOTE — H&P (Signed)
Consult Note  Patient ID: Libni Fusaro MRN: [id number], SOB: [birth year] 78 y.o. Date of Encounter: 11/16/2011, 5:00 PM  Primary Physician: [name], MD, MD Primary Cardiologist: Dr. Antoine Poche in [place]  Chief Complaint: Chest pain  HPI: 78 y.o. female w/ PMHx significant for CAD (NSTEMI s/p PCI/DES to LCx 07/2011), A.Fib (on Tikosyn and coumadin), and Chronic diastolic CHF who presented to [facility] on 11/16/2011 with complaints of chest pain.  Diagnosed with A.Fib in ~2011 and managed with coumadin and rate control. Was initiated on Tikosyn in early March 2013 for recurrent symptomatic atrial fib. Was hospitalized again in mid March with symptomatic atrial fibrillation/flutter and Tikosyn was increased to BID with close monitoring of QTc and electrolytes. She was evaluated by Dr. Johney Frame who noted amiodarone has been avoided due to pulmonary fibrosis and felt her to be high risk for catheter ablation. It was noted that if she fails therapy with Tikosyn it would be reasonable to have pulmonary see her to consider a trial of amiodarone. Echo that admission revealed mod LVH, EF 55-60%. Upon discharge she wore an event monitor with results reviewed by Dr. Antoine Poche on 4/10 who noted she remained in NSR. She was hospitalized again on 4/14 with severe bradycardia in the setting of hyperkalemia requiring temporary transvenous pacing. She was treated and discharged back on Tikosyn on 4/19. She had a follow up with Dr. Antoine Poche on 5/8 and was noted to be maintaining NSR.  She has been in her usual state of health until today when she had sudden onset arm/chest pain associated with mild sob and dizziness. She called her nephew who works for EMS and was told to call 911. She was noted to be in atrial fibrillation with rates 170-180s. She reported not taking any of her medications this morning (including Tikosyn and Lopressor) because she "didnt feel like swallowing pills". She denies chest pain  or sob with exertion, but is fairly sedentary. Denies orthopnea, edema, calf pain/swelling, prolonged immobilization/travel, abd pain, change in bladder or bowels, cough, fever, chills.   In the ED, EKG revealed A.Fib 173bpm. CXR was without acute cardiopulmonary abnormalities. Labs significant for INR 1.72, normal troponin, K+ 4.5. She received cardizem bolus with spontaneous conversion to NSR and immediate resolution of symptoms. She is now feeling well with rates in the 70s-80s.   Past Medical History  Diagnosis Date  . Hypertension     with h/o hypertensive urgency  . Glaucoma   . Tremor, essential   . Diabetes mellitus with neuropathy   . B12 deficiency   . Hyperlipidemia   . Atrial fibrillation     a. on chronic coumadin;  b. Tiksosyn adjusted to 250 mcg bid 08/2011 admission  . Chronic diastolic heart failure     echo 05/2009: mild LVH, EF 55-60%, grade 2 diast dysfxn, mild LAE, PASP  . CAD (coronary artery disease)     a.  LHC 06/2009: pLAD 40%, prox-mid D1 80% (small);  CFX 40-50%, mRCA 20%, EF 65%  -  med Rx;  b.  05/23/11 Myoview - EF 53%, no isch/inf;  c. 07/2011 NSTEMI - PCI/DES LCX - Resolute stent  . Carotid stenosis     dopplers 6/12: 1-39% bilat ICA  . Pulmonary fibrosis     Dr. Vassie Loll;  patient taken off O2 in 8/12; dyspnea felt CHF>>ILD  . Skin cancer   . Warfarin anticoagulation   . Hypomagnesemia      Surgical History:  Past Surgical History  Procedure  Date  . Appendectomy   . Nose surgery   . Cardiac catheterization      Home Meds: Medication Sig  Cholecalciferol (VITAMIN D) 1000 UNITS capsule Take 1,000 Units by mouth daily. OTC    clopidogrel (PLAVIX) 75 MG tablet Take 75 mg by mouth daily.  colesevelam (WELCHOL) 625 MG tablet Take 1,250 mg by mouth daily.   Cyanocobalamin (VITAMIN B-12 IJ) Inject 1 application as directed every 30 (thirty) days. Given at the doctor's office at the end of each month  fish oil-omega-3 fatty acids 1000 MG capsule Take 1 g  by mouth daily.  furosemide (LASIX) 40 MG tablet Take 0.5 tablets (20 mg total) by mouth daily.  glimepiride (AMARYL) 4 MG tablet Take 4 mg by mouth Twice daily.   isosorbide mononitrate (IMDUR) 30 MG 24 hr tablet Take 30 mg by mouth daily.  latanoprost (XALATAN) 0.005 % ophthalmic solution Place 1 drop into both eyes at bedtime.   magnesium oxide (MAG-OX) 400 MG tablet Take 1 tablet (400 mg total) by mouth 3 (three) times daily.  metFORMIN (GLUCOPHAGE) 1000 MG tablet Take 1,000 mg by mouth 2 (two) times daily with a meal.  metoprolol (LOPRESSOR) 50 MG tablet Take 1 tablet (50 mg total) by mouth 3 (three) times daily.  nitroGLYCERIN (NITROSTAT) 0.4 MG SL tablet Place 0.4 mg under the tongue every 5 (five) minutes as needed. As needed for chest pain  NOVOLOG MIX 70/30 FLEXPEN (70-30) 100 UNIT/ML injection Inject 20-34 Units into the skin Twice daily. 34 units in the morning & 20 units in the evening  potassium chloride SA (K-DUR,KLOR-CON) 10 MEQ tablet Take 1 tablet (10 mEq total) by mouth daily.  warfarin (COUMADIN) 5 MG tablet Take 5-10 mg by mouth daily. Alternates days and takes 10 mg on Tuesdays, Thursdays & Saturday.  & 5 mg other days of the week.  dofetilide (TIKOSYN) 250 MCG capsule Take 1 capsule (250 mcg total) by mouth 2 (two) times daily.    Allergies:  Allergies  Allergen Reactions  . Ace Inhibitors Cough    Enalapril  benicor    . Crestor (Rosuvastatin Calcium)     Fatigue and leg pain   . Lipitor (Atorvastatin Calcium)     Myalgias   . Niaspan (Niacin)     Intol   . Olmesartan Cough  . Prednisone     REACTION: "retained fluid"    History   Social History  . Marital Status: Widowed    Spouse Name: N/A    Number of Children: N/A  . Years of Education: N/A   Occupational History  . Not on file.   Social History Main Topics  . Smoking status: Never Smoker   . Smokeless tobacco: Never Used  . Alcohol Use: No  . Drug Use: No  . Sexually Active: No   Other  Topics Concern  . Not on file   Social History Narrative  . No narrative on file     Family History  Problem Relation Age of Onset  . Throat cancer Brother   . Prostate cancer Father   . Diabetes Mother   . Diabetes Brother     multiple  . Heart disease Brother     Review of Systems: General: negative for chills, fever, night sweats or weight changes.  Cardiovascular: (+) chest pain w/ shortness of breath as per HPI; negative for dyspnea on exertion, edema, orthopnea, palpitations, paroxysmal nocturnal dyspnea  Dermatological: negative for rash Respiratory: negative for cough or wheezing  Urologic: negative for hematuria Abdominal: negative for nausea, vomiting, diarrhea, bright red blood per rectum, melena, or hematemesis Neurologic: (+) dizziness as per HPI; negative for visual changes, syncope All other systems reviewed and are otherwise negative except as noted above.  Labs:   Component Value Date   WBC 7.2 11/16/2011   HGB 11.6* 11/16/2011   HCT 35.8* 11/16/2011   MCV 84.6 11/16/2011   PLT 197 11/16/2011    Radiology/Studies:   11/16/11 - CXR  Findings: Stable mild cardiomegaly. Lungs are clear. No effusion. Granuloma in the left lower lung. Regional bones unremarkable. IMPRESSION: Stable mild cardiomegaly. No acute disease.   EKG: 11/16/11 @ 1611 - Atrial Fibrillation 173bpm   Physical Exam: Blood pressure 120/62, pulse 102, temperature 98.4 F (36.9 C), resp. rate 22, SpO2 99.00%. General: Well developed, well nourished, elderly white female in no acute distress. Head: Normocephalic, atraumatic, sclera non-icteric, nares are without discharge Neck: Supple. Negative for carotid bruits. JVD not elevated. Lungs: Coarse breath sounds posteriorly. No wheezes, rales, or rhonchi. Breathing is unlabored. Heart: RRR with S1 S2. No murmurs, rubs, or gallops appreciated. Abdomen: Soft, non-tender, non-distended with normoactive bowel sounds. No rebound/guarding. No obvious  abdominal masses. Msk:  Strength and tone appear normal for age. Extremities: Trace BLE edema. No calf pain/swelling/cords. No clubbing or cyanosis. Distal pedal pulses are intact and equal bilaterally. Neuro: Alert and oriented X 3. Moves all extremities spontaneously. Psych:  Responds to questions appropriately with a normal affect.    ASSESSMENT AND PLAN:  79 y.o. female w/ PMHx significant for CAD (NSTEMI s/p PCI/DES to LCx 07/2011), A.Fib (on Tikosyn and coumadin), and Chronic diastolic CHF who presented to [facility] on 11/16/2011 with complaints of chest pain.  1. Atrial Fibrillation w/ RVR: Patient w/ h/o A.Fib initiated on Tikosyn in early March requiring uptitration for symptomatic afib in mid March and since then remained in NSR until today after missing dose of Tikosyn and metoprolol. She converted spontaneously to NSR after one bolus of IV cardizem with resolution of symptoms. Currently in sinus rhythm with rates 70-80s and feels well. Discussed importance of medication compliance. Will give one dose of Tikosyn in the ED and discharge as long as she remains in sinus rhythm. Change lopressor to 100mg  BID with consideration of switching to cardizem as an outpatient given diagnosis of pulm fibrosis. INR subtherapeutic at 1.7. She reports taking her coumadin as prescribed. Cont coumadin with close follow up of INR as outpatient. K+ WNL. Magnesium 1.7, will give 2g MgSulfate before discharge.  2. Chest pain: H/o CAD s/p PCI/DES to LCx 07/2011. Reports compliance with Plavix. Troponin normal. Chest pain resolved immediately with return to NSR. Do not suspect ACS. Cont Plavix, BB, Imdur and statin.  3. Chronic Diastolic CHF: Euvolemic on exam. Cont outpt meds.  Signed, Danayah Smyre PA-C 11/16/2011, 5:00 PM  Patient seen and examined with Berton Mount, NP. We discussed all aspects of the encounter. I agree with the assessment and plan as stated above. Patient well know to me from  previous hospitalization. Now presents with breakthrough episode of AF with RVR after missing a dose of Tikosyn. Back in NSR after diltiazem bolus. Will resume Tikosyn 205 bid. Reminded her to be compliant. Also increase lopressor to 100 bid. Given pulmonary fibrosis may benefit from switch to diltiazem in future, will leave leave to Dr. Jenene Slicker discretion.   Truman Hayward 5:39 PM

## 2011-11-16 NOTE — ED Notes (Signed)
Pt initially placed on monitor, showing a.fib rate of 120s, rate subsequently increased to 180s. MD at bedside, orders given.

## 2011-11-16 NOTE — ED Notes (Signed)
Pt given Cardizem over 3 mins. Pt's HR decreased to 103, remains in a.fib. C/o slight chest pain.

## 2011-11-17 NOTE — ED Provider Notes (Signed)
Medical screening examination/treatment/procedure(s) were conducted as a shared visit with non-physician practitioner(s) and myself.  I personally evaluated the patient during the encounter  Ethelda Chick, MD 11/17/11 1504

## 2011-11-24 ENCOUNTER — Ambulatory Visit: Payer: Medicare Other | Admitting: Cardiology

## 2011-11-24 ENCOUNTER — Encounter (INDEPENDENT_AMBULATORY_CARE_PROVIDER_SITE_OTHER): Payer: Medicare Other | Admitting: Ophthalmology

## 2011-11-24 DIAGNOSIS — H4310 Vitreous hemorrhage, unspecified eye: Secondary | ICD-10-CM

## 2011-11-24 DIAGNOSIS — H43819 Vitreous degeneration, unspecified eye: Secondary | ICD-10-CM

## 2011-11-24 DIAGNOSIS — E1165 Type 2 diabetes mellitus with hyperglycemia: Secondary | ICD-10-CM

## 2011-11-24 DIAGNOSIS — E11359 Type 2 diabetes mellitus with proliferative diabetic retinopathy without macular edema: Secondary | ICD-10-CM

## 2011-11-24 DIAGNOSIS — E11311 Type 2 diabetes mellitus with unspecified diabetic retinopathy with macular edema: Secondary | ICD-10-CM

## 2011-12-06 ENCOUNTER — Encounter: Payer: Self-pay | Admitting: Gastroenterology

## 2011-12-16 ENCOUNTER — Encounter (INDEPENDENT_AMBULATORY_CARE_PROVIDER_SITE_OTHER): Payer: Medicare Other | Admitting: Ophthalmology

## 2011-12-16 DIAGNOSIS — E1165 Type 2 diabetes mellitus with hyperglycemia: Secondary | ICD-10-CM

## 2011-12-16 DIAGNOSIS — H43819 Vitreous degeneration, unspecified eye: Secondary | ICD-10-CM

## 2011-12-16 DIAGNOSIS — E11359 Type 2 diabetes mellitus with proliferative diabetic retinopathy without macular edema: Secondary | ICD-10-CM

## 2011-12-16 DIAGNOSIS — E11311 Type 2 diabetes mellitus with unspecified diabetic retinopathy with macular edema: Secondary | ICD-10-CM

## 2011-12-20 ENCOUNTER — Ambulatory Visit (INDEPENDENT_AMBULATORY_CARE_PROVIDER_SITE_OTHER): Payer: Medicare Other | Admitting: Gastroenterology

## 2011-12-20 ENCOUNTER — Encounter: Payer: Self-pay | Admitting: Gastroenterology

## 2011-12-20 VITALS — BP 140/70 | HR 80 | Ht 63.5 in | Wt 164.4 lb

## 2011-12-20 DIAGNOSIS — Z86010 Personal history of colonic polyps: Secondary | ICD-10-CM

## 2011-12-20 DIAGNOSIS — R195 Other fecal abnormalities: Secondary | ICD-10-CM

## 2011-12-20 NOTE — Progress Notes (Signed)
Review of pertinent gastrointestinal problems: 1.  adenomatous colon polyps. Colonoscopy December 2008 found 2 polyps, one was greater than 1 cm. Both were removed but only one was retrieved. This was adenomatous. She was recommended to have repeat colonoscopy at 3 year interval, December 2011. A reminder letter was sent.   HPI: This is a   very pleasant 78 year old woman who is here with her daughter and granddaughter today.   I saw her here in the office a little over a year ago to discuss polyp surveillance colonoscopy. At that time she had recently started on Coumadin for atrial fibrillation and was on home oxygen continuously for pulmonary fibrosis that was fairly at that time. We decided to stop screening her for colon cancer and forego any further polyp surveillance examinations.   Last month she was at her primary care office and they  checked Hemoccult stool x3 and they were all positive.   She is still on Coumadin and recently was started on Plavix for coronary artery disease and a stent placement. She is not on oxygen anymore. Recent blood counts show a very slight anemia with a hemoglobin of 11.3   Weight has gone down slowly over time.   Review of systems: Pertinent positive and negative review of systems were noted in the above HPI section. Complete review of systems was performed and was otherwise normal.    Past Medical History  Diagnosis Date  . Hypertension     with h/o hypertensive urgency  . Glaucoma   . Tremor, essential   . Diabetes mellitus with neuropathy   . B12 deficiency   . Hyperlipidemia   . Atrial fibrillation     a. on chronic coumadin;  b. Tiksosyn adjusted to 250 mcg bid 08/2011 admission  . Chronic diastolic heart failure     echo 05/2009: mild LVH, EF 55-60%, grade 2 diast dysfxn, mild LAE, PASP  . CAD (coronary artery disease)     a.  LHC 06/2009: pLAD 40%, prox-mid D1 80% (small);  CFX 40-50%, mRCA 20%, EF 65%  -  med Rx;  b.  05/23/11 Myoview -  EF 53%, no isch/inf;  c. 07/2011 NSTEMI - PCI/DES LCX - Resolute stent  . Carotid stenosis     dopplers 6/12: 1-39% bilat ICA  . Pulmonary fibrosis     Dr. Vassie Loll;  patient taken off O2 in 8/12; dyspnea felt CHF>>ILD  . Skin cancer   . Warfarin anticoagulation   . Hypomagnesemia   . Colon polyps     Past Surgical History  Procedure Date  . Appendectomy   . Nose surgery   . Cardiac catheterization 2013    stent  . Colonoscopy     Current Outpatient Prescriptions  Medication Sig Dispense Refill  . Cholecalciferol (VITAMIN D) 1000 UNITS capsule Take 1,000 Units by mouth daily. OTC        . clopidogrel (PLAVIX) 75 MG tablet Take 75 mg by mouth daily.      . colesevelam (WELCHOL) 625 MG tablet Take 1,250 mg by mouth daily.       . Cyanocobalamin (VITAMIN B-12 IJ) Inject 1 application as directed every 30 (thirty) days. Given at the doctor's office at the end of each month      . dofetilide (TIKOSYN) 250 MCG capsule Take 1 capsule (250 mcg total) by mouth 2 (two) times daily.      . fish oil-omega-3 fatty acids 1000 MG capsule Take 1 g by mouth daily.      Marland Kitchen  furosemide (LASIX) 40 MG tablet Take 0.5 tablets (20 mg total) by mouth daily.  30 tablet  11  . isosorbide mononitrate (IMDUR) 30 MG 24 hr tablet Take 30 mg by mouth daily.      Marland Kitchen latanoprost (XALATAN) 0.005 % ophthalmic solution Place 1 drop into both eyes at bedtime.       . magnesium oxide (MAG-OX) 400 MG tablet Take 1 tablet (400 mg total) by mouth 3 (three) times daily.  90 tablet  11  . metFORMIN (GLUCOPHAGE) 1000 MG tablet Take 1,000 mg by mouth 2 (two) times daily with a meal.      . metoprolol (LOPRESSOR) 100 MG tablet Take 1 tablet (100 mg total) by mouth 2 (two) times daily.  60 tablet  6  . nitroGLYCERIN (NITROSTAT) 0.4 MG SL tablet Place 0.4 mg under the tongue every 5 (five) minutes as needed. As needed for chest pain      . NOVOLOG MIX 70/30 FLEXPEN (70-30) 100 UNIT/ML injection Inject 20-34 Units into the skin Twice  daily. 34 units in the morning & 20 units in the evening      . potassium chloride SA (K-DUR,KLOR-CON) 10 MEQ tablet Take 1 tablet (10 mEq total) by mouth daily.  30 tablet  11  . warfarin (COUMADIN) 5 MG tablet Take 5-10 mg by mouth daily. Alternates days and takes 10 mg on Tuesdays, Thursdays & Saturday.  & 5 mg other days of the week.      Marland Kitchen DISCONTD: diltiazem (TIAZAC) 420 MG 24 hr capsule Take 420 mg by mouth daily.        Allergies as of 12/20/2011 - Review Complete 12/20/2011  Allergen Reaction Noted  . Ace inhibitors Cough 11/09/2010  . Crestor (rosuvastatin calcium)  11/09/2010  . Lipitor (atorvastatin calcium)  11/09/2010  . Niaspan (niacin)  11/09/2010  . Olmesartan Cough 05/23/2011  . Prednisone      Family History  Problem Relation Age of Onset  . Throat cancer Brother   . Prostate cancer Father   . Diabetes Mother   . Diabetes Brother     multiple  . Heart disease Brother   . Colon cancer Neg Hx     History   Social History  . Marital Status: Widowed    Spouse Name: N/A    Number of Children: 1  . Years of Education: N/A   Occupational History  . retired    Social History Main Topics  . Smoking status: Never Smoker   . Smokeless tobacco: Never Used  . Alcohol Use: No  . Drug Use: No  . Sexually Active: No   Other Topics Concern  . Not on file   Social History Narrative  . No narrative on file       Physical Exam: BP 140/70  Pulse 80  Ht 5' 3.5" (1.613 m)  Wt 164 lb 6.4 oz (74.571 kg)  BMI 28.67 kg/m2 Constitutional: generally well-appearing Psychiatric: alert and oriented x3 Eyes: extraocular movements intact Mouth: oral pharynx moist, no lesions Neck: supple no lymphadenopathy Cardiovascular: heart regular rate and rhythm Lungs: clear to auscultation bilaterally Abdomen: soft, nontender, nondistended, no obvious ascites, no peritoneal signs, normal bowel sounds Extremities: no lower extremity edema bilaterally Skin: no lesions on  visible extremities    Assessment and plan: 78 y.o. female with  personal history of adenomatous colon polyps, Hemoccult-positive stool, known pulmonary fibrosis, CHF, coronary artery disease, atrial fibrillation  A year ago she and I decided to forego further  screening, surveillance colonoscopies due to her multiple medical comorbidities. Now she comes here with results of 3 Hemoccult tests done by her primary care physician which were all positive. She is not only on Coumadin now but also Plavix for a recent coronary artery stent. I explained to her the dilemma that we are in. I told her I think it is unlikely that she has anything serious such as a colon cancer going on. She does have mild anemia however and 3 out of 3 stools were positive for Hemoccult blood. I told her that of course even though I think she does not have a colon cancer could be wrong and I recommended she have a colonoscopy. This is because she has had these 3 Hemoccult tests now. She is meeting her cardiologist later this week. We will send a note to him to consider holding Plavix and/or Coumadin. If we were to do a colonoscopy it should be at [facility] given her multiple comorbidities.

## 2011-12-20 NOTE — Patient Instructions (Addendum)
You will be set up for a colonoscopy at [facility] for heme positive stool after you see your cardiologist later this week. Will need input on whether you can stop the coumadin and plavix for the colonoscopy from Dr. Antoine Poche.  Please call after you have seen your Cardiologist and Dr Christella Hartigan will decide on timing of colonoscopy.  [phone]

## 2011-12-22 ENCOUNTER — Ambulatory Visit (INDEPENDENT_AMBULATORY_CARE_PROVIDER_SITE_OTHER): Payer: Medicare Other | Admitting: Cardiology

## 2011-12-22 ENCOUNTER — Encounter: Payer: Self-pay | Admitting: Cardiology

## 2011-12-22 VITALS — BP 150/70 | HR 68 | Ht 64.0 in | Wt 164.0 lb

## 2011-12-22 DIAGNOSIS — R42 Dizziness and giddiness: Secondary | ICD-10-CM

## 2011-12-22 DIAGNOSIS — Z86010 Personal history of colonic polyps: Secondary | ICD-10-CM

## 2011-12-22 DIAGNOSIS — R0989 Other specified symptoms and signs involving the circulatory and respiratory systems: Secondary | ICD-10-CM

## 2011-12-22 DIAGNOSIS — I2510 Atherosclerotic heart disease of native coronary artery without angina pectoris: Secondary | ICD-10-CM

## 2011-12-22 DIAGNOSIS — I4891 Unspecified atrial fibrillation: Secondary | ICD-10-CM

## 2011-12-22 DIAGNOSIS — I10 Essential (primary) hypertension: Secondary | ICD-10-CM

## 2011-12-22 MED ORDER — METOPROLOL TARTRATE 50 MG PO TABS: 50.0000 mg | ORAL_TABLET | Freq: Two times a day (BID) | ORAL | Status: DC

## 2011-12-22 MED ORDER — ISOSORBIDE MONONITRATE ER 60 MG PO TB24: 60.0000 mg | ORAL_TABLET | Freq: Every day | ORAL | Status: DC

## 2011-12-22 NOTE — Assessment & Plan Note (Signed)
Her blood pressure is slightly increased today.  However, given the dizziness I will not aggressively pursue this at this point.  I am going to reduce the beta blocker slightly because of the dizziness.

## 2011-12-22 NOTE — Assessment & Plan Note (Signed)
She did have one episode of chest discomfort today. I'm going to increase her Imdur to 60 mg daily. She should come to the emergency room if she has increasing symptoms.

## 2011-12-22 NOTE — Progress Notes (Signed)
HPI The patient presents for followup of her arrhythmias and coronary disease. Since I last saw her she has been found black positive stools and is being considered for colonoscopy.  She has been having some lightheadedness and occasional dizzy episodes. She describes some orthostasis or dizziness occurs independent of changes in position. This morning she actually had some chest discomfort. This was while she was rushing to get over here. It was left-sided and some discomfort on her right arm. It might have been similar to previous angina but was much less severe. She's not otherwise been having this. She may be having some slight increasing dyspnea compared with previous but isn't having any PND or orthopnea. She describes no associated symptoms such as nausea vomiting or diaphoresis. She's not had any palpitations or  Allergies  Allergen Reactions  . Ace Inhibitors Cough    Enalapril  benicor    . Crestor (Rosuvastatin Calcium)     Fatigue and leg pain   . Lipitor (Atorvastatin Calcium)     Myalgias   . Niaspan (Niacin)     Intol   . Olmesartan Cough  . Prednisone     REACTION: "retained fluid"    Current Outpatient Prescriptions  Medication Sig Dispense Refill  . Cholecalciferol (VITAMIN D) 1000 UNITS capsule Take 1,000 Units by mouth daily. OTC        . clopidogrel (PLAVIX) 75 MG tablet Take 75 mg by mouth daily.      . colesevelam (WELCHOL) 625 MG tablet Take 1,250 mg by mouth daily.       . Cyanocobalamin (VITAMIN B-12 IJ) Inject 1 application as directed every 30 (thirty) days. Given at the doctor's office at the end of each month      . dofetilide (TIKOSYN) 250 MCG capsule Take 1 capsule (250 mcg total) by mouth 2 (two) times daily.      . fish oil-omega-3 fatty acids 1000 MG capsule Take 1 g by mouth daily.      . furosemide (LASIX) 40 MG tablet Take 0.5 tablets (20 mg total) by mouth daily.  30 tablet  11  . isosorbide mononitrate (IMDUR) 30 MG 24 hr tablet Take 30 mg by  mouth daily.      Marland Kitchen latanoprost (XALATAN) 0.005 % ophthalmic solution Place 1 drop into both eyes at bedtime.       . magnesium oxide (MAG-OX) 400 MG tablet Take 1 tablet (400 mg total) by mouth 3 (three) times daily.  90 tablet  11  . metFORMIN (GLUCOPHAGE) 1000 MG tablet Take 1,000 mg by mouth 2 (two) times daily with a meal.      . metoprolol (LOPRESSOR) 100 MG tablet Take 1 tablet (100 mg total) by mouth 2 (two) times daily.  60 tablet  6  . nitroGLYCERIN (NITROSTAT) 0.4 MG SL tablet Place 0.4 mg under the tongue every 5 (five) minutes as needed. As needed for chest pain      . NOVOLOG MIX 70/30 FLEXPEN (70-30) 100 UNIT/ML injection Inject 20-34 Units into the skin Twice daily. 34 units in the morning & 20 units in the evening      . potassium chloride SA (K-DUR,KLOR-CON) 10 MEQ tablet Take 1 tablet (10 mEq total) by mouth daily.  30 tablet  11  . warfarin (COUMADIN) 5 MG tablet Take 5-10 mg by mouth daily. Alternates days and takes 10 mg on Tuesdays, Thursdays & Saturday.  & 5 mg other days of the week.      Marland Kitchen  DISCONTD: diltiazem (TIAZAC) 420 MG 24 hr capsule Take 420 mg by mouth daily.        Past Medical History  Diagnosis Date  . Hypertension     with h/o hypertensive urgency  . Glaucoma   . Tremor, essential   . Diabetes mellitus with neuropathy   . B12 deficiency   . Hyperlipidemia   . Atrial fibrillation     a. on chronic coumadin;  b. Tiksosyn adjusted to 250 mcg bid 08/2011 admission  . Chronic diastolic heart failure     echo 05/2009: mild LVH, EF 55-60%, grade 2 diast dysfxn, mild LAE, PASP  . CAD (coronary artery disease)     a.  LHC 06/2009: pLAD 40%, prox-mid D1 80% (small);  CFX 40-50%, mRCA 20%, EF 65%  -  med Rx;  b.  05/23/11 Myoview - EF 53%, no isch/inf;  c. 07/2011 NSTEMI - PCI/DES LCX - Resolute stent  . Carotid stenosis     dopplers 6/12: 1-39% bilat ICA  . Pulmonary fibrosis     Dr. Vassie Loll;  patient taken off O2 in 8/12; dyspnea felt CHF>>ILD  . Skin cancer   .  Warfarin anticoagulation   . Hypomagnesemia   . Colon polyps     Past Surgical History  Procedure Date  . Appendectomy   . Nose surgery   . Cardiac catheterization 2013    stent  . Colonoscopy    ROS:  As stated in the HPI and negative for all other systems.  PHYSICAL EXAM BP 150/70  Pulse 68  Ht 5\' 4"  (1.626 m)  Wt 164 lb (74.39 kg)  BMI 28.15 kg/m2 GENERAL:  Well appearing HEENT:  Pupils equal round and reactive, fundi not visualized, oral mucosa unremarkable NECK:  No jugular venous distention, waveform within normal limits, carotid upstroke brisk and symmetric, left bruit, no thyromegaly LYMPHATICS:  No cervical, inguinal adenopathy LUNGS:  Clear to auscultation bilaterally BACK:  No CVA tenderness CHEST:  Unremarkable HEART:  PMI not displaced or sustained,S1 and S2 within normal limits, no S3, no S4, no clicks, no rubs, no murmurs ABD:  Flat, positive bowel sounds normal in frequency in pitch, mid bruits, no rebound, no guarding, no midline pulsatile mass, no hepatomegaly, no splenomegaly EXT:  2 plus pulses throughout, no edema, no cyanosis no clubbing SKIN:  No rashes no nodules NEURO:  Cranial nerves II through XII grossly intact, motor grossly intact throughout PSYCH:  Cognitively intact, oriented to person place and time  EKG:  Sinus rhythm, rate 70, axis within normal limits, old anteroseptal infarct, premature atrial contractions, intervals within normal limits, no acute ST-T wave changes. 12/22/2011   ASSESSMENT AND PLAN

## 2011-12-22 NOTE — Patient Instructions (Addendum)
Please decrease your Metoprolol to 50 mg twice a day Increase Imdur (Isosorbide) to 60 mg a day Continue all other medications as listed.  Follow up with Dr Antoine Poche in [place] in 4 months

## 2011-12-22 NOTE — Assessment & Plan Note (Signed)
The patient has a history of colonic polyps with recent black stools. She's being considered for colonoscopy. She could not be taken off of her Plavix for this until February of next year. Her warfarin could stopped with the procedure as needed.

## 2011-12-22 NOTE — Assessment & Plan Note (Signed)
She has a dry and now bruits. She says she's following surgery and called the office today to make sure she has had recent or up-to-date followup.

## 2011-12-22 NOTE — Assessment & Plan Note (Signed)
She seems to be maintaining sinus rhythm. For now she will continue the meds as listed with changes as above.

## 2011-12-23 ENCOUNTER — Telehealth: Payer: Self-pay

## 2011-12-23 DIAGNOSIS — R195 Other fecal abnormalities: Secondary | ICD-10-CM

## 2011-12-23 NOTE — Telephone Encounter (Signed)
Alexandria [name], Her cardiologist said it is ok for her to come off of coumadin (5 days) for procedure. She is supposed to stay ON plavix for another 6-8 months. He is adjusting her imdur, bblocker for dizziness and chest pain. Can you call her, given med adjustments I'd like to see her in office in 4 weeks (cbc just prior) before scheduling procedure. Thanks ----- Message ----- From: [name], MD Sent: 12/22/2011 12:48 PM To: Rachael Fee, MD

## 2011-12-23 NOTE — Telephone Encounter (Signed)
Pt has been given appt and labs to be done the morning of the appt

## 2011-12-24 ENCOUNTER — Emergency Department (HOSPITAL_COMMUNITY): Payer: Medicare Other

## 2011-12-24 ENCOUNTER — Inpatient Hospital Stay (HOSPITAL_COMMUNITY): Payer: Medicare Other

## 2011-12-24 ENCOUNTER — Inpatient Hospital Stay (HOSPITAL_COMMUNITY)
Admission: EM | Admit: 2011-12-24 | Discharge: 2011-12-28 | DRG: 287 | Disposition: A | Discharge status: Home / Self Care | Payer: Medicare Other | Attending: Cardiology | Admitting: Cardiology

## 2011-12-24 DIAGNOSIS — Z7901 Long term (current) use of anticoagulants: Secondary | ICD-10-CM

## 2011-12-24 DIAGNOSIS — Z860100 Personal history of colon polyps, unspecified: Secondary | ICD-10-CM

## 2011-12-24 DIAGNOSIS — G252 Other specified forms of tremor: Secondary | ICD-10-CM | POA: Diagnosis present

## 2011-12-24 DIAGNOSIS — I10 Essential (primary) hypertension: Secondary | ICD-10-CM | POA: Diagnosis present

## 2011-12-24 DIAGNOSIS — E785 Hyperlipidemia, unspecified: Secondary | ICD-10-CM | POA: Diagnosis present

## 2011-12-24 DIAGNOSIS — I2510 Atherosclerotic heart disease of native coronary artery without angina pectoris: Secondary | ICD-10-CM | POA: Diagnosis present

## 2011-12-24 DIAGNOSIS — I4891 Unspecified atrial fibrillation: Secondary | ICD-10-CM | POA: Diagnosis present

## 2011-12-24 DIAGNOSIS — I252 Old myocardial infarction: Secondary | ICD-10-CM

## 2011-12-24 DIAGNOSIS — Z9861 Coronary angioplasty status: Secondary | ICD-10-CM

## 2011-12-24 DIAGNOSIS — E1149 Type 2 diabetes mellitus with other diabetic neurological complication: Secondary | ICD-10-CM | POA: Diagnosis present

## 2011-12-24 DIAGNOSIS — I509 Heart failure, unspecified: Secondary | ICD-10-CM | POA: Diagnosis present

## 2011-12-24 DIAGNOSIS — G250 Essential tremor: Secondary | ICD-10-CM | POA: Diagnosis present

## 2011-12-24 DIAGNOSIS — R079 Chest pain, unspecified: Secondary | ICD-10-CM

## 2011-12-24 DIAGNOSIS — E1142 Type 2 diabetes mellitus with diabetic polyneuropathy: Secondary | ICD-10-CM | POA: Diagnosis present

## 2011-12-24 DIAGNOSIS — I5032 Chronic diastolic (congestive) heart failure: Secondary | ICD-10-CM | POA: Diagnosis present

## 2011-12-24 DIAGNOSIS — R55 Syncope and collapse: Principal | ICD-10-CM | POA: Diagnosis present

## 2011-12-24 HISTORY — DX: Syncope and collapse: R55

## 2011-12-24 LAB — CBC WITH DIFFERENTIAL/PLATELET
Basophils Absolute: 0 K/uL (ref 0.0–0.1)
Basophils Relative: 0 % (ref 0–1)
Eosinophils Absolute: 0.1 K/uL (ref 0.0–0.7)
Eosinophils Relative: 1 % (ref 0–5)
HCT: 37 % (ref 36.0–46.0)
Hemoglobin: 12.1 g/dL (ref 12.0–15.0)
Lymphocytes Relative: 18 % (ref 12–46)
Lymphs Abs: 1.7 K/uL (ref 0.7–4.0)
MCH: 26.5 pg (ref 26.0–34.0)
MCHC: 32.7 g/dL (ref 30.0–36.0)
MCV: 81.1 fL (ref 78.0–100.0)
Monocytes Absolute: 0.7 K/uL (ref 0.1–1.0)
Monocytes Relative: 7 % (ref 3–12)
Neutro Abs: 7.2 K/uL (ref 1.7–7.7)
Neutrophils Relative %: 74 % (ref 43–77)
Platelets: 225 K/uL (ref 150–400)
RBC: 4.56 MIL/uL (ref 3.87–5.11)
RDW: 13.6 % (ref 11.5–15.5)
WBC: 9.7 K/uL (ref 4.0–10.5)

## 2011-12-24 LAB — URINALYSIS, ROUTINE W REFLEX MICROSCOPIC
Nitrite: NEGATIVE
Specific Gravity, Urine: 1.031 — ABNORMAL HIGH (ref 1.005–1.030)
Urobilinogen, UA: 1 mg/dL (ref 0.0–1.0)
pH: 7 (ref 5.0–8.0)

## 2011-12-24 LAB — COMPREHENSIVE METABOLIC PANEL
ALT: 7 U/L (ref 0–35)
AST: 12 U/L (ref 0–37)
AST: 9 U/L (ref 0–37)
Albumin: 3.5 g/dL (ref 3.5–5.2)
BUN: 20 mg/dL (ref 6–23)
CO2: 27 mEq/L (ref 19–32)
CO2: 26 mEq/L (ref 19–32)
Calcium: 10.8 mg/dL — ABNORMAL HIGH (ref 8.4–10.5)
Calcium: 10.3 mg/dL (ref 8.4–10.5)
Chloride: 101 mEq/L (ref 96–112)
Creatinine, Ser: 0.74 mg/dL (ref 0.50–1.10)
GFR calc non Af Amer: 79 mL/min — ABNORMAL LOW (ref >90)
Sodium: 136 mEq/L (ref 135–145)
Total Bilirubin: 0.4 mg/dL (ref 0.3–1.2)
Total Protein: 7.2 g/dL (ref 6.0–8.3)

## 2011-12-24 LAB — CARDIAC PANEL(CRET KIN+CKTOT+MB+TROPI)
Relative Index: INVALID (ref 0.0–2.5)
Troponin I: <0.3 ng/mL (ref <0.30)

## 2011-12-24 LAB — CBC
HCT: 33 % — ABNORMAL LOW (ref 36.0–46.0)
Hemoglobin: 10.8 g/dL — ABNORMAL LOW (ref 12.0–15.0)
MCHC: 32.7 g/dL (ref 30.0–36.0)
MCV: 81.3 fL (ref 78.0–100.0)
RDW: 13.7 % (ref 11.5–15.5)

## 2011-12-24 LAB — GLUCOSE, CAPILLARY: Glucose-Capillary: 210 mg/dL — ABNORMAL HIGH (ref 70–99)

## 2011-12-24 LAB — DIFFERENTIAL
Basophils Absolute: 0 10*3/uL (ref 0.0–0.1)
Basophils Relative: 0 % (ref 0–1)
Eosinophils Relative: 2 % (ref 0–5)
Lymphocytes Relative: 24 % (ref 12–46)
Monocytes Absolute: 0.6 10*3/uL (ref 0.1–1.0)
Monocytes Relative: 8 % (ref 3–12)

## 2011-12-24 LAB — PROTIME-INR
INR: 1.85 — ABNORMAL HIGH (ref 0.00–1.49)
Prothrombin Time: 21.7 s — ABNORMAL HIGH (ref 11.6–15.2)

## 2011-12-24 LAB — MAGNESIUM: Magnesium: 1.8 mg/dL (ref 1.5–2.5)

## 2011-12-24 LAB — URINE MICROSCOPIC-ADD ON

## 2011-12-24 MED ORDER — ACETAMINOPHEN 325 MG PO TABS
650.0000 mg | ORAL_TABLET | ORAL | Status: DC | PRN
  Administered 2011-12-27: 650 mg via ORAL
  Filled 2011-12-24: qty 2

## 2011-12-24 MED ORDER — DOFETILIDE 125 MCG PO CAPS
125.0000 ug | ORAL_CAPSULE | Freq: Two times a day (BID) | ORAL | Status: DC
  Administered 2011-12-24 – 2011-12-25 (×2): 125 ug via ORAL
  Filled 2011-12-24 (×3): qty 1

## 2011-12-24 MED ORDER — INSULIN ASPART PROT & ASPART (70-30 MIX) 100 UNIT/ML ~~LOC~~ SUSP
34.0000 [IU] | Freq: Every day | SUBCUTANEOUS | Status: DC
  Administered 2011-12-25 – 2011-12-27 (×3): 34 [IU] via SUBCUTANEOUS
  Filled 2011-12-24: qty 10

## 2011-12-24 MED ORDER — ISOSORBIDE MONONITRATE ER 60 MG PO TB24
60.0000 mg | ORAL_TABLET | Freq: Every day | ORAL | Status: DC
  Administered 2011-12-24 – 2011-12-28 (×5): 60 mg via ORAL
  Filled 2011-12-24 (×6): qty 1

## 2011-12-24 MED ORDER — ASPIRIN 81 MG PO CHEW
324.0000 mg | CHEWABLE_TABLET | Freq: Once | ORAL | Status: AC
  Administered 2011-12-24: 324 mg via ORAL
  Filled 2011-12-24: qty 4

## 2011-12-24 MED ORDER — NITROGLYCERIN 0.4 MG SL SUBL: 0.4000 mg | SUBLINGUAL_TABLET | SUBLINGUAL | Status: DC | PRN

## 2011-12-24 MED ORDER — METOPROLOL TARTRATE 50 MG PO TABS: 50.0000 mg | ORAL_TABLET | Freq: Two times a day (BID) | ORAL | Status: DC

## 2011-12-24 MED ORDER — SODIUM CHLORIDE 0.9 % IV BOLUS (SEPSIS)
500.0000 mL | Freq: Once | INTRAVENOUS | Status: AC
  Administered 2011-12-24: 500 mL via INTRAVENOUS

## 2011-12-24 MED ORDER — INSULIN ASPART PROT & ASPART (70-30 MIX) 100 UNIT/ML ~~LOC~~ SUSP: 20.0000 [IU] | Freq: Two times a day (BID) | SUBCUTANEOUS | Status: DC

## 2011-12-24 MED ORDER — METFORMIN HCL 500 MG PO TABS
1000.0000 mg | ORAL_TABLET | Freq: Two times a day (BID) | ORAL | Status: DC
  Administered 2011-12-25 (×2): 1000 mg via ORAL
  Filled 2011-12-24 (×5): qty 2

## 2011-12-24 MED ORDER — CLOPIDOGREL BISULFATE 75 MG PO TABS
75.0000 mg | ORAL_TABLET | Freq: Every day | ORAL | Status: DC
  Administered 2011-12-25 – 2011-12-28 (×4): 75 mg via ORAL
  Filled 2011-12-24 (×4): qty 1

## 2011-12-24 MED ORDER — ASPIRIN 81 MG PO CHEW: 324.0000 mg | CHEWABLE_TABLET | ORAL | Status: AC

## 2011-12-24 MED ORDER — HEPARIN (PORCINE) IN NACL 100-0.45 UNIT/ML-% IJ SOLN
900.0000 [IU]/h | INTRAMUSCULAR | Status: DC
  Administered 2011-12-24: 800 [IU]/h via INTRAVENOUS
  Filled 2011-12-24: qty 250

## 2011-12-24 MED ORDER — ONDANSETRON HCL 4 MG/2ML IJ SOLN: 4.0000 mg | Freq: Four times a day (QID) | INTRAMUSCULAR | Status: DC | PRN

## 2011-12-24 MED ORDER — SODIUM CHLORIDE 0.9 % IV SOLN
250.0000 mL | INTRAVENOUS | Status: DC | PRN
  Administered 2011-12-24: 500 mL via INTRAVENOUS

## 2011-12-24 MED ORDER — SODIUM CHLORIDE 0.9 % IJ SOLN: 3.0000 mL | INTRAMUSCULAR | Status: DC | PRN

## 2011-12-24 MED ORDER — COLESEVELAM HCL 625 MG PO TABS
1250.0000 mg | ORAL_TABLET | Freq: Every day | ORAL | Status: DC
  Administered 2011-12-25 – 2011-12-27 (×3): 1250 mg via ORAL
  Filled 2011-12-24 (×5): qty 2

## 2011-12-24 MED ORDER — LATANOPROST 0.005 % OP SOLN
1.0000 [drp] | Freq: Every day | OPHTHALMIC | Status: DC
  Administered 2011-12-24 – 2011-12-27 (×4): 1 [drp] via OPHTHALMIC
  Filled 2011-12-24 (×2): qty 2.5

## 2011-12-24 MED ORDER — POTASSIUM CHLORIDE CRYS ER 10 MEQ PO TBCR
10.0000 meq | EXTENDED_RELEASE_TABLET | Freq: Every day | ORAL | Status: DC
  Administered 2011-12-24 – 2011-12-28 (×4): 10 meq via ORAL
  Filled 2011-12-24 (×6): qty 1

## 2011-12-24 MED ORDER — VITAMIN D 1000 UNITS PO CAPS: 1000.0000 [IU] | ORAL_CAPSULE | Freq: Every day | ORAL | Status: DC

## 2011-12-24 MED ORDER — SODIUM CHLORIDE 0.9 % IJ SOLN
3.0000 mL | Freq: Two times a day (BID) | INTRAMUSCULAR | Status: DC
  Administered 2011-12-25: 10 mL via INTRAVENOUS
  Administered 2011-12-26 (×2): 3 mL via INTRAVENOUS

## 2011-12-24 MED ORDER — METOPROLOL TARTRATE 50 MG PO TABS
50.0000 mg | ORAL_TABLET | Freq: Two times a day (BID) | ORAL | Status: DC
  Administered 2011-12-24 – 2011-12-25 (×2): 50 mg via ORAL
  Filled 2011-12-24 (×3): qty 1

## 2011-12-24 MED ORDER — VITAMIN D3 25 MCG (1000 UNIT) PO TABS
1000.0000 [IU] | ORAL_TABLET | Freq: Every day | ORAL | Status: DC
  Administered 2011-12-25 – 2011-12-28 (×3): 1000 [IU] via ORAL
  Filled 2011-12-24 (×5): qty 1

## 2011-12-24 MED ORDER — FUROSEMIDE 20 MG PO TABS
20.0000 mg | ORAL_TABLET | Freq: Every day | ORAL | Status: DC
  Administered 2011-12-24 – 2011-12-28 (×4): 20 mg via ORAL
  Filled 2011-12-24 (×6): qty 1

## 2011-12-24 MED ORDER — INSULIN ASPART PROT & ASPART (70-30 MIX) 100 UNIT/ML ~~LOC~~ SUSP
20.0000 [IU] | Freq: Every day | SUBCUTANEOUS | Status: DC
  Administered 2011-12-25 – 2011-12-27 (×3): 20 [IU] via SUBCUTANEOUS
  Filled 2011-12-24 (×3): qty 10

## 2011-12-24 MED ORDER — SODIUM CHLORIDE 0.9 % IJ SOLN
3.0000 mL | Freq: Two times a day (BID) | INTRAMUSCULAR | Status: DC
Start: 2011-12-24 — End: 2011-12-28
  Administered 2011-12-24 – 2011-12-27 (×4): 3 mL via INTRAVENOUS

## 2011-12-24 MED ORDER — ACETAMINOPHEN 325 MG PO TABS
650.0000 mg | ORAL_TABLET | Freq: Once | ORAL | Status: AC
  Administered 2011-12-24: 650 mg via ORAL
  Filled 2011-12-24: qty 2

## 2011-12-24 MED ORDER — ASPIRIN EC 81 MG PO TBEC
81.0000 mg | DELAYED_RELEASE_TABLET | Freq: Every day | ORAL | Status: DC
  Administered 2011-12-25: 81 mg via ORAL
  Filled 2011-12-24 (×3): qty 1

## 2011-12-24 MED ORDER — NITROGLYCERIN 0.4 MG SL SUBL
0.4000 mg | SUBLINGUAL_TABLET | SUBLINGUAL | Status: DC | PRN
Start: 2011-12-24 — End: 2011-12-28

## 2011-12-24 MED ORDER — MAGNESIUM OXIDE 400 (241.3 MG) MG PO TABS
400.0000 mg | ORAL_TABLET | Freq: Three times a day (TID) | ORAL | Status: DC
  Administered 2011-12-24 – 2011-12-28 (×10): 400 mg via ORAL
  Filled 2011-12-24 (×16): qty 1

## 2011-12-24 MED ORDER — ASPIRIN 300 MG RE SUPP
300.0000 mg | RECTAL | Status: AC
  Filled 2011-12-24: qty 1

## 2011-12-24 MED ORDER — POTASSIUM CHLORIDE IN NACL 20-0.9 MEQ/L-% IV SOLN: INTRAVENOUS | Status: DC

## 2011-12-24 MED ORDER — SODIUM CHLORIDE 0.9 % IV SOLN: 250.0000 mL | INTRAVENOUS | Status: DC | PRN

## 2011-12-24 NOTE — H&P (Signed)
CARDIOLOGY ADMISSION NOTE  Patient ID: Alexandria [name] MRN: [id number] DOB/AGE: 1930-05-03 78 y.o.  Admit date: 12/24/2011 Primary Physician   Dr. Christell Constant Primary Cardiologist   Dr. Antoine Poche Chief Complaint    Syncope  HPI:    The patient is well known to me. She has had a complicated recent history with coronary disease and stenting as described below earlier this year. She's had hospitalizations for atrial fibrillation difficult to control but subsequently controlled with Tikosyn.  She's been doing relatively well and I saw her a couple of days ago in the office. She did describe one episode of chest discomfort. She's been having some episodes of dizziness but no presyncope or syncope. She had been noticing any palpitations. She was in sinus rhythm. She was to start Imdur but she did not get this prescription yet.  This morning she apparently had more chest discomfort. This was more severe than the episode the other day. She actually took nitroglycerin. It happened at rest. It started in her left hand and radiated across her chest her right. Symptoms persisted for up to 45 minutes. She did report associated nausea vomiting or diaphoresis. She wasn't short of breath. She's not been having any PND or orthopnea. She didn't notice any palpitations. She wasn't having any increased dizziness at that time. She went to her hairdresser and was feeling weak and apparently had a syncopal episode. She says she didn't pass out completely but she doesn't recall any events until the ambulance was at her side. I don't have any of their records. Here in the emergency room initial enzymes have been negative. EKG demonstrates no acute changes. She feels well.  Past Medical History  Diagnosis Date  . Hypertension     with h/o hypertensive urgency  . Glaucoma   . Tremor, essential   . Diabetes mellitus with neuropathy   . B12 deficiency   . Hyperlipidemia   . Atrial fibrillation     a. on chronic coumadin;  b.  Tiksosyn adjusted to 250 mcg bid 08/2011 admission  . Chronic diastolic heart failure     echo 05/2009: mild LVH, EF 55-60%, grade 2 diast dysfxn, mild LAE, PASP  . CAD (coronary artery disease)     a.  LHC 06/2009: pLAD 40%, prox-mid D1 80% (small);  CFX 40-50%, mRCA 20%, EF 65%  -  med Rx;  b.  05/23/11 Myoview - EF 53%, no isch/inf;  c. 07/2011 NSTEMI - PCI/DES LCX - Resolute stent  . Carotid stenosis     dopplers 6/12: 1-39% bilat ICA  . Pulmonary fibrosis     Dr. Vassie Loll;  patient taken off O2 in 8/12; dyspnea felt CHF>>ILD  . Skin cancer   . Warfarin anticoagulation   . Hypomagnesemia   . Colon polyps     Past Surgical History  Procedure Date  . Appendectomy   . Nose surgery   . Cardiac catheterization 2013    stent  . Colonoscopy     Allergies  Allergen Reactions  . Ace Inhibitors Cough    Enalapril  benicor    . Crestor (Rosuvastatin Calcium)     Fatigue and leg pain   . Lipitor (Atorvastatin Calcium)     Myalgias   . Niaspan (Niacin)     Intol   . Olmesartan Cough  . Prednisone     REACTION: "retained fluid"   No current facility-administered medications on file prior to encounter.   Current Outpatient Prescriptions on File Prior to Encounter  Medication Sig Dispense Refill  . Cholecalciferol (VITAMIN D) 1000 UNITS capsule Take 1,000 Units by mouth daily. OTC      . clopidogrel (PLAVIX) 75 MG tablet Take 75 mg by mouth daily.      . colesevelam (WELCHOL) 625 MG tablet Take 1,250 mg by mouth daily.       . Cyanocobalamin (VITAMIN B-12 IJ) Inject 1 application as directed every 30 (thirty) days. Given at the doctor's office at the end of each month      . fish oil-omega-3 fatty acids 1000 MG capsule Take 1 g by mouth daily.      Marland Kitchen latanoprost (XALATAN) 0.005 % ophthalmic solution Place 1 drop into both eyes at bedtime.       . nitroGLYCERIN (NITROSTAT) 0.4 MG SL tablet Place 0.4 mg under the tongue every 5 (five) minutes as needed. As needed for chest pain      .  NOVOLOG MIX 70/30 FLEXPEN (70-30) 100 UNIT/ML injection Inject 20-34 Units into the skin Twice daily. 34 units in the morning & 20 units in the evening      . warfarin (COUMADIN) 5 MG tablet Take 5-10 mg by mouth daily. Alternates days and takes 10 mg on Tuesdays, Thursdays & Saturday.  & 5 mg other days of the week.      Marland Kitchen DISCONTD: dofetilide (TIKOSYN) 250 MCG capsule Take 1 capsule (250 mcg total) by mouth 2 (two) times daily.      Marland Kitchen DISCONTD: furosemide (LASIX) 40 MG tablet Take 0.5 tablets (20 mg total) by mouth daily.  30 tablet  11  . DISCONTD: isosorbide mononitrate (IMDUR) 60 MG 24 hr tablet Take 1 tablet (60 mg total) by mouth daily.  30 tablet  6  . DISCONTD: metoprolol (LOPRESSOR) 50 MG tablet Take 1 tablet (50 mg total) by mouth 2 (two) times daily.  60 tablet  6  . DISCONTD: potassium chloride SA (K-DUR,KLOR-CON) 10 MEQ tablet Take 1 tablet (10 mEq total) by mouth daily.  30 tablet  11  . metFORMIN (GLUCOPHAGE) 1000 MG tablet Take 1,000 mg by mouth 2 (two) times daily with a meal.      . DISCONTD: diltiazem (TIAZAC) 420 MG 24 hr capsule Take 420 mg by mouth daily.       History   Social History  . Marital Status: Widowed    Spouse Name: N/A    Number of Children: 1  . Years of Education: N/A   Occupational History  . retired    Social History Main Topics  . Smoking status: Never Smoker   . Smokeless tobacco: Never Used  . Alcohol Use: No  . Drug Use: No  . Sexually Active: No   Other Topics Concern  . Not on file   Social History Narrative  . No narrative on file    Family History  Problem Relation Age of Onset  . Throat cancer Brother   . Prostate cancer Father   . Diabetes Mother   . Diabetes Brother     multiple  . Heart disease Brother   . Colon cancer Neg Hx     ROS:  As stated in the HPI and negative for all other systems.  Physical Exam: Blood pressure 137/51, pulse 63, temperature 98.1 F (36.7 C), temperature source Oral, resp. rate 20, SpO2  98.00%.  GENERAL: Well appearing  HEENT: Pupils equal round and reactive, fundi not visualized, oral mucosa unremarkable  NECK: No jugular venous distention, waveform within normal limits,  carotid upstroke brisk and symmetric, left bruit, no thyromegaly  LYMPHATICS: No cervical, inguinal adenopathy  LUNGS: Clear to auscultation bilaterally  BACK: No CVA tenderness  CHEST: Unremarkable  HEART: PMI not displaced or sustained,S1 and S2 within normal limits, no S3, no S4, no clicks, no rubs, no murmurs  ABD: Flat, positive bowel sounds normal in frequency in pitch, mid bruits, no rebound, no guarding, no midline pulsatile mass, no hepatomegaly, no splenomegaly  EXT: 2 plus pulses throughout, no edema, no cyanosis no clubbing  SKIN: No rashes no nodules  NEURO: Cranial nerves II through XII grossly intact, motor grossly intact throughout  PSYCH: Cognitively intact, oriented to person place and time   Labs: Lab Results  Component Value Date   BUN 17 12/24/2011   Lab Results  Component Value Date   CREATININE 0.66 12/24/2011   Lab Results  Component Value Date   NA 136 12/24/2011   K 4.1 12/24/2011   CL 98 12/24/2011   CO2 27 12/24/2011   Lab Results  Component Value Date   CKTOTAL 25 10/11/2011   CKMB 2.7 10/11/2011   TROPONINI <0.30 12/24/2011   Lab Results  Component Value Date   WBC 9.7 12/24/2011   HGB 12.1 12/24/2011   HCT 37.0 12/24/2011   MCV 81.1 12/24/2011   PLT 225 12/24/2011    Lab Results  Component Value Date   ALT 7 12/24/2011   AST 12 12/24/2011   ALKPHOS 75 12/24/2011   BILITOT 0.3 12/24/2011      Radiology:  CXR  1. Low lung volumes without radiographic evidence of acute  cardiopulmonary disease.  2. Atherosclerosis.    EKG:  Sinus rhythm, rate 71, axis within normal limits, intervals within normal limits, old anteroseptal infarct, QTC prolonged.  12/24/2011   ASSESSMENT AND PLAN:    Syncope:  The etiology of this is not clear. 2 significant findings are  chest discomfort which has been recurrent and worrisome. Also the QT is longer today than it was a couple of days ago. She's going to have to workup of coronary disease. I'm going to reduce her dose of Tikosyn and and if her QT remains elevated we will have to discontinue this.  I will check orthostatics and a magnesium level.  I will also check a CT scan to rule out pulmonary embolism.  CAD:  Given her ongoing symptoms she will have repeat cardiac catheterization. I'll hold her Coumadin as she is subtherapeutic already.  The patient understands that risks included but are not limited to stroke (1 in 1000), death (1 in 1000), kidney failure [usually temporary] (1 in 500), bleeding (1 in 200), allergic reaction [possibly serious] (1 in 200).  The patient understands and agrees to proceed.   Atrial fibrillation:  This will be managed as above.  HTN:  She will continue her previous medications for management of this. I will check orthostatics.  Diabetes:  She'll continue her previous regimen.   SignedRollene Rotunda 12/24/2011, 5:39 PM

## 2011-12-24 NOTE — ED Notes (Signed)
Patient aware of need for urine testing. Patient unable to void at this time. Patient encouraged to call for toileting assistance  

## 2011-12-24 NOTE — ED Notes (Signed)
Weakness x 3days, felt better this am, went to [employer]y salon and passed out when she got there, pt ate a bowl of cereal this am for breakfast, still "feels weak"

## 2011-12-24 NOTE — Progress Notes (Signed)
ANTICOAGULATION CONSULT NOTE - Initial Consult  Pharmacy Consult for Heparin Indication: chest pain/ACS  Allergies  Allergen Reactions  . Ace Inhibitors Cough    Enalapril  benicor    . Crestor (Rosuvastatin Calcium)     Fatigue and leg pain   . Lipitor (Atorvastatin Calcium)     Myalgias   . Niaspan (Niacin)     Intol   . Olmesartan Cough  . Prednisone     REACTION: "retained fluid"    Patient Measurements: Height: 5\' 3"  (160 cm) Weight: 161 lb 8 oz (73.256 kg) IBW/kg (Calculated) : 52.4  Heparin Dosing Weight: 67 kg  Vital Signs: Temp: 98.1 F (36.7 C) (06/28 1942) Temp src: Oral (06/28 1942) BP: 143/71 mmHg (06/28 1942) Pulse Rate: 77  (06/28 1942)  Labs:  Basename 12/24/11 1625  HGB 12.1  HCT 37.0  PLT 225  APTT --  LABPROT 21.7*  INR 1.85*  HEPARINUNFRC --  CREATININE 0.66  CKTOTAL --  CKMB --  TROPONINI <0.30    The CrCl is unknown because both a height and weight (above a minimum accepted value) are required for this calculation.   Medical History: Past Medical History  Diagnosis Date  . Hypertension     with h/o hypertensive urgency  . Glaucoma   . Tremor, essential   . Diabetes mellitus with neuropathy   . B12 deficiency   . Hyperlipidemia   . Atrial fibrillation     a. on chronic coumadin;  b. Tiksosyn adjusted to 250 mcg bid 08/2011 admission  . Chronic diastolic heart failure     echo 05/2009: mild LVH, EF 55-60%, grade 2 diast dysfxn, mild LAE, PASP  . CAD (coronary artery disease)     a.  LHC 06/2009: pLAD 40%, prox-mid D1 80% (small);  CFX 40-50%, mRCA 20%, EF 65%  -  med Rx;  b.  05/23/11 Myoview - EF 53%, no isch/inf;  c. 07/2011 NSTEMI - PCI/DES LCX - Resolute stent  . Carotid stenosis     dopplers 6/12: 1-39% bilat ICA  . Pulmonary fibrosis     Dr. Vassie Loll;  patient taken off O2 in 8/12; dyspnea felt CHF>>ILD  . Skin cancer   . Warfarin anticoagulation   . Hypomagnesemia   . Colon polyps     Medications:  Prescriptions  prior to admission  Medication Sig Dispense Refill  . Cholecalciferol (VITAMIN D) 1000 UNITS capsule Take 1,000 Units by mouth daily. OTC      . clopidogrel (PLAVIX) 75 MG tablet Take 75 mg by mouth daily.      . colesevelam (WELCHOL) 625 MG tablet Take 1,250 mg by mouth daily.       . Cyanocobalamin (VITAMIN B-12 IJ) Inject 1 application as directed every 30 (thirty) days. Given at the doctor's office at the end of each month      . dofetilide (TIKOSYN) 250 MCG capsule Take 250 mcg by mouth 2 (two) times daily.      . fish oil-omega-3 fatty acids 1000 MG capsule Take 1 g by mouth daily.      . furosemide (LASIX) 40 MG tablet Take 20 mg by mouth daily.      . isosorbide mononitrate (IMDUR) 60 MG 24 hr tablet Take 60 mg by mouth daily.      Marland Kitchen latanoprost (XALATAN) 0.005 % ophthalmic solution Place 1 drop into both eyes at bedtime.       . magnesium oxide (MAG-OX) 400 MG tablet Take 400 mg by mouth  3 (three) times daily.      . metoprolol (LOPRESSOR) 50 MG tablet Take 50 mg by mouth 2 (two) times daily.      . nitroGLYCERIN (NITROSTAT) 0.4 MG SL tablet Place 0.4 mg under the tongue every 5 (five) minutes as needed. As needed for chest pain      . NOVOLOG MIX 70/30 FLEXPEN (70-30) 100 UNIT/ML injection Inject 20-34 Units into the skin Twice daily. 34 units in the morning & 20 units in the evening      . potassium chloride (K-DUR,KLOR-CON) 10 MEQ tablet Take 10 mEq by mouth daily.      Marland Kitchen warfarin (COUMADIN) 5 MG tablet Take 5-10 mg by mouth daily. Alternates days and takes 10 mg on Tuesdays, Thursdays & Saturday.  & 5 mg other days of the week.      . metFORMIN (GLUCOPHAGE) 1000 MG tablet Take 1,000 mg by mouth 2 (two) times daily with a meal.        Admit Complaint: 78 y.o.  female with CAD and Afib admitted 12/24/2011 with chest pain.  Pharmacy consulted to dose heparin.  Home warfarin: 10 mg Tues, Thurs, Sat, 5 mg other days of the week  Assessment: Anticoagulation: Afib, CAD:  INR 1.85, to  suspend warfarin and initiate heparin, CBC wnl.  Patient denies bleeding but states that her PCP has done 3 positive hemocults lately and has schedule hear for a colonoscopy.  She denies oral iron supplements.  Cardiovascular:CAD, hyperlipidemia, CHF (EF 55%), carotid stenosis: dofetilide dose reduced to 125 BID, follow Mg, K, QTc; ASA, BB, nitrates ordered  Other PMH: Glaucoma, essential tremor, DM, B12 deficiency, hypomagnesemia, pulmonary fibrosis, skin CA  PTA Medication Issues: Allergic to ACEI, statin, niacin Home Meds Not Ordered: fish oil, vitamin D, B12 (shots given at MD office at end of each month)  Best Practices: DVT Prophylaxis:  IV heparin  Goal of Therapy:  Heparin level 0.3-0.7 units/ml Monitor platelets by anticoagulation protocol: Yes   Plan:  1. Heparin 800 units/hr, no bolus given INR=1.85 2. Check heparin level 8h after infusion starts, with AM labs. 3. Daily heparin level and CBC  Thank you for allowing pharmacy to be a part of this patients care team.  Lovenia Kim Pharm.D., BCPS Clinical Pharmacist 12/24/2011 10:00 PM Pager: [phone] Phone: [phone]

## 2011-12-24 NOTE — ED Provider Notes (Signed)
History     CSN: [id number]  Arrival date & time 12/24/11  1346   First MD Initiated Contact with Patient 12/24/11 1543      Chief Complaint  Patient presents with  . Loss of Consciousness    syncope    (Consider location/radiation/quality/duration/timing/severity/associated sxs/prior treatment) HPI  H/o afib on coumadin, DM, CAD on plavix, CHF pw syncope, chest pain. Pt states that she woke up this morning feeling "under the weather". C/O LUE pain and left chest pain  Radiating to right arm. Sharp in nature. Sat down and took 2 ntg and it improved. She was at the beauty parlor and "I just passed out". She c/o mild headache since awakening diffuse 5/10 at this time. Felt lightheaded. Denies headache, dizziness, cp, palpitations, shortness of breath pre fall and currently. No neck pain or back pain. Denies hip pain. Denies numbness/tingling/weakness. Denies shortness of breath past baseline. States "I feel all right now, I just still have a little headache". Glu WNL pta. Denies trauma-- was sitting in chair during the event.   ED Notes, ED Provider Notes from 12/24/11 0000 to 12/24/11 13:57:11       Alfonse Spruce, RN 12/24/2011 13:55      Weakness x 3days, felt better this am, went to beaty salon and passed out when she got there, pt ate a bowl of cereal this am for breakfast, still "feels weak"     Cardiology Dr. Antoine Poche  Past Medical History  Diagnosis Date  . Hypertension     with h/o hypertensive urgency  . Glaucoma   . Tremor, essential   . Diabetes mellitus with neuropathy   . B12 deficiency   . Hyperlipidemia   . Atrial fibrillation     a. on chronic coumadin;  b. Tiksosyn adjusted to 250 mcg bid 08/2011 admission  . Chronic diastolic heart failure     echo 05/2009: mild LVH, EF 55-60%, grade 2 diast dysfxn, mild LAE, PASP  . CAD (coronary artery disease)     a.  LHC 06/2009: pLAD 40%, prox-mid D1 80% (small);  CFX 40-50%, mRCA 20%, EF 65%  -  med Rx;  b.  05/23/11  Myoview - EF 53%, no isch/inf;  c. 07/2011 NSTEMI - PCI/DES LCX - Resolute stent  . Carotid stenosis     dopplers 6/12: 1-39% bilat ICA  . Pulmonary fibrosis     Dr. Vassie Loll;  patient taken off O2 in 8/12; dyspnea felt CHF>>ILD  . Skin cancer   . Warfarin anticoagulation   . Hypomagnesemia   . Colon polyps     Past Surgical History  Procedure Date  . Appendectomy   . Nose surgery   . Cardiac catheterization 2013    stent  . Colonoscopy     Family History  Problem Relation Age of Onset  . Throat cancer Brother   . Prostate cancer Father   . Diabetes Mother   . Diabetes Brother     multiple  . Heart disease Brother   . Colon cancer Neg Hx     History  Substance Use Topics  . Smoking status: Never Smoker   . Smokeless tobacco: Never Used  . Alcohol Use: No    OB History    Grav Para Term Preterm Abortions TAB SAB Ect Mult Living                  Review of Systems  All other systems reviewed and are negative.  except as noted HPI  Allergies  Ace inhibitors; Crestor; Lipitor; Niaspan; Olmesartan; and Prednisone  Home Medications   No current outpatient prescriptions on file.  BP 177/66  Pulse 78  Temp 98.5 F (36.9 C) (Oral)  Resp 18  Ht 5\' 3"  (1.6 m)  Wt 161 lb 8 oz (73.256 kg)  BMI 28.61 kg/m2  SpO2 94%  Physical Exam  Nursing note and vitals reviewed. Constitutional: She is oriented to person, place, and time. She appears well-developed.  HENT:  Head: Atraumatic.  Mouth/Throat: Oropharynx is clear and moist.  Eyes: Conjunctivae and EOM are normal. Pupils are equal, round, and reactive to light.  Neck: Normal range of motion. Neck supple.  Cardiovascular: Normal rate, regular rhythm, normal heart sounds and intact distal pulses.   Pulmonary/Chest: Effort normal and breath sounds normal. No respiratory distress. She has no wheezes. She has no rales.  Abdominal: Soft. She exhibits no distension. There is no tenderness. There is no rebound and no  guarding.  Musculoskeletal: Normal range of motion.  Neurological: She is alert and oriented to person, place, and time. No cranial nerve deficit. She exhibits normal muscle tone. Coordination normal.  Skin: Skin is warm and dry. No rash noted.  Psychiatric: She has a normal mood and affect.    Date: 12/24/2011  Rate: 71  Rhythm: normal sinus rhythm  QRS Axis: normal  Intervals: normal  ST/T Wave abnormalities: nonspecific ST changes  Conduction Disutrbances:none  Narrative Interpretation:   Old EKG Reviewed: unchanged compared to EKG 6/26   ED Course  Procedures (including critical care time)  Labs Reviewed  COMPREHENSIVE METABOLIC PANEL - Abnormal; Notable for the following:    Glucose, Bld 129 (*)     Calcium 10.8 (*)     GFR calc non Af Amer 82 (*)     All other components within normal limits  PROTIME-INR - Abnormal; Notable for the following:    Prothrombin Time 21.7 (*)     INR 1.85 (*)     All other components within normal limits  URINALYSIS, ROUTINE W REFLEX MICROSCOPIC - Abnormal; Notable for the following:    APPearance CLOUDY (*)     Specific Gravity, Urine 1.031 (*)     Bilirubin Urine SMALL (*)     Ketones, ur 15 (*)     Protein, ur 30 (*)     Leukocytes, UA MODERATE (*)     All other components within normal limits  URINE MICROSCOPIC-ADD ON - Abnormal; Notable for the following:    Squamous Epithelial / LPF FEW (*)     Bacteria, UA FEW (*)     All other components within normal limits  PROTIME-INR - Abnormal; Notable for the following:    Prothrombin Time 25.1 (*)     INR 2.23 (*)     All other components within normal limits  CBC - Abnormal; Notable for the following:    Hemoglobin 10.8 (*)     HCT 33.0 (*)     All other components within normal limits  COMPREHENSIVE METABOLIC PANEL - Abnormal; Notable for the following:    Glucose, Bld 234 (*)     Albumin 3.0 (*)     GFR calc non Af Amer 79 (*)     All other components within normal limits    GLUCOSE, CAPILLARY - Abnormal; Notable for the following:    Glucose-Capillary 210 (*)     All other components within normal limits  CBC WITH DIFFERENTIAL  TROPONIN I  CARDIAC PANEL(CRET KIN+CKTOT+MB+TROPI)  APTT  DIFFERENTIAL  MAGNESIUM  CARDIAC PANEL(CRET KIN+CKTOT+MB+TROPI)  CARDIAC PANEL(CRET KIN+CKTOT+MB+TROPI)  CBC  BASIC METABOLIC PANEL  PROTIME-INR  HEPARIN LEVEL (UNFRACTIONATED)   Dg Chest 2 View  12/24/2011  *RADIOLOGY REPORT*  Clinical Data: Loss of consciousness.  Weakness.  CHEST - 2 VIEW  Comparison: Chest x-ray 11/16/2011.  Findings: Lung volumes are low.  No consolidative airspace disease. No pleural effusions.  Calcified granuloma in the left mid lung unchanged.  Mild pulmonary venous congestion without frank pulmonary edema.  Heart size is borderline enlarged.  Mediastinal contours are unremarkable.  Atherosclerotic calcifications are noted within the arch of the aorta.  IMPRESSION: 1.  Low lung volumes without radiographic evidence of acute cardiopulmonary disease. 2.  Atherosclerosis.  Original Report Authenticated By: Florencia Reasons, M.D.   Ct Head Wo Contrast  12/24/2011  *RADIOLOGY REPORT*  Clinical Data: Loss of consciousness, syncope.  CT HEAD WITHOUT CONTRAST  Technique:  Contiguous axial images were obtained from the base of the skull through the vertex without contrast.  Comparison: 08/31/2011  Findings: There is atrophy and chronic small vessel disease changes. No acute intracranial abnormality.  Specifically, no hemorrhage, hydrocephalus, mass lesion, acute infarction, or significant intracranial injury.  No acute calvarial abnormality. Visualized paranasal sinuses and mastoids clear.  Orbital soft tissues unremarkable.  IMPRESSION: No acute intracranial abnormality.  Atrophy, chronic microvascular disease.  Original Report Authenticated By: Cyndie Chime, M.D.     1. Syncope   2. Chest pain   3. Coronary atherosclerosis of unspecified type of vessel,  native or graft      MDM   81yoF pw chest pain, headache, syncope. Orthostatics positive in the Emergency department. May be related to her nitroglycerin use. Headache may be related as well. Low susp PE, dissection based on clinical history. Concern for U/A. EKG unchanged, troponin negative. VSS at this time. Discussed case with cardiology who will evaluate in the Emergency Department.  Cardiology to admit. Transfer to [facility].       Forbes Cellar, MD 12/25/11 0104

## 2011-12-24 NOTE — ED Notes (Signed)
Report called to PAGE, RN at [address] [facility]. Pt to be transferred via [facility].

## 2011-12-25 LAB — BASIC METABOLIC PANEL
CO2: 29 mEq/L (ref 19–32)
Chloride: 101 mEq/L (ref 96–112)
Creatinine, Ser: 0.71 mg/dL (ref 0.50–1.10)
GFR calc Af Amer: >90 mL/min (ref >90)
Potassium: 4.3 mEq/L (ref 3.5–5.1)
Sodium: 139 mEq/L (ref 135–145)

## 2011-12-25 LAB — CBC
MCV: 81.2 fL (ref 78.0–100.0)
Platelets: 189 10*3/uL (ref 150–400)
RBC: 4.15 MIL/uL (ref 3.87–5.11)
RDW: 13.7 % (ref 11.5–15.5)
WBC: 5.6 10*3/uL (ref 4.0–10.5)

## 2011-12-25 LAB — CARDIAC PANEL(CRET KIN+CKTOT+MB+TROPI)
CK, MB: 2.5 ng/mL (ref 0.3–4.0)
Relative Index: INVALID (ref 0.0–2.5)
Relative Index: INVALID (ref 0.0–2.5)
Total CK: 17 U/L (ref 7–177)
Total CK: 21 U/L (ref 7–177)
Troponin I: <0.3 ng/mL (ref <0.30)
Troponin I: <0.3 ng/mL (ref <0.30)

## 2011-12-25 LAB — HEPARIN LEVEL (UNFRACTIONATED)
Heparin Unfractionated: <0.1 IU/mL — ABNORMAL LOW (ref 0.30–0.70)
Heparin Unfractionated: 0.13 IU/mL — ABNORMAL LOW (ref 0.30–0.70)

## 2011-12-25 LAB — PROTIME-INR
INR: 2.12 — ABNORMAL HIGH (ref 0.00–1.49)
Prothrombin Time: 24.1 seconds — ABNORMAL HIGH (ref 11.6–15.2)

## 2011-12-25 MED ORDER — OXYCODONE-ACETAMINOPHEN 5-325 MG PO TABS
1.0000 | ORAL_TABLET | Freq: Four times a day (QID) | ORAL | Status: DC | PRN
  Administered 2011-12-25: 1 via ORAL
  Filled 2011-12-25: qty 1

## 2011-12-25 MED ORDER — HEPARIN BOLUS VIA INFUSION
1000.0000 [IU] | Freq: Once | INTRAVENOUS | Status: AC
  Administered 2011-12-25: 1000 [IU] via INTRAVENOUS
  Filled 2011-12-25: qty 1000

## 2011-12-25 MED ORDER — METOPROLOL TARTRATE 25 MG PO TABS
75.0000 mg | ORAL_TABLET | Freq: Two times a day (BID) | ORAL | Status: DC
  Administered 2011-12-25: 75 mg via ORAL
  Filled 2011-12-25 (×3): qty 1

## 2011-12-25 MED ORDER — POTASSIUM CHLORIDE IN NACL 20-0.9 MEQ/L-% IV SOLN
INTRAVENOUS | Status: DC
  Administered 2011-12-27: 06:00:00 via INTRAVENOUS
  Filled 2011-12-25 (×2): qty 1000

## 2011-12-25 MED ORDER — HEPARIN (PORCINE) IN NACL 100-0.45 UNIT/ML-% IJ SOLN
1200.0000 [IU]/h | INTRAMUSCULAR | Status: DC
  Administered 2011-12-25: 1100 [IU]/h via INTRAVENOUS
  Filled 2011-12-25 (×4): qty 250

## 2011-12-25 NOTE — Progress Notes (Signed)
ANTICOAGULATION CONSULT NOTE   Pharmacy Consult for Heparin Indication: chest pain/ACS  Allergies  Allergen Reactions  . Ace Inhibitors Cough    Enalapril  benicor    . Crestor (Rosuvastatin Calcium)     Fatigue and leg pain   . Lipitor (Atorvastatin Calcium)     Myalgias   . Niaspan (Niacin)     Intol   . Olmesartan Cough  . Prednisone     REACTION: "retained fluid"   Patient Measurements: Height: 5\' 3"  (160 cm) Weight: 161 lb 8 oz (73.256 kg) IBW/kg (Calculated) : 52.4  Heparin Dosing Weight: 67 kg  Labs:  Basename 12/25/11 2100 12/25/11 1120 12/25/11 0327 12/24/11 2221 12/24/11 1625  HGB -- -- 11.1* 10.8* --  HCT -- -- 33.7* 33.0* 37.0  PLT -- -- 189 192 225  APTT -- -- -- 37 --  LABPROT -- -- 24.1* 25.1* 21.7*  INR -- -- 2.12* 2.23* 1.85*  HEPARINUNFRC 0.26* 0.13* <0.10* -- --  CREATININE -- -- 0.71 0.74 0.66  CKTOTAL -- 21 17 16  --  CKMB -- 2.5 2.3 2.1 --  TROPONINI -- <0.30 <0.30 <0.30 --    Estimated Creatinine Clearance: 54.7 ml/min (by C-G formula based on Cr of 0.71).  Assessment: 78 yo female with chest pain, h/o AFiib, Coumadin on hold, for heparin  Goal of Therapy:  Heparin level 0.3-0.7 units/ml Monitor platelets by anticoagulation protocol: Yes   Plan:  Increase heparin 1200 units/hr  Geannie Risen, PharmD, BCPS  12/25/2011 10:41 PM

## 2011-12-25 NOTE — Progress Notes (Signed)
SUBJECTIVE:  Still complains of a headache.  Head CT last PM negative for mass or bleed.  Denies any further chest pain.  OBJECTIVE:   Vitals:   Filed Vitals:   12/24/11 2247 12/24/11 2249 12/24/11 2252 12/25/11 0500  BP: 153/89 168/68 177/66 184/75  Pulse: 78 80 78 72  Temp:    97.8 F (36.6 C)  TempSrc:    Oral  Resp:    18  Height:      Weight:      SpO2: 95% 94% 94% 93%   I&O's:   Intake/Output Summary (Last 24 hours) at 12/25/11 1238 Last data filed at 12/24/11 1804  Gross per 24 hour  Intake    240 ml  Output      0 ml  Net    240 ml   TELEMETRY: Reviewed telemetry pt in NSR with frequent PAC's     PHYSICAL EXAM General: Well developed, well nourished, in no acute distress  Lungs:   Clear bilaterally to auscultation and percussion. Heart:   HRRR S1 S2 Pulses are 2+ & equal. Abdomen: Bowel sounds are positive, abdomen soft and non-tender without masses  Extremities:   No clubbing, cyanosis or edema.  DP +1 Neuro: Alert and oriented X 3. Psych:  Good affect, responds appropriately   LABS: Basic Metabolic Panel:  Basename 12/25/11 0327 12/24/11 2221  NA 139 138  K 4.3 4.0  CL 101 101  CO2 29 26  GLUCOSE 266* 234*  BUN 18 20  CREATININE 0.71 0.74  CALCIUM 10.4 10.3  MG -- 1.8  PHOS -- --   Liver Function Tests:  [facility] 12/24/11 2221 12/24/11 1625  AST 9 12  ALT 7 7  ALKPHOS 65 75  BILITOT 0.4 0.3  PROT 6.4 7.2  ALBUMIN 3.0* 3.5   No results found for this basename: LIPASE:2,AMYLASE:2 in the last 72 hours CBC:  Basename 12/25/11 0327 12/24/11 2221 12/24/11 1625  WBC 5.6 6.7 --  NEUTROABS -- 4.4 7.2  HGB 11.1* 10.8* --  HCT 33.7* 33.0* --  MCV 81.2 81.3 --  PLT 189 192 --   Cardiac Enzymes:  Basename 12/25/11 1120 12/25/11 0327 12/24/11 2221  CKTOTAL 21 17 16   CKMB 2.5 2.3 2.1  CKMBINDEX -- -- --  TROPONINI <0.30 <0.30 <0.30   Coag Panel:   Lab Results  Component Value Date   INR 2.12* 12/25/2011   INR 2.23* 12/24/2011   INR  1.85* 12/24/2011    RADIOLOGY: Dg Chest 2 View  12/24/2011  *RADIOLOGY REPORT*  Clinical Data: Loss of consciousness.  Weakness.  CHEST - 2 VIEW  Comparison: Chest x-ray 11/16/2011.  Findings: Lung volumes are low.  No consolidative airspace disease. No pleural effusions.  Calcified granuloma in the left mid lung unchanged.  Mild pulmonary venous congestion without frank pulmonary edema.  Heart size is borderline enlarged.  Mediastinal contours are unremarkable.  Atherosclerotic calcifications are noted within the arch of the aorta.  IMPRESSION: 1.  Low lung volumes without radiographic evidence of acute cardiopulmonary disease. 2.  Atherosclerosis.  Original Report Authenticated By: Florencia Reasons, M.D.   Ct Head Wo Contrast  12/24/2011  *RADIOLOGY REPORT*  Clinical Data: Loss of consciousness, syncope.  CT HEAD WITHOUT CONTRAST  Technique:  Contiguous axial images were obtained from the base of the skull through the vertex without contrast.  Comparison: 08/31/2011  Findings: There is atrophy and chronic small vessel disease changes. No acute intracranial abnormality.  Specifically, no hemorrhage, hydrocephalus, mass lesion,  acute infarction, or significant intracranial injury.  No acute calvarial abnormality. Visualized paranasal sinuses and mastoids clear.  Orbital soft tissues unremarkable.  IMPRESSION: No acute intracranial abnormality.  Atrophy, chronic microvascular disease.  Original Report Authenticated By: Cyndie Chime, M.D.      ASSESSMENT:  1.  Syncope while sitting in a chair. ? Arrhythmia or progression of underlying CAD 2.  Atrial fibrillation with prolonged QTc on Tikosyn.  Her Tikosyn dose was decreased yesterday but her QTC is longer today at 3.  HTN poorly controlled - she is not orthostatic 4.  CAD 5.  DM  PLAN:   1.  Will d/c Tikosyn for now due to prolonged QTc 2.  Coumadin on hold for cath Monday 3.  Cardiac cath on Monday 4.  Increase Metoprolol to 75mg   BID for better BP control  Quintella Reichert, MD  12/25/2011  12:38 PM

## 2011-12-25 NOTE — Progress Notes (Signed)
ANTICOAGULATION CONSULT NOTE   Pharmacy Consult for Heparin Indication: chest pain/ACS  Allergies  Allergen Reactions  . Ace Inhibitors Cough    Enalapril  benicor    . Crestor (Rosuvastatin Calcium)     Fatigue and leg pain   . Lipitor (Atorvastatin Calcium)     Myalgias   . Niaspan (Niacin)     Intol   . Olmesartan Cough  . Prednisone     REACTION: "retained fluid"   Patient Measurements: Height: 5\' 3"  (160 cm) Weight: 161 lb 8 oz (73.256 kg) IBW/kg (Calculated) : 52.4  Heparin Dosing Weight: 67 kg  Labs:  Basename 12/25/11 1120 12/25/11 0327 12/24/11 2221 12/24/11 1625  HGB -- 11.1* 10.8* --  HCT -- 33.7* 33.0* 37.0  PLT -- 189 192 225  APTT -- -- 37 --  LABPROT -- 24.1* 25.1* 21.7*  INR -- 2.12* 2.23* 1.85*  HEPARINUNFRC 0.13* <0.10* -- --  CREATININE -- 0.71 0.74 0.66  CKTOTAL 21 17 16  --  CKMB 2.5 2.3 2.1 --  TROPONINI <0.30 <0.30 <0.30 --    Estimated Creatinine Clearance: 54.7 ml/min (by C-G formula based on Cr of 0.71).  Assessment: 78 yo female with chest pain, h/o AFiib, Coumadin on hold for cardiac cath and patient on heparin with a subtherapeutic level. INR noted at 2.12  Goal of Therapy:  Heparin level 0.3-0.7 units/ml Monitor platelets by anticoagulation protocol: Yes   Plan:  -Will give a small heparin bolus of 1000 units and increase infusion to 1100 units/hr -Heparin level in 8hrs  Harland German, Pharm D 12/25/2011 12:36 PM

## 2011-12-25 NOTE — Progress Notes (Signed)
ANTICOAGULATION CONSULT NOTE   Pharmacy Consult for Heparin Indication: chest pain/ACS  Allergies  Allergen Reactions  . Ace Inhibitors Cough    Enalapril  benicor    . Crestor (Rosuvastatin Calcium)     Fatigue and leg pain   . Lipitor (Atorvastatin Calcium)     Myalgias   . Niaspan (Niacin)     Intol   . Olmesartan Cough  . Prednisone     REACTION: "retained fluid"   Patient Measurements: Height: 5\' 3"  (160 cm) Weight: 161 lb 8 oz (73.256 kg) IBW/kg (Calculated) : 52.4  Heparin Dosing Weight: 67 kg  Labs:  Basename 12/25/11 0327 12/24/11 2221 12/24/11 1625  HGB 11.1* 10.8* --  HCT 33.7* 33.0* 37.0  PLT 189 192 225  APTT -- 37 --  LABPROT 24.1* 25.1* 21.7*  INR 2.12* 2.23* 1.85*  HEPARINUNFRC <0.10* -- --  CREATININE 0.71 0.74 0.66  CKTOTAL 17 16 --  CKMB 2.3 2.1 --  TROPONINI <0.30 <0.30 <0.30    Estimated Creatinine Clearance: 54.7 ml/min (by C-G formula based on Cr of 0.71).  Assessment: 78 yo female with chest pain, h/o AFiib, Coumadin on hold, for Heparin  Goal of Therapy:  Heparin level 0.3-0.7 units/ml Monitor platelets by anticoagulation protocol: Yes   Plan:  Increase Heparin 900 units/hr Check heparin level in 8 hours.  Geannie Risen, PharmD, BCPS

## 2011-12-26 LAB — CBC
HCT: 37.1 % (ref 36.0–46.0)
Hemoglobin: 12.1 g/dL (ref 12.0–15.0)
MCH: 26.8 pg (ref 26.0–34.0)
MCHC: 32.6 g/dL (ref 30.0–36.0)
MCV: 82.1 fL (ref 78.0–100.0)

## 2011-12-26 LAB — GLUCOSE, CAPILLARY: Glucose-Capillary: 177 mg/dL — ABNORMAL HIGH (ref 70–99)

## 2011-12-26 LAB — PROTIME-INR: Prothrombin Time: 18.2 seconds — ABNORMAL HIGH (ref 11.6–15.2)

## 2011-12-26 LAB — HEPARIN LEVEL (UNFRACTIONATED): Heparin Unfractionated: 0.35 IU/mL (ref 0.30–0.70)

## 2011-12-26 MED ORDER — METOPROLOL TARTRATE 100 MG PO TABS
100.0000 mg | ORAL_TABLET | Freq: Two times a day (BID) | ORAL | Status: DC
  Administered 2011-12-26 – 2011-12-28 (×5): 100 mg via ORAL
  Filled 2011-12-26 (×10): qty 1

## 2011-12-26 NOTE — Progress Notes (Addendum)
SUBJECTIVE:  Headache resolved after Percocet.  No further chest pain  OBJECTIVE:   Vitals:   Filed Vitals:   12/25/11 0500 12/25/11 1500 12/25/11 2100 12/26/11 0500  BP: 184/75 155/63 166/62 158/91  Pulse: 72 67 71 67  Temp: 97.8 F (36.6 C) 98.1 F (36.7 C) 98.1 F (36.7 C) 97.7 F (36.5 C)  TempSrc: Oral  Oral Oral  Resp: 18 20 18 18  Height:      Weight:      SpO2: 93% 95% 96% 96%   I&O's:   Intake/Output Summary (Last 24 hours) at 12/26/11 0949 Last data filed at 12/25/11 2345  Gross per 24 hour  Intake    360 ml  Output      0 ml  Net    360 ml   TELEMETRY: Reviewed telemetry pt in NSR with frequent PAC's     PHYSICAL EXAM General: Well developed, well nourished, in no acute distress  Lungs:   Clear bilaterally to auscultation and percussion. Heart:   HRRR S1 S2 Pulses are 2+ & equal.Occasional ectopy Abdomen: Bowel sounds are positive, abdomen soft and non-tender without masses  Extremities:   No clubbing, cyanosis or edema.  DP +1 Neuro: Alert and oriented X 3. Psych:  Good affect, responds appropriately   LABS: Basic Metabolic Panel:  Basename 12/25/11 0327 12/24/11 2221  NA 139 138  K 4.3 4.0  CL 101 101  CO2 29 26  GLUCOSE 266* 234*  BUN 18 20  CREATININE 0.71 0.74  CALCIUM 10.4 10.3  MG -- 1.8  PHOS -- --   Liver Function Tests:  Basename 12/24/11 2221 12/24/11 1625  AST 9 12  ALT 7 7  ALKPHOS 65 75  BILITOT 0.4 0.3  PROT 6.4 7.2  ALBUMIN 3.0* 3.5   No results found for this basename: LIPASE:2,AMYLASE:2 in the last 72 hours CBC:  Basename 12/26/11 0840 12/25/11 0327 12/24/11 2221 12/24/11 1625  WBC 5.7 5.6 -- --  NEUTROABS -- -- 4.4 7.2  HGB 12.1 11.1* -- --  HCT 37.1 33.7* -- --  MCV 82.1 81.2 -- --  PLT 179 189 -- --   Cardiac Enzymes:  Basename 12/25/11 1120 12/25/11 0327 12/24/11 2221  CKTOTAL 21 17 16  CKMB 2.5 2.3 2.1  CKMBINDEX -- -- --  TROPONINI <0.30 <0.30 <0.30   BNP: No components found with this basename:  POCBNP:3 D-Dimer:  Basename 12/25/11 1400  DDIMER <0.22   Coag Panel:   Lab Results  Component Value Date   INR 2.12* 12/25/2011   INR 2.23* 12/24/2011   INR 1.85* 12/24/2011    RADIOLOGY: Dg Chest 2 View  12/24/2011  *RADIOLOGY REPORT*  Clinical Data: Loss of consciousness.  Weakness.  CHEST - 2 VIEW  Comparison: Chest x-ray 11/16/2011.  Findings: Lung volumes are low.  No consolidative airspace disease. No pleural effusions.  Calcified granuloma in the left mid lung unchanged.  Mild pulmonary venous congestion without frank pulmonary edema.  Heart size is borderline enlarged.  Mediastinal contours are unremarkable.  Atherosclerotic calcifications are noted within the arch of the aorta.  IMPRESSION: 1.  Low lung volumes without radiographic evidence of acute cardiopulmonary disease. 2.  Atherosclerosis.  Original Report Authenticated By: DANIEL W. ENTRIKIN, M.D.   Ct Head Wo Contrast  12/24/2011  *RADIOLOGY REPORT*  Clinical Data: Loss of consciousness, syncope.  CT HEAD WITHOUT CONTRAST  Technique:  Contiguous axial images were obtained from the base of the skull through the vertex without contrast.    Comparison: 08/31/2011  Findings: There is atrophy and chronic small vessel disease changes. No acute intracranial abnormality.  Specifically, no hemorrhage, hydrocephalus, mass lesion, acute infarction, or significant intracranial injury.  No acute calvarial abnormality. Visualized paranasal sinuses and mastoids clear.  Orbital soft tissues unremarkable.  IMPRESSION: No acute intracranial abnormality.  Atrophy, chronic microvascular disease.  Original Report Authenticated By: KEVIN G. DOVER, M.D.      ASSESSMENT:  1. Syncope while sitting in a chair. ? Arrhythmia or progression of underlying CAD  2. Atrial fibrillation with prolonged QTc on Tikosyn. Her Tikosyn stopped due to persistently elevated QTc despite decreasing dose. 3. HTN poorly controlled despite increasing Metoprolol dose 4. CAD    5. DM   PLAN:   1.  Cardiac cath tomorrow 2.  Check INR today - coumadin on hold 3.  EP to determine different antiarrhythmic agent for afib suppression since QTC increased on Tikosyn which is now d/c'd 4.  Increase Metoprolol to 100mg BID for BP control and also suppress PAC's 5.  Will hold Metformin today for cath in am  Bransen Fassnacht R, MD  12/26/2011  9:49 AM   

## 2011-12-27 ENCOUNTER — Encounter (HOSPITAL_COMMUNITY)
Admission: EM | Disposition: A | Discharge status: Home / Self Care | Payer: Self-pay | Source: Home / Self Care | Attending: Cardiology

## 2011-12-27 DIAGNOSIS — I4891 Unspecified atrial fibrillation: Secondary | ICD-10-CM

## 2011-12-27 DIAGNOSIS — I2510 Atherosclerotic heart disease of native coronary artery without angina pectoris: Secondary | ICD-10-CM

## 2011-12-27 HISTORY — PX: LEFT HEART CATHETERIZATION WITH CORONARY ANGIOGRAM: SHX5451

## 2011-12-27 LAB — CBC
Hemoglobin: 11.3 g/dL — ABNORMAL LOW (ref 12.0–15.0)
MCV: 82.1 fL (ref 78.0–100.0)
Platelets: 203 10*3/uL (ref 150–400)
RBC: 4.19 MIL/uL (ref 3.87–5.11)
WBC: 5.8 10*3/uL (ref 4.0–10.5)

## 2011-12-27 LAB — GLUCOSE, CAPILLARY
Glucose-Capillary: 224 mg/dL — ABNORMAL HIGH (ref 70–99)
Glucose-Capillary: 68 mg/dL — ABNORMAL LOW (ref 70–99)
Glucose-Capillary: 110 mg/dL — ABNORMAL HIGH (ref 70–99)

## 2011-12-27 SURGERY — LEFT HEART CATHETERIZATION WITH CORONARY ANGIOGRAM
Anesthesia: LOCAL

## 2011-12-27 MED ORDER — SODIUM CHLORIDE 0.9 % IV SOLN
INTRAVENOUS | Status: AC
  Administered 2011-12-27: 12:00:00 via INTRAVENOUS

## 2011-12-27 MED ORDER — AMIODARONE HCL 200 MG PO TABS
400.0000 mg | ORAL_TABLET | Freq: Two times a day (BID) | ORAL | Status: DC
  Administered 2011-12-27 – 2011-12-28 (×2): 400 mg via ORAL
  Filled 2011-12-27 (×5): qty 2

## 2011-12-27 MED ORDER — LIDOCAINE HCL (PF) 1 % IJ SOLN
INTRAMUSCULAR | Status: AC
  Filled 2011-12-27: qty 30

## 2011-12-27 MED ORDER — WARFARIN - PHARMACIST DOSING INPATIENT: Freq: Every day | Status: DC

## 2011-12-27 MED ORDER — ASPIRIN EC 81 MG PO TBEC
81.0000 mg | DELAYED_RELEASE_TABLET | Freq: Every day | ORAL | Status: DC
  Filled 2011-12-27: qty 1

## 2011-12-27 MED ORDER — HEPARIN (PORCINE) IN NACL 2-0.9 UNIT/ML-% IJ SOLN
INTRAMUSCULAR | Status: AC
  Filled 2011-12-27: qty 2000

## 2011-12-27 MED ORDER — HEPARIN SODIUM (PORCINE) 1000 UNIT/ML IJ SOLN
INTRAMUSCULAR | Status: AC
  Filled 2011-12-27: qty 1

## 2011-12-27 MED ORDER — WARFARIN SODIUM 10 MG PO TABS
10.0000 mg | ORAL_TABLET | Freq: Once | ORAL | Status: AC
  Administered 2011-12-27: 10 mg via ORAL
  Filled 2011-12-27: qty 1

## 2011-12-27 MED ORDER — VERAPAMIL HCL 2.5 MG/ML IV SOLN
INTRAVENOUS | Status: AC
  Filled 2011-12-27: qty 2

## 2011-12-27 MED ORDER — FENTANYL CITRATE 0.05 MG/ML IJ SOLN
INTRAMUSCULAR | Status: AC
  Filled 2011-12-27: qty 2

## 2011-12-27 MED ORDER — NITROGLYCERIN 0.2 MG/ML ON CALL CATH LAB
INTRAVENOUS | Status: AC
  Filled 2011-12-27: qty 1

## 2011-12-27 MED ORDER — MIDAZOLAM HCL 2 MG/2ML IJ SOLN
INTRAMUSCULAR | Status: AC
  Filled 2011-12-27: qty 2

## 2011-12-27 MED ORDER — HYDRALAZINE HCL 20 MG/ML IJ SOLN
INTRAMUSCULAR | Status: AC
  Filled 2011-12-27: qty 1

## 2011-12-27 MED ORDER — ASPIRIN 81 MG PO CHEW
324.0000 mg | CHEWABLE_TABLET | ORAL | Status: AC
  Administered 2011-12-27: 324 mg via ORAL
  Filled 2011-12-27: qty 4

## 2011-12-27 NOTE — CV Procedure (Signed)
   Cardiac Catheterization Operative Report  Alexandria [name] [id number] 7/1/20138:27 AM [name], MD  Procedure Performed:  1. Left Heart Catheterization 2. Selective Coronary Angiography 3. Left ventricular angiogram  Operator: Verne Carrow, MD  Indication:  Chest pain, known CAD, DES placed in proximal Circumflex in February 2013. Negative cardiac enzymes.                             Procedure Details: The risks, benefits, complications, treatment options, and expected outcomes were discussed with the patient. The patient and/or family concurred with the proposed plan, giving informed consent. The patient was brought to the cath lab after IV hydration was begun and oral premedication was given. The patient was further sedated with Versed and Fentanyl. Allens test was positive on the right wrist. The right wrist was prepped and draped in a sterile fashion. I initially attempted access into the right radial artery but was unsuccessful. The right groin was prepped and draped in the usual manner. Using the modified Seldinger access technique, a 5 French sheath was placed in the right femoral artery. Standard diagnostic catheters were used to perform selective coronary angiography. A pigtail catheter was used to perform a left ventricular angiogram.  There were no immediate complications. The patient was taken to the recovery area in stable condition.   Hemodynamic Findings: Central aortic pressure: 176/71 Left ventricular pressure: 188/2/12  Angiographic Findings:  Left main: Short segment, no obstructive disease.   Left Anterior Descending Artery: Moderate to large sized vessel that does not reach the apex. The mid vessel after the takeoff of the diagonal branch has a tubular 30% stenosis. The first diagonal branch is a small caliber, bifurcating vessel with diffuse 50% stenosis throughout the proximal segment and diffuse 90% stenosis in the inferior sub-branch. This is  unchanged since the last catheterization and would be too small in diameter to consider PCI.   Circumflex Artery: Patent stent proximally with no restenosis.  Distal vessel with 30% stenosis. The first and second obtuse marginal branches are very small with plaque disease. The third obtuse marginal branch is a very small caliber vessel (~1.67mm) and has a 95% ostial stenosis. This vessel is too small to consider PCI. This is unchanged from last cath.   Right Coronary Artery: Large dominant vessel with diffuse 30% mid vessel stenosis. There is calcification throughout the mid vessel. Mild plaque distally. No obstructive lesions noted.   Left Ventricular Angiogram: LVEF=65%.   Impression: 1. Single vessel CAD with patent stent in the proximal Circumflex. 2. There is also small branch vessel disease but no vessels that are large enough for PCI. 2. Normal LV function.   Recommendations: No further ischemic workup.        Complications:  None. The patient tolerated the procedure well.

## 2011-12-27 NOTE — H&P (View-Only) (Signed)
SUBJECTIVE:  Headache resolved after Percocet.  No further chest pain  OBJECTIVE:   Vitals:   Filed Vitals:   12/25/11 0500 12/25/11 1500 12/25/11 2100 12/26/11 0500  BP: 184/75 155/63 166/62 158/91  Pulse: 72 67 71 67  Temp: 97.8 F (36.6 C) 98.1 F (36.7 C) 98.1 F (36.7 C) 97.7 F (36.5 C)  TempSrc: Oral  Oral Oral  Resp: 18 20 18 18   Height:      Weight:      SpO2: 93% 95% 96% 96%   I&O's:   Intake/Output Summary (Last 24 hours) at 12/26/11 0949 Last data filed at 12/25/11 2345  Gross per 24 hour  Intake    360 ml  Output      0 ml  Net    360 ml   TELEMETRY: Reviewed telemetry pt in NSR with frequent PAC's     PHYSICAL EXAM General: Well developed, well nourished, in no acute distress  Lungs:   Clear bilaterally to auscultation and percussion. Heart:   HRRR S1 S2 Pulses are 2+ & equal.Occasional ectopy Abdomen: Bowel sounds are positive, abdomen soft and non-tender without masses  Extremities:   No clubbing, cyanosis or edema.  DP +1 Neuro: Alert and oriented X 3. Psych:  Good affect, responds appropriately   LABS: Basic Metabolic Panel:  Basename 12/25/11 0327 12/24/11 2221  NA 139 138  K 4.3 4.0  CL 101 101  CO2 29 26  GLUCOSE 266* 234*  BUN 18 20  CREATININE 0.71 0.74  CALCIUM 10.4 10.3  MG -- 1.8  PHOS -- --   Liver Function Tests:  [facility] 12/24/11 2221 12/24/11 1625  AST 9 12  ALT 7 7  ALKPHOS 65 75  BILITOT 0.4 0.3  PROT 6.4 7.2  ALBUMIN 3.0* 3.5   No results found for this basename: LIPASE:2,AMYLASE:2 in the last 72 hours CBC:  Basename 12/26/11 0840 12/25/11 0327 12/24/11 2221 12/24/11 1625  WBC 5.7 5.6 -- --  NEUTROABS -- -- 4.4 7.2  HGB 12.1 11.1* -- --  HCT 37.1 33.7* -- --  MCV 82.1 81.2 -- --  PLT 179 189 -- --   Cardiac Enzymes:  Basename 12/25/11 1120 12/25/11 0327 12/24/11 2221  CKTOTAL 21 17 16   CKMB 2.5 2.3 2.1  CKMBINDEX -- -- --  TROPONINI <0.30 <0.30 <0.30   BNP: No components found with this basename:  POCBNP:3 D-Dimer:  Basename 12/25/11 1400  DDIMER <0.22   Coag Panel:   Lab Results  Component Value Date   INR 2.12* 12/25/2011   INR 2.23* 12/24/2011   INR 1.85* 12/24/2011    RADIOLOGY: Dg Chest 2 View  12/24/2011  *RADIOLOGY REPORT*  Clinical Data: Loss of consciousness.  Weakness.  CHEST - 2 VIEW  Comparison: Chest x-ray 11/16/2011.  Findings: Lung volumes are low.  No consolidative airspace disease. No pleural effusions.  Calcified granuloma in the left mid lung unchanged.  Mild pulmonary venous congestion without frank pulmonary edema.  Heart size is borderline enlarged.  Mediastinal contours are unremarkable.  Atherosclerotic calcifications are noted within the arch of the aorta.  IMPRESSION: 1.  Low lung volumes without radiographic evidence of acute cardiopulmonary disease. 2.  Atherosclerosis.  Original Report Authenticated By: Florencia Reasons, M.D.   Ct Head Wo Contrast  12/24/2011  *RADIOLOGY REPORT*  Clinical Data: Loss of consciousness, syncope.  CT HEAD WITHOUT CONTRAST  Technique:  Contiguous axial images were obtained from the base of the skull through the vertex without contrast.  Comparison: 08/31/2011  Findings: There is atrophy and chronic small vessel disease changes. No acute intracranial abnormality.  Specifically, no hemorrhage, hydrocephalus, mass lesion, acute infarction, or significant intracranial injury.  No acute calvarial abnormality. Visualized paranasal sinuses and mastoids clear.  Orbital soft tissues unremarkable.  IMPRESSION: No acute intracranial abnormality.  Atrophy, chronic microvascular disease.  Original Report Authenticated By: Cyndie Chime, M.D.      ASSESSMENT:  1. Syncope while sitting in a chair. ? Arrhythmia or progression of underlying CAD  2. Atrial fibrillation with prolonged QTc on Tikosyn. Her Tikosyn stopped due to persistently elevated QTc despite decreasing dose. 3. HTN poorly controlled despite increasing Metoprolol dose 4. CAD    5. DM   PLAN:   1.  Cardiac cath tomorrow 2.  Check INR today - coumadin on hold 3.  EP to determine different antiarrhythmic agent for afib suppression since QTC increased on Tikosyn which is now d/c'd 4.  Increase Metoprolol to 100mg  BID for BP control and also suppress PAC's 5.  Will hold Metformin today for cath in am  Quintella Reichert, MD  12/26/2011  9:49 AM

## 2011-12-27 NOTE — Interval H&P Note (Signed)
History and Physical Interval Note:  12/27/2011 7:35 AM  Alexandria [name]  has presented today for surgery, with the diagnosis of cp  The various methods of treatment have been discussed with the patient and family. After consideration of risks, benefits and other options for treatment, the patient has consented to  Procedure(s) (LRB): LEFT HEART CATHETERIZATION WITH CORONARY ANGIOGRAM (N/A) as a surgical intervention .  The patient's history has been reviewed, patient examined, no change in status, stable for surgery.  I have reviewed the patients' chart and labs.  Questions were answered to the patient's satisfaction.     Sevan Mcbroom

## 2011-12-27 NOTE — Consult Note (Signed)
ELECTROPHYSIOLOGY CONSULT NOTE  Patient ID: Alexandria [name] MRN: [id number], DOB/AGE: April 09, 1930   Admit date: 12/24/2011 Date of Consult: 12/27/2011  Primary Physician: [name], MD Primary Cardiologist: Antoine Poche, MD Primary Electrophysiologist: Johney Frame, MD Reason for Consultation: Atrial fibrillation/atrial flutter; Antiarrhythmic medication recommendations  History of Present Illness Alexandria [name] is a pleasant 78 year old woman with CAD, chronic diastolic CHF, PAF, HTN, DM and pulmonary fibrosis who has been admitted with syncope. 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 She does not recall much of the event, stating "the last thing I remember is sitting, talking with my friend while I was waiting, then woke up with a stretcher and EMS beside me." She denies any warning symptoms or prodrome. She reports intermittent dizziness with postural/positional changes but has never experienced syncope previously. She denies chest pain, shortness of breath or palpitations. She did not injure herself. On admission, she was found to have QT prolongation and her Tikosyn was discontinued.   Past Medical History  Diagnosis Date  . Hypertension     with h/o hypertensive urgency  . Glaucoma   . Tremor, essential   . Diabetes mellitus with neuropathy   . B12 deficiency   . Hyperlipidemia   . Atrial fibrillation     a. on chronic coumadin;  b. Tiksosyn adjusted to 250 mcg bid 08/2011 admission  . Chronic diastolic heart failure     echo 05/2009: mild LVH, EF 55-60%, grade 2 diast dysfxn, mild LAE, PASP  . CAD (coronary artery disease)     a.  LHC 06/2009: pLAD 40%, prox-mid D1 80% (small);  CFX 40-50%, mRCA 20%, EF 65%  -  med Rx;  b.  05/23/11 Myoview - EF 53%, no isch/inf;  c. 07/2011 NSTEMI - PCI/DES LCX - Resolute stent  . Carotid stenosis     dopplers 6/12: 1-39% bilat ICA  . Pulmonary fibrosis       Dr. Vassie Loll;  patient taken off O2 in 8/12; dyspnea felt CHF>>ILD  . Skin cancer   . Warfarin anticoagulation   . Hypomagnesemia   . Colon polyps     Past Surgical History  Procedure Date  . Appendectomy   . Nose surgery   . Cardiac catheterization 2013    stent  . Colonoscopy    Allergies/Intolerances Allergen Reactions  . Ace Inhibitors Cough  . Crestor (Rosuvastatin Calcium) Fatigue and leg pain  . Lipitor (Atorvastatin Calcium) Myalgias  . Niaspan (Niacin)   . Olmesartan Cough  . Prednisone "retained fluid"   Inpatient Medications . aspirin  324 mg Oral Pre-Cath  . aspirin EC  81 mg Oral Daily  . cholecalciferol  1,000 Units Oral Daily  . clopidogrel  75 mg Oral Q breakfast  . colesevelam  1,250 mg Oral Q supper  . furosemide  20 mg Oral Daily  . insulin aspart protamine-insulin aspart  20 Units Subcutaneous Q supper  . insulin aspart protamine-insulin aspart  34 Units Subcutaneous Q breakfast  . isosorbide mononitrate  60 mg Oral Daily  . latanoprost  1 drop Both Eyes QHS  . magnesium oxide  400 mg Oral TID  . metoprolol  100 mg Oral BID  . potassium chloride  10 mEq Oral Daily   Family History Problem Relation Age of Onset  . Throat cancer Brother   . Prostate cancer Father   . Diabetes Mother   . Diabetes Brother  multiple  . Heart disease Brother   . Colon cancer Neg Hx     Social History Social History  . Marital Status: Widowed   Occupational History  . retired    Social History Main Topics  . Smoking status: Never Smoker   . Smokeless tobacco: Never Used  . Alcohol Use: No  . Drug Use: No  . Sexually Active: No   Review of Systems General:  No chills, fever, night sweats or weight changes  Cardiovascular:  No dyspnea on exertion, edema, orthopnea, paroxysmal nocturnal dyspnea Dermatological: No rash, lesions or masses Respiratory: No cough, dyspnea Urologic: No hematuria, dysuria Abdominal:   No nausea, vomiting, diarrhea, bright red  blood per rectum, melena, or hematemesis Neurologic:  No visual changes or changes in mental status All other systems reviewed and are otherwise negative except as noted above.  Physical Exam Blood pressure 155/73, pulse 65, temperature 97.9 F (36.6 C), temperature source Oral, resp. rate 18, height 5\' 3"  (1.6 m), weight 161 lb 8 oz (73.256 kg), SpO2 97.00%.  General: Well developed, well appearing 78 year old female in no acute distress. HEENT: Normocephalic, atraumatic. EOMs intact. Sclera nonicteric. Oropharynx clear.  Neck: Supple. Bilateral carotid bruits, left >right. No JVD. Lungs: Respirations regular and unlabored, CTA bilaterally. No wheezes, rales or rhonchi. Heart: RRR. S1, S2 present. No murmurs, rub, S3 or S4. Abdomen: Soft, non-distended. BS present x 4 quadrants. Faint abdominal bruit at midline. Extremities: No clubbing, cyanosis or edema. DP/PT/Radials 2+ and equal bilaterally. Psych: Normal affect. Neuro: Alert and oriented X 3. Moves all extremities spontaneously.   Labs  Basename 12/25/11 1120 12/25/11 0327 12/24/11 2221 12/24/11 1625  CKTOTAL 21 17 16  --  CKMB 2.5 2.3 2.1 --  TROPONINI <0.30 <0.30 <0.30 <0.30   Lab Results  Component Value Date   WBC 5.8 12/27/2011   HGB 11.3* 12/27/2011   HCT 34.4* 12/27/2011   MCV 82.1 12/27/2011   PLT 203 12/27/2011    Lab 12/25/11 0327 12/24/11 2221  NA 139 --  K 4.3 --  CL 101 --  CO2 29 --  BUN 18 --  CREATININE 0.71 --  CALCIUM 10.4 --  PROT -- 6.4  BILITOT -- 0.4  ALKPHOS -- 65  ALT -- 7  AST -- 9  GLUCOSE 266* --   No components found with this basename: MAGNESIUM No components found with this basename: POCBNP:3  Lab Results  Component Value Date   DDIMER <0.22 12/25/2011   No results found for this basename: TSH,T4TOTAL,FREET3,T3FREE,THYROIDAB in the last 72 hours   Basename 12/27/11 0524  INR 1.30    Radiology/Studies Dg Chest 2 View 12/24/2011  *RADIOLOGY REPORT*  Clinical Data: Loss of  consciousness.  Weakness.  CHEST - 2 VIEW  Comparison: Chest x-ray 11/16/2011.  Findings: Lung volumes are low.  No consolidative airspace disease. No pleural effusions.  Calcified granuloma in the left mid lung unchanged.  Mild pulmonary venous congestion without frank pulmonary edema.  Heart size is borderline enlarged.  Mediastinal contours are unremarkable.  Atherosclerotic calcifications are noted within the arch of the aorta.  IMPRESSION: 1.  Low lung volumes without radiographic evidence of acute cardiopulmonary disease. 2.  Atherosclerosis.  Original Report Authenticated By: Florencia Reasons, M.D.   Ct Head Wo Contrast 12/24/2011  *RADIOLOGY REPORT*  Clinical Data: Loss of consciousness, syncope.  CT HEAD WITHOUT CONTRAST  Technique:  Contiguous axial images were obtained from the base of the skull through the vertex without contrast.  Comparison: 08/31/2011  Findings: There is atrophy and chronic small vessel disease changes. No acute intracranial abnormality.  Specifically, no hemorrhage, hydrocephalus, mass lesion, acute infarction, or significant intracranial injury.  No acute calvarial abnormality. Visualized paranasal sinuses and mastoids clear.  Orbital soft tissues unremarkable.  IMPRESSION: No acute intracranial abnormality.  Atrophy, chronic microvascular disease.  Original Report Authenticated By: Cyndie Chime, M.D.   Echocardiogram from March 2013 Normal LV systolic function, EF 55-60%, moderate LVH, normal RV size and function, mild AS, mild TR, normal atria 12-lead ECG from admission 12/24/2011 shows normal sinus rhythm at 71 bpm; PR 188 ms; QRS 94 ms; QT/QTc 460/500 ms Telemetry normal sinus rhythm  Assessment and Plan 1. Syncope 2. Atrial fibrillation, paroxysmal - symptomatic; normal atria 3. QT prolongation on Tikosyn - now discontinued 4. Pulmonary fibrosis  The patient was seen, examined and plan of care formulated with Dr. Sherryl Manges. Signed, Rick Duff,  PA-C 12/27/2011, 11:38 AM  The situation is complicated and well assessed by Dr Fawn Kirk in the April consult.  For now I would recommend the use of amiodarone with close surveillance for evidence of pulmonary complications. We will arrange for Dr [facility] and Fawn Kirk to discuss the potential role of catheter ablation as an alternative.  antoher consideration would be AV ablation and pacing in this lady with lung disease  Resume coumadin and consider stopping ASA   Could also consider NOAC  (novel oral anticoagulation)  TSH nl 4/13; LFTs nl 6/13  Sherryl Manges, MD 12/27/2011 10:08 PM

## 2011-12-27 NOTE — Progress Notes (Signed)
Pt refusing bed alarm.  Pt reports syncopal episode prior to admission. Pt given education on fall risk and fall prevention.  Pt states she does not want to have a bed alarm.  RN and NT reinforced need to call for assistance when needing to ambulate or for restroom assistance.  Will continue to provide education and offer/encourage bed alarm. Efraim Kaufmann

## 2011-12-27 NOTE — Progress Notes (Signed)
ANTICOAGULATION CONSULT NOTE   Pharmacy Consult for Warfarin Indication: Afib  Allergies  Allergen Reactions  . Ace Inhibitors Cough    Enalapril  benicor    . Crestor (Rosuvastatin Calcium)     Fatigue and leg pain   . Lipitor (Atorvastatin Calcium)     Myalgias   . Niaspan (Niacin)     Intol   . Olmesartan Cough  . Prednisone     REACTION: "retained fluid"   Patient Measurements: Height: 5\' 3"  (160 cm) Weight: 161 lb 8 oz (73.256 kg) IBW/kg (Calculated) : 52.4  Heparin Dosing Weight: 67 kg  Labs:  Basename 12/27/11 0524 12/26/11 1800 12/26/11 0840 12/25/11 2100 12/25/11 1120 12/25/11 0327 12/24/11 2221 12/24/11 1625  HGB 11.3* -- 12.1 -- -- -- -- --  HCT 34.4* -- 37.1 -- -- 33.7* -- --  PLT 203 -- 179 -- -- 189 -- --  APTT -- -- -- -- -- -- 37 --  LABPROT 16.4* 18.2* -- -- -- 24.1* -- --  INR 1.30 1.48 -- -- -- 2.12* -- --  HEPARINUNFRC 0.47 -- 0.35 0.26* -- -- -- --  CREATININE -- -- -- -- -- 0.71 0.74 0.66  CKTOTAL -- -- -- -- 21 17 16  --  CKMB -- -- -- -- 2.5 2.3 2.1 --  TROPONINI -- -- -- -- <0.30 <0.30 <0.30 --    Estimated Creatinine Clearance: 54.7 ml/min (by C-G formula based on Cr of 0.71).  Assessment: 78 yo female with chest pain, h/o AFib, Coumadin Admitting INR noted at 2.12.  She was taking 10 mg Warfarin on TTHS and 5 mg on MWFSu.  Today her INR is at 1.30, H/H is stable at 11.3/34.4 without noted bleeding complications.  Goal of Therapy:  Heparin level 0.3-0.7 units/ml Monitor platelets by anticoagulation protocol: Yes   Plan:   Resume her Warfarin regimen with 10mg  today and tomorrow then start her back on her maintenance regimen.  Monitor for bleeding complications and daily PT/INR as well as H/H and platelets.   Nadara Mustard, PharmD., MS Clinical Pharmacist Pager:  [phone]  Thank you for allowing pharmacy to be part of this patients care team. 12/27/2011 1:13 PM

## 2011-12-28 ENCOUNTER — Encounter (HOSPITAL_COMMUNITY): Payer: Self-pay | Admitting: Cardiology

## 2011-12-28 LAB — BASIC METABOLIC PANEL
CO2: 25 mEq/L (ref 19–32)
Chloride: 104 mEq/L (ref 96–112)
Creatinine, Ser: 0.62 mg/dL (ref 0.50–1.10)
Glucose, Bld: 200 mg/dL — ABNORMAL HIGH (ref 70–99)

## 2011-12-28 LAB — CBC WITH DIFFERENTIAL/PLATELET
Eosinophils Relative: 3 % (ref 0–5)
HCT: 35.3 % — ABNORMAL LOW (ref 36.0–46.0)
Lymphocytes Relative: 18 % (ref 12–46)
Lymphs Abs: 1.3 10*3/uL (ref 0.7–4.0)
MCH: 26.5 pg (ref 26.0–34.0)
MCV: 82.1 fL (ref 78.0–100.0)
Monocytes Absolute: 0.7 10*3/uL (ref 0.1–1.0)
RBC: 4.3 MIL/uL (ref 3.87–5.11)
RDW: 14 % (ref 11.5–15.5)
WBC: 7.4 10*3/uL (ref 4.0–10.5)

## 2011-12-28 LAB — PROTIME-INR: INR: 1.25 (ref 0.00–1.49)

## 2011-12-28 MED ORDER — WARFARIN SODIUM 5 MG PO TABS: 5.0000 mg | ORAL_TABLET | Freq: Every day | ORAL | Status: DC

## 2011-12-28 MED ORDER — METOPROLOL TARTRATE 100 MG PO TABS: 100.0000 mg | ORAL_TABLET | Freq: Two times a day (BID) | ORAL | Status: DC

## 2011-12-28 MED ORDER — METFORMIN HCL 1000 MG PO TABS: 1000.0000 mg | ORAL_TABLET | Freq: Two times a day (BID) | ORAL | Status: DC

## 2011-12-28 MED ORDER — AMIODARONE HCL 400 MG PO TABS: 400.0000 mg | ORAL_TABLET | Freq: Two times a day (BID) | ORAL | Status: DC

## 2011-12-28 MED ORDER — WARFARIN SODIUM 5 MG PO TABS
5.0000 mg | ORAL_TABLET | Freq: Once | ORAL | Status: DC
  Filled 2011-12-28: qty 1

## 2011-12-28 NOTE — Discharge Summary (Signed)
Discharge Summary   Patient ID: Alexandria [name] MRN: [id number], DOB/AGE: [id number] 76 y.o.  Primary MD: [name], MD Primary Cardiologist: [name] MD Admit date: 12/24/2011 D/C date:     12/28/2011      Primary Discharge Diagnoses:  1. Syncope  - Etiology unknown  - No objective evidence of ischemia, no arrhythmias on telemetry, no orthostatic hypotension, CT head w/o acute abnormalities  2. Paroxysmal Atrial Fibrillation  - On chronic coumadin  - Tikosyn dc'd 2/2 prolonged QT, initiated on Amiodarone this admission  3. Coronary Artery Disease  - LHC 06/2009: pLAD 40%, prox-mid D1 80% (small); CFX 40-50%, mRCA 20%, EF 65% - med Rx; 07/2011 NSTEMI - PCI/DES LCX - Resolute stent   - Cath this admission revealed patent stent and small vessel dz   Secondary Discharge Diagnoses:  1. Hypertension with h/o hypertensive urgency   2. Hyperlipidemia   3. Chronic diastolic heart failure echo 05/2009: mild LVH, EF 55-60%, grade 2 diast dysfxn, mild LAE, PASP  4. Carotid stenosis dopplers 6/12: 1-39% bilat ICA  5. Diabetes mellitus with neuropathy  6. Pulmonary fibrosis Dr. Vassie Loll; patient taken off O2 in 8/12; dyspnea felt CHF>>ILD 7. Glaucoma   8. Tremor, essential   9. B12 deficiency   10. Skin cancer   11. Hypomagnesemia   12. Colon polyps  13. Appendectomy  14. Nose surgery   Allergies Allergies  Allergen Reactions  . Ace Inhibitors Cough    Enalapril  benicor    . Crestor (Rosuvastatin Calcium)     Fatigue and leg pain   . Lipitor (Atorvastatin Calcium)     Myalgias   . Niaspan (Niacin)     Intol   . Olmesartan Cough  . Prednisone     REACTION: "retained fluid"    Diagnostic Studies/Procedures:   12/24/2011 - Ct Head Wo Contrast Findings: There is atrophy and chronic small vessel disease changes. No acute intracranial abnormality.  Specifically, no hemorrhage, hydrocephalus, mass lesion, acute infarction, or significant intracranial injury.  No acute  calvarial abnormality. Visualized paranasal sinuses and mastoids clear.  Orbital soft tissues unremarkable.  IMPRESSION: No acute intracranial abnormality.  Atrophy, chronic microvascular disease.    12/27/11 - Cardiac Cath Hemodynamic Findings:  Central aortic pressure: 176/71  Left ventricular pressure: 188/2/12  Angiographic Findings:  Left main: Short segment, no obstructive disease.  Left Anterior Descending Artery: Moderate to large sized vessel that does not reach the apex. The mid vessel after the takeoff of the diagonal branch has a tubular 30% stenosis. The first diagonal branch is a small caliber, bifurcating vessel with diffuse 50% stenosis throughout the proximal segment and diffuse 90% stenosis in the inferior sub-branch. This is unchanged since the last catheterization and would be too small in diameter to consider PCI.  Circumflex Artery: Patent stent proximally with no restenosis. Distal vessel with 30% stenosis. The first and second obtuse marginal branches are very small with plaque disease. The third obtuse marginal branch is a very small caliber vessel (~1.37mm) and has a 95% ostial stenosis. This vessel is too small to consider PCI. This is unchanged from last cath.  Right Coronary Artery: Large dominant vessel with diffuse 30% mid vessel stenosis. There is calcification throughout the mid vessel. Mild plaque distally. No obstructive lesions noted.  Left Ventricular Angiogram: LVEF=65%.  Impression:  1. Single vessel CAD with patent stent in the proximal Circumflex.  2. There is also small branch vessel disease but no vessels that are large enough for PCI.  2. Normal LV function.  Recommendations: No further ischemic workup.   History of Present Illness: 78 y.o. female w/ the above medical problems who presented to [facility] on 12/24/11 after a syncopal episode.  The morning of presentation she had chest discomfort at rest that was more severe than prior episodes for  which she took nitroglycerin. It started in her left hand and radiated across her chest her right and persisted for up to 45 minutes. She went to her hairdresser and was feeling weak and apparently had a syncopal episode. She says she didn't pass out completely but she doesn't recall any events until the ambulance was at her side.  Hospital Course: EKG revealed NSR, prolonged QTc, no acute ST changes. CXR was without acute cardiopulmonary abnormalities. Labs were significant for normal cardiac enzymes, normal DDimer, and unremarkable CBC/CMET. She was admitted for further evaluation and treatment.   CT head was without acute intracranial abnormality. She was not orthostatic. Cardiac enzymes were cycled and remained negative. Given her cardiac history with ongoing chest pain and syncope there was concern of progression of her CAD. Coumadin was held with plans for cardiac cath. Cardiac cath was performed on 12/27/11 revealing patent LCx stent and otherwise small vessel dz not large enough for PCI. She tolerated the procedure well without complications. She had no further chest pain.  Her QTc was prolonged on admission for which Tikosyn dose was decreased, however, QTc remained prolonged and Tikosyn was discontinued. Dr. Graciela Husbands evaluated her and recommended amiodarone with close surveillance for evidence of pulmonary complications given her history of pulmonary fibrosis. Further options regarding a.fib ablation or AV nodal ablation w/ pacing will be discussed as an outpatient. She was continued on coumadin which was adjusted given concomitant amiodarone use with consideration of noval oral anticoagulant to be discussed as an outpatient.  She was seen and evaluated by Dr. Antoine Poche who felt she was stable for discharge home with plans for follow up as scheduled below.  Discharge Vitals: Blood pressure 160/58, pulse 65, temperature 98.4 F (36.9 C), temperature source Oral, resp. rate 16, height 5\' 3"  (1.6 m),  weight 161 lb 8 oz (73.256 kg), SpO2 97.00%.  Labs: Component Value Date   WBC 7.4 12/28/2011   HGB 11.4* 12/28/2011   HCT 35.3* 12/28/2011   MCV 82.1 12/28/2011   PLT 191 12/28/2011     12/28/2011 05:27  Prothrombin Time 16.0 (H)  INR 1.25   Lab 12/28/11 0527 12/24/11 2221  NA 139 --  K 4.3 --  CL 104 --  CO2 25 --  BUN 17 --  CREATININE 0.62 --  CALCIUM 10.3 --  PROT -- 6.4  BILITOT -- 0.4  ALKPHOS -- 65  ALT -- 7  AST -- 9  GLUCOSE 200* --    12/24/2011 22:21 12/25/2011 03:27 12/25/2011 11:20  CK, MB 2.1 2.3 2.5  CK Total 16 17 21   Troponin I <0.30 <0.30 <0.30   Component Value Date   DDIMER <0.22 12/25/2011     Discharge Medications   Medication List  As of 12/28/2011 10:38 AM   STOP taking these medications         dofetilide 250 MCG capsule         TAKE these medications         amiodarone 400 MG tablet   Commonly known as: PACERONE   Take 1 tablet (400 mg total) by mouth 2 (two) times daily.      clopidogrel 75 MG tablet  Commonly known as: PLAVIX   Take 75 mg by mouth daily.      colesevelam 625 MG tablet   Commonly known as: WELCHOL   Take 1,250 mg by mouth daily.      fish oil-omega-3 fatty acids 1000 MG capsule   Take 1 g by mouth daily.      furosemide 40 MG tablet   Commonly known as: LASIX   Take 20 mg by mouth daily.      isosorbide mononitrate 60 MG 24 hr tablet   Commonly known as: IMDUR   Take 60 mg by mouth daily.      latanoprost 0.005 % ophthalmic solution   Commonly known as: XALATAN   Place 1 drop into both eyes at bedtime.      magnesium oxide 400 MG tablet   Commonly known as: MAG-OX   Take 400 mg by mouth 3 (three) times daily.      metFORMIN 1000 MG tablet   Commonly known as: GLUCOPHAGE   Take 1 tablet (1,000 mg total) by mouth 2 (two) times daily with a meal. Please hold for 48hrs after heart procedure. May resume on 12/30/11      metoprolol 100 MG tablet   Commonly known as: LOPRESSOR   Take 1 tablet (100 mg total) by  mouth 2 (two) times daily.      nitroGLYCERIN 0.4 MG SL tablet   Commonly known as: NITROSTAT   Place 0.4 mg under the tongue every 5 (five) minutes as needed. As needed for chest pain      NOVOLOG MIX 70/30 FLEXPEN (70-30) 100 UNIT/ML injection   Generic drug: insulin aspart protamine-insulin aspart   Inject 20-34 Units into the skin Twice daily. 34 units in the morning & 20 units in the evening      potassium chloride 10 MEQ tablet   Commonly known as: K-DUR,KLOR-CON   Take 10 mEq by mouth daily.      VITAMIN B-12 IJ   Inject 1 application as directed every 30 (thirty) days. Given at the doctor's office at the end of each month      Vitamin D 1000 UNITS capsule   Take 1,000 Units by mouth daily. OTC      warfarin 5 MG tablet   Commonly known as: COUMADIN   Take 1 tablet (5 mg total) by mouth daily.            Disposition   Discharge Orders    Future Appointments: Provider: Department: Dept Phone: Center:   01/12/2012 12:15 PM [name], MD [facility] [phone] [facility]   01/12/2012 1:00 PM Sherrie George, MD [facility] [phone] None   01/17/2012 10:45 AM Rachael Fee, MD [facility] [phone] [facility]   01/19/2012 2:30 PM [facility] [phone] [facility]   01/19/2012 3:30 PM Chuck Hint, MD [facility] [phone] [facility]   02/10/2012 8:45 AM Sherrie George, MD [facility] [phone] None     Future Orders Please Complete By Expires   Diet - low sodium heart healthy      Increase activity slowly      Discharge instructions      Comments:   **PLEASE REMEMBER TO BRING ALL OF YOUR MEDICATIONS TO EACH OF YOUR FOLLOW-UP OFFICE VISITS.  * KEEP WRIST CATHETERIZATION SITE CLEAN AND DRY. Call the office for any signs of bleeding, pus, swelling, increased pain, or any other concerns. * NO HEAVY LIFTING (>10lbs) OR SEXUAL ACTIVITY X 7  DAYS. * NO DRIVING X 2-3 DAYS. * NO SOAKING BATHS, HOT TUBS, POOLS,  ETC., X 7 DAYS.  * Your coumadin dosing was changed to 5mg  daily. Amiodarone can affect your coumadin levels so you will need your INR (coumadin level) checked in the next 4-5 days to determine further dosing needs.   * Please hold metformin for 48hrs after your heart procedure. May resume on 12/30/11.  * Patients on amiodarone may require additional periodic monitoring of other organ systems, such as lungs, thyroid, eyes, and liver function. Please talk to your doctor at your follow-up appointments about what monitoring may be necessary for you.     Follow-up Information    Schedule an appointment as soon as possible for a visit with [name], MD. (Please have your INR checked on 7/5 or 7/8)    Contact information:   [address] [place] [address] [phone]       Follow up with [name], MD on 01/12/2012. (12:15)    Contact information:   [facility] [place] [address] [place] [address] [phone]          Outstanding Labs/Studies:  1. INR 2. Close monitoring of PFTs/LFTs/TFTs, etc. as she was initiated on amiodarone this admission.   Duration of Discharge Encounter: Greater than 30 minutes including physician and PA time.  Signed, HOPE, JESSICA PA-C 12/28/2011, 10:38 AM  Patient seen and examined.  Plan as discussed in my rounding note for today and outlined above. Fayrene Fearing Eular Panek  12/28/2011  1:00 PM

## 2011-12-28 NOTE — Progress Notes (Signed)
SUBJECTIVE:  No presyncope.  No chest pain.  No SOB   PHYSICAL EXAM Filed Vitals:   12/27/11 1229 12/27/11 1259 12/27/11 1400 12/27/11 2101  BP: 170/58 138/49 128/53 162/77  Pulse: 68 63 59 72  Temp:   97.3 F (36.3 C) 98.2 F (36.8 C)  TempSrc:   Oral Oral  Resp:   18 18  Height:      Weight:      SpO2:   96% 98%   General:  No distress Lungs:  Few dry crackles Heart:  RRR Abdomen:  Positive bowel sounds, no rebound no guarding Extremities:  No edema  LABS: Lab Results  Component Value Date   CKTOTAL 21 12/25/2011   CKMB 2.5 12/25/2011   TROPONINI <0.30 12/25/2011   Results for orders placed during the hospital encounter of 12/24/11 (from the past 24 hour(s))  GLUCOSE, CAPILLARY     Status: Abnormal   Collection Time   12/27/11  7:02 AM      Component Value Range   Glucose-Capillary 224 (*) 70 - 99 mg/dL  POCT ACTIVATED CLOTTING TIME     Status: Normal   Collection Time   12/27/11  8:25 AM      Component Value Range   Activated Clotting Time 114    GLUCOSE, CAPILLARY     Status: Abnormal   Collection Time   12/27/11  8:44 AM      Component Value Range   Glucose-Capillary 203 (*) 70 - 99 mg/dL  GLUCOSE, CAPILLARY     Status: Abnormal   Collection Time   12/27/11 11:33 AM      Component Value Range   Glucose-Capillary 239 (*) 70 - 99 mg/dL   Comment 1 Notify RN    GLUCOSE, CAPILLARY     Status: Abnormal   Collection Time   12/27/11  5:11 PM      Component Value Range   Glucose-Capillary 148 (*) 70 - 99 mg/dL   Comment 1 Notify RN    GLUCOSE, CAPILLARY     Status: Abnormal   Collection Time   12/27/11  8:25 PM      Component Value Range   Glucose-Capillary 68 (*) 70 - 99 mg/dL   Comment 1 Notify RN    GLUCOSE, CAPILLARY     Status: Normal   Collection Time   12/27/11  9:04 PM      Component Value Range   Glucose-Capillary 76  70 - 99 mg/dL   Comment 1 Notify RN    GLUCOSE, CAPILLARY     Status: Abnormal   Collection Time   12/27/11  9:47 PM      Component Value  Range   Glucose-Capillary 110 (*) 70 - 99 mg/dL   Comment 1 Notify RN      Intake/Output Summary (Last 24 hours) at 12/28/11 1610 Last data filed at 12/27/11 2101  Gross per 24 hour  Intake    583 ml  Output      0 ml  Net    583 ml    ASSESSMENT AND PLAN:  Syncope: The etiology of this is not clear.  Telemetry without arrhythmia.  Of note she was not orthostatic on this admission.  CAD:  Cath yesterday.  Patent stent and small vessel disease.  Medical management.    Atrial fibrillation:   I appreciate Dr. Graciela Husbands seeing the patient.  Now on amiodarone and warfarin.  Continue with current therapy.  She is maintaining NSR.  OK to discharge today.  Follow up with me in [place] two weeks.       Fayrene Fearing [facility] 12/28/2011 6:05 AM

## 2011-12-28 NOTE — Progress Notes (Signed)
UR Completed Jara Feider Graves-Bigelow, RN,BSN [phone]  

## 2011-12-28 NOTE — Care Management Note (Signed)
    Page 1 of 1   12/28/2011     10:20:44 AM   CARE MANAGEMENT NOTE 12/28/2011  Patient:  Alexandria [name], Alexandria [name]   Account Number:  [id number]  Date Initiated:  12/28/2011  Documentation initiated by:  GRAVES-BIGELOW,Anie Juniel  Subjective/Objective Assessment:   Pt admitted with syncope- s/p cath.     Action/Plan:   No needs from CM at this tme.   Anticipated DC Date:  12/28/2011   Anticipated DC Plan:  HOME/SELF CARE      DC Planning Services  CM consult      Choice offered to / List presented to:             Status of service:  Completed, signed off Medicare Important Message given?   (If response is "NO", the following Medicare IM given date fields will be blank) Date Medicare IM given:   Date Additional Medicare IM given:    Discharge Disposition:  HOME/SELF CARE  Per UR Regulation:  Reviewed for med. necessity/level of care/duration of stay  If discussed at Long Length of Stay Meetings, dates discussed:    Comments:

## 2011-12-28 NOTE — Progress Notes (Signed)
Pt's assessment unchanged from this am. Pt d/c'd to private vehicle via wheelchair. No further needs

## 2011-12-28 NOTE — Progress Notes (Addendum)
ANTICOAGULATION CONSULT NOTE   Pharmacy Consult for Warfarin Indication: Afib  Assessment: 78 yo female with chest pain, h/o AFib, and on chronic Warfarin therapy.  Admitting INR noted at 2.12.  Today after resuming her regimen yesterday her INR is 1.25 and she is without noted bleeding complications.  She was started on oral Amiodarone last evening at 400mg  twice daily.    Drug/Drug Interaction: Potentiation of warfarin-type (CYP2C9 and CYP3A4 substrate) anticoagulant response is almost always seen in patients receiving Amiodarone and can result in serious or fatal bleeding. Since the concomitant administration of warfarin with Amiodarone increases the prothrombin time by 100% after 3 to 4 days, the dose of the anticoagulant should be reduced by one-third to one-half, and prothrombin times should be monitored closely.  Home Dose:  Warfarin 10mg  on TTHS and 5mg  on MWFSu.    Goal of Therapy:  Heparin level 0.3-0.7 units/ml Monitor platelets by anticoagulation protocol: Yes   Plan:   Warfarin 5mg  today x1 if she is still here.  Please consider changing her discharge dosing of Warfarin to 5mg  daily (1/3rd reduction) with the addition of Amiodarone to her regimen and recheck PT/INR in ~4-5 days.  Monitor for bleeding complications and daily PT/INR as well as H/H and platelets.  Allergies  Allergen Reactions  . Ace Inhibitors Cough    Enalapril  benicor    . Crestor (Rosuvastatin Calcium)     Fatigue and leg pain   . Lipitor (Atorvastatin Calcium)     Myalgias   . Niaspan (Niacin)     Intol   . Olmesartan Cough  . Prednisone     REACTION: "retained fluid"   Patient Measurements: Height: 5\' 3"  (160 cm) Weight: 161 lb 8 oz (73.256 kg) IBW/kg (Calculated) : 52.4  Heparin Dosing Weight: 67 kg  Labs:  Basename 12/28/11 0527 12/27/11 0524 12/26/11 1800 12/26/11 0840 12/25/11 2100 12/25/11 1120  HGB 11.4* 11.3* -- -- -- --  HCT 35.3* 34.4* -- 37.1 -- --  PLT 191 203 -- 179 --  --  APTT -- -- -- -- -- --  LABPROT 16.0* 16.4* 18.2* -- -- --  INR 1.25 1.30 1.48 -- -- --  HEPARINUNFRC -- 0.47 -- 0.35 0.26* --  CREATININE 0.62 -- -- -- -- --  CKTOTAL -- -- -- -- -- 21  CKMB -- -- -- -- -- 2.5  TROPONINI -- -- -- -- -- <0.30    Estimated Creatinine Clearance: 54.7 ml/min (by C-G formula based on Cr of 0.62).  Nadara Mustard, PharmD., MS Clinical Pharmacist Pager:  [phone]  Thank you for allowing pharmacy to be part of this patients care team. 12/28/2011 9:10 AM

## 2012-01-12 ENCOUNTER — Ambulatory Visit (INDEPENDENT_AMBULATORY_CARE_PROVIDER_SITE_OTHER): Payer: Medicare Other | Admitting: Cardiology

## 2012-01-12 ENCOUNTER — Encounter: Payer: Self-pay | Admitting: Cardiology

## 2012-01-12 ENCOUNTER — Encounter (INDEPENDENT_AMBULATORY_CARE_PROVIDER_SITE_OTHER): Payer: Medicare Other | Admitting: Ophthalmology

## 2012-01-12 VITALS — BP 120/70 | HR 92 | Ht 62.0 in | Wt 168.0 lb

## 2012-01-12 DIAGNOSIS — R55 Syncope and collapse: Secondary | ICD-10-CM

## 2012-01-12 DIAGNOSIS — I4891 Unspecified atrial fibrillation: Secondary | ICD-10-CM

## 2012-01-12 DIAGNOSIS — I2510 Atherosclerotic heart disease of native coronary artery without angina pectoris: Secondary | ICD-10-CM

## 2012-01-12 DIAGNOSIS — I5033 Acute on chronic diastolic (congestive) heart failure: Secondary | ICD-10-CM

## 2012-01-12 MED ORDER — AMIODARONE HCL 400 MG PO TABS: 400.0000 mg | ORAL_TABLET | Freq: Every day | ORAL | Status: DC

## 2012-01-12 NOTE — Patient Instructions (Addendum)
Please decrease your Amiodarone to 400 mg a day, Continue all other medications as listed.  Please have bloodwork in 1 month prior to seeing Dr Antoine Poche.  (TSH and Hepatic panel, written RX given to pt)  Follow up with Dr Antoine Poche in 1 month.

## 2012-01-12 NOTE — Progress Notes (Addendum)
HPI The patient presents for followup of a recent hospitalization with syncope. The etiology of this was not clear. Cardiac catheterization demonstrated nonobstructive disease as reported below. She did have QT prolongation on EKG and was taken off of Tikosyn and treated with amiodarone for atrial fibrillation. She was maintaining sinus rhythm however.  Since she was discharged she's had no further syncopal episodes. However, she has felt somewhat dizzy. He feels "swimmy headed". She's not had any chest pressure, neck or arm discomfort. She denies any new shortness of breath though she gets short of breath at night. She's had no new PND or orthopnea however.  Allergies  Allergen Reactions  . Ace Inhibitors Cough    Enalapril  benicor    . Crestor (Rosuvastatin Calcium)     Fatigue and leg pain   . Lipitor (Atorvastatin Calcium)     Myalgias   . Niaspan (Niacin)     Intol   . Olmesartan Cough  . Prednisone     REACTION: "retained fluid"    Current Outpatient Prescriptions  Medication Sig Dispense Refill  . amiodarone (PACERONE) 400 MG tablet Take 1 tablet (400 mg total) by mouth daily.  60 tablet  1  . Cholecalciferol (VITAMIN D) 1000 UNITS capsule Take 1,000 Units by mouth daily. OTC      . clopidogrel (PLAVIX) 75 MG tablet Take 75 mg by mouth daily.      . colesevelam (WELCHOL) 625 MG tablet Take 1,250 mg by mouth daily.       . Cyanocobalamin (VITAMIN B-12 IJ) Inject 1 application as directed every 30 (thirty) days. Given at the doctor's office at the end of each month      . fish oil-omega-3 fatty acids 1000 MG capsule Take 1 g by mouth daily.      . furosemide (LASIX) 40 MG tablet Take 20 mg by mouth daily.      . isosorbide mononitrate (IMDUR) 60 MG 24 hr tablet Take 60 mg by mouth daily.      Marland Kitchen latanoprost (XALATAN) 0.005 % ophthalmic solution Place 1 drop into both eyes at bedtime.       . magnesium oxide (MAG-OX) 400 MG tablet Take 400 mg by mouth 3 (three) times  daily.      . metFORMIN (GLUCOPHAGE) 1000 MG tablet Take 1 tablet (1,000 mg total) by mouth 2 (two) times daily with a meal. Please hold for 48hrs after heart procedure. May resume on 12/30/11      . metoprolol (LOPRESSOR) 100 MG tablet Take 1 tablet (100 mg total) by mouth 2 (two) times daily.  30 tablet  6  . nitroGLYCERIN (NITROSTAT) 0.4 MG SL tablet Place 0.4 mg under the tongue every 5 (five) minutes as needed. As needed for chest pain      . NOVOLOG MIX 70/30 FLEXPEN (70-30) 100 UNIT/ML injection Inject 20-34 Units into the skin Twice daily. 34 units in the morning & 20 units in the evening      . potassium chloride (K-DUR,KLOR-CON) 10 MEQ tablet Take 10 mEq by mouth daily.      Marland Kitchen warfarin (COUMADIN) 5 MG tablet Take 1 tablet (5 mg total) by mouth daily.  30 tablet  6  . DISCONTD: amiodarone (PACERONE) 400 MG tablet Take 1 tablet (400 mg total) by mouth 2 (two) times daily.  60 tablet  1  . DISCONTD: diltiazem (TIAZAC) 420 MG 24 hr capsule Take 420 mg by mouth daily.  Past Medical History  Diagnosis Date  . Hypertension     with h/o hypertensive urgency  . Glaucoma   . Tremor, essential   . Diabetes mellitus with neuropathy   . B12 deficiency   . Hyperlipidemia   . Atrial fibrillation     a. on chronic anticoagulation  b. Tiksosyn discontinued and amiodarone initiated 11/2011,  . Chronic diastolic heart failure     echo 05/2009: mild LVH, EF 55-60%, grade 2 diast dysfxn, mild LAE, PASP  . CAD (coronary artery disease)     a.  LHC 12/27/11: mLAD 30%, prox-mid D1 90% (small);  CFX patent stent, OM3 small 95%,, mRCA 30%, EF 65%  -  med Rx; b. 07/2011 NSTEMI - PCI/DES LCX - Resolute stent;  . Carotid stenosis     dopplers 6/12: 1-39% bilat ICA  . Pulmonary fibrosis     Dr. Vassie Loll;  patient taken off O2 in 8/12; dyspnea felt CHF>>ILD  . Skin cancer   . Warfarin anticoagulation   . Hypomagnesemia   . Colon polyps   . Syncope     unknown etiology    Past Surgical History    Procedure Date  . Appendectomy   . Nose surgery   . Cardiac catheterization 2013    stent  . Colonoscopy    ROS:  As stated in the HPI and negative for all other systems.  PHYSICAL EXAM BP 120/70  Pulse 92  Ht 5\' 2"  (1.575 m)  Wt 168 lb (76.204 kg)  BMI 30.73 kg/m2 GENERAL:  Well appearing HEENT:  Pupils equal round and reactive, fundi not visualized, oral mucosa unremarkable NECK:  No jugular venous distention, waveform within normal limits, carotid upstroke brisk and symmetric, left bruit, no thyromegaly LYMPHATICS:  No cervical, inguinal adenopathy LUNGS:  Clear to auscultation bilaterally BACK:  No CVA tenderness CHEST:  Unremarkable HEART:  PMI not displaced or sustained,S1 and S2 within normal limits, no S3, no S4, no clicks, no rubs, no murmurs ABD:  Flat, positive bowel sounds normal in frequency in pitch, mid bruits, no rebound, no guarding, no midline pulsatile mass, no hepatomegaly, no splenomegaly EXT:  2 plus pulses throughout, no edema, no cyanosis no clubbing SKIN:  No rashes no nodules NEURO:  Cranial nerves II through XII grossly intact, motor grossly intact throughout PSYCH:  Cognitively intact, oriented to person place and time   ASSESSMENT AND PLAN   FIBRILLATION, ATRIAL -  The patient is doing well on Xarelto. She is now on amiodarone. With her dizziness I will reduce this to 400 mg once daily. I will see her in one month and reduce this to the maintenance of 200 mg daily. I will see her sooner if she has any further problems. Prior to the next visit I will order a TSH and liver profile.   HYPERTENSION -  The blood pressure is at target. No change in medications is indicated.   ISCHEMIC CARDIOMYOPATHY -  She is euvolemic. She will continue with meds as listed with the change listed above.  SYNCOPE -  The etiology of this is still not clear. No further workup however is planned.  CAD -  The results of her cath have been updated in her history above.  She had nonobstructive disease. She will continue with risk reduction.

## 2012-01-14 ENCOUNTER — Encounter (INDEPENDENT_AMBULATORY_CARE_PROVIDER_SITE_OTHER): Payer: Medicare Other | Admitting: Ophthalmology

## 2012-01-14 DIAGNOSIS — E11359 Type 2 diabetes mellitus with proliferative diabetic retinopathy without macular edema: Secondary | ICD-10-CM

## 2012-01-14 DIAGNOSIS — E11311 Type 2 diabetes mellitus with unspecified diabetic retinopathy with macular edema: Secondary | ICD-10-CM

## 2012-01-14 DIAGNOSIS — E1165 Type 2 diabetes mellitus with hyperglycemia: Secondary | ICD-10-CM

## 2012-01-14 DIAGNOSIS — H2700 Aphakia, unspecified eye: Secondary | ICD-10-CM

## 2012-01-14 DIAGNOSIS — H43819 Vitreous degeneration, unspecified eye: Secondary | ICD-10-CM

## 2012-01-17 ENCOUNTER — Ambulatory Visit (INDEPENDENT_AMBULATORY_CARE_PROVIDER_SITE_OTHER): Payer: Medicare Other | Admitting: Gastroenterology

## 2012-01-17 ENCOUNTER — Other Ambulatory Visit (INDEPENDENT_AMBULATORY_CARE_PROVIDER_SITE_OTHER): Payer: Medicare Other

## 2012-01-17 ENCOUNTER — Encounter: Payer: Self-pay | Admitting: Gastroenterology

## 2012-01-17 VITALS — BP 104/54 | HR 94 | Ht 62.0 in | Wt 163.2 lb

## 2012-01-17 DIAGNOSIS — R195 Other fecal abnormalities: Secondary | ICD-10-CM

## 2012-01-17 DIAGNOSIS — Z86010 Personal history of colonic polyps: Secondary | ICD-10-CM

## 2012-01-17 LAB — CBC WITH DIFFERENTIAL/PLATELET
Basophils Absolute: 0.2 10*3/uL — ABNORMAL HIGH (ref 0.0–0.1)
HCT: 39.1 % (ref 36.0–46.0)
Lymphs Abs: 1.6 10*3/uL (ref 0.7–4.0)
MCHC: 32.8 g/dL (ref 30.0–36.0)
MCV: 82 fl (ref 78.0–100.0)
Monocytes Absolute: 0.8 10*3/uL (ref 0.1–1.0)
Platelets: 225 10*3/uL (ref 150.0–400.0)
RDW: 14.7 % — ABNORMAL HIGH (ref 11.5–14.6)

## 2012-01-17 MED ORDER — MOVIPREP 100 G PO SOLR: 1.0000 | ORAL | Status: DC

## 2012-01-17 NOTE — Patient Instructions (Addendum)
You will be set up for a colonoscopy at [facility] (moderate sedation) for history of polyps, heme positive stool. Should hold coumadin for 5 days prior (Dr. Antoine Poche already ok'd this). Stay on your plavix.

## 2012-01-17 NOTE — Progress Notes (Signed)
Review of pertinent gastrointestinal problems: 1. Adenomatous polyps 05/2007: two polyps, one was pedunculated 1.8cm, completely removed but lost in diverticular left colon.  Recommended repeat exam at 3 year interval.    HPI: This is a    very pleasant 78 year old woman whom I last saw one month ago.   She is on Coumadin for atrial fibrillation. She is on Plavix for coronary artery disease with a drug-eluting stent placed 5 months ago.  Labs this AM show normal Hb.    I met with her last month to discuss colonoscopy for Hemoccult-positive stool, 3 out of 3 samples were positive as well as history of colon polyps. Her cardiologist does not want her to come off Plavix for another several months but said Coumadin can be held if needed.    Past Medical History  Diagnosis Date  . Hypertension     with h/o hypertensive urgency  . Glaucoma   . Tremor, essential   . Diabetes mellitus with neuropathy   . B12 deficiency   . Hyperlipidemia   . Atrial fibrillation     a. on chronic anticoagulation  b. Tiksosyn discontinued and amiodarone initiated 11/2011,  . Chronic diastolic heart failure     echo 05/2009: mild LVH, EF 55-60%, grade 2 diast dysfxn, mild LAE, PASP  . CAD (coronary artery disease)     a.  LHC 12/27/11: mLAD 30%, prox-mid D1 90% (small);  CFX patent stent, OM3 small 95%,, mRCA 30%, EF 65%  -  med Rx; b. 07/2011 NSTEMI - PCI/DES LCX - Resolute stent;  . Carotid stenosis     dopplers 6/12: 1-39% bilat ICA  . Pulmonary fibrosis     Dr. Vassie Loll;  patient taken off O2 in 8/12; dyspnea felt CHF>>ILD  . Skin cancer   . Warfarin anticoagulation   . Hypomagnesemia   . Colon polyps   . Syncope     unknown etiology    Past Surgical History  Procedure Date  . Appendectomy   . Nose surgery   . Cardiac catheterization 2013    stent  . Colonoscopy     Current Outpatient Prescriptions  Medication Sig Dispense Refill  . amiodarone (PACERONE) 400 MG tablet Take 1 tablet (400 mg  total) by mouth daily.  60 tablet  1  . Cholecalciferol (VITAMIN D) 1000 UNITS capsule Take 1,000 Units by mouth daily. OTC      . clopidogrel (PLAVIX) 75 MG tablet Take 75 mg by mouth daily.      . colesevelam (WELCHOL) 625 MG tablet Take 1,250 mg by mouth daily.       . Cyanocobalamin (VITAMIN B-12 IJ) Inject 1 application as directed every 30 (thirty) days. Given at the doctor's office at the end of each month      . fish oil-omega-3 fatty acids 1000 MG capsule Take 1 g by mouth daily.      . furosemide (LASIX) 40 MG tablet Take 20 mg by mouth daily.      . isosorbide mononitrate (IMDUR) 60 MG 24 hr tablet Take 60 mg by mouth daily.      Marland Kitchen latanoprost (XALATAN) 0.005 % ophthalmic solution Place 1 drop into both eyes at bedtime.       . magnesium oxide (MAG-OX) 400 MG tablet Take 400 mg by mouth 3 (three) times daily.      . metFORMIN (GLUCOPHAGE) 1000 MG tablet Take 1 tablet (1,000 mg total) by mouth 2 (two) times daily with a meal. Please hold for  48hrs after heart procedure. May resume on 12/30/11      . metoprolol (LOPRESSOR) 100 MG tablet Take 1 tablet (100 mg total) by mouth 2 (two) times daily.  30 tablet  6  . nitroGLYCERIN (NITROSTAT) 0.4 MG SL tablet Place 0.4 mg under the tongue every 5 (five) minutes as needed. As needed for chest pain      . NOVOLOG MIX 70/30 FLEXPEN (70-30) 100 UNIT/ML injection Inject 20-34 Units into the skin Twice daily. 34 units in the morning & 20 units in the evening      . potassium chloride (K-DUR,KLOR-CON) 10 MEQ tablet Take 10 mEq by mouth daily.      Marland Kitchen warfarin (COUMADIN) 5 MG tablet Take 1 tablet (5 mg total) by mouth daily.  30 tablet  6  . DISCONTD: diltiazem (TIAZAC) 420 MG 24 hr capsule Take 420 mg by mouth daily.        Allergies as of 01/17/2012 - Review Complete 01/17/2012  Allergen Reaction Noted  . Ace inhibitors Cough 11/09/2010  . Crestor (rosuvastatin calcium)  11/09/2010  . Lipitor (atorvastatin calcium)  11/09/2010  . Niaspan (niacin)   11/09/2010  . Olmesartan Cough 05/23/2011  . Prednisone      Family History  Problem Relation Age of Onset  . Throat cancer Brother   . Prostate cancer Father   . Diabetes Mother   . Diabetes Brother     multiple  . Heart disease Brother   . Colon cancer Neg Hx     History   Social History  . Marital Status: Widowed    Spouse Name: N/A    Number of Children: 1  . Years of Education: N/A   Occupational History  . retired    Social History Main Topics  . Smoking status: Never Smoker   . Smokeless tobacco: Never Used  . Alcohol Use: No  . Drug Use: No  . Sexually Active: No   Other Topics Concern  . Not on file   Social History Narrative  . No narrative on file      Physical Exam: BP 104/54  Pulse 94  Ht 5\' 2"  (1.575 m)  Wt 163 lb 4 oz (74.05 kg)  BMI 29.86 kg/m2 Constitutional: generally well-appearing Psychiatric: alert and oriented x3 Abdomen: soft, nontender, nondistended, no obvious ascites, no peritoneal signs, normal bowel sounds     Assessment and plan: 78 y.o. female with Hemoccult-positive stool, personal history of adenomatous polyps, on Coumadin, on Plavix  We will proceed with colonoscopy at her soonest convenience. We will hold her Coumadin for 5 days, this has already been okayed by her cardiologist. She will continue on Plavix however.   Cardiac disease I would like to proceed with this at the hospital, especially given the fact that she will be on continuous blood thinner throughout

## 2012-01-18 ENCOUNTER — Encounter: Payer: Self-pay | Admitting: Neurosurgery

## 2012-01-19 ENCOUNTER — Ambulatory Visit (INDEPENDENT_AMBULATORY_CARE_PROVIDER_SITE_OTHER): Payer: Medicare Other | Admitting: Neurosurgery

## 2012-01-19 ENCOUNTER — Encounter: Payer: Self-pay | Admitting: Neurosurgery

## 2012-01-19 ENCOUNTER — Ambulatory Visit (INDEPENDENT_AMBULATORY_CARE_PROVIDER_SITE_OTHER): Payer: Medicare Other | Admitting: *Deleted

## 2012-01-19 VITALS — BP 192/82 | HR 53 | Resp 20 | Ht 62.0 in | Wt 167.5 lb

## 2012-01-19 DIAGNOSIS — I6529 Occlusion and stenosis of unspecified carotid artery: Secondary | ICD-10-CM

## 2012-01-19 NOTE — Progress Notes (Signed)
[name] Office Note  CC: Annual [name] duplex for surveillance Referring Physician: Edilia Bo  History of Present Illness: 78 year old female patient of Dr. Adele Dan with no [name] intervention history. The patient follows up for known [name] disease and yearly duplex. The patient denies any signs or symptoms of CVA, TIA, amaurosis fugax or any neural deficit. The patient states she's had "several" hospitalizations in the past year.  Past Medical History  Diagnosis Date  . Hypertension     with h/o hypertensive urgency  . Glaucoma   . Tremor, essential   . Diabetes mellitus with neuropathy   . B12 deficiency   . Hyperlipidemia   . Atrial fibrillation     a. on chronic anticoagulation  b. Tiksosyn discontinued and amiodarone initiated 11/2011,  . Chronic diastolic heart failure     echo 05/2009: mild LVH, EF 55-60%, grade 2 diast dysfxn, mild LAE, PASP  . CAD (coronary artery disease)     a.  LHC 12/27/11: mLAD 30%, prox-mid D1 90% (small);  CFX patent stent, OM3 small 95%,, mRCA 30%, EF 65%  -  med Rx; b. 07/2011 NSTEMI - PCI/DES LCX - Resolute stent;  . [name] stenosis     dopplers 6/12: 1-39% bilat ICA  . Pulmonary fibrosis     Dr. Vassie Loll;  patient taken off O2 in 8/12; dyspnea felt CHF>>ILD  . Skin cancer   . Warfarin anticoagulation   . Hypomagnesemia   . Colon polyps   . Syncope     unknown etiology  . Diabetes mellitus     ROS: [x]  Positive   [ ]  Denies    General: [ ]  Weight loss, [ ]  Fever, [ ]  chills Neurologic: [ ]  Dizziness, [ ]  Blackouts, [ ]  Seizure [ ]  Stroke, [ ]  "Mini stroke", [ ]  Slurred speech, [ ]  Temporary blindness; [ ]  weakness in arms or legs, [ ]  Hoarseness Cardiac: [ ]  Chest pain/pressure, [ ]  Shortness of breath at rest [ ]  Shortness of breath with exertion, [ ]  Atrial fibrillation or irregular heartbeat Vascular: [ ]  Pain in legs with walking, [ ]  Pain in legs at rest, [ ]  Pain in legs at night,  [ ]   Non-healing ulcer, [ ]  Blood clot in vein/DVT,   Pulmonary: [ ]  Home oxygen, [ ]  Productive cough, [ ]  Coughing up blood, [ ]  Asthma,  [ ]  Wheezing Musculoskeletal:  [ ]  Arthritis, [ ]  Low back pain, [ ]  Joint pain Hematologic: [ ]  Easy Bruising, [ ]  Anemia; [ ]  Hepatitis Gastrointestinal: [ ]  Blood in stool, [ ]  Gastroesophageal Reflux/heartburn, [ ]  Trouble swallowing Urinary: [ ]  chronic Kidney disease, [ ]  on HD - [ ]  MWF or [ ]  TTHS, [ ]  Burning with urination, [ ]  Difficulty urinating Skin: [ ]  Rashes, [ ]  Wounds Psychological: [ ]  Anxiety, [ ]  Depression   Social History History  Substance Use Topics  . Smoking status: Never Smoker   . Smokeless tobacco: Never Used  . Alcohol Use: No    Family History Family History  Problem Relation Age of Onset  . Throat cancer Brother   . Prostate cancer Father   . Diabetes Mother   . Diabetes Brother     multiple  . Heart disease Brother   . Colon cancer Neg Hx     Allergies  Allergen Reactions  . Ace Inhibitors Cough    Enalapril  benicor    . Crestor (Rosuvastatin Calcium)  Fatigue and leg pain   . Lipitor (Atorvastatin Calcium)     Myalgias   . Niaspan (Niacin)     Intol   . Olmesartan Cough  . Prednisone     REACTION: "retained fluid"    Current Outpatient Prescriptions  Medication Sig Dispense Refill  . amiodarone (PACERONE) 400 MG tablet Take 1 tablet (400 mg total) by mouth daily.  60 tablet  1  . Cholecalciferol (VITAMIN D) 1000 UNITS capsule Take 1,000 Units by mouth daily. OTC      . clopidogrel (PLAVIX) 75 MG tablet Take 75 mg by mouth daily.      . colesevelam (WELCHOL) 625 MG tablet Take 1,250 mg by mouth daily.       . Cyanocobalamin (VITAMIN B-12 IJ) Inject 1 application as directed every 30 (thirty) days. Given at the doctor's office at the end of each month      . fish oil-omega-3 fatty acids 1000 MG capsule Take 1 g by mouth daily.      . furosemide (LASIX) 40 MG tablet Take 20 mg by mouth  daily.      . isosorbide mononitrate (IMDUR) 60 MG 24 hr tablet Take 60 mg by mouth daily.      Marland Kitchen latanoprost (XALATAN) 0.005 % ophthalmic solution Place 1 drop into both eyes at bedtime.       . magnesium oxide (MAG-OX) 400 MG tablet Take 400 mg by mouth 3 (three) times daily.      . metFORMIN (GLUCOPHAGE) 1000 MG tablet Take 1 tablet (1,000 mg total) by mouth 2 (two) times daily with a meal. Please hold for 48hrs after heart procedure. May resume on 12/30/11      . metoprolol (LOPRESSOR) 100 MG tablet Take 1 tablet (100 mg total) by mouth 2 (two) times daily.  30 tablet  6  . nitroGLYCERIN (NITROSTAT) 0.4 MG SL tablet Place 0.4 mg under the tongue every 5 (five) minutes as needed. As needed for chest pain      . NOVOLOG MIX 70/30 FLEXPEN (70-30) 100 UNIT/ML injection Inject 20-34 Units into the skin Twice daily. 34 units in the morning & 20 units in the evening      . potassium chloride (K-DUR,KLOR-CON) 10 MEQ tablet Take 10 mEq by mouth daily.      Marland Kitchen warfarin (COUMADIN) 5 MG tablet Take 1 tablet (5 mg total) by mouth daily.  30 tablet  6  . MOVIPREP 100 G SOLR Take 1 kit (100 g total) by mouth as directed. Name brand only  1 kit  0  . DISCONTD: diltiazem (TIAZAC) 420 MG 24 hr capsule Take 420 mg by mouth daily.        Physical Examination  Filed Vitals:   01/19/12 1520  BP: 192/82  Pulse: 53  Resp:     Body mass index is 30.64 kg/(m^2).  General:  WDWN in NAD Gait: Normal HEENT: WNL Eyes: Pupils equal Pulmonary: normal non-labored breathing , without Rales, rhonchi,  wheezing Cardiac: RRR, without  Murmurs, rubs or gallops; Abdomen: soft, NT, no masses Skin: no rashes, ulcers noted  Vascular Exam Pulses: 2+ radial pulses bilaterally [name] bruits: [name] pulses to auscultation no bruits are heard Extremities without ischemic changes, no Gangrene , no cellulitis; no open wounds;  Musculoskeletal: no muscle wasting or atrophy   Neurologic: A&O X 3; Appropriate Affect ;  SENSATION: normal; MOTOR FUNCTION:  moving all extremities equally. Speech is fluent/normal  Non-Invasive Vascular Imaging [name] DUPLEX 01/19/2012  Right  ICA 20 - 39 % stenosis Left ICA 20 - 39 % stenosis   ASSESSMENT/PLAN: Asymptomatic patient with known mild/moderate [name] disease bilaterally, the patient will followup in one year for repeat [name] duplex. The patient's questions were encouraged and answered, she is in agreement with this plan. The patient knows the signs and symptoms of CVA and knows to report to the nearest emergency department should this occur.  Lauree Chandler ANP   Clinic MD: Edilia Bo

## 2012-01-21 ENCOUNTER — Encounter (HOSPITAL_COMMUNITY): Payer: Self-pay | Admitting: Cardiology

## 2012-01-21 ENCOUNTER — Other Ambulatory Visit: Payer: Self-pay

## 2012-01-21 ENCOUNTER — Emergency Department (HOSPITAL_COMMUNITY): Payer: Medicare Other

## 2012-01-21 ENCOUNTER — Observation Stay (HOSPITAL_COMMUNITY)
Admission: EM | Admit: 2012-01-21 | Discharge: 2012-01-23 | Disposition: A | Discharge status: Home / Self Care | Payer: Medicare Other | Attending: Cardiology | Admitting: Cardiology

## 2012-01-21 DIAGNOSIS — I5033 Acute on chronic diastolic (congestive) heart failure: Secondary | ICD-10-CM

## 2012-01-21 DIAGNOSIS — E785 Hyperlipidemia, unspecified: Secondary | ICD-10-CM | POA: Diagnosis present

## 2012-01-21 DIAGNOSIS — E1142 Type 2 diabetes mellitus with diabetic polyneuropathy: Secondary | ICD-10-CM | POA: Insufficient documentation

## 2012-01-21 DIAGNOSIS — I5032 Chronic diastolic (congestive) heart failure: Secondary | ICD-10-CM

## 2012-01-21 DIAGNOSIS — E119 Type 2 diabetes mellitus without complications: Secondary | ICD-10-CM

## 2012-01-21 DIAGNOSIS — I10 Essential (primary) hypertension: Secondary | ICD-10-CM | POA: Diagnosis present

## 2012-01-21 DIAGNOSIS — R079 Chest pain, unspecified: Principal | ICD-10-CM

## 2012-01-21 DIAGNOSIS — I4891 Unspecified atrial fibrillation: Secondary | ICD-10-CM | POA: Diagnosis present

## 2012-01-21 DIAGNOSIS — E1149 Type 2 diabetes mellitus with other diabetic neurological complication: Secondary | ICD-10-CM | POA: Insufficient documentation

## 2012-01-21 DIAGNOSIS — I2510 Atherosclerotic heart disease of native coronary artery without angina pectoris: Secondary | ICD-10-CM | POA: Diagnosis present

## 2012-01-21 DIAGNOSIS — I200 Unstable angina: Secondary | ICD-10-CM

## 2012-01-21 DIAGNOSIS — Z7902 Long term (current) use of antithrombotics/antiplatelets: Secondary | ICD-10-CM | POA: Insufficient documentation

## 2012-01-21 DIAGNOSIS — Z7901 Long term (current) use of anticoagulants: Secondary | ICD-10-CM

## 2012-01-21 DIAGNOSIS — I509 Heart failure, unspecified: Secondary | ICD-10-CM | POA: Insufficient documentation

## 2012-01-21 LAB — CBC
MCHC: 32.2 g/dL (ref 30.0–36.0)
MCV: 80.7 fL (ref 78.0–100.0)
Platelets: 181 10*3/uL (ref 150–400)
Platelets: 193 10*3/uL (ref 150–400)
RBC: 4.24 MIL/uL (ref 3.87–5.11)
RDW: 14.3 % (ref 11.5–15.5)
WBC: 9.9 10*3/uL (ref 4.0–10.5)
WBC: 8.2 10*3/uL (ref 4.0–10.5)

## 2012-01-21 LAB — COMPREHENSIVE METABOLIC PANEL
AST: 14 U/L (ref 0–37)
Albumin: 3.6 g/dL (ref 3.5–5.2)
Alkaline Phosphatase: 77 U/L (ref 39–117)
Chloride: 95 mEq/L — ABNORMAL LOW (ref 96–112)
Creatinine, Ser: 0.62 mg/dL (ref 0.50–1.10)
Potassium: 3.8 mEq/L (ref 3.5–5.1)
Total Bilirubin: 0.6 mg/dL (ref 0.3–1.2)
Total Protein: 7.4 g/dL (ref 6.0–8.3)

## 2012-01-21 LAB — CARDIAC PANEL(CRET KIN+CKTOT+MB+TROPI)
Relative Index: INVALID (ref 0.0–2.5)
Troponin I: <0.3 ng/mL (ref <0.30)

## 2012-01-21 LAB — CREATININE, SERUM
Creatinine, Ser: 0.71 mg/dL (ref 0.50–1.10)
GFR calc Af Amer: >90 mL/min (ref >90)

## 2012-01-21 LAB — PROTIME-INR: INR: 1.86 — ABNORMAL HIGH (ref 0.00–1.49)

## 2012-01-21 MED ORDER — CLOPIDOGREL BISULFATE 75 MG PO TABS
75.0000 mg | ORAL_TABLET | Freq: Every day | ORAL | Status: DC
  Administered 2012-01-22 – 2012-01-23 (×2): 75 mg via ORAL
  Filled 2012-01-21 (×2): qty 1

## 2012-01-21 MED ORDER — ISOSORBIDE MONONITRATE ER 60 MG PO TB24
60.0000 mg | ORAL_TABLET | Freq: Every day | ORAL | Status: DC
  Administered 2012-01-21 – 2012-01-23 (×3): 60 mg via ORAL
  Filled 2012-01-21 (×3): qty 1

## 2012-01-21 MED ORDER — SODIUM CHLORIDE 0.9 % IJ SOLN: 3.0000 mL | INTRAMUSCULAR | Status: DC | PRN

## 2012-01-21 MED ORDER — WARFARIN SODIUM 5 MG PO TABS: 5.0000 mg | ORAL_TABLET | Freq: Every day | ORAL | Status: DC

## 2012-01-21 MED ORDER — OMEGA-3 FATTY ACIDS 1000 MG PO CAPS: 1.0000 g | ORAL_CAPSULE | Freq: Every day | ORAL | Status: DC

## 2012-01-21 MED ORDER — WARFARIN SODIUM 10 MG PO TABS
10.0000 mg | ORAL_TABLET | Freq: Once | ORAL | Status: DC
  Filled 2012-01-21: qty 1

## 2012-01-21 MED ORDER — WARFARIN - PHARMACIST DOSING INPATIENT: Freq: Every day | Status: DC

## 2012-01-21 MED ORDER — LATANOPROST 0.005 % OP SOLN
1.0000 [drp] | Freq: Every day | OPHTHALMIC | Status: DC
  Administered 2012-01-22 (×2): 1 [drp] via OPHTHALMIC
  Filled 2012-01-21 (×2): qty 2.5

## 2012-01-21 MED ORDER — ENOXAPARIN SODIUM 40 MG/0.4ML ~~LOC~~ SOLN: 40.0000 mg | SUBCUTANEOUS | Status: DC

## 2012-01-21 MED ORDER — ALPRAZOLAM 0.25 MG PO TABS: 0.2500 mg | ORAL_TABLET | Freq: Two times a day (BID) | ORAL | Status: DC | PRN

## 2012-01-21 MED ORDER — ACETAMINOPHEN 325 MG PO TABS: 650.0000 mg | ORAL_TABLET | ORAL | Status: DC | PRN

## 2012-01-21 MED ORDER — MORPHINE SULFATE 2 MG/ML IJ SOLN
2.0000 mg | Freq: Once | INTRAMUSCULAR | Status: AC
  Administered 2012-01-21: 2 mg via INTRAVENOUS
  Filled 2012-01-21: qty 1

## 2012-01-21 MED ORDER — ZOLPIDEM TARTRATE 5 MG PO TABS: 5.0000 mg | ORAL_TABLET | Freq: Every evening | ORAL | Status: DC | PRN

## 2012-01-21 MED ORDER — SODIUM CHLORIDE 0.9 % IV SOLN: 250.0000 mL | INTRAVENOUS | Status: DC | PRN

## 2012-01-21 MED ORDER — NITROGLYCERIN 0.4 MG SL SUBL: 0.4000 mg | SUBLINGUAL_TABLET | SUBLINGUAL | Status: DC | PRN

## 2012-01-21 MED ORDER — VITAMIN D 1000 UNITS PO CAPS: 1000.0000 [IU] | ORAL_CAPSULE | Freq: Every day | ORAL | Status: DC

## 2012-01-21 MED ORDER — OMEGA-3-ACID ETHYL ESTERS 1 G PO CAPS
1.0000 g | ORAL_CAPSULE | Freq: Every day | ORAL | Status: DC
  Administered 2012-01-22 – 2012-01-23 (×3): 1 g via ORAL
  Filled 2012-01-21 (×5): qty 1

## 2012-01-21 MED ORDER — AMIODARONE HCL 200 MG PO TABS
400.0000 mg | ORAL_TABLET | Freq: Every day | ORAL | Status: DC
  Administered 2012-01-22 – 2012-01-23 (×3): 400 mg via ORAL
  Filled 2012-01-21 (×4): qty 2

## 2012-01-21 MED ORDER — SODIUM CHLORIDE 0.9 % IJ SOLN
3.0000 mL | Freq: Two times a day (BID) | INTRAMUSCULAR | Status: DC
  Administered 2012-01-22 (×3): 3 mL via INTRAVENOUS

## 2012-01-21 MED ORDER — INSULIN ASPART PROT & ASPART (70-30 MIX) 100 UNIT/ML ~~LOC~~ SUSP
22.0000 [IU] | Freq: Every day | SUBCUTANEOUS | Status: DC
  Administered 2012-01-22 – 2012-01-23 (×2): 22 [IU] via SUBCUTANEOUS
  Filled 2012-01-21 (×3): qty 3

## 2012-01-21 MED ORDER — MAGNESIUM OXIDE 400 MG PO TABS
400.0000 mg | ORAL_TABLET | Freq: Three times a day (TID) | ORAL | Status: DC
  Administered 2012-01-22 – 2012-01-23 (×5): 400 mg via ORAL
  Filled 2012-01-21 (×9): qty 1

## 2012-01-21 MED ORDER — COLESEVELAM HCL 625 MG PO TABS
650.0000 mg | ORAL_TABLET | Freq: Every day | ORAL | Status: DC
  Administered 2012-01-22 – 2012-01-23 (×3): 625 mg via ORAL
  Filled 2012-01-21 (×4): qty 1

## 2012-01-21 MED ORDER — HYDRALAZINE HCL 20 MG/ML IJ SOLN
10.0000 mg | INTRAMUSCULAR | Status: DC | PRN
  Administered 2012-01-22: 10 mg via INTRAVENOUS
  Filled 2012-01-21: qty 1

## 2012-01-21 MED ORDER — METOPROLOL TARTRATE 100 MG PO TABS
100.0000 mg | ORAL_TABLET | Freq: Two times a day (BID) | ORAL | Status: DC
Start: 2012-01-21 — End: 2012-01-23
  Administered 2012-01-21 – 2012-01-23 (×5): 100 mg via ORAL
  Filled 2012-01-21 (×3): qty 1
  Filled 2012-01-21: qty 4
  Filled 2012-01-21 (×2): qty 1

## 2012-01-21 MED ORDER — ONDANSETRON HCL 4 MG/2ML IJ SOLN
4.0000 mg | Freq: Four times a day (QID) | INTRAMUSCULAR | Status: DC | PRN
  Administered 2012-01-22: 4 mg via INTRAVENOUS
  Filled 2012-01-21: qty 2

## 2012-01-21 MED ORDER — FUROSEMIDE 20 MG PO TABS
20.0000 mg | ORAL_TABLET | Freq: Every day | ORAL | Status: DC
  Administered 2012-01-22 – 2012-01-23 (×2): 20 mg via ORAL
  Filled 2012-01-21 (×2): qty 1

## 2012-01-21 MED ORDER — FUROSEMIDE 10 MG/ML IJ SOLN: 40.0000 mg | Freq: Once | INTRAMUSCULAR | Status: DC

## 2012-01-21 MED ORDER — VITAMIN D3 25 MCG (1000 UNIT) PO TABS
1000.0000 [IU] | ORAL_TABLET | Freq: Every day | ORAL | Status: DC
  Administered 2012-01-22 – 2012-01-23 (×3): 1000 [IU] via ORAL
  Filled 2012-01-21 (×4): qty 1

## 2012-01-21 MED ORDER — POTASSIUM CHLORIDE CRYS ER 10 MEQ PO TBCR
10.0000 meq | EXTENDED_RELEASE_TABLET | Freq: Every day | ORAL | Status: DC
  Administered 2012-01-22 – 2012-01-23 (×2): 10 meq via ORAL
  Filled 2012-01-21 (×2): qty 1

## 2012-01-21 NOTE — ED Provider Notes (Signed)
I saw and evaluated the patient, reviewed the resident's note and I agree with the findings and plan. I personally evaluated the ECG and agree with the interpretation of the resident  The patient has both typical and atypical symptoms of chest pain.  She does have a stented coronary artery.  The vas that the cardiology team evaluate the patient for possible unstable angina.  The patient will be admitted tonight   Lyanne Co, MD 01/21/12 [phone]

## 2012-01-21 NOTE — Progress Notes (Signed)
ANTICOAGULATION CONSULT NOTE - Initial Consult  Pharmacy Consult for Coumadin Indication: atrial fibrillation  Allergies  Allergen Reactions  . Ace Inhibitors Cough    Enalapril  benicor    . Crestor (Rosuvastatin Calcium)     Fatigue and leg pain   . Lipitor (Atorvastatin Calcium)     Myalgias   . Niaspan (Niacin)     Intol   . Olmesartan Cough  . Prednisone     REACTION: "retained fluid"    Vital Signs: Temp: 98.1 F (36.7 C) (07/26 0927) Temp src: Oral (07/26 0927) BP: 141/65 mmHg (07/26 1730) Pulse Rate: 53  (07/26 1730)  Labs:  Basename 01/21/12 1719 01/21/12 1004  HGB -- 11.7*  HCT -- 36.3  PLT -- 181  APTT -- --  LABPROT 21.8* --  INR 1.86* --  HEPARINUNFRC -- --  CREATININE -- 0.62  CKTOTAL -- --  CKMB -- --  TROPONINI -- <0.30    The CrCl is unknown because both a height and weight (above a minimum accepted value) are required for this calculation.   Medical History: Past Medical History  Diagnosis Date  . Hypertension     with h/o hypertensive urgency  . Glaucoma   . Tremor, essential   . Diabetes mellitus with neuropathy   . B12 deficiency   . Hyperlipidemia   . Atrial fibrillation     a. on chronic anticoagulation  b. Tiksosyn discontinued and amiodarone initiated 11/2011,  . Chronic diastolic heart failure     echo 05/2009: mild LVH, EF 55-60%, grade 2 diast dysfxn, mild LAE, PASP  . CAD (coronary artery disease)     a.  LHC 12/27/11: mLAD 30%, prox-mid D1 90% (small);  CFX patent stent, OM3 small 95%,, mRCA 30%, EF 65%  -  med Rx; b. 07/2011 NSTEMI - PCI/DES LCX - Resolute stent;  . Carotid stenosis     dopplers 6/12: 1-39% bilat ICA  . Pulmonary fibrosis     Dr. Vassie Loll;  patient taken off O2 in 8/12; dyspnea felt CHF>>ILD  . Skin cancer   . Warfarin anticoagulation   . Hypomagnesemia   . Colon polyps   . Syncope     unknown etiology  . Diabetes mellitus     Medications:  Coumadin 5mg  daily except 10mg  Mon/Fri  Assessment: 78  y/o female patient admitted with chest pain, on chronic coumadin for h/o afib. INR subtherapeutic and patient has not taken dose today.  Goal of Therapy:  INR 2-3 Monitor platelets by anticoagulation protocol: Yes   Plan:  Coumadin 10mg  today and f/u daily protime.   Verlene Mayer, PharmD, BCPS Pager [phone] 01/21/2012,6:04 PM

## 2012-01-21 NOTE — ED Notes (Signed)
Pt to department via EMS from home- reports that she started having midsternal chest pain this morning. Pt denies any n/v, SOB or radiation of pain. Pt hx of 1 stent. Pt took 324 asa PTA. Bp-190/70 Hr-66.

## 2012-01-21 NOTE — Consult Note (Signed)
CARDIOLOGY CONSULT NOTE   Patient ID: JORGE AMPARO MRN: [id number] DOB/AGE: 07/19/1929 78 y.o.  Admit date: 01/21/2012  Primary Physician   [name], MD Primary Cardiologist : Dr Antoine Poche Reason for Consultation  Chest pain  NWG:[id number] Kathie Rhodes Baranski is a 78 y.o. female  a history including Afib on chronic anticoagulation, pulmonary fibrosis,CAD s/p CATH 2/13 had a DES in LCx and recent CATH 7/13 being treated medically. Yesterday, she felt "bad all day " and although she did not have any chest pain, she took one NTG SL and that made her feel  better. She presents today with c/o chest pain. Onset this morning around 2:30am after she used the bathroom. She describes it as aching and pressure, localized on the left side of her chest. It was constant at 5/10 both at rest and with activity. It was associated with mild SOB, no diaphoresis, no n/v or palpitations. She took 4 baby ASA , did not feel better so called her brother around 8am  who advised her to call 911. According to her, pain is nothing compared to her first heart attack. Currently, she describes her pain as chest pressure/ache at 4/10. No n/v, diaphoresis, palpitations, headaches or SOB. The pain finally resolved about 2pm. She did not take NTG at home and was not given any by EMS or ER staff. She did receive MSO4 2 mg. She is currently resting comfortably.  Past Medical History  Diagnosis Date  . Hypertension     with h/o hypertensive urgency  . Glaucoma   . Tremor, essential   . Diabetes mellitus with neuropathy   . B12 deficiency   . Hyperlipidemia   . Atrial fibrillation     a. on chronic anticoagulation  b. Tiksosyn discontinued and amiodarone initiated 11/2011,  . Chronic diastolic heart failure     echo 05/2009: mild LVH, EF 55-60%, grade 2 diast dysfxn, mild LAE, PASP  . CAD (coronary artery disease)     a.  LHC 12/27/11: mLAD 30%, prox-mid D1 90% (small);  CFX patent stent, OM3 small 95%,, mRCA 30%, EF 65%  -  med Rx; b.  07/2011 NSTEMI - PCI/DES LCX - Resolute stent;  . Carotid stenosis     dopplers 6/12: 1-39% bilat ICA  . Pulmonary fibrosis     Dr. Vassie Loll;  patient taken off O2 in 8/12; dyspnea felt CHF>>ILD  . Skin cancer   . Warfarin anticoagulation   . Hypomagnesemia   . Colon polyps   . Syncope     unknown etiology  . Diabetes mellitus      Past Surgical History  Procedure Date  . Appendectomy   . Nose surgery   . Colonoscopy   . Cardiac catheterization 2013    stent    Allergies  Allergen Reactions  . Ace Inhibitors Cough    Enalapril  benicor    . Crestor (Rosuvastatin Calcium)     Fatigue and leg pain   . Lipitor (Atorvastatin Calcium)     Myalgias   . Niaspan (Niacin)     Intol   . Olmesartan Cough  . Prednisone     REACTION: "retained fluid"    I have reviewed the patient's current medications   . isosorbide mononitrate  60 mg Oral Daily  . metoprolol  100 mg Oral BID  .  morphine injection  2 mg Intravenous Once     hydrALAZINE, nitroGLYCERIN  Medication Sig  amiodarone (PACERONE) 400 MG tablet Take 1 tablet (400 mg  total) by mouth daily.  Cholecalciferol (VITAMIN D) 1000 UNITS capsule Take 1,000 Units by mouth daily. OTC  clopidogrel (PLAVIX) 75 MG tablet Take 75 mg by mouth daily.  colesevelam (WELCHOL) 625 MG tablet Take 650 mg by mouth daily.   Cyanocobalamin (VITAMIN B-12 IJ) Inject 1 application as directed every 30 (thirty) days. Given at the doctor's office at the end of each month  fish oil-omega-3 fatty acids 1000 MG capsule Take 1 g by mouth daily.  furosemide (LASIX) 40 MG tablet Take 20 mg by mouth daily.  insulin aspart protamine-insulin aspart (NOVOLOG MIX 70/30 FLEXPEN) (70-30) 100 UNIT/ML injection Inject 22 Units into the skin daily with breakfast.  isosorbide mononitrate (IMDUR) 60 MG 24 hr tablet Take 60 mg by mouth daily.  latanoprost (XALATAN) 0.005 % ophthalmic solution Place 1 drop into both eyes at bedtime.   magnesium oxide (MAG-OX) 400  MG tablet Take 400 mg by mouth 3 (three) times daily.  metFORMIN (GLUCOPHAGE) 1000 MG tablet Take 1,000 mg by mouth 2 (two) times daily with a meal.  metoprolol (LOPRESSOR) 100 MG tablet Take 1 tablet (100 mg total) by mouth 2 (two) times daily.  nitroGLYCERIN (NITROSTAT) 0.4 MG SL tablet Place 0.4 mg under the tongue every 5 (five) minutes as needed. As needed for chest pain  potassium chloride (K-DUR,KLOR-CON) 10 MEQ tablet Take 10 mEq by mouth daily.  warfarin (COUMADIN) 5 MG tablet Take 5-10 mg by mouth daily. 2 tablets Monday and Friday and 1 tablet all other days.     History   Social History  . Marital Status: Widowed    Spouse Name: N/A    Number of Children: 1  . Years of Education: N/A   Occupational History  . retired    Social History Main Topics  . Smoking status: Never Smoker   . Smokeless tobacco: Never Used  . Alcohol Use: No  . Drug Use: No  . Sexually Active: No   Other Topics Concern  . She drives, does her own ADLs.   Social History Narrative   Widow, lives alone at home.   Family History  Problem Relation Age of Onset  . Throat cancer Brother   . Prostate cancer Father   . Diabetes Mother   . Diabetes Brother     multiple  . Heart disease Brother   . Colon cancer Neg Hx      ROS: She feels tired a lot. The fatigue was worse than usual yesterday. She has had +chest pain, mild dizziness for 6 weeks or more, mild SOB. No GI symptoms. No joint problems. No recent illnesses, fevers or chills. All other systems negative, except as listed in the HPI. Physical Exam: Blood pressure 165/70, pulse 67, temperature 98.1 F (36.7 C), temperature source Oral, resp. rate 18, SpO2 97.00%.  General: Well developed, well nourished, female in no acute distress Head: Eyes PERRLA, No xanthomas.   Normocephalic and atraumatic, oropharynx without edema or exudate.  Lungs: bilateral rales and crackles Heart: Heart  rate and rhythm regular with S1, S2; 2/6 systolic  murmur. pulses are 2+ all 4 extrem.   Neck: Bilateral carotid bruits. No lymphadenopathy. JVD at about 10 cm. Abdomen: +tender LUQ .Bowel sounds present, abdomen soft,  without  hernias noted. Msk:  No spine or cva tenderness. No weakness, no joint deformities or effusions. Extremities: No clubbing, cyanosis or edema Neuro: Alert and oriented X 3. No focal deficits noted. Psych:  Good affect, responds appropriately Skin: No rashes or  lesions noted.  Labs:   Lab Results  Component Value Date   WBC 9.9 01/21/2012   HGB 11.7* 01/21/2012   HCT 36.3 01/21/2012   MCV 81.0 01/21/2012   PLT 181 01/21/2012     Lab 01/21/12 1004  NA 134*  K 3.8  CL 95*  CO2 28  BUN 15  CREATININE 0.62  CALCIUM 10.1  PROT 7.4  BILITOT 0.6  ALKPHOS 77  ALT 16  AST 14  GLUCOSE 277*    Basename 01/21/12 1004  CKTOTAL --  CKMB --  TROPONINI <0.30   Cardiac Cath: 12/27/2011 Left main: Short segment, no obstructive disease.  Left Anterior Descending Artery: Moderate to large sized vessel that does not reach the apex. The mid vessel after the takeoff of the diagonal branch has a tubular 30% stenosis. The first diagonal branch is a small caliber, bifurcating vessel with diffuse 50% stenosis throughout the proximal segment and diffuse 90% stenosis in the inferior sub-branch. This is unchanged since the last catheterization and would be too small in diameter to consider PCI.  Circumflex Artery: Patent stent proximally with no restenosis. Distal vessel with 30% stenosis. The first and second obtuse marginal branches are very small with plaque disease. The third obtuse marginal branch is a very small caliber vessel (~1.67mm) and has a 95% ostial stenosis. This vessel is too small to consider PCI. This is unchanged from last cath.  Right Coronary Artery: Large dominant vessel with diffuse 30% mid vessel stenosis. There is calcification throughout the mid vessel. Mild plaque distally. No obstructive lesions noted.  Left  Ventricular Angiogram: LVEF=65%.   Echo: 09/15/2011 Study Conclusions - Left ventricle: The cavity size was normal. Wall thickness was increased in a pattern of moderate LVH. Systolic function was normal. The estimated ejection fraction was in the range of 55% to 60%. - Aortic valve: Mildly to moderately calcified annulus. There was very mild stenosis. Valve area: 1.77cm^2(VTI). Valve area: 1.63cm^2 (Vmax). - Atrial septum: No defect or patent foramen ovale was identified.  ECG: SR 21-Jan-2012 09:26:46  SINUS RHYTHM ~ normal P axis, V-rate 50- 99 BORDERLINE AV CONDUCTION DELAY ~ PR >200, V-rate 50- 90 LEFT AXIS DEVIATION ~ QRS axis (-30,-90) ABNRM R PROG, CONSIDER ASMI OR LEAD PLACEMENT ~ Q >68mS, diminished R, V2 BORDERLINE ST DEPRESSION, LATERAL LEADS ~ ST <-0.34mV, I aVL V5 V6 BASELINE WANDER IN LEAD(S) III Vent. rate 66 BPM PR interval 200 ms QRS duration 100 ms QT/QTc 452/474 ms P-R-T axes 0 -36 87  Radiology:  Dg Chest 2 View 01/21/2012  *RADIOLOGY REPORT*  Clinical Data: Chest tightness.  CHEST - 2 VIEW  Comparison: PA and lateral chest 12/24/2011.  Findings: There is cardiomegaly but no edema. No focal airspace disease or effusion.  Left lower lobe calcified granuloma is identified.  Remote compression fracture deformity mid thoracic spine is identified.  IMPRESSION: No acute finding.  Cardiomegaly.  Original Report Authenticated By: Bernadene Bell. D'ALESSIO, M.D.    ASSESSMENT AND PLAN:    1. [place]/CADs/p stent- pt presents with chest pain, onset this AM. Her current pain is 4/10 which she describes as pressure/ache. She walked to the bathroom in the ER without any worsening of symptoms. Her first POC trop is negative. EKG is non-acute. Will admit to telemetry, cycle cardiac biomarkers,continue plavix, ASA,BB,statin, NTG SL prn and plan d/c home in am to follow up in office if enzymes are normal and no chest pain.   2. HTN- elevated. She has a h/o HTN urgency.She  has not taken any  of her medications today.Will initiate PRN hydralazine and restart her home meds.  3. Afib- On amiodarone and chronic anticoagulation with coumadin. Will continue both. Check PT/INR and consult pharmacy for coumadin dosing.  4. Chronic diastolic HF- pt mildly fluid overloaded by exam, CXR OK, MD advise on IV Lasix x 1-2 doses or continue home Rx.  5. HLD- Last lipid panel 2/13 abnormal. Pt allergic to statins, niaspan. Continue home meds. Follow-up next month as outpatient.  6. DM- continue home Rx.   Signed: Theodore Demark 01/21/2012, 2:28 PM Co-Sign MD Seen in ER with Theodore Demark, PA.  Patient demonstrates evidence of mild fluid overload with elevated JVP.  Her bilateral pulmonary rales are loud and impressive and are most likely related to her pulmonary fibrosis with now superimposed increased interstitial fluid from acute on chronic diastolic CHF. Will give IV lasix tonight and observe response. If enzymes remain negative expect DC in am.

## 2012-01-21 NOTE — ED Provider Notes (Signed)
History     CSN: [id number]  Arrival date & time 01/21/12  0915   First MD Initiated Contact with Patient 01/21/12 [phone]      Chief Complaint  Patient presents with  . Chest Pain    (Consider location/radiation/quality/duration/timing/severity/associated sxs/prior treatment) HPI  78 year old female with coronary artery disease, Pulmonary fibrosis, Diabetes, And diastolic heart failure who presents with chest pain. The chest pain is described as pressure, and rated at 5/10. It started yesterday with vague symptoms, but then turned into chest pressure overnight. It is centrally located and does not radiate. It is associated with mild dyspnea, although the patient notes that she was supposed to be on oxygen at home but never uses it. She denies diaphoresis, nausea, vomiting, or exacerbation with exercise.She also denies a history of poor embolism, denies swelling or tenderness of her calves, and notes that she is on Coumadin and Plavix. She also took 4 baby aspirin this morning, but she was concerned about her heart.  Past Medical History  Diagnosis Date  . Hypertension     with h/o hypertensive urgency  . Glaucoma   . Tremor, essential   . Diabetes mellitus with neuropathy   . B12 deficiency   . Hyperlipidemia   . Atrial fibrillation     a. on chronic anticoagulation  b. Tiksosyn discontinued and amiodarone initiated 11/2011,  . Chronic diastolic heart failure     echo 05/2009: mild LVH, EF 55-60%, grade 2 diast dysfxn, mild LAE, PASP  . CAD (coronary artery disease)     a.  LHC 12/27/11: mLAD 30%, prox-mid D1 90% (small);  CFX patent stent, OM3 small 95%,, mRCA 30%, EF 65%  -  med Rx; b. 07/2011 NSTEMI - PCI/DES LCX - Resolute stent;  . Carotid stenosis     dopplers 6/12: 1-39% bilat ICA  . Pulmonary fibrosis     Dr. Vassie Loll;  patient taken off O2 in 8/12; dyspnea felt CHF>>ILD  . Skin cancer   . Warfarin anticoagulation   . Hypomagnesemia   . Colon polyps   . Syncope     unknown  etiology  . Diabetes mellitus     Past Surgical History  Procedure Date  . Appendectomy   . Nose surgery   . Colonoscopy   . Cardiac catheterization 2013    stent    Family History  Problem Relation Age of Onset  . Throat cancer Brother   . Prostate cancer Father   . Diabetes Mother   . Diabetes Brother     multiple  . Heart disease Brother   . Colon cancer Neg Hx     History  Substance Use Topics  . Smoking status: Never Smoker   . Smokeless tobacco: Never Used  . Alcohol Use: No    OB History    Grav Para Term Preterm Abortions TAB SAB Ect Mult Living                  Review of Systems  All other systems reviewed and are negative.    Allergies  Ace inhibitors; Crestor; Lipitor; Niaspan; Olmesartan; and Prednisone  Home Medications   Current Outpatient Rx  Name Route Sig Dispense Refill  . AMIODARONE HCL 400 MG PO TABS Oral Take 1 tablet (400 mg total) by mouth daily. 60 tablet 1  . VITAMIN D 1000 UNITS PO CAPS Oral Take 1,000 Units by mouth daily. OTC    . CLOPIDOGREL BISULFATE 75 MG PO TABS Oral Take 75 mg by  mouth daily.    . COLESEVELAM HCL 625 MG PO TABS Oral Take 650 mg by mouth daily.     Marland Kitchen VITAMIN B-12 IJ Injection Inject 1 application as directed every 30 (thirty) days. Given at the doctor's office at the end of each month    . OMEGA-3 FATTY ACIDS 1000 MG PO CAPS Oral Take 1 g by mouth daily.    . FUROSEMIDE 40 MG PO TABS Oral Take 20 mg by mouth daily.    . INSULIN ASPART PROT & ASPART (70-30) 100 UNIT/ML [place] SUSP Subcutaneous Inject 22 Units into the skin daily with breakfast.    . ISOSORBIDE MONONITRATE ER 60 MG PO TB24 Oral Take 60 mg by mouth daily.    Marland Kitchen LATANOPROST 0.005 % OP SOLN Both Eyes Place 1 drop into both eyes at bedtime.     Marland Kitchen MAGNESIUM OXIDE 400 MG PO TABS Oral Take 400 mg by mouth 3 (three) times daily.    Marland Kitchen METFORMIN HCL 1000 MG PO TABS Oral Take 1,000 mg by mouth 2 (two) times daily with a meal.    . METOPROLOL TARTRATE 100 MG  PO TABS Oral Take 1 tablet (100 mg total) by mouth 2 (two) times daily. 30 tablet 6  . NITROGLYCERIN 0.4 MG SL SUBL Sublingual Place 0.4 mg under the tongue every 5 (five) minutes as needed. As needed for chest pain    . POTASSIUM CHLORIDE CRYS ER 10 MEQ PO TBCR Oral Take 10 mEq by mouth daily.    . WARFARIN SODIUM 5 MG PO TABS Oral Take 5-10 mg by mouth daily. 2 tablets Monday and Friday and 1 tablet all other days.      BP 135/55  Pulse 56  Temp 98.1 F (36.7 C) (Oral)  Resp 18  SpO2 100%  Physical Exam  Vitals reviewed. Constitutional: She appears well-developed and well-nourished. No distress.  HENT:  Head: Normocephalic and atraumatic.  Eyes: Conjunctivae are normal. Pupils are equal, round, and reactive to light.  Neck: Normal range of motion.  Cardiovascular: Normal rate, regular rhythm and normal heart sounds.   Pulmonary/Chest: Effort normal. No respiratory distress. She has rales in the right upper field, the right middle field, the right lower field, the left upper field, the left middle field and the left lower field.  Abdominal: Soft. Bowel sounds are normal. She exhibits no distension.  Neurological: She displays tremor. No cranial nerve deficit or sensory deficit.       Tremor of her jaw  Skin: Skin is warm and dry. She is not diaphoretic.    ED Course  Procedures (including critical care time)  Labs Reviewed  CBC - Abnormal; Notable for the following:    Hemoglobin 11.7 (*)     All other components within normal limits  COMPREHENSIVE METABOLIC PANEL - Abnormal; Notable for the following:    Sodium 134 (*)     Chloride 95 (*)     Glucose, Bld 277 (*)     GFR calc non Af Amer 84 (*)     All other components within normal limits  TROPONIN I   Dg Chest 2 View  01/21/2012  *RADIOLOGY REPORT*  Clinical Data: Chest tightness.  CHEST - 2 VIEW  Comparison: PA and lateral chest 12/24/2011.  Findings: There is cardiomegaly but no edema. No focal airspace disease or  effusion.  Left lower lobe calcified granuloma is identified.  Remote compression fracture deformity mid thoracic spine is identified.  IMPRESSION: No acute finding.  Cardiomegaly.  Original Report Authenticated By: Bernadene Bell. Maricela Curet, M.D.    Date: 01/21/2012  Rate: 66  Rhythm: normal sinus rhythm  QRS Axis: left  Intervals: borderline prolonged QT, borderline prolonged QRS  ST/T Wave abnormalities: nonspecific ST changes  Conduction Disutrbances:left bundle branch block  Narrative Interpretation: borderline LBBB with persistently STE in V3 seen in previous ECG  Old EKG Reviewed: changes noted    1. Chest pain       MDM  The patient's pain was mildly improved through the course of her stay. This likely represented angina. Her cardiologist was consulted for possible admission. Her care was signed over to Azalia Bilis, MD.

## 2012-01-22 DIAGNOSIS — R079 Chest pain, unspecified: Principal | ICD-10-CM

## 2012-01-22 DIAGNOSIS — I5033 Acute on chronic diastolic (congestive) heart failure: Secondary | ICD-10-CM

## 2012-01-22 DIAGNOSIS — I4891 Unspecified atrial fibrillation: Secondary | ICD-10-CM

## 2012-01-22 DIAGNOSIS — Z7901 Long term (current) use of anticoagulants: Secondary | ICD-10-CM

## 2012-01-22 LAB — CARDIAC PANEL(CRET KIN+CKTOT+MB+TROPI)
Total CK: 20 U/L (ref 7–177)
Troponin I: <0.3 ng/mL (ref <0.30)
Troponin I: <0.3 ng/mL (ref <0.30)

## 2012-01-22 LAB — PROTIME-INR
INR: 1.64 — ABNORMAL HIGH (ref 0.00–1.49)
Prothrombin Time: 19.7 seconds — ABNORMAL HIGH (ref 11.6–15.2)

## 2012-01-22 LAB — LIPID PANEL
Cholesterol: 186 mg/dL (ref 0–200)
Triglycerides: 169 mg/dL — ABNORMAL HIGH (ref <150)

## 2012-01-22 LAB — GLUCOSE, CAPILLARY
Glucose-Capillary: 288 mg/dL — ABNORMAL HIGH (ref 70–99)
Glucose-Capillary: 337 mg/dL — ABNORMAL HIGH (ref 70–99)

## 2012-01-22 LAB — BASIC METABOLIC PANEL
CO2: 26 mEq/L (ref 19–32)
Calcium: 10.3 mg/dL (ref 8.4–10.5)
GFR calc non Af Amer: 84 mL/min — ABNORMAL LOW (ref >90)
Sodium: 133 mEq/L — ABNORMAL LOW (ref 135–145)

## 2012-01-22 MED ORDER — WARFARIN SODIUM 10 MG PO TABS
10.0000 mg | ORAL_TABLET | Freq: Once | ORAL | Status: AC
  Administered 2012-01-22: 10 mg via ORAL
  Filled 2012-01-22: qty 1

## 2012-01-22 MED ORDER — AMLODIPINE BESYLATE 5 MG PO TABS
5.0000 mg | ORAL_TABLET | Freq: Every day | ORAL | Status: DC
  Administered 2012-01-22 – 2012-01-23 (×2): 5 mg via ORAL
  Filled 2012-01-22 (×2): qty 1

## 2012-01-22 NOTE — Progress Notes (Signed)
SUBJECTIVE: The patient is doing well today.  At this time, she denies chest pain, shortness of breath, or any new concerns. She does not feel ready for discharge today.     Marland Kitchen amiodarone  400 mg Oral Daily  . amLODipine  5 mg Oral Daily  . cholecalciferol  1,000 Units Oral Daily  . clopidogrel  75 mg Oral Daily  . colesevelam  625 mg Oral Daily  . furosemide  40 mg Intravenous Once  . furosemide  20 mg Oral Daily  . insulin aspart protamine-insulin aspart  22 Units Subcutaneous Q breakfast  . isosorbide mononitrate  60 mg Oral Daily  . latanoprost  1 drop Both Eyes QHS  . magnesium oxide  400 mg Oral TID  . metoprolol  100 mg Oral BID  . omega-3 acid ethyl esters  1 g Oral Daily  . potassium chloride  10 mEq Oral Daily  . sodium chloride  3 mL Intravenous Q12H  . warfarin  10 mg Oral ONCE-1800  . Warfarin - Pharmacist Dosing Inpatient   Does not apply q1800  . DISCONTD: enoxaparin (LOVENOX) injection  40 mg Subcutaneous Q24H  . DISCONTD: fish oil-omega-3 fatty acids  1 g Oral Daily  . DISCONTD: Vitamin D  1,000 Units Oral Daily  . DISCONTD: warfarin  10 mg Oral Once  . DISCONTD: warfarin  5-10 mg Oral Daily      OBJECTIVE: Physical Exam: Filed Vitals:   01/22/12 0230 01/22/12 0435 01/22/12 0627 01/22/12 1111  BP: 199/77 215/72 167/67 174/69  Pulse: 58  68 67  Temp:      TempSrc:      Resp:      Height:      Weight:      SpO2:        Intake/Output Summary (Last 24 hours) at 01/22/12 1207 Last data filed at 01/22/12 1109  Gross per 24 hour  Intake    363 ml  Output    300 ml  Net     63 ml    Telemetry reveals sinus rhythm  GEN- The patient is well appearing, alert and oriented x 3 today.   Head- normocephalic, atraumatic Eyes-  Sclera clear, conjunctiva pink Ears- hearing intact Oropharynx- clear Neck- supple  Lymph- no cervical lymphadenopathy Lungs- dry rales, normal WOB CV- Regular rate and rhythm GI- soft, NT, ND, + BS Extremities- no clubbing,  cyanosis, or edema   LABS: Basic Metabolic Panel:  Basename 01/22/12 0237 01/21/12 2112 01/21/12 1004  NA 133* -- 134*  K 3.7 -- 3.8  CL 96 -- 95*  CO2 26 -- 28  GLUCOSE 354* -- 277*  BUN 16 -- 15  CREATININE 0.62 0.71 --  CALCIUM 10.3 -- 10.1  MG -- -- --  PHOS -- -- --   Liver Function Tests:  Basename 01/21/12 1004  AST 14  ALT 16  ALKPHOS 77  BILITOT 0.6  PROT 7.4  ALBUMIN 3.6   No results found for this basename: LIPASE:2,AMYLASE:2 in the last 72 hours CBC:  Basename 01/21/12 2112 01/21/12 1004  WBC 8.2 9.9  NEUTROABS -- --  HGB 11.3* 11.7*  HCT 34.2* 36.3  MCV 80.7 81.0  PLT 193 181   Cardiac Enzymes:  Basename 01/22/12 0843 01/22/12 0237 01/21/12 2112  CKTOTAL 21 20 18   CKMB 2.4 2.5 2.4  CKMBINDEX -- -- --  TROPONINI <0.30 <0.30 <0.30  Fasting Lipid Panel:  Basename 01/22/12 0237  CHOL 186  HDL 50  LDLCALC  102*  TRIG 169*  CHOLHDL 3.7  LDLDIRECT --   Thyroid Function Tests: No results found for this basename: TSH,T4TOTAL,FREET3,T3FREE,THYROIDAB in the last 72 hours Anemia Panel: No results found for this basename: VITAMINB12,FOLATE,FERRITIN,TIBC,IRON,RETICCTPCT in the last 72 hours  RADIOLOGY: Dg Chest 2 View  01/21/2012  *RADIOLOGY REPORT*  Clinical Data: Chest tightness.  CHEST - 2 VIEW  Comparison: PA and lateral chest 12/24/2011.  Findings: There is cardiomegaly but no edema. No focal airspace disease or effusion.  Left lower lobe calcified granuloma is identified.  Remote compression fracture deformity mid thoracic spine is identified.  IMPRESSION: No acute finding.  Cardiomegaly.  Original Report Authenticated By: Bernadene Bell. Maricela Curet, M.D.   Dg Chest 2 View  12/24/2011  *RADIOLOGY REPORT*  Clinical Data: Loss of consciousness.  Weakness.  CHEST - 2 VIEW  Comparison: Chest x-ray 11/16/2011.  Findings: Lung volumes are low.  No consolidative airspace disease. No pleural effusions.  Calcified granuloma in the left mid lung unchanged.  Mild  pulmonary venous congestion without frank pulmonary edema.  Heart size is borderline enlarged.  Mediastinal contours are unremarkable.  Atherosclerotic calcifications are noted within the arch of the aorta.  IMPRESSION: 1.  Low lung volumes without radiographic evidence of acute cardiopulmonary disease. 2.  Atherosclerosis.  Original Report Authenticated By: Florencia Reasons, M.D.   Ct Head Wo Contrast  12/24/2011  *RADIOLOGY REPORT*  Clinical Data: Loss of consciousness, syncope.  CT HEAD WITHOUT CONTRAST  Technique:  Contiguous axial images were obtained from the base of the skull through the vertex without contrast.  Comparison: 08/31/2011  Findings: There is atrophy and chronic small vessel disease changes. No acute intracranial abnormality.  Specifically, no hemorrhage, hydrocephalus, mass lesion, acute infarction, or significant intracranial injury.  No acute calvarial abnormality. Visualized paranasal sinuses and mastoids clear.  Orbital soft tissues unremarkable.  IMPRESSION: No acute intracranial abnormality.  Atrophy, chronic microvascular disease.  Original Report Authenticated By: Cyndie Chime, M.D.    ASSESSMENT AND PLAN:  Active Problems:  * No active hospital problems. *    ASSESSMENT AND PLAN:  1. CADs/p stent-   CMs negative No further inpatient workup planned unless chest pain returns Resume home medicine Add norvasc  2. HTN- elevated. She has a h/o HTN urgency. Add norvasc  3. Afib- On amiodarone and chronic anticoagulation with coumadin. Will continue both. Decrease amiodarone to 200mg  daily at discharge  4. Chronic diastolic HF- euvolemic Continue gentle PO diuresis  5. HLD- Last lipid panel 2/13 abnormal. Pt allergic to statins, niaspan. Continue home meds. Follow-up next month as outpatient.   6. DM- continue home Rx.  She does not feel ready for discharge today.  I will add norvasc. If BP is stable, would anticipate discharge tomorrow.  Hillis Range,  MD 01/22/2012 12:07 PM

## 2012-01-22 NOTE — Progress Notes (Signed)
ANTICOAGULATION CONSULT NOTE - Follow Up Consult  Pharmacy Consult for Coumadin Indication: atrial fibrillation  Allergies  Allergen Reactions  . Ace Inhibitors Cough    Enalapril  benicor    . Crestor (Rosuvastatin Calcium)     Fatigue and leg pain   . Lipitor (Atorvastatin Calcium)     Myalgias   . Niaspan (Niacin)     Intol   . Olmesartan Cough  . Prednisone     REACTION: "retained fluid"    Patient Measurements: Height: 5\' 2"  (157.5 cm) Weight: 162 lb 9.6 oz (73.755 kg) IBW/kg (Calculated) : 50.1   Vital Signs: BP: 167/67 mmHg (07/27 0627) Pulse Rate: 68  (07/27 0627)  Labs:  Basename 01/22/12 0237 01/21/12 2112 01/21/12 1719 01/21/12 1004  HGB -- 11.3* -- 11.7*  HCT -- 34.2* -- 36.3  PLT -- 193 -- 181  APTT -- 42* -- --  LABPROT -- -- 21.8* --  INR -- -- 1.86* --  HEPARINUNFRC -- -- -- --  CREATININE 0.62 0.71 -- 0.62  CKTOTAL 20 18 -- --  CKMB 2.5 2.4 -- --  TROPONINI <0.30 <0.30 -- <0.30    Estimated Creatinine Clearance: 53.7 ml/min (by C-G formula based on Cr of 0.62).   Medications:  Scheduled:    . amiodarone  400 mg Oral Daily  . cholecalciferol  1,000 Units Oral Daily  . clopidogrel  75 mg Oral Daily  . colesevelam  625 mg Oral Daily  . furosemide  40 mg Intravenous Once  . furosemide  20 mg Oral Daily  . insulin aspart protamine-insulin aspart  22 Units Subcutaneous Q breakfast  . isosorbide mononitrate  60 mg Oral Daily  . latanoprost  1 drop Both Eyes QHS  . magnesium oxide  400 mg Oral TID  . metoprolol  100 mg Oral BID  .  morphine injection  2 mg Intravenous Once  . omega-3 acid ethyl esters  1 g Oral Daily  . potassium chloride  10 mEq Oral Daily  . sodium chloride  3 mL Intravenous Q12H  . warfarin  10 mg Oral Once  . Warfarin - Pharmacist Dosing Inpatient   Does not apply q1800  . DISCONTD: enoxaparin (LOVENOX) injection  40 mg Subcutaneous Q24H  . DISCONTD: fish oil-omega-3 fatty acids  1 g Oral Daily  . DISCONTD: Vitamin  D  1,000 Units Oral Daily  . DISCONTD: warfarin  5-10 mg Oral Daily    Assessment: 44 YOF admitted with chest pain to continue chronic Coumadin for Afib. INR sub-therapeutic on admission yesterday. Coumadin 10mg  ordered, but not charted. INR decreased this am to 1.64.  Patients home regimen was 5mg  daily except 10mg  on Monday and Friday. CBC stable on 7/26. No repeat CBC today. No bleeding noted.   Goal of Therapy:  INR 2-3   Plan:  1. Coumadin 10mg  po x1 tonight. 2. Daily PT/INR.   Fayne Norrie 01/22/2012,8:37 AM

## 2012-01-23 DIAGNOSIS — I5032 Chronic diastolic (congestive) heart failure: Secondary | ICD-10-CM

## 2012-01-23 DIAGNOSIS — R079 Chest pain, unspecified: Secondary | ICD-10-CM

## 2012-01-23 DIAGNOSIS — E119 Type 2 diabetes mellitus without complications: Secondary | ICD-10-CM

## 2012-01-23 LAB — BASIC METABOLIC PANEL
CO2: 28 mEq/L (ref 19–32)
Chloride: 95 mEq/L — ABNORMAL LOW (ref 96–112)
Sodium: 132 mEq/L — ABNORMAL LOW (ref 135–145)

## 2012-01-23 LAB — GLUCOSE, CAPILLARY: Glucose-Capillary: 331 mg/dL — ABNORMAL HIGH (ref 70–99)

## 2012-01-23 LAB — PROTIME-INR: INR: 1.54 — ABNORMAL HIGH (ref 0.00–1.49)

## 2012-01-23 MED ORDER — WARFARIN SODIUM 5 MG PO TABS
5.0000 mg | ORAL_TABLET | Freq: Once | ORAL | Status: DC
  Filled 2012-01-23: qty 1

## 2012-01-23 MED ORDER — AMLODIPINE BESYLATE 5 MG PO TABS: 5.0000 mg | ORAL_TABLET | Freq: Every day | ORAL | Status: DC

## 2012-01-23 MED ORDER — AMIODARONE HCL 200 MG PO TABS: 200.0000 mg | ORAL_TABLET | Freq: Every day | ORAL | Status: DC

## 2012-01-23 MED ORDER — WARFARIN SODIUM 5 MG PO TABS: 5.0000 mg | ORAL_TABLET | Freq: Every day | ORAL | Status: DC

## 2012-01-23 NOTE — Progress Notes (Signed)
SUBJECTIVE: The patient is doing well today.  At this time, she denies chest pain, shortness of breath, or any new concerns. She wants to go home.     Marland Kitchen amiodarone  400 mg Oral Daily  . amLODipine  5 mg Oral Daily  . cholecalciferol  1,000 Units Oral Daily  . clopidogrel  75 mg Oral Daily  . colesevelam  625 mg Oral Daily  . furosemide  40 mg Intravenous Once  . furosemide  20 mg Oral Daily  . insulin aspart protamine-insulin aspart  22 Units Subcutaneous Q breakfast  . isosorbide mononitrate  60 mg Oral Daily  . latanoprost  1 drop Both Eyes QHS  . magnesium oxide  400 mg Oral TID  . metoprolol  100 mg Oral BID  . omega-3 acid ethyl esters  1 g Oral Daily  . potassium chloride  10 mEq Oral Daily  . sodium chloride  3 mL Intravenous Q12H  . warfarin  10 mg Oral ONCE-1800  . Warfarin - Pharmacist Dosing Inpatient   Does not apply q1800  . DISCONTD: warfarin  10 mg Oral Once      OBJECTIVE: Physical Exam: Filed Vitals:   01/22/12 1301 01/22/12 1400 01/22/12 2100 01/23/12 0515  BP: 128/62 118/56 168/68 171/51  Pulse:  50 55 51  Temp:  98.2 F (36.8 C) 98.3 F (36.8 C) 97.4 F (36.3 C)  TempSrc:  Oral Oral   Resp:  20 18 18   Height:      Weight:    160 lb 15 oz (73 kg)  SpO2:  92% 91% 94%    Intake/Output Summary (Last 24 hours) at 01/23/12 0837 Last data filed at 01/23/12 0500  Gross per 24 hour  Intake    963 ml  Output   1000 ml  Net    -37 ml    Telemetry reveals sinus rhythm  GEN- The patient is well appearing, alert and oriented x 3 today.   Head- normocephalic, atraumatic Eyes-  Sclera clear, conjunctiva pink Ears- hearing intact Oropharynx- clear Neck- supple  Lymph- no cervical lymphadenopathy Lungs- dry rales, normal WOB CV- Regular rate and rhythm GI- soft, NT, ND, + BS Extremities- no clubbing, cyanosis, or edema   LABS: Basic Metabolic Panel:  Basename 01/23/12 0621 01/22/12 0237  NA 132* 133*  K 4.4 3.7  CL 95* 96  CO2 28 26    GLUCOSE 315* 354*  BUN 23 16  CREATININE 0.78 0.62  CALCIUM 10.3 10.3  MG -- --  PHOS -- --   Liver Function Tests:  Basename 01/21/12 1004  AST 14  ALT 16  ALKPHOS 77  BILITOT 0.6  PROT 7.4  ALBUMIN 3.6   No results found for this basename: LIPASE:2,AMYLASE:2 in the last 72 hours CBC:  Basename 01/21/12 2112 01/21/12 1004  WBC 8.2 9.9  NEUTROABS -- --  HGB 11.3* 11.7*  HCT 34.2* 36.3  MCV 80.7 81.0  PLT 193 181   Cardiac Enzymes:  Basename 01/22/12 0843 01/22/12 0237 01/21/12 2112  CKTOTAL 21 20 18   CKMB 2.4 2.5 2.4  CKMBINDEX -- -- --  TROPONINI <0.30 <0.30 <0.30  Fasting Lipid Panel:  Basename 01/22/12 0237  CHOL 186  HDL 50  LDLCALC 102*  TRIG 169*  CHOLHDL 3.7  LDLDIRECT --   Thyroid Function Tests: No results found for this basename: TSH,T4TOTAL,FREET3,T3FREE,THYROIDAB in the last 72 hours Anemia Panel: No results found for this basename: VITAMINB12,FOLATE,FERRITIN,TIBC,IRON,RETICCTPCT in the last 72 hours  RADIOLOGY: Dg  Chest 2 View  01/21/2012  *RADIOLOGY REPORT*  Clinical Data: Chest tightness.  CHEST - 2 VIEW  Comparison: PA and lateral chest 12/24/2011.  Findings: There is cardiomegaly but no edema. No focal airspace disease or effusion.  Left lower lobe calcified granuloma is identified.  Remote compression fracture deformity mid thoracic spine is identified.  IMPRESSION: No acute finding.  Cardiomegaly.  Original Report Authenticated By: Bernadene Bell. Maricela Curet, M.D.   Dg Chest 2 View  12/24/2011  *RADIOLOGY REPORT*  Clinical Data: Loss of consciousness.  Weakness.  CHEST - 2 VIEW  Comparison: Chest x-ray 11/16/2011.  Findings: Lung volumes are low.  No consolidative airspace disease. No pleural effusions.  Calcified granuloma in the left mid lung unchanged.  Mild pulmonary venous congestion without frank pulmonary edema.  Heart size is borderline enlarged.  Mediastinal contours are unremarkable.  Atherosclerotic calcifications are noted within the  arch of the aorta.  IMPRESSION: 1.  Low lung volumes without radiographic evidence of acute cardiopulmonary disease. 2.  Atherosclerosis.  Original Report Authenticated By: Florencia Reasons, M.D.   Ct Head Wo Contrast  12/24/2011  *RADIOLOGY REPORT*  Clinical Data: Loss of consciousness, syncope.  CT HEAD WITHOUT CONTRAST  Technique:  Contiguous axial images were obtained from the base of the skull through the vertex without contrast.  Comparison: 08/31/2011  Findings: There is atrophy and chronic small vessel disease changes. No acute intracranial abnormality.  Specifically, no hemorrhage, hydrocephalus, mass lesion, acute infarction, or significant intracranial injury.  No acute calvarial abnormality. Visualized paranasal sinuses and mastoids clear.  Orbital soft tissues unremarkable.  IMPRESSION: No acute intracranial abnormality.  Atrophy, chronic microvascular disease.  Original Report Authenticated By: Cyndie Chime, M.D.    ASSESSMENT AND PLAN:  Active Problems:  * No active hospital problems. *    ASSESSMENT AND PLAN:  1. CADs/p stent-   CMs negative No further inpatient workup planned unless chest pain returns Resume home medicine Norvasc added  2. HTN- elevated. She has a h/o HTN urgency. Continue norvasc 5mg  daily at home.  This can be uptitrated by PCP.  3. Afib- On amiodarone and chronic anticoagulation with coumadin. Will continue both. Decrease amiodarone to 200mg  daily at discharge  4. Chronic diastolic HF- euvolemic Resume home medicine  5. HLD- Last lipid panel 2/13 abnormal. Pt allergic to statins, niaspan. Continue home meds. Follow-up next month as outpatient.   6. DM- continue home Rx.  BS is elevated.  I have encouraged her to follow-up with PCP this week (Dr Kathi Der office)  DC to home Follow-up with Dr Kathi Der office this week for HTN, DM management and INR check. Follow-up with Dr Antoine Poche as scheduled in 4 weeks in [place].  Hillis Range, MD 01/23/2012  8:37 AM

## 2012-01-23 NOTE — Progress Notes (Addendum)
ANTICOAGULATION CONSULT NOTE - Follow Up Consult  Pharmacy Consult for Coumadin Indication: atrial fibrillation  Allergies  Allergen Reactions  . Ace Inhibitors Cough    Enalapril  benicor    . Crestor (Rosuvastatin Calcium)     Fatigue and leg pain   . Lipitor (Atorvastatin Calcium)     Myalgias   . Niaspan (Niacin)     Intol   . Olmesartan Cough  . Prednisone     REACTION: "retained fluid"    Patient Measurements: Height: 5\' 2"  (157.5 cm) Weight: 160 lb 15 oz (73 kg) IBW/kg (Calculated) : 50.1   Vital Signs: Temp: 97.4 F (36.3 C) (07/28 0515) BP: 171/51 mmHg (07/28 0515) Pulse Rate: 51  (07/28 0515)  Labs:  Alexandria [name] 01/23/12 1610 01/22/12 0843 01/22/12 0237 01/21/12 2112 01/21/12 1719 01/21/12 1004  HGB -- -- -- 11.3* -- 11.7*  HCT -- -- -- 34.2* -- 36.3  PLT -- -- -- 193 -- 181  APTT -- -- -- 42* -- --  LABPROT 18.8* 19.7* -- -- 21.8* --  INR 1.54* 1.64* -- -- 1.86* --  HEPARINUNFRC -- -- -- -- -- --  CREATININE 0.78 -- 0.62 0.71 -- --  CKTOTAL -- 21 20 18  -- --  CKMB -- 2.4 2.5 2.4 -- --  TROPONINI -- <0.30 <0.30 <0.30 -- --    Estimated Creatinine Clearance: 53.4 ml/min (by C-G formula based on Cr of 0.78).   Medications:  Scheduled:    . amiodarone  400 mg Oral Daily  . amLODipine  5 mg Oral Daily  . cholecalciferol  1,000 Units Oral Daily  . clopidogrel  75 mg Oral Daily  . colesevelam  625 mg Oral Daily  . furosemide  40 mg Intravenous Once  . furosemide  20 mg Oral Daily  . insulin aspart protamine-insulin aspart  22 Units Subcutaneous Q breakfast  . isosorbide mononitrate  60 mg Oral Daily  . latanoprost  1 drop Both Eyes QHS  . magnesium oxide  400 mg Oral TID  . metoprolol  100 mg Oral BID  . omega-3 acid ethyl esters  1 g Oral Daily  . potassium chloride  10 mEq Oral Daily  . sodium chloride  3 mL Intravenous Q12H  . warfarin  10 mg Oral ONCE-1800  . Warfarin - Pharmacist Dosing Inpatient   Does not apply q1800  . DISCONTD:  warfarin  10 mg Oral Once    Assessment: 51 YOF admitted with chest pain to continue chronic Coumadin for Afib. INR sub-therapeutic on admission on home regimen of 5mg  daily except 10mg  on Monday and Friday. INR today is 1.54 after receiving one dose of 10mg  last night. Since patient is a possible discharge today and INR is suspected to increase tomorrow from 10mg  dose, will recommend giving 5 mg Coumadin tonight, then adjust home regimen to 10mg  on Mondays, Wednesdays, Fridays--5 mg on all other days. Since patient was only slightly sub-therapeutic on her previous home regimen she should only have a slight dose increase. CBC stable on 7/26. No repeat CBC today. No bleeding noted.   Goal of Therapy:  INR 2-3 Monitor platelets by anticoagulation protocol: Yes   Plan:  1. Coumadin 5 mg at 1800. 2. Recommend changing home regimen to 10mg  on MWF, 5mg  on all other days. 3. Per Dr. Iran Sizer should follow up with PCP this week for INR check.  [name], PharmD Clinical Pharmacist Pager: [phone] Phone: [phone] 01/23/2012 10:08 AM

## 2012-01-23 NOTE — Discharge Summary (Signed)
Discharge Summary   Patient ID: Alexandria [name],  MRN: [id number], DOB/AGE: 02/25/1930 78 y.o.  Admit date: 01/21/2012 Discharge date: 01/23/2012  Primary Physician: Alexandria Heap, MD Primary Cardiologist: Alexandria Rotunda, MD  Discharge Diagnoses Principal Problem:  *Chest pain Active Problems:  HYPERLIPIDEMIA  HYPERTENSION  CAD  A-fib  Chronic diastolic CHF (congestive heart failure)   Allergies Allergies  Allergen Reactions  . Ace Inhibitors Cough    Enalapril  benicor    . Crestor (Rosuvastatin Calcium)     Fatigue and leg pain   . Lipitor (Atorvastatin Calcium)     Myalgias   . Niaspan (Niacin)     Intol   . Olmesartan Cough  . Prednisone     REACTION: "retained fluid"    Diagnostic Studies/Procedures  PA/LATERAL CHEST X-RAY - 01/21/12  CHEST - 2 VIEW  Comparison: PA and lateral chest 12/24/2011.  Findings: There is cardiomegaly but no edema. No focal airspace  disease or effusion. Left lower lobe calcified granuloma is  identified. Remote compression fracture deformity mid thoracic  spine is identified.  IMPRESSION:  No acute finding. Cardiomegaly.  History of Present Illness/Hospital Course  Alexandria [name] is a 78yo Caucasian female with the above problem list who was admitted to [facility] on 01/21/12 for chest pain.   She has a history of CAD with PCI (DES-LCx in 07/2011) and cath 12/2011 with medical management. She also has a history of atrial fibrillation on chronic Coumadin anticoagulation and chronic diastolic CHF. The day prior to admission, she felt poorly. She did not endorse chest pain, but took NTG with some relief. On ambulating to the bathroom, she noted left-sided chest pressure, described as constant, rated at a 5/10 with mild shortness of breath. She took ASA 81mg  x 4, did not feel better and called EMS. In the ED, EKG revealed no evidence of ischemia. POC trop-I was WNL. She was admitted for ACS rule-out. She was continued on her  home medications. Cardiac biomarkers were cycled and returned WNL x 3. Her symptoms improved. She was noted to be mildly hypertensive this admission, and Norvasc was added. This effectively controlled her BP. She remained largely euvolemic and there was no evidence of a-fib with RVR. Coumadin was dosed per pharmacy. INRs were subtherapeutic on and throughout admission (1.5 range). Pharmacy recommended a dosing adjustment (10mg  MWF and 5mg  all other days, repeat INR next week). This will be arranged on discharge. The patient was assessed by Alexandria [name] this morning who found her stable for discharge. Amiodarone will be decreased to 200mg  and Norvasc added. She will follow-up with her PCP regarding INR check, BP, DM and cholesterol management. She will follow-up in [place] with Dr. Antoine [name] in 3-4 weeks. This information has been clearly outlined in the discharge AVS.   Discharge Vitals:  Blood pressure 151/60, pulse 57, temperature 97.4 F (36.3 C), temperature source Oral, resp. rate 18, height 5\' 2"  (1.575 m), weight 73 kg (160 lb 15 oz), SpO2 94.00%.   Weight change: -0.755 kg (-1 lb 10.6 oz)  Labs: Recent Labs  [facility] 01/21/12 2112 01/21/12 1004   WBC 8.2 9.9   HGB 11.3* 11.7*   HCT 34.2* 36.3   MCV 80.7 81.0   PLT 193 181    Lab 01/23/12 0621 01/22/12 0237 01/21/12 2112 01/21/12 1004  NA 132* 133* -- 134*  K 4.4 3.7 -- 3.8  CL 95* 96 -- 95*  CO2 28 26 -- 28  BUN 23 16 -- 15  CREATININE 0.78 0.62 0.71 --  CALCIUM 10.3 10.3 -- 10.1  PROT -- -- -- 7.4  BILITOT -- -- -- 0.6  ALKPHOS -- -- -- 77  ALT -- -- -- 16  AST -- -- -- 14  AMYLASE -- -- -- --  LIPASE -- -- -- --  GLUCOSE 315* 354* -- 277*    Recent Labs  Basename 01/22/12 0843 01/22/12 0237 01/21/12 2112   CKTOTAL 21 20 18    CKMB 2.4 2.5 2.4   CKMBINDEX -- -- --   TROPONINI <0.30 <0.30 <0.30    Recent Labs  Basename 01/22/12 0237   CHOL 186   HDL 50   LDLCALC 102*   TRIG 169*   CHOLHDL 3.7   LDLDIRECT --     Disposition:  Discharge Orders    Future Appointments: Provider: Department: Dept Phone: [name]:   02/10/2012 8:45 AM Alexandria George, MD [facility] [phone] None   02/21/2012 10:30 AM Alexandria Milch, MD [facility] [phone] None   02/23/2012 3:30 PM Alexandria Rotunda, MD [facility] [phone] [facility]   03/01/2012 1:00 PM Alexandria George, MD [facility] [phone] None   01/17/2013 1:00 PM [facility] [phone] [facility]   01/17/2013 2:00 PM Alexandria Bio, NP [facility] [phone] [facility]     Follow-up Information    Follow up with Alexandria Heap, MD. Schedule an appointment as soon as possible for a visit in 1 week. (Please follow-up for BP check, INR, cholesterol and diabetes management)    Contact information:   [address] [place] [address] [phone]       Follow up with Alexandria [name] [facility] [place]. (The office will call you with an appointment date and time to see Dr. Antoine [name] at the [facility] office in [place]. )    Contact information:   [address] [place] [phone]          Discharge Medications:  Medication List  As of 01/23/2012 11:15 AM   START taking these medications         amLODipine 5 MG tablet   Commonly known as: NORVASC   Take 1 tablet (5 mg total) by mouth daily.         CHANGE how you take these medications         amiodarone 200 MG tablet   Commonly known as: PACERONE   Take 1 tablet (200 mg total) by mouth daily.   What changed: - medication strength - dose      warfarin 5 MG tablet   Commonly known as: COUMADIN   Take 1-2 tablets (5-10 mg total) by mouth daily. 2 tablets Monday, Wednesday and Friday and 1 tablet all other days.   What changed: doctor's instructions         CONTINUE taking these medications         clopidogrel 75 MG tablet   Commonly known as: PLAVIX      colesevelam 625 MG tablet   Commonly known as: WELCHOL       fish oil-omega-3 fatty acids 1000 MG capsule      furosemide 40 MG tablet   Commonly known as: LASIX      isosorbide mononitrate 60 MG 24 hr tablet   Commonly known as: IMDUR      latanoprost 0.005 % ophthalmic solution   Commonly known as: XALATAN      magnesium oxide 400 MG tablet   Commonly known as: MAG-OX  metFORMIN 1000 MG tablet   Commonly known as: GLUCOPHAGE      metoprolol 100 MG tablet   Commonly known as: LOPRESSOR   Take 1 tablet (100 mg total) by mouth 2 (two) times daily.      nitroGLYCERIN 0.4 MG SL tablet   Commonly known as: NITROSTAT      NOVOLOG MIX 70/30 FLEXPEN (70-30) 100 UNIT/ML injection   Generic drug: insulin aspart protamine-insulin aspart      potassium chloride 10 MEQ tablet   Commonly known as: K-DUR,KLOR-CON      VITAMIN B-12 IJ      Vitamin D 1000 UNITS capsule          Where to get your medications    These are the prescriptions that you need to pick up. We sent them to a specific pharmacy, so you will need to go there to get them.   [id number] - [place], [place] - [address]    [address] [place] [address]    Phone: [phone]        amiodarone 200 MG tablet   amLODipine 5 MG tablet         Information on where to get these meds is not yet available. Ask your nurse or doctor.         warfarin 5 MG tablet           Outstanding Labs/Studies: PT/INR at PCP's office next week   Duration of Discharge Encounter: Greater than 30 minutes including physician time.  Signed, R. Hurman Horn, PA-C 01/23/2012, 11:15 AM  I have seen, examined the patient, and reviewed the above assessment and plan.  Changes to above are made where necessary.    Co Sign: Hillis Range, MD 01/23/2012 12:39 PM

## 2012-01-26 NOTE — Procedures (Unsigned)
CAROTID DUPLEX EXAM  INDICATION:  Carotid disease.  HISTORY: Diabetes:  Yes. Cardiac:  No. Hypertension:  Yes. Smoking:  No. Previous Surgery:  No. CV History:  Dizziness, which may be due to a new medication, per patient. Amaurosis Fugax No, Paresthesias No, Hemiparesis No.                                      RIGHT             LEFT Brachial systolic pressure:         192               189 Brachial Doppler waveforms:         Normal            Normal Vertebral direction of flow:        Antegrade         Antegrade DUPLEX VELOCITIES (cm/sec) CCA peak systolic                   53                81 ECA peak systolic                   205               87 ICA peak systolic                   91                78 ICA end diastolic                   16                14 PLAQUE MORPHOLOGY:                  Calcific          Calcific PLAQUE AMOUNT:                      Minimal           Minimal PLAQUE LOCATION:                    CCA, ICA, ECA     CCA, ICA, ECA  IMPRESSION: 1. Bilateral common carotid artery disease present. 2. Bilateral internal carotid artery disease in the 1% to 39% range;     however, velocities may be under-estimated due to calcific plaque     with acoustic shadowing. 3. Stable examination in comparison to the previous study.  ___________________________________________ Di Kindle. Edilia Bo, M.D.  EM/MEDQ  D:  01/19/2012  T:  01/19/2012  Job:  [id number]

## 2012-02-01 ENCOUNTER — Emergency Department (HOSPITAL_COMMUNITY): Payer: Medicare Other

## 2012-02-01 ENCOUNTER — Emergency Department (HOSPITAL_COMMUNITY)
Admission: EM | Admit: 2012-02-01 | Discharge: 2012-02-01 | Disposition: A | Discharge status: Home / Self Care | Payer: Medicare Other | Attending: Emergency Medicine | Admitting: Emergency Medicine

## 2012-02-01 ENCOUNTER — Encounter (HOSPITAL_COMMUNITY): Payer: Self-pay | Admitting: *Deleted

## 2012-02-01 DIAGNOSIS — I498 Other specified cardiac arrhythmias: Secondary | ICD-10-CM | POA: Insufficient documentation

## 2012-02-01 DIAGNOSIS — Z79899 Other long term (current) drug therapy: Secondary | ICD-10-CM | POA: Insufficient documentation

## 2012-02-01 DIAGNOSIS — R001 Bradycardia, unspecified: Secondary | ICD-10-CM

## 2012-02-01 DIAGNOSIS — R55 Syncope and collapse: Secondary | ICD-10-CM | POA: Insufficient documentation

## 2012-02-01 DIAGNOSIS — E1142 Type 2 diabetes mellitus with diabetic polyneuropathy: Secondary | ICD-10-CM | POA: Insufficient documentation

## 2012-02-01 DIAGNOSIS — I4891 Unspecified atrial fibrillation: Secondary | ICD-10-CM | POA: Insufficient documentation

## 2012-02-01 DIAGNOSIS — I5032 Chronic diastolic (congestive) heart failure: Secondary | ICD-10-CM | POA: Insufficient documentation

## 2012-02-01 DIAGNOSIS — I509 Heart failure, unspecified: Secondary | ICD-10-CM | POA: Insufficient documentation

## 2012-02-01 DIAGNOSIS — E785 Hyperlipidemia, unspecified: Secondary | ICD-10-CM | POA: Insufficient documentation

## 2012-02-01 DIAGNOSIS — Z794 Long term (current) use of insulin: Secondary | ICD-10-CM | POA: Insufficient documentation

## 2012-02-01 DIAGNOSIS — E1149 Type 2 diabetes mellitus with other diabetic neurological complication: Secondary | ICD-10-CM | POA: Insufficient documentation

## 2012-02-01 DIAGNOSIS — I10 Essential (primary) hypertension: Secondary | ICD-10-CM | POA: Insufficient documentation

## 2012-02-01 DIAGNOSIS — I2510 Atherosclerotic heart disease of native coronary artery without angina pectoris: Secondary | ICD-10-CM | POA: Insufficient documentation

## 2012-02-01 DIAGNOSIS — R42 Dizziness and giddiness: Secondary | ICD-10-CM | POA: Insufficient documentation

## 2012-02-01 DIAGNOSIS — Z7901 Long term (current) use of anticoagulants: Secondary | ICD-10-CM | POA: Insufficient documentation

## 2012-02-01 LAB — CBC WITH DIFFERENTIAL/PLATELET
Basophils Absolute: 0 10*3/uL (ref 0.0–0.1)
Eosinophils Absolute: 0.1 10*3/uL (ref 0.0–0.7)
Eosinophils Relative: 1 % (ref 0–5)
Lymphs Abs: 1.4 10*3/uL (ref 0.7–4.0)
MCH: 25.9 pg — ABNORMAL LOW (ref 26.0–34.0)
MCHC: 31.5 g/dL (ref 30.0–36.0)
MCV: 82.2 fL (ref 78.0–100.0)
Platelets: 195 10*3/uL (ref 150–400)
RDW: 14.5 % (ref 11.5–15.5)

## 2012-02-01 LAB — POCT I-STAT, CHEM 8
Calcium, Ion: 1.32 mmol/L — ABNORMAL HIGH (ref 1.13–1.30)
Chloride: 105 mEq/L (ref 96–112)
Glucose, Bld: 92 mg/dL (ref 70–99)
HCT: 32 % — ABNORMAL LOW (ref 36.0–46.0)

## 2012-02-01 LAB — APTT: aPTT: 28 seconds (ref 24–37)

## 2012-02-01 LAB — LIPASE, BLOOD: Lipase: 29 U/L (ref 11–59)

## 2012-02-01 MED ORDER — METOPROLOL TARTRATE 50 MG PO TABS: 50.0000 mg | ORAL_TABLET | Freq: Two times a day (BID) | ORAL | Status: DC

## 2012-02-01 NOTE — Consult Note (Signed)
Consult Note  Patient ID: Alexandria [name] MRN: [id number], SOB: Oct 28, 1929 78 y.o. Date of Encounter: 02/01/2012, 4:16 PM  Primary Physician: Alexandria Heap, MD Primary Cardiologist: Dr. Antoine [name] in [place]  Chief Complaint: dizziness  HPI: 78 y.o. female w/ PMHx significant for CAD (DES to LCx 07/2011, patent by cath 12/2011, on Plavix), A.Fib (on coumadin and amiodarone), Chronic Diastolic CHF, Carotid stenosis (20-39% bilat ICA 12/2011), HTN, DM, HLD, and Syncope who presented to [facility] on 02/01/2012 with complaints of dizziness.  Diagnosed with A.Fib in ~2011 and managed with coumadin and rate control. Was initiated on Tikosyn in early March 2013 for recurrent symptomatic atrial fib. Was hospitalized again in mid March with symptomatic atrial fibrillation/flutter and Tikosyn was increased to BID with close monitoring of QTc and electrolytes. She was evaluated by Dr. Johney [name] who noted amiodarone has been avoided due to pulmonary fibrosis and felt her to be high risk for catheter ablation. It was noted that if she fails therapy with Tikosyn it would be reasonable to have pulmonary see her to consider a trial of amiodarone. Echo that admission revealed mod LVH, EF 55-60%. Upon discharge she wore an event monitor with results reviewed by Dr. Antoine [name] on 4/10 who noted she remained in NSR. She was hospitalized again on 4/14 with severe bradycardia in the setting of hyperkalemia requiring temporary transvenous pacing. She was treated and discharged back on Tikosyn on 4/19. She had a follow up with Dr. Antoine [name] on 5/8 and was noted to be maintaining NSR. On 11/16/11 she presented to the ED with chest pain, sob and dizziness and was found to be in A.Fib w/ RVR after not taking her morning Tikosyn or Lopressor. She spontaneously converted to NSR in the ED and was discharged home. In June she was noted to have positive occult stools at her PCP office and was evaluated by GI. Hgb at that time was  11.3. Colonoscopy was planned, but she was hospitalized shortly after.   Alexandria [name] was hospitalized and evaluated for syncope in early July with no clear etiology found. Cardiac cath revealed nonobstructive disease. CT head was w/o acute abnormalities. No arrhythmias on telemetry. She did have QT prolongation on EKG for which Tikosyn was stopped and she was placed on Amiodarone. During follow up with Dr. Antoine [name] she denied any further syncopal episodes, but did c/o dizziness for which the amiodarone dose was decreased to 400mg  daily. Carotid doppler was performed on 01/19/12 revealing stable bilat ICA stenosis. She was hospitalized for chest pain on 01/21/12 at which time she ruled out for MI. She was discharged on 7/28 with the addition of Norvasc for poorly controlled HTN and a decreased dose of Amiodarone (200mg  daily).  Has had dizziness for "months". Today felt "swimmy headed" while sitting down like she might pass out so she called her neighbor who called EMS. She put her pulse ox probe on her finger and noted her heart rate was 39 (Unsure of what it was upon EMS arrival). She states this is the same dizziness she has had in the past. No palpitations, sob, chest pain, focal neuro deficits, or syncope. Denies recent illness, fever, chills, orthopnea, edema, abd pain, melena, or hematochezia. Is scheduled to have a colonoscopy on Thursday. Stopped coumadin in preparation for this. Continued on Plavix.  EKG revealed sinus bradycardia 45bpm, no acute ischemic changes. Telemetry shows sinus rhythm with rates 40-50s. CXR was without acute cardiopulmonary abnormalities. Labs are significant for normal troponin, Hgb 10.9, INR  1.42, unremarkable BMET. She is feeling well without complaints during my evaluation.   Past Medical History  Diagnosis Date  . Hypertension     with h/o hypertensive urgency  . Glaucoma   . Tremor, essential   . Diabetes mellitus with neuropathy   . B12 deficiency   .  Hyperlipidemia   . Atrial fibrillation     a. on chronic anticoagulation  b. Tiksosyn discontinued and amiodarone initiated 11/2011,  . Chronic diastolic heart failure     echo 05/2009: mild LVH, EF 55-60%, grade 2 diast dysfxn, mild LAE, PASP  . CAD (coronary artery disease)     a.  LHC 12/27/11: mLAD 30%, prox-mid D1 90% (small);  CFX patent stent, OM3 small 95%,, mRCA 30%, EF 65%  -  med Rx; b. 07/2011 NSTEMI - PCI/DES LCX - Resolute stent;  . Carotid stenosis     dopplers 6/12: 1-39% bilat ICA  . Pulmonary fibrosis     Dr. Vassie [name];  patient taken off O2 in 8/12; dyspnea felt CHF>>ILD  . Skin cancer   . Warfarin anticoagulation   . Hypomagnesemia   . Colon polyps   . Syncope     unknown etiology  . Diabetes mellitus     12/27/11 - Cardiac Cath Hemodynamic Findings:  Central aortic pressure: 176/71  Left ventricular pressure: 188/2/12  Angiographic Findings:  Left main: Short segment, no obstructive disease.  Left Anterior Descending Artery: Moderate to large sized vessel that does not reach the apex. The mid vessel after the takeoff of the diagonal branch has a tubular 30% stenosis. The first diagonal branch is a small caliber, bifurcating vessel with diffuse 50% stenosis throughout the proximal segment and diffuse 90% stenosis in the inferior sub-branch. This is unchanged since the last catheterization and would be too small in diameter to consider PCI.  Circumflex Artery: Patent stent proximally with no restenosis. Distal vessel with 30% stenosis. The first and second obtuse marginal branches are very small with plaque disease. The third obtuse marginal branch is a very small caliber vessel (~1.34mm) and has a 95% ostial stenosis. This vessel is too small to consider PCI. This is unchanged from last cath.  Right Coronary Artery: Large dominant vessel with diffuse 30% mid vessel stenosis. There is calcification throughout the mid vessel. Mild plaque distally. No obstructive lesions noted.  Left  Ventricular Angiogram: LVEF=65%.  Impression:  1. Single vessel CAD with patent stent in the proximal Circumflex.  2. There is also small branch vessel disease but no vessels that are large enough for PCI.  2. Normal LV function.  Recommendations: No further ischemic workup.    09/15/11 - 2D Echo Study Conclusions: - Left ventricle: The cavity size was normal. Wall thickness was increased in a pattern of moderate LVH. Systolic function was normal. The estimated ejection fraction was in the range of 55% to 60%. - Aortic valve: Mildly to moderately calcified annulus. There was very mild stenosis. Valve area: 1.77cm^2(VTI). Valve area: 1.63cm^2 (Vmax). - Atrial septum: No defect or patent foramen ovale was identified.   Surgical History:  Past Surgical History  Procedure Date  . Appendectomy   . Nose surgery   . Colonoscopy   . Cardiac catheterization 2013    stent     Home Meds: Medication Sig  amiodarone (PACERONE) 200 MG tablet Take 1 tablet (200 mg total) by mouth daily.  amLODipine (NORVASC) 5 MG tablet Take 1 tablet (5 mg total) by mouth daily.  aspirin EC 81 MG  tablet Take 324 mg by mouth once.  Cholecalciferol (VITAMIN D) 1000 UNITS capsule Take 1,000 Units by mouth at bedtime. OTC  clopidogrel (PLAVIX) 75 MG tablet Take 75 mg by mouth daily.  colesevelam (WELCHOL) 625 MG tablet Take 625 mg by mouth at bedtime.   Cyanocobalamin (VITAMIN B-12 IJ) Inject 1 application as directed every 30 (thirty) days. Given at the doctor's office at the end of each month  fish oil-omega-3 fatty acids 1000 MG capsule Take 1 g by mouth at bedtime.   furosemide (LASIX) 40 MG tablet Take 40 mg by mouth every morning.   insulin aspart (NOVOLOG FLEXPEN) 100 UNIT/ML injection Inject 10 Units into the skin 3 (three) times daily before meals.  insulin detemir (LEVEMIR) 100 UNIT/ML injection Inject 20 Units into the skin every morning.  isosorbide mononitrate (IMDUR) 60 MG 24 hr tablet Take 60 mg by  mouth every morning.   latanoprost (XALATAN) 0.005 % ophthalmic solution Place 1 drop into both eyes at bedtime.   magnesium oxide (MAG-OX) 400 MG tablet Take 400 mg by mouth 3 (three) times daily.  metFORMIN (GLUCOPHAGE) 1000 MG tablet Take 1,000 mg by mouth 2 (two) times daily with a meal.  metoprolol (LOPRESSOR) 100 MG tablet Take 1 tablet (100 mg total) by mouth 2 (two) times daily.  nitroGLYCERIN (NITROSTAT) 0.4 MG SL tablet Place 0.4 mg under the tongue every 5 (five) minutes as needed. As needed for chest pain  potassium chloride SA (K-DUR,KLOR-CON) 20 MEQ tablet Take 20 mEq by mouth every morning.  warfarin (COUMADIN) 5 MG tablet Take 1-2 tablets (5-10 mg total) by mouth daily. 2 tablets Monday, Wednesday and Friday and 1 tablet all other days.    Allergies:  Allergies  Allergen Reactions  . Ace Inhibitors Cough    Enalapril  benicor    . Crestor (Rosuvastatin Calcium)     Fatigue and leg pain   . Lipitor (Atorvastatin Calcium)     Myalgias   . Niaspan (Niacin)     Intol   . Olmesartan Cough  . Prednisone     REACTION: "retained fluid"    History   Social History  . Marital Status: Widowed    Spouse Name: N/A    Number of Children: 1  . Years of Education: N/A   Occupational History  . retired    Social History Main Topics  . Smoking status: Never Smoker   . Smokeless tobacco: Never Used  . Alcohol Use: No  . Drug Use: No  . Sexually Active: No   Other Topics Concern  . Not on file   Social History Narrative   Widow, lives alone at home.     Family History  Problem Relation Age of Onset  . Throat cancer Brother   . Prostate cancer Father   . Diabetes Mother   . Diabetes Brother     multiple  . Heart disease Brother   . Colon cancer Neg Hx     Review of Systems: General: negative for chills, fever, night sweats or weight changes.  Cardiovascular: negative for chest pain, shortness of breath, dyspnea on exertion, edema, orthopnea, palpitations,  paroxysmal nocturnal dyspnea  Dermatological: negative for rash Respiratory: negative for cough or wheezing Urologic: negative for hematuria Abdominal: negative for nausea, vomiting, diarrhea, bright red blood per rectum, melena, or hematemesis Neurologic: (+)dizziness; negative for visual changes, syncope All other systems reviewed and are otherwise negative except as noted above.  Labs:   Component Value Date  WBC 10.3 02/01/2012   HGB 10.9* 02/01/2012   HCT 32.0* 02/01/2012   MCV 82.2 02/01/2012   PLT 195 02/01/2012    Lab 02/01/12 1430  NA 140  K 4.6  CL 105  CO2 24  BUN 22  CREATININE 1.10  GLUCOSE 92  Results for NAJMA, BOZARTH (MRN [id number]) as of 02/01/2012 17:20  Ref. Range 02/01/2012 14:28  Lipase Latest Range: 11-59 U/L 29   Basename 02/01/12 1429  TROPONINI <0.30    02/01/2012 14:08  Prothrombin Time 17.6 (H)  INR 1.42    Radiology/Studies:   02/01/2012 - Chest Port 1 View Findings: Cardiac silhouette is enlarged compared to prior.  There are low lung volumes basilar atelectasis.  No pulmonary edema or pneumothorax.  No focal consolidation.  IMPRESSION: Decreased lung volumes compared to prior.  Cardiomegaly.      EKG: 02/01/12 @ 1344 - sinus bradycardia w/ 1st degree AV block 45bpm, no acute ischemic changes  Physical Exam: Blood pressure 120/103, pulse 54, temperature 98.2 F (36.8 C), temperature source Oral, resp. rate 18, SpO2 95.00%. General: Overweight white female in no acute distress. Head: Normocephalic, atraumatic, sclera non-icteric, nares are without discharge Neck: Supple. Negative for carotid bruits. JVD not elevated. Lungs: Bibasilar dry crackles without wheezes or rhonchi. Breathing is unlabored. Heart: RRR with S1 S2. No murmurs, rubs, or gallops appreciated. Abdomen: Soft, non-tender, non-distended with normoactive bowel sounds. No rebound/guarding. No obvious abdominal masses. Msk:  Strength and tone appear normal for age. Extremities: No edema.  No clubbing or cyanosis. Distal pedal pulses are 2+ and equal bilaterally. Neuro: Alert and oriented X 3. Moves all extremities spontaneously. Psych:  Responds to questions appropriately with a normal affect.    ASSESSMENT AND PLAN:  78 y.o. female w/ PMHx significant for CAD (DES to LCx 07/2011, patent by cath 12/2011, on Plavix), A.Fib (on coumadin and amiodarone), Chronic Diastolic CHF, Carotid stenosis (20-39% bilat ICA 12/2011), HTN, DM, HLD, and Syncope who presented to [facility] on 02/01/2012 with complaints of dizziness.  1. Dizziness 2. Sinus bradycardia with 1st degree AV block 3. Anemia 4. Paroxysmal Atrial Fibrillation on coumadin and amiodarone 5. CAD 6. Chronic Diastolic CHF 7. Carotid Stenosis 8. Hypertension  Signed, HOPE, JESSICA PA-C 02/01/2012, 4:16 PM  Patient seen and examined with [facility], PA-C. We discussed all aspects of the encounter. I agree with the assessment and plan as stated above.   Patient presented to ER with symptomatic bradycardia. Recently placed on amiodarone for PAF. Dose subsequently decreased from 400 daily to 200 daily. Has also been on lopressor 100 bid. Says she typically wakes up feeling fine but then takes her meds and gets weak and dizzy. HR at home today was 39. Hast been in low to mid 40s here in ER. Now back up to 55. Labs OK. Will have her cut lopressor to 50 bid (may need to cut more) and place 2 week event monitor to follow. She will f/u with Dr. Antoine [name]. She may be approaching the point where a PPM may benefit her though no clear indication at this point.  Daniel Bensimhon,MD 5:29 PM

## 2012-02-01 NOTE — ED Notes (Signed)
Pt discharged by [facility] [employer]. To lobby in wheelchair in NAD at this time

## 2012-02-01 NOTE — ED Notes (Signed)
PT reported to EMS that while sitting in a chair today she felt dizzy and light headed.

## 2012-02-01 NOTE — ED Provider Notes (Signed)
History     CSN: [id number]  Arrival date & time 02/01/12  1342   First MD Initiated Contact with Patient 02/01/12 1501      Chief Complaint  Patient presents with  . Near Syncope    (Consider location/radiation/quality/duration/timing/severity/associated sxs/prior treatment) HPI  The patient presents after a near-syncopal episode.  She recalls that she was folding laundry, felt acutely lightheaded, and had to sit down.  She did not entirely lose consciousness, and after some time felt back to normal.  Immediately after that she called EMS.  Per report the patient had a heart rate in the low 30s on their arrival.  Per reports the patient was hemodynamically otherwise stable through transport.  On arrival the patient has no complaints, states that she is back to her baseline root She states that she has been in her usual state of health, and aside from initiating Norvasc 2 weeks ago has no new medication.  Past Medical History  Diagnosis Date  . Hypertension     with h/o hypertensive urgency  . Glaucoma   . Tremor, essential   . Diabetes mellitus with neuropathy   . B12 deficiency   . Hyperlipidemia   . Atrial fibrillation     a. on chronic anticoagulation  b. Tiksosyn discontinued and amiodarone initiated 11/2011,  . Chronic diastolic heart failure     echo 05/2009: mild LVH, EF 55-60%, grade 2 diast dysfxn, mild LAE, PASP  . CAD (coronary artery disease)     a.  LHC 12/27/11: mLAD 30%, prox-mid D1 90% (small);  CFX patent stent, OM3 small 95%,, mRCA 30%, EF 65%  -  med Rx; b. 07/2011 NSTEMI - PCI/DES LCX - Resolute stent;  . Carotid stenosis     dopplers 6/12: 1-39% bilat ICA  . Pulmonary fibrosis     Dr. Vassie Loll;  patient taken off O2 in 8/12; dyspnea felt CHF>>ILD  . Skin cancer   . Warfarin anticoagulation   . Hypomagnesemia   . Colon polyps   . Syncope     unknown etiology  . Diabetes mellitus     Past Surgical History  Procedure Date  . Appendectomy   . Nose surgery     . Colonoscopy   . Cardiac catheterization 2013    stent    Family History  Problem Relation Age of Onset  . Throat cancer Brother   . Prostate cancer Father   . Diabetes Mother   . Diabetes Brother     multiple  . Heart disease Brother   . Colon cancer Neg Hx     History  Substance Use Topics  . Smoking status: Never Smoker   . Smokeless tobacco: Never Used  . Alcohol Use: No    OB History    Grav Para Term Preterm Abortions TAB SAB Ect Mult Living                  Review of Systems  Constitutional:       HPI  HENT:       HPI otherwise negative  Eyes: Negative.   Respiratory:       HPI, otherwise negative  Cardiovascular:       HPI, otherwise nmegative  Gastrointestinal: Negative for vomiting.  Genitourinary:       HPI, otherwise negative  Musculoskeletal:       HPI, otherwise negative  Skin: Negative.   Neurological: Negative for syncope.    Allergies  Ace inhibitors; Crestor; Lipitor; Niaspan; Olmesartan; and  Prednisone  Home Medications   Current Outpatient Rx  Name Route Sig Dispense Refill  . AMIODARONE HCL 200 MG PO TABS Oral Take 1 tablet (200 mg total) by mouth daily. 30 tablet 3  . AMLODIPINE BESYLATE 5 MG PO TABS Oral Take 1 tablet (5 mg total) by mouth daily. 30 tablet 3  . ASPIRIN EC 81 MG PO TBEC Oral Take 324 mg by mouth once.    Marland Kitchen VITAMIN D 1000 UNITS PO CAPS Oral Take 1,000 Units by mouth at bedtime. OTC    . CLOPIDOGREL BISULFATE 75 MG PO TABS Oral Take 75 mg by mouth daily.    . COLESEVELAM HCL 625 MG PO TABS Oral Take 625 mg by mouth at bedtime.     Marland Kitchen VITAMIN B-12 IJ Injection Inject 1 application as directed every 30 (thirty) days. Given at the doctor's office at the end of each month    . OMEGA-3 FATTY ACIDS 1000 MG PO CAPS Oral Take 1 g by mouth at bedtime.     . FUROSEMIDE 40 MG PO TABS Oral Take 40 mg by mouth every morning.     . INSULIN ASPART 100 UNIT/ML [place] SOLN Subcutaneous Inject 10 Units into the skin 3 (three) times  daily before meals.    . INSULIN DETEMIR 100 UNIT/ML [place] SOLN Subcutaneous Inject 20 Units into the skin every morning.    . ISOSORBIDE MONONITRATE ER 60 MG PO TB24 Oral Take 60 mg by mouth every morning.     Marland Kitchen LATANOPROST 0.005 % OP SOLN Both Eyes Place 1 drop into both eyes at bedtime.     Marland Kitchen MAGNESIUM OXIDE 400 MG PO TABS Oral Take 400 mg by mouth 3 (three) times daily.    Marland Kitchen METFORMIN HCL 1000 MG PO TABS Oral Take 1,000 mg by mouth 2 (two) times daily with a meal.    . METOPROLOL TARTRATE 100 MG PO TABS Oral Take 1 tablet (100 mg total) by mouth 2 (two) times daily. 30 tablet 6  . NITROGLYCERIN 0.4 MG SL SUBL Sublingual Place 0.4 mg under the tongue every 5 (five) minutes as needed. As needed for chest pain    . POTASSIUM CHLORIDE CRYS ER 20 MEQ PO TBCR Oral Take 20 mEq by mouth every morning.    . WARFARIN SODIUM 5 MG PO TABS Oral Take 1-2 tablets (5-10 mg total) by mouth daily. 2 tablets Monday, Wednesday and Friday and 1 tablet all other days.      BP 131/50  Pulse 48  Temp 98.2 F (36.8 C) (Oral)  Resp 16  SpO2 96%  Physical Exam  Nursing note and vitals reviewed. Constitutional: She is oriented to person, place, and time. She appears well-developed and well-nourished. No distress.  HENT:  Head: Normocephalic and atraumatic.  Eyes: Conjunctivae and EOM are normal.  Cardiovascular: Regular rhythm.  Bradycardia present.   Pulmonary/Chest: Effort normal and breath sounds normal. No stridor. No respiratory distress.  Abdominal: She exhibits no distension.  Musculoskeletal: She exhibits no edema.  Neurological: She is alert and oriented to person, place, and time. No cranial nerve deficit.  Skin: Skin is warm and dry.  Psychiatric: She has a normal mood and affect.    ED Course  Procedures (including critical care time)  Labs Reviewed  PROTIME-INR - Abnormal; Notable for the following:    Prothrombin Time 17.6 (*)     All other components within normal limits  CBC WITH  DIFFERENTIAL - Abnormal; Notable for the following:  Hemoglobin 10.3 (*)     HCT 32.7 (*)     MCH 25.9 (*)     Neutro Abs 7.8 (*)     All other components within normal limits  POCT I-STAT, CHEM 8 - Abnormal; Notable for the following:    Calcium, Ion 1.32 (*)     Hemoglobin 10.9 (*)     HCT 32.0 (*)     All other components within normal limits  APTT  LIPASE, BLOOD  TROPONIN I   Dg Chest Port 1 View  02/01/2012  *RADIOLOGY REPORT*  Clinical Data: Near-syncope  PORTABLE CHEST - 1 VIEW  Comparison: Chest radiograph 01/21/2012  Findings: Cardiac silhouette is enlarged compared to prior.  There are low lung volumes basilar atelectasis.  No pulmonary edema or pneumothorax.  No focal consolidation.  IMPRESSION: Decreased lung volumes compared to prior.  Cardiomegaly.  Original Report Authenticated By: Genevive Bi, M.D.     No diagnosis found.  Cardiac 41 sinus bradycardia abnormal Pulse ox 99% room air normal   Date: 02/01/2012  Rate: 45  Rhythm: sinus bradycardia  QRS Axis: left  Intervals: PR prolonged and QT prolonged  ST/T Wave abnormalities: t wave abnormalities  Conduction Disutrbances:first-degree A-V block  and left anterior fascicular block  Narrative Interpretation:   Old EKG Reviewed: not remarkably different from 01/21/12    MDM  This elderly female presents after a near-syncopal episode that followed exertion.  Notably, on exam the patient is without complaints, but is bradycardic.  This, and the report of a heart rate in the 30s during the patient's episode is concerning for sick sinus versus other dysrhythmia.  The resolution of any symptoms is somewhat reassuring, but given the patient's age, history of similar events, new medications, she'll be admitted for further evaluation and management.    Gerhard Munch, MD 02/01/12 [phone]

## 2012-02-02 ENCOUNTER — Telehealth: Payer: Self-pay | Admitting: Gastroenterology

## 2012-02-02 NOTE — Telephone Encounter (Signed)
This pt saw her cardiologist in the ER yesterday for a low heart rate of about 38-40.  This has happened seven times this year per pt.  Her cardiologist wants her to wear a halter for 2 weeks and cut her Metoprolol to 50mg  BID instead of 100 mg daily.  She is questioning if she should start the halter today or wait until Friday, after her procedure.  I told her I would give this info to Dr. Christella Hartigan and make sure he still wants to proceed with procedure tomorrow.   Dr. Christella Hartigan,    Please advice.  Thanks,  [employer]

## 2012-02-02 NOTE — Telephone Encounter (Signed)
With symptomatic brady cardia, should cancel the EGD for tomorrow.  Needs to schedule ROV with me in 2-3 weeks to revisit the question.  Thanks

## 2012-02-02 NOTE — Telephone Encounter (Signed)
Pt has been scheduled a ROV for 02/25/12 ENDO was called and colon cx.

## 2012-02-03 ENCOUNTER — Ambulatory Visit (HOSPITAL_COMMUNITY): Admission: RE | Admit: 2012-02-03 | Payer: Medicare Other | Source: Ambulatory Visit | Admitting: Gastroenterology

## 2012-02-03 ENCOUNTER — Encounter (HOSPITAL_COMMUNITY): Admission: RE | Payer: Self-pay | Source: Ambulatory Visit

## 2012-02-03 SURGERY — COLONOSCOPY
Anesthesia: Moderate Sedation

## 2012-02-10 ENCOUNTER — Encounter (INDEPENDENT_AMBULATORY_CARE_PROVIDER_SITE_OTHER): Payer: Medicare Other | Admitting: Ophthalmology

## 2012-02-10 DIAGNOSIS — I10 Essential (primary) hypertension: Secondary | ICD-10-CM

## 2012-02-10 DIAGNOSIS — E11359 Type 2 diabetes mellitus with proliferative diabetic retinopathy without macular edema: Secondary | ICD-10-CM

## 2012-02-10 DIAGNOSIS — E1139 Type 2 diabetes mellitus with other diabetic ophthalmic complication: Secondary | ICD-10-CM

## 2012-02-10 DIAGNOSIS — H35039 Hypertensive retinopathy, unspecified eye: Secondary | ICD-10-CM

## 2012-02-10 DIAGNOSIS — H43819 Vitreous degeneration, unspecified eye: Secondary | ICD-10-CM

## 2012-02-10 DIAGNOSIS — E11311 Type 2 diabetes mellitus with unspecified diabetic retinopathy with macular edema: Secondary | ICD-10-CM

## 2012-02-15 ENCOUNTER — Telehealth: Payer: Self-pay | Admitting: Cardiology

## 2012-02-15 NOTE — Telephone Encounter (Signed)
Fu call °Pt calling back again °

## 2012-02-15 NOTE — Telephone Encounter (Signed)
New problem:  Patient calling C/O feels bad, wearing heart monitor now.- comes off Wednesday. No chest pain, no sob. Would like to be seen sooner

## 2012-02-15 NOTE — Telephone Encounter (Signed)
Per Dr Antoine Poche pt is to stop Amiodarone and metoprolol.  He would like to see her in the office as soon as possible.  appt scheduled for Thursday at 12:30 pm.  Pt is aware of all of the above.  We also discussed the results of her monitor that she has been wearing.  She is aware that she is having 3 second pauses and that stopping the beta blocker will help with that.  She aware that she may need a pace maker however this will be discuss at her office visit on Thursday.  She will call back with any further problems.

## 2012-02-17 ENCOUNTER — Ambulatory Visit (INDEPENDENT_AMBULATORY_CARE_PROVIDER_SITE_OTHER): Payer: Medicare Other | Admitting: Cardiology

## 2012-02-17 ENCOUNTER — Encounter: Payer: Self-pay | Admitting: Cardiology

## 2012-02-17 VITALS — BP 130/78 | HR 97 | Ht 62.0 in | Wt 158.0 lb

## 2012-02-17 DIAGNOSIS — I495 Sick sinus syndrome: Secondary | ICD-10-CM

## 2012-02-17 MED ORDER — METOPROLOL TARTRATE 25 MG PO TABS: 25.0000 mg | ORAL_TABLET | Freq: Two times a day (BID) | ORAL | Status: DC

## 2012-02-17 MED ORDER — FUROSEMIDE 20 MG PO TABS: 20.0000 mg | ORAL_TABLET | Freq: Every morning | ORAL | Status: DC

## 2012-02-17 NOTE — Progress Notes (Signed)
HPI The patient presents for evaluation of weakness and fatigue. She's had continued problems with atrial fibrillation. She's had several hospitalizations with tachyarrhythmias. Earlier this year she also was in the hospital and had a cardiac catheterization for non-Q-wave myocardial infarction which demonstrated nonobstructive disease as reported below. She has been on Tikosyn but was taken off of this because of QT prolongation and treated with amiodarone.  Unfortunately at home she has not felt well. Per plan I have been reducing her amiodarone to a maintenance dose. She had bradycardia and required her dose of beta blocker to be reduced previously. She's been wearing an event monitor which demonstrates sinus bradycardia and frequent 3 second pauses no sustained pauses. She's not had any syncope. However, she feels extremely weak and dizzy. The other day she called and I had her stop her amiodarone and her beta blocker completely. She was slightly tachycardic last night. She's been anxious. She's not having any new chest pressure, neck or arm discomfort. She does have chronic dyspnea but this has not worsened she's not having PND or orthopnea.  Allergies  Allergen Reactions  . Ace Inhibitors Cough    Enalapril  benicor    . Crestor (Rosuvastatin Calcium)     Fatigue and leg pain   . Lipitor (Atorvastatin Calcium)     Myalgias   . Niaspan (Niacin)     Intol   . Olmesartan Cough  . Prednisone     REACTION: "retained fluid"    Current Outpatient Prescriptions  Medication Sig Dispense Refill  . amLODipine (NORVASC) 5 MG tablet Take 1 tablet (5 mg total) by mouth daily.  30 tablet  3  . aspirin EC 81 MG tablet Take 324 mg by mouth once.      . Cholecalciferol (VITAMIN D) 1000 UNITS capsule Take 1,000 Units by mouth at bedtime. OTC      . clopidogrel (PLAVIX) 75 MG tablet Take 75 mg by mouth daily.      . colesevelam (WELCHOL) 625 MG tablet Take 625 mg by mouth at bedtime.       .  Cyanocobalamin (VITAMIN B-12 IJ) Inject 1 application as directed every 30 (thirty) days. Given at the doctor's office at the end of each month      . fish oil-omega-3 fatty acids 1000 MG capsule Take 1 g by mouth at bedtime.       . furosemide (LASIX) 40 MG tablet Take 40 mg by mouth every morning.       . insulin aspart (NOVOLOG FLEXPEN) 100 UNIT/ML injection Inject 10 Units into the skin 3 (three) times daily before meals.      . insulin detemir (LEVEMIR) 100 UNIT/ML injection Inject 20 Units into the skin every morning.      . isosorbide mononitrate (IMDUR) 60 MG 24 hr tablet Take 60 mg by mouth every morning.       . latanoprost (XALATAN) 0.005 % ophthalmic solution Place 1 drop into both eyes at bedtime.       . magnesium oxide (MAG-OX) 400 MG tablet Take 400 mg by mouth 3 (three) times daily.      . metFORMIN (GLUCOPHAGE) 1000 MG tablet Take 1,000 mg by mouth 2 (two) times daily with a meal.      . nitroGLYCERIN (NITROSTAT) 0.4 MG SL tablet Place 0.4 mg under the tongue every 5 (five) minutes as needed. As needed for chest pain      . potassium chloride SA (K-DUR,KLOR-CON) 20 MEQ tablet Take  20 mEq by mouth every morning.      . warfarin (COUMADIN) 5 MG tablet Take 1-2 tablets (5-10 mg total) by mouth daily. 2 tablets Monday, Wednesday and Friday and 1 tablet all other days.      Marland Kitchen DISCONTD: diltiazem (TIAZAC) 420 MG 24 hr capsule Take 420 mg by mouth daily.        Past Medical History  Diagnosis Date  . Hypertension     with h/o hypertensive urgency  . Glaucoma   . Tremor, essential   . Diabetes mellitus with neuropathy   . B12 deficiency   . Hyperlipidemia   . Atrial fibrillation     a. on chronic anticoagulation  b. Tiksosyn discontinued and amiodarone initiated 11/2011,  . Chronic diastolic heart failure     echo 05/2009: mild LVH, EF 55-60%, grade 2 diast dysfxn, mild LAE, PASP  . CAD (coronary artery disease)     a.  LHC 12/27/11: mLAD 30%, prox-mid D1 90% (small);  CFX patent  stent, OM3 small 95%,, mRCA 30%, EF 65%  -  med Rx; b. 07/2011 NSTEMI - PCI/DES LCX - Resolute stent;  . Carotid stenosis     dopplers 6/12: 1-39% bilat ICA  . Pulmonary fibrosis     Dr. Vassie Loll;  patient taken off O2 in 8/12; dyspnea felt CHF>>ILD  . Skin cancer   . Warfarin anticoagulation   . Hypomagnesemia   . Colon polyps   . Syncope     unknown etiology  . Diabetes mellitus     Past Surgical History  Procedure Date  . Appendectomy   . Nose surgery   . Colonoscopy   . Cardiac catheterization 2013    stent   ROS:  As stated in the HPI and negative for all other systems.  PHYSICAL EXAM BP 130/78  Pulse 97  Ht 5\' 2"  (1.575 m)  Wt 158 lb (71.668 kg)  BMI 28.90 kg/m2 GENERAL:  Well appearing HEENT:  Pupils equal round and reactive, fundi not visualized, oral mucosa unremarkable NECK:  No jugular venous distention, waveform within normal limits, carotid upstroke brisk and symmetric, left bruit, no thyromegaly LYMPHATICS:  No cervical, inguinal adenopathy LUNGS:  Clear to auscultation bilaterally BACK:  No CVA tenderness CHEST:  Unremarkable HEART:  PMI not displaced or sustained,S1 and S2 within normal limits, no S3, no S4, no clicks, no rubs, no murmurs ABD:  Flat, positive bowel sounds normal in frequency in pitch, mid bruits, no rebound, no guarding, no midline pulsatile mass, no hepatomegaly, no splenomegaly EXT:  2 plus pulses throughout, no edema, no cyanosis no clubbing SKIN:  No rashes no nodules NEURO:  Cranial nerves II through XII grossly intact, motor grossly intact throughout, resting tremor PSYCH:  Cognitively intact, oriented to person place and time   ASSESSMENT AND PLAN   FIBRILLATION, ATRIAL -  The patient remains on anticoagulation. She's actually in sinus rhythm but has not had to stop the amiodarone and she previously failed Tikosyn.  I don't think at rhythm control will be an option. Rate control will be complicated by her tachybradycardia syndrome. I  think the only achieve adequate treatment eventually is with a backup pacemaker.  She is scheduled to see Dr. Johney Frame next week.   HYPERTENSION -  She was orthostatic in the clinic today. I'm going to reduce her Lasix to 20 mg daily. However, I think she's going to need a little beta blockers on going to restart metoprolol 25 mg twice a day.  ISCHEMIC CARDIOMYOPATHY -  She is euvolemic. She will continue with meds as listed with the change listed above.  SYNCOPE -  She's had no further syncope with the plan is as above.   CAD -  She had nonobstructive disease. She will continue with risk reduction.

## 2012-02-17 NOTE — Patient Instructions (Addendum)
Please decrease Lasix to 20 mg a day Restart Metoprolol 25 mg one twice a day. Continue all other medications as listed  See Dr Johney Frame 8/28 at 10:45 am  Follow up with Dr Antoine Poche will be based on Dr Jenel Lucks evaluation.

## 2012-02-21 ENCOUNTER — Ambulatory Visit (INDEPENDENT_AMBULATORY_CARE_PROVIDER_SITE_OTHER): Payer: Medicare Other | Admitting: Pulmonary Disease

## 2012-02-21 ENCOUNTER — Encounter: Payer: Self-pay | Admitting: Pulmonary Disease

## 2012-02-21 VITALS — BP 122/64 | HR 59 | Temp 97.7°F | Ht 62.0 in | Wt 162.6 lb

## 2012-02-21 DIAGNOSIS — R0602 Shortness of breath: Secondary | ICD-10-CM

## 2012-02-21 NOTE — Assessment & Plan Note (Signed)
Persistent bibasal crackles HRCT 2011 not conclusive for pulm fibrosis   Her dyspnea is improved & cardiac issues predominate. No evidence of fibrosis on FU CXR imaging. Amiodarone has been stopped.  She will FU with [place] as needed only.

## 2012-02-21 NOTE — Patient Instructions (Signed)
Ambulatory satn Good luck with your pacemaker Call us as needed

## 2012-02-21 NOTE — Progress Notes (Signed)
  Subjective:    Patient ID: Alexandria [name], female    DOB: 1930/02/24, 78 y.o.   MRN: [id number]  HPI 81/F never smoker for FU of ILD, coronary artery disease & diastolic congestive heart failure.  She presented with dyspneia on walking short distances, insidious onset x 9 months, gradually progressive, on O2 since 12/10, orthopnea &pedal edema  Echo - RVSP 35, EF 55-60%, grade 2 diastolic dysfunction  Cath >> RA 14, RV 62/19, PA 60/19, PCWP 28, CI 2.3  PFTs >> moderate restriction, intraparenchymal, TLC 61%, DLCO 48%, FVC 52%  ABG >> 7.36/61/75 when hospitalised 12/10  CXR s/o cardiomegaly with pulmonary edema but cannot r/o occult fibrosis.  CT chest jan'11 >> Mild air trapping s/o small airways disease, very mild subpleural fibrotic changes in the lower lobes, 4 mm right upper lobe nodule, Pulmonary arterial hypertension.  ESR 31, TSH nml, RA neg, ANA neg  DId not tolerate empiric steroid trial due to hyperglycemia.  Hosp admission 2/15-28/11, diuresed well, failed cardioversion, dc'd on 80 mg lasix two times a day    02/21/2012 1 yr FU Of o2 x 1 yr Dyspnea has been stable No desatn on walking. not using O2, takes lasix as needed only - no pedal edema  Tachy -bradyrrhythmias with syncope, required temp pacing on one occasion  - Was on amio & metoprolol, amio stopped - PPM being considered, sees EP in 2 ds CXR 02/01/12 no increase in scarring  Denies chest pain, orthopnea, hemoptysis, fever, n/v/d, edema, headache,recent travel or antibiotics.       Review of Systems neg for any significant sore throat, dysphagia, itching, sneezing, nasal congestion or excess/ purulent secretions, fever, chills, sweats, unintended wt loss, pleuritic or exertional cp, hempoptysis, orthopnea pnd or change in chronic leg swelling. Also denies palpitations, heartburn, abdominal pain, nausea, vomiting, diarrhea or change in bowel or urinary habits, dysuria,hematuria, rash, arthralgias, visual complaints,  headache, numbness weakness or ataxia.     Objective:   Physical Exam  Gen. Pleasant, well-nourished, in no distress ENT - no lesions, no post nasal drip Neck: No JVD, no thyromegaly, no carotid bruits Lungs: no use of accessory muscles, no dullness to percussion, bibasal rales, no rhonchi  Cardiovascular: Rhythm regular, heart sounds  normal, no murmurs or gallops, no peripheral edema Musculoskeletal: No deformities, no cyanosis or clubbing        Assessment & Plan:

## 2012-02-23 ENCOUNTER — Encounter: Payer: Self-pay | Admitting: Internal Medicine

## 2012-02-23 ENCOUNTER — Ambulatory Visit: Payer: Medicare Other | Admitting: Cardiology

## 2012-02-23 ENCOUNTER — Telehealth: Payer: Self-pay | Admitting: *Deleted

## 2012-02-23 ENCOUNTER — Other Ambulatory Visit: Payer: Self-pay | Admitting: *Deleted

## 2012-02-23 ENCOUNTER — Encounter: Payer: Self-pay | Admitting: *Deleted

## 2012-02-23 ENCOUNTER — Ambulatory Visit (INDEPENDENT_AMBULATORY_CARE_PROVIDER_SITE_OTHER): Payer: Medicare Other | Admitting: Internal Medicine

## 2012-02-23 VITALS — BP 130/62 | HR 54 | Resp 18 | Ht 62.0 in | Wt 161.8 lb

## 2012-02-23 DIAGNOSIS — R001 Bradycardia, unspecified: Secondary | ICD-10-CM

## 2012-02-23 DIAGNOSIS — I498 Other specified cardiac arrhythmias: Secondary | ICD-10-CM

## 2012-02-23 DIAGNOSIS — I2510 Atherosclerotic heart disease of native coronary artery without angina pectoris: Secondary | ICD-10-CM

## 2012-02-23 DIAGNOSIS — I4891 Unspecified atrial fibrillation: Secondary | ICD-10-CM

## 2012-02-23 DIAGNOSIS — I495 Sick sinus syndrome: Secondary | ICD-10-CM

## 2012-02-23 LAB — BASIC METABOLIC PANEL
CO2: 27 mEq/L (ref 19–32)
Calcium: 10.3 mg/dL (ref 8.4–10.5)
GFR: 64.89 mL/min (ref >60.00)
Potassium: 4.7 mEq/L (ref 3.5–5.1)
Sodium: 135 mEq/L (ref 135–145)

## 2012-02-23 LAB — CBC WITH DIFFERENTIAL/PLATELET
Basophils Relative: 0.3 % (ref 0.0–3.0)
Eosinophils Relative: 2.2 % (ref 0.0–5.0)
HCT: 36.6 % (ref 36.0–46.0)
Hemoglobin: 11.7 g/dL — ABNORMAL LOW (ref 12.0–15.0)
Lymphocytes Relative: 16.8 % (ref 12.0–46.0)
Monocytes Relative: 8.3 % (ref 3.0–12.0)
Neutro Abs: 5.6 10*3/uL (ref 1.4–7.7)
RBC: 4.56 Mil/uL (ref 3.87–5.11)

## 2012-02-23 NOTE — Telephone Encounter (Signed)
Pt appt has been rescheduled pt aware

## 2012-02-23 NOTE — Patient Instructions (Addendum)

## 2012-02-25 ENCOUNTER — Ambulatory Visit: Payer: Medicare Other | Admitting: Gastroenterology

## 2012-02-28 ENCOUNTER — Encounter: Payer: Self-pay | Admitting: Internal Medicine

## 2012-02-28 MED ORDER — SODIUM CHLORIDE 0.9 % IR SOLN
80.0000 mg | Status: DC
  Filled 2012-02-28: qty 2

## 2012-02-28 MED ORDER — CEFAZOLIN SODIUM-DEXTROSE 2-3 GM-% IV SOLR
2.0000 g | INTRAVENOUS | Status: DC
  Filled 2012-02-28 (×2): qty 50

## 2012-02-28 NOTE — Assessment & Plan Note (Signed)
The patient has profound and symptomatic bradycardia.  I have discussed her case with Dr Antoine Poche and also reviewed her event monitor from 8/13.  This documents sinus bradycardia with junctional rhythm and heart rates in the 30s.  She has also had pauses of > 3 seconds.  She denies syncope but reports dizziness and presyncope today.  Dr Antoine Poche feels that she will need beta blockers long term given her h/o CAD as well as rate control for her afib for which she is also tachycardic and symptomatic at times. The patient has clear tachycardia/bradycardia syndrome.  I would therefore recommend pacemaker implantation at this time.  Risks, benefits, alternatives to pacemaker implantation were discussed in detail with the patient today. The patient understands that the risks include but are not limited to bleeding, infection, pneumothorax, perforation, tamponade, vascular damage, renal failure, MI, stroke, death,  and lead dislodgement and wishes to proceed. I had planned to proceed with PPM implant today.  Unfortunately, her INR is 4.6.  I therefore will stop metoprolol and coumadin and have her return for an elective pacemaker early next week.  She is advised that should her bradycardia symptoms worsen or she have further presyncope/syncope that she would report to the ER for more urgent PPM implant.  The patient reports that she is comfortable with this plan.

## 2012-02-28 NOTE — Assessment & Plan Note (Signed)
She is in sinus rhythm today. She will likely require drug therapy long term. Hold metoprolol and coumadin as above and restart following her pacemaker implantation.

## 2012-02-28 NOTE — Assessment & Plan Note (Signed)
No ischemic symptoms Restart beta blocker therapy after her pacemaker is implanted

## 2012-02-28 NOTE — Progress Notes (Signed)
  PCP:  MOORE, DONALD, MD Primary Cardiologist:  Dr Hochrein  The patient presents today for electrophysiology evaluation per Dr Hochrein's request. I have seen her previously when hospitalized 7/13 (my note is reviewed today).   Since her discharge, she has been limited by progressive bradycardia.  She was evaluated by Dr Hochrein and amiodarone discontinued.  She reports symptoms of fatigue and dizziness with her afib.  She also reports tachypalpitations and SOB when in afib.  Her metoprolol was initially discontinued but had to be restarted due to episodes of afib with elevated ventricular rates.  She was therefore place back on metoprolol 25mg BID. Since that time, she has not done well.  She reports severe fatigue and frequent dizziness with presyncope.  Today in the office she states that "I feel like I might pass out". Today, she denies symptoms of chest pain, shortness of breath (above baseline), orthopnea, PND, lower extremity edema, or neurologic sequela.   Past Medical History  Diagnosis Date  . Hypertension     with h/o hypertensive urgency  . Glaucoma   . Tremor, essential   . Diabetes mellitus with neuropathy   . B12 deficiency   . Hyperlipidemia   . Atrial fibrillation     a. on chronic anticoagulation  b. Tiksosyn discontinued and amiodarone initiated 11/2011,  . Chronic diastolic heart failure     echo 05/2009: mild LVH, EF 55-60%, grade 2 diast dysfxn, mild LAE, PASP  . CAD (coronary artery disease)     a.  LHC 12/27/11: mLAD 30%, prox-mid D1 90% (small);  CFX patent stent, OM3 small 95%,, mRCA 30%, EF 65%  -  med Rx; b. 07/2011 NSTEMI - PCI/DES LCX - Resolute stent;  . Carotid stenosis     dopplers 6/12: 1-39% bilat ICA  . Pulmonary fibrosis     Dr. Alva;  patient taken off O2 in 8/12; dyspnea felt CHF>>ILD  . Skin cancer   . Warfarin anticoagulation   . Hypomagnesemia   . Colon polyps   . Syncope     unknown etiology  . Diabetes mellitus   . Bradycardia    Past  Surgical History  Procedure Date  . Appendectomy   . Nose surgery   . Colonoscopy   . Cardiac catheterization 2013    stent    Current Outpatient Prescriptions  Medication Sig Dispense Refill  . amLODipine (NORVASC) 5 MG tablet Take 1 tablet (5 mg total) by mouth daily.  30 tablet  3  . Cholecalciferol (VITAMIN D) 1000 UNITS capsule Take 1,000 Units by mouth at bedtime. OTC      . clopidogrel (PLAVIX) 75 MG tablet Take 75 mg by mouth daily.      . colesevelam (WELCHOL) 625 MG tablet Take 625 mg by mouth at bedtime.       . Cyanocobalamin (VITAMIN B-12 IJ) Inject 1 application as directed every 30 (thirty) days. Given at the doctor's office at the end of each month      . fish oil-omega-3 fatty acids 1000 MG capsule Take 1 g by mouth at bedtime.       . furosemide (LASIX) 20 MG tablet Take 1 tablet (20 mg total) by mouth every morning.  30 tablet  6  . insulin aspart (NOVOLOG FLEXPEN) 100 UNIT/ML injection Inject 10 Units into the skin 3 (three) times daily before meals.      . insulin detemir (LEVEMIR) 100 UNIT/ML injection Inject 20 Units into the skin every morning.      .   isosorbide mononitrate (IMDUR) 60 MG 24 hr tablet Take 60 mg by mouth every morning.       . latanoprost (XALATAN) 0.005 % ophthalmic solution Place 1 drop into both eyes at bedtime.       . magnesium oxide (MAG-OX) 400 MG tablet Take 400 mg by mouth 3 (three) times daily.      . metFORMIN (GLUCOPHAGE) 1000 MG tablet Take 1,000 mg by mouth 2 (two) times daily with a meal.      . metoprolol tartrate (LOPRESSOR) 25 MG tablet Take 1 tablet (25 mg total) by mouth 2 (two) times daily.  60 tablet  11  . nitroGLYCERIN (NITROSTAT) 0.4 MG SL tablet Place 0.4 mg under the tongue every 5 (five) minutes as needed. As needed for chest pain      . potassium chloride SA (K-DUR,KLOR-CON) 20 MEQ tablet Take 20 mEq by mouth every morning.      . warfarin (COUMADIN) 5 MG tablet Take 1-2 tablets (5-10 mg total) by mouth daily. 2 tablets  Monday, Wednesday and Friday and 1 tablet all other days.      . DISCONTD: diltiazem (TIAZAC) 420 MG 24 hr capsule Take 420 mg by mouth daily.        Allergies  Allergen Reactions  . Ace Inhibitors Cough    Enalapril  benicor    . Crestor (Rosuvastatin Calcium)     Fatigue and leg pain   . Lipitor (Atorvastatin Calcium)     Myalgias   . Niaspan (Niacin)     Intol   . Olmesartan Cough  . Prednisone     REACTION: "retained fluid"    History   Social History  . Marital Status: Widowed    Spouse Name: N/A    Number of Children: 1  . Years of Education: N/A   Occupational History  . retired    Social History Main Topics  . Smoking status: Never Smoker   . Smokeless tobacco: Never Used  . Alcohol Use: No  . Drug Use: No  . Sexually Active: No   Other Topics Concern  . Not on file   Social History Narrative   Widow, lives alone at home.    Family History  Problem Relation Age of Onset  . Throat cancer Brother   . Prostate cancer Father   . Diabetes Mother   . Diabetes Brother     multiple  . Heart disease Brother   . Colon cancer Neg Hx     ROS-  All systems are reviewed and are negative except as outlined in the HPI above   Physical Exam: Filed Vitals:   02/23/12 1046  BP: 130/62  Pulse: 54  Resp: 18  Height: 5' 2" (1.575 m)  Weight: 161 lb 12.8 oz (73.392 kg)  SpO2: 92%    GEN- The patient is elderly appearing, alert and oriented x 3 today.   Head- normocephalic, atraumatic Eyes-  Sclera clear, conjunctiva pink Ears- hearing intact Oropharynx- clear Neck- supple, no JVP Lymph- no cervical lymphadenopathy Lungs- Clear to ausculation bilaterally, normal work of breathing Heart-  Very brady regular rhythm today GI- soft, NT, ND, + BS Extremities- no clubbing, cyanosis, or edema MS- age appropriate muscle atrophy, she is in a wheelchair today Skin- no rash or lesion Psych- euthymic mood, full affect Neuro- strength and sensation are  intact  Echo 3/13 reviewed and reveals normal EF with very mild AS ekg today reveals sinus bradycardia 28 bpm, poor R wave progression,   nonspecific ST/T changes Qtc 374  INR today is 4.6  Assessment and Plan:  

## 2012-02-29 ENCOUNTER — Encounter (HOSPITAL_COMMUNITY): Payer: Self-pay | Admitting: General Practice

## 2012-02-29 ENCOUNTER — Encounter (HOSPITAL_COMMUNITY)
Admission: RE | Disposition: A | Discharge status: Home / Self Care | Payer: Self-pay | Source: Ambulatory Visit | Attending: Internal Medicine

## 2012-02-29 ENCOUNTER — Ambulatory Visit (HOSPITAL_COMMUNITY)
Admission: RE | Admit: 2012-02-29 | Discharge: 2012-03-01 | Disposition: A | Discharge status: Home / Self Care | Payer: Medicare Other | Source: Ambulatory Visit | Attending: Internal Medicine | Admitting: Internal Medicine

## 2012-02-29 DIAGNOSIS — Z7901 Long term (current) use of anticoagulants: Secondary | ICD-10-CM

## 2012-02-29 DIAGNOSIS — Z860100 Personal history of colon polyps, unspecified: Secondary | ICD-10-CM

## 2012-02-29 DIAGNOSIS — I495 Sick sinus syndrome: Secondary | ICD-10-CM

## 2012-02-29 DIAGNOSIS — R0989 Other specified symptoms and signs involving the circulatory and respiratory systems: Secondary | ICD-10-CM

## 2012-02-29 DIAGNOSIS — I6529 Occlusion and stenosis of unspecified carotid artery: Secondary | ICD-10-CM

## 2012-02-29 DIAGNOSIS — N390 Urinary tract infection, site not specified: Secondary | ICD-10-CM

## 2012-02-29 DIAGNOSIS — E119 Type 2 diabetes mellitus without complications: Secondary | ICD-10-CM

## 2012-02-29 DIAGNOSIS — R531 Weakness: Secondary | ICD-10-CM

## 2012-02-29 DIAGNOSIS — E669 Obesity, unspecified: Secondary | ICD-10-CM

## 2012-02-29 DIAGNOSIS — I4891 Unspecified atrial fibrillation: Secondary | ICD-10-CM

## 2012-02-29 DIAGNOSIS — I5033 Acute on chronic diastolic (congestive) heart failure: Secondary | ICD-10-CM

## 2012-02-29 DIAGNOSIS — R079 Chest pain, unspecified: Secondary | ICD-10-CM

## 2012-02-29 DIAGNOSIS — R55 Syncope and collapse: Secondary | ICD-10-CM

## 2012-02-29 DIAGNOSIS — I509 Heart failure, unspecified: Secondary | ICD-10-CM

## 2012-02-29 DIAGNOSIS — E538 Deficiency of other specified B group vitamins: Secondary | ICD-10-CM | POA: Insufficient documentation

## 2012-02-29 DIAGNOSIS — D72829 Elevated white blood cell count, unspecified: Secondary | ICD-10-CM

## 2012-02-29 DIAGNOSIS — I491 Atrial premature depolarization: Secondary | ICD-10-CM

## 2012-02-29 DIAGNOSIS — Z950 Presence of cardiac pacemaker: Secondary | ICD-10-CM

## 2012-02-29 DIAGNOSIS — R609 Edema, unspecified: Secondary | ICD-10-CM

## 2012-02-29 DIAGNOSIS — E785 Hyperlipidemia, unspecified: Secondary | ICD-10-CM

## 2012-02-29 DIAGNOSIS — I2510 Atherosclerotic heart disease of native coronary artery without angina pectoris: Secondary | ICD-10-CM

## 2012-02-29 DIAGNOSIS — E1142 Type 2 diabetes mellitus with diabetic polyneuropathy: Secondary | ICD-10-CM | POA: Insufficient documentation

## 2012-02-29 DIAGNOSIS — I498 Other specified cardiac arrhythmias: Secondary | ICD-10-CM

## 2012-02-29 DIAGNOSIS — I5032 Chronic diastolic (congestive) heart failure: Secondary | ICD-10-CM

## 2012-02-29 DIAGNOSIS — Z23 Encounter for immunization: Secondary | ICD-10-CM | POA: Insufficient documentation

## 2012-02-29 DIAGNOSIS — I214 Non-ST elevation (NSTEMI) myocardial infarction: Secondary | ICD-10-CM

## 2012-02-29 DIAGNOSIS — R001 Bradycardia, unspecified: Secondary | ICD-10-CM

## 2012-02-29 DIAGNOSIS — E1149 Type 2 diabetes mellitus with other diabetic neurological complication: Secondary | ICD-10-CM | POA: Insufficient documentation

## 2012-02-29 DIAGNOSIS — I10 Essential (primary) hypertension: Secondary | ICD-10-CM

## 2012-02-29 DIAGNOSIS — E875 Hyperkalemia: Secondary | ICD-10-CM

## 2012-02-29 DIAGNOSIS — J8410 Pulmonary fibrosis, unspecified: Secondary | ICD-10-CM | POA: Insufficient documentation

## 2012-02-29 DIAGNOSIS — R0602 Shortness of breath: Secondary | ICD-10-CM

## 2012-02-29 HISTORY — DX: Presence of cardiac pacemaker: Z95.0

## 2012-02-29 HISTORY — PX: PERMANENT PACEMAKER INSERTION: SHX5480

## 2012-02-29 HISTORY — PX: INSERT / REPLACE / REMOVE PACEMAKER: SUR710

## 2012-02-29 LAB — SURGICAL PCR SCREEN: Staphylococcus aureus: NEGATIVE

## 2012-02-29 LAB — GLUCOSE, CAPILLARY
Glucose-Capillary: 193 mg/dL — ABNORMAL HIGH (ref 70–99)
Glucose-Capillary: 187 mg/dL — ABNORMAL HIGH (ref 70–99)
Glucose-Capillary: 222 mg/dL — ABNORMAL HIGH (ref 70–99)

## 2012-02-29 SURGERY — PERMANENT PACEMAKER INSERTION
Anesthesia: LOCAL

## 2012-02-29 MED ORDER — CLOPIDOGREL BISULFATE 75 MG PO TABS
75.0000 mg | ORAL_TABLET | Freq: Every day | ORAL | Status: DC
  Administered 2012-03-01: 11:00:00 75 mg via ORAL
  Filled 2012-02-29: qty 1

## 2012-02-29 MED ORDER — WARFARIN - PHYSICIAN DOSING INPATIENT: Freq: Every day | Status: DC

## 2012-02-29 MED ORDER — SODIUM CHLORIDE 0.9 % IV SOLN
250.0000 mL | INTRAVENOUS | Status: DC
  Administered 2012-02-29: 1000 mL via INTRAVENOUS

## 2012-02-29 MED ORDER — WARFARIN SODIUM 10 MG PO TABS
10.0000 mg | ORAL_TABLET | ORAL | Status: DC
  Filled 2012-02-29: qty 1

## 2012-02-29 MED ORDER — ISOSORBIDE MONONITRATE ER 60 MG PO TB24
60.0000 mg | ORAL_TABLET | Freq: Every morning | ORAL | Status: DC
  Administered 2012-02-29 – 2012-03-01 (×2): 60 mg via ORAL
  Filled 2012-02-29 (×2): qty 1

## 2012-02-29 MED ORDER — LATANOPROST 0.005 % OP SOLN
1.0000 [drp] | Freq: Every day | OPHTHALMIC | Status: DC
  Administered 2012-02-29: 1 [drp] via OPHTHALMIC
  Filled 2012-02-29 (×2): qty 2.5

## 2012-02-29 MED ORDER — POTASSIUM CHLORIDE CRYS ER 20 MEQ PO TBCR
20.0000 meq | EXTENDED_RELEASE_TABLET | Freq: Every morning | ORAL | Status: DC
  Administered 2012-03-01: 11:00:00 20 meq via ORAL
  Filled 2012-02-29: qty 1

## 2012-02-29 MED ORDER — CEFAZOLIN SODIUM 1-5 GM-% IV SOLN
1.0000 g | Freq: Four times a day (QID) | INTRAVENOUS | Status: AC
  Administered 2012-02-29 – 2012-03-01 (×3): 1 g via INTRAVENOUS
  Filled 2012-02-29 (×4): qty 50

## 2012-02-29 MED ORDER — MIDAZOLAM HCL 5 MG/5ML IJ SOLN
INTRAMUSCULAR | Status: AC
  Filled 2012-02-29: qty 5

## 2012-02-29 MED ORDER — AMLODIPINE BESYLATE 5 MG PO TABS
5.0000 mg | ORAL_TABLET | Freq: Every day | ORAL | Status: DC
  Administered 2012-02-29 – 2012-03-01 (×2): 5 mg via ORAL
  Filled 2012-02-29 (×2): qty 1

## 2012-02-29 MED ORDER — HYDROCODONE-ACETAMINOPHEN 5-325 MG PO TABS: 1.0000 | ORAL_TABLET | ORAL | Status: DC | PRN

## 2012-02-29 MED ORDER — INSULIN ASPART 100 UNIT/ML ~~LOC~~ SOLN
10.0000 [IU] | Freq: Three times a day (TID) | SUBCUTANEOUS | Status: DC
  Administered 2012-02-29 – 2012-03-01 (×2): 10 [IU] via SUBCUTANEOUS

## 2012-02-29 MED ORDER — METOPROLOL TARTRATE 25 MG PO TABS
25.0000 mg | ORAL_TABLET | Freq: Two times a day (BID) | ORAL | Status: DC
Start: 2012-02-29 — End: 2012-03-01
  Administered 2012-02-29 – 2012-03-01 (×2): 25 mg via ORAL
  Filled 2012-02-29 (×3): qty 1

## 2012-02-29 MED ORDER — MUPIROCIN 2 % EX OINT
TOPICAL_OINTMENT | Freq: Two times a day (BID) | CUTANEOUS | Status: DC
  Administered 2012-02-29: 1 via NASAL
  Filled 2012-02-29 (×2): qty 22

## 2012-02-29 MED ORDER — SODIUM CHLORIDE 0.9 % IV SOLN: 250.0000 mL | INTRAVENOUS | Status: DC | PRN

## 2012-02-29 MED ORDER — PNEUMOCOCCAL VAC POLYVALENT 25 MCG/0.5ML IJ INJ
0.5000 mL | INJECTION | Freq: Once | INTRAMUSCULAR | Status: AC
  Administered 2012-03-01: 0.5 mL via INTRAMUSCULAR
  Filled 2012-02-29 (×2): qty 0.5

## 2012-02-29 MED ORDER — WARFARIN SODIUM 5 MG PO TABS
5.0000 mg | ORAL_TABLET | ORAL | Status: DC
  Administered 2012-02-29: 5 mg via ORAL
  Filled 2012-02-29: qty 1

## 2012-02-29 MED ORDER — FENTANYL CITRATE 0.05 MG/ML IJ SOLN
INTRAMUSCULAR | Status: AC
  Filled 2012-02-29: qty 2

## 2012-02-29 MED ORDER — SODIUM CHLORIDE 0.45 % IV SOLN
INTRAVENOUS | Status: DC
  Administered 2012-02-29: 1000 mL via INTRAVENOUS

## 2012-02-29 MED ORDER — ONDANSETRON HCL 4 MG/2ML IJ SOLN: 4.0000 mg | Freq: Four times a day (QID) | INTRAMUSCULAR | Status: DC | PRN

## 2012-02-29 MED ORDER — SODIUM CHLORIDE 0.9 % IJ SOLN: 3.0000 mL | INTRAMUSCULAR | Status: DC | PRN

## 2012-02-29 MED ORDER — WARFARIN SODIUM 5 MG PO TABS: 5.0000 mg | ORAL_TABLET | Freq: Every day | ORAL | Status: DC

## 2012-02-29 MED ORDER — ACETAMINOPHEN 325 MG PO TABS
325.0000 mg | ORAL_TABLET | ORAL | Status: DC | PRN
  Administered 2012-02-29: 14:00:00 650 mg via ORAL
  Filled 2012-02-29: qty 2

## 2012-02-29 MED ORDER — SODIUM CHLORIDE 0.9 % IJ SOLN: 3.0000 mL | Freq: Two times a day (BID) | INTRAMUSCULAR | Status: DC

## 2012-02-29 MED ORDER — SODIUM CHLORIDE 0.9 % IJ SOLN
3.0000 mL | Freq: Two times a day (BID) | INTRAMUSCULAR | Status: DC
  Administered 2012-02-29: 3 mL via INTRAVENOUS

## 2012-02-29 MED ORDER — INSULIN DETEMIR 100 UNIT/ML ~~LOC~~ SOLN
20.0000 [IU] | Freq: Every morning | SUBCUTANEOUS | Status: DC
  Administered 2012-03-01: 11:00:00 20 [IU] via SUBCUTANEOUS
  Filled 2012-02-29: qty 10

## 2012-02-29 NOTE — Plan of Care (Signed)
Problem: Consults Goal: Permanent Pacemaker Patient Education (See Patient Education module for education specifics.) Outcome: Completed/Met Date Met:  02/29/12 Viewed video and read handout from Dr. Jenel Lucks office. Verbalized good understanding and denied any questions  Problem: Phase III Progression Outcomes Goal: Pain controlled on oral analgesia Outcome: Completed/Met Date Met:  02/29/12 Tylenol resolved soreness and achyness experienced after procedure. No further complaints of discomfort Goal: Tolerating diet Outcome: Completed/Met Date Met:  02/29/12 Tolerated lunch and dinner, ate 100% Goal: Hemodynamically stable Outcome: Completed/Met Date Met:  02/29/12 Patients blood pressure lower after daily antihypertensives given. Goal: Ambulating in room or hall Outcome: Completed/Met Date Met:  02/29/12 Ambulating to bathroom, tolerated well. Goal: Limited arm movement per orders Outcome: Completed/Met Date Met:  02/29/12 Arm remains at side, sling applied, patient verbalized need to immobilize arm at this time Goal: Discharge plan remains appropriate-arrangements made Outcome: Completed/Met Date Met:  02/29/12 Planned discharge tommorrow

## 2012-02-29 NOTE — H&P (View-Only) (Signed)
PCP:  [name], MD Primary Cardiologist:  Dr Antoine Poche  The patient presents today for electrophysiology evaluation per Dr Hochrein's request. I have seen her previously when hospitalized 7/13 (my note is reviewed today).   Since her discharge, she has been limited by progressive bradycardia.  She was evaluated by Dr Antoine Poche and amiodarone discontinued.  She reports symptoms of fatigue and dizziness with her afib.  She also reports tachypalpitations and SOB when in afib.  Her metoprolol was initially discontinued but had to be restarted due to episodes of afib with elevated ventricular rates.  She was therefore place back on metoprolol 25mg  BID. Since that time, she has not done well.  She reports severe fatigue and frequent dizziness with presyncope.  Today in the office she states that "I feel like I might pass out". Today, she denies symptoms of chest pain, shortness of breath (above baseline), orthopnea, PND, lower extremity edema, or neurologic sequela.   Past Medical History  Diagnosis Date  . Hypertension     with h/o hypertensive urgency  . Glaucoma   . Tremor, essential   . Diabetes mellitus with neuropathy   . B12 deficiency   . Hyperlipidemia   . Atrial fibrillation     a. on chronic anticoagulation  b. Tiksosyn discontinued and amiodarone initiated 11/2011,  . Chronic diastolic heart failure     echo 05/2009: mild LVH, EF 55-60%, grade 2 diast dysfxn, mild LAE, PASP  . CAD (coronary artery disease)     a.  LHC 12/27/11: mLAD 30%, prox-mid D1 90% (small);  CFX patent stent, OM3 small 95%,, mRCA 30%, EF 65%  -  med Rx; b. 07/2011 NSTEMI - PCI/DES LCX - Resolute stent;  . Carotid stenosis     dopplers 6/12: 1-39% bilat ICA  . Pulmonary fibrosis     Dr. Vassie Loll;  patient taken off O2 in 8/12; dyspnea felt CHF>>ILD  . Skin cancer   . Warfarin anticoagulation   . Hypomagnesemia   . Colon polyps   . Syncope     unknown etiology  . Diabetes mellitus   . Bradycardia    Past  Surgical History  Procedure Date  . Appendectomy   . Nose surgery   . Colonoscopy   . Cardiac catheterization 2013    stent    Current Outpatient Prescriptions  Medication Sig Dispense Refill  . amLODipine (NORVASC) 5 MG tablet Take 1 tablet (5 mg total) by mouth daily.  30 tablet  3  . Cholecalciferol (VITAMIN D) 1000 UNITS capsule Take 1,000 Units by mouth at bedtime. OTC      . clopidogrel (PLAVIX) 75 MG tablet Take 75 mg by mouth daily.      . colesevelam (WELCHOL) 625 MG tablet Take 625 mg by mouth at bedtime.       . Cyanocobalamin (VITAMIN B-12 IJ) Inject 1 application as directed every 30 (thirty) days. Given at the doctor's office at the end of each month      . fish oil-omega-3 fatty acids 1000 MG capsule Take 1 g by mouth at bedtime.       . furosemide (LASIX) 20 MG tablet Take 1 tablet (20 mg total) by mouth every morning.  30 tablet  6  . insulin aspart (NOVOLOG FLEXPEN) 100 UNIT/ML injection Inject 10 Units into the skin 3 (three) times daily before meals.      . insulin detemir (LEVEMIR) 100 UNIT/ML injection Inject 20 Units into the skin every morning.      Marland Kitchen  isosorbide mononitrate (IMDUR) 60 MG 24 hr tablet Take 60 mg by mouth every morning.       . latanoprost (XALATAN) 0.005 % ophthalmic solution Place 1 drop into both eyes at bedtime.       . magnesium oxide (MAG-OX) 400 MG tablet Take 400 mg by mouth 3 (three) times daily.      . metFORMIN (GLUCOPHAGE) 1000 MG tablet Take 1,000 mg by mouth 2 (two) times daily with a meal.      . metoprolol tartrate (LOPRESSOR) 25 MG tablet Take 1 tablet (25 mg total) by mouth 2 (two) times daily.  60 tablet  11  . nitroGLYCERIN (NITROSTAT) 0.4 MG SL tablet Place 0.4 mg under the tongue every 5 (five) minutes as needed. As needed for chest pain      . potassium chloride SA (K-DUR,KLOR-CON) 20 MEQ tablet Take 20 mEq by mouth every morning.      . warfarin (COUMADIN) 5 MG tablet Take 1-2 tablets (5-10 mg total) by mouth daily. 2 tablets  Monday, Wednesday and Friday and 1 tablet all other days.      Marland Kitchen DISCONTD: diltiazem (TIAZAC) 420 MG 24 hr capsule Take 420 mg by mouth daily.        Allergies  Allergen Reactions  . Ace Inhibitors Cough    Enalapril  benicor    . Crestor (Rosuvastatin Calcium)     Fatigue and leg pain   . Lipitor (Atorvastatin Calcium)     Myalgias   . Niaspan (Niacin)     Intol   . Olmesartan Cough  . Prednisone     REACTION: "retained fluid"    History   Social History  . Marital Status: Widowed    Spouse Name: N/A    Number of Children: 1  . Years of Education: N/A   Occupational History  . retired    Social History Main Topics  . Smoking status: Never Smoker   . Smokeless tobacco: Never Used  . Alcohol Use: No  . Drug Use: No  . Sexually Active: No   Other Topics Concern  . Not on file   Social History Narrative   Widow, lives alone at home.    Family History  Problem Relation Age of Onset  . Throat cancer Brother   . Prostate cancer Father   . Diabetes Mother   . Diabetes Brother     multiple  . Heart disease Brother   . Colon cancer Neg Hx     ROS-  All systems are reviewed and are negative except as outlined in the HPI above   Physical Exam: Filed Vitals:   02/23/12 1046  BP: 130/62  Pulse: 54  Resp: 18  Height: 5\' 2"  (1.575 m)  Weight: 161 lb 12.8 oz (73.392 kg)  SpO2: 92%    GEN- The patient is elderly appearing, alert and oriented x 3 today.   Head- normocephalic, atraumatic Eyes-  Sclera clear, conjunctiva pink Ears- hearing intact Oropharynx- clear Neck- supple, no JVP Lymph- no cervical lymphadenopathy Lungs- Clear to ausculation bilaterally, normal work of breathing Heart-  Very brady regular rhythm today GI- soft, NT, ND, + BS Extremities- no clubbing, cyanosis, or edema MS- age appropriate muscle atrophy, she is in a wheelchair today Skin- no rash or lesion Psych- euthymic mood, full affect Neuro- strength and sensation are  intact  Echo 3/13 reviewed and reveals normal EF with very mild AS ekg today reveals sinus bradycardia 28 bpm, poor R wave progression,  nonspecific ST/T changes Qtc 374  INR today is 4.6  Assessment and Plan:

## 2012-02-29 NOTE — Op Note (Signed)
SURGEON:  Hillis Range, MD     PREPROCEDURE DIAGNOSIS:  Symptomatic Bradycardia    POSTPROCEDURE DIAGNOSIS:  Symptomatic Bradycardia     PROCEDURES:   1. Left upper extremity venography.   2. Pacemaker implantation.     INTRODUCTION: Alexandria [name] is a 78 y.o. female  with a history of bradycardia who presents today for pacemaker implantation.  The patient reports intermittent episodes of dizziness over the past few months.  She has a h/o afib and is felt to require beta blocker therapy long term.  Despite holding metoprolol over the past 3 days, her symptoms persist.  No reversible causes have been identified.  The patient therefore presents today for pacemaker implantation.     DESCRIPTION OF PROCEDURE:  Informed written consent was obtained, and  the patient was brought to the electrophysiology lab in a fasting state.  The patient received IV Versed and Fentanyl as sedation for the procedure today.  The patients left chest was prepped and draped in the usual sterile fashion by the EP lab staff. The skin overlying the left deltopectoral region was infiltrated with lidocaine for local analgesia.  A 4-cm incision was made over the left deltopectoral region.  A left subcutaneous pacemaker pocket was fashioned using a combination of sharp and blunt dissection. Electrocautery was required to assure hemostasis.    Left Upper Extremity Venography: A venogram of the left upper extremity was performed, which revealed a large left axillary vein, which emptied into a large left subclavian vein.     RA/RV Lead Placement: The left axillary vein was therefore cannulated.  Through the left axillary vein, a Medtronic model [id number] (serial number [id number]) right atrial lead and a Medtronic model 4092- 58 (serial number [id number]) right ventricular lead were advanced with fluoroscopic visualization into the right atrial appendage and right ventricular apex positions respectively.  Initial atrial lead P- waves  measured 3.3 mV with impedance of 1065 ohms and a threshold of 1.5 V at 0.5 msec.  Right ventricular lead R-waves measured 6.6 mV with an impedance of 669 ohms and a threshold of 0.3 V at 0.5 msec.  Both leads   were secured to the pectoralis fascia using #2-0 silk over the suture sleeves.   Device Placement:  The leads were then connected to a Medtronic Adapta L model ADDRL 1 (serial number [id number]) pacemaker.  The pocket was irrigated with copious gentamicin solution.  The pacemaker was then placed into the pocket.  The pocket was then closed in 2 layers with 2.0 Vicryl suture for the subcutaneous and subcuticular layers.  Steri-Strips and a sterile dressing were then applied.  There were no early apparent complications.     CONCLUSIONS:   1. Successful implantation of a Medtronic Adapta L dual-chamber pacemaker for symptomatic bradycardia  2. No early apparent complications.           Hillis Range, MD 02/29/2012 11:10 AM

## 2012-02-29 NOTE — Interval H&P Note (Signed)
History and Physical Interval Note:  02/29/2012 9:50 AM  Alexandria [name]  has presented today for surgery, with the diagnosis of Heart block  The various methods of treatment have been discussed with the patient and family. After consideration of risks, benefits and other options for treatment, the patient has consented to  Procedure(s) (LRB): PERMANENT PACEMAKER INSERTION (N/A) as a surgical intervention .  The patient's history has been reviewed, patient examined, no change in status, stable for surgery.  I have reviewed the patient's chart and labs.  Questions were answered to the patient's satisfaction.     Hillis Range

## 2012-02-29 NOTE — Progress Notes (Signed)
ANTICOAGULATION CONSULT NOTE - Initial Consult  Pharmacy Consult for Coumadin Indication: atrial fibrillation  Allergies  Allergen Reactions  . Ace Inhibitors Cough    Enalapril  benicor    . Crestor (Rosuvastatin Calcium)     Fatigue and leg pain   . Lipitor (Atorvastatin Calcium)     Myalgias   . Niaspan (Niacin)     Intol   . Olmesartan Cough  . Prednisone     REACTION: "retained fluid"    Patient Measurements: Height: 5\' 2"  (157.5 cm) Weight: 160 lb (72.576 kg) IBW/kg (Calculated) : 50.1   Vital Signs: Temp: 97.8 F (36.6 C) (09/03 1135) Temp src: Oral (09/03 1135) BP: 156/94 mmHg (09/03 0737) Pulse Rate: 62  (09/03 1135)  Labs:  Basename 02/29/12 0748  HGB --  HCT --  PLT --  APTT --  LABPROT 13.4  INR 1.00  HEPARINUNFRC --  CREATININE --  CKTOTAL --  CKMB --  TROPONINI --    Estimated Creatinine Clearance: 47.3 ml/min (by C-G formula based on Cr of 0.9).   Medical History: Past Medical History  Diagnosis Date  . Hypertension     with h/o hypertensive urgency  . Glaucoma   . Tremor, essential   . Diabetes mellitus with neuropathy   . B12 deficiency   . Hyperlipidemia   . Atrial fibrillation     a. on chronic anticoagulation  b. Tiksosyn discontinued and amiodarone initiated 11/2011,  . Chronic diastolic heart failure     echo 05/2009: mild LVH, EF 55-60%, grade 2 diast dysfxn, mild LAE, PASP  . CAD (coronary artery disease)     a.  LHC 12/27/11: mLAD 30%, prox-mid D1 90% (small);  CFX patent stent, OM3 small 95%,, mRCA 30%, EF 65%  -  med Rx; b. 07/2011 NSTEMI - PCI/DES LCX - Resolute stent;  . Carotid stenosis     dopplers 6/12: 1-39% bilat ICA  . Pulmonary fibrosis     Dr. Vassie Loll;  patient taken off O2 in 8/12; dyspnea felt CHF>>ILD  . Skin cancer   . Warfarin anticoagulation   . Hypomagnesemia   . Colon polyps   . Syncope     unknown etiology  . Diabetes mellitus   . Bradycardia     Medications:  Prescriptions prior to admission    Medication Sig Dispense Refill  . amLODipine (NORVASC) 5 MG tablet Take 1 tablet (5 mg total) by mouth daily.  30 tablet  3  . Cholecalciferol (VITAMIN D) 1000 UNITS capsule Take 1,000 Units by mouth at bedtime. OTC      . clopidogrel (PLAVIX) 75 MG tablet Take 75 mg by mouth daily.      . colesevelam (WELCHOL) 625 MG tablet Take 625 mg by mouth at bedtime.       . Cyanocobalamin (VITAMIN B-12 IJ) Inject 1 application as directed every 30 (thirty) days. Given at the doctor's office at the end of each month      . fish oil-omega-3 fatty acids 1000 MG capsule Take 1 g by mouth at bedtime.       . furosemide (LASIX) 20 MG tablet Take 1 tablet (20 mg total) by mouth every morning.  30 tablet  6  . insulin aspart (NOVOLOG FLEXPEN) 100 UNIT/ML injection Inject 10 Units into the skin 3 (three) times daily before meals.      . insulin detemir (LEVEMIR) 100 UNIT/ML injection Inject 20 Units into the skin every morning.      . isosorbide  mononitrate (IMDUR) 60 MG 24 hr tablet Take 60 mg by mouth every morning.       . latanoprost (XALATAN) 0.005 % ophthalmic solution Place 1 drop into both eyes at bedtime.       . magnesium oxide (MAG-OX) 400 MG tablet Take 400 mg by mouth 3 (three) times daily.      . metFORMIN (GLUCOPHAGE) 1000 MG tablet Take 1,000 mg by mouth 2 (two) times daily with a meal.      . metoprolol tartrate (LOPRESSOR) 25 MG tablet Take 1 tablet (25 mg total) by mouth 2 (two) times daily.  60 tablet  11  . potassium chloride SA (K-DUR,KLOR-CON) 20 MEQ tablet Take 20 mEq by mouth every morning.      . warfarin (COUMADIN) 5 MG tablet Take 1-2 tablets (5-10 mg total) by mouth daily. 2 tablets Monday, Wednesday and Friday and 1 tablet all other days.      . nitroGLYCERIN (NITROSTAT) 0.4 MG SL tablet Place 0.4 mg under the tongue every 5 (five) minutes as needed. As needed for chest pain        Assessment: 78 yo F admitted 02/29/2012 for planned pacemaker placement.  To restart home Coumadin  dosing for hx Afib.    Home dose = 5mg  daily except 10mg  MWF (last dose 8/28)  Goal of Therapy:  INR 2-3   Plan:  Restart home Coumadin regimen. Daily INR.  [employer], Pharm.D., BCPS Clinical Pharmacist Pager [phone] 02/29/2012 12:46 PM

## 2012-02-29 NOTE — Progress Notes (Signed)
Orthopedic Tech Progress Note Patient Details:  Alexandria [name] 11-26-1929 [id number]  Ortho Devices Type of Ortho Device: Arm foam sling Ortho Device/Splint Location: left arm sling Ortho Device/Splint Interventions: Application   Cammer, Mickie Bail 02/29/2012, 12:38 PM

## 2012-03-01 ENCOUNTER — Encounter (INDEPENDENT_AMBULATORY_CARE_PROVIDER_SITE_OTHER): Payer: Medicare Other | Admitting: Ophthalmology

## 2012-03-01 ENCOUNTER — Encounter: Payer: Self-pay | Admitting: Cardiology

## 2012-03-01 ENCOUNTER — Ambulatory Visit (HOSPITAL_COMMUNITY): Payer: Medicare Other

## 2012-03-01 DIAGNOSIS — I498 Other specified cardiac arrhythmias: Secondary | ICD-10-CM

## 2012-03-01 LAB — PROTIME-INR: Prothrombin Time: 13.7 seconds (ref 11.6–15.2)

## 2012-03-01 MED ORDER — YOU HAVE A PACEMAKER BOOK
Freq: Once | Status: DC
  Filled 2012-03-01 (×2): qty 1

## 2012-03-01 MED ORDER — WARFARIN - PHARMACIST DOSING INPATIENT: Freq: Every day | Status: DC

## 2012-03-01 NOTE — Progress Notes (Signed)
ANTICOAGULATION CONSULT NOTE - Follow Up Consult  Pharmacy Consult: Coumadin Indication:  Atrial fibrillation  Allergies  Allergen Reactions  . Ace Inhibitors Cough    Enalapril  benicor    . Crestor (Rosuvastatin Calcium)     Fatigue and leg pain   . Lipitor (Atorvastatin Calcium)     Myalgias   . Niaspan (Niacin)     Intol   . Olmesartan Cough  . Prednisone     REACTION: "retained fluid"    Patient Measurements: Height: 5\' 2"  (157.5 cm) Weight: 161 lb 2.5 oz (73.1 kg) IBW/kg (Calculated) : 50.1   Vital Signs: Temp: 98.7 F (37.1 C) (09/04 0733) Temp src: Oral (09/04 0733) BP: 112/65 mmHg (09/04 0733) Pulse Rate: 63  (09/04 0733)  Labs:  Basename 03/01/12 0530 02/29/12 0748  HGB -- --  HCT -- --  PLT -- --  APTT -- --  LABPROT 13.7 13.4  INR 1.03 1.00  HEPARINUNFRC -- --  CREATININE -- --  CKTOTAL -- --  CKMB -- --  TROPONINI -- --    Estimated Creatinine Clearance: 47.4 ml/min (by C-G formula based on Cr of 0.9).     Assessment: 78 yo F admitted 02/29/2012 for planned pacemaker placement.  Now s/p PPM placement and Pharmacy consulted to restart home Coumadin dose for history of Afib.  INR currently subtherapeutic, no bleeding reported.    Goal of Therapy:  INR 2-3 Monitor platelets by anticoagulation protocol: Yes    Plan:  - Coumadin 5mg  PO daily except 10mg  on MWF - F/U INR in AM if still here     Arrayah Connors D. Laney Potash, PharmD, BCPS Pager:  [phone] 03/01/2012, 8:43 AM

## 2012-03-01 NOTE — Discharge Summary (Signed)
ELECTROPHYSIOLOGY DISCHARGE SUMMARY    Patient ID: Alexandria [name],  MRN: [id number], DOB/AGE: June 05, 1930 78 y.o.  Admit date: 02/29/2012 Discharge date: 03/02/2012  Primary Care Physician: [name], MD Primary Cardiologist: Antoine Poche, MD Primary EP: Johney Frame, MD  Primary Discharge Diagnosis:  1. Tachy-brady syndrome s/p PPM implantation 2. Atrial fibrillation  Secondary Discharge Diagnoses:  1. CAD 2. HTN 3. Dyslipidemia 4. DM 5. Chronic diastolic HF 6. Pulmonary fibrosis 7. Vitamin B12 deficiency  Procedures This Admission:  1. Dual chamber PPM implantation 02/29/2012 Medtronic model [id number] (serial number [id number]) right atrial lead and a Medtronic model 4092- 58 (serial number [id number]) right ventricular lead were advanced with fluoroscopic visualization into the right atrial appendage and right ventricular apex positions respectively. Medtronic Adapta L model ADDRL 1 (serial number [id number]) pacemaker.  History and Hospital Course:  Ms. Alexandria is a 78 year old woman with a history of bradycardia and atrial fibrillation who was evaluated recently by Dr. Johney Frame. She has had intermittent episodes of dizziness over the past few months with documented bradycardia/junctional rhythm with rates in the 30s and pauses >3 seconds on a recent event monitor. She also has AFib and is felt to require beta blocker therapy long term. Despite holding metoprolol over the past 3 days, her symptoms persist. No reversible causes have been identified. Therefore, Ms. [name] presented 02/29/2012 for pacemaker implantation. She tolerated this procedure well without any immediate complication. She remains hemodynamically stable and afebrile. Her chest xray shows stable lead placement without pneumothorax. Her device interrogation shows normal PPM function with stable lead parameters/measurements. Her implant site is intact without significant bleeding or hematoma. She has been given discharge  instructions including wound care and activity restrictions. She will follow-up in 10 days for wound check. Her metoprolol was resumed following PPM implant; otherwise, there were no changes made to her medications. She has been seen, examined and deemed stable for discharge today by Dr. Hillis Range.   Discharge Vitals: Blood pressure 112/65, pulse 63, temperature 98.7 F (37.1 C), temperature source Oral, resp. rate 21, height 5\' 2"  (1.575 m), weight 161 lb 2.5 oz (73.1 kg), SpO2 95.00%  Labs: Lab Results  Component Value Date   WBC 7.8 02/23/2012   HGB 11.7* 02/23/2012   HCT 36.6 02/23/2012   MCV 80.4 02/23/2012   PLT 207.0 02/23/2012    No results found for this basename: NA,K,CL,CO2,BUN,CREATININE,CALCIUM,LABALBU,PROT,BILITOT,ALKPHOS,ALT,AST,GLUCOSE in the last 168 hours    Basename 03/01/12 0530  INR 1.03    Disposition:  The patient is being discharged in stable condition.  Follow-up: Follow-up Information    Follow up with [facility] on 03/13/2012. (At 11:30 AM for wound check)    Contact information:   [facility] [place] [address] [place], [address] [place] [address] [phone]      Follow up with [name], MD on 04/05/2012. (At 11:45 AM)    Contact information:   [facility] [place] [address] [place], [place] [address] [phone]      Follow up with Hillis Range, MD on 05/31/2012. (At 11:00 AM)    Contact information:   [facility] [place]  [address] [place] [address] [phone]        Discharge Medications:  Medication List  As of 03/02/2012 11:53 AM   TAKE these medications         amLODipine 5 MG tablet   Commonly known as: NORVASC  Take 1 tablet (5 mg total) by mouth daily.      clopidogrel 75 MG tablet   Commonly known as: PLAVIX   Take 75 mg by mouth daily.      colesevelam 625 MG tablet   Commonly known as: WELCHOL   Take 625 mg by  mouth at bedtime.      fish oil-omega-3 fatty acids 1000 MG capsule   Take 1 g by mouth at bedtime.      furosemide 20 MG tablet   Commonly known as: LASIX   Take 1 tablet (20 mg total) by mouth every morning.      insulin detemir 100 UNIT/ML injection   Commonly known as: LEVEMIR   Inject 20 Units into the skin every morning.      isosorbide mononitrate 60 MG 24 hr tablet   Commonly known as: IMDUR   Take 60 mg by mouth every morning.      latanoprost 0.005 % ophthalmic solution   Commonly known as: XALATAN   Place 1 drop into both eyes at bedtime.      magnesium oxide 400 MG tablet   Commonly known as: MAG-OX   Take 400 mg by mouth 3 (three) times daily.      metFORMIN 1000 MG tablet   Commonly known as: GLUCOPHAGE   Take 1,000 mg by mouth 2 (two) times daily with a meal.      metoprolol tartrate 25 MG tablet   Commonly known as: LOPRESSOR   Take 1 tablet (25 mg total) by mouth 2 (two) times daily.      nitroGLYCERIN 0.4 MG SL tablet   Commonly known as: NITROSTAT   Place 0.4 mg under the tongue every 5 (five) minutes as needed. As needed for chest pain      NOVOLOG FLEXPEN 100 UNIT/ML injection   Generic drug: insulin aspart   Inject 10 Units into the skin 3 (three) times daily before meals.      potassium chloride SA 20 MEQ tablet   Commonly known as: K-DUR,KLOR-CON   Take 20 mEq by mouth every morning.      VITAMIN B-12 IJ   Inject 1 application as directed every 30 (thirty) days. Given at the doctor's office at the end of each month      Vitamin D 1000 UNITS capsule   Take 1,000 Units by mouth at bedtime. OTC      warfarin 5 MG tablet   Commonly known as: COUMADIN   Take 1-2 tablets (5-10 mg total) by mouth daily. 2 tablets Monday, Wednesday and Friday and 1 tablet all other days.            Duration of Discharge Encounter: Greater than 30 minutes including physician time.  Signed, Rick Duff, PA-C 03/02/2012, 11:53 AM  I have seen, examined  the patient, and reviewed the above assessment and plan.  Changes to above are made where necessary.    Co Sign: Hillis Range, MD

## 2012-03-01 NOTE — Progress Notes (Signed)
   SUBJECTIVE: The patient is doing well today.  At this time, she denies chest pain, shortness of breath, or any new concerns.  + L shoulder soreness.     Marland Kitchen amLODipine  5 mg Oral Daily  .  ceFAZolin (ANCEF) IV  1 g Intravenous Q6H  . clopidogrel  75 mg Oral Daily  . fentaNYL      . insulin aspart  10 Units Subcutaneous TID AC  . insulin detemir  20 Units Subcutaneous q morning - 10a  . isosorbide mononitrate  60 mg Oral q morning - 10a  . latanoprost  1 drop Both Eyes QHS  . metoprolol tartrate  25 mg Oral BID  . midazolam      . pneumococcal 23 valent vaccine  0.5 mL Intramuscular Once  . potassium chloride SA  20 mEq Oral q morning - 10a  . sodium chloride  3 mL Intravenous Q12H  . warfarin  10 mg Oral Q M,W,F-1800  . warfarin  5 mg Oral Q T,Th,S,Su-1800  . you have a pacemaker book   Does not apply Once  . DISCONTD:  ceFAZolin (ANCEF) IV  2 g Intravenous On Call  . DISCONTD: gentamicin irrigation  80 mg Irrigation On Call  . DISCONTD: mupirocin ointment   Nasal BID  . DISCONTD: sodium chloride  3 mL Intravenous Q12H  . DISCONTD: warfarin  5-10 mg Oral Daily  . DISCONTD: Warfarin - Physician Dosing Inpatient   Does not apply q1800      . DISCONTD: sodium chloride 1,000 mL (02/29/12 0820)  . DISCONTD: sodium chloride 1,000 mL (02/29/12 0821)    OBJECTIVE: Physical Exam: Filed Vitals:   03/01/12 0000 03/01/12 0400 03/01/12 0450 03/01/12 0733  BP:   141/52 112/65  Pulse:   62 63  Temp:   99.1 F (37.3 C) 98.7 F (37.1 C)  TempSrc:   Oral Oral  Resp: 19 25 18 21   Height:      Weight:   161 lb 2.5 oz (73.1 kg)   SpO2:   95% 95%    Intake/Output Summary (Last 24 hours) at 03/01/12 0840 Last data filed at 03/01/12 0734  Gross per 24 hour  Intake    410 ml  Output    450 ml  Net    -40 ml    Telemetry reveals atrial paced rhythm  GEN- The patient is well appearing, alert and oriented x 3 today.   Head- normocephalic, atraumatic Eyes-  Sclera clear,  conjunctiva pink Ears- hearing intact Oropharynx- clear Neck- supple, no JVP Lymph- no cervical lymphadenopathy Lungs- Clear to ausculation bilaterally, normal work of breathing Heart- Regular rate and rhythm, no murmurs, rubs or gallops, PMI not laterally displaced GI- soft, NT, ND, + BS Extremities- no clubbing, cyanosis, or edema Skin-  L chest pacer site is without hematoma  CXR and pacemaker interrogation are both reviewed today  ASSESSMENT AND PLAN:  Active Problems:  A-fib  Bradycardia  1. Tachy/brady syndrome- doing well s/p PPM Interrogation is normal today DC to home with routine wound care and follow-up  2. afib- resume coumadin, restart metoprolol  Follow-up with Dr Antoine Poche in 3-4 weeks   Hillis Range, MD 03/01/2012 8:40 AM

## 2012-03-03 ENCOUNTER — Encounter: Payer: Self-pay | Admitting: Cardiology

## 2012-03-06 ENCOUNTER — Encounter: Payer: Self-pay | Admitting: *Deleted

## 2012-03-06 DIAGNOSIS — Z950 Presence of cardiac pacemaker: Secondary | ICD-10-CM | POA: Insufficient documentation

## 2012-03-10 ENCOUNTER — Encounter: Payer: Self-pay | Admitting: Gastroenterology

## 2012-03-13 ENCOUNTER — Encounter: Payer: Self-pay | Admitting: Internal Medicine

## 2012-03-13 ENCOUNTER — Ambulatory Visit (INDEPENDENT_AMBULATORY_CARE_PROVIDER_SITE_OTHER): Payer: Medicare Other | Admitting: *Deleted

## 2012-03-13 DIAGNOSIS — R001 Bradycardia, unspecified: Secondary | ICD-10-CM

## 2012-03-13 DIAGNOSIS — I498 Other specified cardiac arrhythmias: Secondary | ICD-10-CM

## 2012-03-13 DIAGNOSIS — I4891 Unspecified atrial fibrillation: Secondary | ICD-10-CM

## 2012-03-13 LAB — PACEMAKER DEVICE OBSERVATION
AL IMPEDENCE PM: 608 Ohm
AL THRESHOLD: 0.75 V
ATRIAL PACING PM: 73
BAMS-0001: 150 {beats}/min
BATTERY VOLTAGE: 2.79 V

## 2012-03-13 NOTE — Progress Notes (Signed)
Wound check pacer in clinic  

## 2012-03-15 ENCOUNTER — Ambulatory Visit (INDEPENDENT_AMBULATORY_CARE_PROVIDER_SITE_OTHER): Payer: Medicare Other | Admitting: Gastroenterology

## 2012-03-15 ENCOUNTER — Encounter: Payer: Self-pay | Admitting: Gastroenterology

## 2012-03-15 VITALS — BP 180/60 | HR 80 | Ht 62.0 in | Wt 169.0 lb

## 2012-03-15 DIAGNOSIS — Z860100 Personal history of colon polyps, unspecified: Secondary | ICD-10-CM

## 2012-03-15 NOTE — Progress Notes (Signed)
Review of pertinent gastrointestinal problems:  1. Adenomatous polyps 05/2007: two polyps, one was pedunculated 1.8cm, completely removed but lost in diverticular left colon. Recommended repeat exam at 3 year interval.

## 2012-03-15 NOTE — Progress Notes (Signed)
Review of pertinent gastrointestinal problems:  1. Adenomatous polyps 05/2007: two polyps, one was pedunculated 1.8cm, completely removed but lost in diverticular left colon. Recommended repeat exam at 3 year interval.   HPI: This is a very pleasant 78 year old woman whom I saw one to 2 months ago.  Felt hypoglycemic in office (Dizzy, lightheaded, we gave her some peanut butter and she felt much better)  Had permanent pacemaker placed 1-2 weeks ago. Since then she feels MUCH, less fatigued.  Breathing fine.  No pains in her chest.   Past Medical History  Diagnosis Date  . Hypertension     with h/o hypertensive urgency  . Glaucoma   . Tremor, essential   . Diabetes mellitus with neuropathy   . B12 deficiency   . Hyperlipidemia   . Atrial fibrillation     a. on chronic anticoagulation  b. Tiksosyn discontinued and amiodarone initiated 11/2011,  . Chronic diastolic heart failure     echo 05/2009: mild LVH, EF 55-60%, grade 2 diast dysfxn, mild LAE, PASP  . CAD (coronary artery disease)     a.  LHC 12/27/11: mLAD 30%, prox-mid D1 90% (small);  CFX patent stent, OM3 small 95%,, mRCA 30%, EF 65%  -  med Rx; b. 07/2011 NSTEMI - PCI/DES LCX - Resolute stent;  . Carotid stenosis     dopplers 6/12: 1-39% bilat ICA  . Pulmonary fibrosis     Dr. Vassie Loll;  patient taken off O2 in 8/12; dyspnea felt CHF>>ILD  . Skin cancer   . Warfarin anticoagulation   . Hypomagnesemia   . Colon polyps   . Syncope     unknown etiology  . Bradycardia   . Myocardial infarction     " very mild :"  . Pacemaker 02/29/2012  . Shortness of breath   . Diabetes mellitus     insulin controled  . CHF (congestive heart failure)     Past Surgical History  Procedure Date  . Appendectomy   . Nose surgery   . Colonoscopy   . Cardiac catheterization 2013    stent  . Coronary angioplasty with stent placement   . Insert / replace / remove pacemaker 02/29/2012  . Dilation and curettage of uterus     Current  Outpatient Prescriptions  Medication Sig Dispense Refill  . amLODipine (NORVASC) 5 MG tablet Take 1 tablet (5 mg total) by mouth daily.  30 tablet  3  . Cholecalciferol (VITAMIN D) 1000 UNITS capsule Take 1,000 Units by mouth at bedtime. OTC      . clopidogrel (PLAVIX) 75 MG tablet Take 75 mg by mouth daily.      . colesevelam (WELCHOL) 625 MG tablet Take 625 mg by mouth at bedtime.       . Cyanocobalamin (VITAMIN B-12 IJ) Inject 1 application as directed every 30 (thirty) days. Given at the doctor's office at the end of each month      . fish oil-omega-3 fatty acids 1000 MG capsule Take 1 g by mouth at bedtime.       . furosemide (LASIX) 20 MG tablet Take 1 tablet (20 mg total) by mouth every morning.  30 tablet  6  . insulin aspart (NOVOLOG FLEXPEN) 100 UNIT/ML injection Inject 10 Units into the skin 3 (three) times daily before meals.      . insulin detemir (LEVEMIR) 100 UNIT/ML injection Inject 20 Units into the skin every morning.      . isosorbide mononitrate (IMDUR) 60 MG 24 hr tablet  Take 60 mg by mouth every morning.       . latanoprost (XALATAN) 0.005 % ophthalmic solution Place 1 drop into both eyes at bedtime.       . magnesium oxide (MAG-OX) 400 MG tablet Take 400 mg by mouth 3 (three) times daily.      . metFORMIN (GLUCOPHAGE) 1000 MG tablet Take 1,000 mg by mouth 2 (two) times daily with a meal.      . metoprolol tartrate (LOPRESSOR) 25 MG tablet Take 1 tablet (25 mg total) by mouth 2 (two) times daily.  60 tablet  11  . nitroGLYCERIN (NITROSTAT) 0.4 MG SL tablet Place 0.4 mg under the tongue every 5 (five) minutes as needed. As needed for chest pain      . potassium chloride SA (K-DUR,KLOR-CON) 20 MEQ tablet Take 20 mEq by mouth every morning.      . warfarin (COUMADIN) 5 MG tablet Take 1-2 tablets (5-10 mg total) by mouth daily. 2 tablets Monday, Wednesday and Friday and 1 tablet all other days.      Marland Kitchen DISCONTD: diltiazem (TIAZAC) 420 MG 24 hr capsule Take 420 mg by mouth daily.         Allergies as of 03/15/2012 - Review Complete 03/15/2012  Allergen Reaction Noted  . Ace inhibitors Cough 11/09/2010  . Crestor (rosuvastatin calcium)  11/09/2010  . Lipitor (atorvastatin calcium)  11/09/2010  . Niaspan (niacin)  11/09/2010  . Olmesartan Cough 05/23/2011  . Prednisone      Family History  Problem Relation Age of Onset  . Throat cancer Brother   . Prostate cancer Father   . Diabetes Mother   . Diabetes Brother     multiple  . Heart disease Brother   . Colon cancer Neg Hx     History   Social History  . Marital Status: Widowed    Spouse Name: N/A    Number of Children: 1  . Years of Education: N/A   Occupational History  . retired    Social History Main Topics  . Smoking status: Never Smoker   . Smokeless tobacco: Never Used  . Alcohol Use: No  . Drug Use: No  . Sexually Active: No   Other Topics Concern  . Not on file   Social History Narrative   Widow, lives alone at home.      Physical Exam: BP 180/60  Pulse 80  Ht 5\' 2"  (1.575 m)  Wt 169 lb (76.658 kg)  BMI 30.91 kg/m2 Constitutional: generally well-appearing Psychiatric: alert and oriented x3 Abdomen: soft, nontender, nondistended, no obvious ascites, no peritoneal signs, normal bowel sounds     Assessment and plan: 78 y.o. female with significant cardiac disease, coronary artery disease as well as tachycardia bradycardia syndrome.  Colon polyp surveillance often stops between the ages 81 and 85. She did have a 1.4 cm polyp removed about 5 years ago and ideally she should have undergone a colonoscopy about 2 years ago. Currently she is 73 with significant cardiac problems. She is on Coumadin and is also on Plavix. She is not anemic and she sees no overt bleeding. She does have microscopically positive blood in her stool. I explained to her that polyp surveillance usually stops around 75-80. Given her recent cardiac issues I favor not performing any type of invasive testing at  this point. She will return to see me in 12 months and sooner if needed

## 2012-03-15 NOTE — Patient Instructions (Addendum)
Return to see Dr. Christella Hartigan in 12 months to think again about surveillance colonoscopy (these type of surveillance tests usually stop between age 76-80).

## 2012-03-21 ENCOUNTER — Other Ambulatory Visit (HOSPITAL_COMMUNITY): Payer: Self-pay | Admitting: Nurse Practitioner

## 2012-03-24 ENCOUNTER — Other Ambulatory Visit (HOSPITAL_COMMUNITY): Payer: Self-pay | Admitting: Nurse Practitioner

## 2012-03-27 ENCOUNTER — Encounter (INDEPENDENT_AMBULATORY_CARE_PROVIDER_SITE_OTHER): Payer: Medicare Other | Admitting: Ophthalmology

## 2012-03-27 DIAGNOSIS — H43819 Vitreous degeneration, unspecified eye: Secondary | ICD-10-CM

## 2012-03-27 DIAGNOSIS — H35039 Hypertensive retinopathy, unspecified eye: Secondary | ICD-10-CM

## 2012-03-27 DIAGNOSIS — I10 Essential (primary) hypertension: Secondary | ICD-10-CM

## 2012-03-27 DIAGNOSIS — E11311 Type 2 diabetes mellitus with unspecified diabetic retinopathy with macular edema: Secondary | ICD-10-CM

## 2012-03-27 DIAGNOSIS — E1165 Type 2 diabetes mellitus with hyperglycemia: Secondary | ICD-10-CM

## 2012-03-27 DIAGNOSIS — E11359 Type 2 diabetes mellitus with proliferative diabetic retinopathy without macular edema: Secondary | ICD-10-CM

## 2012-04-05 ENCOUNTER — Ambulatory Visit (INDEPENDENT_AMBULATORY_CARE_PROVIDER_SITE_OTHER): Payer: Medicare Other | Admitting: Cardiology

## 2012-04-05 ENCOUNTER — Encounter: Payer: Self-pay | Admitting: Cardiology

## 2012-04-05 VITALS — BP 150/80 | HR 96 | Ht 62.0 in | Wt 169.0 lb

## 2012-04-05 DIAGNOSIS — R079 Chest pain, unspecified: Secondary | ICD-10-CM

## 2012-04-05 DIAGNOSIS — I5033 Acute on chronic diastolic (congestive) heart failure: Secondary | ICD-10-CM

## 2012-04-05 DIAGNOSIS — I4891 Unspecified atrial fibrillation: Secondary | ICD-10-CM

## 2012-04-05 DIAGNOSIS — I498 Other specified cardiac arrhythmias: Secondary | ICD-10-CM

## 2012-04-05 DIAGNOSIS — R001 Bradycardia, unspecified: Secondary | ICD-10-CM

## 2012-04-05 MED ORDER — METOPROLOL TARTRATE 50 MG PO TABS: 50.0000 mg | ORAL_TABLET | Freq: Two times a day (BID) | ORAL | Status: DC

## 2012-04-05 NOTE — Progress Notes (Signed)
HPI The patient presents for evaluation of tachybradycardia syndrome. She has had a pacemaker placed since I last saw her. She's not had any of the severe presyncope that she had though she had a less severe episode several days ago while standing and cooking. She had to sit down. He is careful about position changes and so she's not describing orthostasis. She's not having any chest pressure, neck or arm discomfort. She's having no new shortness of breath, PND or orthopnea. She has however had some increased heart rates and reported one short burst in the 140s. He tolerates a blood thinner and has had no evidence of bleeding.  Allergies  Allergen Reactions  . Ace Inhibitors Cough    Enalapril  benicor    . Crestor (Rosuvastatin Calcium)     Fatigue and leg pain   . Lipitor (Atorvastatin Calcium)     Myalgias   . Niaspan (Niacin)     Intol   . Olmesartan Cough  . Prednisone     REACTION: "retained fluid"    Current Outpatient Prescriptions  Medication Sig Dispense Refill  . amLODipine (NORVASC) 5 MG tablet Take 1 tablet (5 mg total) by mouth daily.  30 tablet  3  . Cholecalciferol (VITAMIN D) 1000 UNITS capsule Take 1,000 Units by mouth at bedtime. OTC      . clopidogrel (PLAVIX) 75 MG tablet Take 75 mg by mouth daily.      . colesevelam (WELCHOL) 625 MG tablet Take 625 mg by mouth at bedtime.       . Cyanocobalamin (VITAMIN B-12 IJ) Inject 1 application as directed every 30 (thirty) days. Given at the doctor's office at the end of each month      . fish oil-omega-3 fatty acids 1000 MG capsule Take 1 g by mouth at bedtime.       . furosemide (LASIX) 20 MG tablet Take 1 tablet (20 mg total) by mouth every morning.  30 tablet  6  . insulin aspart (NOVOLOG FLEXPEN) 100 UNIT/ML injection Inject 10 Units into the skin 3 (three) times daily before meals.      . insulin detemir (LEVEMIR) 100 UNIT/ML injection Inject 20 Units into the skin every morning.      . isosorbide mononitrate  (IMDUR) 60 MG 24 hr tablet Take 60 mg by mouth every morning.       . latanoprost (XALATAN) 0.005 % ophthalmic solution Place 1 drop into both eyes at bedtime.       . magnesium oxide (MAG-OX) 400 MG tablet Take 400 mg by mouth 3 (three) times daily.      . metFORMIN (GLUCOPHAGE) 1000 MG tablet Take 1,000 mg by mouth 2 (two) times daily with a meal.      . metoprolol tartrate (LOPRESSOR) 25 MG tablet Take 1 tablet (25 mg total) by mouth 2 (two) times daily.  60 tablet  11  . nitroGLYCERIN (NITROSTAT) 0.4 MG SL tablet Place 0.4 mg under the tongue every 5 (five) minutes as needed. As needed for chest pain      . potassium chloride SA (K-DUR,KLOR-CON) 20 MEQ tablet Take 20 mEq by mouth every morning.      . warfarin (COUMADIN) 5 MG tablet Take 1-2 tablets (5-10 mg total) by mouth daily. 2 tablets Monday, Wednesday and Friday and 1 tablet all other days.      Marland Kitchen DISCONTD: diltiazem (TIAZAC) 420 MG 24 hr capsule Take 420 mg by mouth daily.  Past Medical History  Diagnosis Date  . Hypertension     with h/o hypertensive urgency  . Glaucoma(365)   . Tremor, essential   . Diabetes mellitus with neuropathy   . B12 deficiency   . Hyperlipidemia   . Atrial fibrillation     a. on chronic anticoagulation  b. Tiksosyn discontinued and amiodarone initiated 11/2011,  . Chronic diastolic heart failure     echo 05/2009: mild LVH, EF 55-60%, grade 2 diast dysfxn, mild LAE, PASP  . CAD (coronary artery disease)     a.  LHC 12/27/11: mLAD 30%, prox-mid D1 90% (small);  CFX patent stent, OM3 small 95%,, mRCA 30%, EF 65%  -  med Rx; b. 07/2011 NSTEMI - PCI/DES LCX - Resolute stent;  . Carotid stenosis     dopplers 6/12: 1-39% bilat ICA  . Pulmonary fibrosis     Dr. Vassie Loll;  patient taken off O2 in 8/12; dyspnea felt CHF>>ILD  . Skin cancer   . Warfarin anticoagulation   . Hypomagnesemia   . Colon polyps   . Syncope     unknown etiology  . Bradycardia   . Myocardial infarction     " very mild :"  .  Pacemaker 02/29/2012  . Shortness of breath   . Diabetes mellitus     insulin controled  . CHF (congestive heart failure)     Past Surgical History  Procedure Date  . Appendectomy   . Nose surgery   . Colonoscopy   . Cardiac catheterization 2013    stent  . Coronary angioplasty with stent placement   . Insert / replace / remove pacemaker 02/29/2012  . Dilation and curettage of uterus    ROS:  As stated in the HPI and negative for all other systems.  PHYSICAL EXAM BP 150/80  Pulse 96  Ht 5\' 2"  (1.575 m)  Wt 76.658 kg (169 lb)  BMI 30.91 kg/m2 GENERAL:  Well appearing HEENT:  Pupils equal round and reactive, fundi not visualized, oral mucosa unremarkable NECK:  No jugular venous distention, waveform within normal limits, carotid upstroke brisk and symmetric, left bruit, no thyromegaly LYMPHATICS:  No cervical, inguinal adenopathy LUNGS:  Clear to auscultation bilaterally BACK:  No CVA tenderness CHEST:  Well healed pacemaker pocket HEART:  PMI not displaced or sustained,S1 and S2 within normal limits, no S3, no S4, no clicks, no rubs, no murmurs ABD:  Flat, positive bowel sounds normal in frequency in pitch, mid bruits, no rebound, no guarding, no midline pulsatile mass, no hepatomegaly, no splenomegaly EXT:  2 plus pulses throughout, no edema, no cyanosis no clubbing SKIN:  No rashes no nodules NEURO:  Cranial nerves II through XII grossly intact, motor grossly intact throughout, resting tremor PSYCH:  Cognitively intact, oriented to person place and time   ASSESSMENT AND PLAN   FIBRILLATION, ATRIAL -  The patient is now status post pacemaker placement. Multiple attempts at rhythm control have failed. She tolerates anticoagulation. At this point she's still having a few paroxysms and I will increase her metoprolol to 50 mg twice a day.  HYPERTENSION -  She had previously had orthostasis. She's describing some lightheadedness which might be consistent with this but she  also had increased heart rates. She's had elevated blood pressures as she does today. Therefore, I think the most prudent next step is still on the beta blocker as discussed.   ISCHEMIC CARDIOMYOPATHY -  She is euvolemic. She will continue with meds as listed with the change  listed above.  SYNCOPE -  She's had no further syncope with the plan is as above.   CAD -  She had nonobstructive disease. She will continue with risk reduction.

## 2012-04-05 NOTE — Patient Instructions (Addendum)
Please increase your Metoprolol to 50 mg twice a day Continue all other medications as listed.  Follow up with Dr Antoine Poche in 06/2012 in [place].

## 2012-05-15 ENCOUNTER — Encounter (INDEPENDENT_AMBULATORY_CARE_PROVIDER_SITE_OTHER): Payer: Medicare Other | Admitting: Ophthalmology

## 2012-05-15 DIAGNOSIS — H35039 Hypertensive retinopathy, unspecified eye: Secondary | ICD-10-CM

## 2012-05-15 DIAGNOSIS — E11359 Type 2 diabetes mellitus with proliferative diabetic retinopathy without macular edema: Secondary | ICD-10-CM

## 2012-05-15 DIAGNOSIS — E11311 Type 2 diabetes mellitus with unspecified diabetic retinopathy with macular edema: Secondary | ICD-10-CM

## 2012-05-15 DIAGNOSIS — I10 Essential (primary) hypertension: Secondary | ICD-10-CM

## 2012-05-15 DIAGNOSIS — E1139 Type 2 diabetes mellitus with other diabetic ophthalmic complication: Secondary | ICD-10-CM

## 2012-05-15 DIAGNOSIS — H43819 Vitreous degeneration, unspecified eye: Secondary | ICD-10-CM

## 2012-05-18 ENCOUNTER — Other Ambulatory Visit (HOSPITAL_COMMUNITY): Payer: Self-pay | Admitting: Physician Assistant

## 2012-05-23 ENCOUNTER — Telehealth: Payer: Self-pay | Admitting: Cardiology

## 2012-05-23 NOTE — Telephone Encounter (Signed)
Spoke with pt who if feeling "BAD,BAD"  She reports being SOB with walking and having trouble sleeping at night.  She is sleeping in a lift chair in the den so that she can prop up enough to be able to breath.  She also complains of feeling dizzy.  She is scheduled to be seen in the [place] office in the AM.

## 2012-05-23 NOTE — Telephone Encounter (Signed)
New problem:   C/o sob . Went to pcp  last week advise she had fluid on lungs.

## 2012-05-24 ENCOUNTER — Ambulatory Visit (INDEPENDENT_AMBULATORY_CARE_PROVIDER_SITE_OTHER): Payer: Medicare Other | Admitting: Cardiology

## 2012-05-24 ENCOUNTER — Encounter: Payer: Self-pay | Admitting: Cardiology

## 2012-05-24 VITALS — BP 111/50 | HR 100 | Ht 63.0 in | Wt 180.0 lb

## 2012-05-24 DIAGNOSIS — I4891 Unspecified atrial fibrillation: Secondary | ICD-10-CM

## 2012-05-24 NOTE — Patient Instructions (Addendum)
Please take Metoprolol as ordered twice a day Take Lasix 40 mg a day for 4 days then go back to 20 mg a day. You may take an extra 20 mg for 2 lbs weight gain over night.  Please weigh daily.  Decrease the salt in your diet.  Follow up as scheduled.

## 2012-05-24 NOTE — Progress Notes (Signed)
HPI The patient presents for evaluation of tachybradycardia syndrome and dyspnea. At the last visit she was describing some episodes of rapid heart rate. I asked her to increase her beta blocker to twice a day but she hasn't been doing this. She apparently has also been taking Pacerone when necessary. She has had some increasing dyspnea. She's had some mild lower extremity swelling.  It does look like her weight is up. However, her BNP was recently not elevated. Chest x-ray suggested perhaps a minimal effusion. She doesn't describe symptoms possibly consistent with PND.  She says she's been sleeping in a chair.  She has not had any presyncope or syncope. She does like "a little salt".  She does have dizziness but no syncope   Allergies  Allergen Reactions  . Ace Inhibitors Cough    Enalapril  benicor    . Crestor (Rosuvastatin Calcium)     Fatigue and leg pain   . Lipitor (Atorvastatin Calcium)     Myalgias   . Niaspan (Niacin)     Intol   . Olmesartan Cough  . Prednisone     REACTION: "retained fluid"    Current Outpatient Prescriptions  Medication Sig Dispense Refill  . amLODipine (NORVASC) 5 MG tablet TAKE ONE TABLET BY MOUTH ONE TIME DAILY  30 tablet  2  . Cholecalciferol (VITAMIN D) 1000 UNITS capsule Take 1,000 Units by mouth at bedtime. OTC      . clopidogrel (PLAVIX) 75 MG tablet Take 75 mg by mouth daily.      . colesevelam (WELCHOL) 625 MG tablet Take 625 mg by mouth at bedtime.       . fish oil-omega-3 fatty acids 1000 MG capsule Take 1 g by mouth at bedtime.       . furosemide (LASIX) 20 MG tablet Take 1 tablet (20 mg total) by mouth every morning.  30 tablet  6  . insulin aspart (NOVOLOG FLEXPEN) 100 UNIT/ML injection Inject 10 Units into the skin 3 (three) times daily before meals.      . insulin detemir (LEVEMIR) 100 UNIT/ML injection Inject 20 Units into the skin every morning.      . isosorbide mononitrate (IMDUR) 60 MG 24 hr tablet Take 60 mg by mouth every  morning.       . latanoprost (XALATAN) 0.005 % ophthalmic solution Place 1 drop into both eyes at bedtime.       . magnesium oxide (MAG-OX) 400 MG tablet Take 400 mg by mouth 3 (three) times daily.      . metFORMIN (GLUCOPHAGE) 1000 MG tablet Take 1,000 mg by mouth 2 (two) times daily with a meal.      . metoprolol tartrate (LOPRESSOR) 50 MG tablet Take 1 tablet (50 mg total) by mouth 2 (two) times daily.  60 tablet  11  . nitroGLYCERIN (NITROSTAT) 0.4 MG SL tablet Place 0.4 mg under the tongue every 5 (five) minutes as needed. As needed for chest pain      . PACERONE 200 MG tablet 1 tab as needed per pt      . potassium chloride SA (K-DUR,KLOR-CON) 20 MEQ tablet Take 20 mEq by mouth every morning.      . warfarin (COUMADIN) 5 MG tablet Take 1-2 tablets (5-10 mg total) by mouth daily. 2 tablets Monday, Wednesday and Friday and 1 tablet all other days.      . Cyanocobalamin (VITAMIN B-12 IJ) Inject 1 application as directed every 30 (thirty) days. Given at the  doctor's office at the end of each month      . [DISCONTINUED] diltiazem (TIAZAC) 420 MG 24 hr capsule Take 420 mg by mouth daily.        Past Medical History  Diagnosis Date  . Hypertension     with h/o hypertensive urgency  . Glaucoma(365)   . Tremor, essential   . Diabetes mellitus with neuropathy   . B12 deficiency   . Hyperlipidemia   . Atrial fibrillation     a. on chronic anticoagulation  b. Tiksosyn discontinued and amiodarone initiated 11/2011,  . Chronic diastolic heart failure     echo 05/2009: mild LVH, EF 55-60%, grade 2 diast dysfxn, mild LAE, PASP  . CAD (coronary artery disease)     a.  LHC 12/27/11: mLAD 30%, prox-mid D1 90% (small);  CFX patent stent, OM3 small 95%,, mRCA 30%, EF 65%  -  med Rx; b. 07/2011 NSTEMI - PCI/DES LCX - Resolute stent;  . Carotid stenosis     dopplers 6/12: 1-39% bilat ICA  . Pulmonary fibrosis     Dr. Vassie Loll;  patient taken off O2 in 8/12; dyspnea felt CHF>>ILD  . Skin cancer   . Warfarin  anticoagulation   . Hypomagnesemia   . Colon polyps   . Syncope     unknown etiology  . Bradycardia   . Myocardial infarction     " very mild :"  . Pacemaker 02/29/2012  . Shortness of breath   . Diabetes mellitus     insulin controled  . CHF (congestive heart failure)     Past Surgical History  Procedure Date  . Appendectomy   . Nose surgery   . Colonoscopy   . Cardiac catheterization 2013    stent  . Coronary angioplasty with stent placement   . Insert / replace / remove pacemaker 02/29/2012  . Dilation and curettage of uterus    ROS:  As stated in the HPI and negative for all other systems.  PHYSICAL EXAM BP 111/50  Pulse 100  Ht 5\' 3"  (1.6 m)  Wt 180 lb (81.647 kg)  BMI 31.89 kg/m2 GENERAL:  Well appearing HEENT:  Pupils equal round and reactive, fundi not visualized, oral mucosa unremarkable NECK:  No jugular venous distention, waveform within normal limits, carotid upstroke brisk and symmetric, left bruit, no thyromegaly LYMPHATICS:  No cervical, inguinal adenopathy LUNGS:  Clear to auscultation bilaterally BACK:  No CVA tenderness CHEST:  Well healed pacemaker pocket HEART:  PMI not displaced or sustained,S1 and S2 within normal limits, no S3, no S4, no clicks, no rubs, early peaking apical systolic murmur, no diastolic murmurs murmurs ABD:  Flat, positive bowel sounds normal in frequency in pitch, mid bruits, no rebound, no guarding, no midline pulsatile mass, no hepatomegaly, no splenomegaly EXT:  2 plus pulses throughout, mild ankle edema, no cyanosis no clubbing SKIN:  No rashes no nodules NEURO:  Cranial nerves II through XII grossly intact, motor grossly intact throughout, resting tremor PSYCH:  Cognitively intact, oriented to person place and time  EKG:  Atrial paced rhythm 376, left axis deviation, poor anterior R wave progression, no acute ST-T wave changes.  05/24/2012   ASSESSMENT AND PLAN   FIBRILLATION, ATRIAL -  She is not to be taking her  amiodarone. We again reviewed her metoprolol and she is to start taking 50 mg twice a day.  HYPERTENSION -  Her blood pressure is controlled today. She's had orthostasis in the past. I will try to  titrate her meds as listed.  DIASTOLIC DYSFUNCTION -  She is not overtly volume overloaded on exam. However, she's had weight gain in her symptoms are consistent with some extra volume. She should be weighing herself daily and taking a next 20 mg of Lasix with 2 pounds of weight gain. I will have her take 40 mg of Lasix daily for the next 4 days. She can then go back to her previous 20 mg daily dose. She needs to reduce her salt.  CAD -  She had nonobstructive disease. She will continue with risk reduction.

## 2012-05-28 DIAGNOSIS — D649 Anemia, unspecified: Secondary | ICD-10-CM

## 2012-05-28 DIAGNOSIS — R7989 Other specified abnormal findings of blood chemistry: Secondary | ICD-10-CM

## 2012-05-28 HISTORY — DX: Other specified abnormal findings of blood chemistry: R79.89

## 2012-05-28 HISTORY — DX: Anemia, unspecified: D64.9

## 2012-05-30 ENCOUNTER — Encounter (HOSPITAL_COMMUNITY): Payer: Self-pay

## 2012-05-30 ENCOUNTER — Inpatient Hospital Stay (HOSPITAL_COMMUNITY)
Admission: EM | Admit: 2012-05-30 | Discharge: 2012-06-05 | DRG: 291 | Disposition: A | Discharge status: Home Health | Payer: Medicare Other | Attending: Cardiology | Admitting: Cardiology

## 2012-05-30 ENCOUNTER — Emergency Department (HOSPITAL_COMMUNITY): Payer: Medicare Other

## 2012-05-30 DIAGNOSIS — I10 Essential (primary) hypertension: Secondary | ICD-10-CM | POA: Diagnosis present

## 2012-05-30 DIAGNOSIS — E785 Hyperlipidemia, unspecified: Secondary | ICD-10-CM | POA: Diagnosis present

## 2012-05-30 DIAGNOSIS — J8410 Pulmonary fibrosis, unspecified: Secondary | ICD-10-CM | POA: Diagnosis present

## 2012-05-30 DIAGNOSIS — Z79899 Other long term (current) drug therapy: Secondary | ICD-10-CM

## 2012-05-30 DIAGNOSIS — Z9861 Coronary angioplasty status: Secondary | ICD-10-CM

## 2012-05-30 DIAGNOSIS — I5033 Acute on chronic diastolic (congestive) heart failure: Principal | ICD-10-CM | POA: Diagnosis present

## 2012-05-30 DIAGNOSIS — E1149 Type 2 diabetes mellitus with other diabetic neurological complication: Secondary | ICD-10-CM | POA: Diagnosis present

## 2012-05-30 DIAGNOSIS — IMO0002 Reserved for concepts with insufficient information to code with codable children: Secondary | ICD-10-CM | POA: Diagnosis present

## 2012-05-30 DIAGNOSIS — E1169 Type 2 diabetes mellitus with other specified complication: Secondary | ICD-10-CM | POA: Diagnosis present

## 2012-05-30 DIAGNOSIS — G250 Essential tremor: Secondary | ICD-10-CM | POA: Diagnosis present

## 2012-05-30 DIAGNOSIS — Z950 Presence of cardiac pacemaker: Secondary | ICD-10-CM

## 2012-05-30 DIAGNOSIS — I252 Old myocardial infarction: Secondary | ICD-10-CM

## 2012-05-30 DIAGNOSIS — I495 Sick sinus syndrome: Secondary | ICD-10-CM | POA: Diagnosis present

## 2012-05-30 DIAGNOSIS — I4891 Unspecified atrial fibrillation: Secondary | ICD-10-CM | POA: Diagnosis present

## 2012-05-30 DIAGNOSIS — Z888 Allergy status to other drugs, medicaments and biological substances status: Secondary | ICD-10-CM

## 2012-05-30 DIAGNOSIS — E1165 Type 2 diabetes mellitus with hyperglycemia: Secondary | ICD-10-CM | POA: Diagnosis present

## 2012-05-30 DIAGNOSIS — H409 Unspecified glaucoma: Secondary | ICD-10-CM | POA: Diagnosis present

## 2012-05-30 DIAGNOSIS — Z794 Long term (current) use of insulin: Secondary | ICD-10-CM

## 2012-05-30 DIAGNOSIS — I2510 Atherosclerotic heart disease of native coronary artery without angina pectoris: Secondary | ICD-10-CM | POA: Diagnosis present

## 2012-05-30 DIAGNOSIS — D649 Anemia, unspecified: Secondary | ICD-10-CM | POA: Diagnosis present

## 2012-05-30 DIAGNOSIS — E1142 Type 2 diabetes mellitus with diabetic polyneuropathy: Secondary | ICD-10-CM | POA: Diagnosis present

## 2012-05-30 DIAGNOSIS — N390 Urinary tract infection, site not specified: Secondary | ICD-10-CM | POA: Diagnosis present

## 2012-05-30 DIAGNOSIS — J9600 Acute respiratory failure, unspecified whether with hypoxia or hypercapnia: Secondary | ICD-10-CM | POA: Diagnosis present

## 2012-05-30 DIAGNOSIS — I509 Heart failure, unspecified: Secondary | ICD-10-CM | POA: Diagnosis present

## 2012-05-30 DIAGNOSIS — G252 Other specified forms of tremor: Secondary | ICD-10-CM | POA: Diagnosis present

## 2012-05-30 DIAGNOSIS — Z7901 Long term (current) use of anticoagulants: Secondary | ICD-10-CM

## 2012-05-30 DIAGNOSIS — Z833 Family history of diabetes mellitus: Secondary | ICD-10-CM

## 2012-05-30 DIAGNOSIS — Z7902 Long term (current) use of antithrombotics/antiplatelets: Secondary | ICD-10-CM

## 2012-05-30 DIAGNOSIS — Z8249 Family history of ischemic heart disease and other diseases of the circulatory system: Secondary | ICD-10-CM

## 2012-05-30 HISTORY — DX: Anemia, unspecified: D64.9

## 2012-05-30 HISTORY — DX: Other specified abnormal findings of blood chemistry: R79.89

## 2012-05-30 LAB — COMPREHENSIVE METABOLIC PANEL
ALT: 13 U/L (ref 0–35)
Albumin: 3.3 g/dL — ABNORMAL LOW (ref 3.5–5.2)
Alkaline Phosphatase: 105 U/L (ref 39–117)
Alkaline Phosphatase: 88 U/L (ref 39–117)
BUN: 16 mg/dL (ref 6–23)
BUN: 17 mg/dL (ref 6–23)
CO2: 27 mEq/L (ref 19–32)
CO2: 27 mEq/L (ref 19–32)
Calcium: 10.3 mg/dL (ref 8.4–10.5)
Chloride: 99 mEq/L (ref 96–112)
Creatinine, Ser: 0.65 mg/dL (ref 0.50–1.10)
GFR calc Af Amer: >90 mL/min (ref >90)
GFR calc Af Amer: >90 mL/min (ref >90)
GFR calc non Af Amer: 85 mL/min — ABNORMAL LOW (ref >90)
GFR calc non Af Amer: 82 mL/min — ABNORMAL LOW (ref >90)
Glucose, Bld: 274 mg/dL — ABNORMAL HIGH (ref 70–99)
Glucose, Bld: 266 mg/dL — ABNORMAL HIGH (ref 70–99)
Potassium: 4.2 mEq/L (ref 3.5–5.1)
Potassium: 4.3 mEq/L (ref 3.5–5.1)
Sodium: 135 mEq/L (ref 135–145)
Total Bilirubin: 0.4 mg/dL (ref 0.3–1.2)

## 2012-05-30 LAB — CK TOTAL AND CKMB (NOT AT ARMC)
Relative Index: INVALID (ref 0.0–2.5)
Relative Index: INVALID (ref 0.0–2.5)
Total CK: 27 U/L (ref 7–177)

## 2012-05-30 LAB — URINALYSIS, MICROSCOPIC ONLY
Bilirubin Urine: NEGATIVE
Glucose, UA: 500 mg/dL — AB
Ketones, ur: NEGATIVE mg/dL
Leukocytes, UA: NEGATIVE
Nitrite: NEGATIVE
Protein, ur: >300 mg/dL — AB

## 2012-05-30 LAB — CBC
HCT: 35 % — ABNORMAL LOW (ref 36.0–46.0)
Hemoglobin: 10.7 g/dL — ABNORMAL LOW (ref 12.0–15.0)
MCH: 25.2 pg — ABNORMAL LOW (ref 26.0–34.0)
RBC: 4.24 MIL/uL (ref 3.87–5.11)

## 2012-05-30 LAB — CBC WITH DIFFERENTIAL/PLATELET
Basophils Relative: 0 % (ref 0–1)
HCT: 32.4 % — ABNORMAL LOW (ref 36.0–46.0)
Hemoglobin: 9.8 g/dL — ABNORMAL LOW (ref 12.0–15.0)
Lymphs Abs: 0.5 10*3/uL — ABNORMAL LOW (ref 0.7–4.0)
MCHC: 30.2 g/dL (ref 30.0–36.0)
Monocytes Absolute: 0.9 10*3/uL (ref 0.1–1.0)
Monocytes Relative: 6 % (ref 3–12)
Neutro Abs: 13.3 10*3/uL — ABNORMAL HIGH (ref 1.7–7.7)
RBC: 3.95 MIL/uL (ref 3.87–5.11)

## 2012-05-30 LAB — GLUCOSE, CAPILLARY
Glucose-Capillary: 207 mg/dL — ABNORMAL HIGH (ref 70–99)
Glucose-Capillary: 132 mg/dL — ABNORMAL HIGH (ref 70–99)

## 2012-05-30 LAB — PRO B NATRIURETIC PEPTIDE: Pro B Natriuretic peptide (BNP): 1210 pg/mL — ABNORMAL HIGH (ref 0–450)

## 2012-05-30 LAB — MAGNESIUM: Magnesium: 1.6 mg/dL (ref 1.5–2.5)

## 2012-05-30 LAB — TROPONIN I: Troponin I: <0.3 ng/mL (ref <0.30)

## 2012-05-30 MED ORDER — NITROGLYCERIN 0.4 MG SL SUBL
SUBLINGUAL_TABLET | SUBLINGUAL | Status: AC
  Administered 2012-05-30: 0.4 mg via SUBLINGUAL
  Filled 2012-05-30: qty 25

## 2012-05-30 MED ORDER — CLOPIDOGREL BISULFATE 75 MG PO TABS: 75.0000 mg | ORAL_TABLET | Freq: Every day | ORAL | Status: DC

## 2012-05-30 MED ORDER — METFORMIN HCL 500 MG PO TABS
1000.0000 mg | ORAL_TABLET | Freq: Two times a day (BID) | ORAL | Status: DC
  Filled 2012-05-30 (×3): qty 2

## 2012-05-30 MED ORDER — COLESEVELAM HCL 625 MG PO TABS
625.0000 mg | ORAL_TABLET | Freq: Every day | ORAL | Status: DC
  Administered 2012-05-30 – 2012-06-04 (×6): 625 mg via ORAL
  Filled 2012-05-30 (×8): qty 1

## 2012-05-30 MED ORDER — ONDANSETRON HCL 4 MG/2ML IJ SOLN: 4.0000 mg | Freq: Four times a day (QID) | INTRAMUSCULAR | Status: DC | PRN

## 2012-05-30 MED ORDER — SODIUM CHLORIDE 0.9 % IV SOLN: 250.0000 mL | INTRAVENOUS | Status: DC | PRN

## 2012-05-30 MED ORDER — FUROSEMIDE 10 MG/ML IJ SOLN
80.0000 mg | Freq: Once | INTRAMUSCULAR | Status: AC
  Administered 2012-05-30: 80 mg via INTRAVENOUS

## 2012-05-30 MED ORDER — WARFARIN SODIUM 5 MG PO TABS
5.0000 mg | ORAL_TABLET | ORAL | Status: AC
  Administered 2012-05-30: 5 mg via ORAL
  Filled 2012-05-30: qty 1

## 2012-05-30 MED ORDER — POTASSIUM CHLORIDE CRYS ER 20 MEQ PO TBCR
20.0000 meq | EXTENDED_RELEASE_TABLET | Freq: Every morning | ORAL | Status: DC
  Administered 2012-05-30 – 2012-06-05 (×7): 20 meq via ORAL
  Filled 2012-05-30 (×8): qty 1

## 2012-05-30 MED ORDER — FUROSEMIDE 10 MG/ML IJ SOLN
80.0000 mg | Freq: Two times a day (BID) | INTRAMUSCULAR | Status: DC
  Administered 2012-05-30 – 2012-05-31 (×4): 80 mg via INTRAVENOUS
  Filled 2012-05-30 (×4): qty 8
  Filled 2012-05-30: qty 4
  Filled 2012-05-30: qty 8
  Filled 2012-05-30: qty 4
  Filled 2012-05-30: qty 8

## 2012-05-30 MED ORDER — HEPARIN SODIUM (PORCINE) 5000 UNIT/ML IJ SOLN
5000.0000 [IU] | Freq: Three times a day (TID) | INTRAMUSCULAR | Status: DC
  Filled 2012-05-30: qty 1

## 2012-05-30 MED ORDER — MORPHINE SULFATE 2 MG/ML IJ SOLN
INTRAMUSCULAR | Status: AC
  Administered 2012-05-30: 2 mg via INTRAVENOUS
  Filled 2012-05-30: qty 1

## 2012-05-30 MED ORDER — PANTOPRAZOLE SODIUM 40 MG PO TBEC
40.0000 mg | DELAYED_RELEASE_TABLET | Freq: Every day | ORAL | Status: DC
  Administered 2012-05-30 – 2012-06-05 (×7): 40 mg via ORAL
  Filled 2012-05-30 (×6): qty 1

## 2012-05-30 MED ORDER — SODIUM CHLORIDE 0.9 % IJ SOLN
3.0000 mL | Freq: Two times a day (BID) | INTRAMUSCULAR | Status: DC
  Administered 2012-05-30 (×3): 3 mL via INTRAVENOUS
  Administered 2012-05-31: 10:00:00 via INTRAVENOUS
  Administered 2012-05-31 – 2012-06-04 (×9): 3 mL via INTRAVENOUS
  Filled 2012-05-30: qty 3

## 2012-05-30 MED ORDER — METOPROLOL TARTRATE 50 MG PO TABS
50.0000 mg | ORAL_TABLET | Freq: Two times a day (BID) | ORAL | Status: DC
  Filled 2012-05-30 (×2): qty 1

## 2012-05-30 MED ORDER — INSULIN DETEMIR 100 UNIT/ML ~~LOC~~ SOLN
20.0000 [IU] | Freq: Every morning | SUBCUTANEOUS | Status: DC
  Administered 2012-05-30 – 2012-06-05 (×7): 20 [IU] via SUBCUTANEOUS
  Filled 2012-05-30: qty 10

## 2012-05-30 MED ORDER — GLUCOSE 40 % PO GEL: 1.0000 | ORAL | Status: DC | PRN

## 2012-05-30 MED ORDER — WARFARIN SODIUM 10 MG PO TABS: 10.0000 mg | ORAL_TABLET | ORAL | Status: DC

## 2012-05-30 MED ORDER — CLOPIDOGREL BISULFATE 75 MG PO TABS
75.0000 mg | ORAL_TABLET | Freq: Every day | ORAL | Status: DC
  Administered 2012-05-30 – 2012-06-05 (×7): 75 mg via ORAL
  Filled 2012-05-30 (×9): qty 1

## 2012-05-30 MED ORDER — MAGNESIUM OXIDE 400 MG PO TABS: 400.0000 mg | ORAL_TABLET | Freq: Three times a day (TID) | ORAL | Status: DC

## 2012-05-30 MED ORDER — MORPHINE SULFATE 2 MG/ML IJ SOLN
2.0000 mg | Freq: Once | INTRAMUSCULAR | Status: AC
  Administered 2012-05-30: 2 mg via INTRAVENOUS

## 2012-05-30 MED ORDER — METOPROLOL TARTRATE 50 MG PO TABS
50.0000 mg | ORAL_TABLET | Freq: Two times a day (BID) | ORAL | Status: DC
  Administered 2012-05-30 – 2012-06-05 (×12): 50 mg via ORAL
  Filled 2012-05-30 (×14): qty 1

## 2012-05-30 MED ORDER — WARFARIN - PHYSICIAN DOSING INPATIENT: Freq: Every day | Status: DC

## 2012-05-30 MED ORDER — CIPROFLOXACIN HCL 250 MG PO TABS
250.0000 mg | ORAL_TABLET | Freq: Two times a day (BID) | ORAL | Status: DC
  Administered 2012-05-30 – 2012-06-03 (×9): 250 mg via ORAL
  Filled 2012-05-30 (×12): qty 1

## 2012-05-30 MED ORDER — ACETAMINOPHEN 325 MG PO TABS: 650.0000 mg | ORAL_TABLET | ORAL | Status: DC | PRN

## 2012-05-30 MED ORDER — MAGNESIUM OXIDE 400 (241.3 MG) MG PO TABS
400.0000 mg | ORAL_TABLET | Freq: Three times a day (TID) | ORAL | Status: DC
  Administered 2012-05-30 – 2012-06-05 (×18): 400 mg via ORAL
  Filled 2012-05-30 (×22): qty 1

## 2012-05-30 MED ORDER — NITROGLYCERIN IN D5W 200-5 MCG/ML-% IV SOLN
INTRAVENOUS | Status: AC
  Administered 2012-05-30: 10 ug/min via INTRAVENOUS
  Filled 2012-05-30: qty 250

## 2012-05-30 MED ORDER — WARFARIN SODIUM 2.5 MG PO TABS
2.5000 mg | ORAL_TABLET | ORAL | Status: DC
  Filled 2012-05-30: qty 1

## 2012-05-30 MED ORDER — METOPROLOL TARTRATE 25 MG/10 ML ORAL SUSPENSION
50.0000 mg | Freq: Two times a day (BID) | ORAL | Status: DC
  Administered 2012-05-30: 50 mg via ORAL
  Filled 2012-05-30 (×2): qty 20

## 2012-05-30 MED ORDER — NITROGLYCERIN IN D5W 200-5 MCG/ML-% IV SOLN: 2.0000 ug/min | INTRAVENOUS | Status: DC

## 2012-05-30 MED ORDER — FUROSEMIDE 10 MG/ML IJ SOLN
INTRAMUSCULAR | Status: AC
  Administered 2012-05-30: 80 mg via INTRAVENOUS
  Filled 2012-05-30: qty 8

## 2012-05-30 MED ORDER — WARFARIN SODIUM 5 MG PO TABS: 5.0000 mg | ORAL_TABLET | Freq: Every day | ORAL | Status: DC

## 2012-05-30 MED ORDER — LATANOPROST 0.005 % OP SOLN
1.0000 [drp] | Freq: Every day | OPHTHALMIC | Status: DC
  Administered 2012-05-30 – 2012-06-04 (×6): 1 [drp] via OPHTHALMIC
  Filled 2012-05-30 (×2): qty 2.5

## 2012-05-30 MED ORDER — NITROGLYCERIN 0.4 MG SL SUBL
0.4000 mg | SUBLINGUAL_TABLET | SUBLINGUAL | Status: DC | PRN
  Administered 2012-05-30 (×3): 0.4 mg via SUBLINGUAL

## 2012-05-30 MED ORDER — INSULIN ASPART 100 UNIT/ML ~~LOC~~ SOLN
10.0000 [IU] | Freq: Three times a day (TID) | SUBCUTANEOUS | Status: DC
  Administered 2012-05-30 – 2012-06-04 (×12): 10 [IU] via SUBCUTANEOUS

## 2012-05-30 MED ORDER — GLUCOSE-VITAMIN C 4-6 GM-MG PO CHEW: 4.0000 | CHEWABLE_TABLET | ORAL | Status: DC | PRN

## 2012-05-30 MED ORDER — GLUCOSE 40 % PO GEL
ORAL | Status: AC
  Administered 2012-05-30: 37.5 g
  Filled 2012-05-30: qty 1

## 2012-05-30 MED ORDER — NITROGLYCERIN IN D5W 200-5 MCG/ML-% IV SOLN
5.0000 ug/min | INTRAVENOUS | Status: DC
  Administered 2012-05-30: 10 ug/min via INTRAVENOUS

## 2012-05-30 MED ORDER — AMLODIPINE BESYLATE 5 MG PO TABS
5.0000 mg | ORAL_TABLET | Freq: Every day | ORAL | Status: DC
  Administered 2012-05-30 – 2012-06-04 (×6): 5 mg via ORAL
  Filled 2012-05-30 (×7): qty 1

## 2012-05-30 MED ORDER — SODIUM CHLORIDE 0.9 % IJ SOLN: 3.0000 mL | INTRAMUSCULAR | Status: DC | PRN

## 2012-05-30 MED ORDER — WARFARIN SODIUM 5 MG PO TABS: 5.0000 mg | ORAL_TABLET | ORAL | Status: DC

## 2012-05-30 MED ORDER — METFORMIN HCL 500 MG PO TABS
1000.0000 mg | ORAL_TABLET | Freq: Two times a day (BID) | ORAL | Status: DC
  Administered 2012-05-30 – 2012-06-05 (×10): 1000 mg via ORAL
  Filled 2012-05-30 (×15): qty 2

## 2012-05-30 NOTE — ED Notes (Signed)
Pt reports breathing "a little better"; however, remains tachypneic at 32 breaths per minute; unable to speak in complete sentences; remains on 15 L NRB mask; family at bedside - plan odf care discussed - pt and family verbalized understanding

## 2012-05-30 NOTE — H&P (Signed)
CARDIOLOGY ADMISSION NOTE  Patient ID: Alexandria [name] MRN: [id number] DOB/AGE: [id number] 76 y.o.  Admit date: 05/30/2012 Primary Physician   [name], MD Primary Cardiologist   Dr. Antoine Poche Chief Complaint    Dsypnea  HPI:  The patient is well known to me.  She has a history of diastolic HF, CAD and tachybrady syndrome.  I saw her recently and she had some evidence of increased volume with increased weight and probable orthopnea.  I increased her Lasix.    She was noted to have a SBP greater than 200 when EMS arrived.  Sats were initially in the 70s.  She was treated with NTG SL and bronchodilators.  She was transported as a code STEMI.  However, there were no acute EKG changes noted on arrival to the ER and this was cancelled.  Exam and Xray were consistent with acute pulmonary edema with hypertensive urgency.  Initial SBP was 228.  She reports that she has continued to have orthopnea and does not think that she had relief from the increased Lasix.  She has had no new lower extremity swelling.  She has had no palpitations or syncope.  She denies any chest pain, neck or arm pain.  She has had no fevers or chills.    Past Medical History  Diagnosis Date  . Hypertension     with h/o hypertensive urgency  . Glaucoma(365)   . Tremor, essential   . Diabetes mellitus with neuropathy   . B12 deficiency   . Hyperlipidemia   . Atrial fibrillation     a. on chronic anticoagulation  b. Tiksosyn discontinued and amiodarone  was discontinued secondary to perceived symptoms  . Chronic diastolic heart failure     echo 05/2009: mild LVH, EF 55-60%, grade 2 diast dysfxn, mild LAE, PASP  . CAD (coronary artery disease)     a.  LHC 12/27/11: mLAD 30%, prox-mid D1 90% (small);  CFX patent stent, OM3 small 95%,, mRCA 30%, EF 65%  -  med Rx; b. 07/2011 NSTEMI - PCI/DES LCX - Resolute stent;  . Carotid stenosis     dopplers 6/12: 1-39% bilat ICA  . Pulmonary fibrosis     Dr. Vassie Loll;  patient taken  off O2 in 8/12; dyspnea felt CHF>>ILD  . Skin cancer   . Warfarin anticoagulation   . Hypomagnesemia   . Colon polyps   . Syncope     unknown etiology  . Bradycardia   . Myocardial infarction     " very mild :"  . Pacemaker 02/29/2012  . Shortness of breath   . Diabetes mellitus     insulin controled  . CHF (congestive heart failure)     Past Surgical History  Procedure Date  . Appendectomy   . Nose surgery   . Colonoscopy   . Cardiac catheterization 2013    stent  . Coronary angioplasty with stent placement   . Insert / replace / remove pacemaker 02/29/2012  . Dilation and curettage of uterus     Allergies  Allergen Reactions  . Ace Inhibitors Cough    Enalapril  benicor    . Crestor (Rosuvastatin Calcium)     Fatigue and leg pain   . Lipitor (Atorvastatin Calcium)     Myalgias   . Niaspan (Niacin)     Intol   . Olmesartan Cough  . Prednisone     REACTION: "retained fluid"   No current facility-administered medications on file prior to encounter.   Current  Outpatient Prescriptions on File Prior to Encounter  Medication Sig Dispense Refill  . amLODipine (NORVASC) 5 MG tablet TAKE ONE TABLET BY MOUTH ONE TIME DAILY  30 tablet  2  . Cholecalciferol (VITAMIN D) 1000 UNITS capsule Take 1,000 Units by mouth at bedtime. OTC      . clopidogrel (PLAVIX) 75 MG tablet Take 75 mg by mouth daily.      . colesevelam (WELCHOL) 625 MG tablet Take 625 mg by mouth at bedtime.       . Cyanocobalamin (VITAMIN B-12 IJ) Inject 1 application as directed every 30 (thirty) days. Given at the doctor's office at the end of each month      . fish oil-omega-3 fatty acids 1000 MG capsule Take 1 g by mouth at bedtime.       . furosemide (LASIX) 20 MG tablet Take 1 tablet (20 mg total) by mouth every morning.  30 tablet  6  . insulin aspart (NOVOLOG FLEXPEN) 100 UNIT/ML injection Inject 10 Units into the skin 3 (three) times daily before meals.      . insulin detemir (LEVEMIR) 100 UNIT/ML  injection Inject 20 Units into the skin every morning.      . isosorbide mononitrate (IMDUR) 60 MG 24 hr tablet Take 60 mg by mouth every morning.       . latanoprost (XALATAN) 0.005 % ophthalmic solution Place 1 drop into both eyes at bedtime.       . magnesium oxide (MAG-OX) 400 MG tablet Take 400 mg by mouth 3 (three) times daily.      . metFORMIN (GLUCOPHAGE) 1000 MG tablet Take 1,000 mg by mouth 2 (two) times daily with a meal.      . metoprolol tartrate (LOPRESSOR) 50 MG tablet Take 1 tablet (50 mg total) by mouth 2 (two) times daily.  60 tablet  11  . nitroGLYCERIN (NITROSTAT) 0.4 MG SL tablet Place 0.4 mg under the tongue every 5 (five) minutes as needed. As needed for chest pain      . PACERONE 200 MG tablet 1 tab as needed per pt      . potassium chloride SA (K-DUR,KLOR-CON) 20 MEQ tablet Take 20 mEq by mouth every morning.      . warfarin (COUMADIN) 5 MG tablet Take 1-2 tablets (5-10 mg total) by mouth daily. 2 tablets Monday, Wednesday and Friday and 1 tablet all other days.      . [DISCONTINUED] diltiazem (TIAZAC) 420 MG 24 hr capsule Take 420 mg by mouth daily.       History   Social History  . Marital Status: Widowed    Spouse Name: N/A    Number of Children: 1  . Years of Education: N/A   Occupational History  . retired    Social History Main Topics  . Smoking status: Never Smoker   . Smokeless tobacco: Never Used  . Alcohol Use: No  . Drug Use: No  . Sexually Active: No   Other Topics Concern  . Not on file   Social History Narrative   Widow, lives alone at home.    Family History  Problem Relation Age of Onset  . Throat cancer Brother   . Prostate cancer Father   . Diabetes Mother   . Diabetes Brother     multiple  . Heart disease Brother   . Colon cancer Neg Hx     ROS:    Weakness.  Otherwise as stated in the HPI and negative for all  other systems.  Physical Exam: Pulse 88, resp. rate 36, SpO2 100.00%.  GENERAL:  Respiratory distress HEENT:   Pupils equal round and reactive, fundi not visualized, oral mucosa unremarkable NECK:  Jugular venous distention 10 cm at 45 degrees, waveform within normal limits, carotid upstroke brisk and symmetric, no bruits, no thyromegaly LYMPHATICS:  No cervical, inguinal adenopathy LUNGS:  Decreased breath sounds with diffuse crackles.  BACK:  No CVA tenderness CHEST:  Unremarkable HEART:  PMI not displaced or sustained,S1 and S2 within normal limits, no S3, no S4, no clicks, no rubs, no murmurs ABD:  Flat, positive bowel sounds normal in frequency in pitch, no bruits, no rebound, no guarding, no midline pulsatile mass, no hepatomegaly, no splenomegaly EXT:  2 plus pulses throughout, mild bilateral edema, no cyanosis no clubbing SKIN:  No rashes no nodules NEURO:  Cranial nerves II through XII grossly intact, motor grossly intact throughout PSYCH:  Cognitively intact, oriented to person place and time   Labs:   Pending   Radiology:  Diffuse pulmonary edema  EKG:  NSR, RATE 88, poor anterior R wave progression, old anterior MI. No acute ST T wave changes.  05/30/2012  ASSESSMENT AND PLAN:    Acute on chronic diastolic heart failure: I will admit to the ICU and treat with IV Lasix.  She will need BP control.  I will continue to explore her home treatment and in particular try to encourage daily weights and an understanding of PRN Lasix dosing.  Hypertensive urgency: This is being managed in the context of treating his CHF  Tachybrady syndrome: She is maintaining NSR.  She will continue her current therapies.   CAD: I do not suspect an acute coronary syndrome.  I will cycle CKMB and troponin.   SignedRollene Rotunda 05/30/2012, 1:01 AM

## 2012-05-30 NOTE — ED Notes (Signed)
Sob x2 days becoming progressively worse; on breathing tx at time of arrival; tachypneic at 36 breaths per min; remains on breathing tx; EDP and cards at bedside

## 2012-05-30 NOTE — Care Management Note (Addendum)
    Page 1 of 2   06/05/2012     11:49:47 AM   CARE MANAGEMENT NOTE 06/05/2012  Patient:  Alexandria, [name]   Account Number:  [id number]  Date Initiated:  05/30/2012  Documentation initiated by:  Junius Creamer  Subjective/Objective Assessment:   chf     Action/Plan:   lives alone, pcp dr Roe Coombs Christell Constant   Anticipated DC Date:  06/05/2012   Anticipated DC Plan:  HOME W HOME HEALTH SERVICES      DC Planning Services  CM consult      PAC Choice  DURABLE MEDICAL EQUIPMENT  HOME HEALTH   Choice offered to / List presented to:  C-1 Patient   DME arranged  [facility]      DME agency  [facility]     [facility] arranged  HH-1 RN  HH-10 DISEASE MANAGEMENT      HH agency  [facility]   Status of service:  Completed, signed off Medicare Important Message given?   (If response is "NO", the following Medicare IM given date fields will be blank) Date Medicare IM given:   Date Additional Medicare IM given:    Discharge Disposition:  HOME W HOME HEALTH SERVICES  Per UR Regulation:  Reviewed for med. necessity/level of care/duration of stay  If discussed at Long Length of Stay Meetings, dates discussed:    Comments:  06-05-12 [address], [place] [phone] CM did make referral for above services listed. SOC to begin within 24-48 hours post d/c. DME 02 o be delivered to room.   12/4 11am debbie dowell rn,bsn spoke w pt, indep still drives, not homebound. hx of [facility] but not sure she wants that again since indep.  12/3 9:39a debbie dowell rn,bsn [phone]

## 2012-05-30 NOTE — Progress Notes (Signed)
SUBJECTIVE:  Breathing much better.  No chest pain   PHYSICAL EXAM Filed Vitals:   05/30/12 0117 05/30/12 0148 05/30/12 0251 05/30/12 0320  BP: 181/58 128/73 135/94 150/56  Pulse: 85 73 67 66  Temp:  98.8 F (37.1 C) 98.8 F (37.1 C) 98.1 F (36.7 C)  TempSrc:  Core (Comment) Core (Comment) Oral  Resp: 28 26 16 21   Height:    5\' 2"  (1.575 m)  Weight:    178 lb 12.7 oz (81.1 kg)  SpO2: 100% 100% 94% 95%   General:  No distress Lungs:  Diffuse crackles Heart:  RRR Extremities:  Mild edema  LABS: Lab Results  Component Value Date   CKTOTAL 36 05/30/2012   CKMB 3.2 05/30/2012   TROPONINI <0.30 05/30/2012   Results for orders placed during the hospital encounter of 05/30/12 (from the past 24 hour(s))  CBC     Status: Abnormal   Collection Time   05/30/12 12:36 AM      Component Value Range   WBC 12.8 (*) 4.0 - 10.5 K/uL   RBC 4.24  3.87 - 5.11 MIL/uL   Hemoglobin 10.7 (*) 12.0 - 15.0 g/dL   HCT 45.4 (*) 09.8 - 11.9 %   MCV 82.5  78.0 - 100.0 fL   MCH 25.2 (*) 26.0 - 34.0 pg   MCHC 30.6  30.0 - 36.0 g/dL   RDW 14.7 (*) 82.9 - 56.2 %   Platelets 282  150 - 400 K/uL  COMPREHENSIVE METABOLIC PANEL     Status: Abnormal   Collection Time   05/30/12 12:36 AM      Component Value Range   Sodium 135  135 - 145 mEq/L   Potassium 4.2  3.5 - 5.1 mEq/L   Chloride 98  96 - 112 mEq/L   CO2 27  19 - 32 mEq/L   Glucose, Bld 274 (*) 70 - 99 mg/dL   BUN 16  6 - 23 mg/dL   Creatinine, Ser 1.30  0.50 - 1.10 mg/dL   Calcium 86.5  8.4 - 78.4 mg/dL   Total Protein 8.1  6.0 - 8.3 g/dL   Albumin 3.6  3.5 - 5.2 g/dL   AST 15  0 - 37 U/L   ALT 13  0 - 35 U/L   Alkaline Phosphatase 105  39 - 117 U/L   Total Bilirubin 0.3  0.3 - 1.2 mg/dL   GFR calc non Af Amer 85 (*) >90 mL/min   GFR calc Af Amer >90  >90 mL/min  TROPONIN I     Status: Normal   Collection Time   05/30/12 12:36 AM      Component Value Range   Troponin I <0.30  <0.30 ng/mL  PROTIME-INR     Status: Abnormal   Collection Time   05/30/12 12:36 AM      Component Value Range   Prothrombin Time 22.8 (*) 11.6 - 15.2 seconds   INR 2.11 (*) 0.00 - 1.49  POCT I-STAT TROPONIN I     Status: Normal   Collection Time   05/30/12 12:41 AM      Component Value Range   Troponin i, poc 0.01  0.00 - 0.08 ng/mL   Comment 3           URINALYSIS, MICROSCOPIC ONLY     Status: Abnormal   Collection Time   05/30/12  1:20 AM      Component Value Range   Color, Urine YELLOW  YELLOW  APPearance CLOUDY (*) CLEAR   Specific Gravity, Urine 1.017  1.005 - 1.030   pH 7.5  5.0 - 8.0   Glucose, UA 500 (*) NEGATIVE mg/dL   Hgb urine dipstick TRACE (*) NEGATIVE   Bilirubin Urine NEGATIVE  NEGATIVE   Ketones, ur NEGATIVE  NEGATIVE mg/dL   Protein, ur >161 (*) NEGATIVE mg/dL   Urobilinogen, UA 0.2  0.0 - 1.0 mg/dL   Nitrite NEGATIVE  NEGATIVE   Leukocytes, UA NEGATIVE  NEGATIVE   WBC, UA 0-2  <3 WBC/hpf   RBC / HPF 0-2  <3 RBC/hpf   Bacteria, UA MANY (*) RARE   Squamous Epithelial / LPF RARE  RARE  CK TOTAL AND CKMB     Status: Normal   Collection Time   05/30/12  1:34 AM      Component Value Range   Total CK 36  7 - 177 U/L   CK, MB 3.2  0.3 - 4.0 ng/mL   Relative Index RELATIVE INDEX IS INVALID  0.0 - 2.5  CBC WITH DIFFERENTIAL     Status: Abnormal   Collection Time   05/30/12  2:56 AM      Component Value Range   WBC 14.7 (*) 4.0 - 10.5 K/uL   RBC 3.95  3.87 - 5.11 MIL/uL   Hemoglobin 9.8 (*) 12.0 - 15.0 g/dL   HCT 09.6 (*) 04.5 - 40.9 %   MCV 82.0  78.0 - 100.0 fL   MCH 24.8 (*) 26.0 - 34.0 pg   MCHC 30.2  30.0 - 36.0 g/dL   RDW 81.1 (*) 91.4 - 78.2 %   Platelets 254  150 - 400 K/uL   Neutrophils Relative 91 (*) 43 - 77 %   Neutro Abs 13.3 (*) 1.7 - 7.7 K/uL   Lymphocytes Relative 3 (*) 12 - 46 %   Lymphs Abs 0.5 (*) 0.7 - 4.0 K/uL   Monocytes Relative 6  3 - 12 %   Monocytes Absolute 0.9  0.1 - 1.0 K/uL   Eosinophils Relative 0  0 - 5 %   Eosinophils Absolute 0.0  0.0 - 0.7 K/uL   Basophils Relative  0  0 - 1 %   Basophils Absolute 0.0  0.0 - 0.1 K/uL  COMPREHENSIVE METABOLIC PANEL     Status: Abnormal   Collection Time   05/30/12  2:56 AM      Component Value Range   Sodium 137  135 - 145 mEq/L   Potassium 4.3  3.5 - 5.1 mEq/L   Chloride 99  96 - 112 mEq/L   CO2 27  19 - 32 mEq/L   Glucose, Bld 266 (*) 70 - 99 mg/dL   BUN 17  6 - 23 mg/dL   Creatinine, Ser 9.56  0.50 - 1.10 mg/dL   Calcium 21.3  8.4 - 08.6 mg/dL   Total Protein 7.3  6.0 - 8.3 g/dL   Albumin 3.3 (*) 3.5 - 5.2 g/dL   AST 13  0 - 37 U/L   ALT 11  0 - 35 U/L   Alkaline Phosphatase 88  39 - 117 U/L   Total Bilirubin 0.4  0.3 - 1.2 mg/dL   GFR calc non Af Amer 82 (*) >90 mL/min   GFR calc Af Amer >90  >90 mL/min  MAGNESIUM     Status: Normal   Collection Time   05/30/12  2:56 AM      Component Value Range   Magnesium 1.6  1.5 -  2.5 mg/dL  PRO B NATRIURETIC PEPTIDE     Status: Abnormal   Collection Time   05/30/12  2:56 AM      Component Value Range   Pro B Natriuretic peptide (BNP) 1210.0 (*) 0 - 450 pg/mL  TROPONIN I     Status: Normal   Collection Time   05/30/12  2:56 AM      Component Value Range   Troponin I <0.30  <0.30 ng/mL  MRSA PCR SCREENING     Status: Normal   Collection Time   05/30/12  3:24 AM      Component Value Range   MRSA by PCR NEGATIVE  NEGATIVE    Intake/Output Summary (Last 24 hours) at 05/30/12 1914 Last data filed at 05/30/12 0340  Gross per 24 hour  Intake    1.5 ml  Output    875 ml  Net -873.5 ml    ASSESSMENT AND PLAN:  Acute on chronic diastolic heart failure:  Breathing much improved.  Continue IV diuresis.    Hypertensive urgency:  BP is now back to baseline.  NTG off.    CAD:  First troponin negative.  No indication for acute coronary event.  Elevated WBC: Abnormal UA.  I will treat.  Watch INR.  Anemia:  Guaiac stool.  No evidence of bleeding.         Fayrene Fearing [facility] 05/30/2012 6:07 AM

## 2012-05-30 NOTE — ED Notes (Signed)
Kyriaki Moder (neice) [phone]

## 2012-05-30 NOTE — Progress Notes (Signed)
Inpatient Diabetes Program Recommendations  AACE/ADA: New Consensus Statement on Inpatient Glycemic Control (2013)  Target Ranges:  Prepandial:   less than 140 mg/dL      Peak postprandial:   less than 180 mg/dL (1-2 hours)      Critically ill patients:  140 - 180 mg/dL   Reason for Visit: Results for ELMER, BOUTELLE (MRN [id number]) as of 05/30/2012 13:32  Ref. Range 05/30/2012 07:42 05/30/2012 11:11  Glucose-Capillary Latest Range: 70-99 mg/dL 147 (H) 829 (H)   Please check A1C to determine pre-hospitalization glycemic control.  Also, please add Novolog moderate correction tid with meals (in addition to Novolog meal coverage).  Will follow.

## 2012-05-30 NOTE — ED Provider Notes (Signed)
History     CSN: [id number]  Arrival date & time 05/30/12  0031   First MD Initiated Contact with Patient 05/30/12 0037      Chief Complaint  Patient presents with  . Shortness of Breath    (Consider location/radiation/quality/duration/timing/severity/associated sxs/prior treatment) HPI History provided by patient and the EMS. Feeling short of breath worsening over the last 2 days and became severe tonight. Patient has a history of coronary artery disease and CHF, on Coumadin for history of atrial fibrillation. [facility] called code STEMI for concerning EKG in route. Patient was given aspirin, nitroglycerin and albuterol. Her initial blood pressure was reported to be 220/140.  Patient reports minimal improvement in route. Some chest discomfort. No cough or fevers. Symptoms moderate to severe. Past Medical History  Diagnosis Date  . Hypertension     with h/o hypertensive urgency  . Glaucoma(365)   . Tremor, essential   . Diabetes mellitus with neuropathy   . B12 deficiency   . Hyperlipidemia   . Atrial fibrillation     a. on chronic anticoagulation  b. Tiksosyn discontinued and amiodarone  was discontinued secondary to perceived symptoms  . Chronic diastolic heart failure     echo 05/2009: mild LVH, EF 55-60%, grade 2 diast dysfxn, mild LAE, PASP  . CAD (coronary artery disease)     a.  LHC 12/27/11: mLAD 30%, prox-mid D1 90% (small);  CFX patent stent, OM3 small 95%,, mRCA 30%, EF 65%  -  med Rx; b. 07/2011 NSTEMI - PCI/DES LCX - Resolute stent;  . Carotid stenosis     dopplers 6/12: 1-39% bilat ICA  . Pulmonary fibrosis     Dr. Vassie Loll;  patient taken off O2 in 8/12; dyspnea felt CHF>>ILD  . Skin cancer   . Warfarin anticoagulation   . Hypomagnesemia   . Colon polyps   . Syncope     unknown etiology  . Bradycardia   . Myocardial infarction     " very mild :"  . Pacemaker 02/29/2012  . Shortness of breath   . Diabetes mellitus     insulin controled  . CHF (congestive  heart failure)     Past Surgical History  Procedure Date  . Appendectomy   . Nose surgery   . Colonoscopy   . Cardiac catheterization 2013    stent  . Coronary angioplasty with stent placement   . Insert / replace / remove pacemaker 02/29/2012  . Dilation and curettage of uterus     Family History  Problem Relation Age of Onset  . Throat cancer Brother   . Prostate cancer Father   . Diabetes Mother   . Diabetes Brother     multiple  . Heart disease Brother   . Colon cancer Neg Hx     History  Substance Use Topics  . Smoking status: Never Smoker   . Smokeless tobacco: Never Used  . Alcohol Use: No    OB History    Grav Para Term Preterm Abortions TAB SAB Ect Mult Living                  Review of Systems  Constitutional: Negative for fever and chills.  HENT: Negative for neck pain and neck stiffness.   Eyes: Negative for pain.  Respiratory: Positive for shortness of breath.   Cardiovascular: Negative for palpitations.  Gastrointestinal: Negative for abdominal pain.  Genitourinary: Negative for dysuria.  Musculoskeletal: Negative for back pain.  Skin: Negative for rash.  Neurological:  Negative for headaches.  All other systems reviewed and are negative.    Allergies  Ace inhibitors; Crestor; Lipitor; Niaspan; Olmesartan; and Prednisone  Home Medications   Current Outpatient Rx  Name  Route  Sig  Dispense  Refill  . AMLODIPINE BESYLATE 5 MG PO TABS      TAKE ONE TABLET BY MOUTH ONE TIME DAILY   30 tablet   2   . VITAMIN D 1000 UNITS PO CAPS   Oral   Take 1,000 Units by mouth at bedtime. OTC         . CLOPIDOGREL BISULFATE 75 MG PO TABS   Oral   Take 75 mg by mouth daily.         . COLESEVELAM HCL 625 MG PO TABS   Oral   Take 625 mg by mouth at bedtime.          Marland Kitchen VITAMIN B-12 IJ   Injection   Inject 1 application as directed every 30 (thirty) days. Given at the doctor's office at the end of each month         . OMEGA-3 FATTY ACIDS  1000 MG PO CAPS   Oral   Take 1 g by mouth at bedtime.          . FUROSEMIDE 20 MG PO TABS   Oral   Take 1 tablet (20 mg total) by mouth every morning.   30 tablet   6   . INSULIN ASPART 100 UNIT/ML [place] SOLN   Subcutaneous   Inject 10 Units into the skin 3 (three) times daily before meals.         . INSULIN DETEMIR 100 UNIT/ML [place] SOLN   Subcutaneous   Inject 20 Units into the skin every morning.         . ISOSORBIDE MONONITRATE ER 60 MG PO TB24   Oral   Take 60 mg by mouth every morning.          Marland Kitchen LATANOPROST 0.005 % OP SOLN   Both Eyes   Place 1 drop into both eyes at bedtime.          Marland Kitchen MAGNESIUM OXIDE 400 MG PO TABS   Oral   Take 400 mg by mouth 3 (three) times daily.         Marland Kitchen METFORMIN HCL 1000 MG PO TABS   Oral   Take 1,000 mg by mouth 2 (two) times daily with a meal.         . METOPROLOL TARTRATE 50 MG PO TABS   Oral   Take 1 tablet (50 mg total) by mouth 2 (two) times daily.   60 tablet   11   . NITROGLYCERIN 0.4 MG SL SUBL   Sublingual   Place 0.4 mg under the tongue every 5 (five) minutes as needed. As needed for chest pain         . PACERONE 200 MG PO TABS      1 tab as needed per pt         . POTASSIUM CHLORIDE CRYS ER 20 MEQ PO TBCR   Oral   Take 20 mEq by mouth every morning.         . WARFARIN SODIUM 5 MG PO TABS   Oral   Take 1-2 tablets (5-10 mg total) by mouth daily. 2 tablets Monday, Wednesday and Friday and 1 tablet all other days.           Pulse 88  Resp  36  SpO2 100%  Physical Exam  Constitutional: She is oriented to person, place, and time. She appears well-developed and well-nourished.  HENT:  Head: Normocephalic and atraumatic.  Eyes: Conjunctivae normal and EOM are normal. Pupils are equal, round, and reactive to light.  Neck: Trachea normal. Neck supple. No thyromegaly present.  Cardiovascular: S1 normal, S2 normal, intact distal pulses and normal pulses.     No systolic murmur is present   No  diastolic murmur is present  Pulses:      Radial pulses are 2+ on the right side, and 2+ on the left side.       tachycardic  Pulmonary/Chest: She has no rhonchi.       Moderate respiratory distress with tachypnea and decreased bilateral breath sounds. Some mildly coarse breath sounds posteriorly, absent lung sounds at bases.  Abdominal: Soft. Normal appearance and bowel sounds are normal. There is no tenderness. There is no CVA tenderness and negative Murphy's sign.  Musculoskeletal:       BLE;s 1 plus symetric pretibial edema with calves nontender, no cords or erythema, negative Homans sign  Neurological: She is alert and oriented to person, place, and time. She has normal strength. No cranial nerve deficit or sensory deficit. GCS eye subscore is 4. GCS verbal subscore is 5. GCS motor subscore is 6.  Skin: Skin is warm and dry. No rash noted. She is not diaphoretic.  Psychiatric: Her speech is normal.    ED Course  Procedures (including critical care time)  Results for orders placed during the hospital encounter of 05/30/12  CBC      Component Value Range   WBC 12.8 (*) 4.0 - 10.5 K/uL   RBC 4.24  3.87 - 5.11 MIL/uL   Hemoglobin 10.7 (*) 12.0 - 15.0 g/dL   HCT 40.9 (*) 81.1 - 91.4 %   MCV 82.5  78.0 - 100.0 fL   MCH 25.2 (*) 26.0 - 34.0 pg   MCHC 30.6  30.0 - 36.0 g/dL   RDW 78.2 (*) 95.6 - 21.3 %   Platelets 282  150 - 400 K/uL  COMPREHENSIVE METABOLIC PANEL      Component Value Range   Sodium 135  135 - 145 mEq/L   Potassium 4.2  3.5 - 5.1 mEq/L   Chloride 98  96 - 112 mEq/L   CO2 27  19 - 32 mEq/L   Glucose, Bld 274 (*) 70 - 99 mg/dL   BUN 16  6 - 23 mg/dL   Creatinine, Ser 0.86  0.50 - 1.10 mg/dL   Calcium 57.8  8.4 - 46.9 mg/dL   Total Protein 8.1  6.0 - 8.3 g/dL   Albumin 3.6  3.5 - 5.2 g/dL   AST 15  0 - 37 U/L   ALT 13  0 - 35 U/L   Alkaline Phosphatase 105  39 - 117 U/L   Total Bilirubin 0.3  0.3 - 1.2 mg/dL   GFR calc non Af Amer 85 (*) >90 mL/min   GFR calc  Af Amer >90  >90 mL/min  TROPONIN I      Component Value Range   Troponin I <0.30  <0.30 ng/mL  PROTIME-INR      Component Value Range   Prothrombin Time 22.8 (*) 11.6 - 15.2 seconds   INR 2.11 (*) 0.00 - 1.49  POCT I-STAT TROPONIN I      Component Value Range   Troponin i, poc 0.01  0.00 - 0.08 ng/mL  Comment 3            Dg Chest Portable 1 View  05/30/2012  *RADIOLOGY REPORT*  Clinical Data: Shortness of breath.  PORTABLE CHEST - 1 VIEW  Comparison: 05/09/2012  Findings: Worsening of diffuse symmetric airspace disease is seen, consistent with increased pulmonary edema.  Cardiomegaly remains stable.  The dual lead transvenous pacemaker remains in appropriate position.  IMPRESSION:  1.  Worsening diffuse symmetric airspace disease/edema. 2.  Stable cardiomegaly.   Original Report Authenticated By: Myles Rosenthal, M.D.      Date: 05/30/2012  Rate: 88  Rhythm: normal sinus rhythm  QRS Axis: normal  Intervals: PR prolonged  ST/T Wave abnormalities: nonspecific ST changes  Conduction Disutrbances:first-degree A-V block  and nonspecific intraventricular conduction delay  Narrative Interpretation:   Old EKG Reviewed: none available  CRITICAL CARE Performed by: Jezreel Justiniano   Total critical care time: 30  Critical care time was exclusive of separately billable procedures and treating other patients.  Critical care was necessary to treat or prevent imminent or life-threatening deterioration.  Critical care was time spent personally by me on the following activities: development of treatment plan with patient and/or surrogate as well as nursing, discussions with consultants, evaluation of patient's response to treatment, examination of patient, obtaining history from patient or surrogate, ordering and performing treatments and interventions, ordering and review of laboratory studies, ordering and review of radiographic studies, pulse oximetry and re-evaluation of patient's condition. IV NTG  drip. CAR consult/ bedside - Dr Antoine Poche to admit acute CHF exacerbation. Holding BiPAP for now with sats adequate NRM. Improving condition and BP with NTG drip.serial evaluations. Family updated bedside.   MDM  SOB clinical heart failure, improving with NTg drip. Labs. CXR. ASA PTA. CAR admit  Patient initially called code STEMI in route and canceled on arrival after EKG reviewed.  Cardiology bedside Dr. Antoine Poche follows patient and will admit for presumptive new onset CHF with pulmonary edema.  CXR. Labs. ASA PTA. IV NTG. Oxygen provided. IV Lasix. IV morphine.      Sunnie Nielsen, MD 05/30/12 [id number]

## 2012-05-31 ENCOUNTER — Encounter: Payer: Medicare Other | Admitting: Internal Medicine

## 2012-05-31 LAB — CBC
MCH: 25.8 pg — ABNORMAL LOW (ref 26.0–34.0)
MCV: 83.3 fL (ref 78.0–100.0)
Platelets: 214 10*3/uL (ref 150–400)
RBC: 3.6 MIL/uL — ABNORMAL LOW (ref 3.87–5.11)

## 2012-05-31 LAB — GLUCOSE, CAPILLARY
Glucose-Capillary: 149 mg/dL — ABNORMAL HIGH (ref 70–99)
Glucose-Capillary: 158 mg/dL — ABNORMAL HIGH (ref 70–99)
Glucose-Capillary: 154 mg/dL — ABNORMAL HIGH (ref 70–99)
Glucose-Capillary: 173 mg/dL — ABNORMAL HIGH (ref 70–99)

## 2012-05-31 LAB — BASIC METABOLIC PANEL
CO2: 33 mEq/L — ABNORMAL HIGH (ref 19–32)
Chloride: 95 mEq/L — ABNORMAL LOW (ref 96–112)
Sodium: 134 mEq/L — ABNORMAL LOW (ref 135–145)

## 2012-05-31 LAB — PROTIME-INR
INR: 3.2 — ABNORMAL HIGH (ref 0.00–1.49)
Prothrombin Time: 31 seconds — ABNORMAL HIGH (ref 11.6–15.2)

## 2012-05-31 MED ORDER — WARFARIN - PHARMACIST DOSING INPATIENT: Freq: Every day | Status: DC

## 2012-05-31 NOTE — Progress Notes (Signed)
Inpatient Diabetes Program Recommendations  AACE/ADA: New Consensus Statement on Inpatient Glycemic Control (2013)  Target Ranges:  Prepandial:   less than 140 mg/dL      Peak postprandial:   less than 180 mg/dL (1-2 hours)      Critically ill patients:  140 - 180 mg/dL   Reason for Visit: Note hypoglycemic event on 05/30/12.  Please decrease Novolog meal coverage to 6 units tid with meals.  Also please add Novolog sensitive correction tid with meals.  Will follow.

## 2012-05-31 NOTE — Progress Notes (Signed)
SUBJECTIVE:  Breathing much better but not at baseline.  No chest pain   PHYSICAL EXAM Filed Vitals:   05/31/12 0300 05/31/12 0400 05/31/12 0435 05/31/12 0600  BP: 130/95 140/50  153/55  Pulse: 60 62  73  Temp:  98.6 F (37 C)    TempSrc:  Oral    Resp:      Height:      Weight:   175 lb 11.3 oz (79.7 kg)   SpO2: 96% 97%  99%   General:  No distress Lungs:  Diffuse crackles improved Heart:  RRR Extremities:  Trace edema  LABS: Lab Results  Component Value Date   CKTOTAL 27 05/30/2012   CKMB 3.0 05/30/2012   TROPONINI <0.30 05/30/2012   Results for orders placed during the hospital encounter of 05/30/12 (from the past 24 hour(s))  GLUCOSE, CAPILLARY     Status: Abnormal   Collection Time   05/30/12  7:42 AM      Component Value Range   Glucose-Capillary 169 (*) 70 - 99 mg/dL  CK TOTAL AND CKMB     Status: Normal   Collection Time   05/30/12  8:05 AM      Component Value Range   Total CK 27  7 - 177 U/L   CK, MB 3.0  0.3 - 4.0 ng/mL   Relative Index RELATIVE INDEX IS INVALID  0.0 - 2.5  TROPONIN I     Status: Normal   Collection Time   05/30/12  8:05 AM      Component Value Range   Troponin I <0.30  <0.30 ng/mL  GLUCOSE, CAPILLARY     Status: Abnormal   Collection Time   05/30/12 11:11 AM      Component Value Range   Glucose-Capillary 207 (*) 70 - 99 mg/dL  TROPONIN I     Status: Normal   Collection Time   05/30/12  2:50 PM      Component Value Range   Troponin I <0.30  <0.30 ng/mL  GLUCOSE, CAPILLARY     Status: Abnormal   Collection Time   05/30/12  7:33 PM      Component Value Range   Glucose-Capillary 44 (*) 70 - 99 mg/dL   Comment 1 Documented in Chart     Comment 2 Notify RN    GLUCOSE, CAPILLARY     Status: Abnormal   Collection Time   05/30/12  7:54 PM      Component Value Range   Glucose-Capillary 62 (*) 70 - 99 mg/dL   Comment 1 Documented in Chart     Comment 2 Notify RN    GLUCOSE, CAPILLARY     Status: Normal   Collection Time   05/30/12   8:23 PM      Component Value Range   Glucose-Capillary 94  70 - 99 mg/dL   Comment 1 Documented in Chart     Comment 2 Notify RN    GLUCOSE, CAPILLARY     Status: Abnormal   Collection Time   05/30/12  9:34 PM      Component Value Range   Glucose-Capillary 132 (*) 70 - 99 mg/dL   Comment 1 Notify RN     Comment 2 Documented in Chart    GLUCOSE, CAPILLARY     Status: Abnormal   Collection Time   05/31/12  4:31 AM      Component Value Range   Glucose-Capillary 149 (*) 70 - 99 mg/dL   Comment 1 Documented  in Chart     Comment 2 Notify RN    BASIC METABOLIC PANEL     Status: Abnormal   Collection Time   05/31/12  5:20 AM      Component Value Range   Sodium 134 (*) 135 - 145 mEq/L   Potassium 4.0  3.5 - 5.1 mEq/L   Chloride 95 (*) 96 - 112 mEq/L   CO2 33 (*) 19 - 32 mEq/L   Glucose, Bld 131 (*) 70 - 99 mg/dL   BUN 19  6 - 23 mg/dL   Creatinine, Ser 1.61  0.50 - 1.10 mg/dL   Calcium 09.6  8.4 - 04.5 mg/dL   GFR calc non Af Amer 78 (*) >90 mL/min   GFR calc Af Amer 90 (*) >90 mL/min  PROTIME-INR     Status: Abnormal   Collection Time   05/31/12  5:20 AM      Component Value Range   Prothrombin Time 31.0 (*) 11.6 - 15.2 seconds   INR 3.20 (*) 0.00 - 1.49    Intake/Output Summary (Last 24 hours) at 05/31/12 0641 Last data filed at 05/31/12 0434  Gross per 24 hour  Intake  811.5 ml  Output   3161 ml  Net -2349.5 ml    ASSESSMENT AND PLAN:  Acute on chronic diastolic heart failure:  Breathing much improved.  However, with continued crackles I will continue IV diuresis.    Hypertensive urgency:  BP is now back to baseline.  NTG off.  Continue current meds.  CAD:  Troponins negative.  No indication for acute coronary event.  Elevated WBC: Abnormal UA.  Day 2 of Cipro.  I will repeat a CBC  Anemia:  Guaiac stool pending.  No evidence of bleeding.  Repeat CBC  Diabetes: Per diabetes guidelines I will check an A1C and add Novolog moderate correction.   Fayrene Fearing  [facility] 05/31/2012 6:41 AM

## 2012-05-31 NOTE — Progress Notes (Signed)
Chaplain visited patient during a routine rounds on the Unit. Patient was awake and responsive. Patient expressed improvement in her health condition. Chaplain provided ministry of presence and empathic listening. Chaplain shared word of hope and encouragement with patient. Chaplain will continue to provide spiritual care as needed to patient. Patient expressed appreciation for Chaplain's visit and support.

## 2012-05-31 NOTE — Progress Notes (Signed)
ANTICOAGULATION CONSULT NOTE - Initial Consult  Pharmacy Consult for Coumadin Indication: h/o Afib  Allergies  Allergen Reactions  . Ace Inhibitors Cough    Enalapril  benicor    . Crestor (Rosuvastatin Calcium)     Fatigue and leg pain   . Lipitor (Atorvastatin Calcium)     Myalgias   . Niaspan (Niacin)     Intol   . Olmesartan Cough  . Prednisone     REACTION: "retained fluid"    Patient Measurements: Height: 5\' 2"  (157.5 cm) Weight: 175 lb 11.3 oz (79.7 kg) IBW/kg (Calculated) : 50.1   Vital Signs: Temp: 98.6 F (37 C) (12/04 0400) Temp src: Oral (12/04 0400) BP: 153/55 mmHg (12/04 0600) Pulse Rate: 73  (12/04 0600)  Labs:  Basename 05/31/12 0520 05/30/12 1450 05/30/12 0805 05/30/12 0256 05/30/12 0134 05/30/12 0036  HGB -- -- -- 9.8* -- 10.7*  HCT -- -- -- 32.4* -- 35.0*  PLT -- -- -- 254 -- 282  APTT -- -- -- -- -- --  LABPROT 31.0* -- -- -- -- 22.8*  INR 3.20* -- -- -- -- 2.11*  HEPARINUNFRC -- -- -- -- -- --  CREATININE 0.76 -- -- 0.65 -- 0.58  CKTOTAL -- -- 27 -- 36 --  CKMB -- -- 3.0 -- 3.2 --  TROPONINI -- <0.30 <0.30 <0.30 -- --    Estimated Creatinine Clearance: 54.8 ml/min (by C-G formula based on Cr of 0.76).   Medical History: Past Medical History  Diagnosis Date  . Hypertension     with h/o hypertensive urgency  . Glaucoma(365)   . Tremor, essential   . Diabetes mellitus with neuropathy   . B12 deficiency   . Hyperlipidemia   . Atrial fibrillation     a. on chronic anticoagulation  b. Tiksosyn discontinued and amiodarone  was discontinued secondary to perceived symptoms  . Chronic diastolic heart failure     echo 05/2009: mild LVH, EF 55-60%, grade 2 diast dysfxn, mild LAE, PASP  . CAD (coronary artery disease)     a.  LHC 12/27/11: mLAD 30%, prox-mid D1 90% (small);  CFX patent stent, OM3 small 95%,, mRCA 30%, EF 65%  -  med Rx; b. 07/2011 NSTEMI - PCI/DES LCX - Resolute stent;  . Carotid stenosis     dopplers 6/12: 1-39% bilat ICA   . Pulmonary fibrosis     Dr. Vassie Loll;  patient taken off O2 in 8/12; dyspnea felt CHF>>ILD  . Skin cancer   . Warfarin anticoagulation   . Hypomagnesemia   . Colon polyps   . Syncope     unknown etiology  . Bradycardia   . Myocardial infarction     " very mild :"  . Pacemaker 02/29/2012  . Shortness of breath   . Diabetes mellitus     insulin controled  . CHF (congestive heart failure)     Medications:  Norvasc  Cipro  Plavix  Welchol  Lasix  Levemir  Xalatan  Metformin  Lopressor Most recent home Coumadin regimen:  Coumadin 2.5 mg daily except 5 mg MonFri  Assessment: 78 yo female admitted with CHF,  h/o Afib on Coumadin Large increase in INR today from yesterday  Goal of Therapy:  INR 2-3 Monitor platelets by anticoagulation protocol: Yes   Plan:  No Coumadin today F/U daily INR  Terez Montee, Gary Fleet 05/31/2012,7:14 AM

## 2012-06-01 LAB — BASIC METABOLIC PANEL
GFR calc Af Amer: 89 mL/min — ABNORMAL LOW (ref >90)
GFR calc non Af Amer: 77 mL/min — ABNORMAL LOW (ref >90)
Glucose, Bld: 174 mg/dL — ABNORMAL HIGH (ref 70–99)
Potassium: 3.7 mEq/L (ref 3.5–5.1)
Sodium: 137 mEq/L (ref 135–145)

## 2012-06-01 LAB — GLUCOSE, CAPILLARY

## 2012-06-01 LAB — PROTIME-INR: INR: 2.04 — ABNORMAL HIGH (ref 0.00–1.49)

## 2012-06-01 MED ORDER — FUROSEMIDE 40 MG PO TABS
40.0000 mg | ORAL_TABLET | Freq: Two times a day (BID) | ORAL | Status: DC
  Administered 2012-06-01 – 2012-06-02 (×3): 40 mg via ORAL
  Filled 2012-06-01 (×5): qty 1

## 2012-06-01 MED ORDER — WARFARIN SODIUM 2.5 MG PO TABS
2.5000 mg | ORAL_TABLET | Freq: Once | ORAL | Status: AC
  Administered 2012-06-01: 2.5 mg via ORAL
  Filled 2012-06-01: qty 1

## 2012-06-01 NOTE — Progress Notes (Signed)
SUBJECTIVE:  Dyspnea much improved; no chest pain   PHYSICAL EXAM Filed Vitals:   05/31/12 1958 06/01/12 0054 06/01/12 0436 06/01/12 0441  BP: 144/53 145/53 145/52   Pulse:      Temp: 98.3 F (36.8 C) 98 F (36.7 C) 98.1 F (36.7 C)   TempSrc: Oral Oral Oral   Resp:  18 18   Height:      Weight:    164 lb 0.4 oz (74.4 kg)  SpO2: 98% 95% 94%    General:  No distress Neck: supple Lungs:  Basilar crackles Heart:  RRR Abd: soft, NT/ND Extremities:  Trace edema  LABS: Lab Results  Component Value Date   CKTOTAL 27 05/30/2012   CKMB 3.0 05/30/2012   TROPONINI <0.30 05/30/2012   Results for orders placed during the hospital encounter of 05/30/12 (from the past 24 hour(s))  GLUCOSE, CAPILLARY     Status: Abnormal   Collection Time   05/31/12  7:52 AM      Component Value Range   Glucose-Capillary 139 (*) 70 - 99 mg/dL  CBC     Status: Abnormal   Collection Time   05/31/12  9:08 AM      Component Value Range   WBC 7.4  4.0 - 10.5 K/uL   RBC 3.60 (*) 3.87 - 5.11 MIL/uL   Hemoglobin 9.3 (*) 12.0 - 15.0 g/dL   HCT 16.1 (*) 09.6 - 04.5 %   MCV 83.3  78.0 - 100.0 fL   MCH 25.8 (*) 26.0 - 34.0 pg   MCHC 31.0  30.0 - 36.0 g/dL   RDW 40.9 (*) 81.1 - 91.4 %   Platelets 214  150 - 400 K/uL  HEMOGLOBIN A1C     Status: Abnormal   Collection Time   05/31/12  9:08 AM      Component Value Range   Hemoglobin A1C 7.6 (*) <5.7 %   Mean Plasma Glucose 171 (*) <117 mg/dL  GLUCOSE, CAPILLARY     Status: Abnormal   Collection Time   05/31/12 10:12 AM      Component Value Range   Glucose-Capillary 158 (*) 70 - 99 mg/dL  GLUCOSE, CAPILLARY     Status: Abnormal   Collection Time   05/31/12 11:29 AM      Component Value Range   Glucose-Capillary 154 (*) 70 - 99 mg/dL  GLUCOSE, CAPILLARY     Status: Normal   Collection Time   05/31/12  4:05 PM      Component Value Range   Glucose-Capillary 81  70 - 99 mg/dL  GLUCOSE, CAPILLARY     Status: Abnormal   Collection Time   05/31/12  9:51 PM       Component Value Range   Glucose-Capillary 173 (*) 70 - 99 mg/dL   Comment 1 Documented in Chart     Comment 2 Notify RN    BASIC METABOLIC PANEL     Status: Abnormal   Collection Time   06/01/12  5:10 AM      Component Value Range   Sodium 137  135 - 145 mEq/L   Potassium 3.7  3.5 - 5.1 mEq/L   Chloride 95 (*) 96 - 112 mEq/L   CO2 33 (*) 19 - 32 mEq/L   Glucose, Bld 174 (*) 70 - 99 mg/dL   BUN 21  6 - 23 mg/dL   Creatinine, Ser 7.82  0.50 - 1.10 mg/dL   Calcium 95.6  8.4 - 21.3 mg/dL  GFR calc non Af Amer 77 (*) >90 mL/min   GFR calc Af Amer 89 (*) >90 mL/min  PROTIME-INR     Status: Abnormal   Collection Time   06/01/12  5:10 AM      Component Value Range   Prothrombin Time 22.2 (*) 11.6 - 15.2 seconds   INR 2.04 (*) 0.00 - 1.49    Intake/Output Summary (Last 24 hours) at 06/01/12 1610 Last data filed at 06/01/12 0400  Gross per 24 hour  Intake    770 ml  Output   3700 ml  Net  -2930 ml    ASSESSMENT AND PLAN:  Acute on chronic diastolic heart failure:  CHF is improving; change lasix to 40 mg po BID and follow renal function; transfer to telemetry  Hypertensive urgency:  Continue current meds.  CAD:  Troponins negative.  No indication for acute coronary event.  ?UTI: Continue ciprofloxacin   Anemia:  Outpatient eval following DC.  Diabetes:    Olga Millers 06/01/2012 7:24 AM

## 2012-06-01 NOTE — Progress Notes (Addendum)
ANTICOAGULATION CONSULT NOTE  Pharmacy Consult for Coumadin Indication: h/o Afib  Allergies  Allergen Reactions  . Ace Inhibitors Cough    Enalapril  benicor    . Crestor (Rosuvastatin Calcium)     Fatigue and leg pain   . Lipitor (Atorvastatin Calcium)     Myalgias   . Niaspan (Niacin)     Intol   . Olmesartan Cough  . Prednisone     REACTION: "retained fluid"   Patient Measurements: Height: 5\' 2"  (157.5 cm) Weight: 164 lb 0.4 oz (74.4 kg) IBW/kg (Calculated) : 50.1   Vital Signs: Temp: 97.5 F (36.4 C) (12/05 0814) Temp src: Axillary (12/05 0814) BP: 124/46 mmHg (12/05 0814)  Labs:  Basename 06/01/12 0510 05/31/12 0908 05/31/12 0520 05/30/12 1450 05/30/12 0805 05/30/12 0256 05/30/12 0134 05/30/12 0036  HGB -- 9.3* -- -- -- 9.8* -- --  HCT -- 30.0* -- -- -- 32.4* -- 35.0*  PLT -- 214 -- -- -- 254 -- 282  APTT -- -- -- -- -- -- -- --  LABPROT 22.2* -- 31.0* -- -- -- -- 22.8*  INR 2.04* -- 3.20* -- -- -- -- 2.11*  HEPARINUNFRC -- -- -- -- -- -- -- --  CREATININE 0.79 -- 0.76 -- -- 0.65 -- --  CKTOTAL -- -- -- -- 27 -- 36 --  CKMB -- -- -- -- 3.0 -- 3.2 --  TROPONINI -- -- -- <0.30 <0.30 <0.30 -- --   Estimated Creatinine Clearance: 52.9 ml/min (by C-G formula based on Cr of 0.79).  Medical History: Past Medical History  Diagnosis Date  . Hypertension     with h/o hypertensive urgency  . Glaucoma(365)   . Tremor, essential   . Diabetes mellitus with neuropathy   . B12 deficiency   . Hyperlipidemia   . Atrial fibrillation     a. on chronic anticoagulation  b. Tiksosyn discontinued and amiodarone  was discontinued secondary to perceived symptoms  . Chronic diastolic heart failure     echo 05/2009: mild LVH, EF 55-60%, grade 2 diast dysfxn, mild LAE, PASP  . CAD (coronary artery disease)     a.  LHC 12/27/11: mLAD 30%, prox-mid D1 90% (small);  CFX patent stent, OM3 small 95%,, mRCA 30%, EF 65%  -  med Rx; b. 07/2011 NSTEMI - PCI/DES LCX - Resolute stent;  .  Carotid stenosis     dopplers 6/12: 1-39% bilat ICA  . Pulmonary fibrosis     Dr. Vassie Loll;  patient taken off O2 in 8/12; dyspnea felt CHF>>ILD  . Skin cancer   . Warfarin anticoagulation   . Hypomagnesemia   . Colon polyps   . Syncope     unknown etiology  . Bradycardia   . Myocardial infarction     " very mild :"  . Pacemaker 02/29/2012  . Shortness of breath   . Diabetes mellitus     insulin controled  . CHF (congestive heart failure)    Medications:  Norvasc  Cipro  Plavix  Welchol  Lasix  Levemir  Xalatan  Metformin  Lopressor  HOME Coumadin regimen:  Coumadin 5 mg daily except 10 mg on Monday and Friday.  Drug/Drug interactions:  Warfarin + Cipro  Fluoroquinolones have been reported to enhance the effect of warfarin, and appropriate laboratory tests should be routinely monitored.[12] The exact mechanism of the warfarin-ciprofloxacin interaction is unknown. Ciprofloxacin is postulated to affect gut flora, displace warfarin from albumin, and interfere with hepatic metabolism by inhibiting the cytochrome P-450  enzyme system.[13,14]  Product information: Cipro (ciprofloxacin). [address], [place]: [employer], [address]10.  Assessment: 78 yo female admitted with CHF,  h/o Afib on Coumadin INR is 2.0 today and she is without noted bleeding complications.  H/H is stable and platelets were 214 yesterday.  She dropped significantly after holding dose, so not sure if initial level was accurate.   Goal of Therapy:  INR 2-3 Monitor platelets by anticoagulation protocol: Yes   Plan:   Warfarin 2.5 mg today  F/U PT/INR in the morning  Monitor for bleeding complications.  Monitor for drug/drug interacting effects of Fluroquinolones - consider length of therapy and discontinue as soon as possible.  Nadara Mustard, PharmD., MS Clinical Pharmacist Pager:  [phone] Thank you for allowing pharmacy to be part of this patients care team. 06/01/2012,10:06 AM

## 2012-06-01 NOTE — Progress Notes (Signed)
Chaplain responded to page for code STEMI. Pt was treated in Trauma C and code was cancelled. Pt's daughter and great niece were with her at bedside. Pt said she had had great difficulty breathing at home but was now breathing comfortably (with help of breathing mask). She will stay overnight in 2900. They thanked chaplain for his concern.

## 2012-06-02 LAB — BASIC METABOLIC PANEL
CO2: 32 mEq/L (ref 19–32)
Calcium: 10.5 mg/dL (ref 8.4–10.5)
Creatinine, Ser: 0.87 mg/dL (ref 0.50–1.10)
Glucose, Bld: 172 mg/dL — ABNORMAL HIGH (ref 70–99)

## 2012-06-02 MED ORDER — FUROSEMIDE 10 MG/ML IJ SOLN
80.0000 mg | Freq: Two times a day (BID) | INTRAMUSCULAR | Status: DC
  Administered 2012-06-02 – 2012-06-04 (×5): 80 mg via INTRAVENOUS
  Filled 2012-06-02 (×5): qty 8

## 2012-06-02 MED ORDER — ALPRAZOLAM 0.25 MG PO TABS: 0.2500 mg | ORAL_TABLET | Freq: Two times a day (BID) | ORAL | Status: DC | PRN

## 2012-06-02 MED ORDER — WARFARIN SODIUM 10 MG PO TABS
10.0000 mg | ORAL_TABLET | Freq: Once | ORAL | Status: AC
  Administered 2012-06-02: 10 mg via ORAL
  Filled 2012-06-02: qty 1

## 2012-06-02 NOTE — Progress Notes (Signed)
SUBJECTIVE:  She did not sleep well last night.  Her sats were 88% on room air last night.  She is back on O2.     PHYSICAL EXAM Filed Vitals:   06/01/12 1604 06/01/12 1744 06/01/12 2208 06/02/12 0700  BP: 147/63  155/66 135/62  Pulse: 67  79 98  Temp: 99.2 F (37.3 C)  98 F (36.7 C) 98.6 F (37 C)  TempSrc: Oral     Resp: 18  20 18   Height:      Weight:      SpO2: 85% 94% 92% 90%   General:  No distress Lungs:  Diffuse crackles improved (she has had some baseline dry crackles in the past) Heart:  RRR Extremities:  Trace edema  LABS: Lab Results  Component Value Date   CKTOTAL 27 05/30/2012   CKMB 3.0 05/30/2012   TROPONINI <0.30 05/30/2012   Results for orders placed during the hospital encounter of 05/30/12 (from the past 24 hour(s))  GLUCOSE, CAPILLARY     Status: Abnormal   Collection Time   06/01/12  5:18 PM      Component Value Range   Glucose-Capillary 203 (*) 70 - 99 mg/dL  GLUCOSE, CAPILLARY     Status: Normal   Collection Time   06/01/12  9:58 PM      Component Value Range   Glucose-Capillary 83  70 - 99 mg/dL   Comment 1 Notify RN     Comment 2 Documented in Chart    BASIC METABOLIC PANEL     Status: Abnormal   Collection Time   06/02/12  5:52 AM      Component Value Range   Sodium 136  135 - 145 mEq/L   Potassium 4.0  3.5 - 5.1 mEq/L   Chloride 95 (*) 96 - 112 mEq/L   CO2 32  19 - 32 mEq/L   Glucose, Bld 172 (*) 70 - 99 mg/dL   BUN 25 (*) 6 - 23 mg/dL   Creatinine, Ser 1.61  0.50 - 1.10 mg/dL   Calcium 09.6  8.4 - 04.5 mg/dL   GFR calc non Af Amer 61 (*) >90 mL/min   GFR calc Af Amer 71 (*) >90 mL/min  PROTIME-INR     Status: Abnormal   Collection Time   06/02/12  5:52 AM      Component Value Range   Prothrombin Time 17.2 (*) 11.6 - 15.2 seconds   INR 1.44  0.00 - 1.49  GLUCOSE, CAPILLARY     Status: Abnormal   Collection Time   06/02/12  7:27 AM      Component Value Range   Glucose-Capillary 183 (*) 70 - 99 mg/dL    Intake/Output Summary  (Last 24 hours) at 06/02/12 0834 Last data filed at 06/02/12 0430  Gross per 24 hour  Intake    483 ml  Output      0 ml  Net    483 ml    ASSESSMENT AND PLAN:  Acute on chronic diastolic heart failure:  Breathing probably not at baseline.  On PO diuretics.  I/O incomplete today.  Wt is down 14 lbs. She is very difficult to evaluate as she has some baseline dyspnea and baseline dry crackles.  However, she has not been on O2 at home in quite some time so the decreased sats are new.  I will go back to the IV Lasix again today.  Probably back to PO in the am and possibly home on Monday.  Hypertensive urgency:  BP is now back to baseline.  Continue current meds.  CAD:  Troponins negative.  No indication for acute coronary event.  Elevated WBC: Abnormal UA.  Day 4 of Cipro. WBC down.   Anemia:  Guaiac stool pending.  No evidence of bleeding.  CBC was slightly lower yesterday.  Guaiac has been ordered.  We will likely follow this as an outpatient.   Diabetes: A1C was 7.6.  Plan per her primary outpatient MD.    [name] 06/02/2012 8:34 AM

## 2012-06-02 NOTE — Progress Notes (Signed)
ANTICOAGULATION CONSULT NOTE  Pharmacy Consult for Coumadin Indication: h/o Afib  Allergies  Allergen Reactions  . Ace Inhibitors Cough    Enalapril  benicor    . Crestor (Rosuvastatin Calcium)     Fatigue and leg pain   . Lipitor (Atorvastatin Calcium)     Myalgias   . Niaspan (Niacin)     Intol   . Olmesartan Cough  . Prednisone     REACTION: "retained fluid"   Patient Measurements: Height: 5\' 2"  (157.5 cm) Weight: 164 lb 0.4 oz (74.4 kg) IBW/kg (Calculated) : 50.1   Vital Signs: Temp: 98.6 F (37 C) (12/06 0700) BP: 135/62 mmHg (12/06 0700) Pulse Rate: 98  (12/06 0700)  Labs:  Basename 06/02/12 0552 06/01/12 0510 05/31/12 0908 05/31/12 0520 05/30/12 1450  HGB -- -- 9.3* -- --  HCT -- -- 30.0* -- --  PLT -- -- 214 -- --  APTT -- -- -- -- --  LABPROT 17.2* 22.2* -- 31.0* --  INR 1.44 2.04* -- 3.20* --  HEPARINUNFRC -- -- -- -- --  CREATININE 0.87 0.79 -- 0.76 --  CKTOTAL -- -- -- -- --  CKMB -- -- -- -- --  TROPONINI -- -- -- -- <0.30   Estimated Creatinine Clearance: 48.7 ml/min (by C-G formula based on Cr of 0.87).  Medical History: Past Medical History  Diagnosis Date  . Hypertension     with h/o hypertensive urgency  . Glaucoma(365)   . Tremor, essential   . Diabetes mellitus with neuropathy   . B12 deficiency   . Hyperlipidemia   . Atrial fibrillation     a. on chronic anticoagulation  b. Tiksosyn discontinued and amiodarone  was discontinued secondary to perceived symptoms  . Chronic diastolic heart failure     echo 05/2009: mild LVH, EF 55-60%, grade 2 diast dysfxn, mild LAE, PASP  . CAD (coronary artery disease)     a.  LHC 12/27/11: mLAD 30%, prox-mid D1 90% (small);  CFX patent stent, OM3 small 95%,, mRCA 30%, EF 65%  -  med Rx; b. 07/2011 NSTEMI - PCI/DES LCX - Resolute stent;  . Carotid stenosis     dopplers 6/12: 1-39% bilat ICA  . Pulmonary fibrosis     Dr. Vassie Loll;  patient taken off O2 in 8/12; dyspnea felt CHF>>ILD  . Skin cancer   .  Warfarin anticoagulation   . Hypomagnesemia   . Colon polyps   . Syncope     unknown etiology  . Bradycardia   . Myocardial infarction     " very mild :"  . Pacemaker 02/29/2012  . Shortness of breath   . Diabetes mellitus     insulin controled  . CHF (congestive heart failure)     HOME Coumadin regimen:  Coumadin 5 mg daily except 10 mg on Monday and Friday.  Assessment: 78 yo female admitted with CHF,  h/o Afib on Coumadin (5 mg daily except 10 mg on Monday and Friday PTA). INR has continued to drop and is subtherapeutic at 1.44 today. This is due to dose held for supratherapeutic INR, and conservative dose given on follow-up. No bleeding complications noted. H/H is stable and platelets were 214 on 12/4.   Noted patient on is concurrent Cipro for abnormal UA. She remains afebrile and WBC is nml. No urine cx done. Today is day # 4 of Cipro.  Goal of Therapy:  INR 2-3 Monitor platelets by anticoagulation protocol: Yes   Plan:  - Give Coumadin 10mg   PO x 1 today - Will f/up daily INR - Consider d/c Cipro if patient is asymptomatic.  Thanks, Markita Stcharles K. Allena Katz, PharmD, BCPS.  Clinical Pharmacist Pager [phone]. 06/02/2012 10:04 AM

## 2012-06-03 DIAGNOSIS — I4891 Unspecified atrial fibrillation: Secondary | ICD-10-CM

## 2012-06-03 LAB — BASIC METABOLIC PANEL
BUN: 27 mg/dL — ABNORMAL HIGH (ref 6–23)
CO2: 34 mEq/L — ABNORMAL HIGH (ref 19–32)
Chloride: 93 mEq/L — ABNORMAL LOW (ref 96–112)
Creatinine, Ser: 0.89 mg/dL (ref 0.50–1.10)
Glucose, Bld: 160 mg/dL — ABNORMAL HIGH (ref 70–99)
Potassium: 3.6 mEq/L (ref 3.5–5.1)

## 2012-06-03 LAB — GLUCOSE, CAPILLARY
Glucose-Capillary: 169 mg/dL — ABNORMAL HIGH (ref 70–99)
Glucose-Capillary: 141 mg/dL — ABNORMAL HIGH (ref 70–99)
Glucose-Capillary: 64 mg/dL — ABNORMAL LOW (ref 70–99)
Glucose-Capillary: 101 mg/dL — ABNORMAL HIGH (ref 70–99)
Glucose-Capillary: 172 mg/dL — ABNORMAL HIGH (ref 70–99)

## 2012-06-03 MED ORDER — CIPROFLOXACIN HCL 250 MG PO TABS
250.0000 mg | ORAL_TABLET | Freq: Two times a day (BID) | ORAL | Status: AC
  Administered 2012-06-03 – 2012-06-04 (×2): 250 mg via ORAL
  Filled 2012-06-03 (×2): qty 1

## 2012-06-03 MED ORDER — WARFARIN SODIUM 10 MG PO TABS
10.0000 mg | ORAL_TABLET | Freq: Once | ORAL | Status: AC
  Administered 2012-06-03: 10 mg via ORAL
  Filled 2012-06-03: qty 1

## 2012-06-03 NOTE — Progress Notes (Signed)
Patient Name: ELLYN [name]      SUBJECTIVE: Admitted 12/3 for increasing problems with shortness of breath occurring in the setting of diastolic heart failure known coronary artery disease and tachybradycardia syndrome and found to be in atrial fibrillation which is apparently not permanent.. She was quite hypertensive.  She has been treated with diuretics. Blood pressure has been improved. Troponins were negative. Her abnormal UA she was treated with antibiotics.  Her Lasix was changed from IV--by mouth but then back to--IV yesterday because of oxygen sats below 90.  There was some confusion about her weights which is probably down 6 pounds since admission unfortunately, this is not consistent with her eyes nose which has her down 7 L which is about 15 pounds.  Past Medical History  Diagnosis Date  . Hypertension     with h/o hypertensive urgency  . Glaucoma(365)   . Tremor, essential   . Diabetes mellitus with neuropathy   . B12 deficiency   . Hyperlipidemia   . Atrial fibrillation     a. on chronic anticoagulation  b. Tiksosyn discontinued and amiodarone  was discontinued secondary to perceived symptoms  . Chronic diastolic heart failure     echo 05/2009: mild LVH, EF 55-60%, grade 2 diast dysfxn, mild LAE, PASP  . CAD (coronary artery disease)     a.  LHC 12/27/11: mLAD 30%, prox-mid D1 90% (small);  CFX patent stent, OM3 small 95%,, mRCA 30%, EF 65%  -  med Rx; b. 07/2011 NSTEMI - PCI/DES LCX - Resolute stent;  . Carotid stenosis     dopplers 6/12: 1-39% bilat ICA  . Pulmonary fibrosis     Dr. Vassie Loll;  patient taken off O2 in 8/12; dyspnea felt CHF>>ILD  . Skin cancer   . Warfarin anticoagulation   . Hypomagnesemia   . Colon polyps   . Syncope     unknown etiology  . Bradycardia   . Myocardial infarction     " very mild :"  . Pacemaker 02/29/2012  . Shortness of breath   . Diabetes mellitus     insulin controled  . CHF (congestive heart failure)     PHYSICAL  EXAM Filed Vitals:   06/02/12 1836 06/02/12 2110 06/03/12 0409 06/03/12 1044  BP: 152/65 133/66 130/71 136/62  Pulse: 63 65 64 65  Temp:  98.5 F (36.9 C) 97.5 F (36.4 C)   TempSrc:      Resp:  18 16   Height:      Weight:   172 lb 2.9 oz (78.1 kg)   SpO2:  98% 96%     General appearance: alert, cooperative and no distress Neck: JVD - 8 cm above sternal notch Lungs: rales bilaterally Heart: regular rate and rhythm and S4 present Abdomen: soft, non-tender; bowel sounds normal; no masses,  no organomegaly Extremities: edema tr Skin: Skin color, texture, turgor normal. No rashes or lesions Neurologic: Alert and oriented X 3, normal strength and tone. Normal symmetric reflexes. Normal coordination and gait  TELEMETRY: Reviewed telemetry pt in atrial pacing with intrinsic conduction   Intake/Output Summary (Last 24 hours) at 06/03/12 1130 Last data filed at 06/03/12 0900  Gross per 24 hour  Intake   1096 ml  Output   3100 ml  Net  -2004 ml    LABS: Basic Metabolic Panel:  Lab 06/03/12 8295 06/02/12 0552 06/01/12 0510 05/31/12 0520 05/30/12 0256 05/30/12 0036  NA 137 136 137 134* 137 135  K 3.6 4.0 3.7 4.0  4.3 4.2  CL 93* 95* 95* 95* 99 98  CO2 34* 32 33* 33* 27 27  GLUCOSE 160* 172* 174* 131* 266* 274*  BUN 27* 25* 21 19 17 16   CREATININE 0.89 0.87 0.79 0.76 0.65 0.58  CALCIUM 10.4 10.5 -- -- -- --  MG -- -- -- -- 1.6 --  PHOS -- -- -- -- -- --   Cardiac Enzymes: No results found for this basename: CKTOTAL:3,CKMB:3,CKMBINDEX:3,TROPONINI:3 in the last 72 hours CBC:  Lab 05/31/12 0908 05/30/12 0256 05/30/12 0036  WBC 7.4 14.7* 12.8*  NEUTROABS -- 13.3* --  HGB 9.3* 9.8* 10.7*  HCT 30.0* 32.4* 35.0*  MCV 83.3 82.0 82.5  PLT 214 254 282   PROTIME:  Basename 06/03/12 0539 06/02/12 0552 06/01/12 0510  LABPROT 15.0 17.2* 22.2*  INR 1.20 1.44 2.04*   Liver Function Tests: No results found for this basename: AST:2,ALT:2,ALKPHOS:2,BILITOT:2,PROT:2,ALBUMIN:2 in  the last 72 hours No results found for this basename: LIPASE:2,AMYLASE:2 in the last 72 hours BNP: BNP (last 3 results)  Basename 05/30/12 0256 08/31/11 2006 08/13/11 1215  PROBNP 1210.0* 1848.0* 438.3     ASSESSMENT AND PLAN:  Patient Active Hospital Problem List: HYPERTENSION (07/28/2007)   Atrial fibrillation (09/05/2009) was evident on admission. I went over this contributed to the was precipitation of this it was an episode of congestive heart failure. We will interrogate the device. Perhaps this raises the possibility of the use of antiarrhythmic therapy to try to minimize atrial fibrillation    Acute on chronic diastolic heart failure (05/20/2011)   UTI (lower urinary tract infection) (08/17/2011)   The patient is doing better. Weights and I & O sunfortunately are quite discrepant. We'll continue to diurese her with IV Lasix overnight change her to by mouth and hopefully home on Monday. Her blood pressures are reasonably controlled.   Signed, Sherryl Manges MD  06/03/2012

## 2012-06-03 NOTE — Progress Notes (Addendum)
ANTICOAGULATION CONSULT NOTE  Pharmacy Consult for Coumadin Indication: h/o Afib  Allergies  Allergen Reactions  . Ace Inhibitors Cough    Enalapril  benicor    . Crestor (Rosuvastatin Calcium)     Fatigue and leg pain   . Lipitor (Atorvastatin Calcium)     Myalgias   . Niaspan (Niacin)     Intol   . Olmesartan Cough  . Prednisone     REACTION: "retained fluid"   Patient Measurements: Height: 5\' 2"  (157.5 cm) Weight: 172 lb 2.9 oz (78.1 kg) IBW/kg (Calculated) : 50.1   Vital Signs: Temp: 97.5 F (36.4 C) (12/07 0409) BP: 130/71 mmHg (12/07 0409) Pulse Rate: 64  (12/07 0409)  Labs:  Alexandria [name] 06/03/12 0539 06/02/12 0552 06/01/12 0510  HGB -- -- --  HCT -- -- --  PLT -- -- --  APTT -- -- --  LABPROT 15.0 17.2* 22.2*  INR 1.20 1.44 2.04*  HEPARINUNFRC -- -- --  CREATININE 0.89 0.87 0.79  CKTOTAL -- -- --  CKMB -- -- --  TROPONINI -- -- --   Estimated Creatinine Clearance: 48.8 ml/min (by C-G formula based on Cr of 0.89).  Medical History: Past Medical History  Diagnosis Date  . Hypertension     with h/o hypertensive urgency  . Glaucoma(365)   . Tremor, essential   . Diabetes mellitus with neuropathy   . B12 deficiency   . Hyperlipidemia   . Atrial fibrillation     a. on chronic anticoagulation  b. Tiksosyn discontinued and amiodarone  was discontinued secondary to perceived symptoms  . Chronic diastolic heart failure     echo 05/2009: mild LVH, EF 55-60%, grade 2 diast dysfxn, mild LAE, PASP  . CAD (coronary artery disease)     a.  LHC 12/27/11: mLAD 30%, prox-mid D1 90% (small);  CFX patent stent, OM3 small 95%,, mRCA 30%, EF 65%  -  med Rx; b. 07/2011 NSTEMI - PCI/DES LCX - Resolute stent;  . Carotid stenosis     dopplers 6/12: 1-39% bilat ICA  . Pulmonary fibrosis     Dr. Vassie Loll;  patient taken off O2 in 8/12; dyspnea felt CHF>>ILD  . Skin cancer   . Warfarin anticoagulation   . Hypomagnesemia   . Colon polyps   . Syncope     unknown etiology  .  Bradycardia   . Myocardial infarction     " very mild :"  . Pacemaker 02/29/2012  . Shortness of breath   . Diabetes mellitus     insulin controled  . CHF (congestive heart failure)    HOME Coumadin regimen:  Coumadin 5 mg daily except 10 mg on Monday and Friday.  Assessment: 78 yo female admitted with CHF,  h/o Afib on Coumadin (5 mg daily except 10 mg on Monday and Friday PTA). INR has continued to drop and is subtherapeutic at 1.2 today. This is due to dose held for supratherapeutic INR, and conservative dose given on follow-up. No bleeding complications noted. She may be a slow responder as well to changes in dose.  H/H is stable and platelets were 214 on 12/4.   Noted patient is on concurrent Cipro for abnormal UA. She remains afebrile and WBC is nml. No urine cx done.   Drug/Drug Interactions:  Cipro and Warfarin Effects: The hypoprothrombinemic effect of warfarin may be increased by ciprofloxacin. Mechanism: Unknown. Management: Dosage reduction of warfarin may be needed during concurrent administration of ciprofloxacin.   Goal of Therapy:  INR 2-3  Monitor platelets by anticoagulation protocol: Yes   Plan:  - Give Coumadin 10mg  PO x 1 today - Will f/up daily INR - Consider d/c Cipro if patient is asymptomatic.  Nadara Mustard, PharmD., MS Clinical Pharmacist Pager:  [phone] Thank you for allowing pharmacy to be part of this patients care team. 06/03/2012 9:17 AM

## 2012-06-04 LAB — GLUCOSE, CAPILLARY
Glucose-Capillary: 143 mg/dL — ABNORMAL HIGH (ref 70–99)
Glucose-Capillary: 60 mg/dL — ABNORMAL LOW (ref 70–99)
Glucose-Capillary: 97 mg/dL (ref 70–99)
Glucose-Capillary: 183 mg/dL — ABNORMAL HIGH (ref 70–99)

## 2012-06-04 LAB — PROTIME-INR
INR: 1.38 (ref 0.00–1.49)
Prothrombin Time: 16.6 seconds — ABNORMAL HIGH (ref 11.6–15.2)

## 2012-06-04 MED ORDER — WARFARIN SODIUM 7.5 MG PO TABS
7.5000 mg | ORAL_TABLET | Freq: Once | ORAL | Status: AC
  Administered 2012-06-04: 7.5 mg via ORAL
  Filled 2012-06-04: qty 1

## 2012-06-04 MED ORDER — INSULIN ASPART 100 UNIT/ML ~~LOC~~ SOLN
6.0000 [IU] | Freq: Three times a day (TID) | SUBCUTANEOUS | Status: DC
  Administered 2012-06-04 – 2012-06-05 (×3): 6 [IU] via SUBCUTANEOUS

## 2012-06-04 MED ORDER — FUROSEMIDE 80 MG PO TABS
80.0000 mg | ORAL_TABLET | Freq: Two times a day (BID) | ORAL | Status: DC
  Administered 2012-06-04 – 2012-06-05 (×2): 80 mg via ORAL
  Filled 2012-06-04 (×4): qty 1

## 2012-06-04 NOTE — Progress Notes (Signed)
Patient Name: Alexandria [name]      SUBJECTIVE: Admitted 12/3 for increasing problems with shortness of breath occurring in the setting of diastolic heart failure known coronary artery disease and tachybradycardia syndrome and found to be in atrial fibrillation which is apparently not permanent.. She was quite hypertensive.  She has been treated with diuretics. Blood pressure has been improved. Troponins were negative. Her abnormal UA she was treated with antibiotics.  Her Lasix was changed from IV--by mouth but then back to--IV Friday  because of oxygen sats below 90.  seh is better today with breathing at baseline  There was some confusion about her weights which is probably down 6 pounds since admission unfortunately, this is not consistent with her eyes nose which has her down 7 L which is about 15 pounds.  She like to drink tea and water  Past Medical History  Diagnosis Date  . Hypertension     with h/o hypertensive urgency  . Glaucoma(365)   . Tremor, essential   . Diabetes mellitus with neuropathy   . B12 deficiency   . Hyperlipidemia   . Atrial fibrillation     a. on chronic anticoagulation  b. Tiksosyn discontinued and amiodarone  was discontinued secondary to perceived symptoms  . Chronic diastolic heart failure     echo 05/2009: mild LVH, EF 55-60%, grade 2 diast dysfxn, mild LAE, PASP  . CAD (coronary artery disease)     a.  LHC 12/27/11: mLAD 30%, prox-mid D1 90% (small);  CFX patent stent, OM3 small 95%,, mRCA 30%, EF 65%  -  med Rx; b. 07/2011 NSTEMI - PCI/DES LCX - Resolute stent;  . Carotid stenosis     dopplers 6/12: 1-39% bilat ICA  . Pulmonary fibrosis     Dr. Vassie Loll;  patient taken off O2 in 8/12; dyspnea felt CHF>>ILD  . Skin cancer   . Warfarin anticoagulation   . Hypomagnesemia   . Colon polyps   . Syncope     unknown etiology  . Bradycardia   . Myocardial infarction     " very mild :"  . Pacemaker 02/29/2012  . Shortness of breath   . Diabetes mellitus       insulin controled  . CHF (congestive heart failure)     PHYSICAL EXAM Filed Vitals:   06/03/12 1341 06/03/12 2132 06/04/12 0500 06/04/12 1014  BP: 132/65 161/63 152/66 124/66  Pulse: 64 70 64 62  Temp: 98.9 F (37.2 C) 98.7 F (37.1 C) 98.1 F (36.7 C)   TempSrc: Oral Oral Oral   Resp: 18 18 18    Height:      Weight:   174 lb (78.926 kg)   SpO2: 99% 98% 100%     Weights and I&O remain discordant  WT+ 2 lbs and I&O - 2L???????  General appearance: alert, cooperative and no distress Neck: JVD - 8 cm above sternal notch Lungs: rales bilaterally Heart: regular rate and rhythm and S4 present Abdomen: soft, non-tender; bowel sounds normal; no masses,  no organomegaly Extremities: edema tr Skin: Skin color, texture, turgor normal. No rashes or lesions Neurologic: Alert and oriented X 3, normal strength and tone. Normal symmetric reflexes. Normal coordination and gait  TELEMETRY: Reviewed telemetry pt in atrial pacing with intrinsic conduction   Intake/Output Summary (Last 24 hours) at 06/04/12 1112 Last data filed at 06/04/12 0947  Gross per 24 hour  Intake    723 ml  Output   2825 ml  Net  -2102 ml  LABS: Basic Metabolic Panel:  Lab 06/03/12 1610 06/02/12 0552 06/01/12 0510 05/31/12 0520 05/30/12 0256 05/30/12 0036  NA 137 136 137 134* 137 135  K 3.6 4.0 3.7 4.0 4.3 4.2  CL 93* 95* 95* 95* 99 98  CO2 34* 32 33* 33* 27 27  GLUCOSE 160* 172* 174* 131* 266* 274*  BUN 27* 25* 21 19 17 16   CREATININE 0.89 0.87 0.79 0.76 0.65 0.58  CALCIUM 10.4 10.5 -- -- -- --  MG -- -- -- -- 1.6 --  PHOS -- -- -- -- -- --   Cardiac Enzymes: No results found for this basename: CKTOTAL:3,CKMB:3,CKMBINDEX:3,TROPONINI:3 in the last 72 hours CBC:  Lab 05/31/12 0908 05/30/12 0256 05/30/12 0036  WBC 7.4 14.7* 12.8*  NEUTROABS -- 13.3* --  HGB 9.3* 9.8* 10.7*  HCT 30.0* 32.4* 35.0*  MCV 83.3 82.0 82.5  PLT 214 254 282   PROTIME:  Basename 06/04/12 0630 06/03/12 0539 06/02/12  0552  LABPROT 16.6* 15.0 17.2*  INR 1.38 1.20 1.44   Liver Function Tests: No results found for this basename: AST:2,ALT:2,ALKPHOS:2,BILITOT:2,PROT:2,ALBUMIN:2 in the last 72 hours No results found for this basename: LIPASE:2,AMYLASE:2 in the last 72 hours BNP: BNP (last 3 results)  Basename 05/30/12 0256 08/31/11 2006 08/13/11 1215  PROBNP 1210.0* 1848.0* 438.3     ASSESSMENT AND PLAN:  Patient Active Hospital Problem List: HYPERTENSION (07/28/2007)   Atrial fibrillation (09/05/2009) was evident on admission. I went over this contributed to the was precipitation of this it was an episode of congestive heart failure. device interrogation demonstrated no recent atrial fibrillation as an explanation    Acute on chronic diastolic heart failure (05/20/2011)   UTI (lower urinary tract infection) (08/17/2011)   The patient is doing better. Weights and I & O sunfortunately are quite discrepant. We'll tranistion her lasix IV>>PO today and initiate fluid restriction Hopefully home in am  Signed, Sherryl Manges MD  06/04/2012

## 2012-06-04 NOTE — Progress Notes (Signed)
ANTICOAGULATION CONSULT NOTE  Pharmacy Consult for Coumadin Indication: h/o Afib  Allergies  Allergen Reactions  . Ace Inhibitors Cough    Enalapril  benicor    . Crestor (Rosuvastatin Calcium)     Fatigue and leg pain   . Lipitor (Atorvastatin Calcium)     Myalgias   . Niaspan (Niacin)     Intol   . Olmesartan Cough  . Prednisone     REACTION: "retained fluid"   Patient Measurements: Height: 5\' 2"  (157.5 cm) Weight: 174 lb (78.926 kg) IBW/kg (Calculated) : 50.1   Vital Signs: Temp: 98.1 F (36.7 C) (12/08 0500) Temp src: Oral (12/08 0500) BP: 152/66 mmHg (12/08 0500) Pulse Rate: 64  (12/08 0500)  Labs:  Basename 06/04/12 0630 06/03/12 0539 06/02/12 0552  HGB -- -- --  HCT -- -- --  PLT -- -- --  APTT -- -- --  LABPROT 16.6* 15.0 17.2*  INR 1.38 1.20 1.44  HEPARINUNFRC -- -- --  CREATININE -- 0.89 0.87  CKTOTAL -- -- --  CKMB -- -- --  TROPONINI -- -- --   Estimated Creatinine Clearance: 49 ml/min (by C-G formula based on Cr of 0.89).  Medical History: Past Medical History  Diagnosis Date  . Hypertension     with h/o hypertensive urgency  . Glaucoma(365)   . Tremor, essential   . Diabetes mellitus with neuropathy   . B12 deficiency   . Hyperlipidemia   . Atrial fibrillation     a. on chronic anticoagulation  b. Tiksosyn discontinued and amiodarone  was discontinued secondary to perceived symptoms  . Chronic diastolic heart failure     echo 05/2009: mild LVH, EF 55-60%, grade 2 diast dysfxn, mild LAE, PASP  . CAD (coronary artery disease)     a.  LHC 12/27/11: mLAD 30%, prox-mid D1 90% (small);  CFX patent stent, OM3 small 95%,, mRCA 30%, EF 65%  -  med Rx; b. 07/2011 NSTEMI - PCI/DES LCX - Resolute stent;  . Carotid stenosis     dopplers 6/12: 1-39% bilat ICA  . Pulmonary fibrosis     Dr. Vassie Loll;  patient taken off O2 in 8/12; dyspnea felt CHF>>ILD  . Skin cancer   . Warfarin anticoagulation   . Hypomagnesemia   . Colon polyps   . Syncope    unknown etiology  . Bradycardia   . Myocardial infarction     " very mild :"  . Pacemaker 02/29/2012  . Shortness of breath   . Diabetes mellitus     insulin controled  . CHF (congestive heart failure)    HOME Coumadin regimen:  Coumadin 5 mg daily except 10 mg on Monday and Friday.  Assessment: 78 yo female admitted with CHF,  h/o Afib on Coumadin. INR has continued to drop and is subtherapeutic at 1.38 but has now started to trend back up.  No bleeding complications noted.  H/H is stable and platelets were 214 on 12/4.  Noted patient is on concurrent Cipro for abnormal UA. She remains afebrile and WBC is nml. No urine cx done.   HOME dose:  Warfarin 5mg  daily except 10mg  on Mon/Friday.  Drug/Drug Interactions:  Cipro and Warfarin Effects: The hypoprothrombinemic effect of warfarin may be increased by ciprofloxacin. Mechanism: Unknown. Management: Dosage reduction of warfarin may be needed during concurrent administration of ciprofloxacin.   Goal of Therapy:  INR 2-3 Monitor platelets by anticoagulation protocol: Yes   Plan:  - Give Coumadin 7.5 mg PO x 1 today -  Will f/up daily INR - Consider d/c Cipro if patient is asymptomatic.  Nadara Mustard, PharmD., MS Clinical Pharmacist Pager:  [phone] Thank you for allowing pharmacy to be part of this patients care team. 06/04/2012 8:17 AM

## 2012-06-05 ENCOUNTER — Telehealth: Payer: Self-pay | Admitting: Pharmacist

## 2012-06-05 ENCOUNTER — Encounter (HOSPITAL_COMMUNITY): Payer: Self-pay | Admitting: Cardiology

## 2012-06-05 DIAGNOSIS — I5033 Acute on chronic diastolic (congestive) heart failure: Secondary | ICD-10-CM

## 2012-06-05 LAB — PROTIME-INR: INR: 1.47 (ref 0.00–1.49)

## 2012-06-05 LAB — GLUCOSE, CAPILLARY
Glucose-Capillary: 239 mg/dL — ABNORMAL HIGH (ref 70–99)
Glucose-Capillary: 325 mg/dL — ABNORMAL HIGH (ref 70–99)

## 2012-06-05 LAB — CBC
MCV: 81.7 fL (ref 78.0–100.0)
Platelets: 238 10*3/uL (ref 150–400)
RBC: 4.04 MIL/uL (ref 3.87–5.11)
RDW: 15.7 % — ABNORMAL HIGH (ref 11.5–15.5)
WBC: 6.4 10*3/uL (ref 4.0–10.5)

## 2012-06-05 MED ORDER — CIPROFLOXACIN HCL 250 MG PO TABS
250.0000 mg | ORAL_TABLET | Freq: Two times a day (BID) | ORAL | Status: DC
  Administered 2012-06-05: 250 mg via ORAL
  Filled 2012-06-05 (×2): qty 1

## 2012-06-05 MED ORDER — FUROSEMIDE 80 MG PO TABS: 80.0000 mg | ORAL_TABLET | Freq: Two times a day (BID) | ORAL | Status: DC

## 2012-06-05 MED ORDER — AMLODIPINE BESYLATE 5 MG PO TABS: 7.5000 mg | ORAL_TABLET | Freq: Every day | ORAL | Status: DC

## 2012-06-05 MED ORDER — AMLODIPINE BESYLATE 5 MG PO TABS
7.5000 mg | ORAL_TABLET | Freq: Every day | ORAL | Status: DC
  Administered 2012-06-05: 7.5 mg via ORAL
  Filled 2012-06-05: qty 1

## 2012-06-05 NOTE — Progress Notes (Signed)
SATURATION QUALIFICATIONS: (This note is used to comply with regulatory documentation for home oxygen)  Patient Saturations on Room Air at Rest = 98%  Patient Saturations on Room Air while Ambulating = 83%  Patient Saturations on 2 Liters of oxygen while Ambulating = 92%  Please briefly explain why patient needs home oxygen:

## 2012-06-05 NOTE — Discharge Summary (Addendum)
Discharge Summary   Patient ID: Alexandria [name] MRN: [id number], DOB/AGE: 76-Feb-1931 76 y.o.  Primary MD: [name], MD Primary Cardiologist: Dr. Antoine Poche Admit date: 05/30/2012 D/C date:     06/05/2012      Primary Discharge Diagnoses:  1. Acute on chronic diastolic heart failure (Acute respiratory failure)  - Dc'd on 80mg  Lasix BID  - F/u BMET 2. Hypertensive urgency  - Improved, meds as below 3. Urinary Tract Infection  - Completed 7 days Cipro  4. Normocytic Anemia  - No evidence of bleeding. Outpatient guaiac 5. Elevated TSH  - TSH 14.88. Free T3/T4 pending, will need to follow up results 6. Diabetes Mellitus, Type 2, uncontrolled  - A1C 7.6. F/u PCP  Secondary Discharge Diagnoses:  Past Medical History  Diagnosis Date  . Hypertension     with h/o hypertensive urgency  . Glaucoma(365)   . Tremor, essential   . Diabetes mellitus with neuropathy   . B12 deficiency   . Hyperlipidemia   . Atrial fibrillation     a. on chronic anticoagulation  b. Tiksosyn discontinued and amiodarone  was discontinued secondary to perceived symptoms  . Chronic diastolic heart failure     echo 05/2009: mild LVH, EF 55-60%, grade 2 diast dysfxn, mild LAE, PASP  . CAD (coronary artery disease)     a.  LHC 12/27/11: mLAD 30%, prox-mid D1 90% (small);  CFX patent stent, OM3 small 95%,, mRCA 30%, EF 65%  -  med Rx; b. 07/2011 NSTEMI - PCI/DES LCX - Resolute stent;  . Carotid stenosis     dopplers 6/12: 1-39% bilat ICA  . Pulmonary fibrosis     Dr. Vassie Loll;  patient taken off O2 in 8/12; dyspnea felt CHF>>ILD  . Skin cancer   . Warfarin anticoagulation   . Hypomagnesemia   . Colon polyps   . Syncope     unknown etiology  . Bradycardia   . Myocardial infarction     " very mild :"  . Pacemaker 02/29/2012   Allergies Allergies  Allergen Reactions  . Ace Inhibitors Cough    Enalapril  benicor    . Crestor (Rosuvastatin Calcium)     Fatigue and leg pain   . Lipitor (Atorvastatin  Calcium)     Myalgias   . Niaspan (Niacin)     Intol   . Olmesartan Cough  . Prednisone     REACTION: "retained fluid"    Diagnostic Studies/Procedures:   05/30/2012 -  PORTABLE CHEST - 1 VIEW   Findings: Worsening of diffuse symmetric airspace disease is seen, consistent with increased pulmonary edema.  Cardiomegaly remains stable.  The dual lead transvenous pacemaker remains in appropriate position.  IMPRESSION:  1.  Worsening diffuse symmetric airspace disease/edema. 2.  Stable cardiomegaly.         History of Present Illness: 78 y.o. female w/ the above medical problems who presented to [facility] on 05/30/12 with complaints of dyspnea and found to have pulmonary edema and hypertensive urgency. She complained of continued orthopnea despite increased Lasix prompting her to call EMS who noted her SBP >229mHg and sats in the 70s. She was treated with NTG and bronchodilators and transported to the ED. Initially there was concern for acute STEMI, but on arrival to the ED EKG was reviewed and code STEMI cancelled.   Hospital Course: In the ED, EKG revealed NSR 88bpm, poor anterior R wave progression, old anterior MI. No acute ST/T wave changes. CXR showed diffuse pulmonary edema.  Labs were significant for pBNP 1210, normal troponin, INR 2.11, WBC 12.8. She was volume overloaded on exam and therefore admitted for further IV diuresis. Blood pressure improved. Cardiac enzymes were cycled and remained negative. She diuresed well with improvement in volume status and respiratory status. Lasix was changed to oral dosing. Despite good diuresis sats were below 90% on room air. She will need supplemental O2 at discharge. It was noted she was not restricting her salt intake or weighing herself at home so she received more education regarding the importance of doing this. Labs demonstrated anemia, but there were no signs of bleeding. This can be followed as an outpatient. TSH was elevated at 14.883. She  will have Free T3/T4, BMET and INR drawn as an outpatient and have the results faxed to the [facility] office for review. Urinalysis was (+) for bacteria for which she completed 7 days of cipro. She was seen and evaluated by Dr. Antoine Poche who felt she was stable for discharge home with plans for follow up as scheduled below.  Discharge Vitals: Blood pressure 150/62, pulse 62, temperature 98 F (36.7 C), temperature source Oral, resp. rate 18, height 5\' 2"  (1.575 m), weight 177 lb 3.2 oz (80.377 kg), SpO2 97.00%.  Labs:  Component Value Date   WBC 7.4 05/31/2012   HGB 9.3* 05/31/2012   HCT 30.0* 05/31/2012   MCV 83.3 05/31/2012   PLT 214 05/31/2012    Lab 06/03/12 0539 05/30/12 0256  NA 137 --  K 3.6 --  CL 93* --  CO2 34* --  BUN 27* --  CREATININE 0.89 --  CALCIUM 10.4 --  PROT -- 7.3  BILITOT -- 0.4  ALKPHOS -- 88  ALT -- 11  AST -- 13  GLUCOSE 160* --     05/30/2012 01:34 05/30/2012 02:56 05/30/2012 08:05 05/30/2012 14:50  CK, MB 3.2  3.0   CK Total 36  27   Troponin I  <0.30 <0.30 <0.30     05/30/2012 02:56  Pro B Natriuretic peptide (BNP) 1210.0 (H)     05/30/2012 02:56  TSH 14.883 (H)     05/31/2012 09:08  Hemoglobin A1C 7.6 (H)     06/05/2012 07:20  Prothrombin Time 17.4 (H)  INR 1.47    Discharge Medications     Medication List     As of 06/05/2012 10:49 AM    STOP taking these medications         PACERONE 200 MG tablet   Generic drug: amiodarone      TAKE these medications         amLODipine 5 MG tablet   Commonly known as: NORVASC   Take 1.5 tablets (7.5 mg total) by mouth daily.      clopidogrel 75 MG tablet   Commonly known as: PLAVIX   Take 75 mg by mouth daily.      colesevelam 625 MG tablet   Commonly known as: WELCHOL   Take 625 mg by mouth at bedtime.      fish oil-omega-3 fatty acids 1000 MG capsule   Take 1 g by mouth at bedtime.      furosemide 80 MG tablet   Commonly known as: LASIX   Take 1 tablet (80 mg total) by mouth 2 (two)  times daily.      insulin detemir 100 UNIT/ML injection   Commonly known as: LEVEMIR   Inject 20 Units into the skin every morning.      isosorbide mononitrate 60 MG 24 hr  tablet   Commonly known as: IMDUR   Take 60 mg by mouth every morning.      latanoprost 0.005 % ophthalmic solution   Commonly known as: XALATAN   Place 1 drop into both eyes at bedtime.      magnesium oxide 400 MG tablet   Commonly known as: MAG-OX   Take 400 mg by mouth 3 (three) times daily.      metFORMIN 1000 MG tablet   Commonly known as: GLUCOPHAGE   Take 1,000 mg by mouth 2 (two) times daily with a meal.      metoprolol 50 MG tablet   Commonly known as: LOPRESSOR   Take 50 mg by mouth 2 (two) times daily.      nitroGLYCERIN 0.4 MG SL tablet   Commonly known as: NITROSTAT   Place 0.4 mg under the tongue every 5 (five) minutes as needed. As needed for chest pain      NOVOLOG FLEXPEN 100 UNIT/ML injection   Generic drug: insulin aspart   Inject 10 Units into the skin 3 (three) times daily before meals.      potassium chloride SA 20 MEQ tablet   Commonly known as: K-DUR,KLOR-CON   Take 20 mEq by mouth every morning.      VITAMIN B-12 IJ   Inject 1 application as directed every 30 (thirty) days. Given at the doctor's office at the end of each month      Vitamin D 1000 UNITS capsule   Take 1,000 Units by mouth at bedtime. OTC      warfarin 5 MG tablet   Commonly known as: COUMADIN   Take 5-10 mg by mouth daily. Takes 5mg  (1 tablet) daily except 10mg  (2 tablets) on Monday and Friday.          Disposition   Discharge Orders    Future Appointments: Provider: Department: Dept Phone: Center:   06/08/2012 10:10 AM Beatrice Lecher, PA [facility] [place]) [phone] [facility]   06/27/2012 10:00 AM Sherrie George, MD [facility] [phone] None   07/05/2012 11:30 AM [name], MD [facility] [place] [phone] [facility]    01/17/2013 1:00 PM [facility] [phone] [facility]   01/17/2013 2:00 PM Evern Bio, NP [facility] [phone] [facility]     Future Orders Please Complete By Expires   Diet - low sodium heart healthy      Increase activity slowly      Discharge instructions      Comments:   * Please have lab work (INR, BMET, T3/T4) drawn on Wednesday 12/11 and have the results sent to [facility] in [place] (fax [phone]) attention Tereso Newcomer, PA-C     Follow-up Information    Follow up with [name], MD. On 06/07/2012. (Please have lab work (INR, BMET, T3/T4) drawn and have the results sent to [facility] in [place] (fax [phone]) attention Tereso Newcomer, PA-C)    Contact information:   [address] [place] [address] [phone]       Follow up with Tereso Newcomer, PA. On 06/08/2012. (10:10)    Contact information:   [facility] [address] [place] [address] [phone]           Outstanding Labs/Studies:  1. BMET 2. INR 3. Free T3/T4  Duration of Discharge Encounter: Greater than 30 minutes including physician and PA time.  Signed, HOPE,  JESSICA PA-C 06/05/2012, 10:49 AM

## 2012-06-05 NOTE — Telephone Encounter (Signed)
Pt. states that she is doing well since hospital d/c and that she has all of her medications. She is aware of appt. scheduled with Tereso Newcomer, PA on 12/12 at 10:10 am. Pt. given office phone number to call is she has any questions or concerns.

## 2012-06-05 NOTE — Telephone Encounter (Signed)
Message copied by Velda Shell on Mon Jun 05, 2012 11:17 AM ------      Message from: Glynda Jaeger A      Created: Mon Jun 05, 2012 10:25 AM       TOC patient of Hochrein, shceduled to see Lorin Picket 06-08-12, at 1010am.            Thanks, Efraim Kaufmann

## 2012-06-05 NOTE — Progress Notes (Signed)
[facility]  Patient Status: New  [facility] is providing the following services: RN  If patient discharges after hours, please call [phone].   Alexandria [name] 06/05/2012, 2:10 PM

## 2012-06-05 NOTE — Progress Notes (Signed)
    SUBJECTIVE:  She feels better.  However, she still has some low O2 sats at night.     PHYSICAL EXAM Filed Vitals:   06/04/12 1333 06/04/12 1849 06/04/12 2100 06/05/12 0523  BP: 134/60 155/67 151/62 152/55  Pulse: 62 64 76 62  Temp: 97.3 F (36.3 C)  98.4 F (36.9 C) 98 F (36.7 C)  TempSrc: Oral  Oral Oral  Resp: 20  18 18   Height:      Weight:    177 lb 3.2 oz (80.377 kg)  SpO2: 95%  97% 97%   General:  No distress Lungs:  Diffuse crackles improved Heart:  RRR Extremities:  Trace edema  LABS: Lab Results  Component Value Date   CKTOTAL 27 05/30/2012   CKMB 3.0 05/30/2012   TROPONINI <0.30 05/30/2012   Results for orders placed during the hospital encounter of 05/30/12 (from the past 24 hour(s))  GLUCOSE, CAPILLARY     Status: Abnormal   Collection Time   06/04/12  7:34 AM      Component Value Range   Glucose-Capillary 143 (*) 70 - 99 mg/dL   Comment 1 Notify RN    GLUCOSE, CAPILLARY     Status: Abnormal   Collection Time   06/04/12 11:39 AM      Component Value Range   Glucose-Capillary 256 (*) 70 - 99 mg/dL   Comment 1 Notify RN    GLUCOSE, CAPILLARY     Status: Abnormal   Collection Time   06/04/12  4:46 PM      Component Value Range   Glucose-Capillary 60 (*) 70 - 99 mg/dL   Comment 1 Notify RN    GLUCOSE, CAPILLARY     Status: Normal   Collection Time   06/04/12  5:41 PM      Component Value Range   Glucose-Capillary 97  70 - 99 mg/dL  GLUCOSE, CAPILLARY     Status: Abnormal   Collection Time   06/04/12  9:07 PM      Component Value Range   Glucose-Capillary 183 (*) 70 - 99 mg/dL    Intake/Output Summary (Last 24 hours) at 06/05/12 1610 Last data filed at 06/05/12 9604  Gross per 24 hour  Intake   1080 ml  Output   1750 ml  Net   -670 ml    ASSESSMENT AND PLAN:  Acute on chronic diastolic heart failure:  She has not restricted salt or weighed herself.  We have talked about this.  She will go home possibly today on higher dose Lasix 80 mg bid  for now.  She will need transition of care on Thursday or Friday in [facility].  She will need a BMET on Wed.  I will check a BMET and CBC today.  She will need home O2 and a home health nurse.    Hypertensive urgency:  BP remains up slightly.  I have increased her Norvasc.   CAD:  Troponins negative.  No indication for acute coronary event.  Elevated WBC: Abnormal UA.  Day 7 of Cipro. WBC down. Discontinue after today.   Anemia:  Guaiac stool pending.  No evidence of bleeding.   Guaiac has been ordered but never sent.  We will likely follow this as an outpatient.   Diabetes: A1C was 7.6.  Plan per her primary outpatient MD.    [name] 06/05/2012 7:02 AM

## 2012-06-05 NOTE — Progress Notes (Signed)
D/c orders received; IV removed with gauze on, pt remains in stable condition; pt meds and instructions reviewed and given to pt; reminded pt to weigh herself daily and to take meds as prescribed; pt d/c to home

## 2012-06-06 ENCOUNTER — Encounter: Payer: Self-pay | Admitting: Cardiology

## 2012-06-07 ENCOUNTER — Other Ambulatory Visit: Payer: Medicare Other

## 2012-06-07 DIAGNOSIS — I5033 Acute on chronic diastolic (congestive) heart failure: Secondary | ICD-10-CM

## 2012-06-07 DIAGNOSIS — I509 Heart failure, unspecified: Secondary | ICD-10-CM

## 2012-06-07 DIAGNOSIS — N390 Urinary tract infection, site not specified: Secondary | ICD-10-CM

## 2012-06-07 DIAGNOSIS — E1149 Type 2 diabetes mellitus with other diabetic neurological complication: Secondary | ICD-10-CM

## 2012-06-08 ENCOUNTER — Ambulatory Visit (INDEPENDENT_AMBULATORY_CARE_PROVIDER_SITE_OTHER): Payer: Medicare Other | Admitting: Physician Assistant

## 2012-06-08 ENCOUNTER — Encounter: Payer: Self-pay | Admitting: Physician Assistant

## 2012-06-08 VITALS — BP 158/76 | HR 69 | Ht 63.0 in | Wt 170.0 lb

## 2012-06-08 DIAGNOSIS — I5032 Chronic diastolic (congestive) heart failure: Secondary | ICD-10-CM

## 2012-06-08 DIAGNOSIS — R7989 Other specified abnormal findings of blood chemistry: Secondary | ICD-10-CM

## 2012-06-08 DIAGNOSIS — I4891 Unspecified atrial fibrillation: Secondary | ICD-10-CM

## 2012-06-08 DIAGNOSIS — I10 Essential (primary) hypertension: Secondary | ICD-10-CM

## 2012-06-08 DIAGNOSIS — I2510 Atherosclerotic heart disease of native coronary artery without angina pectoris: Secondary | ICD-10-CM

## 2012-06-08 DIAGNOSIS — D649 Anemia, unspecified: Secondary | ICD-10-CM

## 2012-06-08 MED ORDER — FUROSEMIDE 80 MG PO TABS: ORAL_TABLET | ORAL | Status: DC

## 2012-06-08 NOTE — Progress Notes (Signed)
[address] [place], [place]  [address] Phone: [phone], Fax:  [phone]  Date:  06/08/2012   Name:  Alexandria [name]   DOB:  [birth year]   MRN:  [id number]  PCP:  [name], MD  Primary Cardiologist:  Dr. [name]  Primary Electrophysiologist:  Dr. Hillis Range    History of Present Illness: Alexandria [name] is a 78 y.o. female who returns for follow up after recent admission to the hospital for a/c diastolic CHF in the setting of HTN urgency c/b UTI.  She has a hx of CAD, s/p DES to the CFX in 2/13 in the setting of a non-STEMI, residual small vessel disease, chronic diastolic CHF, atrial fibrillation, intolerance to Tikosyn and amiodarone in the past, HTN, DM2, HL, carotid stenosis, pulmonary fibrosis.    Patient was admitted 12/3-12/9. She presented to the hospital with complaints of shortness of breath. Blood pressures noted be significantly elevated. She had pulmonary edema on her chest x-ray. Initial ECG was concerning for a STEMI. However, after further review her code STEMI was canceled. Cardiac markers remained negative. She was diuresed. She continued to have hypoxia and supplemental oxygen was recommended at discharge. She was noted to be anemic as well as to have a elevated TSH. She was treated with antibiotics for a UTI. She was to have blood work drawn yesterday at her PCPs office to followup on her thyroid and renal function. INR was also drawn.  Since discharge, she is doing well. She denies significant dyspnea. She denies chest pain. She denies syncope. She denies orthopnea, PND or pedal edema. She denies cough or fever.  Weights are down.  Labs (7/13):      LDL 102 Labs (12/13):    K 3.6 => 4.4, BUN 27 => 35, creatinine 0.9 => 1.10, ALT 11, BNP 1210, Hgb 10.3 (MCV 81.7), TSH 14.883, T4 0.98, T3 2.3, INR 1.55  Wt Readings from Last 3 Encounters:  06/08/12 170 lb (77.111 kg)  06/05/12 177 lb 3.2 oz (80.377 kg)  05/24/12 180 lb  (81.647 kg)     Past Medical History  Diagnosis Date  . Hypertension     with h/o hypertensive urgency  . Glaucoma(365)   . Tremor, essential   . Diabetes mellitus with neuropathy   . B12 deficiency   . Hyperlipidemia   . Atrial fibrillation     a. on chronic anticoagulation with warfarin  b. Tiksosyn discontinued and amiodarone  was discontinued secondary to perceived symptoms  . Chronic diastolic heart failure     a. echo 05/2009: mild LVH, EF 55-60%, grade 2 diast dysfxn, mild LAE, PASP;   b. Echo 3/13:   mod LVH, EF 55-60%, mild to mod calc AV annulus, very mild AS (mean 9 mmHg)  . CAD (coronary artery disease)     a.  b. 07/2011 NSTEMI - PCI/DES LCX - Resolute stent;;   b. LHC 12/27/11: mLAD 30%, prox-mid D1 90% (small);  CFX patent stent, OM3 small 95%,, mRCA 30%, EF 65%  -  med Rx;   . Carotid stenosis     a. dopplers 6/12: 1-39% bilat ICA; b. carotid dopplers 7/13: bilat 1-39% ICA  . Pulmonary fibrosis     Dr. Vassie Loll;  patient taken off O2 in 8/12; dyspnea felt CHF>>ILD  . Skin cancer   . Colon polyps   . Syncope     unknown etiology  . Bradycardia   . Pacemaker 02/29/2012  . Diabetes mellitus  insulin controled  . Elevated TSH 05/2012  . Normocytic anemia 05/2012    Current Outpatient Prescriptions  Medication Sig Dispense Refill  . amLODipine (NORVASC) 5 MG tablet Take 1.5 tablets (7.5 mg total) by mouth daily.  60 tablet  6  . Cholecalciferol (VITAMIN D) 1000 UNITS capsule Take 1,000 Units by mouth at bedtime. OTC      . clopidogrel (PLAVIX) 75 MG tablet Take 75 mg by mouth daily.      . colesevelam (WELCHOL) 625 MG tablet Take 625 mg by mouth at bedtime.       . Cyanocobalamin (VITAMIN B-12 IJ) Inject 1 application as directed every 30 (thirty) days. Given at the doctor's office at the end of each month      . fish oil-omega-3 fatty acids 1000 MG capsule Take 1 g by mouth at bedtime.       . furosemide (LASIX) 80 MG tablet Take 1 tablet (80 mg total) by mouth 2  (two) times daily.  60 tablet  6  . insulin aspart (NOVOLOG FLEXPEN) 100 UNIT/ML injection Inject 10 Units into the skin 3 (three) times daily before meals.      . insulin detemir (LEVEMIR) 100 UNIT/ML injection Inject 20 Units into the skin every morning.      . isosorbide mononitrate (IMDUR) 60 MG 24 hr tablet Take 60 mg by mouth every morning.       . latanoprost (XALATAN) 0.005 % ophthalmic solution Place 1 drop into both eyes at bedtime.       . magnesium oxide (MAG-OX) 400 MG tablet Take 400 mg by mouth 3 (three) times daily.      . metFORMIN (GLUCOPHAGE) 1000 MG tablet Take 1,000 mg by mouth 2 (two) times daily with a meal.      . metoprolol (LOPRESSOR) 50 MG tablet Take 50 mg by mouth 2 (two) times daily.      . nitroGLYCERIN (NITROSTAT) 0.4 MG SL tablet Place 0.4 mg under the tongue every 5 (five) minutes as needed. As needed for chest pain      . potassium chloride SA (K-DUR,KLOR-CON) 20 MEQ tablet Take 20 mEq by mouth every morning.      . warfarin (COUMADIN) 5 MG tablet Take 5-10 mg by mouth daily. Takes 5mg  (1 tablet) daily except 10mg  (2 tablets) on Monday and Friday.      . [DISCONTINUED] diltiazem (TIAZAC) 420 MG 24 hr capsule Take 420 mg by mouth daily.        Allergies: Allergies  Allergen Reactions  . Ace Inhibitors Cough    Enalapril  benicor    . Crestor (Rosuvastatin Calcium)     Fatigue and leg pain   . Lipitor (Atorvastatin Calcium)     Myalgias   . Niaspan (Niacin)     Intol   . Olmesartan Cough  . Prednisone     REACTION: "retained fluid"    Social History:  The patient  reports that she has never smoked. She has never used smokeless tobacco. She reports that she does not drink alcohol or use illicit drugs.   ROS:  Please see the history of present illness.   No vomiting, diarrhea, melena, hematochezia, hematuria.   All other systems reviewed and negative.   PHYSICAL EXAM: VS:  BP 158/76  Pulse 69  Ht 5\' 3"  (1.6 m)  Wt 170 lb (77.111 kg)  BMI 30.11  kg/m2  SpO2 99% Well nourished, well developed, in no acute distress HEENT: normal Neck: no  JVD at 90 Cardiac:  normal S1, S2; RRR; no murmur Lungs:  Right basilar crackles (chronic), otherwise no wheezing or rhonchi  Abd: soft, nontender, no hepatomegaly Ext: Trace bilateral LE edema Skin: warm and dry Neuro:  CNs 2-12 intact, no focal abnormalities noted  EKG:  A paced , HR 69, anterolateral Q waves, diffuse ST changes, no change from prior tracing    ASSESSMENT AND PLAN:  1.  Chronic Diastolic CHF:   Volume is stable. BUN on 12/10 was elevated. I suspect she is getting prerenal. I have recommended that she decrease her Lasix to 80 mg in the morning and 40 mg in the afternoon. Check a followup basic metabolic panel in one week. Keep followup with Dr. Antoine Poche in 4 weeks. She knows to continue to weigh herself. If she has sudden weight gain or increased edema or shortness of breath, she should contact us.  2. Atrial Fibrillation:   Coumadin is managed by her PCP. INR on 12/10 was 1.55. I have asked her to take an extra 5 mg of Coumadin today (total 10 mg). She will resume her usual dose. We have asked her to have a followup INR with her PCP next week to further manage her Coumadin.  3. Elevated TSH:   Free T3 and free T4 were borderline normal. Question sick euthyroid. Repeat TSH with basic metabolic panel next week. If this continues to be elevated, followup with PCP for further evaluation and management.  4. Normocytic Anemia:   Repeat CBC with blood work next week. If her hemoglobin remains low or his lower, she will need early followup with her PCP.  5. Coronary Artery Disease:   No angina. Continue Plavix and Coumadin.  6. Hypertension:   Blood pressure somewhat elevated. She tells me that she has whitecoat hypertension. I have asked for her home health nurse to check her blood pressures and to apprise [place] of her readings. If her blood pressure remains greater than 140/90, increase  amlodipine to 10 mg daily.  7. Pulmonary Fibrosis:   Followup with pulmonary as directed.  8. Disposition:   Keep followup with Dr. Antoine Poche in 4 weeks as planned. Signed, Tereso Newcomer, PA-C  10:13 AM 06/08/2012

## 2012-06-08 NOTE — Addendum Note (Signed)
Addended byAlben Spittle, Lorin Picket T on: 06/08/2012 05:41 PM   Modules accepted: Level of Service

## 2012-06-08 NOTE — Patient Instructions (Addendum)
DECREASE LASIX TO 80 MG IN THE MORNING AND 40 MG IN THE AFTERNOON  TAKE AN EXTRA 5 MG COUMADIN TODAY = 10 MG TODAY; PLEASE HAVE YOUR PRIMARY CARE PHYSICIAN CHECKS YOUR COUMADIN NEXT AND MAKES ADJUSTMENT AS NEEDED  PLEASE HAVE HOME HEALTH RN CHECK YOUR BLOOD PRESSURE NEXT WEEK AND CALL WITH RESULTS [phone]; I WILL ALSO CALL HOME HEALTH AND HAVE TH RN GET BLOOD WORK NEXT WEEK.  WEIGH DAILY AND CALL IF YOUR WEIGHT IS INCREASED BY 3 LB'S OR MORE IN 1 DAY [phone] SCOTT WEAVER, PAC  PLEASE FOLLOW UP WITH DR. HOCHREIN IN ABOUT 6 WEEKS IN THE [place] OFFICE

## 2012-06-09 ENCOUNTER — Telehealth: Payer: Self-pay | Admitting: Cardiology

## 2012-06-09 ENCOUNTER — Encounter: Payer: Self-pay | Admitting: Cardiology

## 2012-06-09 NOTE — Telephone Encounter (Signed)
Pt b/p was 142/62 today she did labs today and she will drop them off at a lab

## 2012-06-09 NOTE — Telephone Encounter (Signed)
Will forward to Tereso Newcomer, [place] for his knowledge.

## 2012-06-13 ENCOUNTER — Telehealth: Payer: Self-pay | Admitting: Physician Assistant

## 2012-06-13 MED ORDER — AMLODIPINE BESYLATE 10 MG PO TABS: 10.0000 mg | ORAL_TABLET | Freq: Every day | ORAL | Status: DC

## 2012-06-13 NOTE — Telephone Encounter (Signed)
pt has upcoming appt w/PCP 1st week of Jan 2014. I faxed over scanned lab results from 06/09/12 from [facility] with instructions from [place]. PAC

## 2012-06-13 NOTE — Telephone Encounter (Signed)
Increase Amlodipine to 10 mg QD. Tereso Newcomer, PA-C  8:26 AM 06/13/2012

## 2012-06-13 NOTE — Telephone Encounter (Signed)
pt has upcoming appt w/PCP 1st week of Jan 2014. I faxed over scanned lab results from 06/09/12 from [facility] with instructions from Scott W. PAC 

## 2012-06-13 NOTE — Telephone Encounter (Signed)
s/w [facility] about increasing norvasc to 10 mg daily. I sent in a new rx to [facility] in [place], [facility] aware and states she will let pt know as well. labs from 12/13 are scanned in now and I will have PA look at them and cb if there are any changes to be made,

## 2012-06-14 ENCOUNTER — Encounter: Payer: Medicare Other | Admitting: Internal Medicine

## 2012-06-24 ENCOUNTER — Inpatient Hospital Stay (HOSPITAL_COMMUNITY)
Admission: EM | Admit: 2012-06-24 | Discharge: 2012-06-28 | DRG: 308 | Disposition: A | Discharge status: Home Health | Payer: Medicare Other | Attending: Internal Medicine | Admitting: Internal Medicine

## 2012-06-24 ENCOUNTER — Encounter (HOSPITAL_COMMUNITY): Payer: Self-pay | Admitting: Physical Medicine and Rehabilitation

## 2012-06-24 ENCOUNTER — Emergency Department (HOSPITAL_COMMUNITY): Payer: Medicare Other

## 2012-06-24 DIAGNOSIS — I2510 Atherosclerotic heart disease of native coronary artery without angina pectoris: Secondary | ICD-10-CM

## 2012-06-24 DIAGNOSIS — Z950 Presence of cardiac pacemaker: Secondary | ICD-10-CM

## 2012-06-24 DIAGNOSIS — Z7901 Long term (current) use of anticoagulants: Secondary | ICD-10-CM

## 2012-06-24 DIAGNOSIS — N390 Urinary tract infection, site not specified: Secondary | ICD-10-CM

## 2012-06-24 DIAGNOSIS — R609 Edema, unspecified: Secondary | ICD-10-CM

## 2012-06-24 DIAGNOSIS — I5033 Acute on chronic diastolic (congestive) heart failure: Secondary | ICD-10-CM

## 2012-06-24 DIAGNOSIS — I252 Old myocardial infarction: Secondary | ICD-10-CM

## 2012-06-24 DIAGNOSIS — E669 Obesity, unspecified: Secondary | ICD-10-CM

## 2012-06-24 DIAGNOSIS — Z9861 Coronary angioplasty status: Secondary | ICD-10-CM

## 2012-06-24 DIAGNOSIS — E785 Hyperlipidemia, unspecified: Secondary | ICD-10-CM

## 2012-06-24 DIAGNOSIS — I5032 Chronic diastolic (congestive) heart failure: Secondary | ICD-10-CM

## 2012-06-24 DIAGNOSIS — I6529 Occlusion and stenosis of unspecified carotid artery: Secondary | ICD-10-CM

## 2012-06-24 DIAGNOSIS — D649 Anemia, unspecified: Secondary | ICD-10-CM | POA: Diagnosis present

## 2012-06-24 DIAGNOSIS — I491 Atrial premature depolarization: Secondary | ICD-10-CM

## 2012-06-24 DIAGNOSIS — I10 Essential (primary) hypertension: Secondary | ICD-10-CM

## 2012-06-24 DIAGNOSIS — I509 Heart failure, unspecified: Secondary | ICD-10-CM | POA: Diagnosis present

## 2012-06-24 DIAGNOSIS — E1149 Type 2 diabetes mellitus with other diabetic neurological complication: Secondary | ICD-10-CM | POA: Diagnosis present

## 2012-06-24 DIAGNOSIS — Z794 Long term (current) use of insulin: Secondary | ICD-10-CM

## 2012-06-24 DIAGNOSIS — E119 Type 2 diabetes mellitus without complications: Secondary | ICD-10-CM

## 2012-06-24 DIAGNOSIS — I5043 Acute on chronic combined systolic (congestive) and diastolic (congestive) heart failure: Secondary | ICD-10-CM

## 2012-06-24 DIAGNOSIS — Z860100 Personal history of colon polyps, unspecified: Secondary | ICD-10-CM

## 2012-06-24 DIAGNOSIS — E1142 Type 2 diabetes mellitus with diabetic polyneuropathy: Secondary | ICD-10-CM | POA: Diagnosis present

## 2012-06-24 DIAGNOSIS — E039 Hypothyroidism, unspecified: Secondary | ICD-10-CM | POA: Diagnosis present

## 2012-06-24 DIAGNOSIS — E538 Deficiency of other specified B group vitamins: Secondary | ICD-10-CM | POA: Diagnosis present

## 2012-06-24 DIAGNOSIS — R7989 Other specified abnormal findings of blood chemistry: Secondary | ICD-10-CM

## 2012-06-24 DIAGNOSIS — I495 Sick sinus syndrome: Secondary | ICD-10-CM

## 2012-06-24 DIAGNOSIS — I4891 Unspecified atrial fibrillation: Principal | ICD-10-CM

## 2012-06-24 DIAGNOSIS — D72829 Elevated white blood cell count, unspecified: Secondary | ICD-10-CM

## 2012-06-24 DIAGNOSIS — R0602 Shortness of breath: Secondary | ICD-10-CM

## 2012-06-24 DIAGNOSIS — R531 Weakness: Secondary | ICD-10-CM

## 2012-06-24 LAB — URINE MICROSCOPIC-ADD ON

## 2012-06-24 LAB — CBC WITH DIFFERENTIAL/PLATELET
Basophils Absolute: 0 10*3/uL (ref 0.0–0.1)
Eosinophils Absolute: 0 10*3/uL (ref 0.0–0.7)
Eosinophils Relative: 1 % (ref 0–5)
HCT: 32.6 % — ABNORMAL LOW (ref 36.0–46.0)
Hemoglobin: 9.4 g/dL — ABNORMAL LOW (ref 12.0–15.0)
Hemoglobin: 9.9 g/dL — ABNORMAL LOW (ref 12.0–15.0)
Lymphocytes Relative: 17 % (ref 12–46)
Lymphocytes Relative: 20 % (ref 12–46)
Lymphs Abs: 1.1 10*3/uL (ref 0.7–4.0)
MCH: 24 pg — ABNORMAL LOW (ref 26.0–34.0)
MCV: 79.6 fL (ref 78.0–100.0)
Monocytes Absolute: 0.5 10*3/uL (ref 0.1–1.0)
Monocytes Relative: 8 % (ref 3–12)
Neutro Abs: 4.2 10*3/uL (ref 1.7–7.7)
Neutrophils Relative %: 75 % (ref 43–77)
RBC: 3.92 MIL/uL (ref 3.87–5.11)
RDW: 15.8 % — ABNORMAL HIGH (ref 11.5–15.5)
WBC: 6.6 10*3/uL (ref 4.0–10.5)
WBC: 6.1 10*3/uL (ref 4.0–10.5)

## 2012-06-24 LAB — GLUCOSE, CAPILLARY
Glucose-Capillary: 275 mg/dL — ABNORMAL HIGH (ref 70–99)
Glucose-Capillary: 221 mg/dL — ABNORMAL HIGH (ref 70–99)
Glucose-Capillary: 315 mg/dL — ABNORMAL HIGH (ref 70–99)

## 2012-06-24 LAB — COMPREHENSIVE METABOLIC PANEL
ALT: 11 U/L (ref 0–35)
ALT: 11 U/L (ref 0–35)
AST: 14 U/L (ref 0–37)
Alkaline Phosphatase: 84 U/L (ref 39–117)
Alkaline Phosphatase: 86 U/L (ref 39–117)
BUN: 17 mg/dL (ref 6–23)
CO2: 27 mEq/L (ref 19–32)
CO2: 27 mEq/L (ref 19–32)
Chloride: 97 mEq/L (ref 96–112)
Creatinine, Ser: 0.74 mg/dL (ref 0.50–1.10)
GFR calc Af Amer: 90 mL/min — ABNORMAL LOW (ref >90)
GFR calc non Af Amer: 77 mL/min — ABNORMAL LOW (ref >90)
GFR calc non Af Amer: 78 mL/min — ABNORMAL LOW (ref >90)
Glucose, Bld: 275 mg/dL — ABNORMAL HIGH (ref 70–99)
Potassium: 3.5 mEq/L (ref 3.5–5.1)
Potassium: 3.5 mEq/L (ref 3.5–5.1)
Total Bilirubin: 0.3 mg/dL (ref 0.3–1.2)
Total Protein: 6.7 g/dL (ref 6.0–8.3)

## 2012-06-24 LAB — URINALYSIS, ROUTINE W REFLEX MICROSCOPIC
Bilirubin Urine: NEGATIVE
Glucose, UA: 100 mg/dL — AB
Hgb urine dipstick: NEGATIVE
Ketones, ur: 15 mg/dL — AB
Protein, ur: NEGATIVE mg/dL
pH: 7.5 (ref 5.0–8.0)

## 2012-06-24 LAB — POCT I-STAT 3, VENOUS BLOOD GAS (G3P V)
Acid-Base Excess: 5 mmol/L — ABNORMAL HIGH (ref 0.0–2.0)
Bicarbonate: 29.1 mEq/L — ABNORMAL HIGH (ref 20.0–24.0)
pH, Ven: 7.486 — ABNORMAL HIGH (ref 7.250–7.300)

## 2012-06-24 LAB — PRO B NATRIURETIC PEPTIDE: Pro B Natriuretic peptide (BNP): 1253 pg/mL — ABNORMAL HIGH (ref 0–450)

## 2012-06-24 LAB — TROPONIN I
Troponin I: <0.3 ng/mL (ref <0.30)
Troponin I: <0.3 ng/mL (ref <0.30)

## 2012-06-24 LAB — PROTIME-INR: Prothrombin Time: 20.5 seconds — ABNORMAL HIGH (ref 11.6–15.2)

## 2012-06-24 MED ORDER — FUROSEMIDE 80 MG PO TABS
80.0000 mg | ORAL_TABLET | Freq: Two times a day (BID) | ORAL | Status: DC
  Administered 2012-06-25 – 2012-06-28 (×7): 80 mg via ORAL
  Filled 2012-06-24 (×10): qty 1

## 2012-06-24 MED ORDER — SODIUM CHLORIDE 0.9 % IV BOLUS (SEPSIS)
1000.0000 mL | Freq: Once | INTRAVENOUS | Status: AC
  Administered 2012-06-24: 1000 mL via INTRAVENOUS

## 2012-06-24 MED ORDER — AMLODIPINE BESYLATE 5 MG PO TABS
7.5000 mg | ORAL_TABLET | Freq: Every day | ORAL | Status: DC
  Administered 2012-06-24 – 2012-06-25 (×2): 7.5 mg via ORAL
  Filled 2012-06-24 (×2): qty 1

## 2012-06-24 MED ORDER — METFORMIN HCL 500 MG PO TABS
1000.0000 mg | ORAL_TABLET | Freq: Two times a day (BID) | ORAL | Status: DC
  Administered 2012-06-25 – 2012-06-28 (×7): 1000 mg via ORAL
  Filled 2012-06-24 (×9): qty 2

## 2012-06-24 MED ORDER — LATANOPROST 0.005 % OP SOLN
1.0000 [drp] | Freq: Every day | OPHTHALMIC | Status: DC
  Administered 2012-06-24 – 2012-06-27 (×4): 1 [drp] via OPHTHALMIC
  Filled 2012-06-24: qty 2.5

## 2012-06-24 MED ORDER — MAGNESIUM OXIDE 400 (241.3 MG) MG PO TABS
400.0000 mg | ORAL_TABLET | Freq: Three times a day (TID) | ORAL | Status: DC
  Administered 2012-06-24 – 2012-06-28 (×12): 400 mg via ORAL
  Filled 2012-06-24 (×13): qty 1

## 2012-06-24 MED ORDER — SODIUM CHLORIDE 0.9 % IJ SOLN: 3.0000 mL | INTRAMUSCULAR | Status: DC | PRN

## 2012-06-24 MED ORDER — CLOPIDOGREL BISULFATE 75 MG PO TABS
75.0000 mg | ORAL_TABLET | Freq: Every day | ORAL | Status: DC
  Administered 2012-06-24 – 2012-06-28 (×5): 75 mg via ORAL
  Filled 2012-06-24 (×5): qty 1

## 2012-06-24 MED ORDER — POTASSIUM CHLORIDE CRYS ER 20 MEQ PO TBCR
20.0000 meq | EXTENDED_RELEASE_TABLET | Freq: Two times a day (BID) | ORAL | Status: DC
  Administered 2012-06-24 – 2012-06-28 (×8): 20 meq via ORAL
  Filled 2012-06-24 (×9): qty 1

## 2012-06-24 MED ORDER — SODIUM CHLORIDE 0.9 % IV SOLN: 250.0000 mL | INTRAVENOUS | Status: DC | PRN

## 2012-06-24 MED ORDER — ONDANSETRON HCL 4 MG/2ML IJ SOLN: 4.0000 mg | Freq: Four times a day (QID) | INTRAMUSCULAR | Status: DC | PRN

## 2012-06-24 MED ORDER — METOPROLOL TARTRATE 50 MG PO TABS
50.0000 mg | ORAL_TABLET | Freq: Two times a day (BID) | ORAL | Status: DC
  Administered 2012-06-24 – 2012-06-28 (×8): 50 mg via ORAL
  Filled 2012-06-24 (×9): qty 1

## 2012-06-24 MED ORDER — ACETAMINOPHEN 325 MG PO TABS: 650.0000 mg | ORAL_TABLET | ORAL | Status: DC | PRN

## 2012-06-24 MED ORDER — COLESEVELAM HCL 625 MG PO TABS
625.0000 mg | ORAL_TABLET | Freq: Every day | ORAL | Status: DC
  Administered 2012-06-24 – 2012-06-27 (×4): 625 mg via ORAL
  Filled 2012-06-24 (×5): qty 1

## 2012-06-24 MED ORDER — INSULIN ASPART 100 UNIT/ML ~~LOC~~ SOLN
10.0000 [IU] | Freq: Three times a day (TID) | SUBCUTANEOUS | Status: DC
  Administered 2012-06-25 – 2012-06-28 (×10): 10 [IU] via SUBCUTANEOUS

## 2012-06-24 MED ORDER — INSULIN DETEMIR 100 UNIT/ML ~~LOC~~ SOLN
26.0000 [IU] | Freq: Every morning | SUBCUTANEOUS | Status: DC
  Administered 2012-06-25 – 2012-06-28 (×4): 26 [IU] via SUBCUTANEOUS
  Filled 2012-06-24: qty 10

## 2012-06-24 MED ORDER — CEPHALEXIN 500 MG PO CAPS: 500.0000 mg | ORAL_CAPSULE | Freq: Four times a day (QID) | ORAL | Status: DC

## 2012-06-24 MED ORDER — WARFARIN - PHARMACIST DOSING INPATIENT
Freq: Every day | Status: DC
  Administered 2012-06-25 – 2012-06-27 (×2)

## 2012-06-24 MED ORDER — SODIUM CHLORIDE 0.9 % IJ SOLN
3.0000 mL | Freq: Two times a day (BID) | INTRAMUSCULAR | Status: DC
  Administered 2012-06-24 – 2012-06-28 (×8): 3 mL via INTRAVENOUS

## 2012-06-24 MED ORDER — NITROGLYCERIN 0.4 MG SL SUBL: 0.4000 mg | SUBLINGUAL_TABLET | SUBLINGUAL | Status: DC | PRN

## 2012-06-24 MED ORDER — ISOSORBIDE MONONITRATE ER 60 MG PO TB24
60.0000 mg | ORAL_TABLET | Freq: Every morning | ORAL | Status: DC
  Administered 2012-06-25 – 2012-06-28 (×4): 60 mg via ORAL
  Filled 2012-06-24 (×4): qty 1

## 2012-06-24 MED ORDER — WARFARIN SODIUM 10 MG PO TABS
10.0000 mg | ORAL_TABLET | ORAL | Status: AC
  Administered 2012-06-24: 10 mg via ORAL
  Filled 2012-06-24: qty 1

## 2012-06-24 MED ORDER — SODIUM CHLORIDE 0.9 % IV SOLN
Freq: Once | INTRAVENOUS | Status: AC
  Administered 2012-06-24: 17:00:00 via INTRAVENOUS

## 2012-06-24 NOTE — H&P (Signed)
Admit date: 06/24/2012 Referring Physician Dr. Radford Pax Primary Cardiologist Dr. Antoine Poche Chief complaint/reason for admission:  CHF/PAF with RVR  HPI: This is an 77yo WF with a history of CAD/diastolic CHF/HTN/DM/hypothyroidism/carotid stenosis/PAF/PPM with recent admission for CHF who was doing well until she woke up this am.  When she got up she said that her head was swimmy feeling and felt like she was going to pass out so she went back to bed.  She finally got up to fix something to eat and felt better but then she got real dizzy again and had to sit down.  She denied any palpitations, chest pain or syncope.  She has chronic SOB but says it was worse today.  In the ER her pro BNP was noted to be 12K but this is similar to her last admit.  Her chest xray showed improved edema from last xray on last admit in November and labs unremarkable but on pacer check she was noted to have 2 high a/v rate episodes with mode switching today for 9 hours.  Currently she is in NSR.      PMH:    Past Medical History  Diagnosis Date  . Hypertension     with h/o hypertensive urgency  . Glaucoma(365)   . Tremor, essential   . Diabetes mellitus with neuropathy   . B12 deficiency   . Hyperlipidemia   . Atrial fibrillation     a. on chronic anticoagulation with warfarin  b. Tiksosyn discontinued and amiodarone  was discontinued secondary to perceived symptoms  . Chronic diastolic heart failure     a. echo 05/2009: mild LVH, EF 55-60%, grade 2 diast dysfxn, mild LAE, PASP;   b. Echo 3/13:   mod LVH, EF 55-60%, mild to mod calc AV annulus, very mild AS (mean 9 mmHg)  . CAD (coronary artery disease)     a.  b. 07/2011 NSTEMI - PCI/DES LCX - Resolute stent;;   b. LHC 12/27/11: mLAD 30%, prox-mid D1 90% (small);  CFX patent stent, OM3 small 95%,, mRCA 30%, EF 65%  -  med Rx;   . Carotid stenosis     a. dopplers 6/12: 1-39% bilat ICA; b. carotid dopplers 7/13: bilat 1-39% ICA  . Pulmonary fibrosis     Dr. Vassie Loll;   patient taken off O2 in 8/12; dyspnea felt CHF>>ILD  . Skin cancer   . Colon polyps   . Syncope     unknown etiology  . Bradycardia   . Pacemaker 02/29/2012  . Diabetes mellitus     insulin controled  . Elevated TSH 05/2012  . Normocytic anemia 05/2012    PSH:    Past Surgical History  Procedure Date  . Appendectomy   . Nose surgery   . Colonoscopy   . Cardiac catheterization 2013    stent  . Coronary angioplasty with stent placement   . Insert / replace / remove pacemaker 02/29/2012  . Dilation and curettage of uterus     ALLERGIES:   Ace inhibitors; Crestor; Lipitor; Niaspan; Olmesartan; and Prednisone  Prior to Admit Meds:   (Not in a hospital admission) Family HX:    Family History  Problem Relation Age of Onset  . Throat cancer Brother   . Prostate cancer Father   . Diabetes Mother   . Diabetes Brother     multiple  . Heart disease Brother   . Colon cancer Neg Hx    Social HX:    History   Social History  .  Marital Status: Widowed    Spouse Name: N/A    Number of Children: 1  . Years of Education: N/A   Occupational History  . retired    Social History Main Topics  . Smoking status: Never Smoker   . Smokeless tobacco: Never Used  . Alcohol Use: No  . Drug Use: No  . Sexually Active: No   Other Topics Concern  . Not on file   Social History Narrative   Widow, lives alone at home.     ROS:  All 11 ROS were addressed and are negative except what is stated in the HPI  PHYSICAL EXAM Filed Vitals:   06/24/12 1800  BP: 145/53  Pulse: 64  Temp:   Resp: 20   General: Well developed, well nourished, in no acute distress Head: Eyes PERRLA, No xanthomas.   Normal cephalic and atramatic  Lungs:   Few crackles at bases Heart:   HRRR S1 S2 Pulses are 2+ & equal.            No carotid bruit. No JVD.  No abdominal bruits. No femoral bruits. Abdomen: Bowel sounds are positive, abdomen soft and non-tender without masses  Extremities:   No clubbing,  cyanosis or edema.  DP +1 Neuro: Alert and oriented X 3. Psych:  Good affect, responds appropriately   Labs:   Lab Results  Component Value Date   WBC 6.6 06/24/2012   HGB 9.4* 06/24/2012   HCT 31.2* 06/24/2012   MCV 79.6 06/24/2012   PLT 171 06/24/2012    Lab 06/24/12 1600  NA 134*  K 3.5  CL 96  CO2 27  BUN 17  CREATININE 0.77  CALCIUM 9.8  PROT 6.7  BILITOT 0.3  ALKPHOS 84  ALT 11  AST 15  GLUCOSE 275*   Lab Results  Component Value Date   CKTOTAL 27 05/30/2012   CKMB 3.0 05/30/2012   TROPONINI <0.30 06/24/2012   No results found for this basename: PTT   Lab Results  Component Value Date   INR 1.83* 06/24/2012   INR 1.47 06/05/2012   INR 1.38 06/04/2012     Lab Results  Component Value Date   CHOL 186 01/22/2012   CHOL 205* 08/15/2011   CHOL 185 05/21/2011   Lab Results  Component Value Date   HDL 50 01/22/2012   HDL 38* 08/15/2011   HDL 47 05/21/2011   Lab Results  Component Value Date   LDLCALC 102* 01/22/2012   LDLCALC 127* 08/15/2011   LDLCALC 111* 05/21/2011   Lab Results  Component Value Date   TRIG 169* 01/22/2012   TRIG 201* 08/15/2011   TRIG 133 05/21/2011   Lab Results  Component Value Date   CHOLHDL 3.7 01/22/2012   CHOLHDL 5.4 08/15/2011   CHOLHDL 3.9 05/21/2011   No results found for this basename: LDLDIRECT      Radiology:  *RADIOLOGY REPORT*  Clinical Data: Hyperglycemia, short of breath  CHEST - 1 VIEW  Comparison: Chest radiograph 05/30/2012  Findings: Left-sided pacemaker overlies stable cardiac silhouette.  There is improvement in the pulmonary edema pattern seen on prior.  Low lung volumes. No pneumothorax.  IMPRESSION:  Improved pulmonary edema pattern.  Original Report Authenticated By: Genevive Bi, M.D.    EKG:  Atrial paced with anterior infarct old  ASSESSMENT:  1.  Dizziness possible secondary to PAF with RVR 2.  PAF with RVR by pacer check - has not tolerated Tikosyn or amiodarone in the past 3.  Elevated pro BNP with some crackles at bases 4.  Chronic diastolic CHF EF 55-60^ by echo 5.  CAD - stable 6.  DM 7.  HTN 8.  Chronic systemic anticoagulation - INR subtherapeutic 9.   Hypothyroidism 10. Anemia  PLAN:   1.  Admit to tele bed 2.  Cycle cardiac enzymes 3.  Continue current meds 4.  EP eval in am to determine further treatment for PAF 5.  Pharmacy to manage warfarin 6.  Since pro BNP is still elevated with a few crackles on exam I will Increase Lasix to 80mg  BID PO - lung exam difficult given underlying pulmonary fibrosis as well  Quintella Reichert, MD  06/24/2012  6:48 PM

## 2012-06-24 NOTE — ED Notes (Signed)
Admitting MD at bedside.

## 2012-06-24 NOTE — Progress Notes (Signed)
ANTICOAGULATION CONSULT NOTE - Initial Consult  Pharmacy Consult for coumadin Indication: atrial fibrillation  Allergies  Allergen Reactions  . Ace Inhibitors Cough    Enalapril  benicor    . Crestor (Rosuvastatin Calcium) Other (See Comments)    Fatigue and leg pain   . Lipitor (Atorvastatin Calcium)     Myalgias   . Niaspan (Niacin) Other (See Comments)    unknown  . Olmesartan Cough  . Prednisone     REACTION: "retained fluid"    Patient Measurements:  Vital Signs: Temp: 98.7 F (37.1 C) (12/28 2026) Temp src: Oral (12/28 1413) BP: 136/52 mmHg (12/28 2004) Pulse Rate: 65  (12/28 2026)  Labs:  Basename 06/24/12 1937 06/24/12 1936 06/24/12 1600  HGB -- 9.9* 9.4*  HCT -- 32.6* 31.2*  PLT -- 166 171  APTT -- -- --  LABPROT -- -- 20.5*  INR -- -- 1.83*  HEPARINUNFRC -- -- --  CREATININE -- 0.74 0.77  CKTOTAL -- -- --  CKMB -- -- --  TROPONINI <0.30 -- <0.30    The CrCl is unknown because both a height and weight (above a minimum accepted value) are required for this calculation.   Medical History: Past Medical History  Diagnosis Date  . Hypertension     with h/o hypertensive urgency  . Glaucoma(365)   . Tremor, essential   . Diabetes mellitus with neuropathy   . B12 deficiency   . Hyperlipidemia   . Atrial fibrillation     a. on chronic anticoagulation with warfarin  b. Tiksosyn discontinued and amiodarone  was discontinued secondary to perceived symptoms  . Chronic diastolic heart failure     a. echo 05/2009: mild LVH, EF 55-60%, grade 2 diast dysfxn, mild LAE, PASP;   b. Echo 3/13:   mod LVH, EF 55-60%, mild to mod calc AV annulus, very mild AS (mean 9 mmHg)  . CAD (coronary artery disease)     a.  b. 07/2011 NSTEMI - PCI/DES LCX - Resolute stent;;   b. LHC 12/27/11: mLAD 30%, prox-mid D1 90% (small);  CFX patent stent, OM3 small 95%,, mRCA 30%, EF 65%  -  med Rx;   . Carotid stenosis     a. dopplers 6/12: 1-39% bilat ICA; b. carotid dopplers 7/13:  bilat 1-39% ICA  . Pulmonary fibrosis     Dr. Vassie Loll;  patient taken off O2 in 8/12; dyspnea felt CHF>>ILD  . Skin cancer   . Colon polyps   . Syncope     unknown etiology  . Bradycardia   . Pacemaker 02/29/2012  . Diabetes mellitus     insulin controled  . Elevated TSH 05/2012  . Normocytic anemia 05/2012    Medications:  Prescriptions prior to admission  Medication Sig Dispense Refill  . amLODipine (NORVASC) 5 MG tablet Take 7.5 mg by mouth daily.      . clopidogrel (PLAVIX) 75 MG tablet Take 75 mg by mouth daily.      . colesevelam (WELCHOL) 625 MG tablet Take 625 mg by mouth at bedtime.       . Cyanocobalamin (VITAMIN B-12 IJ) Inject 1 application as directed every 30 (thirty) days. Given at the doctor's office at the end of each month      . Fe Fum-FA-B Cmp-C-Zn-Mg-Mn-Cu (HEMOCYTE PLUS PO) Take 1 tablet by mouth daily.      . fish oil-omega-3 fatty acids 1000 MG capsule Take 1 g by mouth at bedtime.       . furosemide (  LASIX) 80 MG tablet Take 40-80 mg by mouth 2 (two) times daily. TAKE 80 MG IN THE MORNING AND 40 MG IN THE AFTERNOON      . insulin aspart (NOVOLOG FLEXPEN) 100 UNIT/ML injection Inject 10 Units into the skin 3 (three) times daily before meals.      . insulin detemir (LEVEMIR) 100 UNIT/ML injection Inject 26 Units into the skin every morning.       . isosorbide mononitrate (IMDUR) 60 MG 24 hr tablet Take 60 mg by mouth every morning.       . latanoprost (XALATAN) 0.005 % ophthalmic solution Place 1 drop into both eyes at bedtime.       . magnesium oxide (MAG-OX) 400 MG tablet Take 400 mg by mouth 3 (three) times daily.      . metFORMIN (GLUCOPHAGE) 1000 MG tablet Take 1,000 mg by mouth 2 (two) times daily with a meal.      . metoprolol (LOPRESSOR) 50 MG tablet Take 50 mg by mouth 2 (two) times daily.      . potassium chloride SA (K-DUR,KLOR-CON) 20 MEQ tablet Take 20 mEq by mouth 2 (two) times daily.       Marland Kitchen warfarin (COUMADIN) 5 MG tablet Take 5-10 mg by mouth  daily. Takes 5mg  (1 tablet) daily except  on Monday Wednesday and Friday and takes 10 mg      . nitroGLYCERIN (NITROSTAT) 0.4 MG SL tablet Place 0.4 mg under the tongue every 5 (five) minutes as needed. As needed for chest pain       Scheduled:    . [COMPLETED] sodium chloride   Intravenous Once  . amLODipine  7.5 mg Oral Daily  . clopidogrel  75 mg Oral Daily  . colesevelam  625 mg Oral QHS  . furosemide  80 mg Oral BID  . insulin aspart  10 Units Subcutaneous TID AC  . insulin detemir  26 Units Subcutaneous q morning - 10a  . isosorbide mononitrate  60 mg Oral q morning - 10a  . latanoprost  1 drop Both Eyes QHS  . magnesium oxide  400 mg Oral TID  . metFORMIN  1,000 mg Oral BID WC  . metoprolol  50 mg Oral BID  . potassium chloride SA  20 mEq Oral BID  . [COMPLETED] sodium chloride  1,000 mL Intravenous Once  . sodium chloride  3 mL Intravenous Q12H    Assessment: 78 yo with MMP who was admitted for afib. She has been on coumadin for afib. Admission INR = 1.83. Coumadin to be continued here.  Goal of Therapy:  INR 2-3 Monitor platelets by anticoagulation protocol: Yes   Plan:  Coumadin 10mg  PO x1 tonight Daily INR  Ulyses [place]ward [place] 06/24/2012,9:02 PM

## 2012-06-24 NOTE — ED Notes (Signed)
Pt presents to department via [facility] for evaluation of hyperglycemia. CBG 397. States she hasn't been feeling well for several days. Wears 2L O2 at home. Denies pain. Respirations unlabored. Does have pacemaker. 20g RAC. She is conscious alert and oriented x4.

## 2012-06-24 NOTE — ED Provider Notes (Signed)
History     CSN: [id number]  Arrival date & time 06/24/12  1403   First MD Initiated Contact with Patient 06/24/12 1430      Chief Complaint  Patient presents with  . Hyperglycemia     HPI chief complaint not feeling well. Patient arrived by private vehicle. History provided by patient and family. No language barriers identified. Information not limited. Onset chronic. Symptoms not improved or worsened by anything. Severity not. Timing constant. Duration several months. Context patient missed her morning medications. For associated signs and symptoms please refer to review of systems. No treatments prior to arrival. Regarding recent medical care patient has seen her primary care physician twice in the last week who reportedly started her on iron. Regarding social history see nurse's notes. I have reviewed patient's past medical, past surgical, social history as well as medications and allergies.  Past Medical History  Diagnosis Date  . Hypertension     with h/o hypertensive urgency  . Glaucoma(365)   . Tremor, essential   . Diabetes mellitus with neuropathy   . B12 deficiency   . Hyperlipidemia   . Atrial fibrillation     a. on chronic anticoagulation with warfarin  b. Tiksosyn discontinued and amiodarone  was discontinued secondary to perceived symptoms  . Chronic diastolic heart failure     a. echo 05/2009: mild LVH, EF 55-60%, grade 2 diast dysfxn, mild LAE, PASP;   b. Echo 3/13:   mod LVH, EF 55-60%, mild to mod calc AV annulus, very mild AS (mean 9 mmHg)  . CAD (coronary artery disease)     a.  b. 07/2011 NSTEMI - PCI/DES LCX - Resolute stent;;   b. LHC 12/27/11: mLAD 30%, prox-mid D1 90% (small);  CFX patent stent, OM3 small 95%,, mRCA 30%, EF 65%  -  med Rx;   . Carotid stenosis     a. dopplers 6/12: 1-39% bilat ICA; b. carotid dopplers 7/13: bilat 1-39% ICA  . Pulmonary fibrosis     Dr. Vassie Loll;  patient taken off O2 in 8/12; dyspnea felt CHF>>ILD  . Skin cancer   . Colon  polyps   . Syncope     unknown etiology  . Bradycardia   . Pacemaker 02/29/2012  . Diabetes mellitus     insulin controled  . Elevated TSH 05/2012  . Normocytic anemia 05/2012    Past Surgical History  Procedure Date  . Appendectomy   . Nose surgery   . Colonoscopy   . Cardiac catheterization 2013    stent  . Coronary angioplasty with stent placement   . Insert / replace / remove pacemaker 02/29/2012  . Dilation and curettage of uterus     Family History  Problem Relation Age of Onset  . Throat cancer Brother   . Prostate cancer Father   . Diabetes Mother   . Diabetes Brother     multiple  . Heart disease Brother   . Colon cancer Neg Hx     History  Substance Use Topics  . Smoking status: Never Smoker   . Smokeless tobacco: Never Used  . Alcohol Use: No    OB History    Grav Para Term Preterm Abortions TAB SAB Ect Mult Living                  Review of Systems  Constitutional: Negative for fever, chills, diaphoresis, appetite change and fatigue.       "Doesn't feel well."  HENT: Negative for hearing loss,  ear pain, nosebleeds, congestion, rhinorrhea, sneezing, trouble swallowing, neck pain, neck stiffness, voice change, sinus pressure, tinnitus and ear discharge.   Eyes: Negative for photophobia, pain, discharge, redness, itching and visual disturbance.  Respiratory: Negative for cough, chest tightness, shortness of breath, wheezing and stridor.   Cardiovascular: Negative for chest pain, palpitations and leg swelling.  Gastrointestinal: Negative for nausea, vomiting, abdominal pain, diarrhea, constipation, blood in stool, abdominal distention and anal bleeding.  Genitourinary: Negative for dysuria, urgency, frequency, hematuria, flank pain, decreased urine volume, difficulty urinating and pelvic pain.  Musculoskeletal: Negative for myalgias, back pain and joint swelling.  Skin: Negative for pallor, rash and wound.  Neurological: Negative for dizziness, tremors,  seizures, syncope, facial asymmetry, speech difficulty, weakness, light-headedness, numbness and headaches.  Hematological: Negative for adenopathy. Does not bruise/bleed easily.  Psychiatric/Behavioral: Negative for confusion and decreased concentration.    Allergies  Ace inhibitors; Crestor; Lipitor; Niaspan; Olmesartan; and Prednisone  Home Medications   Current Outpatient Rx  Name  Route  Sig  Dispense  Refill  . AMLODIPINE BESYLATE 5 MG PO TABS   Oral   Take 7.5 mg by mouth daily.         Marland Kitchen CLOPIDOGREL BISULFATE 75 MG PO TABS   Oral   Take 75 mg by mouth daily.         . COLESEVELAM HCL 625 MG PO TABS   Oral   Take 625 mg by mouth at bedtime.          Marland Kitchen VITAMIN B-12 IJ   Injection   Inject 1 application as directed every 30 (thirty) days. Given at the doctor's office at the end of each month         . HEMOCYTE PLUS PO   Oral   Take 1 tablet by mouth daily.         . OMEGA-3 FATTY ACIDS 1000 MG PO CAPS   Oral   Take 1 g by mouth at bedtime.          . FUROSEMIDE 80 MG PO TABS   Oral   Take 40-80 mg by mouth 2 (two) times daily. TAKE 80 MG IN THE MORNING AND 40 MG IN THE AFTERNOON         . INSULIN ASPART 100 UNIT/ML Colon SOLN   Subcutaneous   Inject 10 Units into the skin 3 (three) times daily before meals.         . INSULIN DETEMIR 100 UNIT/ML  SOLN   Subcutaneous   Inject 26 Units into the skin every morning.          . ISOSORBIDE MONONITRATE ER 60 MG PO TB24   Oral   Take 60 mg by mouth every morning.          Marland Kitchen LATANOPROST 0.005 % OP SOLN   Both Eyes   Place 1 drop into both eyes at bedtime.          Marland Kitchen MAGNESIUM OXIDE 400 MG PO TABS   Oral   Take 400 mg by mouth 3 (three) times daily.         Marland Kitchen METFORMIN HCL 1000 MG PO TABS   Oral   Take 1,000 mg by mouth 2 (two) times daily with a meal.         . METOPROLOL TARTRATE 50 MG PO TABS   Oral   Take 50 mg by mouth 2 (two) times daily.         Marland Kitchen POTASSIUM CHLORIDE CRYS  ER 20 MEQ PO TBCR   Oral   Take 20 mEq by mouth 2 (two) times daily.          . WARFARIN SODIUM 5 MG PO TABS   Oral   Take 5-10 mg by mouth daily. Takes 5mg  (1 tablet) daily except  on Monday Wednesday and Friday and takes 10 mg         . NITROGLYCERIN 0.4 MG SL SUBL   Sublingual   Place 0.4 mg under the tongue every 5 (five) minutes as needed. As needed for chest pain           BP 156/58  Pulse 63  Temp 98.1 F (36.7 C) (Oral)  Resp 19  SpO2 100%  Physical Exam  Constitutional: She is oriented to person, place, and time. She appears well-developed and well-nourished. No distress.  HENT:  Head: Normocephalic and atraumatic.  Eyes: Conjunctivae normal are normal. Right eye exhibits no discharge. Left eye exhibits no discharge. No scleral icterus.  Neck: Normal range of motion. Neck supple. No JVD present.  Cardiovascular: Normal rate, regular rhythm, normal heart sounds and intact distal pulses.   No murmur heard. Pulmonary/Chest: Effort normal and breath sounds normal. No respiratory distress. She has no wheezes. She has no rales. She exhibits no tenderness.  Abdominal: Soft. Bowel sounds are normal. She exhibits no distension and no mass. There is no tenderness. There is no rebound and no guarding.  Musculoskeletal: Normal range of motion. She exhibits no edema and no tenderness.  Lymphadenopathy:    She has no cervical adenopathy.  Neurological: She is alert and oriented to person, place, and time. She has normal strength. No cranial nerve deficit or sensory deficit. Coordination normal. GCS eye subscore is 4. GCS verbal subscore is 5. GCS motor subscore is 6.  Skin: Skin is warm and dry. She is not diaphoretic.  Psychiatric: She has a normal mood and affect.    ED Course  Procedures (including critical care time)  Labs Reviewed  GLUCOSE, CAPILLARY - Abnormal; Notable for the following:    Glucose-Capillary 277 (*)     All other components within normal limits    CBC WITH DIFFERENTIAL - Abnormal; Notable for the following:    Hemoglobin 9.4 (*)     HCT 31.2 (*)     MCH 24.0 (*)     RDW 15.7 (*)     All other components within normal limits  COMPREHENSIVE METABOLIC PANEL - Abnormal; Notable for the following:    Sodium 134 (*)     Glucose, Bld 275 (*)     Albumin 3.1 (*)     GFR calc non Af Amer 77 (*)     GFR calc Af Amer 90 (*)     All other components within normal limits  PROTIME-INR - Abnormal; Notable for the following:    Prothrombin Time 20.5 (*)     INR 1.83 (*)     All other components within normal limits  URINALYSIS, ROUTINE W REFLEX MICROSCOPIC - Abnormal; Notable for the following:    Glucose, UA 100 (*)     Ketones, ur 15 (*)     Leukocytes, UA SMALL (*)     All other components within normal limits  PRO B NATRIURETIC PEPTIDE - Abnormal; Notable for the following:    Pro B Natriuretic peptide (BNP) 1253.0 (*)     All other components within normal limits  GLUCOSE, CAPILLARY - Abnormal; Notable for the following:  Glucose-Capillary 275 (*)     All other components within normal limits  POCT I-STAT 3, BLOOD GAS (G3P V) - Abnormal; Notable for the following:    pH, Ven 7.486 (*)     pCO2, Ven 38.5 (*)     pO2, Ven 53.0 (*)     Bicarbonate 29.1 (*)     Acid-Base Excess 5.0 (*)     All other components within normal limits  GLUCOSE, CAPILLARY - Abnormal; Notable for the following:    Glucose-Capillary 221 (*)     All other components within normal limits  MAGNESIUM  LACTIC ACID, PLASMA  TROPONIN I  URINE MICROSCOPIC-ADD ON  BLOOD GAS, VENOUS  TSH   Dg Chest 1 View  06/24/2012  *RADIOLOGY REPORT*  Clinical Data: Hyperglycemia, short of breath  CHEST - 1 VIEW  Comparison: Chest radiograph 05/30/2012  Findings: Left-sided pacemaker overlies stable cardiac silhouette. There is improvement in the pulmonary edema pattern seen on prior. Low lung volumes.  No pneumothorax.  IMPRESSION: Improved pulmonary edema pattern.    Original Report Authenticated By: Genevive Bi, M.D.      1. Elevated brain natriuretic peptide (BNP) level   2. UTI (lower urinary tract infection)      EKG reviewed and interpreted: Sinus rhythm. No conduction blocks. ST elevations in anterior leads unchanged from prior. T wave inversions in aVL old.   MDM  Patient is a very well-appearing 78 year old female history of coronary artery disease, A. fib, pacemaker placement, diabetes who presents with nonspecific chronic complaints of "feeling poor". Vital stable. Afebrile. No concerning exam findings. Patient does not appear volume overloaded on exam. Hyperglycemia but no evidence of DKA or hyperosmolar syndrome. Blood sugar improved with IV fluids. UTI noted on urinalysis. Urine culture pending. BNP 1200 similar to her last admission value for congestive heart failure exacerbation. Pacemaker interrogated given patient had episode of dizziness this morning was standing which are feels her heart rate slowed down. There is no bradycardia on the report but interrogation does comment on a nine-hour period of atrial fibrillation with RVR with a ventricular rate approximately 150. The patient is not tachycardic at this time. Admitted to cardiology given the interrogation findings.        Consuello Masse, MD 06/25/12 [phone]

## 2012-06-25 DIAGNOSIS — Z950 Presence of cardiac pacemaker: Secondary | ICD-10-CM

## 2012-06-25 DIAGNOSIS — I2510 Atherosclerotic heart disease of native coronary artery without angina pectoris: Secondary | ICD-10-CM

## 2012-06-25 DIAGNOSIS — I4891 Unspecified atrial fibrillation: Principal | ICD-10-CM

## 2012-06-25 DIAGNOSIS — I5033 Acute on chronic diastolic (congestive) heart failure: Secondary | ICD-10-CM

## 2012-06-25 DIAGNOSIS — I495 Sick sinus syndrome: Secondary | ICD-10-CM

## 2012-06-25 LAB — GLUCOSE, CAPILLARY
Glucose-Capillary: 255 mg/dL — ABNORMAL HIGH (ref 70–99)
Glucose-Capillary: 155 mg/dL — ABNORMAL HIGH (ref 70–99)

## 2012-06-25 LAB — BASIC METABOLIC PANEL
BUN: 14 mg/dL (ref 6–23)
CO2: 25 mEq/L (ref 19–32)
Chloride: 96 mEq/L (ref 96–112)
Glucose, Bld: 329 mg/dL — ABNORMAL HIGH (ref 70–99)
Potassium: 3.6 mEq/L (ref 3.5–5.1)

## 2012-06-25 MED ORDER — SOTALOL HCL 120 MG PO TABS
120.0000 mg | ORAL_TABLET | Freq: Two times a day (BID) | ORAL | Status: DC
  Administered 2012-06-25 – 2012-06-28 (×6): 120 mg via ORAL
  Filled 2012-06-25 (×9): qty 1

## 2012-06-25 MED ORDER — AMLODIPINE BESYLATE 10 MG PO TABS
10.0000 mg | ORAL_TABLET | Freq: Every day | ORAL | Status: DC
  Administered 2012-06-26 – 2012-06-28 (×3): 10 mg via ORAL
  Filled 2012-06-25 (×3): qty 1

## 2012-06-25 MED ORDER — WARFARIN SODIUM 2.5 MG PO TABS
12.5000 mg | ORAL_TABLET | Freq: Once | ORAL | Status: AC
  Administered 2012-06-25: 12.5 mg via ORAL
  Filled 2012-06-25: qty 1

## 2012-06-25 MED ORDER — MAGNESIUM SULFATE 50 % IJ SOLN
3.0000 g | Freq: Once | INTRAVENOUS | Status: AC
  Administered 2012-06-25: 3 g via INTRAVENOUS
  Filled 2012-06-25: qty 6

## 2012-06-25 NOTE — ED Provider Notes (Addendum)
I saw and evaluated the patient, reviewed the resident's note and I agree with the findings and plan.   .Face to face Exam:  General:  Awake HEENT:  Atraumatic Resp:  Normal effort Abd:  Nondistended Neuro:No focal weakness   Nelia Shi, MD 11/01/12 2253

## 2012-06-25 NOTE — Progress Notes (Addendum)
SUBJECTIVE: The patient is doing better today.  She reports dizziness, SOB, and fatigue yesterday.   At this time, she denies chest pain, shortness of breath, or any new concerns.     Marland Kitchen amLODipine  7.5 mg Oral Daily  . clopidogrel  75 mg Oral Daily  . colesevelam  625 mg Oral QHS  . furosemide  80 mg Oral BID  . insulin aspart  10 Units Subcutaneous TID AC  . insulin detemir  26 Units Subcutaneous q morning - 10a  . isosorbide mononitrate  60 mg Oral q morning - 10a  . latanoprost  1 drop Both Eyes QHS  . magnesium oxide  400 mg Oral TID  . metFORMIN  1,000 mg Oral BID WC  . metoprolol  50 mg Oral BID  . potassium chloride SA  20 mEq Oral BID  . sodium chloride  3 mL Intravenous Q12H  . Warfarin - Pharmacist Dosing Inpatient   Does not apply q1800      OBJECTIVE: Physical Exam: Filed Vitals:   06/24/12 2147 06/25/12 0422 06/25/12 0551 06/25/12 0910  BP: 161/74 170/59 158/65 166/72  Pulse: 67 68 74 65  Temp: 97 F (36.1 C) 98 F (36.7 C)    TempSrc: Oral Oral    Resp: 18 18  18   Height: 5\' 3"  (1.6 m)     Weight: 179 lb 14.3 oz (81.6 kg) 178 lb 5.6 oz (80.9 kg)    SpO2: 95% 96%  98%    Intake/Output Summary (Last 24 hours) at 06/25/12 1216 Last data filed at 06/25/12 1053  Gross per 24 hour  Intake    480 ml  Output   1800 ml  Net  -1320 ml    Telemetry reveals intermittent atrial pacing, no arrhythmias  GEN- The patient is elderly appearing, alert and oriented x 3 today.   Head- normocephalic, atraumatic Eyes-  Sclera clear, conjunctiva pink Ears- hearing intact Oropharynx- clear Neck- supple, JVP 9cm Lungs- fine rales at the bases, normal work of breathing Heart- Regular rate and rhythm,   GI- soft, NT, ND, + BS Extremities- no clubbing, cyanosis, or edema Skin- no rash or lesion Psych- euthymic mood, full affect Neuro- strength and sensation are intact  LABS: Basic Metabolic Panel:  Basename 06/25/12 0056 06/24/12 1936 06/24/12 1600  NA 130* 133*  --  K 3.6 3.5 --  CL 96 97 --  CO2 25 27 --  GLUCOSE 329* 324* --  BUN 14 15 --  CREATININE 0.73 0.74 --  CALCIUM 9.6 9.4 --  MG -- -- 1.5  PHOS -- -- --   Liver Function Tests:  Basename 06/24/12 1936 06/24/12 1600  AST 14 15  ALT 11 11  ALKPHOS 86 84  BILITOT 0.3 0.3  PROT 7.0 6.7  ALBUMIN 3.1* 3.1*   No results found for this basename: LIPASE:2,AMYLASE:2 in the last 72 hours CBC:  Basename 06/24/12 1936 06/24/12 1600  WBC 6.1 6.6  NEUTROABS 4.2 4.9  HGB 9.9* 9.4*  HCT 32.6* 31.2*  MCV 80.1 79.6  PLT 166 171   Cardiac Enzymes:  Basename 06/25/12 0908 06/25/12 0055 06/24/12 1937  CKTOTAL -- -- --  CKMB -- -- --  CKMBINDEX -- -- --  TROPONINI <0.30 <0.30 <0.30   Thyroid Function Tests:  Basename 06/24/12 1936  TSH 9.222*  T4TOTAL --  T3FREE --  THYROIDAB --   Anemia Panel: No results found for this basename: VITAMINB12,FOLATE,FERRITIN,TIBC,IRON,RETICCTPCT in the last 72 hours  RADIOLOGY: Dg Chest  1 View  06/24/2012  *RADIOLOGY REPORT*  Clinical Data: Hyperglycemia, short of breath  CHEST - 1 VIEW  Comparison: Chest radiograph 05/30/2012  Findings: Left-sided pacemaker overlies stable cardiac silhouette. There is improvement in the pulmonary edema pattern seen on prior. Low lung volumes.  No pneumothorax.  IMPRESSION: Improved pulmonary edema pattern.   Original Report Authenticated By: Genevive Bi, M.D.    Dg Chest Portable 1 View  05/30/2012  *RADIOLOGY REPORT*  Clinical Data: Shortness of breath.  PORTABLE CHEST - 1 VIEW  Comparison: 05/09/2012  Findings: Worsening of diffuse symmetric airspace disease is seen, consistent with increased pulmonary edema.  Cardiomegaly remains stable.  The dual lead transvenous pacemaker remains in appropriate position.  IMPRESSION:  1.  Worsening diffuse symmetric airspace disease/edema. 2.  Stable cardiomegaly.   Original Report Authenticated By: Myles Rosenthal, M.D.     ASSESSMENT AND PLAN:  Active Problems:  * No  active hospital problems. *   1. Atrial fibrillation/ atrial flutter-  Her symptoms yesterday temporally correspond to atrial flutter seen on her pacemaker.  Her device is functioning normally.   She has not tolerated tikosyn or amiodarone previously.  Given CAD, I would not advise Ic agents.  She is not a candidate for ablation due to her advanced age, lung disease, and comorbidities.  I think that the best option at this time is sotalol.  I will initiate sotalol 120mg  BID and follow Qtc over the next 3 days.  Continue long term anticoagulation with coumadin.  Continue metoprolol and monitor heart rates.   2. Acute on chronic diastolic dysfunction CHF EF 55-60^ by echo  Weight is up 8 lbs from 06/08/12 Lasix has been increased to 80mg  BID Salt restriction advised  3. CAD - stable   4. DM stable   5. HTN Above goal Increase amlodipine to 10mg  daily  6. Chronic systemic anticoagulation - INR subtherapeutic   7. Hypothyroidism TSH slowly improving when compared to earlier this month No changes at this time  8.  Anemia stable  9.  Tachycardia/ bradycardia s/p PPM Pacemaker interrogation is reviewed and demonstrates normal device function  Will likely be in the hospital until Wed AM for initiation of sotalol  Hillis Range, MD 06/25/2012 12:16 PM

## 2012-06-25 NOTE — Progress Notes (Signed)
ANTICOAGULATION CONSULT NOTE - Initial Consult  Pharmacy Consult for coumadin Indication: atrial fibrillation  Allergies  Allergen Reactions  . Ace Inhibitors Cough    Enalapril  benicor    . Crestor (Rosuvastatin Calcium) Other (See Comments)    Fatigue and leg pain   . Lipitor (Atorvastatin Calcium)     Myalgias   . Niaspan (Niacin) Other (See Comments)    unknown  . Olmesartan Cough  . Prednisone     REACTION: "retained fluid"    Patient Measurements:  Vital Signs: Temp: 98 F (36.7 C) (12/29 0422) Temp src: Oral (12/29 0422) BP: 166/72 mmHg (12/29 0910) Pulse Rate: 65  (12/29 0910)  Labs:  [name] 06/25/12 0908 06/25/12 0056 06/25/12 0055 06/24/12 1937 06/24/12 1936 06/24/12 1600  HGB -- -- -- -- 9.9* 9.4*  HCT -- -- -- -- 32.6* 31.2*  PLT -- -- -- -- 166 171  APTT -- -- -- -- -- --  LABPROT -- 19.6* -- -- -- 20.5*  INR -- 1.72* -- -- -- 1.83*  HEPARINUNFRC -- -- -- -- -- --  CREATININE -- 0.73 -- -- 0.74 0.77  CKTOTAL -- -- -- -- -- --  CKMB -- -- -- -- -- --  TROPONINI <0.30 -- <0.30 <0.30 -- --    Estimated Creatinine Clearance: 56.5 ml/min (by C-G formula based on Cr of 0.73).   Assessment: 82 yoF on chronic coumadin for afib. INR subtherapeutic at 1.72 today.  PTA dose: 5mg  daily except on Monday Wednesday and Friday and takes 10 mg  Goal of Therapy:  INR 2-3 Monitor platelets by anticoagulation protocol: Yes   Plan:  Coumadin 12.5 mg PO x1 tonight Daily INR  Bayard Hugger, PharmD, BCPS  Clinical Pharmacist  Pager: [phone]  06/25/2012,12:00 PM

## 2012-06-25 NOTE — Progress Notes (Signed)
Admitted pt to rm [id number] from ED, oriented to room, call bell placed within reach, admission assessment done, orders carried out. Will continue to monitor.

## 2012-06-26 LAB — BASIC METABOLIC PANEL
Calcium: 10.2 mg/dL (ref 8.4–10.5)
Creatinine, Ser: 0.89 mg/dL (ref 0.50–1.10)
GFR calc non Af Amer: 60 mL/min — ABNORMAL LOW (ref >90)
Sodium: 135 mEq/L (ref 135–145)

## 2012-06-26 LAB — GLUCOSE, CAPILLARY
Glucose-Capillary: 136 mg/dL — ABNORMAL HIGH (ref 70–99)
Glucose-Capillary: 111 mg/dL — ABNORMAL HIGH (ref 70–99)
Glucose-Capillary: 165 mg/dL — ABNORMAL HIGH (ref 70–99)

## 2012-06-26 LAB — PROTIME-INR
INR: 2.21 — ABNORMAL HIGH (ref 0.00–1.49)
Prothrombin Time: 23.6 seconds — ABNORMAL HIGH (ref 11.6–15.2)

## 2012-06-26 MED ORDER — WARFARIN SODIUM 10 MG PO TABS
10.0000 mg | ORAL_TABLET | Freq: Once | ORAL | Status: AC
  Administered 2012-06-26: 10 mg via ORAL
  Filled 2012-06-26: qty 1

## 2012-06-26 NOTE — Progress Notes (Signed)
[facility]  Patient Status: Active (receiving services up to time of hospitalization)  [facility] is providing the following services: RN and HHA  If patient discharges after hours, please call [phone].   Wynelle Bourgeois 06/26/2012, 3:32 PM

## 2012-06-26 NOTE — Progress Notes (Signed)
EKG @1100  showing acute mi and v5 lead changes. PA for Dr. Patty Sermons notified and is aware. Patient in stable condition.

## 2012-06-26 NOTE — Progress Notes (Signed)
Inpatient Diabetes Program Recommendations  AACE/ADA: New Consensus Statement on Inpatient Glycemic Control (2013)  Target Ranges:  Prepandial:   less than 140 mg/dL      Peak postprandial:   less than 180 mg/dL (1-2 hours)      Critically ill patients:  140 - 180 mg/dL   Reason for Visit: Hyperglycemia  Inpatient Diabetes Program Recommendations Correction (SSI): Add Novolog sensitive tidwc Diet: Add CHO mod med to heart healthy diet  Note: Will follow.

## 2012-06-26 NOTE — Progress Notes (Signed)
Patient evaluated for community based chronic disease management services with [facility] Care Management Program as a benefit of patient's [employer].  Patient currently has [facility] for [facility] services.  She is currently on telehealth monitoring.  Patient has formally declined services at this time as she feels that she can self manage her care at home with family support.  Spoke with patient at bedside to explain [facility] Care Management services. Left contact information and [facility] literature at bedside. Made inpatient Case Manager aware of [facility] Care Management consult. Of note, [facility] Care Management services does not replace or interfere with any services that are arranged by inpatient case management or social work.  For additional questions or referrals please contact Anibal Henderson BSN RN [facility] Liaison at [phone].

## 2012-06-26 NOTE — Care Management Note (Signed)
    Page 1 of 1   06/26/2012     11:13:35 AM   CARE MANAGEMENT NOTE 06/26/2012  Patient:  Alexandria [name], Alexandria [name]   Account Number:  [id number]  Date Initiated:  06/26/2012  Documentation initiated by:  Tera Mater  Subjective/Objective Assessment:   78yo female admitted with AFIB.  Pt. lives at home alone in [place].     Action/Plan:   Prior to admission, pt. was seen by [facility] for [facility] RN for HF management.  Will obtain Resumption of Care orders   Anticipated DC Date:  06/27/2012   Anticipated DC Plan:  HOME W HOME HEALTH SERVICES      DC Planning Services  CM consult      [facility] Choice  Resumption Of Svcs/PTA Provider   Choice offered to / List presented to:             [facility] agency  [facility]   Status of Alexandria [name]:  In process, will continue to follow Medicare Important Message given?   (If response is "NO", the following Medicare IM given date fields will be blank) Date Medicare IM given:   Date Additional Medicare IM given:    Discharge Disposition:    Per UR Regulation:  Reviewed for med. necessity/level of care/duration of stay  If discussed at Long Length of Stay Meetings, dates discussed:    Comments:  06/26/12 1100  TC to Hilda Lias, with [facility] to make aware of pt. admission.  Pt. is being [facility] by [facility] for [facility] RN for HF management.  Will obtain resumption of care orders.  Pt. was placed on Sotalo yesterday, pt. will need a 3 day INPT stay to closely monitor QTC. Tera Mater, RN, BSN NCM [phone]

## 2012-06-26 NOTE — Progress Notes (Signed)
ANTICOAGULATION CONSULT NOTE   Pharmacy Consult for coumadin Indication: atrial fibrillation  Allergies  Allergen Reactions  . Ace Inhibitors Cough    Enalapril  benicor    . Crestor (Rosuvastatin Calcium) Other (See Comments)    Fatigue and leg pain   . Lipitor (Atorvastatin Calcium)     Myalgias   . Niaspan (Niacin) Other (See Comments)    unknown  . Olmesartan Cough  . Prednisone     REACTION: "retained fluid"   Labs:  Basename 06/26/12 0605 06/25/12 0908 06/25/12 0056 06/25/12 0055 06/24/12 1937 06/24/12 1936 06/24/12 1600  HGB -- -- -- -- -- 9.9* 9.4*  HCT -- -- -- -- -- 32.6* 31.2*  PLT -- -- -- -- -- 166 171  APTT -- -- -- -- -- -- --  LABPROT 23.6* -- 19.6* -- -- -- 20.5*  INR 2.21* -- 1.72* -- -- -- 1.83*  HEPARINUNFRC -- -- -- -- -- -- --  CREATININE 0.89 -- 0.73 -- -- 0.74 --  CKTOTAL -- -- -- -- -- -- --  CKMB -- -- -- -- -- -- --  TROPONINI -- <0.30 -- <0.30 <0.30 -- --    Estimated Creatinine Clearance: 50.2 ml/min (by C-G formula based on Cr of 0.89).  Assessment: 82 yoF on chronic coumadin for afib.  INR=2.21 PTA dose: 5mg  daily except on Monday Wednesday and Friday and takes 10 mg  Goal of Therapy:  INR 2-3 Monitor platelets by anticoagulation protocol: Yes   Plan:  Coumadin 10 mg PO x1 tonight Daily INR  Thank you. Okey Regal, PharmD [phone]  06/26/2012,8:48 AM

## 2012-06-26 NOTE — Progress Notes (Signed)
Subjective:  Feels well.  No further atrial fib. Now on sotalol. EKG pending.  Objective:  Vital Signs in the last 24 hours: Temp:  [97.2 F (36.2 C)-98.7 F (37.1 C)] 97.2 F (36.2 C) (12/30 0528) Pulse Rate:  [64-65] 65  (12/30 0528) Resp:  [18-20] 18  (12/30 0528) BP: (112-166)/(45-80) 151/80 mmHg (12/30 0528) SpO2:  [97 %-99 %] 98 % (12/30 0528) Weight:  [174 lb 9.7 oz (79.2 kg)] 174 lb 9.7 oz (79.2 kg) (12/30 0528)  Intake/Output from previous day: 12/29 0701 - 12/30 0700 In: 840 [P.O.:840] Out: 3200 [Urine:3200] Intake/Output from this shift:       . amLODipine  10 mg Oral Daily  . clopidogrel  75 mg Oral Daily  . colesevelam  625 mg Oral QHS  . furosemide  80 mg Oral BID  . insulin aspart  10 Units Subcutaneous TID AC  . insulin detemir  26 Units Subcutaneous q morning - 10a  . isosorbide mononitrate  60 mg Oral q morning - 10a  . latanoprost  1 drop Both Eyes QHS  . magnesium oxide  400 mg Oral TID  . metFORMIN  1,000 mg Oral BID WC  . metoprolol  50 mg Oral BID  . potassium chloride SA  20 mEq Oral BID  . sodium chloride  3 mL Intravenous Q12H  . sotalol  120 mg Oral Q12H  . Warfarin - Pharmacist Dosing Inpatient   Does not apply q1800      Physical Exam: The patient appears to be in no distress.  Head and neck exam reveals that the pupils are equal and reactive.  The extraocular movements are full.  There is no scleral icterus.  Mouth and pharynx are benign.  No lymphadenopathy.  No carotid bruits.  The jugular venous pressure is normal.  Thyroid is not enlarged or tender.  Chest reveals mild inspiratory rales.  Expansion of the chest is symmetrical.  Heart reveals no abnormal lift or heave.  First and second heart sounds are normal.  There is no murmur gallop rub or click.  The abdomen is soft and nontender.  Bowel sounds are normoactive.  There is no hepatosplenomegaly or mass.  There are no abdominal bruits.  Extremities reveal no phlebitis or  edema.  Pedal pulses are good.  There is no cyanosis or clubbing.  Neurologic exam is normal strength and no lateralizing weakness.  No sensory deficits.  Integument reveals no rash  Lab Results:  Basename 06/24/12 1936 06/24/12 1600  WBC 6.1 6.6  HGB 9.9* 9.4*  PLT 166 171    Basename 06/26/12 0605 06/25/12 0056  NA 135 130*  K 3.6 3.6  CL 97 96  CO2 27 25  GLUCOSE 137* 329*  BUN 19 14  CREATININE 0.89 0.73    Basename 06/25/12 0908 06/25/12 0055  TROPONINI <0.30 <0.30   Hepatic Function Panel  Basename 06/24/12 1936  PROT 7.0  ALBUMIN 3.1*  AST 14  ALT 11  ALKPHOS 86  BILITOT 0.3  BILIDIR --  IBILI --   No results found for this basename: CHOL in the last 72 hours No results found for this basename: PROTIME in the last 72 hours  Imaging: Imaging results have been reviewed  Cardiac Studies:  Assessment/Plan:  Patient Active Hospital Problem List:  CAD (06/16/2009) Stable Atrial fibrillation (09/05/2009)   Assessment:Remaining in paced rhythm.   Plan: Continue sotalol Acute on chronic diastolic heart failure (05/20/2011)   Assessment: stable   Plan: Continue  lasix    LOS: 2 days    Cassell Clement 06/26/2012, 8:12 AM

## 2012-06-26 NOTE — Progress Notes (Signed)
S: RN called regarding abnormal EKG finding. Patient denies cp, worsening SOB, palpitations or lightheadedness.   O:   Filed Vitals:   06/26/12 1059  BP: 131/62  Pulse: 69  Temp: 98.1 F (36.7 C)  Resp: 16   General: NAD  HEENT: resting head tremor, normocephalic, atraumatic  Neck: no carotid bruits, no JVD Lungs: bibasilar R>L course dry rales, no wheezing or rhonchi, no increased respiratory effort CV: RRR, no S3, S4, rubs, heaves, rubs or thrills Abdomen: soft, nontender, nondistended with normoactive BS x 4  Extremities: trace bilateral pedal edema, no cyanosis or clubbing Neuro: moves all extremities spontaneously, a&o x 3 Psych: responds to questions appropriately with a normal affect  EKG: A-paced, V-sensed, Q waves V1-V4 unchanged, IVCD II, III, aVF with chronic 1 mm ST elevation (unchanged from prior tracings), isolated V5 artifact Telemetry: maintaining a-paced rhythm, no evidence of atrial fibrillation   Assessment:   Abnormal EKG finding  Plan:  EKG abnormality on review reveals chronic ST elevation in inferior leads and IVCD unchanged from prior tracings. On interviewing, she denies ischemic symptoms, and in fact, reports improved shortness of breath today. Cath in 12/2011 revealed a stenotic 90% D1 vessel, 95% OM3 disease both deemed too small for PCI, patent prox LCx and diffuse nonobstructive CAD elsewhere. She has been medically managed. TnI x 3 have been WNL this admission. Continue current meds and plan outlined by Dr. Patty Sermons this morning. Will continue to monitor.

## 2012-06-27 ENCOUNTER — Encounter (INDEPENDENT_AMBULATORY_CARE_PROVIDER_SITE_OTHER): Payer: Medicare Other | Admitting: Ophthalmology

## 2012-06-27 LAB — GLUCOSE, CAPILLARY
Glucose-Capillary: 149 mg/dL — ABNORMAL HIGH (ref 70–99)
Glucose-Capillary: 82 mg/dL (ref 70–99)
Glucose-Capillary: 209 mg/dL — ABNORMAL HIGH (ref 70–99)

## 2012-06-27 LAB — PROTIME-INR: Prothrombin Time: 27.2 seconds — ABNORMAL HIGH (ref 11.6–15.2)

## 2012-06-27 LAB — BASIC METABOLIC PANEL
Calcium: 10.1 mg/dL (ref 8.4–10.5)
Creatinine, Ser: 0.88 mg/dL (ref 0.50–1.10)
GFR calc Af Amer: 70 mL/min — ABNORMAL LOW (ref >90)

## 2012-06-27 MED ORDER — WARFARIN SODIUM 5 MG PO TABS
5.0000 mg | ORAL_TABLET | Freq: Once | ORAL | Status: AC
  Administered 2012-06-27: 5 mg via ORAL
  Filled 2012-06-27: qty 1

## 2012-06-27 NOTE — Progress Notes (Signed)
   SUBJECTIVE:  She says that she is not currently having any dizziness and her breathing is at baseline.    PHYSICAL EXAM Filed Vitals:   06/26/12 1059 06/26/12 1346 06/26/12 2031 06/27/12 0612  BP: 131/62 117/51 119/59 127/54  Pulse: 69 63 65 64  Temp: 98.1 F (36.7 C) 97.6 F (36.4 C) 97.8 F (36.6 C) 97 F (36.1 C)  TempSrc: Oral Oral Oral Oral  Resp: 16 18 18 20   Height:      Weight:    174 lb 2.6 oz (79 kg)  SpO2: 99% 100% 99% 100%   General:  No distress Lungs:  Basilar crackles unchanged. Heart:  RRR Abdomen:  Positive bowel sounds, no rebound no guarding Extremities:  No edema.   LABS: Lab Results  Component Value Date   CKTOTAL 27 05/30/2012   CKMB 3.0 05/30/2012   TROPONINI <0.30 06/25/2012   Results for orders placed during the hospital encounter of 06/24/12 (from the past 24 hour(s))  GLUCOSE, CAPILLARY     Status: Abnormal   Collection Time   06/26/12 11:43 AM      Component Value Range   Glucose-Capillary 111 (*) 70 - 99 mg/dL   Comment 1 Notify RN    GLUCOSE, CAPILLARY     Status: Abnormal   Collection Time   06/26/12  4:36 PM      Component Value Range   Glucose-Capillary 165 (*) 70 - 99 mg/dL   Comment 1 Notify RN    GLUCOSE, CAPILLARY     Status: Abnormal   Collection Time   06/26/12  8:34 PM      Component Value Range   Glucose-Capillary 193 (*) 70 - 99 mg/dL   Comment 1 Notify RN     Comment 2 Documented in Chart    GLUCOSE, CAPILLARY     Status: Abnormal   Collection Time   06/27/12  6:19 AM      Component Value Range   Glucose-Capillary 115 (*) 70 - 99 mg/dL   Comment 1 Notify RN      Intake/Output Summary (Last 24 hours) at 06/27/12 0820 Last data filed at 06/27/12 0617  Gross per 24 hour  Intake   1080 ml  Output   1951 ml  Net   -871 ml    Telemetry:  No further atrial fibrillation  ASSESSMENT AND PLAN:  PAF with RVR:  She is now on sotalol per Dr. Johney Frame.  She will be observed 72 hours.  INR is therapeutic.   Chronic  diastolics HF/elevated pro BNP:  Breathing is OK. Continue current therapy.    CAD:  No acute ischemic symptoms.  Continue current therapy.  She remains on Plavix.      Fayrene Fearing Tyanna Hach 06/27/2012 8:20 AM

## 2012-06-27 NOTE — Progress Notes (Signed)
Pt CBG 82mg /dl.  Asymptomatic.  C.Berge PA informed .  Instructed to give pt metformin and hold the novolog 10units tonight.

## 2012-06-27 NOTE — Progress Notes (Signed)
ANTICOAGULATION CONSULT NOTE   Pharmacy Consult for coumadin Indication: atrial fibrillation  Allergies  Allergen Reactions  . Ace Inhibitors Cough    Enalapril  benicor    . Crestor (Rosuvastatin Calcium) Other (See Comments)    Fatigue and leg pain   . Lipitor (Atorvastatin Calcium)     Myalgias   . Niaspan (Niacin) Other (See Comments)    unknown  . Olmesartan Cough  . Prednisone     REACTION: "retained fluid"    Labs:  Basename 06/27/12 0800 06/26/12 0605 06/25/12 0908 06/25/12 0056 06/25/12 0055 06/24/12 1937 06/24/12 1936 06/24/12 1600  HGB -- -- -- -- -- -- 9.9* 9.4*  HCT -- -- -- -- -- -- 32.6* 31.2*  PLT -- -- -- -- -- -- 166 171  APTT -- -- -- -- -- -- -- --  LABPROT 27.2* 23.6* -- 19.6* -- -- -- --  INR 2.68* 2.21* -- 1.72* -- -- -- --  HEPARINUNFRC -- -- -- -- -- -- -- --  CREATININE 0.88 0.89 -- 0.73 -- -- -- --  CKTOTAL -- -- -- -- -- -- -- --  CKMB -- -- -- -- -- -- -- --  TROPONINI -- -- <0.30 -- <0.30 <0.30 -- --    Estimated Creatinine Clearance: 50.7 ml/min (by C-G formula based on Cr of 0.88).  Assessment: 82 yoF on chronic coumadin for afib.  INR=2.21 PTA dose: 5mg  daily except on Monday Wednesday and Friday and takes 10 mg  Goal of Therapy:  INR 2-3 Monitor platelets by anticoagulation protocol: Yes   Plan:  Coumadin 5 mg PO x1 tonight Daily INR  Thank you. Okey Regal, PharmD [phone]  06/27/2012,10:12 AM

## 2012-06-28 DIAGNOSIS — I5043 Acute on chronic combined systolic (congestive) and diastolic (congestive) heart failure: Secondary | ICD-10-CM

## 2012-06-28 LAB — BASIC METABOLIC PANEL
BUN: 26 mg/dL — ABNORMAL HIGH (ref 6–23)
Calcium: 10.3 mg/dL (ref 8.4–10.5)
GFR calc non Af Amer: 58 mL/min — ABNORMAL LOW (ref >90)
Glucose, Bld: 166 mg/dL — ABNORMAL HIGH (ref 70–99)

## 2012-06-28 LAB — PROTIME-INR
INR: 2.99 — ABNORMAL HIGH (ref 0.00–1.49)
Prothrombin Time: 29.5 seconds — ABNORMAL HIGH (ref 11.6–15.2)

## 2012-06-28 LAB — GLUCOSE, CAPILLARY

## 2012-06-28 MED ORDER — SOTALOL HCL 80 MG PO TABS: 80.0000 mg | ORAL_TABLET | Freq: Two times a day (BID) | ORAL | Status: DC

## 2012-06-28 MED ORDER — SOTALOL HCL 80 MG PO TABS
80.0000 mg | ORAL_TABLET | Freq: Two times a day (BID) | ORAL | Status: DC
  Filled 2012-06-28 (×2): qty 1

## 2012-06-28 MED ORDER — AMLODIPINE BESYLATE 10 MG PO TABS: 10.0000 mg | ORAL_TABLET | Freq: Every day | ORAL | Status: DC

## 2012-06-28 MED ORDER — FUROSEMIDE 80 MG PO TABS: 80.0000 mg | ORAL_TABLET | Freq: Two times a day (BID) | ORAL | Status: AC

## 2012-06-28 NOTE — Progress Notes (Signed)
    SUBJECTIVE:  She denies any chest pain or SOB.    PHYSICAL EXAM Filed Vitals:   06/27/12 0941 06/27/12 1351 06/27/12 2107 06/28/12 0436  BP:  127/47 122/59 139/65  Pulse: 69 68 69 62  Temp:  97.4 F (36.3 C) 97.9 F (36.6 C) 97.8 F (36.6 C)  TempSrc:  Oral Oral Oral  Resp:  20 20 18   Height:      Weight:    174 lb 9.7 oz (79.2 kg)  SpO2:  97% 97% 99%   General:  No distress Lungs:  Basilar crackles improved. Heart:  RRR Abdomen:  Positive bowel sounds, no rebound no guarding Extremities:  No edema.   LABS: Lab Results  Component Value Date   CKTOTAL 27 05/30/2012   CKMB 3.0 05/30/2012   TROPONINI <0.30 06/25/2012   Results for orders placed during the hospital encounter of 06/24/12 (from the past 24 hour(s))  GLUCOSE, CAPILLARY     Status: Abnormal   Collection Time   06/27/12 11:03 AM      Component Value Range   Glucose-Capillary 149 (*) 70 - 99 mg/dL  GLUCOSE, CAPILLARY     Status: Normal   Collection Time   06/27/12  4:06 PM      Component Value Range   Glucose-Capillary 82  70 - 99 mg/dL  GLUCOSE, CAPILLARY     Status: Abnormal   Collection Time   06/27/12  9:25 PM      Component Value Range   Glucose-Capillary 209 (*) 70 - 99 mg/dL   Comment 1 Notify RN    PROTIME-INR     Status: Abnormal   Collection Time   06/28/12  4:55 AM      Component Value Range   Prothrombin Time 29.5 (*) 11.6 - 15.2 seconds   INR 2.99 (*) 0.00 - 1.49  GLUCOSE, CAPILLARY     Status: Abnormal   Collection Time   06/28/12  6:06 AM      Component Value Range   Glucose-Capillary 123 (*) 70 - 99 mg/dL    Intake/Output Summary (Last 24 hours) at 06/28/12 0826 Last data filed at 06/28/12 0600  Gross per 24 hour  Intake    972 ml  Output   2180 ml  Net  -1208 ml    Telemetry:  No further atrial fibrillation.  EKG:  NSR,  QTc is slightly prolonged.  06/28/2012  ASSESSMENT AND PLAN:  PAF with RVR:  She is now on Sotalol per Dr. Johney Frame. INR is therapeutic.  QT is slightly  higher.  Discharge on 80 mg bid of Sotalol.  OK to go home today.  She has follow up with me in 7 days.    Chronic diastolics HF/elevated pro BNP:  Breathing is OK. Continue current therapy.  Weight is down 5 pounds since admission.  Check BMET before discharge.  Discharge on previous Lasix dose.    CAD:  No acute ischemic symptoms.  Continue current therapy.  She remains on Plavix.      Fayrene Fearing [facility] 06/28/2012 8:26 AM

## 2012-06-28 NOTE — Progress Notes (Signed)
Went over all discharge instructions with pt including follow up visits, home meds, and when to call the MD. Reviewed the Heart Failure pt guide and daily weights plus diet, nutrition and reduction in salt intake. Pt states ever once in a while she still eats ham or a ham biscuit sandwich. Pt also states she has a set up in her home that when she weighs daily it goes directly to a DR's office and is recorded. Reviewed the 2 to 3 lb day or 5 lb in a week gain and if someone from the office doesn't call her she needs to generate a call.

## 2012-06-28 NOTE — Discharge Summary (Signed)
Physician Discharge Summary  Patient ID: Alexandria [name] MRN: [id number] DOB/AGE: 01/25/1930 79 y.o.  Admit date: 06/24/2012 Discharge date: 06/28/2012  Primary Discharge Diagnosis: 1. PAF 2. Chronic coumadin therapy Secondary Discharge Diagnosis 1. Acute on Chronic Diastolic CHF 2. Diabetes 3. Bradycardia 4. Pacemaker in situ   Significant Diagnostic Studies:  Consults:  1. EP-Dr.Allred   Hospital Course:    Alexandria [name] is a 79 year old patient of Dr. Sarina Ser, with known history of diastolic CHF, CAD, tachybradycardia syndrome. She was admitted from the office secondary to hypertension with systolic blood pressure greater than 200. Her O2 sats were in the 70s. Prior to coming to the ER she was treated with nitroglycerin sublingual and bronchodilators. She was transported to [facility] with STEMI but on arrival to the ER she did not have EKG indicative of acute coronary syndrome. Chest x-ray revealed that she had acute pulmonary edema, with CHF. Pro BNP was found to be 1253. She has pacemaker in situ, and interrogation revealed high AV rates episodes with mode switching over the last 9 hours prior to her admission. She was found to have paroxysmal atrial fibrillation with RVR. She had not tolerated to consider amiodarone in the past.    Consultation with Dr. Hillis Range was completed on 06/25/12. It was his advisement that she begin on sotalol milligrams twice a day with followup QTC her EKG over the next 3 days after his assessment. She was to continue long-term anticoagulation with Coumadin. She was also to continue on metoprolol. On 06/26/2012 she was seen by Arguello PA, secondary to ST elevation in inferior leads, with no ischemic symptoms. No changes were made her medication regimen she was continued to be monitored.    She was also started on diuresis using IV Lasix, with a 5 pound weight loss during hospitalization. She will remain on Lasix 80 mg twice a day with followup BMET and  Ronalee Scheunemann Hochrien's  office in one week. She is to continue daily weights and avoidance of salt. Sotalol was decreased to 80 mg twice a day prior to discharge. BMET drawn morning of discharge revealed a sodium of 137, potassium of 4.1, chloride 97, CO2 27, BUN 26, creatinine 0.91. Norvasc dose was also increased from 5 mg to 10 mg daily. She was seen and examined by Dr. Conehatta Lions prior to discharge and found to be stable. She is to continue her other medications she was taking prior to admission. Prescription for sotalol and increased dose of Lasix were provided to the patient.    Discharge Exam: Blood pressure 132/62, pulse 68, temperature 97.8 F (36.6 C), temperature source Oral, resp. rate 18, height 5\' 3"  (1.6 m), weight 174 lb 9.7 oz (79.2 kg), SpO2 99.00%. Please see Dr. Saul Fordyce rounding note for discharge assessment. Labs:   Lab Results  Component Value Date   WBC 6.1 06/24/2012   HGB 9.9* 06/24/2012   HCT 32.6* 06/24/2012   MCV 80.1 06/24/2012   PLT 166 06/24/2012    Lab 06/28/12 0850 06/24/12 1936  NA 137 --  K 4.1 --  CL 97 --  CO2 27 --  BUN 26* --  CREATININE 0.91 --  CALCIUM 10.3 --  PROT -- 7.0  BILITOT -- 0.3  ALKPHOS -- 86  ALT -- 11  AST -- 14  GLUCOSE 166* --   Lab Results  Component Value Date   CKTOTAL 27 05/30/2012   CKMB 3.0 05/30/2012   TROPONINI <0.30 06/25/2012    Lab Results  Component Value  Date   CHOL 186 01/22/2012   CHOL 205* 08/15/2011   CHOL 185 05/21/2011   Lab Results  Component Value Date   HDL 50 01/22/2012   HDL 38* 08/15/2011   HDL 47 05/21/2011   Lab Results  Component Value Date   LDLCALC 102* 01/22/2012   LDLCALC 127* 08/15/2011   LDLCALC 111* 05/21/2011   Lab Results  Component Value Date   TRIG 169* 01/22/2012   TRIG 201* 08/15/2011   TRIG 133 05/21/2011   Lab Results  Component Value Date   CHOLHDL 3.7 01/22/2012   CHOLHDL 5.4 08/15/2011   CHOLHDL 3.9 05/21/2011   No results found for this basename: LDLDIRECT        Radiology: Dg Chest 1 View  06/24/2012  *RADIOLOGY REPORT*  Clinical Data: Hyperglycemia, short of breath  CHEST - 1 VIEW  Comparison: Chest radiograph 05/30/2012  Findings: Left-sided pacemaker overlies stable cardiac silhouette. There is improvement in the pulmonary edema pattern seen on prior. Low lung volumes.  No pneumothorax.  IMPRESSION: Improved pulmonary edema pattern.   Original Report Authenticated By: Genevive Bi, M.D.    Dg Chest Portable 1 View  05/30/2012  *RADIOLOGY REPORT*  Clinical Data: Shortness of breath.  PORTABLE CHEST - 1 VIEW  Comparison: 05/09/2012  Findings: Worsening of diffuse symmetric airspace disease is seen, consistent with increased pulmonary edema.  Cardiomegaly remains stable.  The dual lead transvenous pacemaker remains in appropriate position.  IMPRESSION:  1.  Worsening diffuse symmetric airspace disease/edema. 2.  Stable cardiomegaly.   Original Report Authenticated By: Myles Rosenthal, M.D.     EAV:[id number] atrial pacemaker QT 480/QTC 483   FOLLOW UP PLANS AND APPOINTMENTS Discharge Orders    Future Appointments: Provider: Department: Dept Phone: Center:   07/05/2012 11:30 AM [name], MD [facility] [phone] [id number]   07/06/2012 1:30 PM Sherrie George, MD [facility] [phone] None   01/17/2013 1:00 PM [facility] [phone] [facility]   01/17/2013 2:00 PM Evern Bio, NP [facility] [phone] [facility]     Future Orders Please Complete By Expires   Diet - low sodium heart healthy      Diet - low sodium heart healthy      Increase activity slowly      Increase activity slowly      Beta Blocker already ordered      Contraindication to ACEI at discharge          Medication List     As of 06/28/2012 12:08 PM    TAKE these medications         amLODipine 10 MG tablet   Commonly known as: NORVASC   Take 1 tablet (10 mg  total) by mouth daily.      cephALEXin 500 MG capsule   Commonly known as: KEFLEX   Take 1 capsule (500 mg total) by mouth 4 (four) times daily.      clopidogrel 75 MG tablet   Commonly known as: PLAVIX   Take 75 mg by mouth daily.      colesevelam 625 MG tablet   Commonly known as: WELCHOL   Take 625 mg by mouth at bedtime.      fish oil-omega-3 fatty acids 1000 MG capsule   Take 1 g by mouth at bedtime.      furosemide 80 MG tablet   Commonly known as: LASIX   Take 1 tablet (80  mg total) by mouth 2 (two) times daily.      HEMOCYTE PLUS PO   Take 1 tablet by mouth daily.      insulin detemir 100 UNIT/ML injection   Commonly known as: LEVEMIR   Inject 26 Units into the skin every morning.      isosorbide mononitrate 60 MG 24 hr tablet   Commonly known as: IMDUR   Take 60 mg by mouth every morning.      latanoprost 0.005 % ophthalmic solution   Commonly known as: XALATAN   Place 1 drop into both eyes at bedtime.      magnesium oxide 400 MG tablet   Commonly known as: MAG-OX   Take 400 mg by mouth 3 (three) times daily.      metFORMIN 1000 MG tablet   Commonly known as: GLUCOPHAGE   Take 1,000 mg by mouth 2 (two) times daily with a meal.      metoprolol 50 MG tablet   Commonly known as: LOPRESSOR   Take 50 mg by mouth 2 (two) times daily.      nitroGLYCERIN 0.4 MG SL tablet   Commonly known as: NITROSTAT   Place 0.4 mg under the tongue every 5 (five) minutes as needed. As needed for chest pain      NOVOLOG FLEXPEN 100 UNIT/ML injection   Generic drug: insulin aspart   Inject 10 Units into the skin 3 (three) times daily before meals.      potassium chloride SA 20 MEQ tablet   Commonly known as: K-DUR,KLOR-CON   Take 20 mEq by mouth 2 (two) times daily.      sotalol 80 MG tablet   Commonly known as: BETAPACE   Take 1 tablet (80 mg total) by mouth 2 (two) times daily.      VITAMIN B-12 IJ   Inject 1 application as directed every 30 (thirty) days. Given at  the doctor's office at the end of each month      warfarin 5 MG tablet   Commonly known as: COUMADIN   Take 5-10 mg by mouth daily. Takes 5mg  (1 tablet) daily except  on Monday Wednesday and Friday and takes 10 mg           Follow-up Information    Follow up with [name], MD. (Follow up with Cardiology on Monday. You will be called for an appointment. Return to ED if your symptoms worsen or if you develop chest pain or shortness of breath. )    Contact information:   [address] [place] [address] [phone]       Follow up with [name], MD. (Our office will call you for appointment for one week.)    Contact information:   [address] [place] [address] [phone]          Time spent with patient to include physician time:45 minutes Signed: Joni Reining 06/28/2012, 12:08 PM Co-Sign MD  I saw and examined the patient and formulated the plan for discharge.  See the rounding note from earlier today.

## 2012-06-28 NOTE — Progress Notes (Signed)
Pt discharged per w/c with all belongings and prescriptions. Accompanied by staff and family member.

## 2012-06-29 ENCOUNTER — Telehealth: Payer: Self-pay | Admitting: Cardiology

## 2012-06-29 MED ORDER — SOTALOL HCL 80 MG PO TABS: 80.0000 mg | ORAL_TABLET | Freq: Two times a day (BID) | ORAL | Status: DC

## 2012-06-29 NOTE — Telephone Encounter (Signed)
Pt didn't need a RX - she found hers.  Doing well, HH Nurse came in today, she is taking her medicatins as ordered and not having any problems.  She states she slept the best last night that she has in a long time.  Pt has an appointment 07/05/12 in the [place] office.

## 2012-06-29 NOTE — Telephone Encounter (Signed)
Pt just got out of the hospital and was given rx for  Sotalol, only gave her one pill but didn't give her rx, needs called in asap today to [facility]

## 2012-07-05 ENCOUNTER — Encounter: Payer: Self-pay | Admitting: Cardiology

## 2012-07-05 ENCOUNTER — Ambulatory Visit (INDEPENDENT_AMBULATORY_CARE_PROVIDER_SITE_OTHER): Payer: Medicare Other | Admitting: Cardiology

## 2012-07-05 VITALS — BP 132/64 | HR 84 | Ht 63.0 in | Wt 179.0 lb

## 2012-07-05 DIAGNOSIS — I10 Essential (primary) hypertension: Secondary | ICD-10-CM

## 2012-07-05 DIAGNOSIS — I2510 Atherosclerotic heart disease of native coronary artery without angina pectoris: Secondary | ICD-10-CM

## 2012-07-05 DIAGNOSIS — Z950 Presence of cardiac pacemaker: Secondary | ICD-10-CM

## 2012-07-05 DIAGNOSIS — R609 Edema, unspecified: Secondary | ICD-10-CM

## 2012-07-05 DIAGNOSIS — I5033 Acute on chronic diastolic (congestive) heart failure: Secondary | ICD-10-CM

## 2012-07-05 DIAGNOSIS — I495 Sick sinus syndrome: Secondary | ICD-10-CM

## 2012-07-05 DIAGNOSIS — I4891 Unspecified atrial fibrillation: Secondary | ICD-10-CM

## 2012-07-05 NOTE — Patient Instructions (Addendum)
The current medical regimen is effective;  continue present plan and medications.  Follow up in 1 month with Dr Antoine Poche.

## 2012-07-05 NOTE — Progress Notes (Signed)
HPI The patient presents for  followup after another hospitalization.  She was admitted on December 28. She was tachycardic and hypertensive with oxygen sats in the 70s. She was found to have paroxysmal atrial fibrillation. During this admission she was started on sotalol. She had been intolerant of amiodarone and Tikosyn in the past.  Since going home she's been watching her weight using salt substitute. She gets of weights that are transmitted automatically to her insurance company.   She has not felt her heart going out of rhythm. She's had no palpitations, presyncope or syncope. She's had no chest pressure, neck or arm discomfort. She seems to be tolerating the meds as listed.   Allergies  Allergen Reactions  . Ace Inhibitors Cough    Enalapril  benicor    . Crestor (Rosuvastatin Calcium) Other (See Comments)    Fatigue and leg pain   . Lipitor (Atorvastatin Calcium)     Myalgias   . Niaspan (Niacin) Other (See Comments)    unknown  . Olmesartan Cough  . Prednisone     REACTION: "retained fluid"    Current Outpatient Prescriptions  Medication Sig Dispense Refill  . amLODipine (NORVASC) 10 MG tablet Take 1 tablet (10 mg total) by mouth daily.  30 tablet  10  . cephALEXin (KEFLEX) 500 MG capsule Take 1 capsule (500 mg total) by mouth 4 (four) times daily.  28 capsule  0  . clopidogrel (PLAVIX) 75 MG tablet Take 75 mg by mouth daily.      . colesevelam (WELCHOL) 625 MG tablet Take 625 mg by mouth at bedtime.       . Cyanocobalamin (VITAMIN B-12 IJ) Inject 1 application as directed every 30 (thirty) days. Given at the doctor's office at the end of each month      . Fe Fum-FA-B Cmp-C-Zn-Mg-Mn-Cu (HEMOCYTE PLUS PO) Take 1 tablet by mouth daily.      . fish oil-omega-3 fatty acids 1000 MG capsule Take 1 g by mouth at bedtime.       . furosemide (LASIX) 80 MG tablet Take 1 tablet (80 mg total) by mouth 2 (two) times daily.  60 tablet  10  . insulin aspart (NOVOLOG FLEXPEN) 100 UNIT/ML  injection Inject 10 Units into the skin 3 (three) times daily before meals.      . insulin detemir (LEVEMIR) 100 UNIT/ML injection Inject 26 Units into the skin every morning.       . isosorbide mononitrate (IMDUR) 60 MG 24 hr tablet Take 60 mg by mouth every morning.       . latanoprost (XALATAN) 0.005 % ophthalmic solution Place 1 drop into both eyes at bedtime.       . magnesium oxide (MAG-OX) 400 MG tablet Take 400 mg by mouth 3 (three) times daily.      . metFORMIN (GLUCOPHAGE) 1000 MG tablet Take 1,000 mg by mouth 2 (two) times daily with a meal.      . metoprolol (LOPRESSOR) 50 MG tablet Take 50 mg by mouth 2 (two) times daily.      . nitroGLYCERIN (NITROSTAT) 0.4 MG SL tablet Place 0.4 mg under the tongue every 5 (five) minutes as needed. As needed for chest pain      . potassium chloride SA (K-DUR,KLOR-CON) 20 MEQ tablet Take 20 mEq by mouth 2 (two) times daily.       . sotalol (BETAPACE) 80 MG tablet Take 1 tablet (80 mg total) by mouth 2 (two) times daily.  60  tablet  11  . warfarin (COUMADIN) 5 MG tablet Take 5-10 mg by mouth daily. Takes 5mg  (1 tablet) daily except  on Monday Wednesday and Friday and takes 10 mg      . [DISCONTINUED] diltiazem (TIAZAC) 420 MG 24 hr capsule Take 420 mg by mouth daily.        Past Medical History  Diagnosis Date  . Hypertension     with h/o hypertensive urgency  . Glaucoma(365)   . Tremor, essential   . Diabetes mellitus with neuropathy   . B12 deficiency   . Hyperlipidemia   . Atrial fibrillation     a. on chronic anticoagulation with warfarin  b. Tiksosyn discontinued and amiodarone  was discontinued secondary to perceived symptoms  . Chronic diastolic heart failure     a. echo 05/2009: mild LVH, EF 55-60%, grade 2 diast dysfxn, mild LAE, PASP;   b. Echo 3/13:   mod LVH, EF 55-60%, mild to mod calc AV annulus, very mild AS (mean 9 mmHg)  . CAD (coronary artery disease)     a.  b. 07/2011 NSTEMI - PCI/DES LCX - Resolute stent;;   b. LHC 12/27/11:  mLAD 30%, prox-mid D1 90% (small);  CFX patent stent, OM3 small 95%,, mRCA 30%, EF 65%  -  med Rx;   . Carotid stenosis     a. dopplers 6/12: 1-39% bilat ICA; b. carotid dopplers 7/13: bilat 1-39% ICA  . Pulmonary fibrosis     Dr. Vassie Loll;  patient taken off O2 in 8/12; dyspnea felt CHF>>ILD  . Skin cancer   . Colon polyps   . Syncope     unknown etiology  . Bradycardia   . Pacemaker 02/29/2012  . Diabetes mellitus     insulin controled  . Elevated TSH 05/2012  . Normocytic anemia 05/2012  . CHF (congestive heart failure)     Past Surgical History  Procedure Date  . Appendectomy   . Nose surgery   . Colonoscopy   . Cardiac catheterization 2013    stent  . Coronary angioplasty with stent placement   . Insert / replace / remove pacemaker 02/29/2012  . Dilation and curettage of uterus    ROS:  As stated in the HPI and negative for all other systems.  PHYSICAL EXAM BP 132/64  Pulse 84  Ht 5\' 3"  (1.6 m)  Wt 179 lb (81.194 kg)  BMI 31.71 kg/m2  SpO2 93% GENERAL:  Well appearing HEENT:  Pupils equal round and reactive, fundi not visualized, oral mucosa unremarkable NECK:  No jugular venous distention, waveform within normal limits, carotid upstroke brisk and symmetric, left bruit, no thyromegaly LYMPHATICS:  No cervical, inguinal adenopathy LUNGS:  Clear to auscultation bilaterally BACK:  No CVA tenderness CHEST:  Well healed pacemaker pocket HEART:  PMI not displaced or sustained,S1 and S2 within normal limits, no S3, no S4, no clicks, no rubs, early peaking apical systolic murmur, no diastolic murmurs murmurs ABD:  Flat, positive bowel sounds normal in frequency in pitch, mid bruits, no rebound, no guarding, no midline pulsatile mass, no hepatomegaly, no splenomegaly EXT:  2 plus pulses throughout, mild ankle edema, no cyanosis no clubbing SKIN:  No rashes no nodules NEURO:  Cranial nerves II through XII grossly intact, motor grossly intact throughout, resting tremor PSYCH:   Cognitively intact, oriented to person place and time  EKG:  Rhythm strip QTC 420  07/05/2012   ASSESSMENT AND PLAN   FIBRILLATION, ATRIAL -  She seems to be  maintaining sinus rhythm on the Sotalol.  Of course if she has any acute decompensation is she's had previously she needs to present urgently to the emergency room. Next step would likely be AV node ablation.  HYPERTENSION -  Her blood pressure is controlled today. She gets this monitored at home and says it's well controlled. No change in therapy is indicated.  DIASTOLIC DYSFUNCTION -  She seems to be euvolemic on current meds. She did have her labs checked the other day and her BUN and creatinine were stable. Her TSH was elevated slightly as it was previously. This is being followed by Dr. Christell Constant. No change in therapy is indicated.  CAD -  She had nonobstructive disease and small vessel disease at the last cath in May.  She has had no new symptoms since her circumflex PCI early last year.Marland Kitchen She will continue with risk reduction.  Transition of care appointment 7 days the patient was called within 48 hours of discharge.

## 2012-07-06 ENCOUNTER — Encounter (INDEPENDENT_AMBULATORY_CARE_PROVIDER_SITE_OTHER): Payer: Medicare Other | Admitting: Ophthalmology

## 2012-07-06 DIAGNOSIS — H43819 Vitreous degeneration, unspecified eye: Secondary | ICD-10-CM

## 2012-07-06 DIAGNOSIS — I10 Essential (primary) hypertension: Secondary | ICD-10-CM

## 2012-07-06 DIAGNOSIS — H35039 Hypertensive retinopathy, unspecified eye: Secondary | ICD-10-CM

## 2012-07-06 DIAGNOSIS — E1165 Type 2 diabetes mellitus with hyperglycemia: Secondary | ICD-10-CM

## 2012-07-06 DIAGNOSIS — E1139 Type 2 diabetes mellitus with other diabetic ophthalmic complication: Secondary | ICD-10-CM

## 2012-07-06 DIAGNOSIS — E11359 Type 2 diabetes mellitus with proliferative diabetic retinopathy without macular edema: Secondary | ICD-10-CM

## 2012-07-06 DIAGNOSIS — E11311 Type 2 diabetes mellitus with unspecified diabetic retinopathy with macular edema: Secondary | ICD-10-CM

## 2012-07-25 ENCOUNTER — Other Ambulatory Visit: Payer: Self-pay | Admitting: Cardiology

## 2012-08-02 ENCOUNTER — Encounter: Payer: Self-pay | Admitting: Cardiology

## 2012-08-02 ENCOUNTER — Ambulatory Visit (INDEPENDENT_AMBULATORY_CARE_PROVIDER_SITE_OTHER): Payer: Medicare Other | Admitting: Cardiology

## 2012-08-02 VITALS — BP 160/70 | HR 88 | Ht 63.0 in | Wt 184.0 lb

## 2012-08-02 DIAGNOSIS — I10 Essential (primary) hypertension: Secondary | ICD-10-CM

## 2012-08-02 DIAGNOSIS — I4891 Unspecified atrial fibrillation: Secondary | ICD-10-CM

## 2012-08-02 DIAGNOSIS — I495 Sick sinus syndrome: Secondary | ICD-10-CM

## 2012-08-02 DIAGNOSIS — I5033 Acute on chronic diastolic (congestive) heart failure: Secondary | ICD-10-CM

## 2012-08-02 DIAGNOSIS — I2510 Atherosclerotic heart disease of native coronary artery without angina pectoris: Secondary | ICD-10-CM

## 2012-08-02 NOTE — Patient Instructions (Addendum)
The current medical regimen is effective;  continue present plan and medications.  NO SALT!!!!!  Follow up in 2 months with Dr Antoine Poche.

## 2012-08-02 NOTE — Progress Notes (Signed)
HPI The patient presents for  followup after multiple episodes of heart failure.  She's not had any acute shortness of breath. She's not describing PND or orthopnea. She's had no palpitations, presyncope or syncope. She denies chest pain. I do note that her weight is up. Unfortunately she does not weigh herself daily. She says she's cut way back on salt but I don't think she is reducing it as much as I would like. I think she's having a hard time with fluid restriction.   Allergies  Allergen Reactions  . Ace Inhibitors Cough    Enalapril  benicor    . Crestor (Rosuvastatin Calcium) Other (See Comments)    Fatigue and leg pain   . Lipitor (Atorvastatin Calcium)     Myalgias   . Niaspan (Niacin) Other (See Comments)    unknown  . Olmesartan Cough  . Prednisone     REACTION: "retained fluid"    Current Outpatient Prescriptions  Medication Sig Dispense Refill  . amLODipine (NORVASC) 10 MG tablet Take 1 tablet (10 mg total) by mouth daily.  30 tablet  10  . clopidogrel (PLAVIX) 75 MG tablet Take 75 mg by mouth daily.      . colesevelam (WELCHOL) 625 MG tablet Take 625 mg by mouth at bedtime.       . Cyanocobalamin (VITAMIN B-12 IJ) Inject 1 application as directed every 30 (thirty) days. Given at the doctor's office at the end of each month      . Fe Fum-FA-B Cmp-C-Zn-Mg-Mn-Cu (HEMOCYTE PLUS PO) Take 1 tablet by mouth daily.      . fish oil-omega-3 fatty acids 1000 MG capsule Take 1 g by mouth at bedtime.       . furosemide (LASIX) 80 MG tablet Take 1 tablet (80 mg total) by mouth 2 (two) times daily.  60 tablet  10  . insulin aspart (NOVOLOG FLEXPEN) 100 UNIT/ML injection Inject 10 Units into the skin 3 (three) times daily before meals.      . insulin detemir (LEVEMIR) 100 UNIT/ML injection Inject 26 Units into the skin every morning.       . isosorbide mononitrate (IMDUR) 60 MG 24 hr tablet TAKE ONE TABLET BY MOUTH ONE TIME DAILY  30 tablet  5  . latanoprost (XALATAN) 0.005 %  ophthalmic solution Place 1 drop into both eyes at bedtime.       Marland Kitchen levothyroxine (SYNTHROID, LEVOTHROID) 50 MCG tablet       . magnesium oxide (MAG-OX) 400 MG tablet Take 400 mg by mouth 3 (three) times daily.      . metFORMIN (GLUCOPHAGE) 1000 MG tablet Take 1,000 mg by mouth 2 (two) times daily with a meal.      . metoprolol (LOPRESSOR) 50 MG tablet Take 50 mg by mouth 2 (two) times daily.      . nitroGLYCERIN (NITROSTAT) 0.4 MG SL tablet Place 0.4 mg under the tongue every 5 (five) minutes as needed. As needed for chest pain      . potassium chloride SA (K-DUR,KLOR-CON) 20 MEQ tablet Take 20 mEq by mouth 2 (two) times daily.       . sotalol (BETAPACE) 80 MG tablet Take 1 tablet (80 mg total) by mouth 2 (two) times daily.  60 tablet  11  . warfarin (COUMADIN) 5 MG tablet Take 5-10 mg by mouth daily. Takes 5mg  (1 tablet) daily except  on Monday Wednesday and Friday and takes 10 mg      . [DISCONTINUED]  diltiazem (TIAZAC) 420 MG 24 hr capsule Take 420 mg by mouth daily.        Past Medical History  Diagnosis Date  . Hypertension     with h/o hypertensive urgency  . Glaucoma(365)   . Tremor, essential   . Diabetes mellitus with neuropathy   . B12 deficiency   . Hyperlipidemia   . Atrial fibrillation     a. on chronic anticoagulation with warfarin  b. Tiksosyn discontinued and amiodarone  was discontinued secondary to perceived symptoms  . Chronic diastolic heart failure     a. echo 05/2009: mild LVH, EF 55-60%, grade 2 diast dysfxn, mild LAE, PASP;   b. Echo 3/13:   mod LVH, EF 55-60%, mild to mod calc AV annulus, very mild AS (mean 9 mmHg)  . CAD (coronary artery disease)     a.  b. 07/2011 NSTEMI - PCI/DES LCX - Resolute stent;;   b. LHC 12/27/11: mLAD 30%, prox-mid D1 90% (small);  CFX patent stent, OM3 small 95%,, mRCA 30%, EF 65%  -  med Rx;   . Carotid stenosis     a. dopplers 6/12: 1-39% bilat ICA; b. carotid dopplers 7/13: bilat 1-39% ICA  . Pulmonary fibrosis     Dr. Vassie Loll;   patient taken off O2 in 8/12; dyspnea felt CHF>>ILD  . Skin cancer   . Colon polyps   . Syncope     unknown etiology  . Bradycardia   . Pacemaker 02/29/2012  . Diabetes mellitus     insulin controled  . Elevated TSH 05/2012  . Normocytic anemia 05/2012  . CHF (congestive heart failure)     Past Surgical History  Procedure Date  . Appendectomy   . Nose surgery   . Colonoscopy   . Cardiac catheterization 2013    stent  . Coronary angioplasty with stent placement   . Insert / replace / remove pacemaker 02/29/2012  . Dilation and curettage of uterus    ROS:  Hair loss.  Otherwise as stated in the HPI and negative for all other systems.  PHYSICAL EXAM BP 160/70  Pulse 88  Ht 5\' 3"  (1.6 m)  Wt 184 lb (83.462 kg)  BMI 32.59 kg/m2 GENERAL:  Well appearing NECK:  No jugular venous distention, waveform within normal limits, carotid upstroke brisk and symmetric, left bruit, no thyromegaly LYMPHATICS:  No cervical, inguinal adenopathy LUNGS:  Clear to auscultation bilaterally CHEST:  Well healed pacemaker pocket HEART:  PMI not displaced or sustained,S1 and S2 within normal limits, no S3, no S4, no clicks, no rubs, early peaking apical systolic murmur, no diastolic murmurs murmurs ABD:  Flat, positive bowel sounds normal in frequency in pitch, mid bruits, no rebound, no guarding, no midline pulsatile mass, no hepatomegaly, no splenomegaly EXT:  2 plus pulses throughout, mild ankle edema, no cyanosis no clubbing NEURO:  Cranial nerves II through XII grossly intact, motor grossly intact throughout, resting tremor    ASSESSMENT AND PLAN  FIBRILLATION, ATRIAL -  She seems to be maintaining sinus rhythm on the Sotalol.  If she has any further episodes likely the next step would likely be AV node ablation.  HYPERTENSION -  Her blood pressure is controlled today. No change in therapy is indicated.  DIASTOLIC DYSFUNCTION -  She seems to be euvolemic on current meds. Diarrhea and of  course the need for salt and fluid restriction to try to avoid her acute exacerbations.  CAD -  She had nonobstructive disease and small vessel  disease at the last cath in May.  She has had no new symptoms since her circumflex PCI early last year.Marland Kitchen She will continue with risk reduction.

## 2012-08-12 ENCOUNTER — Observation Stay (HOSPITAL_COMMUNITY)
Admission: EM | Admit: 2012-08-12 | Discharge: 2012-08-14 | Disposition: A | Discharge status: Home / Self Care | Payer: Medicare Other | Attending: Internal Medicine | Admitting: Internal Medicine

## 2012-08-12 ENCOUNTER — Encounter (HOSPITAL_COMMUNITY): Payer: Self-pay | Admitting: Cardiology

## 2012-08-12 ENCOUNTER — Emergency Department (HOSPITAL_COMMUNITY): Payer: Medicare Other

## 2012-08-12 DIAGNOSIS — R531 Weakness: Secondary | ICD-10-CM

## 2012-08-12 DIAGNOSIS — I10 Essential (primary) hypertension: Secondary | ICD-10-CM

## 2012-08-12 DIAGNOSIS — Z7901 Long term (current) use of anticoagulants: Secondary | ICD-10-CM | POA: Insufficient documentation

## 2012-08-12 DIAGNOSIS — Z9861 Coronary angioplasty status: Secondary | ICD-10-CM | POA: Insufficient documentation

## 2012-08-12 DIAGNOSIS — D72829 Elevated white blood cell count, unspecified: Secondary | ICD-10-CM

## 2012-08-12 DIAGNOSIS — R609 Edema, unspecified: Secondary | ICD-10-CM

## 2012-08-12 DIAGNOSIS — E785 Hyperlipidemia, unspecified: Secondary | ICD-10-CM

## 2012-08-12 DIAGNOSIS — Z794 Long term (current) use of insulin: Secondary | ICD-10-CM | POA: Insufficient documentation

## 2012-08-12 DIAGNOSIS — E119 Type 2 diabetes mellitus without complications: Secondary | ICD-10-CM

## 2012-08-12 DIAGNOSIS — I252 Old myocardial infarction: Secondary | ICD-10-CM | POA: Insufficient documentation

## 2012-08-12 DIAGNOSIS — I5043 Acute on chronic combined systolic (congestive) and diastolic (congestive) heart failure: Secondary | ICD-10-CM

## 2012-08-12 DIAGNOSIS — Z950 Presence of cardiac pacemaker: Secondary | ICD-10-CM

## 2012-08-12 DIAGNOSIS — Z79899 Other long term (current) drug therapy: Secondary | ICD-10-CM | POA: Insufficient documentation

## 2012-08-12 DIAGNOSIS — I6529 Occlusion and stenosis of unspecified carotid artery: Secondary | ICD-10-CM

## 2012-08-12 DIAGNOSIS — R42 Dizziness and giddiness: Principal | ICD-10-CM

## 2012-08-12 DIAGNOSIS — I5032 Chronic diastolic (congestive) heart failure: Secondary | ICD-10-CM

## 2012-08-12 DIAGNOSIS — N390 Urinary tract infection, site not specified: Secondary | ICD-10-CM

## 2012-08-12 DIAGNOSIS — I5033 Acute on chronic diastolic (congestive) heart failure: Secondary | ICD-10-CM

## 2012-08-12 DIAGNOSIS — I491 Atrial premature depolarization: Secondary | ICD-10-CM

## 2012-08-12 DIAGNOSIS — E1142 Type 2 diabetes mellitus with diabetic polyneuropathy: Secondary | ICD-10-CM | POA: Insufficient documentation

## 2012-08-12 DIAGNOSIS — Z860100 Personal history of colon polyps, unspecified: Secondary | ICD-10-CM

## 2012-08-12 DIAGNOSIS — I495 Sick sinus syndrome: Secondary | ICD-10-CM

## 2012-08-12 DIAGNOSIS — I509 Heart failure, unspecified: Secondary | ICD-10-CM | POA: Insufficient documentation

## 2012-08-12 DIAGNOSIS — I4891 Unspecified atrial fibrillation: Secondary | ICD-10-CM

## 2012-08-12 DIAGNOSIS — R262 Difficulty in walking, not elsewhere classified: Secondary | ICD-10-CM | POA: Insufficient documentation

## 2012-08-12 DIAGNOSIS — I658 Occlusion and stenosis of other precerebral arteries: Secondary | ICD-10-CM | POA: Insufficient documentation

## 2012-08-12 DIAGNOSIS — E1149 Type 2 diabetes mellitus with other diabetic neurological complication: Secondary | ICD-10-CM | POA: Insufficient documentation

## 2012-08-12 DIAGNOSIS — I2510 Atherosclerotic heart disease of native coronary artery without angina pectoris: Secondary | ICD-10-CM

## 2012-08-12 DIAGNOSIS — E669 Obesity, unspecified: Secondary | ICD-10-CM

## 2012-08-12 DIAGNOSIS — R0602 Shortness of breath: Secondary | ICD-10-CM

## 2012-08-12 LAB — URINALYSIS, ROUTINE W REFLEX MICROSCOPIC
Bilirubin Urine: NEGATIVE
Glucose, UA: NEGATIVE mg/dL
Hgb urine dipstick: NEGATIVE
Specific Gravity, Urine: 1.018 (ref 1.005–1.030)
pH: 6 (ref 5.0–8.0)

## 2012-08-12 LAB — CBC WITH DIFFERENTIAL/PLATELET
Basophils Absolute: 0 10*3/uL (ref 0.0–0.1)
Eosinophils Absolute: 0.1 10*3/uL (ref 0.0–0.7)
Eosinophils Relative: 1 % (ref 0–5)
Lymphocytes Relative: 11 % — ABNORMAL LOW (ref 12–46)
Lymphs Abs: 1 10*3/uL (ref 0.7–4.0)
MCH: 25.8 pg — ABNORMAL LOW (ref 26.0–34.0)
MCV: 80.9 fL (ref 78.0–100.0)
Neutrophils Relative %: 82 % — ABNORMAL HIGH (ref 43–77)
Platelets: 190 10*3/uL (ref 150–400)
RBC: 4.81 MIL/uL (ref 3.87–5.11)
RDW: 16.1 % — ABNORMAL HIGH (ref 11.5–15.5)
WBC: 8.8 10*3/uL (ref 4.0–10.5)

## 2012-08-12 LAB — COMPREHENSIVE METABOLIC PANEL
ALT: 12 U/L (ref 0–35)
AST: 16 U/L (ref 0–37)
Alkaline Phosphatase: 83 U/L (ref 39–117)
Calcium: 10.3 mg/dL (ref 8.4–10.5)
Potassium: 4.1 mEq/L (ref 3.5–5.1)
Sodium: 134 mEq/L — ABNORMAL LOW (ref 135–145)
Total Protein: 8 g/dL (ref 6.0–8.3)

## 2012-08-12 LAB — PRO B NATRIURETIC PEPTIDE: Pro B Natriuretic peptide (BNP): 301.2 pg/mL (ref 0–450)

## 2012-08-12 LAB — APTT: aPTT: 36 seconds (ref 24–37)

## 2012-08-12 LAB — PROTIME-INR
INR: 1.47 (ref 0.00–1.49)
Prothrombin Time: 17.4 seconds — ABNORMAL HIGH (ref 11.6–15.2)

## 2012-08-12 LAB — LIPASE, BLOOD: Lipase: 32 U/L (ref 11–59)

## 2012-08-12 LAB — TROPONIN I: Troponin I: <0.3 ng/mL (ref <0.30)

## 2012-08-12 MED ORDER — LEVOTHYROXINE SODIUM 50 MCG PO TABS
50.0000 ug | ORAL_TABLET | Freq: Every day | ORAL | Status: DC
  Administered 2012-08-13 – 2012-08-14 (×2): 50 ug via ORAL
  Filled 2012-08-12 (×3): qty 1

## 2012-08-12 MED ORDER — NITROGLYCERIN 0.4 MG SL SUBL
0.4000 mg | SUBLINGUAL_TABLET | SUBLINGUAL | Status: DC | PRN
  Administered 2012-08-12 (×2): 0.4 mg via SUBLINGUAL
  Filled 2012-08-12: qty 25

## 2012-08-12 MED ORDER — MECLIZINE HCL 12.5 MG PO TABS
12.5000 mg | ORAL_TABLET | Freq: Two times a day (BID) | ORAL | Status: DC | PRN
  Filled 2012-08-12: qty 1

## 2012-08-12 MED ORDER — CLOPIDOGREL BISULFATE 75 MG PO TABS
75.0000 mg | ORAL_TABLET | Freq: Every day | ORAL | Status: DC
  Administered 2012-08-12 – 2012-08-14 (×3): 75 mg via ORAL
  Filled 2012-08-12 (×3): qty 1

## 2012-08-12 MED ORDER — SOTALOL HCL 80 MG PO TABS
80.0000 mg | ORAL_TABLET | Freq: Two times a day (BID) | ORAL | Status: DC
  Administered 2012-08-12 – 2012-08-14 (×4): 80 mg via ORAL
  Filled 2012-08-12 (×5): qty 1

## 2012-08-12 MED ORDER — INSULIN DETEMIR 100 UNIT/ML ~~LOC~~ SOLN
26.0000 [IU] | Freq: Every morning | SUBCUTANEOUS | Status: DC
  Administered 2012-08-13: 26 [IU] via SUBCUTANEOUS
  Filled 2012-08-12: qty 10

## 2012-08-12 MED ORDER — OMEGA-3 FATTY ACIDS 1000 MG PO CAPS: 1.0000 g | ORAL_CAPSULE | Freq: Every day | ORAL | Status: DC

## 2012-08-12 MED ORDER — SODIUM CHLORIDE 0.9 % IJ SOLN
3.0000 mL | Freq: Two times a day (BID) | INTRAMUSCULAR | Status: DC
  Administered 2012-08-12 – 2012-08-14 (×4): 3 mL via INTRAVENOUS

## 2012-08-12 MED ORDER — ACETAMINOPHEN 650 MG RE SUPP: 650.0000 mg | Freq: Four times a day (QID) | RECTAL | Status: DC | PRN

## 2012-08-12 MED ORDER — INSULIN ASPART 100 UNIT/ML ~~LOC~~ SOLN
0.0000 [IU] | Freq: Three times a day (TID) | SUBCUTANEOUS | Status: DC
  Administered 2012-08-13: 7 [IU] via SUBCUTANEOUS

## 2012-08-12 MED ORDER — MAGNESIUM OXIDE 400 MG PO TABS
400.0000 mg | ORAL_TABLET | Freq: Three times a day (TID) | ORAL | Status: DC
  Administered 2012-08-12 – 2012-08-14 (×6): 400 mg via ORAL
  Filled 2012-08-12 (×8): qty 1

## 2012-08-12 MED ORDER — WARFARIN - PHARMACIST DOSING INPATIENT
Freq: Every day | Status: DC
  Administered 2012-08-12: 18:00:00

## 2012-08-12 MED ORDER — COLESEVELAM HCL 625 MG PO TABS
625.0000 mg | ORAL_TABLET | Freq: Every day | ORAL | Status: DC
  Administered 2012-08-12 – 2012-08-13 (×2): 625 mg via ORAL
  Filled 2012-08-12 (×3): qty 1

## 2012-08-12 MED ORDER — OMEGA-3-ACID ETHYL ESTERS 1 G PO CAPS
1.0000 g | ORAL_CAPSULE | Freq: Every day | ORAL | Status: DC
  Administered 2012-08-12 – 2012-08-13 (×2): 1 g via ORAL
  Filled 2012-08-12 (×3): qty 1

## 2012-08-12 MED ORDER — ALUM & MAG HYDROXIDE-SIMETH 200-200-20 MG/5ML PO SUSP: 30.0000 mL | Freq: Four times a day (QID) | ORAL | Status: DC | PRN

## 2012-08-12 MED ORDER — ONDANSETRON HCL 4 MG/2ML IJ SOLN: 4.0000 mg | Freq: Four times a day (QID) | INTRAMUSCULAR | Status: DC | PRN

## 2012-08-12 MED ORDER — ISOSORBIDE MONONITRATE ER 60 MG PO TB24
60.0000 mg | ORAL_TABLET | Freq: Every day | ORAL | Status: DC
  Administered 2012-08-12 – 2012-08-14 (×3): 60 mg via ORAL
  Filled 2012-08-12 (×3): qty 1

## 2012-08-12 MED ORDER — ALBUTEROL SULFATE (5 MG/ML) 0.5% IN NEBU: 2.5000 mg | INHALATION_SOLUTION | RESPIRATORY_TRACT | Status: DC | PRN

## 2012-08-12 MED ORDER — INSULIN ASPART 100 UNIT/ML ~~LOC~~ SOLN: 0.0000 [IU] | Freq: Three times a day (TID) | SUBCUTANEOUS | Status: DC

## 2012-08-12 MED ORDER — LATANOPROST 0.005 % OP SOLN
1.0000 [drp] | Freq: Every day | OPHTHALMIC | Status: DC
  Administered 2012-08-13: 1 [drp] via OPHTHALMIC
  Filled 2012-08-12: qty 2.5

## 2012-08-12 MED ORDER — FUROSEMIDE 80 MG PO TABS
80.0000 mg | ORAL_TABLET | Freq: Two times a day (BID) | ORAL | Status: DC
  Administered 2012-08-12 – 2012-08-14 (×4): 80 mg via ORAL
  Filled 2012-08-12 (×5): qty 1

## 2012-08-12 MED ORDER — METOPROLOL TARTRATE 50 MG PO TABS
50.0000 mg | ORAL_TABLET | Freq: Two times a day (BID) | ORAL | Status: DC
  Administered 2012-08-12 – 2012-08-14 (×4): 50 mg via ORAL
  Filled 2012-08-12 (×5): qty 1

## 2012-08-12 MED ORDER — POTASSIUM CHLORIDE CRYS ER 20 MEQ PO TBCR
20.0000 meq | EXTENDED_RELEASE_TABLET | Freq: Two times a day (BID) | ORAL | Status: DC
  Administered 2012-08-12 – 2012-08-14 (×4): 20 meq via ORAL
  Filled 2012-08-12 (×5): qty 1

## 2012-08-12 MED ORDER — INSULIN ASPART 100 UNIT/ML ~~LOC~~ SOLN
10.0000 [IU] | Freq: Three times a day (TID) | SUBCUTANEOUS | Status: DC
  Administered 2012-08-13: 10 [IU] via SUBCUTANEOUS

## 2012-08-12 MED ORDER — ONDANSETRON HCL 4 MG PO TABS: 4.0000 mg | ORAL_TABLET | Freq: Four times a day (QID) | ORAL | Status: DC | PRN

## 2012-08-12 MED ORDER — ACETAMINOPHEN 325 MG PO TABS
650.0000 mg | ORAL_TABLET | Freq: Four times a day (QID) | ORAL | Status: DC | PRN
  Administered 2012-08-13: 650 mg via ORAL
  Filled 2012-08-12: qty 2

## 2012-08-12 MED ORDER — AMLODIPINE BESYLATE 10 MG PO TABS
10.0000 mg | ORAL_TABLET | Freq: Every day | ORAL | Status: DC
  Administered 2012-08-12 – 2012-08-14 (×3): 10 mg via ORAL
  Filled 2012-08-12 (×3): qty 1

## 2012-08-12 MED ORDER — WARFARIN SODIUM 2.5 MG PO TABS
12.5000 mg | ORAL_TABLET | Freq: Once | ORAL | Status: AC
  Administered 2012-08-12: 12.5 mg via ORAL
  Filled 2012-08-12: qty 1

## 2012-08-12 NOTE — H&P (Signed)
PATIENT DETAILS Name: Alexandria [name] Age: 79 y.o. Sex: female Date of Birth: Jun 15, 1930 Admit Date: 08/12/2012 AVW:[id number], Dorinda Hill, MD   CHIEF COMPLAINT:  Dizziness  HPI: Patient is an 79 year old female with a past medical history of atrial fibrillation on chronic Coumadin therapy, diastolic heart failure, diabetes, hypertension who presented to the ED for evaluation of the above-noted complaints. The patient she was in her usual state of health, she woke up early this morning to go to the bathroom, when ambulating she noticed that she was very dizzy and was having difficulty walking. Since it was persistent, and is now associated with nausea she called her niece, who subsequently called EMS and the patient was brought to the hospital for further evaluation and treatment. A CT scan of the head was done in the emergency department, it was negative for acute abnormalities. Although she does feel slightly better than what she came in with, it was felt that since she lives alone and was still having difficulty with ambulation, it was felt that she needed to be brought into the hospital for further evaluation and treatment. She denies any headache, fever, chest pain, shortness of breath, history of vomiting or diarrhea. No history of abdominal pain.  ALLERGIES:   Allergies  Allergen Reactions  . Ace Inhibitors Cough    Enalapril  benicor    . Crestor (Rosuvastatin Calcium) Other (See Comments)    Fatigue and leg pain   . Lipitor (Atorvastatin Calcium)     Myalgias   . Niaspan (Niacin) Other (See Comments)    unknown  . Olmesartan Cough  . Prednisone     REACTION: "retained fluid"    PAST MEDICAL HISTORY: Past Medical History  Diagnosis Date  . Hypertension     with h/o hypertensive urgency  . Glaucoma(365)   . Tremor, essential   . Diabetes mellitus with neuropathy   . B12 deficiency   . Hyperlipidemia   . Atrial fibrillation     a. on chronic anticoagulation with warfarin  b.  Tiksosyn discontinued and amiodarone  was discontinued secondary to perceived symptoms  . Chronic diastolic heart failure     a. echo 05/2009: mild LVH, EF 55-60%, grade 2 diast dysfxn, mild LAE, PASP;   b. Echo 3/13:   mod LVH, EF 55-60%, mild to mod calc AV annulus, very mild AS (mean 9 mmHg)  . CAD (coronary artery disease)     a.  b. 07/2011 NSTEMI - PCI/DES LCX - Resolute stent;;   b. LHC 12/27/11: mLAD 30%, prox-mid D1 90% (small);  CFX patent stent, OM3 small 95%,, mRCA 30%, EF 65%  -  med Rx;   . Carotid stenosis     a. dopplers 6/12: 1-39% bilat ICA; b. carotid dopplers 7/13: bilat 1-39% ICA  . Pulmonary fibrosis     Dr. Vassie Loll;  patient taken off O2 in 8/12; dyspnea felt CHF>>ILD  . Skin cancer   . Colon polyps   . Syncope     unknown etiology  . Bradycardia   . Pacemaker 02/29/2012  . Diabetes mellitus     insulin controled  . Elevated TSH 05/2012  . Normocytic anemia 05/2012  . CHF (congestive heart failure)     PAST SURGICAL HISTORY: Past Surgical History  Procedure Laterality Date  . Appendectomy    . Nose surgery    . Colonoscopy    . Cardiac catheterization  2013    stent  . Coronary angioplasty with stent placement    .  Insert / replace / remove pacemaker  02/29/2012  . Dilation and curettage of uterus      MEDICATIONS AT HOME: Prior to Admission medications   Medication Sig Start Date End Date Taking? Authorizing Provider  amLODipine (NORVASC) 10 MG tablet Take 1 tablet (10 mg total) by mouth daily. 06/28/12  Yes Jodelle Gross, NP  clopidogrel (PLAVIX) 75 MG tablet Take 75 mg by mouth daily.   Yes Historical Provider, MD  colesevelam (WELCHOL) 625 MG tablet Take 625 mg by mouth at bedtime.    Yes Historical Provider, MD  Cyanocobalamin (VITAMIN B-12 IJ) Inject 1 application as directed every 30 (thirty) days. Given at the doctor's office at the end of each month   Yes Historical Provider, MD  Fe Fum-FA-B Cmp-C-Zn-Mg-Mn-Cu (HEMOCYTE PLUS PO) Take 1 tablet by  mouth daily.   Yes Historical Provider, MD  fish oil-omega-3 fatty acids 1000 MG capsule Take 1 g by mouth at bedtime.    Yes Historical Provider, MD  furosemide (LASIX) 80 MG tablet Take 1 tablet (80 mg total) by mouth 2 (two) times daily. 06/28/12  Yes Jodelle Gross, NP  insulin aspart (NOVOLOG FLEXPEN) 100 UNIT/ML injection Inject 10 Units into the skin 3 (three) times daily before meals.   Yes Historical Provider, MD  insulin detemir (LEVEMIR) 100 UNIT/ML injection Inject 26 Units into the skin every morning.    Yes Historical Provider, MD  isosorbide mononitrate (IMDUR) 60 MG 24 hr tablet Take 60 mg by mouth daily.   Yes Historical Provider, MD  latanoprost (XALATAN) 0.005 % ophthalmic solution Place 1 drop into both eyes at bedtime.    Yes Historical Provider, MD  levothyroxine (SYNTHROID, LEVOTHROID) 50 MCG tablet Take 50 mcg by mouth daily.  08/01/12  Yes Historical Provider, MD  magnesium oxide (MAG-OX) 400 MG tablet Take 400 mg by mouth 3 (three) times daily. 09/19/11 09/18/12 Yes Beatrice Lecher, PA  metFORMIN (GLUCOPHAGE) 1000 MG tablet Take 1,000 mg by mouth 2 (two) times daily with a meal. 12/28/11  Yes Jessica A Hope, PA-C  metoprolol (LOPRESSOR) 50 MG tablet Take 50 mg by mouth 2 (two) times daily.   Yes Historical Provider, MD  nitroGLYCERIN (NITROSTAT) 0.4 MG SL tablet Place 0.4 mg under the tongue every 5 (five) minutes as needed. As needed for chest pain   Yes Historical Provider, MD  potassium chloride SA (K-DUR,KLOR-CON) 20 MEQ tablet Take 20 mEq by mouth 2 (two) times daily.    Yes Historical Provider, MD  sotalol (BETAPACE) 80 MG tablet Take 1 tablet (80 mg total) by mouth 2 (two) times daily. 06/29/12  Yes [name], MD  warfarin (COUMADIN) 5 MG tablet Take 5-10 mg by mouth daily. Take 5 mg on Monday, Wednesday, Friday Take 10 mg on Sunday, Tuesday, Thursday, Saturday   Yes Historical Provider, MD    FAMILY HISTORY: Family History  Problem Relation Age of Onset  . Throat  cancer Brother   . Prostate cancer Father   . Diabetes Mother   . Diabetes Brother     multiple  . Heart disease Brother   . Colon cancer Neg Hx     SOCIAL HISTORY:  reports that she has never smoked. She has never used smokeless tobacco. She reports that she does not drink alcohol or use illicit drugs.  REVIEW OF SYSTEMS:  Constitutional:   No  weight loss, night sweats,  Fevers, chills, fatigue.  HEENT:    No headaches, Difficulty swallowing,Tooth/dental problems,Sore throat,  No sneezing, itching, ear ache, nasal congestion, post nasal drip,   Cardio-vascular: No chest pain,  Orthopnea, PND, swelling in lower extremities, anasarca,         dizziness, palpitations  GI:  No heartburn, indigestion, abdominal pain, nausea, vomiting, diarrhea, change in       bowel habits, loss of appetite  Resp: No shortness of breath with exertion or at rest.  No excess mucus, no productive cough, No non-productive cough,  No coughing up of blood.No change in color of mucus.No wheezing.No chest wall deformity  Skin:  no rash or lesions.  GU:  no dysuria, change in color of urine, no urgency or frequency.  No flank pain.  Musculoskeletal: No joint pain or swelling.  No decreased range of motion.  No back pain.  Psych: No change in mood or affect. No depression or anxiety.  No memory loss.   PHYSICAL EXAM: Blood pressure 148/52, pulse 62, temperature 98.1 F (36.7 C), temperature source Oral, resp. rate 19, SpO2 100.00%.  General appearance :Awake, alert, not in any distress. Speech Clear. Not toxic Looking HEENT: Atraumatic and Normocephalic, pupils equally reactive to light and accomodation Neck: supple, no JVD. No cervical lymphadenopathy.  Chest:Good air entry bilaterally, no added sounds  CVS: S1 S2 regular, no murmurs.  Abdomen: Bowel sounds present, Non tender and not distended with no gaurding, rigidity or rebound. Extremities: B/L Lower Ext shows no edema, both legs are warm  to touch Neurology: Awake alert, and oriented X 3, CN II-XII intact, Non focal Skin:No Rash Wounds:N/A  LABS ON ADMISSION:   Recent Labs  08/12/12 0948  NA 134*  K 4.1  CL 94*  CO2 31  GLUCOSE 319*  BUN 19  CREATININE 0.71  CALCIUM 10.3    Recent Labs  08/12/12 0948  AST 16  ALT 12  ALKPHOS 83  BILITOT 0.3  PROT 8.0  ALBUMIN 3.6    Recent Labs  08/12/12 0948  LIPASE 32    Recent Labs  08/12/12 0948  WBC 8.8  NEUTROABS 7.2  HGB 12.4  HCT 38.9  MCV 80.9  PLT 190    Recent Labs  08/12/12 0948  TROPONINI <0.30   No results found for this basename: DDIMER,  in the last 72 hours No components found with this basename: POCBNP,    RADIOLOGIC STUDIES ON ADMISSION: Ct Head Wo Contrast  08/12/2012  *RADIOLOGY REPORT*  Clinical Data: Dizziness.  CT HEAD WITHOUT CONTRAST  Technique:  Contiguous axial images were obtained from the base of the skull through the vertex without contrast.  Comparison: Head CT 12/24/2011.  Findings: There is mild cerebral and cerebellar atrophy.  Extensive patchy and confluent areas of decreased attenuation are noted throughout the deep and periventricular white matter of the cerebral hemispheres bilaterally, compatible with chronic microvascular ischemic disease.  Physiologic calcifications are noted in the basal ganglia bilaterally.  Old lacunar infarct in the inferior aspect of the right putamen.  No definite acute intracranial abnormalities.  Specifically, no definite signs of acute/subacute cerebral ischemia, no evidence of acute intracranial hemorrhage, no mass, mass effect, hydrocephalus or abnormal intra or extra-axial fluid collections.  Calcifications in the left cerebellar hemisphere are unchanged.  No acute displaced skull fractures are identified.  Visualized paranasal sinuses and mastoids are generally well pneumatized, of exception of some mucosal thickening throughout the sphenoid sinuses.  IMPRESSION: 1.  No acute intracranial  abnormalities. 2.  Mild cerebral and cerebellar atrophy with extensive chronic microvascular ischemic changes in the  deep and periventricular white matter of the cerebral hemispheres bilaterally and old lacunar infarction in the inferior aspect of the right putamen. 3.  Mucosal thickening in the sphenoid sinus.   Original Report Authenticated By: Trudie Reed, M.D.    Dg Chest Port 1 View  08/12/2012  *RADIOLOGY REPORT*  Clinical Data: Nausea, weakness.  PORTABLE CHEST - 1 VIEW  Comparison: 06/24/2012  Findings: Left pacer remains in place, unchanged.  Mild cardiomegaly.  Fullness of the hila bilaterally, right greater than left.  I suspect this is vascular, but cannot completely exclude adenopathy.  Mediastinal contours are otherwise within normal limits.  Calcified granuloma in the left midlung.  No confluent airspace opacities or edema.  No effusions or acute bony abnormality.  IMPRESSION: Cardiomegaly.  Mildly prominent hila bilaterally, likely vascular although adenopathy cannot be excluded.   Original Report Authenticated By: Charlett Nose, M.D.     ASSESSMENT AND PLAN: Present on Admission:  . Vertigo  - Suspect peripheral vertigo, CT of the head negative. Symptoms are slowly resolving. She does not have any focal neurological deficits on exam, speech is clear, doubt CVA at this point in time. However she does have a history of atrial fibrillation and is subtherapeutic on her INR.  - For now, will get vestibular PT, provided as needed meclizine and continue to monitor her. If her symptoms are still persistent or if they get worse, she may need a repeat CT of the head, given the fact that she has a pacemaker in place we are unable to do an MRI   . Atrial fibrillation - Paced rhythm  - On sotalol and metoprolol  - Continue with Coumadin-will have pharmacy dose while inpatient   . CAD - Continue Plavix   . DIABETES MELLITUS - Continue with home insulin regimen  - At [facility]  .  HYPERTENSION - Currently controlled-continue with amlodipine, metoprolol, Lasix   . Tachycardia-bradycardia syndrome - Paced rhythm  - Monitor in telemetry   . Chronic diastolic CHF (congestive heart failure) - Clinically compensated, monitor daily weights   Further plan will depend as patient's clinical course evolves and further radiologic and laboratory data become available. Patient will be monitored closely.  DVT Prophylaxis: - Not needed as on Coumadin  Code Status: - Full code  Total time spent for admission equals 45 minutes.  [facility] Pager [phone]  If 7PM-7AM, please contact night-coverage [email or web address] Password [facility] 08/12/2012, 4:56 PM

## 2012-08-12 NOTE — ED Provider Notes (Signed)
History  This chart was scribed for [name], MD by Bennett Scrape, ED Scribe. This patient was seen in room A06C/A06C and the patient's care was started at 8:56 AM.  CSN: [id number]  Arrival date & time 08/12/12  0848   First MD Initiated Contact with Patient 08/12/12 [phone]      Chief Complaint  Patient presents with  . Nausea     The history is provided by the patient. No language interpreter was used.   Alexandria [name] is a 79 y.o. female brought in by ambulance, who presents to the Emergency Department complaining of 4.5 hours of sudden onset, gradually improving, intermittent dizziness described as the room spinning with associated nausea that she noticed while attempting to get to the bathroom. She states that the dizzy was worse with changing positions and standing. She reports that she was given meclizine en route and denies having dizziness currently. She reports similar symptoms 5 years ago diagnosed as "inner ear problems". She denies hearing changes, fevers, chills, CP, SOB, cough, abdominal pain and emesis as associated symptoms. She is currently on Lasix and denies any recent urinary changes. She wears 2L of O2 at home. She has a h/o pacemaker, HTN, HLD and A. Fib and she denies smoking and alcohol use.  She is with [facility]/ Dr. Kirtland Bouchard   Past Medical History  Diagnosis Date  . Hypertension     with h/o hypertensive urgency  . Glaucoma(365)   . Tremor, essential   . Diabetes mellitus with neuropathy   . B12 deficiency   . Hyperlipidemia   . Atrial fibrillation     a. on chronic anticoagulation with warfarin  b. Tiksosyn discontinued and amiodarone  was discontinued secondary to perceived symptoms  . Chronic diastolic heart failure     a. echo 05/2009: mild LVH, EF 55-60%, grade 2 diast dysfxn, mild LAE, PASP;   b. Echo 3/13:   mod LVH, EF 55-60%, mild to mod calc AV annulus, very mild AS (mean 9 mmHg)  . CAD (coronary artery disease)     a.  b.  07/2011 NSTEMI - PCI/DES LCX - Resolute stent;;   b. LHC 12/27/11: mLAD 30%, prox-mid D1 90% (small);  CFX patent stent, OM3 small 95%,, mRCA 30%, EF 65%  -  med Rx;   . Carotid stenosis     a. dopplers 6/12: 1-39% bilat ICA; b. carotid dopplers 7/13: bilat 1-39% ICA  . Pulmonary fibrosis     Dr. Vassie Loll;  patient taken off O2 in 8/12; dyspnea felt CHF>>ILD  . Skin cancer   . Colon polyps   . Syncope     unknown etiology  . Bradycardia   . Pacemaker 02/29/2012  . Diabetes mellitus     insulin controled  . Elevated TSH 05/2012  . Normocytic anemia 05/2012  . CHF (congestive heart failure)     Past Surgical History  Procedure Laterality Date  . Appendectomy    . Nose surgery    . Colonoscopy    . Cardiac catheterization  2013    stent  . Coronary angioplasty with stent placement    . Insert / replace / remove pacemaker  02/29/2012  . Dilation and curettage of uterus      Family History  Problem Relation Age of Onset  . Throat cancer Brother   . Prostate cancer Father   . Diabetes Mother   . Diabetes Brother     multiple  . Heart disease Brother   .  Colon cancer Neg Hx     History  Substance Use Topics  . Smoking status: Never Smoker   . Smokeless tobacco: Never Used  . Alcohol Use: No   No OB history provided.   Review of Systems  Constitutional: Negative for fever and chills.  HENT: Negative for hearing loss.   Respiratory: Negative for cough and shortness of breath.   Cardiovascular: Negative for chest pain.  Gastrointestinal: Positive for nausea. Negative for vomiting and abdominal pain.  Neurological: Positive for dizziness. Negative for speech difficulty.  All other systems reviewed and are negative.    Allergies  Ace inhibitors; Crestor; Lipitor; Niaspan; Olmesartan; and Prednisone  Home Medications   Current Outpatient Rx  Name  Route  Sig  Dispense  Refill  . amLODipine (NORVASC) 10 MG tablet   Oral   Take 1 tablet (10 mg total) by mouth daily.    30 tablet   10   . clopidogrel (PLAVIX) 75 MG tablet   Oral   Take 75 mg by mouth daily.         . colesevelam (WELCHOL) 625 MG tablet   Oral   Take 625 mg by mouth at bedtime.          . Cyanocobalamin (VITAMIN B-12 IJ)   Injection   Inject 1 application as directed every 30 (thirty) days. Given at the doctor's office at the end of each month         . Fe Fum-FA-B Cmp-C-Zn-Mg-Mn-Cu (HEMOCYTE PLUS PO)   Oral   Take 1 tablet by mouth daily.         . fish oil-omega-3 fatty acids 1000 MG capsule   Oral   Take 1 g by mouth at bedtime.          . furosemide (LASIX) 80 MG tablet   Oral   Take 1 tablet (80 mg total) by mouth 2 (two) times daily.   60 tablet   10   . insulin aspart (NOVOLOG FLEXPEN) 100 UNIT/ML injection   Subcutaneous   Inject 10 Units into the skin 3 (three) times daily before meals.         . insulin detemir (LEVEMIR) 100 UNIT/ML injection   Subcutaneous   Inject 26 Units into the skin every morning.          . isosorbide mononitrate (IMDUR) 60 MG 24 hr tablet   Oral   Take 60 mg by mouth daily.         Marland Kitchen latanoprost (XALATAN) 0.005 % ophthalmic solution   Both Eyes   Place 1 drop into both eyes at bedtime.          Marland Kitchen levothyroxine (SYNTHROID, LEVOTHROID) 50 MCG tablet   Oral   Take 50 mcg by mouth daily.          . magnesium oxide (MAG-OX) 400 MG tablet   Oral   Take 400 mg by mouth 3 (three) times daily.         . metFORMIN (GLUCOPHAGE) 1000 MG tablet   Oral   Take 1,000 mg by mouth 2 (two) times daily with a meal.         . metoprolol (LOPRESSOR) 50 MG tablet   Oral   Take 50 mg by mouth 2 (two) times daily.         . nitroGLYCERIN (NITROSTAT) 0.4 MG SL tablet   Sublingual   Place 0.4 mg under the tongue every 5 (five) minutes as needed. As needed  for chest pain         . potassium chloride SA (K-DUR,KLOR-CON) 20 MEQ tablet   Oral   Take 20 mEq by mouth 2 (two) times daily.          . sotalol (BETAPACE)  80 MG tablet   Oral   Take 1 tablet (80 mg total) by mouth 2 (two) times daily.   60 tablet   11   . warfarin (COUMADIN) 5 MG tablet   Oral   Take 5-10 mg by mouth daily. Take 5 mg on Monday, Wednesday, Friday Take 10 mg on Sunday, Tuesday, Thursday, Saturday           Triage Vitals: BP 152/67  Pulse 71  Temp(Src) 97.7 F (36.5 C) (Oral)  Resp 18  SpO2 99%  Physical Exam  Nursing note and vitals reviewed. Constitutional: She is oriented to person, place, and time. She appears well-developed and well-nourished. No distress.  HENT:  Head: Normocephalic and atraumatic.  Eyes: Conjunctivae and EOM are normal. Pupils are equal, round, and reactive to light.  Neck: Neck supple. No tracheal deviation present.  Cardiovascular: Normal rate and regular rhythm.   Pulmonary/Chest: Effort normal. No respiratory distress.  Bilateral basilar crackles  Abdominal: Soft. There is no tenderness.  Musculoskeletal: Normal range of motion.  Neurological: She is alert and oriented to person, place, and time. No cranial nerve deficit.  Negative head impact test, normal finger to nose, no nystagmus, baseline tremor, 5/5 motor and sensation intact in all extremities   Skin: Skin is warm and dry.  Psychiatric: She has a normal mood and affect. Her behavior is normal.    ED Course  Procedures (including critical care time)  DIAGNOSTIC STUDIES: Oxygen Saturation is 99% on 2L of O2 [place], normal by my interpretation.    COORDINATION OF CARE: 9:33 AM-Advised pt that her symptoms most likely is vertigo. Discussed treatment plan which includes Ct of head, CBC panel, UA with pt at bedside and pt agreed to plan. Outside of pt's room daughter expresses concern over dementia.   3:00 PM- Pt was walked around the ED and stated that she felt dizzy and needed assistance with ambulating. Discussed admission and pt agreed.  3:33 PM-Consult complete with Dr. Jerral Ralph, hospitalitis. Patient case explained and  discussed. Dr. Jerral Ralph agrees to admit patient for further evaluation and treatment to tele. Call ended at 3:34 PM.  Labs Reviewed  CBC WITH DIFFERENTIAL - Abnormal; Notable for the following:    MCH 25.8 (*)    RDW 16.1 (*)    Neutrophils Relative 82 (*)    Lymphocytes Relative 11 (*)    All other components within normal limits  COMPREHENSIVE METABOLIC PANEL - Abnormal; Notable for the following:    Sodium 134 (*)    Chloride 94 (*)    Glucose, Bld 319 (*)    GFR calc non Af Amer 79 (*)    All other components within normal limits  PROTIME-INR - Abnormal; Notable for the following:    Prothrombin Time 17.4 (*)    All other components within normal limits  LIPASE, BLOOD  URINALYSIS, ROUTINE W REFLEX MICROSCOPIC  PRO B NATRIURETIC PEPTIDE  TROPONIN I  APTT   Ct Head Wo Contrast  08/12/2012  *RADIOLOGY REPORT*  Clinical Data: Dizziness.  CT HEAD WITHOUT CONTRAST  Technique:  Contiguous axial images were obtained from the base of the skull through the vertex without contrast.  Comparison: Head CT 12/24/2011.  Findings: There is mild  cerebral and cerebellar atrophy.  Extensive patchy and confluent areas of decreased attenuation are noted throughout the deep and periventricular white matter of the cerebral hemispheres bilaterally, compatible with chronic microvascular ischemic disease.  Physiologic calcifications are noted in the basal ganglia bilaterally.  Old lacunar infarct in the inferior aspect of the right putamen.  No definite acute intracranial abnormalities.  Specifically, no definite signs of acute/subacute cerebral ischemia, no evidence of acute intracranial hemorrhage, no mass, mass effect, hydrocephalus or abnormal intra or extra-axial fluid collections.  Calcifications in the left cerebellar hemisphere are unchanged.  No acute displaced skull fractures are identified.  Visualized paranasal sinuses and mastoids are generally well pneumatized, of exception of some mucosal thickening  throughout the sphenoid sinuses.  IMPRESSION: 1.  No acute intracranial abnormalities. 2.  Mild cerebral and cerebellar atrophy with extensive chronic microvascular ischemic changes in the deep and periventricular white matter of the cerebral hemispheres bilaterally and old lacunar infarction in the inferior aspect of the right putamen. 3.  Mucosal thickening in the sphenoid sinus.   Original Report Authenticated By: Trudie Reed, M.D.    Dg Chest Port 1 View  08/12/2012  *RADIOLOGY REPORT*  Clinical Data: Nausea, weakness.  PORTABLE CHEST - 1 VIEW  Comparison: 06/24/2012  Findings: Left pacer remains in place, unchanged.  Mild cardiomegaly.  Fullness of the hila bilaterally, right greater than left.  I suspect this is vascular, but cannot completely exclude adenopathy.  Mediastinal contours are otherwise within normal limits.  Calcified granuloma in the left midlung.  No confluent airspace opacities or edema.  No effusions or acute bony abnormality.  IMPRESSION: Cardiomegaly.  Mildly prominent hila bilaterally, likely vascular although adenopathy cannot be excluded.   Original Report Authenticated By: Charlett Nose, M.D.      1. Vertigo      Date: 08/12/2012  Rate: 70  Rhythm: normal sinus rhythm  QRS Axis: normal  Intervals: normal  ST/T Wave abnormalities: normal  Conduction Disutrbances:first-degree A-V block  and nonspecific intraventricular conduction delay  Narrative Interpretation:   Old EKG Reviewed: none available    MDM  I personally performed the services described in this documentation, which was scribed in my presence. The recorded information has been reviewed and is accurate.     [name], MD 08/12/12 1556

## 2012-08-12 NOTE — ED Notes (Signed)
Portable xray at bedside.

## 2012-08-12 NOTE — Consult Note (Signed)
ANTICOAGULATION CONSULT NOTE - Initial Consult  Pharmacy Consult for Coumadin Indication: atrial fibrillation  Allergies  Allergen Reactions  . Ace Inhibitors Cough    Enalapril  benicor    . Crestor (Rosuvastatin Calcium) Other (See Comments)    Fatigue and leg pain   . Lipitor (Atorvastatin Calcium)     Myalgias   . Niaspan (Niacin) Other (See Comments)    unknown  . Olmesartan Cough  . Prednisone     REACTION: "retained fluid"   Vital Signs: Temp: 97.7 F (36.5 C) (02/15 1716) Temp src: Oral (02/15 1716) BP: 130/61 mmHg (02/15 1716) Pulse Rate: 69 (02/15 1716)  Labs:  Recent Labs  08/12/12 0948 08/12/12 1016  HGB 12.4  --   HCT 38.9  --   PLT 190  --   APTT  --  36  LABPROT  --  17.4*  INR  --  1.47  CREATININE 0.71  --   TROPONINI <0.30  --     The CrCl is unknown because both a height and weight (above a minimum accepted value) are required for this calculation.   Medical History: Past Medical History  Diagnosis Date  . Hypertension     with h/o hypertensive urgency  . Glaucoma(365)   . Tremor, essential   . Diabetes mellitus with neuropathy   . B12 deficiency   . Hyperlipidemia   . Atrial fibrillation     a. on chronic anticoagulation with warfarin  b. Tiksosyn discontinued and amiodarone  was discontinued secondary to perceived symptoms  . Chronic diastolic heart failure     a. echo 05/2009: mild LVH, EF 55-60%, grade 2 diast dysfxn, mild LAE, PASP;   b. Echo 3/13:   mod LVH, EF 55-60%, mild to mod calc AV annulus, very mild AS (mean 9 mmHg)  . CAD (coronary artery disease)     a.  b. 07/2011 NSTEMI - PCI/DES LCX - Resolute stent;;   b. LHC 12/27/11: mLAD 30%, prox-mid D1 90% (small);  CFX patent stent, OM3 small 95%,, mRCA 30%, EF 65%  -  med Rx;   . Carotid stenosis     a. dopplers 6/12: 1-39% bilat ICA; b. carotid dopplers 7/13: bilat 1-39% ICA  . Pulmonary fibrosis     Dr. Vassie Loll;  patient taken off O2 in 8/12; dyspnea felt CHF>>ILD  . Skin  cancer   . Colon polyps   . Syncope     unknown etiology  . Bradycardia   . Pacemaker 02/29/2012  . Diabetes mellitus     insulin controled  . Elevated TSH 05/2012  . Normocytic anemia 05/2012  . CHF (congestive heart failure)    Assessment: 82yof on coumadin pta for afib, admitted with a subtherapeutic INR 1.47. Coumadin to resume while inpatient. Home dose is 10mg  daily except 5mg  MWF. She has not taken a dose yet today. CBC wnl.  Goal of Therapy:  INR 2-3 Monitor platelets by anticoagulation protocol: Yes   Plan:  1) Coumadin 12.5mg  x 1 tonight 2) Daily INR  Fredrik Rigger 08/12/2012,5:53 PM

## 2012-08-12 NOTE — ED Notes (Signed)
Pt slow to ambulate in ER, states only slight dizziness no other issues pt was assisted with the use of a cain.

## 2012-08-12 NOTE — ED Notes (Signed)
Pt to department via EMS- pt reports weakness and dizziness that started this morning. Reports she was unable to get up to go the bathroom this morning she was so weak. CBg-306 Bp-159/60 Hr-104 pacemaker.

## 2012-08-13 DIAGNOSIS — R42 Dizziness and giddiness: Secondary | ICD-10-CM

## 2012-08-13 DIAGNOSIS — I4891 Unspecified atrial fibrillation: Secondary | ICD-10-CM

## 2012-08-13 DIAGNOSIS — I5032 Chronic diastolic (congestive) heart failure: Secondary | ICD-10-CM

## 2012-08-13 DIAGNOSIS — I509 Heart failure, unspecified: Secondary | ICD-10-CM

## 2012-08-13 LAB — CBC
MCH: 26.8 pg (ref 26.0–34.0)
MCHC: 32.9 g/dL (ref 30.0–36.0)
MCV: 81.5 fL (ref 78.0–100.0)
Platelets: 179 10*3/uL (ref 150–400)
RBC: 4.21 MIL/uL (ref 3.87–5.11)
RDW: 16.5 % — ABNORMAL HIGH (ref 11.5–15.5)

## 2012-08-13 LAB — BASIC METABOLIC PANEL
BUN: 21 mg/dL (ref 6–23)
GFR calc non Af Amer: 66 mL/min — ABNORMAL LOW (ref >90)
Glucose, Bld: 339 mg/dL — ABNORMAL HIGH (ref 70–99)
Potassium: 4.3 mEq/L (ref 3.5–5.1)

## 2012-08-13 LAB — GLUCOSE, CAPILLARY
Glucose-Capillary: 230 mg/dL — ABNORMAL HIGH (ref 70–99)
Glucose-Capillary: 178 mg/dL — ABNORMAL HIGH (ref 70–99)
Glucose-Capillary: 176 mg/dL — ABNORMAL HIGH (ref 70–99)

## 2012-08-13 MED ORDER — HEPARIN SODIUM (PORCINE) 5000 UNIT/ML IJ SOLN
5000.0000 [IU] | Freq: Three times a day (TID) | INTRAMUSCULAR | Status: DC
  Administered 2012-08-13 – 2012-08-14 (×3): 5000 [IU] via SUBCUTANEOUS
  Filled 2012-08-13 (×7): qty 1

## 2012-08-13 MED ORDER — INSULIN ASPART 100 UNIT/ML ~~LOC~~ SOLN: 10.0000 [IU] | Freq: Three times a day (TID) | SUBCUTANEOUS | Status: DC

## 2012-08-13 MED ORDER — INSULIN ASPART 100 UNIT/ML ~~LOC~~ SOLN
10.0000 [IU] | Freq: Three times a day (TID) | SUBCUTANEOUS | Status: DC
  Administered 2012-08-13 – 2012-08-14 (×3): 10 [IU] via SUBCUTANEOUS

## 2012-08-13 MED ORDER — INSULIN ASPART 100 UNIT/ML ~~LOC~~ SOLN
0.0000 [IU] | Freq: Three times a day (TID) | SUBCUTANEOUS | Status: DC
  Administered 2012-08-13: 2 [IU] via SUBCUTANEOUS
  Administered 2012-08-13: 3 [IU] via SUBCUTANEOUS
  Administered 2012-08-14: 7 [IU] via SUBCUTANEOUS

## 2012-08-13 MED ORDER — INSULIN ASPART 100 UNIT/ML ~~LOC~~ SOLN: 0.0000 [IU] | Freq: Three times a day (TID) | SUBCUTANEOUS | Status: DC

## 2012-08-13 MED ORDER — INSULIN DETEMIR 100 UNIT/ML ~~LOC~~ SOLN
30.0000 [IU] | Freq: Every morning | SUBCUTANEOUS | Status: DC
  Administered 2012-08-14: 30 [IU] via SUBCUTANEOUS
  Filled 2012-08-13: qty 10

## 2012-08-13 MED ORDER — WARFARIN SODIUM 10 MG PO TABS
10.0000 mg | ORAL_TABLET | Freq: Once | ORAL | Status: AC
  Administered 2012-08-13: 10 mg via ORAL
  Filled 2012-08-13: qty 1

## 2012-08-13 NOTE — Progress Notes (Signed)
Went to assess patient for c/o chest pain. Patient described it as an aching pain to mid chest/epigastic area. SL NTG given x 2, patient is currently pain free. Will continue to monitor. Troy Sine

## 2012-08-13 NOTE — Evaluation (Signed)
Physical Therapy Evaluation Patient Details Name: Alexandria [name] MRN: [id number] DOB: 14-Nov-1929 Today's Date: 08/13/2012 Time: 1191-4782 PT Time Calculation (min): 22 min  PT Assessment / Plan / Recommendation Clinical Impression  Patient is an 79 yo female admitted with vertigo.  Patient reports no dizziness today.  Patient with negative roll test.  No nystagmus noted   Normal smooth pursuits and saccades.  Normal VOR.  No symptoms of dizziness elicited today.  Patient with good mobility, and only slight decrease in balance with high level gait activities.  Will follow patient for further vestibular eval and balance therapy.    PT Assessment  Patient needs continued PT services    Follow Up Recommendations  No PT follow up;Supervision - Intermittent    Does the patient have the potential to tolerate intense rehabilitation      Barriers to Discharge Decreased caregiver support      Equipment Recommendations  None recommended by PT    Recommendations for Other Services     Frequency Min 3X/week    Precautions / Restrictions Precautions Precautions: Fall Restrictions Weight Bearing Restrictions: No   Pertinent Vitals/Pain       Mobility  Bed Mobility Bed Mobility: Rolling Right;Rolling Left;Right Sidelying to Sit;Sitting - Scoot to Edge of Bed;Sit to Supine Rolling Right: 7: Independent Rolling Left: 7: Independent Right Sidelying to Sit: 6: Modified independent (Device/Increase time);With rails Sitting - Scoot to Edge of Bed: 7: Independent Sit to Supine: 7: Independent Details for Bed Mobility Assistance: No cues or assist needed.  No dizziness with rolling. Transfers Transfers: Sit to Stand;Stand to Sit Sit to Stand: 5: Supervision;With upper extremity assist;From bed Stand to Sit: 5: Supervision;With upper extremity assist;To bed Details for Transfer Assistance: No cues needed.  Supervision for safety only.  No dizziness noted with  transfers. Ambulation/Gait Ambulation/Gait Assistance: 5: Supervision Ambulation Distance (Feet): 180 Feet Assistive device: None Ambulation/Gait Assistance Details: Patient with slight staggering noted x3 during gait - able to self-correct.  Able to maintain balance with vertical head turns during gait.  No dizziness noted with this activity. Gait Pattern: Step-through pattern;Decreased stride length Gait velocity: Slow    Exercises     PT Diagnosis: Abnormality of gait  PT Problem List: Decreased balance;Cardiopulmonary status limiting activity;Decreased activity tolerance PT Treatment Interventions: Gait training;Functional mobility training;Balance training;Patient/family education   PT Goals Acute Rehab PT Goals PT Goal Formulation: With patient Time For Goal Achievement: 08/20/12 Potential to Achieve Goals: Good Pt will Ambulate: >150 feet;Independently (with no loss of balance) PT Goal: Ambulate - Progress: Goal set today Additional Goals Additional Goal #1: Patient will score > 19/24 on DGI balance assessment to indicate low fall risk. PT Goal: Additional Goal #1 - Progress: Goal set today  Visit Information  Last PT Received On: 08/13/12 Assistance Needed: +1    Subjective Data  Subjective: No I'm not dizzy now. Patient Stated Goal: To go home tomorrow.   Prior Functioning  Home Living Lives With: Alone Available Help at Discharge: Family;Available PRN/intermittently Type of Home: House Home Access: Level entry Home Layout: Two level;Laundry or work area in basement;Able to live on main level with bedroom/bathroom Bathroom Shower/Tub: Health visitor: Handicapped height Bathroom Accessibility: Yes How Accessible: Accessible via walker Home Adaptive Equipment: Walker - rolling;Wheelchair - manual;Straight cane;Grab bars in shower;Grab bars around toilet Additional Comments: Patient walks around house on driveway to get to basement from outside if  needed. (Avoids stairs) Prior Function Level of Independence: Independent;Needs assistance Needs Assistance:  Light Housekeeping Light Housekeeping: Maximal Able to Take Stairs?: Yes Driving: Yes Vocation: Retired Musician: No difficulties    Copywriter, advertising Overall Cognitive Status: Appears within functional limits for tasks assessed/performed Arousal/Alertness: Awake/alert Orientation Level: Oriented X4 / Intact Behavior During Session: WFL for tasks performed    Extremity/Trunk Assessment Right Upper Extremity Assessment RUE ROM/Strength/Tone: WFL for tasks assessed RUE Sensation: WFL - Light Touch Left Upper Extremity Assessment LUE ROM/Strength/Tone: WFL for tasks assessed LUE Sensation: WFL - Light Touch Right Lower Extremity Assessment RLE ROM/Strength/Tone: WFL for tasks assessed RLE Sensation: History of peripheral neuropathy RLE Coordination: WFL - gross motor Left Lower Extremity Assessment LLE ROM/Strength/Tone: WFL for tasks assessed LLE Sensation: History of peripheral neuropathy LLE Coordination: WFL - gross motor   Balance    End of Session PT - End of Session Equipment Utilized During Treatment: Gait belt Activity Tolerance: Patient tolerated treatment well Patient left: in bed;with call bell/phone within reach Nurse Communication: Mobility status  GP Functional Assessment Tool Used: Clinical judgement Functional Limitation: Mobility: Walking and moving around Mobility: Walking and Moving Around Current Status ([id number]): 0 percent impaired, limited or restricted Mobility: Walking and Moving Around Goal Status [phone]): 0 percent impaired, limited or restricted   Vena Austria 08/13/2012, 3:16 PM Durenda Hurt. Renaldo Fiddler, [facility] Acute Rehab Services Pager [phone]

## 2012-08-13 NOTE — Progress Notes (Signed)
[facility]             Progress Note   Subjective: No longer feels dizzy today. C/o a HA. No family at bedside.  Objective: Vital signs in last 24 hours: Temp:  [97.7 F (36.5 C)-98.3 F (36.8 C)] 98.1 F (36.7 C) (02/16 0453) Pulse Rate:  [62-71] 63 (02/16 1002) Resp:  [18-22] 18 (02/16 0453) BP: (128-163)/(47-65) 134/55 mmHg (02/16 1002) SpO2:  [97 %-100 %] 100 % (02/16 1002) Weight:  [82.6 kg (182 lb 1.6 oz)-82.736 kg (182 lb 6.4 oz)] 82.736 kg (182 lb 6.4 oz) (02/16 0453) Weight change:  Last BM Date: 08/12/12  Intake/Output from previous day: 02/15 0701 - 02/16 0700 In: 520 [P.O.:520] Out: 1051 [Urine:1050; Stool:1] Total I/O In: 373 [P.O.:370; I.V.:3] Out: 300 [Urine:300]   Physical Exam: General: Alert, awake, oriented x3, in no acute distress. HEENT: No bruits, no goiter. Heart: Regular rate and rhythm, without murmurs, rubs, gallops. Lungs: Clear to auscultation bilaterally. Abdomen: Soft, nontender, nondistended, positive bowel sounds. Extremities: No clubbing cyanosis or edema with positive pedal pulses. Neuro: Grossly intact, nonfocal.    Lab Results: Basic Metabolic Panel:  Recent Labs  29/56/21 0948 08/13/12 0610  NA 134* 134*  K 4.1 4.3  CL 94* 95*  CO2 31 28  GLUCOSE 319* 339*  BUN 19 21  CREATININE 0.71 0.82  CALCIUM 10.3 10.0   Liver Function Tests:  Recent Labs  08/12/12 0948  AST 16  ALT 12  ALKPHOS 83  BILITOT 0.3  PROT 8.0  ALBUMIN 3.6    Recent Labs  08/12/12 0948  LIPASE 32   CBC:  Recent Labs  08/12/12 0948 08/13/12 0610  WBC 8.8 5.4  NEUTROABS 7.2  --   HGB 12.4 11.3*  HCT 38.9 34.3*  MCV 80.9 81.5  PLT 190 179   Cardiac Enzymes:  Recent Labs  08/12/12 0948  TROPONINI <0.30   BNP:  Recent Labs  08/12/12 0948  PROBNP 301.2   CBG:  Recent Labs  08/12/12 2100 08/13/12 0606 08/13/12 1053  GLUCAP 362* 321* 230*   Coagulation:  Recent Labs  08/12/12 1016  08/13/12 0610  LABPROT 17.4* 18.1*  INR 1.47 1.55*   Urinalysis:  Recent Labs  08/12/12 1408  COLORURINE YELLOW  LABSPEC 1.018  PHURINE 6.0  GLUCOSEU NEGATIVE  HGBUR NEGATIVE  BILIRUBINUR NEGATIVE  KETONESUR NEGATIVE  PROTEINUR NEGATIVE  UROBILINOGEN 1.0  NITRITE NEGATIVE  LEUKOCYTESUR NEGATIVE    Studies/Results: Ct Head Wo Contrast  08/12/2012  *RADIOLOGY REPORT*  Clinical Data: Dizziness.  CT HEAD WITHOUT CONTRAST  Technique:  Contiguous axial images were obtained from the base of the skull through the vertex without contrast.  Comparison: Head CT 12/24/2011.  Findings: There is mild cerebral and cerebellar atrophy.  Extensive patchy and confluent areas of decreased attenuation are noted throughout the deep and periventricular white matter of the cerebral hemispheres bilaterally, compatible with chronic microvascular ischemic disease.  Physiologic calcifications are noted in the basal ganglia bilaterally.  Old lacunar infarct in the inferior aspect of the right putamen.  No definite acute intracranial abnormalities.  Specifically, no definite signs of acute/subacute cerebral ischemia, no evidence of acute intracranial hemorrhage, no mass, mass effect, hydrocephalus or abnormal intra or extra-axial fluid collections.  Calcifications in the left cerebellar hemisphere are unchanged.  No acute displaced skull fractures are identified.  Visualized paranasal sinuses and mastoids are generally well pneumatized, of exception of some mucosal thickening throughout the sphenoid sinuses.  IMPRESSION: 1.  No  acute intracranial abnormalities. 2.  Mild cerebral and cerebellar atrophy with extensive chronic microvascular ischemic changes in the deep and periventricular white matter of the cerebral hemispheres bilaterally and old lacunar infarction in the inferior aspect of the right putamen. 3.  Mucosal thickening in the sphenoid sinus.   Original Report Authenticated By: Trudie Reed, M.D.    Dg  Chest Port 1 View  08/12/2012  *RADIOLOGY REPORT*  Clinical Data: Nausea, weakness.  PORTABLE CHEST - 1 VIEW  Comparison: 06/24/2012  Findings: Left pacer remains in place, unchanged.  Mild cardiomegaly.  Fullness of the hila bilaterally, right greater than left.  I suspect this is vascular, but cannot completely exclude adenopathy.  Mediastinal contours are otherwise within normal limits.  Calcified granuloma in the left midlung.  No confluent airspace opacities or edema.  No effusions or acute bony abnormality.  IMPRESSION: Cardiomegaly.  Mildly prominent hila bilaterally, likely vascular although adenopathy cannot be excluded.   Original Report Authenticated By: Charlett Nose, M.D.     Medications: Scheduled Meds: . amLODipine  10 mg Oral Daily  . clopidogrel  75 mg Oral Daily  . colesevelam  625 mg Oral QHS  . furosemide  80 mg Oral BID  . insulin aspart  0-9 Units Subcutaneous TID WC  . insulin aspart  10 Units Subcutaneous TID AC  . insulin detemir  26 Units Subcutaneous q morning - 10a  . isosorbide mononitrate  60 mg Oral Daily  . latanoprost  1 drop Both Eyes QHS  . levothyroxine  50 mcg Oral QAC breakfast  . magnesium oxide  400 mg Oral TID  . metoprolol  50 mg Oral BID  . omega-3 acid ethyl esters  1 g Oral QHS  . potassium chloride SA  20 mEq Oral BID  . sodium chloride  3 mL Intravenous Q12H  . sotalol  80 mg Oral BID  . warfarin  10 mg Oral ONCE-1800  . Warfarin - Pharmacist Dosing Inpatient   Does not apply q1800   Continuous Infusions:  PRN Meds:.acetaminophen, acetaminophen, albuterol, alum & mag hydroxide-simeth, meclizine, nitroGLYCERIN, ondansetron (ZOFRAN) IV, ondansetron  Assessment/Plan:  Principal Problem:   Vertigo Active Problems:   DIABETES MELLITUS   HYPERLIPIDEMIA   HYPERTENSION   CAD   Atrial fibrillation   Tachycardia-bradycardia syndrome   Chronic diastolic CHF (congestive heart failure)   Vertigo -Resolved. -Likely peripheral vertigo,  ?BPPV. -Await vestibular eval. -Doubt posterior circulation CVA. Unable to do MRI 2/2 PPM.  Atrial Fibrillation -Paced. -Continue BB. -Anticoagulated on coumadin.  DM-II -CBGs uncontrolled. -Will increase levemir to 30 from 26.  HTN -Well controlled. -Continue current medications.  Tachy-Brady -S/p PPM.  Chronic Diastolic CHF -Clinically compensated.  DVT Prophylaxis -SQ heparin while INR is subtherapeutic.  Disposition -Anticipate DC home in am once vestibular eval complete.    Time spent coordinating care: 35 minutes.   LOS: 1 day   [facility] Pager: [phone] 08/13/2012, 11:53 AM

## 2012-08-13 NOTE — Progress Notes (Signed)
ANTICOAGULATION CONSULT NOTE - Follow Up Consult  Pharmacy Consult for coumadin Indication: atrial fibrillation  Allergies  Allergen Reactions  . Ace Inhibitors Cough    Enalapril  benicor    . Crestor (Rosuvastatin Calcium) Other (See Comments)    Fatigue and leg pain   . Lipitor (Atorvastatin Calcium)     Myalgias   . Niaspan (Niacin) Other (See Comments)    unknown  . Olmesartan Cough  . Prednisone     REACTION: "retained fluid"    Patient Measurements: Height: 5\' 2"  (157.5 cm) Weight: 182 lb 6.4 oz (82.736 kg) (scale c) IBW/kg (Calculated) : 50.1  Vital Signs: Temp: 98.1 F (36.7 C) (02/16 0453) Temp src: Oral (02/16 0453) BP: 134/55 mmHg (02/16 1002) Pulse Rate: 63 (02/16 1002)  Labs:  Recent Labs  08/12/12 0948 08/12/12 1016 08/13/12 0610  HGB 12.4  --  11.3*  HCT 38.9  --  34.3*  PLT 190  --  179  APTT  --  36  --   LABPROT  --  17.4* 18.1*  INR  --  1.47 1.55*  CREATININE 0.71  --  0.82  TROPONINI <0.30  --   --     Estimated Creatinine Clearance: 54.5 ml/min (by C-G formula based on Cr of 0.82).   Medications:  Scheduled:  . amLODipine  10 mg Oral Daily  . clopidogrel  75 mg Oral Daily  . colesevelam  625 mg Oral QHS  . furosemide  80 mg Oral BID  . insulin aspart  0-9 Units Subcutaneous TID WC  . insulin aspart  10 Units Subcutaneous TID AC  . insulin detemir  26 Units Subcutaneous q morning - 10a  . isosorbide mononitrate  60 mg Oral Daily  . latanoprost  1 drop Both Eyes QHS  . levothyroxine  50 mcg Oral QAC breakfast  . magnesium oxide  400 mg Oral TID  . metoprolol  50 mg Oral BID  . omega-3 acid ethyl esters  1 g Oral QHS  . potassium chloride SA  20 mEq Oral BID  . sodium chloride  3 mL Intravenous Q12H  . sotalol  80 mg Oral BID  . [COMPLETED] warfarin  12.5 mg Oral Once  . Warfarin - Pharmacist Dosing Inpatient   Does not apply q1800  . [DISCONTINUED] fish oil-omega-3 fatty acids  1 g Oral QHS  . [DISCONTINUED] insulin  aspart  0-9 Units Subcutaneous TID WC    Assessment: 82yof on coumadin pta for afib, admitted with a subtherapeutic INR 1.47. Coumadin to resume while inpatient. Home dose is 10mg  daily except 5mg  MWF.INR today 1.55. CBC wnl. No bleeding noted  Goal of Therapy:  INR 2-3 Monitor platelets by anticoagulation protocol: Yes   Plan:  1) Coumadin 10 mg x 1 tonight 2) Daily INR 3) Monitor s/sx of bleeding   Bola A. Wandra Feinstein D Clinical Pharmacist Pager:[phone] Phone [phone] 08/13/2012 11:07 AM

## 2012-08-13 NOTE — Progress Notes (Signed)
Utilization review completed.  

## 2012-08-13 NOTE — Plan of Care (Signed)
Problem: Phase I Progression Outcomes Goal: Pain controlled with appropriate interventions Outcome: Completed/Met Date Met:  08/13/12 Pt has not had any c/o pain Goal: OOB as tolerated unless otherwise ordered Outcome: Completed/Met Date Met:  08/13/12 Pt oob to bathroom, tolerating activity well, up with PT Goal: Initial discharge plan identified Outcome: Completed/Met Date Met:  08/13/12 Initial plan for d/c is for pt to return home Goal: Voiding-avoid urinary catheter unless indicated Outcome: Completed/Met Date Met:  08/13/12 Pt voids adequate amt of urine in bathroom, no need for foley

## 2012-08-14 LAB — GLUCOSE, CAPILLARY: Glucose-Capillary: 303 mg/dL — ABNORMAL HIGH (ref 70–99)

## 2012-08-14 LAB — PROTIME-INR
INR: 1.83 — ABNORMAL HIGH (ref 0.00–1.49)
Prothrombin Time: 20.5 seconds — ABNORMAL HIGH (ref 11.6–15.2)

## 2012-08-14 MED ORDER — MECLIZINE HCL 12.5 MG PO TABS: 12.5000 mg | ORAL_TABLET | Freq: Two times a day (BID) | ORAL | Status: DC | PRN

## 2012-08-14 MED ORDER — INSULIN DETEMIR 100 UNIT/ML ~~LOC~~ SOLN: 30.0000 [IU] | Freq: Every morning | SUBCUTANEOUS | Status: DC

## 2012-08-14 NOTE — Progress Notes (Signed)
PT Cancellation Note  Patient Details Name: Alexandria [name] MRN: [id number] DOB: August 01, 1929   Cancelled Treatment:    Reason Eval/Treat Not Completed: Other (comment).  Patient preparing for discharge.  Reports dizziness is better and declines PT today.  Instructed patient to contact MD if dizziness/balance worsened for referral for OP PT for vestibular therapy.  Patient with understanding.   Vena Austria 08/14/2012, 11:46 AM

## 2012-08-14 NOTE — Discharge Summary (Signed)
Physician Discharge Summary  Patient ID: Alexandria [name] MRN: [id number] DOB/AGE: 1929/10/19 79 y.o.  Admit date: 08/12/2012 Discharge date: 08/14/2012  Primary Care Physician:  [name], MD   Discharge Diagnoses:    Principal Problem:   Vertigo Active Problems:   DIABETES MELLITUS   HYPERLIPIDEMIA   HYPERTENSION   CAD   Atrial fibrillation   Tachycardia-bradycardia syndrome   Chronic diastolic CHF (congestive heart failure)      Medication List    TAKE these medications       amLODipine 10 MG tablet  Commonly known as:  NORVASC  Take 1 tablet (10 mg total) by mouth daily.     clopidogrel 75 MG tablet  Commonly known as:  PLAVIX  Take 75 mg by mouth daily.     colesevelam 625 MG tablet  Commonly known as:  WELCHOL  Take 625 mg by mouth at bedtime.     fish oil-omega-3 fatty acids 1000 MG capsule  Take 1 g by mouth at bedtime.     furosemide 80 MG tablet  Commonly known as:  LASIX  Take 1 tablet (80 mg total) by mouth 2 (two) times daily.     HEMOCYTE PLUS PO  Take 1 tablet by mouth daily.     insulin detemir 100 UNIT/ML injection  Commonly known as:  LEVEMIR  Inject 30 Units into the skin every morning.     isosorbide mononitrate 60 MG 24 hr tablet  Commonly known as:  IMDUR  Take 60 mg by mouth daily.     latanoprost 0.005 % ophthalmic solution  Commonly known as:  XALATAN  Place 1 drop into both eyes at bedtime.     levothyroxine 50 MCG tablet  Commonly known as:  SYNTHROID, LEVOTHROID  Take 50 mcg by mouth daily.     magnesium oxide 400 MG tablet  Commonly known as:  MAG-OX  Take 400 mg by mouth 3 (three) times daily.     meclizine 12.5 MG tablet  Commonly known as:  ANTIVERT  Take 1 tablet (12.5 mg total) by mouth 2 (two) times daily as needed for dizziness or nausea.     metFORMIN 1000 MG tablet  Commonly known as:  GLUCOPHAGE  Take 1,000 mg by mouth 2 (two) times daily with a meal.     metoprolol 50 MG tablet  Commonly known  as:  LOPRESSOR  Take 50 mg by mouth 2 (two) times daily.     nitroGLYCERIN 0.4 MG SL tablet  Commonly known as:  NITROSTAT  Place 0.4 mg under the tongue every 5 (five) minutes as needed. As needed for chest pain     NOVOLOG FLEXPEN 100 UNIT/ML injection  Generic drug:  insulin aspart  Inject 10 Units into the skin 3 (three) times daily before meals.     potassium chloride SA 20 MEQ tablet  Commonly known as:  K-DUR,KLOR-CON  Take 20 mEq by mouth 2 (two) times daily.     sotalol 80 MG tablet  Commonly known as:  BETAPACE  Take 1 tablet (80 mg total) by mouth 2 (two) times daily.     VITAMIN B-12 IJ  Inject 1 application as directed every 30 (thirty) days. Given at the doctor's office at the end of each month     warfarin 5 MG tablet  Commonly known as:  COUMADIN  Take 5-10 mg by mouth daily. Take 5 mg on Monday, Wednesday, Friday  Take 10 mg on Sunday, Tuesday, Thursday, Saturday  Disposition and Follow-up:  Will be discharged home today in stable condition. Has been advised to follow up with her PCP in 2 weeks.  Consults:  None   Significant Diagnostic Studies:  No results found.  Brief H and P: For complete details please refer to admission H and P, but in brief patient is an 79 year old female with a past medical history of atrial fibrillation on chronic Coumadin therapy, diastolic heart failure, diabetes, hypertension who presented to the ED for evaluation of dizziness. The patient she was in her usual state of health, she woke up early this morning to go to the bathroom, when ambulating she noticed that she was very dizzy and was having difficulty walking. Since it was persistent, and is now associated with nausea she called her niece, who subsequently called EMS and the patient was brought to the hospital for further evaluation and treatment. A CT scan of the head was done in the emergency department, it was negative for acute abnormalities. Although she does  feel slightly better than what she came in with, it was felt that since she lives alone and was still having difficulty with ambulation, it was felt that she needed to be brought into the hospital for further evaluation and treatment.  She denies any headache, fever, chest pain, shortness of breath, history of vomiting or diarrhea. No history of abdominal pain. We were asked to admit her for further evaluation and management.     Hospital Course:  Principal Problem:   Vertigo Active Problems:   DIABETES MELLITUS   HYPERLIPIDEMIA   HYPERTENSION   CAD   Atrial fibrillation   Tachycardia-bradycardia syndrome   Chronic diastolic CHF (congestive heart failure)   Vertigo -Resolved. -CT with no evidence of CVA. -Unable to perform MRI as she has a PPM. -No nystagmus on exam. -no vertigo/dizziness on vestibular eval. -Unclear etiology at this point.  Rest of chronic medical conditions have been stable and her home medications have not changed this hospitalization.   Time spent on Discharge: Greater than 30 minutes.  SignedChaya Jan [facility] Pager: [phone] 08/14/2012, 1:17 PM

## 2012-08-17 ENCOUNTER — Encounter (INDEPENDENT_AMBULATORY_CARE_PROVIDER_SITE_OTHER): Payer: Medicare Other | Admitting: Ophthalmology

## 2012-08-17 DIAGNOSIS — H35039 Hypertensive retinopathy, unspecified eye: Secondary | ICD-10-CM

## 2012-08-17 DIAGNOSIS — E1139 Type 2 diabetes mellitus with other diabetic ophthalmic complication: Secondary | ICD-10-CM

## 2012-08-17 DIAGNOSIS — I10 Essential (primary) hypertension: Secondary | ICD-10-CM

## 2012-08-17 DIAGNOSIS — H43819 Vitreous degeneration, unspecified eye: Secondary | ICD-10-CM

## 2012-08-17 DIAGNOSIS — E11311 Type 2 diabetes mellitus with unspecified diabetic retinopathy with macular edema: Secondary | ICD-10-CM

## 2012-08-17 DIAGNOSIS — E11359 Type 2 diabetes mellitus with proliferative diabetic retinopathy without macular edema: Secondary | ICD-10-CM

## 2012-09-13 ENCOUNTER — Ambulatory Visit (INDEPENDENT_AMBULATORY_CARE_PROVIDER_SITE_OTHER): Payer: Medicare Other | Admitting: Cardiology

## 2012-09-13 ENCOUNTER — Ambulatory Visit (INDEPENDENT_AMBULATORY_CARE_PROVIDER_SITE_OTHER): Payer: Medicare Other | Admitting: Pharmacist

## 2012-09-13 ENCOUNTER — Encounter: Payer: Self-pay | Admitting: Cardiology

## 2012-09-13 VITALS — BP 134/79 | HR 94 | Ht 62.0 in | Wt 182.0 lb

## 2012-09-13 DIAGNOSIS — I4891 Unspecified atrial fibrillation: Secondary | ICD-10-CM | POA: Insufficient documentation

## 2012-09-13 DIAGNOSIS — I6529 Occlusion and stenosis of unspecified carotid artery: Secondary | ICD-10-CM

## 2012-09-13 DIAGNOSIS — I5032 Chronic diastolic (congestive) heart failure: Secondary | ICD-10-CM

## 2012-09-13 DIAGNOSIS — I509 Heart failure, unspecified: Secondary | ICD-10-CM

## 2012-09-13 LAB — POCT INR: INR: 1.6

## 2012-09-13 NOTE — Patient Instructions (Addendum)
The current medical regimen is effective;  continue present plan and medications.  Follow up in 4 months with Dr Hochrein.  You will receive a letter in the mail 2 months before you are due.  Please call us when you receive this letter to schedule your follow up appointment.  

## 2012-09-13 NOTE — Progress Notes (Signed)
HPI The patient presents for  followup after multiple episodes of heart failure and atrial fibrillation.  Since I last saw her she was in the hospital briefly with what was determined to be vertigo.  I did review these records.She has had no acute shortness of breath and denies any PND or orthopnea.  She says that fatigue is her biggest complaint. She denies any ongoing palpitations, presyncope or syncope. She had steady weights and is weighing herself daily. He's watching her salt more carefully.  Allergies  Allergen Reactions  . Ace Inhibitors Cough    Enalapril  benicor    . Crestor (Rosuvastatin Calcium) Other (See Comments)    Fatigue and leg pain   . Lipitor (Atorvastatin Calcium)     Myalgias   . Niaspan (Niacin) Other (See Comments)    unknown  . Olmesartan Cough  . Prednisone     REACTION: "retained fluid"    Current Outpatient Prescriptions  Medication Sig Dispense Refill  . amLODipine (NORVASC) 10 MG tablet Take 1 tablet (10 mg total) by mouth daily.  30 tablet  10  . clopidogrel (PLAVIX) 75 MG tablet Take 75 mg by mouth daily.      . colesevelam (WELCHOL) 625 MG tablet Take 625 mg by mouth at bedtime.       . Cyanocobalamin (VITAMIN B-12 IJ) Inject 1 application as directed every 30 (thirty) days. Given at the doctor's office at the end of each month      . Fe Fum-FA-B Cmp-C-Zn-Mg-Mn-Cu (HEMOCYTE PLUS PO) Take 1 tablet by mouth daily.      . fish oil-omega-3 fatty acids 1000 MG capsule Take 1 g by mouth at bedtime.       . furosemide (LASIX) 80 MG tablet Take 1 tablet (80 mg total) by mouth 2 (two) times daily.  60 tablet  10  . insulin aspart (NOVOLOG FLEXPEN) 100 UNIT/ML injection Inject 10 Units into the skin 3 (three) times daily before meals.      . insulin detemir (LEVEMIR) 100 UNIT/ML injection Inject 30 Units into the skin every morning.  10 mL  1  . isosorbide mononitrate (IMDUR) 60 MG 24 hr tablet Take 60 mg by mouth daily.      Marland Kitchen latanoprost (XALATAN)  0.005 % ophthalmic solution Place 1 drop into both eyes at bedtime.       Marland Kitchen levothyroxine (SYNTHROID, LEVOTHROID) 50 MCG tablet Take 50 mcg by mouth daily.       . magnesium oxide (MAG-OX) 400 MG tablet Take 400 mg by mouth 3 (three) times daily.      . meclizine (ANTIVERT) 12.5 MG tablet Take 1 tablet (12.5 mg total) by mouth 2 (two) times daily as needed for dizziness or nausea.  30 tablet  0  . metFORMIN (GLUCOPHAGE) 1000 MG tablet Take 1,000 mg by mouth 2 (two) times daily with a meal.      . metoprolol (LOPRESSOR) 50 MG tablet Take 50 mg by mouth 2 (two) times daily.      . nitroGLYCERIN (NITROSTAT) 0.4 MG SL tablet Place 0.4 mg under the tongue every 5 (five) minutes as needed. As needed for chest pain      . potassium chloride SA (K-DUR,KLOR-CON) 20 MEQ tablet Take 20 mEq by mouth 2 (two) times daily.       . sotalol (BETAPACE) 80 MG tablet Take 1 tablet (80 mg total) by mouth 2 (two) times daily.  60 tablet  11  . warfarin (COUMADIN)  5 MG tablet Take 5-10 mg by mouth daily. Take 5 mg on Monday, Wednesday, Friday Take 10 mg on Sunday, Tuesday, Thursday, Saturday      . [DISCONTINUED] diltiazem (TIAZAC) 420 MG 24 hr capsule Take 420 mg by mouth daily.       No current facility-administered medications for this visit.    Past Medical History  Diagnosis Date  . Hypertension     with h/o hypertensive urgency  . Glaucoma(365)   . Tremor, essential   . Diabetes mellitus with neuropathy   . B12 deficiency   . Hyperlipidemia   . Atrial fibrillation     a. on chronic anticoagulation with warfarin  b. Tiksosyn discontinued and amiodarone  was discontinued secondary to perceived symptoms  . Chronic diastolic heart failure     a. echo 05/2009: mild LVH, EF 55-60%, grade 2 diast dysfxn, mild LAE, PASP;   b. Echo 3/13:   mod LVH, EF 55-60%, mild to mod calc AV annulus, very mild AS (mean 9 mmHg)  . CAD (coronary artery disease)     a.  b. 07/2011 NSTEMI - PCI/DES LCX - Resolute stent;;   b. LHC  12/27/11: mLAD 30%, prox-mid D1 90% (small);  CFX patent stent, OM3 small 95%,, mRCA 30%, EF 65%  -  med Rx;   . Carotid stenosis     a. dopplers 6/12: 1-39% bilat ICA; b. carotid dopplers 7/13: bilat 1-39% ICA  . Pulmonary fibrosis     Dr. Vassie Loll;  patient taken off O2 in 8/12; dyspnea felt CHF>>ILD  . Skin cancer   . Colon polyps   . Syncope     unknown etiology  . Bradycardia   . Pacemaker 02/29/2012  . Diabetes mellitus     insulin controled  . Elevated TSH 05/2012  . Normocytic anemia 05/2012  . CHF (congestive heart failure)     Past Surgical History  Procedure Laterality Date  . Appendectomy    . Nose surgery    . Colonoscopy    . Cardiac catheterization  2013    stent  . Coronary angioplasty with stent placement    . Insert / replace / remove pacemaker  02/29/2012  . Dilation and curettage of uterus     ROS:  Hair loss.  Otherwise as stated in the HPI and negative for all other systems.  PHYSICAL EXAM BP 134/79  Pulse 94  Ht 5\' 2"  (1.575 m)  Wt 182 lb (82.555 kg)  BMI 33.28 kg/m2 GENERAL:  Well appearing NECK:  No jugular venous distention, waveform within normal limits, carotid upstroke brisk and symmetric, left bruit, no thyromegaly LYMPHATICS:  No cervical, inguinal adenopathy LUNGS:  Bilateral basilar crackles as evidence on previous exams. CHEST:  Well healed pacemaker pocket HEART:  PMI not displaced or sustained,S1 and S2 within normal limits, no S3, no S4, no clicks, no rubs, early peaking apical systolic murmur, no diastolic murmurs murmurs ABD:  Flat, positive bowel sounds normal in frequency in pitch, mid bruits, no rebound, no guarding, no midline pulsatile mass, no hepatomegaly, no splenomegaly EXT:  2 plus pulses throughout, mild ankle edema, no cyanosis no clubbing NEURO:  Cranial nerves II through XII grossly intact, motor grossly intact throughout, resting tremor   ASSESSMENT AND PLAN  FIBRILLATION, ATRIAL -  She seems to be maintaining sinus  rhythm on the Sotalol.  If she has any further episodes likely the next step would likely be AV node ablation.  HYPERTENSION -  Her blood pressure  is controlled today. No change in therapy is indicated.  DIASTOLIC DYSFUNCTION -  She finally seems to be paying more attention to fluid restriction and salt restriction and I think this is keeping her out of the hospital. No change in therapy is indicated. We again reviewed when necessary dosing of her diuretic based on daily weights.  CAD -  She had nonobstructive disease and small vessel disease at the last cath in May 2013.  She has had no new symptoms.   She will continue with risk reduction.

## 2012-09-16 IMAGING — CR DG CHEST 2V
2 series · 2 of 2 positions shown · non-contrast
Comparison: Chest x-ray 08/31/2011.

CLINICAL DATA: Chest pain and weakness.  Shortness of breath after
stent placement 2 weeks ago.

CHEST - 2 VIEW

[w chest pa]
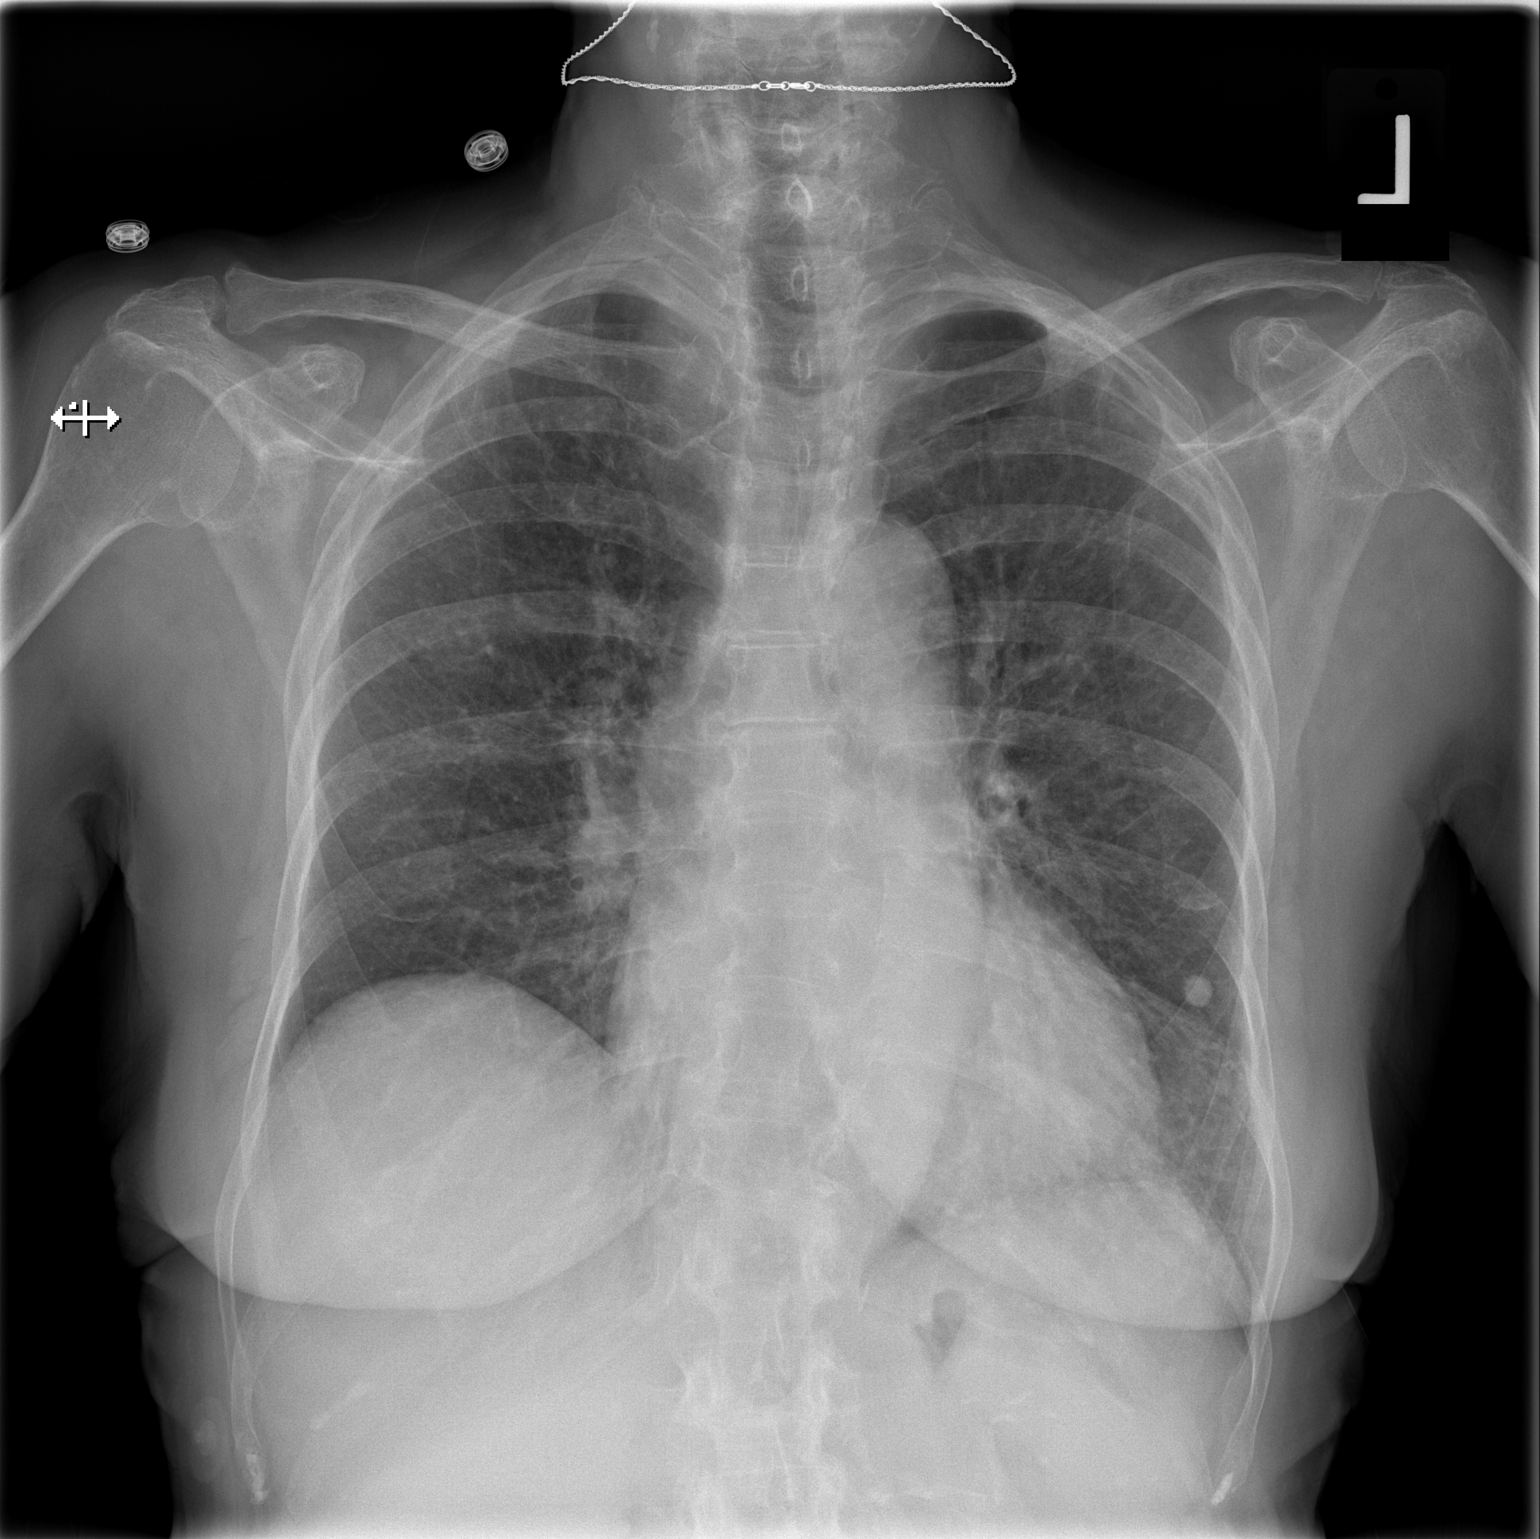

[w chest lat]
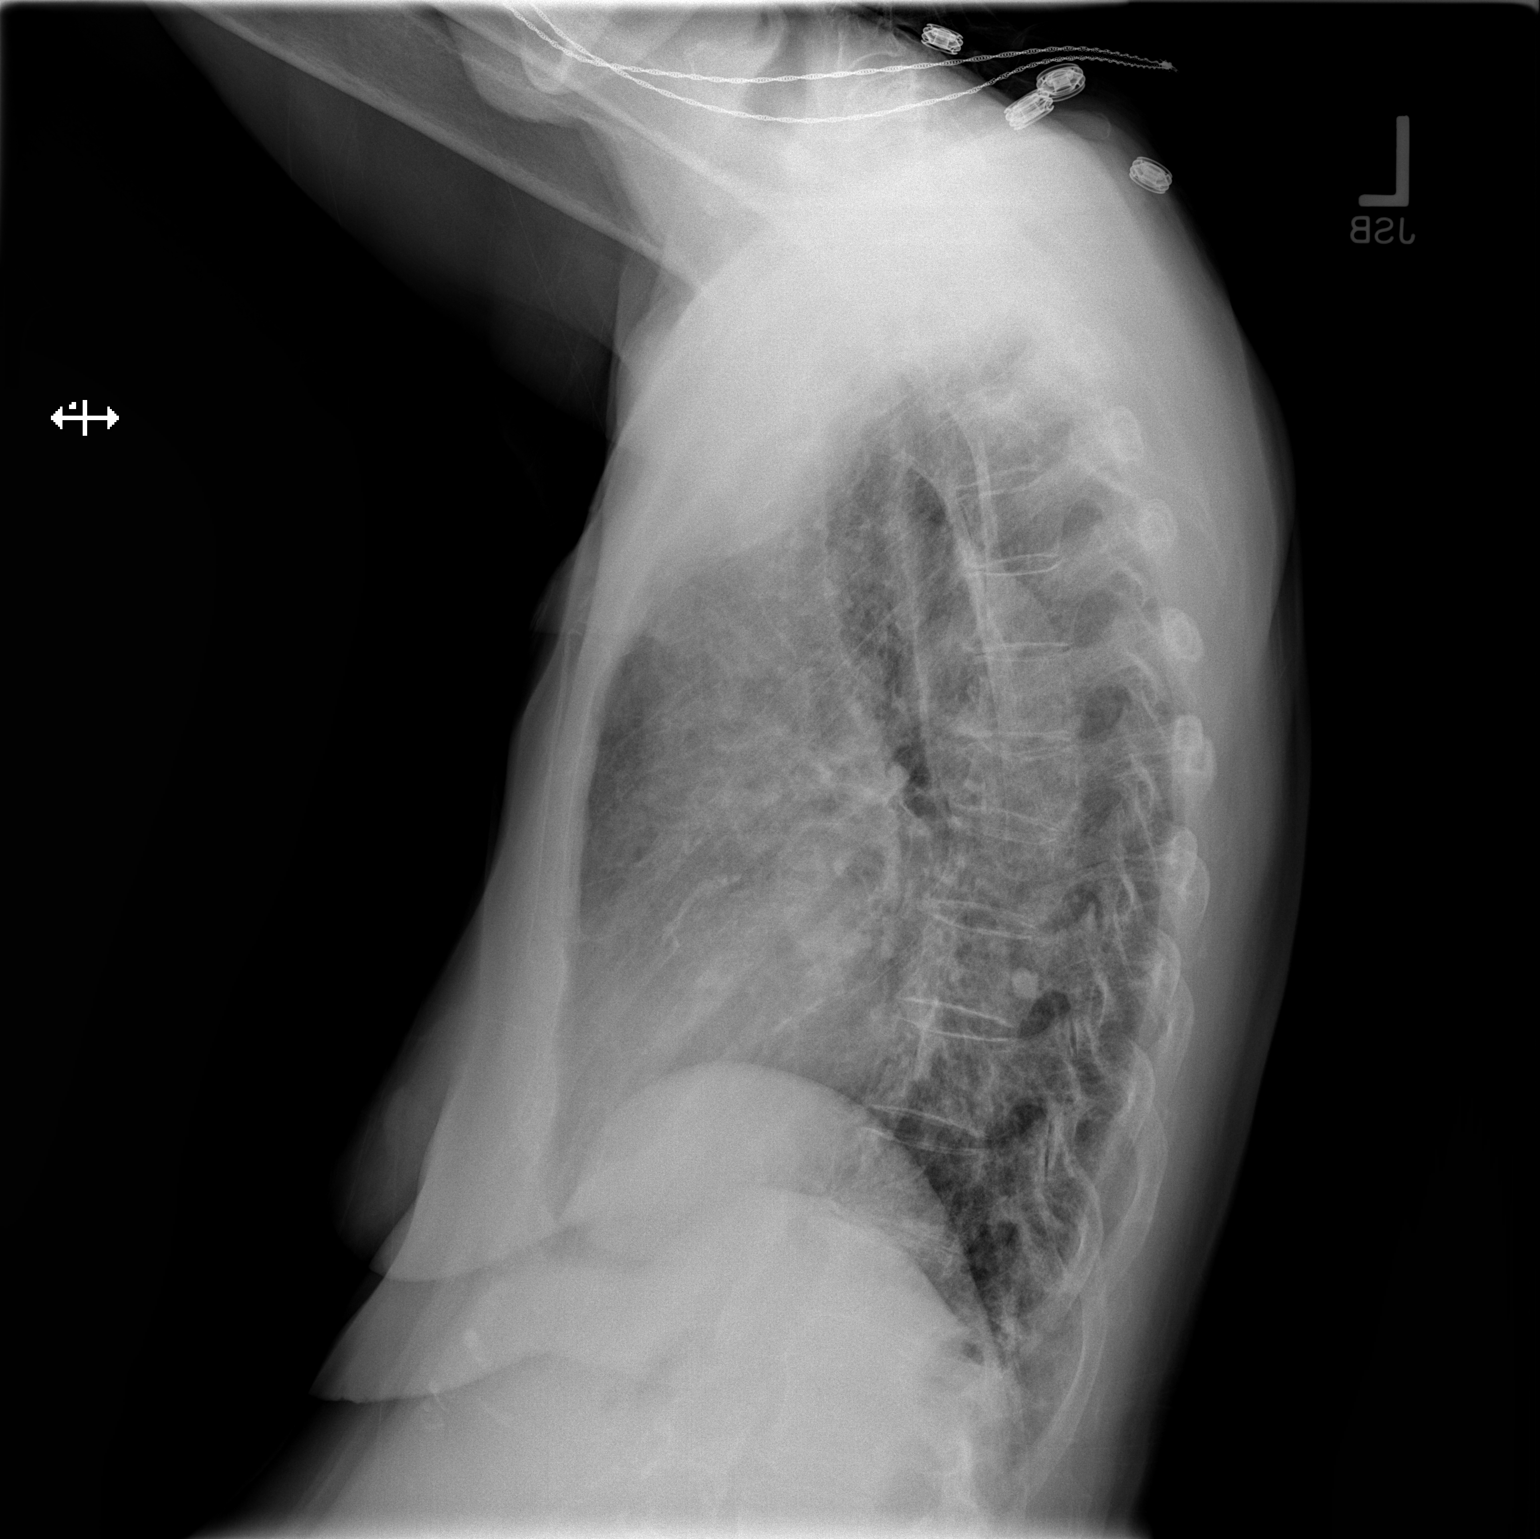

[2 of 2 positions shown; findings below may reference images not displayed]

FINDINGS: Again noted is a calcified granuloma in the left lower
lobe.  Slight prominence of interstitial markings in the lung bases
bilaterally, similar to numerous prior studies, favored to reflect
areas of chronic scarring.  No consolidative airspace disease.  No
definite pleural effusions.  Pulmonary vasculature is normal.
Heart size is upper limits of normal.  Mediastinal contours are
unremarkable.  Atherosclerotic calcifications within the thoracic
aorta.
IMPRESSION: 1.  No radiographic evidence of acute cardiopulmonary disease.
2.  Atherosclerosis.
3.  Calcified granuloma in left lower lobe.

## 2012-09-21 ENCOUNTER — Other Ambulatory Visit: Payer: Self-pay | Admitting: Family Medicine

## 2012-09-22 ENCOUNTER — Other Ambulatory Visit: Payer: Self-pay | Admitting: Family Medicine

## 2012-09-22 MED ORDER — METFORMIN HCL 1000 MG PO TABS: 1000.0000 mg | ORAL_TABLET | Freq: Two times a day (BID) | ORAL | Status: DC

## 2012-09-22 MED ORDER — POTASSIUM CHLORIDE CRYS ER 20 MEQ PO TBCR: 20.0000 meq | EXTENDED_RELEASE_TABLET | Freq: Two times a day (BID) | ORAL | Status: DC

## 2012-09-22 NOTE — Telephone Encounter (Signed)
Sending chart back for you to review dose and instructions. Thanks.

## 2012-09-22 NOTE — Telephone Encounter (Signed)
Refilled metformin and potassium. Please double check with patient on them on the potassium she's taken daily I refilled that at one twice a day. Thanks

## 2012-09-25 NOTE — Telephone Encounter (Signed)
Pt notified of rx's been submitted

## 2012-09-26 ENCOUNTER — Ambulatory Visit (INDEPENDENT_AMBULATORY_CARE_PROVIDER_SITE_OTHER): Payer: Medicare Other | Admitting: Pharmacist

## 2012-09-26 DIAGNOSIS — I4891 Unspecified atrial fibrillation: Secondary | ICD-10-CM

## 2012-09-26 DIAGNOSIS — Z7901 Long term (current) use of anticoagulants: Secondary | ICD-10-CM

## 2012-09-26 LAB — POCT INR: INR: 6.9

## 2012-09-26 LAB — POCT CBC
Granulocyte percent: 78.2 %G (ref 37–80)
HCT, POC: 35.4 % — AB (ref 37.7–47.9)
MCH, POC: 25.9 pg — AB (ref 27–31.2)
MCV: 78.8 fL — AB (ref 80–97)
RBC: 4.5 M/uL (ref 4.04–5.48)

## 2012-09-26 NOTE — Patient Instructions (Signed)
Anticoagulation Dose Instructions as of 09/26/2012     Alexandria [name] Tue Wed Thu Fri Sat   New Dose 10 mg 7.5 mg 10 mg 10 mg 10 mg 7.5 mg 10 mg    Description       Hold for 2 days, then start new dose above.

## 2012-10-02 ENCOUNTER — Ambulatory Visit (INDEPENDENT_AMBULATORY_CARE_PROVIDER_SITE_OTHER): Payer: Medicare Other | Admitting: Pharmacist

## 2012-10-02 DIAGNOSIS — I4891 Unspecified atrial fibrillation: Secondary | ICD-10-CM

## 2012-10-02 LAB — POCT INR: INR: 1.6

## 2012-10-02 NOTE — Progress Notes (Signed)
Today's visit was mostly to check patient's INR but I did ask about her current diabetes control. Pt brought in glucometer.  Avg HBG over last 14 days was 163.   She is taking Levemir 28units qd and Novolog 10units prior to each meal (though patient admits to missing mealtime insulin a few times per week)  Recommend no changes in diabetes medication at this time.  Continue to check BG 2 to 3 times daily.

## 2012-10-02 NOTE — Patient Instructions (Signed)
Anticoagulation Dose Instructions as of 10/02/2012     [name] Tue Wed Thu Fri Sat   New Dose 10 mg 7.5 mg 10 mg 10 mg 10 mg 7.5 mg 10 mg    Description       Continue to take 2 tablets on Sunday, Tuesday, Wednesday, Thrusday and Saturday and to take 1 and 1/2 tablets on Mondays and Fridays.

## 2012-10-03 ENCOUNTER — Encounter: Payer: Self-pay | Admitting: Family Medicine

## 2012-10-04 ENCOUNTER — Encounter (INDEPENDENT_AMBULATORY_CARE_PROVIDER_SITE_OTHER): Payer: Medicare Other | Admitting: Ophthalmology

## 2012-10-04 ENCOUNTER — Ambulatory Visit: Payer: Medicare Other | Admitting: Cardiology

## 2012-10-04 DIAGNOSIS — E1165 Type 2 diabetes mellitus with hyperglycemia: Secondary | ICD-10-CM

## 2012-10-04 DIAGNOSIS — H43819 Vitreous degeneration, unspecified eye: Secondary | ICD-10-CM

## 2012-10-04 DIAGNOSIS — E11359 Type 2 diabetes mellitus with proliferative diabetic retinopathy without macular edema: Secondary | ICD-10-CM

## 2012-10-04 DIAGNOSIS — H35039 Hypertensive retinopathy, unspecified eye: Secondary | ICD-10-CM

## 2012-10-04 DIAGNOSIS — I10 Essential (primary) hypertension: Secondary | ICD-10-CM

## 2012-10-04 DIAGNOSIS — E11311 Type 2 diabetes mellitus with unspecified diabetic retinopathy with macular edema: Secondary | ICD-10-CM

## 2012-10-19 ENCOUNTER — Ambulatory Visit (INDEPENDENT_AMBULATORY_CARE_PROVIDER_SITE_OTHER): Payer: Medicare Other | Admitting: Pharmacist

## 2012-10-19 DIAGNOSIS — I4891 Unspecified atrial fibrillation: Secondary | ICD-10-CM

## 2012-10-19 LAB — POCT INR: INR: 2.9

## 2012-10-20 ENCOUNTER — Other Ambulatory Visit: Payer: Self-pay | Admitting: Family Medicine

## 2012-10-25 ENCOUNTER — Ambulatory Visit (INDEPENDENT_AMBULATORY_CARE_PROVIDER_SITE_OTHER): Payer: Medicare Other | Admitting: General Practice

## 2012-10-25 ENCOUNTER — Encounter: Payer: Self-pay | Admitting: General Practice

## 2012-10-25 VITALS — BP 151/83 | HR 94 | Temp 97.7°F | Ht 62.0 in | Wt 182.5 lb

## 2012-10-25 DIAGNOSIS — N390 Urinary tract infection, site not specified: Secondary | ICD-10-CM

## 2012-10-25 DIAGNOSIS — IMO0001 Reserved for inherently not codable concepts without codable children: Secondary | ICD-10-CM

## 2012-10-25 DIAGNOSIS — R350 Frequency of micturition: Secondary | ICD-10-CM

## 2012-10-25 DIAGNOSIS — E538 Deficiency of other specified B group vitamins: Secondary | ICD-10-CM

## 2012-10-25 LAB — POCT UA - MICROSCOPIC ONLY: Casts, Ur, LPF, POC: NEGATIVE

## 2012-10-25 LAB — POCT URINALYSIS DIPSTICK
Bilirubin, UA: NEGATIVE
Glucose, UA: NEGATIVE
Ketones, UA: NEGATIVE
Nitrite, UA: POSITIVE

## 2012-10-25 MED ORDER — CYANOCOBALAMIN 1000 MCG/ML IJ SOLN
1000.0000 ug | Freq: Once | INTRAMUSCULAR | Status: AC
  Administered 2012-10-25: 1000 ug via INTRAMUSCULAR

## 2012-10-25 MED ORDER — CIPROFLOXACIN HCL 500 MG PO TABS: 500.0000 mg | ORAL_TABLET | Freq: Two times a day (BID) | ORAL | Status: DC

## 2012-10-25 NOTE — Progress Notes (Signed)
  Subjective:    Patient ID: Alexandria [name], female    DOB: [birth year], 79 y.o.   MRN: [id number]  Urinary Tract Infection  This is a new problem. The current episode started in the past 7 days. The problem occurs every urination. The problem has been gradually worsening. The quality of the pain is described as burning and aching. The pain is at a severity of 5/10. The pain is moderate. There has been no fever. She is not sexually active. There is no history of pyelonephritis. Associated symptoms include frequency and urgency. Pertinent negatives include no chills, flank pain, hematuria or nausea. She has tried nothing for the symptoms. The treatment provided no relief. There is no history of recurrent UTIs.      Review of Systems  Constitutional: Negative for fever and chills.  Respiratory: Negative for chest tightness and shortness of breath.   Gastrointestinal: Negative for nausea.  Genitourinary: Positive for dysuria, urgency and frequency. Negative for hematuria and flank pain.  Skin: Negative.   Neurological: Negative for dizziness and headaches.       Objective:   Physical Exam  Constitutional: She is oriented to person, place, and time. She appears well-developed and well-nourished.  Cardiovascular: Normal rate and normal heart sounds.   No murmur heard. Pulmonary/Chest: Effort normal and breath sounds normal. No respiratory distress. She exhibits no tenderness.  Abdominal: Soft. Bowel sounds are normal. She exhibits no distension and no mass. There is no tenderness. There is no rebound and no guarding.  Neurological: She is alert and oriented to person, place, and time.  Skin: Skin is warm and dry.  Psychiatric: She has a normal mood and affect.   Results for orders placed in visit on 10/25/12  POCT URINALYSIS DIPSTICK      Result Value Range   Color, UA gold     Clarity, UA clear     Glucose, UA neg     Bilirubin, UA neg     Ketones, UA neg     Spec Grav, UA 1.010     Blood, UA large     pH, UA 6.5     Protein, UA 4+     Urobilinogen, UA negative     Nitrite, UA positive     Leukocytes, UA large (3+)    POCT UA - MICROSCOPIC ONLY      Result Value Range   WBC, Ur, HPF, POC tntc     RBC, urine, microscopic 1-5     Bacteria, U Microscopic mod     Mucus, UA neg     Epithelial cells, urine per micros occ     Crystals, Ur, HPF, POC neg     Casts, Ur, LPF, POC neg     Yeast, UA neg           Assessment & Plan:  Take medications as prescribed Increase fluid intake AZO over the counter X2 days Frequent voiding Proper perineal hygiene RTO prn Patient verbalized understanding Raymon Mutton, FNP-C

## 2012-10-25 NOTE — Patient Instructions (Signed)
Urinary Tract Infection Urinary tract infections (UTIs) can develop anywhere along your urinary tract. Your urinary tract is your body's drainage system for removing wastes and extra water. Your urinary tract includes two kidneys, two ureters, a bladder, and a urethra. Your kidneys are a pair of bean-shaped organs. Each kidney is about the size of your fist. They are located below your ribs, one on each side of your spine. CAUSES Infections are caused by microbes, which are microscopic organisms, including fungi, viruses, and bacteria. These organisms are so small that they can only be seen through a microscope. Bacteria are the microbes that most commonly cause UTIs. SYMPTOMS  Symptoms of UTIs may vary by age and gender of the patient and by the location of the infection. Symptoms in young women typically include a frequent and intense urge to urinate and a painful, burning feeling in the bladder or urethra during urination. Older women and men are more likely to be tired, shaky, and weak and have muscle aches and abdominal pain. A fever may mean the infection is in your kidneys. Other symptoms of a kidney infection include pain in your back or sides below the ribs, nausea, and vomiting. DIAGNOSIS To diagnose a UTI, your caregiver will ask you about your symptoms. Your caregiver also will ask to provide a urine sample. The urine sample will be tested for bacteria and white blood cells. White blood cells are made by your body to help fight infection. TREATMENT  Typically, UTIs can be treated with medication. Because most UTIs are caused by a bacterial infection, they usually can be treated with the use of antibiotics. The choice of antibiotic and length of treatment depend on your symptoms and the type of bacteria causing your infection. HOME CARE INSTRUCTIONS  If you were prescribed antibiotics, take them exactly as your caregiver instructs you. Finish the medication even if you feel better after you  have only taken some of the medication.  Drink enough water and fluids to keep your urine clear or pale yellow.  Avoid caffeine, tea, and carbonated beverages. They tend to irritate your bladder.  Empty your bladder often. Avoid holding urine for long periods of time.  Empty your bladder before and after sexual intercourse.  After a bowel movement, women should cleanse from front to back. Use each tissue only once. SEEK MEDICAL CARE IF:   You have back pain.  You develop a fever.  Your symptoms do not begin to resolve within 3 days. SEEK IMMEDIATE MEDICAL CARE IF:   You have severe back pain or lower abdominal pain.  You develop chills.  You have nausea or vomiting.  You have continued burning or discomfort with urination. MAKE SURE YOU:   Understand these instructions.  Will watch your condition.  Will get help right away if you are not doing well or get worse. Document Released: 03/24/2005 Document Revised: 12/14/2011 Document Reviewed: 07/23/2011 [employer] Patient Information 2013 [employer].  

## 2012-11-02 ENCOUNTER — Other Ambulatory Visit: Payer: Self-pay | Admitting: Physician Assistant

## 2012-11-22 ENCOUNTER — Encounter (INDEPENDENT_AMBULATORY_CARE_PROVIDER_SITE_OTHER): Payer: Medicare Other | Admitting: Ophthalmology

## 2012-11-22 DIAGNOSIS — H43819 Vitreous degeneration, unspecified eye: Secondary | ICD-10-CM

## 2012-11-22 DIAGNOSIS — E11359 Type 2 diabetes mellitus with proliferative diabetic retinopathy without macular edema: Secondary | ICD-10-CM

## 2012-11-22 DIAGNOSIS — I10 Essential (primary) hypertension: Secondary | ICD-10-CM

## 2012-11-22 DIAGNOSIS — E1139 Type 2 diabetes mellitus with other diabetic ophthalmic complication: Secondary | ICD-10-CM

## 2012-11-22 DIAGNOSIS — E11311 Type 2 diabetes mellitus with unspecified diabetic retinopathy with macular edema: Secondary | ICD-10-CM

## 2012-11-22 DIAGNOSIS — H35039 Hypertensive retinopathy, unspecified eye: Secondary | ICD-10-CM

## 2012-11-23 ENCOUNTER — Ambulatory Visit (INDEPENDENT_AMBULATORY_CARE_PROVIDER_SITE_OTHER): Payer: Medicare Other | Admitting: Pharmacist

## 2012-11-23 ENCOUNTER — Other Ambulatory Visit: Payer: Self-pay | Admitting: Family Medicine

## 2012-11-23 VITALS — BP 102/60 | HR 76 | Ht 62.0 in | Wt 186.0 lb

## 2012-11-23 DIAGNOSIS — I4891 Unspecified atrial fibrillation: Secondary | ICD-10-CM

## 2012-11-23 DIAGNOSIS — I10 Essential (primary) hypertension: Secondary | ICD-10-CM

## 2012-11-23 DIAGNOSIS — E119 Type 2 diabetes mellitus without complications: Secondary | ICD-10-CM

## 2012-11-23 LAB — POCT INR: INR: 2.7

## 2012-11-23 NOTE — Progress Notes (Signed)
Diabetes Follow-Up Visit Chief Complaint:   Chief Complaint  Patient presents with  . Diabetes  . Anticoagulation     Filed Vitals:   11/23/12 1106  BP: 102/60  Pulse: 76    Filed Weights   11/23/12 1106  Weight: 186 lb (84.369 kg)     HPI: patient with long standing diabetes.  Currently taking Levemir 30 units daily and Novolog 10 unit prior to each meal And metfromin 1000mg  bid.  Patient brings in her element compact glucometer along with one touch test strips that were sent by mail order company - [employer] [place].  Patient has not checked BG in 3 days because the test strips sent cannot be used in her glucometer   Exam Edema:  Negative Dizziness - positive  Polyuria:  negative Polydipsia:  negative Polyphagia:  negative  BMI:  Body mass index is 34.01 kg/(m^2).   Weight changes:  stable General Appearance:  well nourished Mood/Affect:  normal   Low fat/carbohydrate diet?  Yes Nicotine Abuse?  No Medication Compliance?  Yes Exercise?  No Alcohol Abuse?  No  Home BG Monitoring:  Checking 2 times a day. Average:  155  High: 206  Low:  65    Lab Results  Component Value Date   HGBA1C 7.6* 05/31/2012    No results found for this basenameConcepcion [name]    Lab Results  Component Value Date   CHOL 186 01/22/2012   HDL 50 01/22/2012   LDLCALC 102* 01/22/2012   TRIG 169* 01/22/2012   CHOLHDL 3.7 01/22/2012    INR was 2.7 today   Assessment: 1.  Diabetes.  Uncontrolled  2.  Blood Pressure.  Slight low today and patient c/o dizziness 3.  Therapeutic anticoagulation  Recommendations: 1.  Medication recommendations at this time are as follows:  Decrease amlodipine to 10mg  1/2 tablet daily 2.  Reviewed HBG goals:  Fasting 80-130 and 1-2 hour post prandial <180.  Patient is instructed to check BG 3 times per day.    3.  BP goal < 140/80. 4.  LDL goal of < 100, HDL > 40 and TG < 150. 5.  Eye Exam yearly and Dental Exam every 6 months. 6.   Dietary recommendations:  Continue to limit portion of CHO 7. Continue current warfarin dose  Anticoagulation Dose Instructions as of 11/23/2012     [name] Tue Wed Thu Fri Sat   New Dose 10 mg 7.5 mg 10 mg 10 mg 10 mg 7.5 mg 10 mg    Description       Continue to take 2 tablets on Sunday, Tuesday, Wednesday, Thrusday and Saturday and to take 1 and 1/2 tablets on Mondays and Fridays.       8.  Return to clinic in 2 wks - seeing Dr. Edsel Petrin  1 month [facility] for protime and follow up DM   Time spent counseling patient:  40 minutes

## 2012-11-23 NOTE — Patient Instructions (Addendum)
Anticoagulation Dose Instructions as of 11/23/2012     [name] Tue Wed Thu Fri Sat   New Dose 10 mg 7.5 mg 10 mg 10 mg 10 mg 7.5 mg 10 mg    Description       Continue to take 2 tablets on Sunday, Tuesday, Wednesday, Thrusday and Saturday and to take 1 and 1/2 tablets on Mondays and Fridays.      INR was 2.7 today

## 2012-12-05 ENCOUNTER — Ambulatory Visit (INDEPENDENT_AMBULATORY_CARE_PROVIDER_SITE_OTHER): Payer: Medicare Other | Admitting: Family Medicine

## 2012-12-05 ENCOUNTER — Encounter: Payer: Self-pay | Admitting: Family Medicine

## 2012-12-05 VITALS — BP 133/71 | HR 93 | Temp 98.5°F | Wt 184.6 lb

## 2012-12-05 DIAGNOSIS — E785 Hyperlipidemia, unspecified: Secondary | ICD-10-CM

## 2012-12-05 DIAGNOSIS — E119 Type 2 diabetes mellitus without complications: Secondary | ICD-10-CM

## 2012-12-05 DIAGNOSIS — I2510 Atherosclerotic heart disease of native coronary artery without angina pectoris: Secondary | ICD-10-CM

## 2012-12-05 DIAGNOSIS — I10 Essential (primary) hypertension: Secondary | ICD-10-CM

## 2012-12-05 DIAGNOSIS — I6529 Occlusion and stenosis of unspecified carotid artery: Secondary | ICD-10-CM

## 2012-12-05 DIAGNOSIS — E669 Obesity, unspecified: Secondary | ICD-10-CM

## 2012-12-05 DIAGNOSIS — E039 Hypothyroidism, unspecified: Secondary | ICD-10-CM

## 2012-12-05 DIAGNOSIS — I495 Sick sinus syndrome: Secondary | ICD-10-CM

## 2012-12-05 DIAGNOSIS — R5381 Other malaise: Secondary | ICD-10-CM

## 2012-12-05 DIAGNOSIS — I491 Atrial premature depolarization: Secondary | ICD-10-CM

## 2012-12-05 DIAGNOSIS — I4891 Unspecified atrial fibrillation: Secondary | ICD-10-CM

## 2012-12-05 LAB — POCT CBC
Granulocyte percent: 76.3 %G (ref 37–80)
HCT, POC: 37.2 % — AB (ref 37.7–47.9)
Hemoglobin: 12.4 g/dL (ref 12.2–16.2)
Lymph, poc: 1.8 (ref 0.6–3.4)
MCH, POC: 27.7 pg (ref 27–31.2)
MCHC: 33.3 g/dL (ref 31.8–35.4)
MCV: 83 fL (ref 80–97)
MPV: 8.4 fL (ref 0–99.8)
POC Granulocyte: 6.9 (ref 2–6.9)
POC LYMPH PERCENT: 19.7 %L (ref 10–50)
Platelet Count, POC: 211 10*3/uL (ref 142–424)
RBC: 4.5 M/uL (ref 4.04–5.48)
RDW, POC: 16.2 %
WBC: 9 10*3/uL (ref 4.6–10.2)

## 2012-12-05 LAB — POCT GLYCOSYLATED HEMOGLOBIN (HGB A1C): Hemoglobin A1C: 7.3

## 2012-12-05 LAB — VITAMIN B12: Vitamin B-12: 492 pg/mL (ref 211–911)

## 2012-12-05 LAB — TSH: TSH: 3.933 u[IU]/mL (ref 0.350–4.500)

## 2012-12-05 NOTE — Progress Notes (Signed)
Patient ID: Alexandria [name], female   DOB: June 28, 1930, 79 y.o.   MRN: [id number] SUBJECTIVE: CC: Chief Complaint  Patient presents with  . Follow-up    4 month follow up wants b12 level checked  stated used to get b12 shots    . Fatigue    HPI: Patient is here for follow up of Diabetes Mellitus/hypertension/hyperlipidemia: Symptoms of DM: Denies Nocturia ,Denies Urinary Frequency , denies Blurred vision ,deniesDizziness,denies.Dysuria,denies paresthesias, denies extremity pain or ulcers.Marland Kitchendenies chest pain. has had an annual eye exam. do check the feet. Does check CBGs. Average CBG:117 Denies episodes of hypoglycemia. Does have an emergency hypoglycemic plan. admits toCompliance with medications. Denies Problems with medications.  Breakfast: 2 pieces cinnamon raison toast and OJ to take meds. Lunch: apple at 2 pm Supper:sandwich  PMH/PSH: reviewed/updated in Epic  SH/FH: reviewed/updated in Epic  Allergies: reviewed/updated in Epic  Medications: reviewed/updated in Epic Current outpatient prescriptions:amLODipine (NORVASC) 10 MG tablet, Take 0.5 tablets (5 mg total) by mouth daily., Disp: 30 tablet, Rfl: 10;  clopidogrel (PLAVIX) 75 MG tablet, Take 75 mg by mouth daily., Disp: , Rfl: ;  colesevelam (WELCHOL) 625 MG tablet, Take 625 mg by mouth at bedtime. , Disp: , Rfl:  Cyanocobalamin (VITAMIN B-12 IJ), Inject 1 application as directed every 30 (thirty) days. Given at the doctor's office at the end of each month, Disp: , Rfl: ;  Fe Fum-FA-B Cmp-C-Zn-Mg-Mn-Cu (HEMOCYTE PLUS PO), Take 1 tablet by mouth daily., Disp: , Rfl: ;  fish oil-omega-3 fatty acids 1000 MG capsule, Take 1 g by mouth at bedtime. , Disp: , Rfl:  furosemide (LASIX) 80 MG tablet, Take 1 tablet (80 mg total) by mouth 2 (two) times daily., Disp: 60 tablet, Rfl: 10;  insulin aspart (NOVOLOG FLEXPEN) 100 UNIT/ML injection, Inject 10 Units into the skin 3 (three) times daily before meals., Disp: , Rfl: ;  insulin detemir  (LEVEMIR) 100 UNIT/ML injection, Inject 28 Units into the skin every morning., Disp: , Rfl:  isosorbide mononitrate (IMDUR) 60 MG 24 hr tablet, Take 60 mg by mouth daily., Disp: , Rfl: ;  latanoprost (XALATAN) 0.005 % ophthalmic solution, Place 1 drop into both eyes at bedtime. , Disp: , Rfl: ;  levothyroxine (SYNTHROID, LEVOTHROID) 50 MCG tablet, TAKE ONE TABLET BY MOUTH EVERY MORNING ON EMPTY STOMACH, Disp: 30 tablet, Rfl: 9 meclizine (ANTIVERT) 12.5 MG tablet, Take 1 tablet (12.5 mg total) by mouth 2 (two) times daily as needed for dizziness or nausea., Disp: 30 tablet, Rfl: 0;  metFORMIN (GLUCOPHAGE) 1000 MG tablet, Take 1 tablet (1,000 mg total) by mouth 2 (two) times daily with a meal., Disp: 60 tablet, Rfl: 5;  metoprolol (LOPRESSOR) 50 MG tablet, Take 50 mg by mouth 2 (two) times daily., Disp: , Rfl:  nitroGLYCERIN (NITROSTAT) 0.4 MG SL tablet, Place 0.4 mg under the tongue every 5 (five) minutes as needed. As needed for chest pain, Disp: , Rfl: ;  potassium chloride SA (K-DUR,KLOR-CON) 20 MEQ tablet, Take 1 tablet (20 mEq total) by mouth 2 (two) times daily., Disp: 60 tablet, Rfl: 5;  sotalol (BETAPACE) 80 MG tablet, Take 1 tablet (80 mg total) by mouth 2 (two) times daily., Disp: 60 tablet, Rfl: 11 warfarin (COUMADIN) 5 MG tablet, TAKE 1 OR 2 TABLETS BY MOUTH DAILY OR AS DIRECTED BY ANTICOAGULATIONCLINIC, Disp: 60 tablet, Rfl: 2;  [DISCONTINUED] diltiazem (TIAZAC) 420 MG 24 hr capsule, Take 420 mg by mouth daily., Disp: , Rfl:   Immunizations: reviewed/updated in Epic  ROS: As above  in the HPI. All other systems are stable or negative.  OBJECTIVE: APPEARANCE:  Patient in no acute distress.The patient appeared well nourished and normally developed. Acyanotic. Waist: VITAL SIGNS:BP 133/71  Pulse 93  Temp(Src) 98.5 F (36.9 C) (Oral)  Wt 184 lb 9.6 oz (83.734 kg)  BMI 33.76 kg/m2 WF obese  SKIN: warm and  Dry without overt rashes, tattoos and scars  HEAD and Neck: without JVD, Head  and scalp: normal Eyes:No scleral icterus. Fundi normal, eye movements normal. Ears: Auricle normal, canal normal, Tympanic membranes normal, insufflation normal. Nose: normal Throat: normal Neck & thyroid: normal  CHEST & LUNGS: Chest wall: normal Lungs: Clear  CVS: Reveals the PMI to be normally located. Regular rhythm, First and Second Heart sounds are normal,  absence of murmurs, rubs or gallops. Peripheral vasculature: Radial pulses: normal Dorsal pedis pulses: normal Posterior pulses: normal  ABDOMEN:  Appearance: normal Benign, no organomegaly, no masses, no Abdominal Aortic enlargement. No Guarding , no rebound. No Bruits. Bowel sounds: normal  RECTAL: N/A GU: N/A  EXTREMETIES: nonedematous. Both Femoral and Pedal pulses are normal.  MUSCULOSKELETAL:  Spine: normal Joints: intact  NEUROLOGIC: oriented to time,place and person; nonfocal. Strength is normal Sensory is normal Reflexes are normal Cranial Nerves are normal.  ASSESSMENT: Tachycardia-bradycardia syndrome  PREMATURE ATRIAL CONTRACTIONS  OBESITY  Occlusion and stenosis of carotid artery without mention of cerebral infarction, unspecified laterality  HYPERTENSION  HYPERLIPIDEMIA - Plan: COMPLETE METABOLIC PANEL WITH GFR, NMR Lipoprofile with Lipids  DIABETES MELLITUS - Plan: POCT glycosylated hemoglobin (Hb A1C), COMPLETE METABOLIC PANEL WITH GFR, CANCELED: POCT UA - Microalbumin  Afib  CAD  Unspecified hypothyroidism - Plan: TSH  Other malaise and fatigue - Plan: Vitamin B12, Vitamin D 25 hydroxy, POCT CBC    PLAN: Orders Placed This Encounter  Procedures  . COMPLETE METABOLIC PANEL WITH GFR  . NMR Lipoprofile with Lipids  . TSH  . Vitamin B12  . Vitamin D 25 hydroxy  . POCT glycosylated hemoglobin (Hb A1C)  . POCT CBC   Meds ordered this encounter  Medications  . insulin detemir (LEVEMIR) 100 UNIT/ML injection    Sig: Inject 28 Units into the skin every morning.   continue other meds. Await results.  Return in about 3 months (around 03/07/2013) for Recheck medical problems.  Teo Moede P. Modesto Charon, M.D.

## 2012-12-06 LAB — COMPLETE METABOLIC PANEL WITH GFR
ALT: 10 U/L (ref 0–35)
AST: 14 U/L (ref 0–37)
Albumin: 4.2 g/dL (ref 3.5–5.2)
Alkaline Phosphatase: 68 U/L (ref 39–117)
BUN: 18 mg/dL (ref 6–23)
CO2: 31 mEq/L (ref 19–32)
Calcium: 10.9 mg/dL — ABNORMAL HIGH (ref 8.4–10.5)
Chloride: 100 mEq/L (ref 96–112)
Creat: 0.96 mg/dL (ref 0.50–1.10)
GFR, Est African American: 65 mL/min
GFR, Est Non African American: 56 mL/min — ABNORMAL LOW
Glucose, Bld: 89 mg/dL (ref 70–99)
Potassium: 4.6 mEq/L (ref 3.5–5.3)
Sodium: 141 mEq/L (ref 135–145)
Total Bilirubin: 0.3 mg/dL (ref 0.3–1.2)
Total Protein: 7.4 g/dL (ref 6.0–8.3)

## 2012-12-06 LAB — VITAMIN D 25 HYDROXY (VIT D DEFICIENCY, FRACTURES): Vit D, 25-Hydroxy: 40 ng/mL (ref 30–89)

## 2012-12-07 LAB — NMR LIPOPROFILE WITH LIPIDS
Cholesterol, Total: 193 mg/dL (ref <200)
HDL Particle Number: 24.7 umol/L — ABNORMAL LOW (ref >=30.5)
HDL Size: 9.1 nm — ABNORMAL LOW (ref >=9.2)
HDL-C: 45 mg/dL (ref >=40)
LDL (calc): 103 mg/dL — ABNORMAL HIGH (ref <100)
LDL Particle Number: 1497 nmol/L — ABNORMAL HIGH (ref <1000)
LDL Size: 21.6 nm (ref >20.5)
LP-IR Score: 61 — ABNORMAL HIGH (ref <=45)
Large HDL-P: 4.8 umol/L (ref >=4.8)
Large VLDL-P: 7.8 nmol/L — ABNORMAL HIGH (ref <=2.7)
Small LDL Particle Number: 593 nmol/L — ABNORMAL HIGH (ref <=527)
Triglycerides: 227 mg/dL — ABNORMAL HIGH (ref <150)
VLDL Size: 57.8 nm — ABNORMAL HIGH (ref <=46.6)

## 2012-12-13 ENCOUNTER — Other Ambulatory Visit: Payer: Self-pay | Admitting: Family Medicine

## 2012-12-25 ENCOUNTER — Encounter: Payer: Self-pay | Admitting: Pharmacist

## 2012-12-25 ENCOUNTER — Ambulatory Visit: Payer: Self-pay | Admitting: Pharmacist

## 2012-12-25 ENCOUNTER — Ambulatory Visit (INDEPENDENT_AMBULATORY_CARE_PROVIDER_SITE_OTHER): Payer: Medicare Other | Admitting: Pharmacist

## 2012-12-25 VITALS — BP 112/58 | HR 60 | Wt 187.0 lb

## 2012-12-25 DIAGNOSIS — I4891 Unspecified atrial fibrillation: Secondary | ICD-10-CM

## 2012-12-25 DIAGNOSIS — E119 Type 2 diabetes mellitus without complications: Secondary | ICD-10-CM

## 2012-12-25 DIAGNOSIS — E785 Hyperlipidemia, unspecified: Secondary | ICD-10-CM

## 2012-12-25 LAB — POCT INR: INR: 3.2

## 2012-12-25 MED ORDER — PITAVASTATIN CALCIUM 2 MG PO TABS: 2.0000 mg | ORAL_TABLET | Freq: Every day | ORAL | Status: DC

## 2012-12-25 NOTE — Progress Notes (Signed)
Diabetes Follow-Up Visit Chief Complaint:   Chief Complaint  Patient presents with  . Diabetes     Filed Vitals:   12/25/12 1453  BP: 112/58  Pulse: 60    Filed Weights   12/25/12 1453  Weight: 187 lb (84.823 kg)     HPI: patient with long standing diabetes.  Currently taking Levemir 28 units daily and Novolog 10 unit prior to each meal And metfromin 1000mg  bid.  Patient also had labs this month and LDL-P and Tg were elevated.  She is not currently taking medication for hyperlipidemia.  She has tried lipitor and crestor in past - caused myalgias.  She also took Welchol - too many tablets   Exam Edema:  Negative Polyuria:  negative Polydipsia:  negative Polyphagia:  negative  BMI:  Body mass index is 34.19 kg/(m^2).   Weight changes:  stable General Appearance:  well nourished Mood/Affect:  normal   Low fat/carbohydrate diet?  Yes Nicotine Abuse?  No Medication Compliance?  Yes Exercise?  No Alcohol Abuse?  No  Home BG Monitoring:  Checking 2 times a day. Average:  175  High: 268  Low:  67    Lab Results  Component Value Date   HGBA1C 7.3% 12/05/2012    No results found for this basenameConcepcion Elk    Lab Results  Component Value Date   CHOL 186 01/22/2012   HDL 50 01/22/2012   LDLCALC 103* 12/05/2012   TRIG 227* 12/05/2012   CHOLHDL 3.7 01/22/2012    LDL-P was 1497 (12/05/2012)   Assessment: 1.  Diabetes.  Uncontrolled  2.  Blood Pressure. Good today 3.  Dyslipidemia / Hyperlipidemia - elevated LDL-P and Tg  Recommendations: 1.  Medication recommendations at this time are as follows:  Add livalo 2mg  daily 2.  Reviewed HBG goals:  Fasting 80-130 and 1-2 hour post prandial <180.  Patient is instructed to check BG 3 times per day.    3.  BP goal < 140/80. 4.  LDL goal of < 100, HDL > 40 and TG < 150. 5.  Eye Exam yearly and Dental Exam every 6 months. 6.  Dietary recommendations:  Continue to limit portion of CHO   7.  Return to clinic in  21month    Time spent counseling patient:  40 minutes

## 2013-01-03 ENCOUNTER — Encounter (INDEPENDENT_AMBULATORY_CARE_PROVIDER_SITE_OTHER): Payer: Medicare Other | Admitting: Ophthalmology

## 2013-01-03 DIAGNOSIS — E1139 Type 2 diabetes mellitus with other diabetic ophthalmic complication: Secondary | ICD-10-CM

## 2013-01-03 DIAGNOSIS — H43819 Vitreous degeneration, unspecified eye: Secondary | ICD-10-CM

## 2013-01-03 DIAGNOSIS — E11311 Type 2 diabetes mellitus with unspecified diabetic retinopathy with macular edema: Secondary | ICD-10-CM

## 2013-01-03 DIAGNOSIS — H35039 Hypertensive retinopathy, unspecified eye: Secondary | ICD-10-CM

## 2013-01-03 DIAGNOSIS — E1165 Type 2 diabetes mellitus with hyperglycemia: Secondary | ICD-10-CM

## 2013-01-03 DIAGNOSIS — I10 Essential (primary) hypertension: Secondary | ICD-10-CM

## 2013-01-03 DIAGNOSIS — E11359 Type 2 diabetes mellitus with proliferative diabetic retinopathy without macular edema: Secondary | ICD-10-CM

## 2013-01-04 ENCOUNTER — Encounter: Payer: Self-pay | Admitting: Gastroenterology

## 2013-01-12 ENCOUNTER — Other Ambulatory Visit: Payer: Self-pay | Admitting: Family Medicine

## 2013-01-15 ENCOUNTER — Other Ambulatory Visit: Payer: Self-pay | Admitting: Family Medicine

## 2013-01-17 ENCOUNTER — Other Ambulatory Visit (INDEPENDENT_AMBULATORY_CARE_PROVIDER_SITE_OTHER): Payer: Medicare Other | Admitting: Vascular Surgery

## 2013-01-17 ENCOUNTER — Ambulatory Visit: Payer: Medicare Other | Admitting: Neurosurgery

## 2013-01-17 DIAGNOSIS — I6529 Occlusion and stenosis of unspecified carotid artery: Secondary | ICD-10-CM

## 2013-01-18 ENCOUNTER — Other Ambulatory Visit: Payer: Self-pay | Admitting: *Deleted

## 2013-01-25 ENCOUNTER — Ambulatory Visit (INDEPENDENT_AMBULATORY_CARE_PROVIDER_SITE_OTHER): Payer: Medicare Other | Admitting: Pharmacist

## 2013-01-25 ENCOUNTER — Encounter: Payer: Self-pay | Admitting: Vascular Surgery

## 2013-01-25 ENCOUNTER — Ambulatory Visit: Payer: Self-pay

## 2013-01-25 VITALS — BP 140/60 | HR 64 | Ht 62.0 in | Wt 185.0 lb

## 2013-01-25 DIAGNOSIS — E538 Deficiency of other specified B group vitamins: Secondary | ICD-10-CM

## 2013-01-25 DIAGNOSIS — E785 Hyperlipidemia, unspecified: Secondary | ICD-10-CM

## 2013-01-25 DIAGNOSIS — I4891 Unspecified atrial fibrillation: Secondary | ICD-10-CM

## 2013-01-25 DIAGNOSIS — E119 Type 2 diabetes mellitus without complications: Secondary | ICD-10-CM

## 2013-01-25 DIAGNOSIS — I10 Essential (primary) hypertension: Secondary | ICD-10-CM

## 2013-01-25 LAB — POCT INR: INR: 2.2

## 2013-01-25 MED ORDER — INSULIN ASPART 100 UNIT/ML ~~LOC~~ SOLN: 10.0000 [IU] | Freq: Three times a day (TID) | SUBCUTANEOUS | Status: DC

## 2013-01-25 MED ORDER — CYANOCOBALAMIN 1000 MCG/ML IJ SOLN
1000.0000 ug | INTRAMUSCULAR | Status: AC
  Administered 2013-01-25 – 2013-03-08 (×2): 1000 ug via INTRAMUSCULAR

## 2013-01-25 NOTE — Patient Instructions (Addendum)
Anticoagulation Dose Instructions as of 01/25/2013     Alexandria [name] Tue Wed Thu Fri Sat   New Dose 10 mg 7.5 mg 10 mg 7.5 mg 10 mg 7.5 mg 10 mg    Description       Continue 2 tablets on Sunday, Tuesday, Thrusday and Saturday and to take 1 and 1/2 tablets on Mondays, Wednesdays and Fridays.       INR was 2.2   ** change in novolog dose - continue 10 units before breakfast and lunch but only give 8 units before evening meal.**  Cyanocobalamin, Vitamin B12 injection What is this medicine? CYANOCOBALAMIN (sye an oh koe BAL a min) is a man made form of vitamin B12. Vitamin B12 is used in the growth of healthy blood cells, nerve cells, and proteins in the body. It also helps with the metabolism of fats and carbohydrates. This medicine is used to treat people who can not absorb vitamin B12. This medicine may be used for other purposes; ask your health care provider or pharmacist if you have questions. What should I tell my health care provider before I take this medicine? They need to know if you have any of these conditions: -kidney disease -Leber's disease -megaloblastic anemia -an unusual or allergic reaction to cyanocobalamin, cobalt, other medicines, foods, dyes, or preservatives -pregnant or trying to get pregnant -breast-feeding How should I use this medicine? This medicine is injected into a muscle or deeply under the skin. It is usually given by a health care professional in a clinic or doctor's office. However, your doctor may teach you how to inject yourself. Follow all instructions. Talk to your pediatrician regarding the use of this medicine in children. Special care may be needed. Overdosage: If you think you have taken too much of this medicine contact a poison control center or emergency room at once. NOTE: This medicine is only for you. Do not share this medicine with others. What if I miss a dose? If you are given your dose at a clinic or doctor's office, call to reschedule  your appointment. If you give your own injections and you miss a dose, take it as soon as you can. If it is almost time for your next dose, take only that dose. Do not take double or extra doses. What may interact with this medicine? -colchicine -heavy alcohol intake This list may not describe all possible interactions. Give your health care provider a list of all the medicines, herbs, non-prescription drugs, or dietary supplements you use. Also tell them if you smoke, drink alcohol, or use illegal drugs. Some items may interact with your medicine. What should I watch for while using this medicine? Visit your doctor or health care professional regularly. You may need blood work done while you are taking this medicine. You may need to follow a special diet. Talk to your doctor. Limit your alcohol intake and avoid smoking to get the best benefit. What side effects may I notice from receiving this medicine? Side effects that you should report to your doctor or health care professional as soon as possible: -allergic reactions like skin rash, itching or hives, swelling of the face, lips, or tongue -blue tint to skin -chest tightness, pain -difficulty breathing, wheezing -dizziness -red, swollen painful area on the leg Side effects that usually do not require medical attention (report to your doctor or health care professional if they continue or are bothersome): -diarrhea -headache This list may not describe all possible side effects. Call your  doctor for medical advice about side effects. You may report side effects to FDA at 1-800-FDA-1088. Where should I keep my medicine? Keep out of the reach of children. Store at room temperature between 15 and 30 degrees C (59 and 85 degrees F). Protect from light. Throw away any unused medicine after the expiration date. NOTE: This sheet is a summary. It may not cover all possible information. If you have questions about this medicine, talk to your doctor,  pharmacist, or health care provider.  2012, [employer]. (09/25/2007 10:10:20 PM)

## 2013-01-25 NOTE — Progress Notes (Signed)
Diabetes Follow-Up Visit Chief Complaint:   Chief Complaint  Patient presents with  . Diabetes  . Atrial Fibrillation     Filed Vitals:   01/25/13 1315  BP: 140/60  Pulse: 64    Filed Weights   01/25/13 1315  Weight: 185 lb (83.915 kg)     HPI: patient with long standing diabetes.  Currently taking Levemir 28 units daily, Novolog 10 unit prior to each meal and metfromin 1000mg  bid.   Exam Edema:  Negative  Polyuria:  negative Polydipsia:  negative Polyphagia:  negative  BMI:  Body mass index is 33.83 kg/(m^2).   Weight changes:  stable General Appearance:  well nourished Mood/Affect:  normal   Low fat/carbohydrate diet?  Yes - but occasional dietary indescretions with sweets Nicotine Abuse?  No Medication Compliance?  No  Exercise?  No Alcohol Abuse?  No  Home BG Monitoring:  Checking 1-2 times a day. Average:  152  High: 247 (forgot pre meal insulin dose)  Low:  62  Has had 2-3 hypogycemic episodes that usually occur around 3am    Lab Results  Component Value Date   HGBA1C 7.3% 12/05/2012    No results found for this basename: MICROALBUR,  [id number]      INR was 2.2 today   Assessment: 1.  Diabetes.  Uncontrolled, concerned about hypoglycemia 2.  Blood Pressure.  SBP slightly elevated today  3.  Therapeutic anticoagulation 4.  Hyperlipidemia - non compliant with statin  Recommendations: 1.  Medication recommendations at this time are as follows:    Change novolog to 10 units with breakfast and lunch and 8 units with supper  Restart livalo 2mg  daily - game #21 samples 2.  Reviewed HBG goals:  Fasting 80-130 and 1-2 hour post prandial <180.  Patient is instructed to check BG 2 or 3  times per day.    3.  BP goal < 140/80. 4.  LDL goal of < 100, HDL > 40 and TG < 150. 5.  Eye Exam yearly and Dental Exam every 6 months. 6.  Dietary recommendations:  Continue to limit portion of CHO 7. Continue current warfarin dose  Anticoagulation Dose Instructions as  of 01/25/2013     [name] Tue Wed Thu Fri Sat   New Dose 10 mg 7.5 mg 10 mg 7.5 mg 10 mg 7.5 mg 10 mg    Description       Continue 2 tablets on Sunday, Tuesday, Thrusday and Saturday and to take 1 and 1/2 tablets on Mondays, Wednesdays and Fridays.       8.  Return to clinic 1 month [facility] for protime and follow up DM   Time spent counseling patient:  30 minutes

## 2013-02-01 IMAGING — CR DG CHEST 2V
2 series · 2 of 2 positions shown · non-contrast
Comparison: PA and lateral chest 12/24/2011.

CLINICAL DATA: Chest tightness.

CHEST - 2 VIEW

[w chest pa]
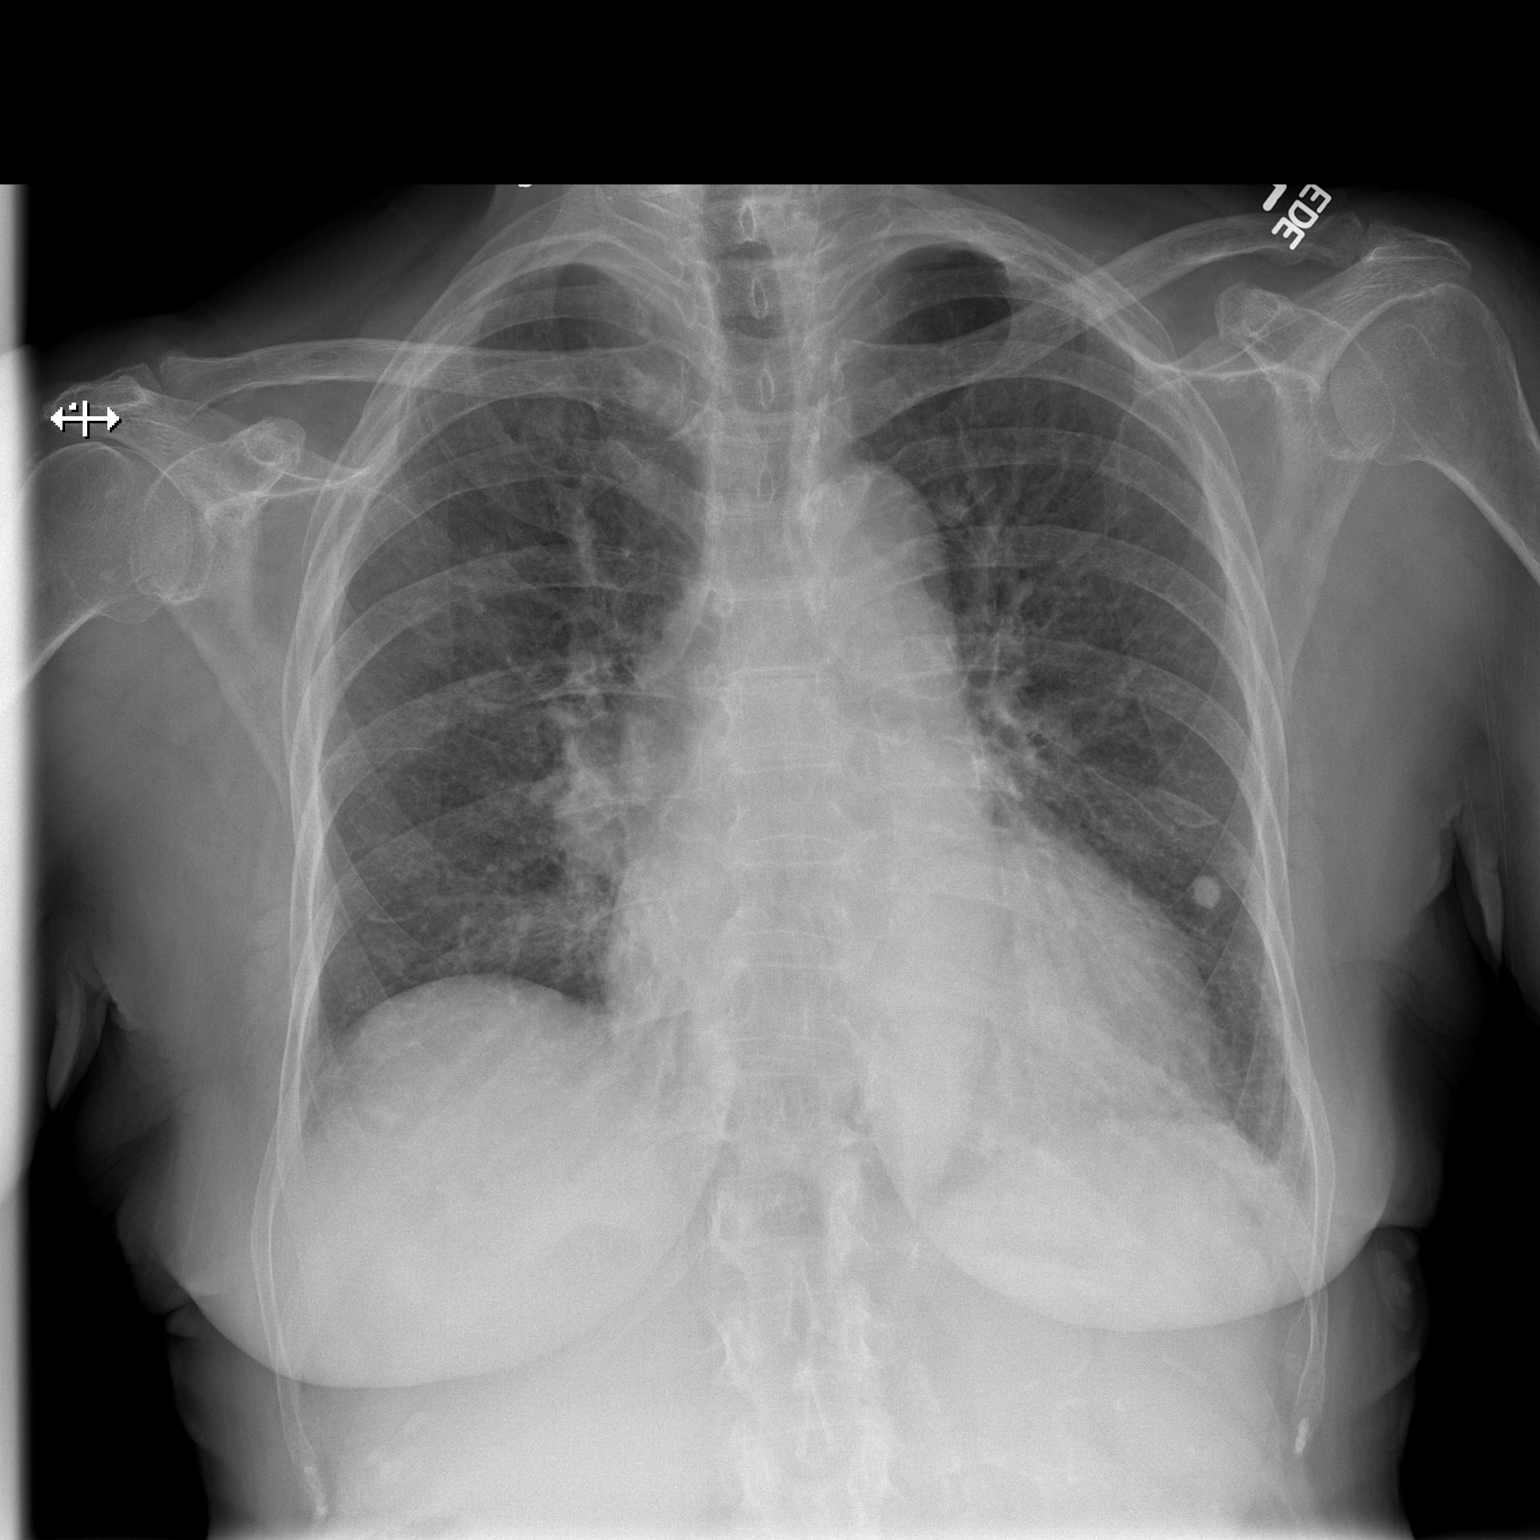

[w chest lat]
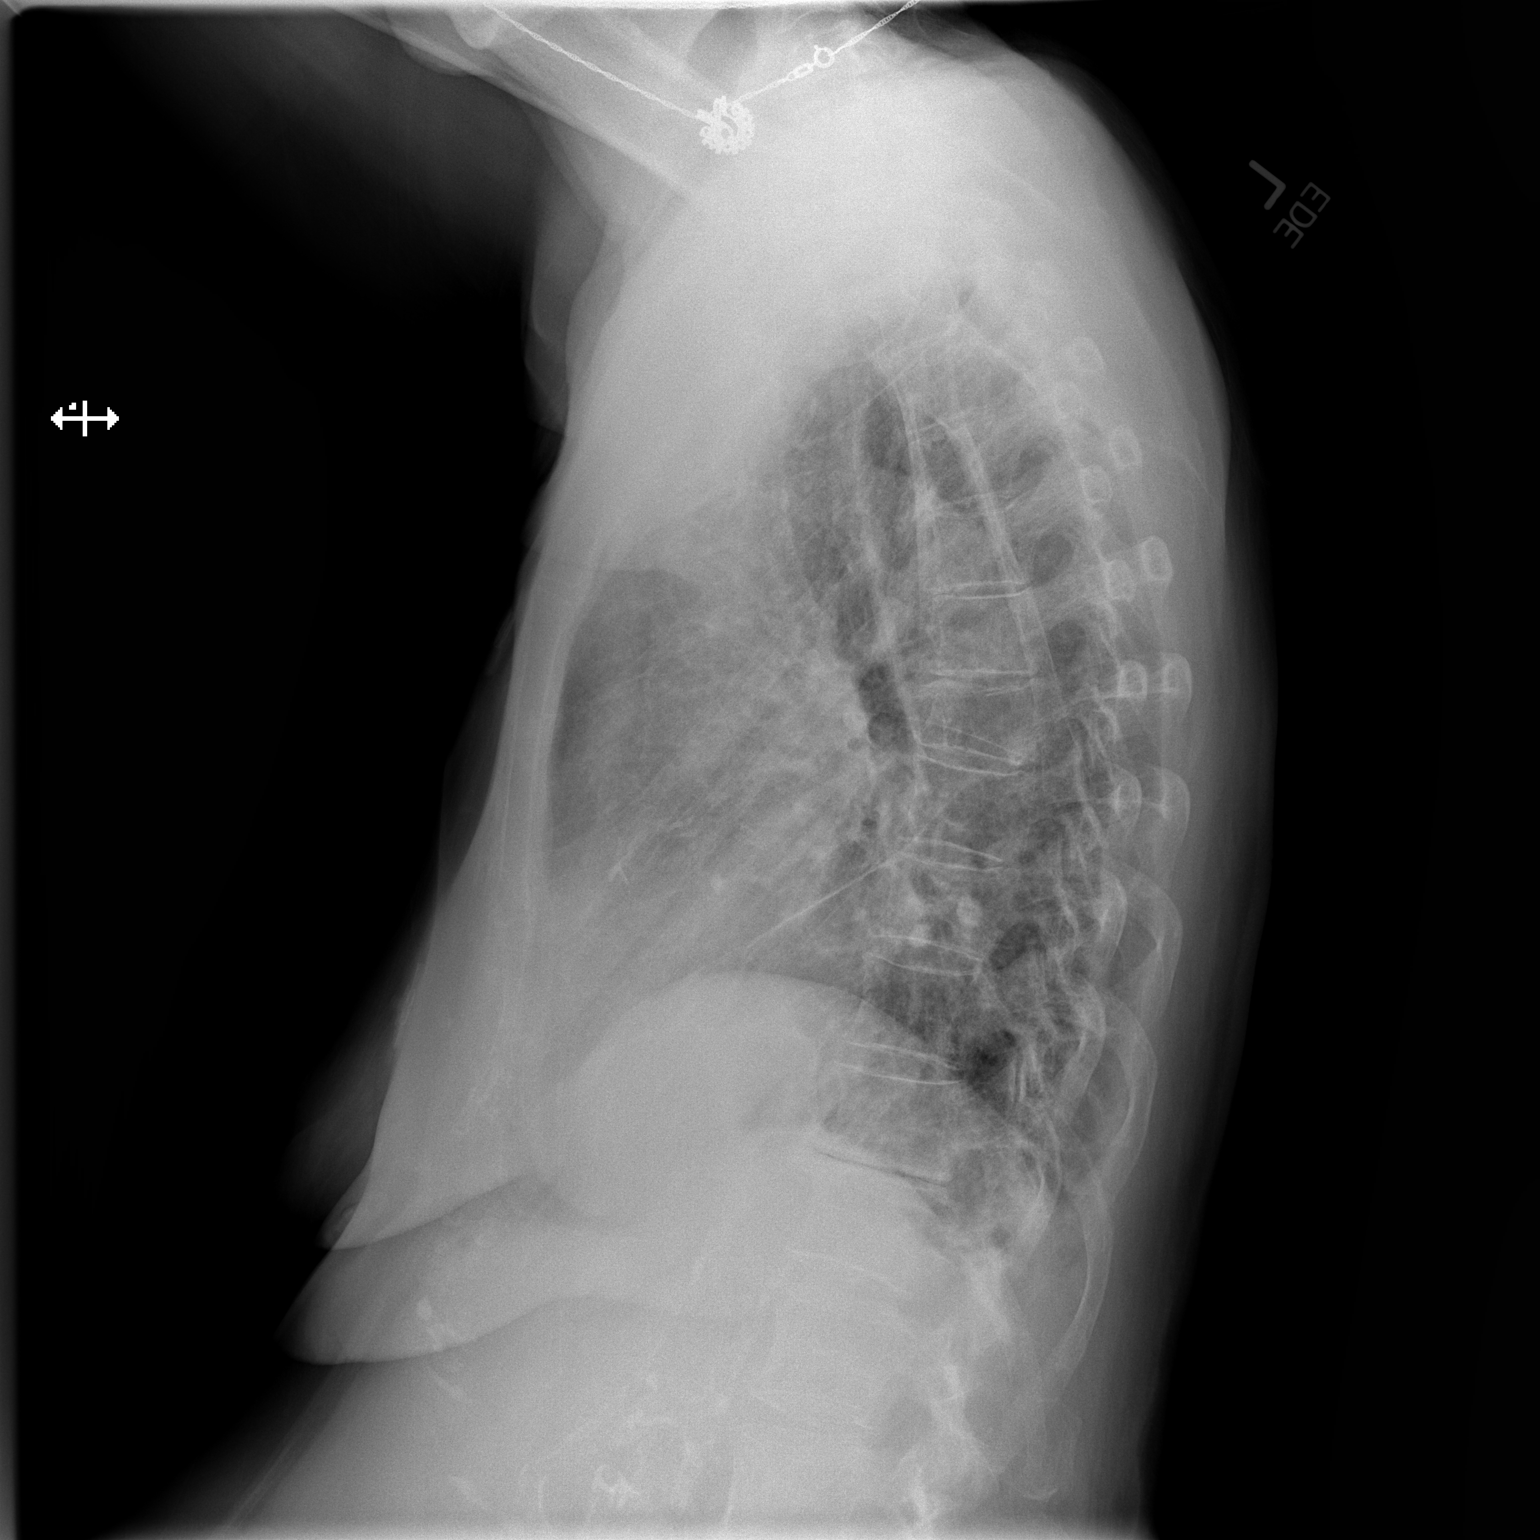

[2 of 2 positions shown; findings below may reference images not displayed]

FINDINGS: There is cardiomegaly but no edema. No focal airspace
disease or effusion.  Left lower lobe calcified granuloma is
identified.  Remote compression fracture deformity mid thoracic
spine is identified.
IMPRESSION: No acute finding.  Cardiomegaly.

## 2013-02-07 ENCOUNTER — Ambulatory Visit (INDEPENDENT_AMBULATORY_CARE_PROVIDER_SITE_OTHER): Payer: Medicare Other | Admitting: Cardiology

## 2013-02-07 ENCOUNTER — Encounter: Payer: Self-pay | Admitting: Cardiology

## 2013-02-07 VITALS — BP 132/75 | HR 75 | Ht 62.0 in | Wt 189.0 lb

## 2013-02-07 DIAGNOSIS — I4891 Unspecified atrial fibrillation: Secondary | ICD-10-CM

## 2013-02-07 DIAGNOSIS — I5032 Chronic diastolic (congestive) heart failure: Secondary | ICD-10-CM

## 2013-02-07 DIAGNOSIS — I509 Heart failure, unspecified: Secondary | ICD-10-CM

## 2013-02-07 NOTE — Patient Instructions (Signed)
STOP PLAVIX  FOLLOW UP WITH DR. HOCHREIN IN 6 MONTHS

## 2013-02-07 NOTE — Progress Notes (Signed)
HPI The patient presents for  followup after multiple episodes of heart failure and atrial fibrillation.  Since I last saw her she has had no further hospitalizations.She has had no acute shortness of breath and denies any PND or orthopnea.  She says that fatigue continues to be her biggest complaint. She denies any ongoing palpitations, presyncope or syncope. She has had a little weight gain which she thinks it's slow and because she's eating more. She is watching her salt.  Allergies  Allergen Reactions  . Ace Inhibitors Cough    Enalapril  benicor    . Crestor [Rosuvastatin Calcium] Other (See Comments)    Fatigue and leg pain   . Lipitor [Atorvastatin Calcium]     Myalgias   . Niaspan [Niacin] Other (See Comments)    unknown  . Olmesartan Cough  . Prednisone     REACTION: "retained fluid"    Current Outpatient Prescriptions  Medication Sig Dispense Refill  . amLODipine (NORVASC) 5 MG tablet Take 5 mg by mouth daily.      . clopidogrel (PLAVIX) 75 MG tablet Take 75 mg by mouth daily.      . Cyanocobalamin (VITAMIN B-12 IJ) Inject 1 application as directed every 30 (thirty) days. Given at the doctor's office at the end of each month      . Fe Fum-FA-B Cmp-C-Zn-Mg-Mn-Cu (HEMATINIC PLUS VIT/MINERALS) 106-1 MG TABS TAKE  ONE TABLET BY MOUTH EVERY MORNING  30 each  5  . fish oil-omega-3 fatty acids 1000 MG capsule Take 1 g by mouth at bedtime.       . furosemide (LASIX) 80 MG tablet Take 1 tablet (80 mg total) by mouth 2 (two) times daily.  60 tablet  10  . insulin aspart (NOVOLOG FLEXPEN) 100 UNIT/ML injection Inject 10 Units into the skin 3 (three) times daily before meals. Inject 10 units before breakfast and lunch and 8 units before supper  1 vial    . insulin detemir (LEVEMIR) 100 UNIT/ML injection Inject 28 Units into the skin every morning.      . isosorbide mononitrate (IMDUR) 60 MG 24 hr tablet Take 60 mg by mouth daily.      Marland Kitchen latanoprost (XALATAN) 0.005 % ophthalmic  solution Place 1 drop into both eyes at bedtime.       Marland Kitchen levothyroxine (SYNTHROID, LEVOTHROID) 50 MCG tablet TAKE ONE TABLET BY MOUTH EVERY MORNING ON EMPTY STOMACH  30 tablet  9  . magnesium oxide (MAG-OX) 400 MG tablet Take 400 mg by mouth 4 (four) times daily.      . meclizine (ANTIVERT) 12.5 MG tablet Take 1 tablet (12.5 mg total) by mouth 2 (two) times daily as needed for dizziness or nausea.  30 tablet  0  . metFORMIN (GLUCOPHAGE) 1000 MG tablet Take 1 tablet (1,000 mg total) by mouth 2 (two) times daily with a meal.  60 tablet  5  . metoprolol (LOPRESSOR) 50 MG tablet Take 50 mg by mouth 2 (two) times daily.      . nitroGLYCERIN (NITROSTAT) 0.4 MG SL tablet Place 0.4 mg under the tongue every 5 (five) minutes as needed. As needed for chest pain      . Pitavastatin Calcium (LIVALO) 2 MG TABS Take 1 tablet (2 mg total) by mouth daily. For cholesterol  30 tablet  2  . potassium chloride SA (K-DUR,KLOR-CON) 20 MEQ tablet Take 1 tablet (20 mEq total) by mouth 2 (two) times daily.  60 tablet  5  . sotalol (  BETAPACE) 80 MG tablet Take 1 tablet (80 mg total) by mouth 2 (two) times daily.  60 tablet  11  . warfarin (COUMADIN) 5 MG tablet TAKE 1 OR 2 TABLETS BY MOUTH DAILY AS DIRECTED BY ANITCOAGULATIONCLINIC  60 tablet  2  . [DISCONTINUED] diltiazem (TIAZAC) 420 MG 24 hr capsule Take 420 mg by mouth daily.       Current Facility-Administered Medications  Medication Dose Route Frequency Provider Last Rate Last Dose  . cyanocobalamin ((VITAMIN B-12)) injection 1,000 mcg  1,000 mcg Intramuscular Q30 days Henrene Pastor, PHARMD   1,000 mcg at 01/25/13 1241    Past Medical History  Diagnosis Date  . Hypertension     with h/o hypertensive urgency  . Glaucoma   . Tremor, essential   . Diabetes mellitus with neuropathy   . B12 deficiency   . Hyperlipidemia   . Atrial fibrillation     a. on chronic anticoagulation with warfarin  b. Tiksosyn discontinued and amiodarone  was discontinued secondary to  perceived symptoms  . Chronic diastolic heart failure     a. echo 05/2009: mild LVH, EF 55-60%, grade 2 diast dysfxn, mild LAE, PASP;   b. Echo 3/13:   mod LVH, EF 55-60%, mild to mod calc AV annulus, very mild AS (mean 9 mmHg)  . CAD (coronary artery disease)     a.  b. 07/2011 NSTEMI - PCI/DES LCX - Resolute stent;;   b. LHC 12/27/11: mLAD 30%, prox-mid D1 90% (small);  CFX patent stent, OM3 small 95%,, mRCA 30%, EF 65%  -  med Rx;   . Carotid stenosis     a. dopplers 6/12: 1-39% bilat ICA; b. carotid dopplers 7/13: bilat 1-39% ICA  . Pulmonary fibrosis     Dr. Vassie Loll;  patient taken off O2 in 8/12; dyspnea felt CHF>>ILD  . Skin cancer   . Colon polyps   . Syncope     unknown etiology  . Bradycardia   . Pacemaker 02/29/2012  . Diabetes mellitus     insulin controled  . Elevated TSH 05/2012  . Normocytic anemia 05/2012  . CHF (congestive heart failure)     Past Surgical History  Procedure Laterality Date  . Appendectomy    . Nose surgery    . Colonoscopy    . Cardiac catheterization  2013    stent  . Coronary angioplasty with stent placement    . Insert / replace / remove pacemaker  02/29/2012  . Dilation and curettage of uterus     ROS:  Hair loss.  Otherwise as stated in the HPI and negative for all other systems.  PHYSICAL EXAM BP 132/75  Pulse 75  Ht 5\' 2"  (1.575 m)  Wt 189 lb (85.73 kg)  BMI 34.56 kg/m2 GENERAL:  Well appearing NECK:  No jugular venous distention, waveform within normal limits, carotid upstroke brisk and symmetric, left bruit, no thyromegaly LYMPHATICS:  No cervical, inguinal adenopathy LUNGS:  Bilateral basilar crackles as evidence on previous exams. CHEST:  Well healed pacemaker pocket HEART:  PMI not displaced or sustained,S1 and S2 within normal limits, no S3, no S4, no clicks, no rubs, early peaking apical systolic murmur, no diastolic murmurs murmurs ABD:  Flat, positive bowel sounds normal in frequency in pitch, mid bruits, no rebound, no  guarding, no midline pulsatile mass, no hepatomegaly, no splenomegaly EXT:  2 plus pulses throughout, mild ankle edema, no cyanosis no clubbing NEURO:  Cranial nerves II through XII grossly intact, motor grossly intact  throughout, resting tremor  EKG:  Atrial paced rhythm, rate 80, axis within normal limits, old inferior infarct, poor anterior R wave progression, no ST-T wave changes.  02/07/2013  ASSESSMENT AND PLAN  FIBRILLATION, ATRIAL -  She seems to be maintaining sinus rhythm on the Sotalol.  No change in therapy is indicated.  HYPERTENSION -  Her blood pressure is controlled today. No change in therapy is indicated.  DIASTOLIC DYSFUNCTION -  She finally seems to be paying more attention to fluid restriction and salt restriction and I think this is keeping her out of the hospital. No change in therapy is indicated. We again reviewed when necessary dosing of her diuretic based on daily weights.  CAD -  She had nonobstructive disease and small vessel disease at the last cath in May 2013.  She can stop her Plavix.

## 2013-02-14 ENCOUNTER — Encounter (INDEPENDENT_AMBULATORY_CARE_PROVIDER_SITE_OTHER): Payer: Medicare Other | Admitting: Ophthalmology

## 2013-02-14 DIAGNOSIS — E1139 Type 2 diabetes mellitus with other diabetic ophthalmic complication: Secondary | ICD-10-CM

## 2013-02-14 DIAGNOSIS — E11311 Type 2 diabetes mellitus with unspecified diabetic retinopathy with macular edema: Secondary | ICD-10-CM

## 2013-02-14 DIAGNOSIS — E11359 Type 2 diabetes mellitus with proliferative diabetic retinopathy without macular edema: Secondary | ICD-10-CM

## 2013-02-14 DIAGNOSIS — H35039 Hypertensive retinopathy, unspecified eye: Secondary | ICD-10-CM

## 2013-02-14 DIAGNOSIS — I10 Essential (primary) hypertension: Secondary | ICD-10-CM

## 2013-02-20 ENCOUNTER — Other Ambulatory Visit (HOSPITAL_COMMUNITY): Payer: Self-pay | Admitting: Cardiology

## 2013-02-22 ENCOUNTER — Ambulatory Visit (INDEPENDENT_AMBULATORY_CARE_PROVIDER_SITE_OTHER): Payer: Medicare Other | Admitting: Pharmacist

## 2013-02-22 ENCOUNTER — Encounter: Payer: Self-pay | Admitting: Pharmacist

## 2013-02-22 VITALS — BP 133/72 | HR 74

## 2013-02-22 DIAGNOSIS — I4891 Unspecified atrial fibrillation: Secondary | ICD-10-CM

## 2013-02-22 DIAGNOSIS — I2510 Atherosclerotic heart disease of native coronary artery without angina pectoris: Secondary | ICD-10-CM

## 2013-02-22 DIAGNOSIS — E785 Hyperlipidemia, unspecified: Secondary | ICD-10-CM

## 2013-02-22 DIAGNOSIS — E119 Type 2 diabetes mellitus without complications: Secondary | ICD-10-CM

## 2013-02-22 LAB — POCT INR: INR: 4.6

## 2013-02-22 NOTE — Progress Notes (Signed)
Diabetes Follow-Up Visit Chief Complaint:   Chief Complaint  Patient presents with  . Diabetes  . Atrial Fibrillation    recheck protime     Filed Vitals:   02/22/13 1652  BP: 133/72  Pulse: 74     HPI:  Patient with uncontrolled DM.     Current Diabetes Medications:  Levemir 28 units each morning and Novolog 10 units with breakfast and lunch and 8 units with supper HBG readings (pt only check in am) - 104, 197, 255, 180, 236, 240, 184, 171, 256, 80, 260 Patient states she has had 2 hypoglycemic episodes lately - 1 last night but she states that she forgot Levemir in the morning and she took at 5pm.  Other event occurred at grocery store and she ate a chocolate bar and was OK.  She also admits to forgetting Novolog about 2-3 times a week. Mrs. Emminger also c/o increased weakness in legs since starting Livalo 2mg  daily  Exam Edema:  Trace  Polyuria:  negative  Polydipsia:  positive Polyphagia:  negative   General Appearance:  alert, oriented, no acute distress, well nourished and obese Mood/Affect:  normal   Low fat/carbohydrate diet?  No Nicotine Abuse?  No Medication Compliance?  No Exercise?  No Alcohol Abuse?  No  Home BG Monitoring:  Checking 1 times a day. Average:  200's  High: 281  Low:  50's    Lab Results  Component Value Date   HGBA1C 7.3% 12/05/2012    No results found for this basenameConcepcion Elk    Lab Results  Component Value Date   CHOL 186 01/22/2012   HDL 50 01/22/2012   LDLCALC 103* 12/05/2012   TRIG 227* 12/05/2012   CHOLHDL 3.7 01/22/2012      Assessment: 1.  Diabetes.  uncontrolled 2.  Blood Pressure.  At goal today 3.  Lipids.  Tg and LDL both slightly elevated. Possible ADR to Livalo 4.  A. Fib / chronic anticoagulation - supra therapeutic due to starting cranberry capsules  Recommendations: 1.  Medication recommendations at this time are as follows:  Dicussed ways to improve compliance with insulin regimen.   I  instructed her that if she forgets long acting insulin (Levemir) and doesn't remember until after 4pm that she should only take 1/2 her usual dose and then restart regular dose the next morning.  This should help prevent hypoglycemia over night. 2.  Reviewed HBG goals:  Fasting 80-130 and 1-2 hour post prandial <180.  Patient is instructed to check BG 2 times per day.  Need to know pm BG reading before I adjust insulin regimen.   3.  BP goal < 140/80. 4.  LDL goal of < 100, HDL > 40 and TG < 150. - hold Livalo for next 1-2 weeks, see if weakness improves. 5.  Eye Exam yearly and Dental Exam every 6 months. 6.  Dietary recommendations:  Reviewed how to treat hypoglycemia and best options to increase BG quickly (fruit juice, regular soda, hard candy) 7.   Anticoagulation Dose Instructions as of 02/22/2013     [name] Tue Wed Thu Fri Sat   New Dose 7.5 mg 10 mg 7.5 mg 7.5 mg 7.5 mg 10 mg 7.5 mg    Description       No warfarin for 2 days then decrease to 1 and 1/2 tablets daily except 2 tablets on mondays and fridays       8.  Return to clinic in 1 week

## 2013-02-22 NOTE — Patient Instructions (Signed)
Anticoagulation Dose Instructions as of 02/22/2013     [name] Tue Wed Thu Fri Sat   New Dose 7.5 mg 10 mg 7.5 mg 7.5 mg 7.5 mg 10 mg 7.5 mg    Description       No warfarin for 2 days then decrease to 1 and 1/2 tablets daily except 2 tablets on mondays and fridays      INR was 4.6 today

## 2013-02-28 ENCOUNTER — Ambulatory Visit: Payer: Self-pay

## 2013-03-01 ENCOUNTER — Ambulatory Visit (INDEPENDENT_AMBULATORY_CARE_PROVIDER_SITE_OTHER): Payer: Medicare Other | Admitting: Pharmacist

## 2013-03-01 VITALS — BP 133/70 | HR 78 | Ht 62.0 in | Wt 184.0 lb

## 2013-03-01 DIAGNOSIS — I4891 Unspecified atrial fibrillation: Secondary | ICD-10-CM

## 2013-03-01 DIAGNOSIS — E119 Type 2 diabetes mellitus without complications: Secondary | ICD-10-CM

## 2013-03-01 LAB — POCT INR: INR: 2.1

## 2013-03-01 NOTE — Progress Notes (Signed)
Diabetes Follow-Up Visit Chief Complaint:   Chief Complaint  Patient presents with  . Diabetes  . Anticoagulation     Filed Vitals:   03/01/13 1306  BP: 133/70  Pulse: 78    Filed Weights   03/01/13 1306  Weight: 184 lb (83.462 kg)    HPI:  Patient with uncontrolled DM.     Current Diabetes Medications:  Levemir 28 units each morning and Novolog 10 units with breakfast and lunch and 8 units with supper HBG readings ( in am) - 164, 112, 198, 91, 198, 246, 261   (in pm ) - 142, 296, 162, 230  Exam Edema:  Trace  Polyuria:  negative  Polydipsia:  positive Polyphagia:  negative   General Appearance:  alert, oriented, no acute distress, well nourished and obese Mood/Affect:  normal   Low fat/carbohydrate diet?  No Nicotine Abuse?  No Medication Compliance?  No Exercise?  No Alcohol Abuse?  No  Home BG Monitoring:  Checking 1 times a day. Average:  197  High: 296  Low:  91    Lab Results  Component Value Date   HGBA1C 7.3% 12/05/2012    No results found for this basename: Concepcion Elk    Lab Results  Component Value Date   CHOL 186 01/22/2012   HDL 50 01/22/2012   LDLCALC 103* 12/05/2012   TRIG 227* 12/05/2012   CHOLHDL 3.7 01/22/2012      Assessment: 1.  Diabetes.  uncontrolled 2.  Blood Pressure.  At goal today 3.  Lipids.  Tg and LDL both slightly elevated. Possible ADR to Livalo 4.  A. Fib / chronic anticoagulation - therapeutic  Recommendations: 1.  Medication recommendations at this time are as follows:    Continue Levemir 28 units each morning   Increase Novolog to 12 units with breakfast and lunch and continue 8 units with supper.   2.  Reviewed HBG goals:  Fasting 80-130 and 1-2 hour post prandial <180.  Patient is instructed to check BG 2 times per day.  Need to know pm BG reading before I adjust insulin regimen.   3.  BP goal < 140/80. 4.  LDL goal of < 100, HDL > 40 and TG < 150. - Continue to hold Livalo for next 1-2 weeks, see if  weakness improves. 5.  Eye Exam yearly and Dental Exam every 6 months. 6.  Dietary recommendations:  Reviewed how to treat hypoglycemia and best options to increase BG quickly (fruit juice, regular soda, hard candy) 7.   Anticoagulation Dose Instructions as of 03/01/2013     [name] Tue Wed Thu Fri Sat   New Dose 7.5 mg 10 mg 7.5 mg 7.5 mg 7.5 mg 10 mg 7.5 mg    Description       Continue 1 and 1/2 tablets daily except 2 tablets on mondays and fridays       8.  Return to clinic in 3 weeks, see PCP Dr Modesto Charon next week

## 2013-03-01 NOTE — Patient Instructions (Addendum)
Anticoagulation Dose Instructions as of 03/01/2013     Alexandria [name] Tue Wed Thu Fri Sat   New Dose 7.5 mg 10 mg 7.5 mg 7.5 mg 7.5 mg 10 mg 7.5 mg    Description       Continue 1 and 1/2 tablets daily except 2 tablets on mondays and fridays      INR was 2.1 today

## 2013-03-08 ENCOUNTER — Encounter: Payer: Self-pay | Admitting: Family Medicine

## 2013-03-08 ENCOUNTER — Ambulatory Visit (INDEPENDENT_AMBULATORY_CARE_PROVIDER_SITE_OTHER): Payer: Medicare Other | Admitting: Family Medicine

## 2013-03-08 VITALS — BP 136/74 | HR 100 | Temp 98.2°F | Ht 63.0 in | Wt 185.4 lb

## 2013-03-08 DIAGNOSIS — R0989 Other specified symptoms and signs involving the circulatory and respiratory systems: Secondary | ICD-10-CM

## 2013-03-08 DIAGNOSIS — R350 Frequency of micturition: Secondary | ICD-10-CM

## 2013-03-08 DIAGNOSIS — R5381 Other malaise: Secondary | ICD-10-CM

## 2013-03-08 DIAGNOSIS — Z860100 Personal history of colon polyps, unspecified: Secondary | ICD-10-CM

## 2013-03-08 DIAGNOSIS — I5043 Acute on chronic combined systolic (congestive) and diastolic (congestive) heart failure: Secondary | ICD-10-CM

## 2013-03-08 DIAGNOSIS — I509 Heart failure, unspecified: Secondary | ICD-10-CM

## 2013-03-08 DIAGNOSIS — IMO0001 Reserved for inherently not codable concepts without codable children: Secondary | ICD-10-CM

## 2013-03-08 DIAGNOSIS — E669 Obesity, unspecified: Secondary | ICD-10-CM

## 2013-03-08 DIAGNOSIS — Z7901 Long term (current) use of anticoagulants: Secondary | ICD-10-CM

## 2013-03-08 DIAGNOSIS — I5032 Chronic diastolic (congestive) heart failure: Secondary | ICD-10-CM

## 2013-03-08 DIAGNOSIS — I2510 Atherosclerotic heart disease of native coronary artery without angina pectoris: Secondary | ICD-10-CM

## 2013-03-08 DIAGNOSIS — I6529 Occlusion and stenosis of unspecified carotid artery: Secondary | ICD-10-CM

## 2013-03-08 DIAGNOSIS — I4891 Unspecified atrial fibrillation: Secondary | ICD-10-CM

## 2013-03-08 DIAGNOSIS — R0602 Shortness of breath: Secondary | ICD-10-CM

## 2013-03-08 DIAGNOSIS — I10 Essential (primary) hypertension: Secondary | ICD-10-CM

## 2013-03-08 DIAGNOSIS — E039 Hypothyroidism, unspecified: Secondary | ICD-10-CM

## 2013-03-08 DIAGNOSIS — G4730 Sleep apnea, unspecified: Secondary | ICD-10-CM

## 2013-03-08 DIAGNOSIS — R531 Weakness: Secondary | ICD-10-CM

## 2013-03-08 DIAGNOSIS — E538 Deficiency of other specified B group vitamins: Secondary | ICD-10-CM

## 2013-03-08 DIAGNOSIS — E785 Hyperlipidemia, unspecified: Secondary | ICD-10-CM

## 2013-03-08 DIAGNOSIS — N390 Urinary tract infection, site not specified: Secondary | ICD-10-CM

## 2013-03-08 DIAGNOSIS — E119 Type 2 diabetes mellitus without complications: Secondary | ICD-10-CM

## 2013-03-08 LAB — POCT UA - MICROSCOPIC ONLY
Bacteria, U Microscopic: NEGATIVE
Casts, Ur, LPF, POC: NEGATIVE
Crystals, Ur, HPF, POC: NEGATIVE
Mucus, UA: NEGATIVE
Yeast, UA: NEGATIVE

## 2013-03-08 LAB — POCT URINALYSIS DIPSTICK
Bilirubin, UA: NEGATIVE
Glucose, UA: NEGATIVE
Ketones, UA: NEGATIVE
Nitrite, UA: NEGATIVE
Protein, UA: NEGATIVE
Spec Grav, UA: <=1.005
Urobilinogen, UA: NEGATIVE
pH, UA: 7.5

## 2013-03-08 LAB — POCT GLYCOSYLATED HEMOGLOBIN (HGB A1C): Hemoglobin A1C: 7.4

## 2013-03-08 MED ORDER — INSULIN DETEMIR 100 UNIT/ML ~~LOC~~ SOLN: 28.0000 [IU] | Freq: Every morning | SUBCUTANEOUS | Status: DC

## 2013-03-08 MED ORDER — CEPHALEXIN 500 MG PO CAPS: 500.0000 mg | ORAL_CAPSULE | Freq: Three times a day (TID) | ORAL | Status: DC

## 2013-03-08 NOTE — Progress Notes (Signed)
Patient ID: Alexandria [name], female   DOB: [birth year], 79 y.o.   MRN: [id number] SUBJECTIVE: CC: Chief Complaint  Patient presents with  . Urinary Tract Infection    frequency burrning and itching  . Follow-up    3 month   . Medication Refill    refill levimir wants 5 pens     HPI: Patient is here for follow up of Diabetes Mellitus/a fib/HTN/HLD/CAD/CHF: Symptoms evaluated: Denies Nocturia ,Denies Urinary Frequency , denies Blurred vision ,deniesDizziness,denies.Dysuria,denies paresthesias, denies extremity pain or ulcers.Marland Kitchendenies chest pain. has had an annual eye exam. do check the feet. Does check CBGs. Average CBG:bad Denies episodes of hypoglycemia. Does have an emergency hypoglycemic plan. admits toCompliance with medications. Denies Problems with medications.  Has dyspnea and hypoxia at nights and has had Oxygen for years.  Dysuria: cipro usually works. Last UTI: cannot remember.  Past Medical History  Diagnosis Date  . Hypertension     with h/o hypertensive urgency  . Glaucoma   . Tremor, essential   . Diabetes mellitus with neuropathy   . B12 deficiency   . Hyperlipidemia   . Atrial fibrillation     a. on chronic anticoagulation with warfarin  b. Tiksosyn discontinued and amiodarone  was discontinued secondary to perceived symptoms  . Chronic diastolic heart failure     a. echo 05/2009: mild LVH, EF 55-60%, grade 2 diast dysfxn, mild LAE, PASP;   b. Echo 3/13:   mod LVH, EF 55-60%, mild to mod calc AV annulus, very mild AS (mean 9 mmHg)  . CAD (coronary artery disease)     a.  b. 07/2011 NSTEMI - PCI/DES LCX - Resolute stent;;   b. LHC 12/27/11: mLAD 30%, prox-mid D1 90% (small);  CFX patent stent, OM3 small 95%,, mRCA 30%, EF 65%  -  med Rx;   . Carotid stenosis     a. dopplers 6/12: 1-39% bilat ICA; b. carotid dopplers 7/13: bilat 1-39% ICA  . Pulmonary fibrosis     Dr. Vassie Loll;  patient taken off O2 in 8/12; dyspnea felt CHF>>ILD  . Skin cancer   . Colon polyps   .  Syncope     unknown etiology  . Bradycardia   . Pacemaker 02/29/2012  . Diabetes mellitus     insulin controled  . Elevated TSH 05/2012  . Normocytic anemia 05/2012  . CHF (congestive heart failure)    Past Surgical History  Procedure Laterality Date  . Appendectomy    . Nose surgery    . Colonoscopy    . Cardiac catheterization  2013    stent  . Coronary angioplasty with stent placement    . Insert / replace / remove pacemaker  02/29/2012  . Dilation and curettage of uterus     History   Social History  . Marital Status: Widowed    Spouse Name: N/A    Number of Children: 1  . Years of Education: N/A   Occupational History  . retired    Social History Main Topics  . Smoking status: Never Smoker   . Smokeless tobacco: Never Used  . Alcohol Use: No  . Drug Use: No  . Sexual Activity: No   Other Topics Concern  . Not on file   Social History Narrative   Widow, lives alone at home.   Family History  Problem Relation Age of Onset  . Throat cancer Brother   . Prostate cancer Father   . Diabetes Mother   . Diabetes Brother  multiple  . Heart disease Brother   . Colon cancer Neg Hx    Current Outpatient Prescriptions on File Prior to Visit  Medication Sig Dispense Refill  . amLODipine (NORVASC) 5 MG tablet Take 5 mg by mouth daily.      . Cranberry (SM CRANBERRY) 300 MG tablet Take 300 mg by mouth daily.      . Fe Fum-FA-B Cmp-C-Zn-Mg-Mn-Cu (HEMATINIC PLUS VIT/MINERALS) 106-1 MG TABS TAKE  ONE TABLET BY MOUTH EVERY MORNING  30 each  5  . fish oil-omega-3 fatty acids 1000 MG capsule Take 1 g by mouth at bedtime.       . furosemide (LASIX) 80 MG tablet Take 1 tablet (80 mg total) by mouth 2 (two) times daily.  60 tablet  10  . insulin aspart (NOVOLOG FLEXPEN) 100 UNIT/ML injection Inject 10 Units into the skin 3 (three) times daily before meals. Inject 10 units before breakfast and lunch and 8 units before supper  1 vial    . insulin detemir (LEVEMIR) 100 UNIT/ML  injection Inject 28 Units into the skin every morning.      . isosorbide mononitrate (IMDUR) 60 MG 24 hr tablet Take 60 mg by mouth daily.      Marland Kitchen latanoprost (XALATAN) 0.005 % ophthalmic solution Place 1 drop into both eyes at bedtime.       Marland Kitchen levothyroxine (SYNTHROID, LEVOTHROID) 50 MCG tablet TAKE ONE TABLET BY MOUTH EVERY MORNING ON EMPTY STOMACH  30 tablet  9  . magnesium oxide (MAG-OX) 400 MG tablet Take 400 mg by mouth 4 (four) times daily.      . meclizine (ANTIVERT) 12.5 MG tablet Take 1 tablet (12.5 mg total) by mouth 2 (two) times daily as needed for dizziness or nausea.  30 tablet  0  . metFORMIN (GLUCOPHAGE) 1000 MG tablet Take 1 tablet (1,000 mg total) by mouth 2 (two) times daily with a meal.  60 tablet  5  . metoprolol (LOPRESSOR) 50 MG tablet TAKE ONE TABLET BY MOUTH TWICE DAILY  60 tablet  5  . nitroGLYCERIN (NITROSTAT) 0.4 MG SL tablet Place 0.4 mg under the tongue every 5 (five) minutes as needed. As needed for chest pain      . Pitavastatin Calcium (LIVALO) 2 MG TABS Take 1 tablet (2 mg total) by mouth daily. For cholesterol  30 tablet  2  . potassium chloride SA (K-DUR,KLOR-CON) 20 MEQ tablet Take 1 tablet (20 mEq total) by mouth 2 (two) times daily.  60 tablet  5  . sotalol (BETAPACE) 80 MG tablet Take 1 tablet (80 mg total) by mouth 2 (two) times daily.  60 tablet  11  . warfarin (COUMADIN) 5 MG tablet TAKE 1 OR 2 TABLETS BY MOUTH DAILY AS DIRECTED BY ANITCOAGULATIONCLINIC  60 tablet  2  . [DISCONTINUED] diltiazem (TIAZAC) 420 MG 24 hr capsule Take 420 mg by mouth daily.       Current Facility-Administered Medications on File Prior to Visit  Medication Dose Route Frequency Provider Last Rate Last Dose  . cyanocobalamin ((VITAMIN B-12)) injection 1,000 mcg  1,000 mcg Intramuscular Q30 days Tammy Eckard, PHARMD   1,000 mcg at 01/25/13 1241   Allergies  Allergen Reactions  . Ace Inhibitors Cough    Enalapril  benicor    . Crestor [Rosuvastatin Calcium] Other (See  Comments)    Fatigue and leg pain   . Lipitor [Atorvastatin Calcium]     Myalgias   . Niaspan [Niacin] Other (See Comments)  unknown  . Olmesartan Cough  . Prednisone     REACTION: "retained fluid"   Immunization History  Administered Date(s) Administered  . Influenza Whole 04/07/2011  . Pneumococcal Polysaccharide 03/01/2012   Prior to Admission medications   Medication Sig Start Date End Date Taking? Authorizing Provider  amLODipine (NORVASC) 5 MG tablet Take 5 mg by mouth daily.    Historical Provider, MD  Cranberry (SM CRANBERRY) 300 MG tablet Take 300 mg by mouth daily.    Historical Provider, MD  Fe Fum-FA-B Cmp-C-Zn-Mg-Mn-Cu (HEMATINIC PLUS VIT/MINERALS) 106-1 MG TABS TAKE  ONE TABLET BY MOUTH EVERY MORNING 12/13/12   Ileana Ladd, MD  fish oil-omega-3 fatty acids 1000 MG capsule Take 1 g by mouth at bedtime.     Historical Provider, MD  furosemide (LASIX) 80 MG tablet Take 1 tablet (80 mg total) by mouth 2 (two) times daily. 06/28/12   Jodelle Gross, NP  insulin aspart (NOVOLOG FLEXPEN) 100 UNIT/ML injection Inject 10 Units into the skin 3 (three) times daily before meals. Inject 10 units before breakfast and lunch and 8 units before supper 01/25/13   Tammy Eckard, PHARMD  insulin detemir (LEVEMIR) 100 UNIT/ML injection Inject 28 Units into the skin every morning. 08/14/12   Henderson Cloud, MD  isosorbide mononitrate (IMDUR) 60 MG 24 hr tablet Take 60 mg by mouth daily.    Historical Provider, MD  latanoprost (XALATAN) 0.005 % ophthalmic solution Place 1 drop into both eyes at bedtime.     Historical Provider, MD  levothyroxine (SYNTHROID, LEVOTHROID) 50 MCG tablet TAKE ONE TABLET BY MOUTH EVERY MORNING ON EMPTY STOMACH 11/02/12   Ileana Ladd, MD  magnesium oxide (MAG-OX) 400 MG tablet Take 400 mg by mouth 4 (four) times daily.    Historical Provider, MD  meclizine (ANTIVERT) 12.5 MG tablet Take 1 tablet (12.5 mg total) by mouth 2 (two) times daily as needed for  dizziness or nausea. 08/14/12   Henderson Cloud, MD  metFORMIN (GLUCOPHAGE) 1000 MG tablet Take 1 tablet (1,000 mg total) by mouth 2 (two) times daily with a meal. 09/22/12   Ileana Ladd, MD  metoprolol (LOPRESSOR) 50 MG tablet TAKE ONE TABLET BY MOUTH TWICE DAILY 02/20/13   [name], MD  nitroGLYCERIN (NITROSTAT) 0.4 MG SL tablet Place 0.4 mg under the tongue every 5 (five) minutes as needed. As needed for chest pain    Historical Provider, MD  Pitavastatin Calcium (LIVALO) 2 MG TABS Take 1 tablet (2 mg total) by mouth daily. For cholesterol 12/25/12   Tammy Eckard, PHARMD  potassium chloride SA (K-DUR,KLOR-CON) 20 MEQ tablet Take 1 tablet (20 mEq total) by mouth 2 (two) times daily. 09/22/12   Ileana Ladd, MD  sotalol (BETAPACE) 80 MG tablet Take 1 tablet (80 mg total) by mouth 2 (two) times daily. 06/29/12   [name], MD  warfarin (COUMADIN) 5 MG tablet TAKE 1 OR 2 TABLETS BY MOUTH DAILY AS DIRECTED BY ANITCOAGULATIONCLINIC 01/15/13   Ileana Ladd, MD    ROS: As above in the HPI. All other systems are stable or negative.  OBJECTIVE: APPEARANCE:  Patient in no acute distress.The patient appeared well nourished and normally developed. Acyanotic. Waist: VITAL SIGNS:BP 136/74  Pulse 100  Temp(Src) 98.2 F (36.8 C) (Oral)  Ht 5\' 3"  (1.6 m)  Wt 185 lb 6.4 oz (84.097 kg)  BMI 32.85 kg/m2  SpO2 94% WF obese elderly.  SKIN: warm and  Dry without overt rashes, tattoos  and scars  HEAD and Neck: without JVD, Head and scalp: normal Eyes:No scleral icterus. Fundi normal, eye movements normal. Ears: Auricle normal, canal normal, Tympanic membranes normal, insufflation normal. Nose: normal Throat: normal Neck & thyroid: normal  CHEST & LUNGS: Chest wall: normal Lungs: Clear  CVS: Reveals the PMI to be normally located. Regular rhythm, First and Second Heart sounds are normal,  absence of murmurs, rubs or gallops. Peripheral vasculature: Radial pulses:  normal Dorsal pedis pulses: normal Posterior pulses: normal  ABDOMEN:  Appearance: Obese. Benign, no organomegaly, no masses, no Abdominal Aortic enlargement. No Guarding , no rebound. No Bruits. Bowel sounds: normal  RECTAL: N/A GU: N/A  EXTREMETIES: nonedematous.  NEUROLOGIC: oriented to time,place and person; nonfocal. Essential tremors of the head. Generalized deconditioning and weakness.  Results for orders placed in visit on 03/08/13  POCT UA - MICROSCOPIC ONLY      Result Value Range   WBC, Ur, HPF, POC 75-100     RBC, urine, microscopic 10-15     Bacteria, U Microscopic neg     Mucus, UA neg     Epithelial cells, urine per micros occ     Crystals, Ur, HPF, POC neg     Casts, Ur, LPF, POC neg     Yeast, UA neg    POCT URINALYSIS DIPSTICK      Result Value Range   Color, UA yellow     Clarity, UA cloudy     Glucose, UA neg     Bilirubin, UA neg     Ketones, UA neg     Spec Grav, UA <=1.005     Blood, UA trace     pH, UA 7.5     Protein, UA neg     Urobilinogen, UA negative     Nitrite, UA neg     Leukocytes, UA large (3+)      ASSESSMENT: Frequency - Plan: POCT UA - Microscopic Only, POCT urinalysis dipstick, Urine culture  A-fib  Acute on chronic combined systolic and diastolic heart failure  Afib  Bruit  CAD  Chronic diastolic CHF (congestive heart failure)  COLONIC POLYPS, ADENOMATOUS, HX OF  DIABETES MELLITUS - Plan: insulin detemir (LEVEMIR) 100 UNIT/ML injection, POCT glycosylated hemoglobin (Hb A1C), CMP14+EGFR  HYPERTENSION - Plan: CMP14+EGFR  HYPERLIPIDEMIA - Plan: CMP14+EGFR, NMR, lipoprofile  DYSPNEA  OBESITY  OBSTRUCTIVE SLEEP APNEA  Occlusion and stenosis of carotid artery without mention of cerebral infarction, unspecified laterality  UTI (lower urinary tract infection)  Weakness generalized  Warfarin anticoagulation  Hypothyroid - Plan: TSH   PLAN: Orders Placed This Encounter  Procedures  . Urine culture   . CMP14+EGFR  . NMR, lipoprofile  . TSH  . POCT UA - Microscopic Only  . POCT urinalysis dipstick  . POCT glycosylated hemoglobin (Hb A1C)    Meds ordered this encounter  Medications  . insulin detemir (LEVEMIR) 100 UNIT/ML injection    Sig: Inject 0.28 mLs (28 Units total) into the skin every morning.    Dispense:  10 mL    Refill:  11  . cephALEXin (KEFLEX) 500 MG capsule    Sig: Take 1 capsule (500 mg total) by mouth 3 (three) times daily.    Dispense:  30 capsule    Refill:  0   Healthy diet , keeping active, Same medications.  Return in about 3 months (around 06/07/2013) for Recheck medical problems.  Matti Minney P. Modesto Charon, M.D.

## 2013-03-10 LAB — URINE CULTURE

## 2013-03-14 ENCOUNTER — Encounter (INDEPENDENT_AMBULATORY_CARE_PROVIDER_SITE_OTHER): Payer: Medicare Other | Admitting: Ophthalmology

## 2013-03-14 DIAGNOSIS — E11359 Type 2 diabetes mellitus with proliferative diabetic retinopathy without macular edema: Secondary | ICD-10-CM

## 2013-03-14 DIAGNOSIS — H43819 Vitreous degeneration, unspecified eye: Secondary | ICD-10-CM

## 2013-03-14 DIAGNOSIS — E1139 Type 2 diabetes mellitus with other diabetic ophthalmic complication: Secondary | ICD-10-CM

## 2013-03-14 DIAGNOSIS — H35039 Hypertensive retinopathy, unspecified eye: Secondary | ICD-10-CM

## 2013-03-14 DIAGNOSIS — I10 Essential (primary) hypertension: Secondary | ICD-10-CM

## 2013-03-14 DIAGNOSIS — E11311 Type 2 diabetes mellitus with unspecified diabetic retinopathy with macular edema: Secondary | ICD-10-CM

## 2013-03-15 LAB — CMP14+EGFR
ALT: 14 IU/L (ref 0–32)
AST: 17 IU/L (ref 0–40)
Albumin/Globulin Ratio: 1.4 (ref 1.1–2.5)
Albumin: 4.1 g/dL (ref 3.5–4.7)
Alkaline Phosphatase: 77 IU/L (ref 39–117)
BUN/Creatinine Ratio: 24 (ref 11–26)
BUN: 20 mg/dL (ref 8–27)
CO2: 30 mmol/L — ABNORMAL HIGH (ref 18–29)
Calcium: 10.9 mg/dL — ABNORMAL HIGH (ref 8.6–10.2)
Chloride: 96 mmol/L — ABNORMAL LOW (ref 97–108)
Creatinine, Ser: 0.85 mg/dL (ref 0.57–1.00)
GFR calc Af Amer: 75 mL/min/{1.73_m2} (ref >59)
GFR calc non Af Amer: 65 mL/min/{1.73_m2} (ref >59)
Globulin, Total: 3 g/dL (ref 1.5–4.5)
Glucose: 160 mg/dL — ABNORMAL HIGH (ref 65–99)
Potassium: 4.9 mmol/L (ref 3.5–5.2)
Sodium: 141 mmol/L (ref 134–144)
Total Bilirubin: 0.3 mg/dL (ref 0.0–1.2)
Total Protein: 7.1 g/dL (ref 6.0–8.5)

## 2013-03-15 LAB — NMR, LIPOPROFILE

## 2013-03-15 LAB — SPECIMEN STATUS REPORT

## 2013-03-15 LAB — TSH: TSH: 2.72 u[IU]/mL (ref 0.450–4.500)

## 2013-03-20 ENCOUNTER — Other Ambulatory Visit: Payer: Self-pay | Admitting: Family Medicine

## 2013-03-21 LAB — SPECIMEN STATUS REPORT

## 2013-03-21 LAB — LIPID PANEL WITH LDL/HDL RATIO
Cholesterol, Total: 189 mg/dL (ref 100–199)
HDL: 42 mg/dL (ref >39)
LDL Calculated: 100 mg/dL — ABNORMAL HIGH (ref 0–99)
LDl/HDL Ratio: 2.4 ratio units (ref 0.0–3.2)
Triglycerides: 233 mg/dL — ABNORMAL HIGH (ref 0–149)
VLDL Cholesterol Cal: 47 mg/dL — ABNORMAL HIGH (ref 5–40)

## 2013-03-23 ENCOUNTER — Other Ambulatory Visit: Payer: Self-pay | Admitting: Cardiology

## 2013-03-26 NOTE — Progress Notes (Signed)
PT NOTIFIED ABOUT LABS. VERBALIZED UNDERSTANDING.

## 2013-04-05 ENCOUNTER — Inpatient Hospital Stay (HOSPITAL_COMMUNITY)
Admission: EM | Admit: 2013-04-05 | Discharge: 2013-04-11 | DRG: 543 | Disposition: A | Discharge status: Skilled Nursing Facility | Payer: Medicare Other | Attending: Family Medicine | Admitting: Family Medicine

## 2013-04-05 ENCOUNTER — Emergency Department (HOSPITAL_COMMUNITY): Payer: Medicare Other

## 2013-04-05 ENCOUNTER — Ambulatory Visit (INDEPENDENT_AMBULATORY_CARE_PROVIDER_SITE_OTHER): Payer: Medicare Other | Admitting: Pharmacist

## 2013-04-05 ENCOUNTER — Encounter (HOSPITAL_COMMUNITY): Payer: Self-pay | Admitting: Emergency Medicine

## 2013-04-05 ENCOUNTER — Encounter (INDEPENDENT_AMBULATORY_CARE_PROVIDER_SITE_OTHER): Payer: Self-pay

## 2013-04-05 VITALS — BP 112/58 | HR 68

## 2013-04-05 DIAGNOSIS — S32010A Wedge compression fracture of first lumbar vertebra, initial encounter for closed fracture: Secondary | ICD-10-CM

## 2013-04-05 DIAGNOSIS — I509 Heart failure, unspecified: Secondary | ICD-10-CM | POA: Diagnosis present

## 2013-04-05 DIAGNOSIS — Z9981 Dependence on supplemental oxygen: Secondary | ICD-10-CM

## 2013-04-05 DIAGNOSIS — E785 Hyperlipidemia, unspecified: Secondary | ICD-10-CM | POA: Diagnosis present

## 2013-04-05 DIAGNOSIS — E119 Type 2 diabetes mellitus without complications: Secondary | ICD-10-CM

## 2013-04-05 DIAGNOSIS — M47817 Spondylosis without myelopathy or radiculopathy, lumbosacral region: Secondary | ICD-10-CM | POA: Diagnosis present

## 2013-04-05 DIAGNOSIS — Z9861 Coronary angioplasty status: Secondary | ICD-10-CM

## 2013-04-05 DIAGNOSIS — J8410 Pulmonary fibrosis, unspecified: Secondary | ICD-10-CM | POA: Diagnosis present

## 2013-04-05 DIAGNOSIS — Z23 Encounter for immunization: Secondary | ICD-10-CM

## 2013-04-05 DIAGNOSIS — Y92009 Unspecified place in unspecified non-institutional (private) residence as the place of occurrence of the external cause: Secondary | ICD-10-CM

## 2013-04-05 DIAGNOSIS — E1149 Type 2 diabetes mellitus with other diabetic neurological complication: Secondary | ICD-10-CM | POA: Diagnosis present

## 2013-04-05 DIAGNOSIS — I5043 Acute on chronic combined systolic (congestive) and diastolic (congestive) heart failure: Secondary | ICD-10-CM

## 2013-04-05 DIAGNOSIS — E1142 Type 2 diabetes mellitus with diabetic polyneuropathy: Secondary | ICD-10-CM | POA: Diagnosis present

## 2013-04-05 DIAGNOSIS — E039 Hypothyroidism, unspecified: Secondary | ICD-10-CM | POA: Diagnosis present

## 2013-04-05 DIAGNOSIS — I10 Essential (primary) hypertension: Secondary | ICD-10-CM | POA: Diagnosis present

## 2013-04-05 DIAGNOSIS — I5032 Chronic diastolic (congestive) heart failure: Secondary | ICD-10-CM

## 2013-04-05 DIAGNOSIS — I4891 Unspecified atrial fibrillation: Secondary | ICD-10-CM

## 2013-04-05 DIAGNOSIS — R791 Abnormal coagulation profile: Secondary | ICD-10-CM | POA: Diagnosis not present

## 2013-04-05 DIAGNOSIS — Z7901 Long term (current) use of anticoagulants: Secondary | ICD-10-CM

## 2013-04-05 DIAGNOSIS — I2510 Atherosclerotic heart disease of native coronary artery without angina pectoris: Secondary | ICD-10-CM | POA: Diagnosis present

## 2013-04-05 DIAGNOSIS — S0003XA Contusion of scalp, initial encounter: Secondary | ICD-10-CM | POA: Diagnosis present

## 2013-04-05 DIAGNOSIS — I6529 Occlusion and stenosis of unspecified carotid artery: Secondary | ICD-10-CM | POA: Diagnosis present

## 2013-04-05 DIAGNOSIS — Z79899 Other long term (current) drug therapy: Secondary | ICD-10-CM

## 2013-04-05 DIAGNOSIS — W010XXA Fall on same level from slipping, tripping and stumbling without subsequent striking against object, initial encounter: Secondary | ICD-10-CM | POA: Diagnosis present

## 2013-04-05 DIAGNOSIS — Z794 Long term (current) use of insulin: Secondary | ICD-10-CM

## 2013-04-05 DIAGNOSIS — Z85828 Personal history of other malignant neoplasm of skin: Secondary | ICD-10-CM

## 2013-04-05 DIAGNOSIS — M8448XA Pathological fracture, other site, initial encounter for fracture: Principal | ICD-10-CM | POA: Diagnosis present

## 2013-04-05 DIAGNOSIS — Z950 Presence of cardiac pacemaker: Secondary | ICD-10-CM

## 2013-04-05 DIAGNOSIS — I5042 Chronic combined systolic (congestive) and diastolic (congestive) heart failure: Secondary | ICD-10-CM | POA: Diagnosis present

## 2013-04-05 DIAGNOSIS — I252 Old myocardial infarction: Secondary | ICD-10-CM

## 2013-04-05 LAB — URINALYSIS, ROUTINE W REFLEX MICROSCOPIC
Bilirubin Urine: NEGATIVE
Hgb urine dipstick: NEGATIVE
Ketones, ur: NEGATIVE mg/dL
Nitrite: NEGATIVE
Protein, ur: NEGATIVE mg/dL
Specific Gravity, Urine: 1.013 (ref 1.005–1.030)
Urobilinogen, UA: 0.2 mg/dL (ref 0.0–1.0)
pH: 8 (ref 5.0–8.0)

## 2013-04-05 LAB — CBC
HCT: 38.8 % (ref 36.0–46.0)
Hemoglobin: 12.5 g/dL (ref 12.0–15.0)
MCH: 27.2 pg (ref 26.0–34.0)
MCHC: 32.2 g/dL (ref 30.0–36.0)
MCV: 84.3 fL (ref 78.0–100.0)
Platelets: 201 10*3/uL (ref 150–400)

## 2013-04-05 LAB — BASIC METABOLIC PANEL
BUN: 14 mg/dL (ref 6–23)
CO2: 27 mEq/L (ref 19–32)
Calcium: 10.4 mg/dL (ref 8.4–10.5)
Creatinine, Ser: 0.68 mg/dL (ref 0.50–1.10)
GFR calc Af Amer: >90 mL/min (ref >90)
GFR calc non Af Amer: 80 mL/min — ABNORMAL LOW (ref >90)
Potassium: 3.9 mEq/L (ref 3.5–5.1)
Sodium: 136 mEq/L (ref 135–145)

## 2013-04-05 LAB — URINE MICROSCOPIC-ADD ON

## 2013-04-05 LAB — PROTIME-INR: INR: 2.39 — ABNORMAL HIGH (ref 0.00–1.49)

## 2013-04-05 LAB — GLUCOSE, CAPILLARY: Glucose-Capillary: 143 mg/dL — ABNORMAL HIGH (ref 70–99)

## 2013-04-05 LAB — POCT INR: INR: 2.6

## 2013-04-05 MED ORDER — FENTANYL CITRATE 0.05 MG/ML IJ SOLN
25.0000 ug | Freq: Once | INTRAMUSCULAR | Status: AC
  Administered 2013-04-05: 25 ug via INTRAVENOUS
  Filled 2013-04-05: qty 2

## 2013-04-05 NOTE — ED Notes (Signed)
Pt reported to EMS she tripped over a trash bag in the garage and hit her head on concraet floor. Pt is on coumadin and last INR 2.6  Today at PCP office. Pt A/O .

## 2013-04-05 NOTE — Progress Notes (Signed)
Diabetes Follow-Up Visit Chief Complaint:   No chief complaint on file.    Filed Vitals:   04/05/13 1246  BP: 112/58  Pulse: 68    HPI:  Patient with uncontrolled DM.     Current Diabetes Medications:  Levemir 28 units each morning and Novolog 12 units with breakfast and lunch and 8 units with supper HBG readings ( in am) - 145, 183, 236, 193, 138, 169, 198, 192, 223,    (in pm ) - 226, 81, 91, 79, 175, 194,   Patient sometimes skip lunch and lunchtime insulin if she eats late breakfast but BG gets too low near 4-5pm.  Exam Edema:  Trace  Polyuria:  negative  Polydipsia:  positive Polyphagia:  negative   General Appearance:  alert, oriented, no acute distress, well nourished and obese Mood/Affect:  normal   Low fat/carbohydrate diet?  No Nicotine Abuse?  No Medication Compliance?  No Exercise?  No Alcohol Abuse?  No  Home BG Monitoring:  Checking 1 times a day. Average:  172  High: 236  Low:  79    Lab Results  Component Value Date   HGBA1C 7.4 03/08/2013    No results found for this basenameConcepcion [name]    Lab Results  Component Value Date   CHOL CANCELED 03/08/2013   HDL 42 03/08/2013   LDLCALC 100* 03/08/2013   TRIG 233* 03/08/2013   CHOLHDL 3.7 01/22/2012      Assessment: 1.  Diabetes.  uncontrolled 2.  Blood Pressure.  At goal today 3.  Lipids.  Tg and LDL both slightly elevated. Possible ADR to Livalo 4.  A. Fib / chronic anticoagulation - therapeutic  Recommendations: 1.  Medication recommendations at this time are as follows:    Continue Levemir 28 units each morning   Increase Novolog to 12 units with breakfast and lunch and continue 8 units with supper. **suggested 15 gram snack in afternoon when she skips lunch to prevent hypoglycemia   2.  Reviewed HBG goals:  Fasting 80-130 and 1-2 hour post prandial <180.  Patient is instructed to check BG 2 times per day.  Need to know pm BG reading before I adjust insulin regimen.   3.  BP goal  < 140/80. 4.  LDL goal of < 100, HDL > 40 and TG < 150. - Continue to hold Livalo for next 1-2 weeks, see if weakness improves. 5.  Eye Exam yearly and Dental Exam every 6 months. 6.   Anticoagulation Dose Instructions as of 04/05/2013     Alexandria [name] Tue Wed Thu Fri Sat   New Dose 7.5 mg 10 mg 7.5 mg 7.5 mg 7.5 mg 10 mg 7.5 mg    Description       Continue 1 and 1/2 tablets daily except 2 tablets on mondays and fridays       8.  Return to clinic in 4 weeks  Henrene Pastor, PharmD, CPP

## 2013-04-05 NOTE — ED Notes (Signed)
The tech cleaned the abrasion on the left side of head. The tech reported to RN in charge.

## 2013-04-05 NOTE — ED Provider Notes (Signed)
CSN: [id number]     Arrival date & time 04/05/13  1640 History   First MD Initiated Contact with Patient 04/05/13 1640     Chief Complaint  Patient presents with  . Fall   (Consider location/radiation/quality/duration/timing/severity/associated sxs/prior Treatment) Patient is a 79 y.o. female presenting with fall.  Fall This is a new problem. The current episode started today. Episode frequency: once. Pertinent negatives include no abdominal pain, chest pain, chills, congestion, coughing, fever, headaches, myalgias, nausea, numbness, rash, sore throat or vomiting. Nothing aggravates the symptoms. She has tried nothing for the symptoms.    Past Medical History  Diagnosis Date  . Hypertension     with h/o hypertensive urgency  . Glaucoma   . Tremor, essential   . Diabetes mellitus with neuropathy   . B12 deficiency   . Hyperlipidemia   . Atrial fibrillation     a. on chronic anticoagulation with warfarin  b. Tiksosyn discontinued and amiodarone  was discontinued secondary to perceived symptoms  . Chronic diastolic heart failure     a. echo 05/2009: mild LVH, EF 55-60%, grade 2 diast dysfxn, mild LAE, PASP;   b. Echo 3/13:   mod LVH, EF 55-60%, mild to mod calc AV annulus, very mild AS (mean 9 mmHg)  . CAD (coronary artery disease)     a.  b. 07/2011 NSTEMI - PCI/DES LCX - Resolute stent;;   b. LHC 12/27/11: mLAD 30%, prox-mid D1 90% (small);  CFX patent stent, OM3 small 95%,, mRCA 30%, EF 65%  -  med Rx;   . Carotid stenosis     a. dopplers 6/12: 1-39% bilat ICA; b. carotid dopplers 7/13: bilat 1-39% ICA  . Pulmonary fibrosis     Dr. Vassie Loll;  patient taken off O2 in 8/12; dyspnea felt CHF>>ILD  . Skin cancer   . Colon polyps   . Syncope     unknown etiology  . Bradycardia   . Pacemaker 02/29/2012  . Diabetes mellitus     insulin controled  . Elevated TSH 05/2012  . Normocytic anemia 05/2012  . CHF (congestive heart failure)    Past Surgical History  Procedure Laterality Date  .  Appendectomy    . Nose surgery    . Colonoscopy    . Cardiac catheterization  2013    stent  . Coronary angioplasty with stent placement    . Insert / replace / remove pacemaker  02/29/2012  . Dilation and curettage of uterus     Family History  Problem Relation Age of Onset  . Throat cancer Brother   . Prostate cancer Father   . Diabetes Mother   . Diabetes Brother     multiple  . Heart disease Brother   . Colon cancer Neg Hx    History  Substance Use Topics  . Smoking status: Never Smoker   . Smokeless tobacco: Never Used  . Alcohol Use: No   OB History   Grav Para Term Preterm Abortions TAB SAB Ect Mult Living                 Review of Systems  Constitutional: Negative for fever and chills.  HENT: Negative for congestion, rhinorrhea and sore throat.   Eyes: Negative for photophobia and visual disturbance.  Respiratory: Negative for cough and shortness of breath.   Cardiovascular: Negative for chest pain and leg swelling.  Gastrointestinal: Negative for nausea, vomiting, abdominal pain, diarrhea and constipation.  Endocrine: Negative for polyphagia and polyuria.  Genitourinary: Negative  for dysuria, flank pain, vaginal bleeding, vaginal discharge and enuresis.  Musculoskeletal: Negative for back pain, gait problem and myalgias.  Skin: Negative for color change and rash.  Neurological: Negative for dizziness, syncope, light-headedness, numbness and headaches.  Hematological: Negative for adenopathy. Does not bruise/bleed easily.  All other systems reviewed and are negative.    Allergies  Prednisone; Ace inhibitors; Crestor; Lipitor; Niaspan; and Olmesartan  Home Medications   No current outpatient prescriptions on file. BP 121/61  Pulse 67  Temp(Src) 98.2 F (36.8 C) (Oral)  Resp 18  Ht 5\' 3"  (1.6 m)  Wt 185 lb 4.8 oz (84.052 kg)  BMI 32.83 kg/m2  SpO2 100% Physical Exam  Vitals reviewed. Constitutional: She is oriented to person, place, and time. She  appears well-developed and well-nourished.  HENT:  Head: Normocephalic and atraumatic.  Right Ear: External ear normal.  Left Ear: External ear normal.  Eyes: Conjunctivae and EOM are normal. Pupils are equal, round, and reactive to light.  Cardiovascular: Normal rate, regular rhythm, normal heart sounds and intact distal pulses.   Pulmonary/Chest: Effort normal and breath sounds normal.  Abdominal: Soft. Bowel sounds are normal. There is no tenderness.  Musculoskeletal: Normal range of motion.       Left hip: She exhibits tenderness and bony tenderness.       Cervical back: Normal.       Thoracic back: Normal.       Lumbar back: She exhibits tenderness and bony tenderness.  Neurological: She is alert and oriented to person, place, and time.  Skin: Skin is warm and dry.    ED Course  Procedures (including critical care time) Labs Review Labs Reviewed  CBC - Abnormal; Notable for the following:    WBC 11.8 (*)    All other components within normal limits  BASIC METABOLIC PANEL - Abnormal; Notable for the following:    Glucose, Bld 179 (*)    GFR calc non Af Amer 80 (*)    All other components within normal limits  URINALYSIS, ROUTINE W REFLEX MICROSCOPIC - Abnormal; Notable for the following:    Leukocytes, UA TRACE (*)    All other components within normal limits  PROTIME-INR - Abnormal; Notable for the following:    Prothrombin Time 25.3 (*)    INR 2.39 (*)    All other components within normal limits  GLUCOSE, CAPILLARY - Abnormal; Notable for the following:    Glucose-Capillary 143 (*)    All other components within normal limits  URINE MICROSCOPIC-ADD ON - Abnormal; Notable for the following:    Squamous Epithelial / LPF FEW (*)    Bacteria, UA FEW (*)    Casts HYALINE CASTS (*)    All other components within normal limits  PROTIME-INR - Abnormal; Notable for the following:    Prothrombin Time 23.3 (*)    INR 2.15 (*)    All other components within normal limits   CBC - Abnormal; Notable for the following:    Hemoglobin 11.8 (*)    HCT 34.5 (*)    All other components within normal limits  GLUCOSE, CAPILLARY - Abnormal; Notable for the following:    Glucose-Capillary 190 (*)    All other components within normal limits  GLUCOSE, CAPILLARY - Abnormal; Notable for the following:    Glucose-Capillary 257 (*)    All other components within normal limits  GLUCOSE, CAPILLARY - Abnormal; Notable for the following:    Glucose-Capillary 223 (*)    All other components  within normal limits  GLUCOSE, CAPILLARY - Abnormal; Notable for the following:    Glucose-Capillary 215 (*)    All other components within normal limits  PROTIME-INR  CBC   Imaging Review Dg Thoracic Spine 2 View  04/05/2013   CLINICAL DATA:  Fall.  Mid and low back pain.  EXAM: THORACIC SPINE - 2 VIEW  COMPARISON:  01/21/2012.  FINDINGS: Chronic T8 compression fracture is present. There are no interval new compression fractures identified. Upper thoracic spine poorly seen due to bony overlap. Thoracolumbar junction degenerative disc disease is present with vacuum disc and marginal osteophytes. Aortic atherosclerosis. Small vessel MEDICATIONS visceral atherosclerosis. Partially visualized 2 lead left subclavian cardiac pacemaker.  IMPRESSION: No acute osseous abnormality. Chronic T8 compression fracture.   Electronically Signed   By: Andreas Newport M.D.   On: 04/05/2013 18:47   Dg Lumbar Spine 2-3 Views  04/05/2013   CLINICAL DATA:  Fall today. Mid and low back pain.  EXAM: LUMBAR SPINE - 2-3 VIEW  COMPARISON:  None.  FINDINGS: Mild levoconvex lumbar curvature. Trace anterolisthesis of L3 on L4, measuring 2 mm. Severe L4-L5 spondylosis is present with vacuum disk. There is a superior endplate L1 compression fracture ; loss of height is less than 25%. No radiographic evidence of retropulsion. This level is only partially seen on a prior chest CT from 2011 however no endplate deformity was  present at that time suggesting an interval compression fracture. Strictly speaking, this is age indeterminate. Atherosclerosis.  IMPRESSION: 1. Age indeterminate L1 superior endplate compression fracture without radiographic evidence of retropulsion. MRI a can be used to assess for marrow edema and determine age of the fracture. 2. L4-L5 predominant lumbar spondylosis.   Electronically Signed   By: Andreas Newport M.D.   On: 04/05/2013 18:46   Dg Pelvis 1-2 Views  04/05/2013   CLINICAL DATA:  Fall. Back pain.  EXAM: PELVIS - 1-2 VIEW  COMPARISON:  None.  FINDINGS: Obturator rings appear intact. No displaced femoral neck fracture. Atherosclerosis. L4-L5 predominant lumbar spondylosis.  IMPRESSION: No acute osseous abnormality.   Electronically Signed   By: Andreas Newport M.D.   On: 04/05/2013 18:47   Ct Head Wo Contrast  04/05/2013   CLINICAL DATA:  Patient fell hitting her head on a concrete floor. Patient is on Coumadin.  EXAM: CT HEAD WITHOUT CONTRAST  CT CERVICAL SPINE WITHOUT CONTRAST  TECHNIQUE: Multidetector CT imaging of the head and cervical spine was performed following the standard protocol without intravenous contrast. Multiplanar CT image reconstructions of the cervical spine were also generated.  COMPARISON:  08/12/2012  FINDINGS: CT HEAD FINDINGS  The ventricles are normal in configuration. There is ventricular and sulcal enlargement reflecting moderate atrophy.  There are no parenchymal masses or mass effect. There is extensive white matter hypoattenuation most consistent with chronic microvascular ischemic change. There is no evidence of a recent cortical infarct.  There are no extra-axial masses or abnormal fluid collections.  There is no intracranial hemorrhage.  No skull fracture is seen. The visualized sinuses and mastoid air cells are clear.  There is a left parietal scalp hematoma with a fluid fluid level along its posterior margin.  CT CERVICAL SPINE FINDINGS  No fracture. No  spondylolisthesis.  There are degenerative changes with loss of disc height and endplate spurring most notably at C5-C6 and C6-C7. There are facet degenerative changes. Facet joints are fused on the right at C2-C3 and C3-C4 and on the left at C2-C3.  The bones are  extensively demineralized.  Soft tissues show prominent chronic calcifications but are otherwise unremarkable.  The lung apices show chronic scarring.  IMPRESSION: CT HEAD IMPRESSION  1. No acute intracranial abnormalities. No intracranial hemorrhage. 2. Left parietal scalp hematoma. No skull fracture.  CT CERVICAL SPINE IMPRESSION  1. No fracture or acute finding. 2. There are significant degenerative changes and diffuse bony demineralization as described.   Electronically Signed   By: Amie Portland M.D.   On: 04/05/2013 19:14   Ct Cervical Spine Wo Contrast  04/05/2013   CLINICAL DATA:  Patient fell hitting her head on a concrete floor. Patient is on Coumadin.  EXAM: CT HEAD WITHOUT CONTRAST  CT CERVICAL SPINE WITHOUT CONTRAST  TECHNIQUE: Multidetector CT imaging of the head and cervical spine was performed following the standard protocol without intravenous contrast. Multiplanar CT image reconstructions of the cervical spine were also generated.  COMPARISON:  08/12/2012  FINDINGS: CT HEAD FINDINGS  The ventricles are normal in configuration. There is ventricular and sulcal enlargement reflecting moderate atrophy.  There are no parenchymal masses or mass effect. There is extensive white matter hypoattenuation most consistent with chronic microvascular ischemic change. There is no evidence of a recent cortical infarct.  There are no extra-axial masses or abnormal fluid collections.  There is no intracranial hemorrhage.  No skull fracture is seen. The visualized sinuses and mastoid air cells are clear.  There is a left parietal scalp hematoma with a fluid fluid level along its posterior margin.  CT CERVICAL SPINE FINDINGS  No fracture. No  spondylolisthesis.  There are degenerative changes with loss of disc height and endplate spurring most notably at C5-C6 and C6-C7. There are facet degenerative changes. Facet joints are fused on the right at C2-C3 and C3-C4 and on the left at C2-C3.  The bones are extensively demineralized.  Soft tissues show prominent chronic calcifications but are otherwise unremarkable.  The lung apices show chronic scarring.  IMPRESSION: CT HEAD IMPRESSION  1. No acute intracranial abnormalities. No intracranial hemorrhage. 2. Left parietal scalp hematoma. No skull fracture.  CT CERVICAL SPINE IMPRESSION  1. No fracture or acute finding. 2. There are significant degenerative changes and diffuse bony demineralization as described.   Electronically Signed   By: Amie Portland M.D.   On: 04/05/2013 19:14    EKG Interpretation     Ventricular Rate:    PR Interval:    QRS Duration:   QT Interval:    QTC Calculation:   R Axis:     Text Interpretation:              Date: 04/05/2013  Rate: 79  Rhythm: normal sinus rhythm  QRS Axis: normal  Intervals: normal  ST/T Wave abnormalities: Nonspecific St and T wave changes  Conduction Disutrbances: none  Narrative Interpretation: Normal sinus rhythm with nonspecific ST and t wave changes  Old EKG Reviewed: No significant changes noted    MDM   1. Atrial fibrillation   2. Chronic diastolic CHF (congestive heart failure)   3. Type II or unspecified type diabetes mellitus without mention of complication, not stated as uncontrolled   4. Unspecified essential hypertension   5. Compression fracture of L1 lumbar vertebra, closed, initial encounter    79 y.o. female  with pertinent PMH of coronary artery disease, atrial fibrillation on Coumadin diabetes, and CHF presents with left hip and back pain after fall. Patient describes fall as mechanical not precipitated by any antecedent lightheadedness or syncope. Patient describes it to  her head without loss  conscious. Patient denies recent fever cough congestion sore throat chest pain or other concerning findings. Physical exam as above.  Imaging returned with likely L1 comp fracture given midline tenderness.  Consulted [facility] who recommended TLSO.  Pt without help at home, so consulted medicine for admission.   Labs and imaging as above reviewed by myself and attending,Dr. Romeo Apple, with whom case was discussed.   1. Atrial fibrillation   2. Chronic diastolic CHF (congestive heart failure)   3. Type II or unspecified type diabetes mellitus without mention of complication, not stated as uncontrolled   4. Unspecified essential hypertension   5. Compression fracture of L1 lumbar vertebra, closed, initial encounter         Noel Gerold, MD 04/06/13 1610

## 2013-04-05 NOTE — H&P (Signed)
[facility] Admission History and Physical Service Pager: [phone]  Patient name: Alexandria [name] Medical record number: [id number] Date of birth: 10-07-1929 Age: 79 y.o. Gender: female  Primary Care Provider: [name], MD Consultants: Neurosurgery Code Status: Full  Chief Complaint: Fall  Assessment and Plan: Alexandria [name] is a 79 y.o. female presenting with headache and back pain after a fall resulting from tripping. PMH is significant for AFib on coumadin, CHF, implanted pacemaker, T2DM.   # Fall with scalp hematoma and laceration: without preceding symptoms, seems it is most likely due to tripping; CT scan negative for intracranial bleed - Neuro checks q2h given patient anticoagulated - Neurosurgery recommends TLSO brace  Nursing to page ortho tech  - PT/OT - Tylenol 650mg  prn, vicodin 5-325mg  1-2tabs prn headache - Holding anti-emetic   # AFib: currently in NSR, INR 2.36, pt with implanted pacemaker - Continue home metoprolol 50mg  BID - Continue coumadin based on INR in AM (usual regimen 7.5mg  daily with 10mg  on Mon and Fri) - Telemetry  # CHF, diastolic, last Echo in 2013 - Continue lasix 80mg  BID  # HTN:  - Continue metoprolol, norvasc  # T2DM: Home regimen: Levemir 28u qAM, novolog 12u with breakfast, lunch, and 8u with dinner.Hb A1c was 7.4 in September.  - CBG 4 times daily  - Levemir 14u qAM, Sensitive SSI. Plan to titrate after 24hr  # Interstitial lung disease: Has been seen as outpatient at [facility], on 2L home O2 while sleeping only.  - Continue 2L O2 by [place], increase prn to keep sats >90%  # Hypothyroidism: TSH wnl in ED - Continue home synthroid.   FEN/GI: Saline lock IV, Carb-modified diet Prophylaxis: Continuing coumadin per MD  Disposition: Admit to telemetry inpatient, attending Dr. Lum Babe.   History of Present Illness: Alexandria [name] is a 79 y.o. female presenting with headache and back pain after mechanical fall.  PMH is significant for AFib on coumadin, CHF, implanted pacemaker, T2DM.   Alexandria [name] reports falling on concrete after catching a toe on a garbage bag in her carport this afternoon around 2pm. She then fell to the ground landing on her back and the back of her head. She immediately had some pain in her back and head, which has remained the same, improved with fentanyl given in the ED. No LOC, no preceding dizziness, light-headedness, palpitations, diaphoresis. She denies recent falls. She lives alone. She is on coumadin for atrial fibrillation and had been seen at her PCP's office earlier that morning where her INR was 2.6.   In the ED she was found to have an age indeterminate L1 compression fracture, a chronic T8 compression fracture, no intracranial hemorrhage, and parietal scalp hematoma without skull fracture. Neurosurgical consultant recommended TLSO brace and outpatient follow up. At the time of our evaluation, she denied any back pain at rest, but says it was greatly exacerbated with movement.   Review Of Systems: Per HPI Otherwise 12 point review of systems was performed and was unremarkable.  Patient Active Problem List   Diagnosis Date Noted  . Afib 09/13/2012  . Vertigo 08/12/2012  . Pacemaker-[employer] 03/06/2012  . Chronic diastolic CHF (congestive heart failure) 01/23/2012  . Occlusion and stenosis of carotid artery without mention of cerebral infarction 01/19/2012  . Tachycardia-bradycardia syndrome 10/10/2011  . Hypomagnesemia 09/01/2011  . Weakness generalized 08/31/2011  . Leukocytosis 08/31/2011  . UTI (lower urinary tract infection) 08/17/2011  . Acute on chronic diastolic heart failure 05/20/2011  .  EDEMA 10/22/2009  . Atrial fibrillation 09/05/2009  . Acute on chronic combined systolic and diastolic heart failure 07/18/2009  . CAD 06/16/2009  . OBESITY 02/25/2009  . PREMATURE ATRIAL CONTRACTIONS 02/25/2009  . DYSPNEA 02/25/2009  . DIABETES MELLITUS 07/28/2007  .  HYPERLIPIDEMIA 07/28/2007  . HYPERTENSION 07/28/2007  . COLONIC POLYPS, ADENOMATOUS, HX OF 05/31/2007   Past Medical History: Past Medical History  Diagnosis Date  . Hypertension     with h/o hypertensive urgency  . Glaucoma   . Tremor, essential   . Diabetes mellitus with neuropathy   . B12 deficiency   . Hyperlipidemia   . Atrial fibrillation     a. on chronic anticoagulation with warfarin  b. Tiksosyn discontinued and amiodarone  was discontinued secondary to perceived symptoms  . Chronic diastolic heart failure     a. echo 05/2009: mild LVH, EF 55-60%, grade 2 diast dysfxn, mild LAE, PASP;   b. Echo 3/13:   mod LVH, EF 55-60%, mild to mod calc AV annulus, very mild AS (mean 9 mmHg)  . CAD (coronary artery disease)     a.  b. 07/2011 NSTEMI - PCI/DES LCX - Resolute stent;;   b. LHC 12/27/11: mLAD 30%, prox-mid D1 90% (small);  CFX patent stent, OM3 small 95%,, mRCA 30%, EF 65%  -  med Rx;   . Carotid stenosis     a. dopplers 6/12: 1-39% bilat ICA; b. carotid dopplers 7/13: bilat 1-39% ICA  . Pulmonary fibrosis     Dr. Vassie Loll;  patient taken off O2 in 8/12; dyspnea felt CHF>>ILD  . Skin cancer   . Colon polyps   . Syncope     unknown etiology  . Bradycardia   . Pacemaker 02/29/2012  . Diabetes mellitus     insulin controled  . Elevated TSH 05/2012  . Normocytic anemia 05/2012  . CHF (congestive heart failure)    Past Surgical History: Past Surgical History  Procedure Laterality Date  . Appendectomy    . Nose surgery    . Colonoscopy    . Cardiac catheterization  2013    stent  . Coronary angioplasty with stent placement    . Insert / replace / remove pacemaker  02/29/2012  . Dilation and curettage of uterus     Social History: History  Substance Use Topics  . Smoking status: Never Smoker   . Smokeless tobacco: Never Used  . Alcohol Use: No   Additional social history: Lives alone Please also refer to relevant sections of EMR.  Family History: Family History   Problem Relation Age of Onset  . Throat cancer Brother   . Prostate cancer Father   . Diabetes Mother   . Diabetes Brother     multiple  . Heart disease Brother   . Colon cancer Neg Hx    Allergies and Medications: Allergies  Allergen Reactions  . Prednisone Other (See Comments)    REACTION: "retained fluid"  . Ace Inhibitors Cough       . Crestor [Rosuvastatin Calcium] Other (See Comments)    Fatigue and leg pain   . Lipitor [Atorvastatin Calcium] Other (See Comments)    Myalgia and fatigue   . Niaspan [Niacin] Other (See Comments)    unknown  . Olmesartan Cough   Current Facility-Administered Medications on File Prior to Encounter  Medication Dose Route Frequency Provider Last Rate Last Dose  . cyanocobalamin ((VITAMIN B-12)) injection 1,000 mcg  1,000 mcg Intramuscular Q30 days Tammy Eckard, PHARMD   1,000  mcg at 03/08/13 1147   Current Outpatient Prescriptions on File Prior to Encounter  Medication Sig Dispense Refill  . amLODipine (NORVASC) 5 MG tablet Take 5 mg by mouth daily.      Marland Kitchen BESIVANCE 0.6 % SUSP Place 1 drop into the right eye See admin instructions. Use the evening of and day after eye injecton      . Cranberry (SM CRANBERRY) 300 MG tablet Take 300 mg by mouth daily.      . fish oil-omega-3 fatty acids 1000 MG capsule Take 1 g by mouth at bedtime.       . furosemide (LASIX) 80 MG tablet Take 1 tablet (80 mg total) by mouth 2 (two) times daily.  60 tablet  10  . insulin aspart (NOVOLOG FLEXPEN) 100 UNIT/ML injection Inject 10 Units into the skin 3 (three) times daily before meals. Inject 10 units before breakfast and lunch and 8 units before supper  1 vial    . insulin detemir (LEVEMIR) 100 UNIT/ML injection Inject 0.28 mLs (28 Units total) into the skin every morning.  10 mL  11  . isosorbide mononitrate (IMDUR) 60 MG 24 hr tablet Take 60 mg by mouth daily.      Marland Kitchen latanoprost (XALATAN) 0.005 % ophthalmic solution Place 1 drop into both eyes at bedtime.        . magnesium oxide (MAG-OX) 400 MG tablet Take 800 mg by mouth 2 (two) times daily.       . metFORMIN (GLUCOPHAGE) 1000 MG tablet TAKE ONE TABLET BY MOUTH TWICE A DAY WITH MEALS  60 tablet  4  . nitroGLYCERIN (NITROSTAT) 0.4 MG SL tablet Place 0.4 mg under the tongue every 5 (five) minutes as needed. As needed for chest pain      . [DISCONTINUED] diltiazem (TIAZAC) 420 MG 24 hr capsule Take 420 mg by mouth daily.        Objective: BP 188/78  Pulse 90  Resp 22  SpO2 93% Exam: General: 79 yo laying flat in bed in NAD HEENT: approximately 4cm diameter round hematoma to the L posterior parietal scalp with abrasion, without laceration, PERRL Cardiovascular: Regular rate and rhythm, S1 S2 wnl, no murmur, trace pitting edema bilateral LEs Respiratory: non-labored, CTAB Abdomen: Obese, soft, NT, ND, normal BS MSK: point tenderness to L posterior superior iliac spine and L paraspinal tenderness with spasm. Limited ROM to back flexion in bed. No pelvic instability. No other bony point tenderness found on exam.  Skin: 4cm parietal scalp hematoma as above, otherwise no signs of trauma, no bleeding.  Neuro: AOx3, CN II-XII intact, normal sensation bilaterally, gait not assessed.   Labs and Imaging: CBC BMET   Recent Labs Lab 04/05/13 1910  WBC 11.8*  HGB 12.5  HCT 38.8  PLT 201    Recent Labs Lab 04/05/13 1910  NA 136  K 3.9  CL 96  CO2 27  BUN 14  CREATININE 0.68  GLUCOSE 179*  CALCIUM 10.4     PT: 25.3 INR: 2.39 U/A: few bacteria, trace leukocytes, few squamous cells CT HEAD  FINDINGS  The ventricles are normal in configuration. There is ventricular and  sulcal enlargement reflecting moderate atrophy.  There are no parenchymal masses or mass effect. There is extensive  white matter hypoattenuation most consistent with chronic  microvascular ischemic change. There is no evidence of a recent  cortical infarct.  There are no extra-axial masses or abnormal fluid collections.   There is no intracranial hemorrhage.  No  skull fracture is seen. The visualized sinuses and mastoid air  cells are clear.  There is a left parietal scalp hematoma with a fluid fluid level  along its posterior margin.  IMPRESSION:  1. No acute intracranial abnormalities. No intracranial hemorrhage.  2. Left parietal scalp hematoma. No skull fracture.   CT CERVICAL SPINE  FINDINGS  No fracture. No spondylolisthesis.  There are degenerative changes with loss of disc height and endplate  spurring most notably at C5-C6 and C6-C7. There are facet  degenerative changes. Facet joints are fused on the right at C2-C3  and C3-C4 and on the left at C2-C3.  The bones are extensively demineralized.  Soft tissues show prominent chronic calcifications but are otherwise  unremarkable.  The lung apices show chronic scarring.  IMPRESSION  1. No fracture or acute finding.  2. There are significant degenerative changes and diffuse bony  demineralization as described.  THORACIC SPINE XR FINDINGS:  Chronic T8 compression fracture is present. There are no interval  new compression fractures identified. Upper thoracic spine poorly  seen due to bony overlap. Thoracolumbar junction degenerative disc  disease is present with vacuum disc and marginal osteophytes. Aortic  atherosclerosis. Small vessel MEDICATIONS visceral atherosclerosis.  Partially visualized 2 lead left subclavian cardiac pacemaker.  IMPRESSION:  No acute osseous abnormality. Chronic T8 compression fracture.  LUMBAR SPINE XR FINDINGS:  Mild levoconvex lumbar curvature. Trace anterolisthesis of L3 on L4,  measuring 2 mm. Severe L4-L5 spondylosis is present with vacuum  disk. There is a superior endplate L1 compression fracture ; loss of  height is less than 25%. No radiographic evidence of retropulsion.  This level is only partially seen on a prior chest CT from 2011  however no endplate deformity was present at that time suggesting  an  interval compression fracture. Strictly speaking, this is age  indeterminate. Atherosclerosis.  IMPRESSION:  1. Age indeterminate L1 superior endplate compression fracture  without radiographic evidence of retropulsion. MRI a can be used to  assess for marrow edema and determine age of the fracture.  2. L4-L5 predominant lumbar spondylosis.  PELVIS XR FINDINGS:  Obturator rings appear intact. No displaced femoral neck fracture.  Atherosclerosis. L4-L5 predominant lumbar spondylosis.  IMPRESSION:  No acute osseous abnormality.  Hazeline Junker, MD 04/05/2013, 10:50 PM PGY-1, [facility] FPTS Intern pager: [phone], text pages welcome  I have evaluated that patient and read the history and physical written above. The most important issue in the present is to make sure the patient does not develop a bleed, thus she will have neuro checks q 2 hours. Thereafter, it is likely fine to resume anticoagulation tomorrow. We will then need to obtain the brace recommended by neurosurgery and assess her function status for disposition planning.   Si Raider Clinton Sawyer, MD, MBA 04/06/2013, 12:22 AM Family Medicine Resident, PGY-3

## 2013-04-05 NOTE — ED Notes (Signed)
MD in talking with pt and family giving up date on plan of care.  Pain  Med given

## 2013-04-05 NOTE — Consult Note (Signed)
Reason for Consult:  L1 fracture Referring Physician:  Hazeline Junker, MD (Family Medicine resident)  Alexandria [name] is an 79 y.o. female.  HPI: Patient is seen in neurosurgical consultation regarding back pain following a fall. Patient explains that she tripped over a bag of trash at home, falling on the concrete, landing on her back, buttock, and back of her head. She denies loss of consciousness. She was brought to the [facility] emergency room for evaluation. Patient is complaining of pain in the lower back bilaterally. Workup by the emergency room staff revealed on x-ray a mild chronic T8 compression fracture, and a mild L1 compression fracture of indeterminate age.  Patient has numerous significant medical comorbidities including hypertension, diabetes, atrial fibrillation on anticoagulation with Coumadin, atherosclerotic coronary artery disease, atherosclerotic peripheral vascular disease, pulmonary fibrosis. She has 2-lead pacemaker.  Neurosurgical consultation was requested regarding the L1 compression fracture, which is suspected to be causing her back pain.  Past Medical History:  Past Medical History  Diagnosis Date  . Hypertension     with h/o hypertensive urgency  . Glaucoma   . Tremor, essential   . Diabetes mellitus with neuropathy   . B12 deficiency   . Hyperlipidemia   . Atrial fibrillation     a. on chronic anticoagulation with warfarin  b. Tiksosyn discontinued and amiodarone  was discontinued secondary to perceived symptoms  . Chronic diastolic heart failure     a. echo 05/2009: mild LVH, EF 55-60%, grade 2 diast dysfxn, mild LAE, PASP;   b. Echo 3/13:   mod LVH, EF 55-60%, mild to mod calc AV annulus, very mild AS (mean 9 mmHg)  . CAD (coronary artery disease)     a.  b. 07/2011 NSTEMI - PCI/DES LCX - Resolute stent;;   b. LHC 12/27/11: mLAD 30%, prox-mid D1 90% (small);  CFX patent stent, OM3 small 95%,, mRCA 30%, EF 65%  -  med Rx;   . Carotid stenosis     a.  dopplers 6/12: 1-39% bilat ICA; b. carotid dopplers 7/13: bilat 1-39% ICA  . Pulmonary fibrosis     Dr. Vassie Loll;  patient taken off O2 in 8/12; dyspnea felt CHF>>ILD  . Skin cancer   . Colon polyps   . Syncope     unknown etiology  . Bradycardia   . Pacemaker 02/29/2012  . Diabetes mellitus     insulin controled  . Elevated TSH 05/2012  . Normocytic anemia 05/2012  . CHF (congestive heart failure)     Past Surgical History:  Past Surgical History  Procedure Laterality Date  . Appendectomy    . Nose surgery    . Colonoscopy    . Cardiac catheterization  2013    stent  . Coronary angioplasty with stent placement    . Insert / replace / remove pacemaker  02/29/2012  . Dilation and curettage of uterus      Family History:  Family History  Problem Relation Age of Onset  . Throat cancer Brother   . Prostate cancer Father   . Diabetes Mother   . Diabetes Brother     multiple  . Heart disease Brother   . Colon cancer Neg Hx     Social History:  reports that she has never smoked. She has never used smokeless tobacco. She reports that she does not drink alcohol or use illicit drugs.  Allergies:  Allergies  Allergen Reactions  . Prednisone Other (See Comments)    REACTION: "retained fluid"  .  Ace Inhibitors Cough       . Crestor [Rosuvastatin Calcium] Other (See Comments)    Fatigue and leg pain   . Lipitor [Atorvastatin Calcium] Other (See Comments)    Myalgia and fatigue   . Niaspan [Niacin] Other (See Comments)    unknown  . Olmesartan Cough    Medications: I have reviewed the patient's current medications.  ROS: Notable for those difficulties described inner history of present illness and past medical history, but is otherwise unremarkable.  Physical Examination: Patient is a somewhat obese white female, in discomfort, particularly when changing position, but in no acute distress. Blood pressure 188/78, pulse 90, resp. rate 22, SpO2 93.00%. Lungs:  Clear to  auscultation, symmetrical respiratory excursion. Heart:  Regular rate and rhythm, normal S1 and S2. Abdomen:  Soft, nondistended, bowel sounds present. Extremity:  No clubbing, cyanosis, or edema. Musculoskeletal:  Tender over the lumbar region diffusely. Straight leg raising negative bilaterally.  Neurological Examination: Mental Status Examination:  Awake and alert, oriented. Following commands. Speech fluent. Cranial Nerve Examination:  EOMI. Facial movements symmetrical. Tongue midline. Motor Examination:  5/5 strength in the lower extremities. Sensory Examination:  Intact to pinprick in the lower extremities. Reflex Examination:   Minimal at the quadriceps and gastrocnemius, toes are downgoing bilaterally. Gait and Stance Examination:  Not tested due to the nature the patient's injury.   Results for orders placed during the hospital encounter of 04/05/13 (from the past 48 hour(s))  URINALYSIS, ROUTINE W REFLEX MICROSCOPIC     Status: Abnormal   Collection Time    04/05/13  5:06 PM      Result Value Range   Color, Urine YELLOW  YELLOW   APPearance CLEAR  CLEAR   Specific Gravity, Urine 1.013  1.005 - 1.030   pH 8.0  5.0 - 8.0   Glucose, UA NEGATIVE  NEGATIVE mg/dL   Hgb urine dipstick NEGATIVE  NEGATIVE   Bilirubin Urine NEGATIVE  NEGATIVE   Ketones, ur NEGATIVE  NEGATIVE mg/dL   Protein, ur NEGATIVE  NEGATIVE mg/dL   Urobilinogen, UA 0.2  0.0 - 1.0 mg/dL   Nitrite NEGATIVE  NEGATIVE   Leukocytes, UA TRACE (*) NEGATIVE  URINE MICROSCOPIC-ADD ON     Status: Abnormal   Collection Time    04/05/13  5:06 PM      Result Value Range   Squamous Epithelial / LPF FEW (*) RARE   WBC, UA 0-2  <3 WBC/hpf   Bacteria, UA FEW (*) RARE   Casts HYALINE CASTS (*) NEGATIVE  GLUCOSE, CAPILLARY     Status: Abnormal   Collection Time    04/05/13  5:11 PM      Result Value Range   Glucose-Capillary 143 (*) 70 - 99 mg/dL  CBC     Status: Abnormal   Collection Time    04/05/13  7:10 PM       Result Value Range   WBC 11.8 (*) 4.0 - 10.5 K/uL   RBC 4.60  3.87 - 5.11 MIL/uL   Hemoglobin 12.5  12.0 - 15.0 g/dL   HCT 45.4  09.8 - 11.9 %   MCV 84.3  78.0 - 100.0 fL   MCH 27.2  26.0 - 34.0 pg   MCHC 32.2  30.0 - 36.0 g/dL   RDW 14.7  82.9 - 56.2 %   Platelets 201  150 - 400 K/uL  BASIC METABOLIC PANEL     Status: Abnormal   Collection Time    04/05/13  7:10 PM      Result Value Range   Sodium 136  135 - 145 mEq/L   Potassium 3.9  3.5 - 5.1 mEq/L   Chloride 96  96 - 112 mEq/L   CO2 27  19 - 32 mEq/L   Glucose, Bld 179 (*) 70 - 99 mg/dL   BUN 14  6 - 23 mg/dL   Creatinine, Ser 4.09  0.50 - 1.10 mg/dL   Calcium 81.1  8.4 - 91.4 mg/dL   GFR calc non Af Amer 80 (*) >90 mL/min   GFR calc Af Amer >90  >90 mL/min   Comment: (NOTE)     The eGFR has been calculated using the CKD EPI equation.     This calculation has not been validated in all clinical situations.     eGFR's persistently <90 mL/min signify possible Chronic Kidney     Disease.  PROTIME-INR     Status: Abnormal   Collection Time    04/05/13  7:10 PM      Result Value Range   Prothrombin Time 25.3 (*) 11.6 - 15.2 seconds   INR 2.39 (*) 0.00 - 1.49    Dg Thoracic Spine 2 View  04/05/2013   CLINICAL DATA:  Fall.  Mid and low back pain.  EXAM: THORACIC SPINE - 2 VIEW  COMPARISON:  01/21/2012.  FINDINGS: Chronic T8 compression fracture is present. There are no interval new compression fractures identified. Upper thoracic spine poorly seen due to bony overlap. Thoracolumbar junction degenerative disc disease is present with vacuum disc and marginal osteophytes. Aortic atherosclerosis. Small vessel MEDICATIONS visceral atherosclerosis. Partially visualized 2 lead left subclavian cardiac pacemaker.  IMPRESSION: No acute osseous abnormality. Chronic T8 compression fracture.   Electronically Signed   By: Andreas Newport M.D.   On: 04/05/2013 18:47   Dg Lumbar Spine 2-3 Views  04/05/2013   CLINICAL DATA:  Fall today. Mid and  low back pain.  EXAM: LUMBAR SPINE - 2-3 VIEW  COMPARISON:  None.  FINDINGS: Mild levoconvex lumbar curvature. Trace anterolisthesis of L3 on L4, measuring 2 mm. Severe L4-L5 spondylosis is present with vacuum disk. There is a superior endplate L1 compression fracture ; loss of height is less than 25%. No radiographic evidence of retropulsion. This level is only partially seen on a prior chest CT from 2011 however no endplate deformity was present at that time suggesting an interval compression fracture. Strictly speaking, this is age indeterminate. Atherosclerosis.  IMPRESSION: 1. Age indeterminate L1 superior endplate compression fracture without radiographic evidence of retropulsion. MRI a can be used to assess for marrow edema and determine age of the fracture. 2. L4-L5 predominant lumbar spondylosis.   Electronically Signed   By: Andreas Newport M.D.   On: 04/05/2013 18:46   Dg Pelvis 1-2 Views  04/05/2013   CLINICAL DATA:  Fall. Back pain.  EXAM: PELVIS - 1-2 VIEW  COMPARISON:  None.  FINDINGS: Obturator rings appear intact. No displaced femoral neck fracture. Atherosclerosis. L4-L5 predominant lumbar spondylosis.  IMPRESSION: No acute osseous abnormality.   Electronically Signed   By: Andreas Newport M.D.   On: 04/05/2013 18:47   Ct Head Wo Contrast  04/05/2013   CLINICAL DATA:  Patient fell hitting her head on a concrete floor. Patient is on Coumadin.  EXAM: CT HEAD WITHOUT CONTRAST  CT CERVICAL SPINE WITHOUT CONTRAST  TECHNIQUE: Multidetector CT imaging of the head and cervical spine was performed following the standard protocol without intravenous contrast. Multiplanar CT image reconstructions  of the cervical spine were also generated.  COMPARISON:  08/12/2012  FINDINGS: CT HEAD FINDINGS  The ventricles are normal in configuration. There is ventricular and sulcal enlargement reflecting moderate atrophy.  There are no parenchymal masses or mass effect. There is extensive white matter hypoattenuation  most consistent with chronic microvascular ischemic change. There is no evidence of a recent cortical infarct.  There are no extra-axial masses or abnormal fluid collections.  There is no intracranial hemorrhage.  No skull fracture is seen. The visualized sinuses and mastoid air cells are clear.  There is a left parietal scalp hematoma with a fluid fluid level along its posterior margin.  CT CERVICAL SPINE FINDINGS  No fracture. No spondylolisthesis.  There are degenerative changes with loss of disc height and endplate spurring most notably at C5-C6 and C6-C7. There are facet degenerative changes. Facet joints are fused on the right at C2-C3 and C3-C4 and on the left at C2-C3.  The bones are extensively demineralized.  Soft tissues show prominent chronic calcifications but are otherwise unremarkable.  The lung apices show chronic scarring.  IMPRESSION: CT HEAD IMPRESSION  1. No acute intracranial abnormalities. No intracranial hemorrhage. 2. Left parietal scalp hematoma. No skull fracture.  CT CERVICAL SPINE IMPRESSION  1. No fracture or acute finding. 2. There are significant degenerative changes and diffuse bony demineralization as described.   Electronically Signed   By: Amie Portland M.D.   On: 04/05/2013 19:14   Ct Cervical Spine Wo Contrast  04/05/2013   CLINICAL DATA:  Patient fell hitting her head on a concrete floor. Patient is on Coumadin.  EXAM: CT HEAD WITHOUT CONTRAST  CT CERVICAL SPINE WITHOUT CONTRAST  TECHNIQUE: Multidetector CT imaging of the head and cervical spine was performed following the standard protocol without intravenous contrast. Multiplanar CT image reconstructions of the cervical spine were also generated.  COMPARISON:  08/12/2012  FINDINGS: CT HEAD FINDINGS  The ventricles are normal in configuration. There is ventricular and sulcal enlargement reflecting moderate atrophy.  There are no parenchymal masses or mass effect. There is extensive white matter hypoattenuation most  consistent with chronic microvascular ischemic change. There is no evidence of a recent cortical infarct.  There are no extra-axial masses or abnormal fluid collections.  There is no intracranial hemorrhage.  No skull fracture is seen. The visualized sinuses and mastoid air cells are clear.  There is a left parietal scalp hematoma with a fluid fluid level along its posterior margin.  CT CERVICAL SPINE FINDINGS  No fracture. No spondylolisthesis.  There are degenerative changes with loss of disc height and endplate spurring most notably at C5-C6 and C6-C7. There are facet degenerative changes. Facet joints are fused on the right at C2-C3 and C3-C4 and on the left at C2-C3.  The bones are extensively demineralized.  Soft tissues show prominent chronic calcifications but are otherwise unremarkable.  The lung apices show chronic scarring.  IMPRESSION: CT HEAD IMPRESSION  1. No acute intracranial abnormalities. No intracranial hemorrhage. 2. Left parietal scalp hematoma. No skull fracture.  CT CERVICAL SPINE IMPRESSION  1. No fracture or acute finding. 2. There are significant degenerative changes and diffuse bony demineralization as described.   Electronically Signed   By: Amie Portland M.D.   On: 04/05/2013 19:14     Assessment/Plan: 79 year old woman who suffered injuries from a fall at home. She denies loss of consciousness, she is on Coumadin, she has a CT scan of the head this notable for generalized atrophy, but no  evidence of intracranial hemorrhage. She is complaining of pain in her lower back. X-rays of the thoracic and lumbar spine show an old T8 compression fracture (mild) as well as an L1 compression fracture of indeterminate age (mild).  In speaking with the emergency room staff regarding my recommendations. The patient is not a candidate for MRI scan to evaluation of the L1 fracture due to her pacemaker. At this time I do not feel that there is an indication for neurosurgical intervention for this  fracture. Therefore she will need to be treated symptomatically. I have recommended a Vertalign TLSO from [employer]. This will need to be worn whenever she is out of bed, but it is not to be worn in bed. [employer] will need to be contacted, and fit her for the brace. She will need consultation from physical therapy and occupational therapy to work on technique for donning and doffing the brace in bed, mobilizing, transferring, and ambulating.  I spoke with the patient regarding my assessment and recommendations, and her questions regarding her condition were answered for her.  Hewitt Shorts, MD 04/05/2013, 11:57 PM

## 2013-04-05 NOTE — Patient Instructions (Signed)
Anticoagulation Dose Instructions as of 04/05/2013     [name] Tue Wed Thu Fri Sat   New Dose 7.5 mg 10 mg 7.5 mg 7.5 mg 7.5 mg 10 mg 7.5 mg    Description       Continue 1 and 1/2 tablets daily except 2 tablets on mondays and fridays      INR was 2.6 today

## 2013-04-06 DIAGNOSIS — I509 Heart failure, unspecified: Secondary | ICD-10-CM

## 2013-04-06 DIAGNOSIS — I5032 Chronic diastolic (congestive) heart failure: Secondary | ICD-10-CM

## 2013-04-06 DIAGNOSIS — S32009A Unspecified fracture of unspecified lumbar vertebra, initial encounter for closed fracture: Secondary | ICD-10-CM

## 2013-04-06 DIAGNOSIS — S32010A Wedge compression fracture of first lumbar vertebra, initial encounter for closed fracture: Secondary | ICD-10-CM

## 2013-04-06 DIAGNOSIS — I4891 Unspecified atrial fibrillation: Secondary | ICD-10-CM

## 2013-04-06 DIAGNOSIS — I10 Essential (primary) hypertension: Secondary | ICD-10-CM

## 2013-04-06 DIAGNOSIS — E119 Type 2 diabetes mellitus without complications: Secondary | ICD-10-CM

## 2013-04-06 LAB — CBC
HCT: 34.5 % — ABNORMAL LOW (ref 36.0–46.0)
Hemoglobin: 11.8 g/dL — ABNORMAL LOW (ref 12.0–15.0)
MCH: 28.7 pg (ref 26.0–34.0)
MCHC: 34.2 g/dL (ref 30.0–36.0)
MCV: 83.9 fL (ref 78.0–100.0)
Platelets: 183 10*3/uL (ref 150–400)

## 2013-04-06 LAB — PROTIME-INR: INR: 2.15 — ABNORMAL HIGH (ref 0.00–1.49)

## 2013-04-06 LAB — GLUCOSE, CAPILLARY
Glucose-Capillary: 257 mg/dL — ABNORMAL HIGH (ref 70–99)
Glucose-Capillary: 223 mg/dL — ABNORMAL HIGH (ref 70–99)

## 2013-04-06 MED ORDER — POTASSIUM CHLORIDE CRYS ER 20 MEQ PO TBCR
20.0000 meq | EXTENDED_RELEASE_TABLET | Freq: Two times a day (BID) | ORAL | Status: DC
  Administered 2013-04-06 – 2013-04-11 (×12): 20 meq via ORAL
  Filled 2013-04-06 (×14): qty 1

## 2013-04-06 MED ORDER — INSULIN ASPART 100 UNIT/ML ~~LOC~~ SOLN
0.0000 [IU] | Freq: Three times a day (TID) | SUBCUTANEOUS | Status: DC
  Administered 2013-04-06: 17:00:00 3 [IU] via SUBCUTANEOUS
  Administered 2013-04-06: 5 [IU] via SUBCUTANEOUS
  Administered 2013-04-06: 12:00:00 3 [IU] via SUBCUTANEOUS
  Administered 2013-04-07: 2 [IU] via SUBCUTANEOUS
  Administered 2013-04-07 (×2): 3 [IU] via SUBCUTANEOUS
  Administered 2013-04-08: 12:00:00 2 [IU] via SUBCUTANEOUS
  Administered 2013-04-08: 17:00:00 5 [IU] via SUBCUTANEOUS
  Administered 2013-04-08: 2 [IU] via SUBCUTANEOUS
  Administered 2013-04-09: 5 [IU] via SUBCUTANEOUS
  Administered 2013-04-09: 12:00:00 3 [IU] via SUBCUTANEOUS
  Administered 2013-04-09 – 2013-04-10 (×2): 5 [IU] via SUBCUTANEOUS
  Administered 2013-04-10: 3 [IU] via SUBCUTANEOUS
  Administered 2013-04-10: 12:00:00 7 [IU] via SUBCUTANEOUS
  Administered 2013-04-11: 3 [IU] via SUBCUTANEOUS
  Administered 2013-04-11: 12:00:00 7 [IU] via SUBCUTANEOUS

## 2013-04-06 MED ORDER — WARFARIN SODIUM 10 MG PO TABS
10.0000 mg | ORAL_TABLET | Freq: Once | ORAL | Status: AC
  Administered 2013-04-06: 10 mg via ORAL
  Filled 2013-04-06: qty 1

## 2013-04-06 MED ORDER — ACETAMINOPHEN 325 MG PO TABS: 650.0000 mg | ORAL_TABLET | Freq: Four times a day (QID) | ORAL | Status: DC | PRN

## 2013-04-06 MED ORDER — GLUCERNA SHAKE PO LIQD: 237.0000 mL | Freq: Two times a day (BID) | ORAL | Status: DC | PRN

## 2013-04-06 MED ORDER — LEVOTHYROXINE SODIUM 50 MCG PO TABS
50.0000 ug | ORAL_TABLET | Freq: Every day | ORAL | Status: DC
  Administered 2013-04-06 – 2013-04-11 (×6): 50 ug via ORAL
  Filled 2013-04-06 (×7): qty 1

## 2013-04-06 MED ORDER — ISOSORBIDE MONONITRATE ER 60 MG PO TB24
60.0000 mg | ORAL_TABLET | Freq: Every day | ORAL | Status: DC
  Administered 2013-04-06 – 2013-04-11 (×6): 60 mg via ORAL
  Filled 2013-04-06 (×6): qty 1

## 2013-04-06 MED ORDER — DOCUSATE SODIUM 100 MG PO CAPS
100.0000 mg | ORAL_CAPSULE | Freq: Two times a day (BID) | ORAL | Status: DC
  Filled 2013-04-06 (×3): qty 1

## 2013-04-06 MED ORDER — METOPROLOL TARTRATE 50 MG PO TABS
50.0000 mg | ORAL_TABLET | Freq: Two times a day (BID) | ORAL | Status: DC
  Administered 2013-04-06 – 2013-04-11 (×12): 50 mg via ORAL
  Filled 2013-04-06 (×13): qty 1

## 2013-04-06 MED ORDER — OXYCODONE HCL 5 MG PO TABS
5.0000 mg | ORAL_TABLET | ORAL | Status: DC | PRN
  Administered 2013-04-06 – 2013-04-11 (×8): 5 mg via ORAL
  Filled 2013-04-06 (×9): qty 1

## 2013-04-06 MED ORDER — AMLODIPINE BESYLATE 5 MG PO TABS
5.0000 mg | ORAL_TABLET | Freq: Every day | ORAL | Status: DC
  Administered 2013-04-06 – 2013-04-11 (×6): 5 mg via ORAL
  Filled 2013-04-06 (×6): qty 1

## 2013-04-06 MED ORDER — LATANOPROST 0.005 % OP SOLN
1.0000 [drp] | Freq: Every day | OPHTHALMIC | Status: DC
  Administered 2013-04-06 – 2013-04-08 (×4): 1 [drp] via OPHTHALMIC
  Filled 2013-04-06: qty 2.5

## 2013-04-06 MED ORDER — ACETAMINOPHEN 325 MG PO TABS
650.0000 mg | ORAL_TABLET | Freq: Four times a day (QID) | ORAL | Status: DC
  Administered 2013-04-06 – 2013-04-11 (×18): 650 mg via ORAL
  Filled 2013-04-06 (×17): qty 2

## 2013-04-06 MED ORDER — HYDROCODONE-ACETAMINOPHEN 5-325 MG PO TABS
1.0000 | ORAL_TABLET | ORAL | Status: DC | PRN
  Administered 2013-04-06 (×2): 2 via ORAL
  Filled 2013-04-06 (×2): qty 2

## 2013-04-06 MED ORDER — ONDANSETRON HCL 4 MG/2ML IJ SOLN
4.0000 mg | Freq: Four times a day (QID) | INTRAMUSCULAR | Status: DC | PRN
  Administered 2013-04-06: 12:00:00 4 mg via INTRAVENOUS
  Filled 2013-04-06: qty 2

## 2013-04-06 MED ORDER — ACETAMINOPHEN 650 MG RE SUPP
650.0000 mg | Freq: Four times a day (QID) | RECTAL | Status: DC
  Administered 2013-04-07: 12:00:00 650 mg via RECTAL
  Filled 2013-04-06 (×21): qty 1

## 2013-04-06 MED ORDER — CALCITONIN (SALMON) 200 UNIT/ACT NA SOLN
1.0000 | Freq: Every day | NASAL | Status: DC
  Administered 2013-04-06 – 2013-04-11 (×6): 1 via NASAL
  Filled 2013-04-06: qty 3.7

## 2013-04-06 MED ORDER — ACETAMINOPHEN 650 MG RE SUPP: 650.0000 mg | Freq: Four times a day (QID) | RECTAL | Status: DC | PRN

## 2013-04-06 MED ORDER — SODIUM CHLORIDE 0.9 % IJ SOLN
3.0000 mL | Freq: Two times a day (BID) | INTRAMUSCULAR | Status: DC
  Administered 2013-04-06 – 2013-04-11 (×11): 3 mL via INTRAVENOUS

## 2013-04-06 MED ORDER — SENNA 8.6 MG PO TABS
1.0000 | ORAL_TABLET | Freq: Every day | ORAL | Status: DC
  Administered 2013-04-06 – 2013-04-11 (×6): 8.6 mg via ORAL
  Filled 2013-04-06 (×6): qty 1

## 2013-04-06 MED ORDER — INSULIN DETEMIR 100 UNIT/ML ~~LOC~~ SOLN
14.0000 [IU] | Freq: Every morning | SUBCUTANEOUS | Status: DC
  Administered 2013-04-06: 14 [IU] via SUBCUTANEOUS
  Filled 2013-04-06 (×2): qty 0.14

## 2013-04-06 MED ORDER — FUROSEMIDE 80 MG PO TABS
80.0000 mg | ORAL_TABLET | Freq: Two times a day (BID) | ORAL | Status: DC
  Administered 2013-04-06 – 2013-04-11 (×12): 80 mg via ORAL
  Filled 2013-04-06 (×15): qty 1

## 2013-04-06 MED ORDER — POLYETHYLENE GLYCOL 3350 17 G PO PACK
17.0000 g | PACK | Freq: Every day | ORAL | Status: DC
  Administered 2013-04-06 – 2013-04-11 (×6): 17 g via ORAL
  Filled 2013-04-06 (×6): qty 1

## 2013-04-06 MED ORDER — WARFARIN - PHYSICIAN DOSING INPATIENT
Freq: Every day | Status: DC
  Administered 2013-04-07: 18:00:00

## 2013-04-06 NOTE — Progress Notes (Signed)
Physical Therapy Evaluation Patient Details Name: Alexandria [name] MRN: [id number] DOB: 09-18-1929 Today's Date: 04/06/2013 Time: 1478-2956 PT Time Calculation (min): 24 min  PT Assessment / Plan / Recommendation History of Present Illness  [name] is a 79 y.o. female presenting with headache and back pain after a fall resulting from tripping--hit back of left side of head.PMH is significant for AFib on coumadin, CHF, implanted pacemaker, T2DM. x-ray a mild chronic T8 compression fracture, and a mild L1 compression fracture of indeterminate age. Appears to have BPPV. TLSO  Clinical Impression  Pt admitted with above. Pt currently with functional limitations due to the deficits listed below (see PT Problem List). Pt will need SNF for therapy.   Pt will benefit from skilled PT to increase their independence and safety with mobility to allow discharge to the venue listed below.      PT Assessment  Patient needs continued PT services    Follow Up Recommendations  SNF;Supervision/Assistance - 24 hour       Barriers to Discharge Decreased caregiver support      Equipment Recommendations  None recommended by PT         Frequency Min 5X/week    Precautions / Restrictions Precautions Precautions: Fall;Back Required Braces or Orthoses: Spinal Brace Spinal Brace: Thoracolumbosacral orthotic;Applied in supine position Restrictions Weight Bearing Restrictions: No   Pertinent Vitals/Pain VSS, Back pain       Mobility  Bed Mobility Bed Mobility: Rolling Right;Rolling Left;Left Sidelying to Sit;Sitting - Scoot to Edge of Bed Rolling Right: 4: Min assist;With rail Rolling Left: 4: Min assist;With rail Left Sidelying to Sit: 3: Mod assist;HOB flat;With rails Sitting - Scoot to Edge of Bed: 4: Min assist Details for Bed Mobility Assistance: VCs for sequence.  Noted some nystagmus with rolling right to left with pt reporting dizziness.  Tried to get pt to allow PT to perform  vestibular testing but pt would not allow.   Transfers Transfers: Sit to Stand;Stand to [employer] Transfers Sit to Stand: 4: Min assist;Without upper extremity assist;From bed Stand to Sit: 4: Min assist;With armrests;To chair/3-in-1;Without upper extremity assist Stand Pivot Transfers: 4: Min assist Details for Transfer Assistance: Pt needed cues for hand placement.  Once standing, pt stated she couldn't move her feet as she was focused on pain and dizziness, but with encouragament, pt stood and pivoted around.   Ambulation/Gait Ambulation/Gait Assistance: Not tested (comment) Stairs: No Wheelchair Mobility Wheelchair Mobility: No         PT Diagnosis: Acute pain;Generalized weakness  PT Problem List: Decreased activity tolerance;Decreased balance;Decreased mobility;Decreased knowledge of use of DME;Decreased safety awareness;Decreased knowledge of precautions;Pain PT Treatment Interventions: DME instruction;Gait training;Functional mobility training;Therapeutic activities;Therapeutic exercise;Balance training;Patient/family education     PT Goals(Current goals can be found in the care plan section) Acute Rehab PT Goals Patient Stated Goal: To go home PT Goal Formulation: With patient Time For Goal Achievement: 04/20/13 Potential to Achieve Goals: Good  Visit Information  Last PT Received On: 04/06/13 Assistance Needed: +1 PT/OT Co-Evaluation/Treatment: Yes History of Present Illness: Alexandria [name] is a 79 y.o. female presenting with headache and back pain after a fall resulting from tripping--hit back of left side of head.PMH is significant for AFib on coumadin, CHF, implanted pacemaker, T2DM. x-ray a mild chronic T8 compression fracture, and a mild L1 compression fracture of indeterminate age. Appears to have BPPV. TLSO       Prior Functioning  Home Living Family/patient expects to be discharged to:: Private  residence Living Arrangements: Alone Available Help at  Discharge: Family;Available PRN/intermittently Type of Home: House Home Access: Level entry Home Layout: Two level;Laundry or work area in basement;Able to live on main level with bedroom/bathroom Home Equipment: Environmental consultant - 2 wheels;Wheelchair - manual;Cane - single point;Grab bars - toilet;Grab bars - tub/shower;Shower seat Additional Comments: Patient walks around house on driveway to get to basement from outside if needed. (Avoids stairs) Prior Function Level of Independence: Independent Communication Communication: No difficulties Dominant Hand: Right    Cognition  Cognition Arousal/Alertness: Awake/alert Behavior During Therapy: WFL for tasks assessed/performed Overall Cognitive Status: Within Functional Limits for tasks assessed    Extremity/Trunk Assessment Upper Extremity Assessment Upper Extremity Assessment: Defer to OT evaluation Lower Extremity Assessment Lower Extremity Assessment: Generalized weakness   Balance Balance Balance Assessed: Yes Static Sitting Balance Static Sitting - Balance Support: Bilateral upper extremity supported;Feet supported Static Sitting - Level of Assistance: 4: Min assist Static Sitting - Comment/# of Minutes: 2  End of Session PT - End of Session Equipment Utilized During Treatment: Gait belt;Oxygen Activity Tolerance: Patient limited by fatigue;Patient limited by pain Patient left: in chair;with call bell/phone within reach Nurse Communication: Mobility status       INGOLD,Emanual Lamountain 04/06/2013, 1:02 PM [facility] Acute Rehabilitation [phone] (pager)

## 2013-04-06 NOTE — Progress Notes (Signed)
Orthopedic Tech Progress Note Patient Details:  Alexandria [name] December 04, 1929 [id number]  Patient ID: Alexandria [name], female   DOB: 1930/05/19, 79 y.o.   MRN: [id number]   Alexandria [name] 04/06/2013, 10:29 AMCalled [employer] for TLSO brace.

## 2013-04-06 NOTE — Progress Notes (Signed)
Patient evaluated for community based chronic disease management services with [facility] Care Management Program as a benefit of patient's [employer]. Spoke with patient at bedside to explain [facility] Care Management services.  Patient will receive a post discharge transition of care call and will be evaluated for monthly home visits for assessments and disease process education. Patient cell number is [phone].  She admits to not weighing daily but has a scale.  Prior to admission she noted awakening frequently at night despite being on nocturnal oxygen.  She may benefit from a nocturnal oximetry study to assess her oxygen needs and rule out sleep apnea.  Patient has noted a change in appetite as well prior to admission.  Her A1C is currently 7.4.  She admits to not consistently eating at routine intervals at home.  We will engage her with an Building surveyor to assess these concerns and possible interventions.  Our initial engagement may be by home visit versus telephonic because she still drives and may not be eligible for home health.  Left contact information and [facility] literature at bedside. Made inpatient Case Manager aware that [facility] Care Management following. Of note, [facility] Care Management services does not replace or interfere with any services that are arranged by inpatient case management or social work.  For additional questions or referrals please contact Anibal Henderson BSN RN [facility] Liaison at [phone].

## 2013-04-06 NOTE — Progress Notes (Signed)
Inpatient Diabetes Program Recommendations  AACE/ADA: New Consensus Statement on Inpatient Glycemic Control (2013)  Target Ranges:  Prepandial:   less than 140 mg/dL      Peak postprandial:   less than 180 mg/dL (1-2 hours)      Critically ill patients:  140 - 180 mg/dL  Results for Alexandria [name], Alexandria [name] (MRN [id number]) as of 04/06/2013 14:26  Ref. Range 04/05/2013 17:11 04/06/2013 00:42 04/06/2013 06:05 04/06/2013 11:23  Glucose-Capillary Latest Range: 70-99 mg/dL 409 (H) 811 (H) 914 (H) 223 (H)   Inpatient Diabetes Program Recommendations Insulin - Basal: consider increasing Levemir (home dose 28 units per med rec.) Thank you  Piedad Climes BSN, RN,CDE Inpatient Diabetes Coordinator [phone] (team pager)

## 2013-04-06 NOTE — Progress Notes (Signed)
Utilization Review Completed.   Bay Jarquin, RN, BSN Nurse Case Manager  [phone]  

## 2013-04-06 NOTE — Progress Notes (Signed)
Family Medicine Teaching Service Daily Progress Note Intern Pager: [phone]  Patient name: Alexandria [name] Medical record number: [id number] Date of birth: [birth year] Age: 79 y.o. Gender: female  Primary Care Provider: [name], MD Consultants: Neurosurgery Code Status: Full  Pt Overview and Major Events to Date:  10/9: Admitted after fall, neurosurgery rec: symptomatic treatment 10/10: PT/OT to assess  Assessment and Plan: Alexandria [name] is a 79 y.o. female presenting with headache and back pain after a fall resulting from tripping. PMH is significant for AFib on coumadin, CHF, implanted pacemaker, T2DM.   # Fall with scalp hematoma and abrasion:  - CT scan negative for intracranial hemorrhage, so will continue coumadin - Neuro checks q2h overnight showing stable neuro status - PT/OT  - Tylenol 650mg  prn, vicodin 5-325mg  1-2tabs prn pain - Holding anti-emetic  - Hgb stable at 11.8  # L1 superior endplate compression fracture - seen on lumbar spine XR - NOT a candidate for MRI due to implanted pacemaker  - Neurosurgery recommends TLSO brace   Nursing to page ortho tech today - Intranasal calcitonin for analgesic adjunct  # AFib: currently in NSR, INR 2.36, pt with implanted pacemaker  - Continue home metoprolol 50mg  BID  - INR 2.15 today.  - Continue coumadin at 10mg  today  - (usual regimen: 7.5mg  daily with 10mg  on Mon and Fri)  - Telemetry   # CHF, diastolic, last Echo in 2013  - Continue lasix 80mg  BID  - Daily weight, strict I/O  # HTN:  - Continue metoprolol, norvasc   # T2DM: Home regimen: Levemir 28u qAM, novolog 12u with breakfast, lunch, and 8u with dinner.Hb A1c was 7.4 in September.  - CBG 4 times daily  - Levemir 14u qAM, Sensitive SSI. Plan to titrate after 24hr   # Interstitial lung disease: On 2L home O2 while sleeping only.  - Continue 2L O2 by [place], titrate prn to keep sats >90%   # Hypothyroidism: TSH wnl in ED  - Continue home synthroid.    # Social - Brother, Alexandria [name], at bedside. Can be reached at [phone].  - Per patient, he is medical decision maker  FEN/GI: Saline lock IV, Carb-modified diet  Prophylaxis: Continuing coumadin per MD  Disposition: Continued evaluation and management including PT/OT  Subjective: Denies back pain at rest, but severe pain still present with most movements. Did not get much sleep last night. Not hungry. Denies HA, CP, SOB, nausea  Objective: Temp:  [97.9 F (36.6 C)] 97.9 F (36.6 C) (10/10 0037) Pulse Rate:  [68-90] 81 (10/10 0037) Resp:  [20-30] 20 (10/10 0037) BP: (112-188)/(58-78) 178/76 mmHg (10/10 0037) SpO2:  [89 %-97 %] 95 % (10/10 0037) Weight:  [185 lb 4.8 oz (84.052 kg)] 185 lb 4.8 oz (84.052 kg) (10/10 0037) Physical Exam: General: Well-appearing 82-yo female laying on back in bed in NAD Cardiovascular: Regular rate and rhythm, S1 S2 wnl, no murmur, trace pitting edema bilateral LEs  Respiratory: non-labored, CTAB, nasal cannula in place Abdomen: Soft, NT, ND, normal BS Musculoskeletal: Unchanged point tenderness in lower back with paraspinal muscle spasm. Limited back flexion.   Laboratory:  Recent Labs Lab 04/05/13 1910 04/06/13 0538  WBC 11.8* 8.1  HGB 12.5 11.8*  HCT 38.8 34.5*  PLT 201 183    Recent Labs Lab 04/05/13 1910  NA 136  K 3.9  CL 96  CO2 27  BUN 14  CREATININE 0.68  CALCIUM 10.4  GLUCOSE 179*    Imaging/Diagnostic Tests:  CT HEAD  FINDINGS  The ventricles are normal in configuration. There is ventricular and  sulcal enlargement reflecting moderate atrophy.  There are no parenchymal masses or mass effect. There is extensive  white matter hypoattenuation most consistent with chronic  microvascular ischemic change. There is no evidence of a recent  cortical infarct.  There are no extra-axial masses or abnormal fluid collections.  There is no intracranial hemorrhage.  No skull fracture is seen. The visualized sinuses and  mastoid air  cells are clear.  There is a left parietal scalp hematoma with a fluid fluid level  along its posterior margin.  IMPRESSION:  1. No acute intracranial abnormalities. No intracranial hemorrhage.  2. Left parietal scalp hematoma. No skull fracture.   CT CERVICAL SPINE  FINDINGS  No fracture. No spondylolisthesis.  There are degenerative changes with loss of disc height and endplate  spurring most notably at C5-C6 and C6-C7. There are facet  degenerative changes. Facet joints are fused on the right at C2-C3  and C3-C4 and on the left at C2-C3.  The bones are extensively demineralized.  Soft tissues show prominent chronic calcifications but are otherwise  unremarkable.  The lung apices show chronic scarring.  IMPRESSION  1. No fracture or acute finding.  2. There are significant degenerative changes and diffuse bony  demineralization as described.   THORACIC SPINE XR  FINDINGS:  Chronic T8 compression fracture is present. There are no interval  new compression fractures identified. Upper thoracic spine poorly  seen due to bony overlap. Thoracolumbar junction degenerative disc  disease is present with vacuum disc and marginal osteophytes. Aortic  atherosclerosis. Small vessel MEDICATIONS visceral atherosclerosis.  Partially visualized 2 lead left subclavian cardiac pacemaker.  IMPRESSION:  No acute osseous abnormality. Chronic T8 compression fracture.   LUMBAR SPINE XR  FINDINGS:  Mild levoconvex lumbar curvature. Trace anterolisthesis of L3 on L4,  measuring 2 mm. Severe L4-L5 spondylosis is present with vacuum  disk. There is a superior endplate L1 compression fracture ; loss of  height is less than 25%. No radiographic evidence of retropulsion.  This level is only partially seen on a prior chest CT from 2011  however no endplate deformity was present at that time suggesting an  interval compression fracture. Strictly speaking, this is age  indeterminate.  Atherosclerosis.  IMPRESSION:  1. Age indeterminate L1 superior endplate compression fracture  without radiographic evidence of retropulsion. MRI a can be used to  assess for marrow edema and determine age of the fracture.  2. L4-L5 predominant lumbar spondylosis.   PELVIS XR  FINDINGS:  Obturator rings appear intact. No displaced femoral neck fracture.  Atherosclerosis. L4-L5 predominant lumbar spondylosis.  IMPRESSION:  No acute osseous abnormality.  Hazeline Junker, MD 04/06/2013, 6:28 AM PGY-1, [facility] Family Medicine FPTS Intern pager: [phone], text pages welcome

## 2013-04-06 NOTE — Evaluation (Signed)
Occupational Therapy Evaluation Patient Details Name: Alexandria [name] MRN: [id number] DOB: 10-14-1929 Today's Date: 04/06/2013 Time: 4098-1191 OT Time Calculation (min): 27 min  OT Assessment / Plan / Recommendation History of present illness Alexandria [name] is a 79 y.o. female presenting with headache and back pain after a fall resulting from tripping--hit back of left side of head.PMH is significant for AFib on coumadin, CHF, implanted pacemaker, T2DM. x-ray a mild chronic T8 compression fracture, and a mild L1 compression fracture of indeterminate age. Appears to have BPPV. TLSO   Clinical Impression   This 79 yo female admitted with above presents to acute OT with problems below and lives along. She repeats that she wants to go home and hire help (if she can hire 24/7 A until the MD lets her come out of the TLSO then this is feasible)--my recommendation is SNF.     OT Assessment  Patient needs continued OT Services    Follow Up Recommendations  SNF    Barriers to Discharge Decreased caregiver support    Equipment Recommendations  None recommended by OT    Recommendations for Other Services    Frequency  Min 2X/week    Precautions / Restrictions Precautions Precautions: Fall;Back Required Braces or Orthoses: Spinal Brace Spinal Brace: Thoracolumbosacral orthotic;Applied in supine position Restrictions Weight Bearing Restrictions: No   Pertinent Vitals/Pain 10/10 with movement; nausea with movement due to BPPV    ADL  Eating/Feeding: Independent Where Assessed - Eating/Feeding: Chair Grooming: Set up;Supervision/safety Where Assessed - Grooming: Supported sitting Upper Body Bathing: Supervision/safety;Set up Where Assessed - Upper Body Bathing: Supine, head of bed up Lower Body Bathing: +1 Total assistance Where Assessed - Lower Body Bathing: Supported sit to stand Upper Body Dressing: +1 Total assistance Where Assessed - Upper Body Dressing: Supine, head of bed  up;Supine, head of bed flat;Rolling right and/or left Lower Body Dressing: +1 Total assistance Where Assessed - Lower Body Dressing: Supported sit to Pharmacist, hospital: Moderate assistance Toilet Transfer Method: Stand pivot Toilet Transfer Equipment:  (Bed to chair next to bed) Toileting - Clothing Manipulation and Hygiene: +1 Total assistance Where Assessed - Toileting Clothing Manipulation and Hygiene: Sit to stand from 3-in-1 or toilet Equipment Used: Back brace Transfers/Ambulation Related to ADLs: Mod A for sit<>stand    OT Diagnosis: Generalized weakness;Acute pain  OT Problem List: Decreased strength;Decreased activity tolerance;Impaired balance (sitting and/or standing);Pain;Obesity;Decreased knowledge of precautions;Decreased knowledge of use of DME or AE OT Treatment Interventions: Self-care/ADL training;Balance training;DME and/or AE instruction;Patient/family education;Therapeutic activities   OT Goals(Current goals can be found in the care plan section) Acute Rehab OT Goals Patient Stated Goal: To go home OT Goal Formulation: With patient Time For Goal Achievement: 04/20/13 Potential to Achieve Goals: Good  Visit Information  Last OT Received On: 04/06/13 Assistance Needed: +1 PT/OT Co-Evaluation/Treatment: Yes History of Present Illness: Alexandria [name] is a 79 y.o. female presenting with headache and back pain after a fall resulting from tripping--hit back of left side of head.PMH is significant for AFib on coumadin, CHF, implanted pacemaker, T2DM. x-ray a mild chronic T8 compression fracture, and a mild L1 compression fracture of indeterminate age. Appears to have BPPV. TLSO       Prior Functioning     Home Living Family/patient expects to be discharged to:: Private residence Living Arrangements: Alone Available Help at Discharge: Family;Available PRN/intermittently Type of Home: House Home Access: Level entry Home Layout: Two level;Laundry or work area in  basement;Able to live on main level  with bedroom/bathroom Home Equipment: Walker - 2 wheels;Wheelchair - manual;Cane - single point;Grab bars - toilet;Grab bars - tub/shower;Shower seat Additional Comments: Patient walks around house on driveway to get to basement from outside if needed. (Avoids stairs) Prior Function Level of Independence: Independent Communication Communication: No difficulties Dominant Hand: Right         Vision/Perception Vision - History Baseline Vision: Wears glasses only for reading Patient Visual Report: No change from baseline   Cognition  Cognition Arousal/Alertness: Awake/alert Behavior During Therapy: WFL for tasks assessed/performed Overall Cognitive Status: Within Functional Limits for tasks assessed    Extremity/Trunk Assessment Upper Extremity Assessment Upper Extremity Assessment: Generalized weakness     Mobility Bed Mobility Bed Mobility: Rolling Right;Rolling Left;Left Sidelying to Sit;Sitting - Scoot to Edge of Bed Rolling Right: 4: Min assist;With rail Rolling Left: 4: Min assist;With rail Left Sidelying to Sit: 3: Mod assist;HOB flat;With rails Sitting - Scoot to Edge of Bed: 4: Min assist Details for Bed Mobility Assistance: VCs for sequence Transfers Transfers: Sit to Stand;Stand to Sit Sit to Stand: 4: Min assist;Without upper extremity assist;From bed Stand to Sit: 4: Min assist;With armrests;To chair/3-in-1;Without upper extremity assist           End of Session OT - End of Session Equipment Utilized During Treatment: Back brace Activity Tolerance: Patient limited by pain (limited by nausea) Patient left: in chair;with call bell/phone within reach Nurse Communication: Mobility status       Evette Georges [phone] 04/06/2013, 12:32 PM

## 2013-04-06 NOTE — Progress Notes (Signed)
INITIAL NUTRITION ASSESSMENT  DOCUMENTATION CODES Per approved criteria  -Obesity Unspecified   INTERVENTION: Provide Glucerna Shakes BID PRN Encourage adequate PO intake  NUTRITION DIAGNOSIS: Inadequate oral intake related to pain, nausea, poor appetite as evidenced by pt's report and untouched meal tray.   Goal: Pt to meet >/= 90% of their estimated nutrition needs   Monitor:  PO intake Weight Labs  Reason for Assessment: Malnutrition Screening Tool, score of 2  79 y.o. female  Admitting Dx: Compression fracture of L1 lumbar vertebra  ASSESSMENT: 79 y.o. female presenting with headache and back pain after a fall resulting from tripping. PMH is significant for AFib on coumadin, CHF, implanted pacemaker, T2DM.   Pt reports pain and nausea today causing a poor appetite and poor PO intake. Pt reports eating very little at breakfast and lunch tray was in front of pt untouched. Pt states she was eating well PTA; she usually eats a small breakfast, a snack, and a good dinner daily. She denies weight loss stating that she usually weighs 185 lbs. Pt states that primarily eats a vegetarian diet. Encouraged pt to eat 3 small meals daily and to incorporate all food groups. Pt willing to try Glucerna Shakes until appetite improves.   Height: Ht Readings from Last 1 Encounters:  04/06/13 5\' 3"  (1.6 m)    Weight: Wt Readings from Last 1 Encounters:  04/06/13 185 lb 4.8 oz (84.052 kg)    Ideal Body Weight: 115 lbs  % Ideal Body Weight: 161%  Wt Readings from Last 10 Encounters:  04/06/13 185 lb 4.8 oz (84.052 kg)  03/08/13 185 lb 6.4 oz (84.097 kg)  03/01/13 184 lb (83.462 kg)  02/07/13 189 lb (85.73 kg)  01/25/13 185 lb (83.915 kg)  12/25/12 187 lb (84.823 kg)  12/05/12 184 lb 9.6 oz (83.734 kg)  11/23/12 186 lb (84.369 kg)  10/25/12 182 lb 8 oz (82.781 kg)  09/13/12 182 lb (82.555 kg)    Usual Body Weight: 185 lbs  % Usual Body Weight: NA  BMI:  Body mass index is  32.83 kg/(m^2).  Estimated Nutritional Needs: Kcal: 1600-1800 Protein: 80-90 grams Fluid: 1.6-1.8 L/day  Skin: non-pitting RLE and LLE edema  Diet Order: Carb Control  EDUCATION NEEDS: -No education needs identified at this time   Intake/Output Summary (Last 24 hours) at 04/06/13 1419 Last data filed at 04/06/13 0917  Gross per 24 hour  Intake    240 ml  Output    401 ml  Net   -161 ml    Last BM: 10/9   Labs:   Recent Labs Lab 04/05/13 1910  NA 136  K 3.9  CL 96  CO2 27  BUN 14  CREATININE 0.68  CALCIUM 10.4  GLUCOSE 179*    CBG (last 3)   Recent Labs  04/06/13 0042 04/06/13 0605 04/06/13 1123  GLUCAP 190* 257* 223*    Scheduled Meds: . acetaminophen  650 mg Oral Q6H   Or  . acetaminophen  650 mg Rectal Q6H  . amLODipine  5 mg Oral Daily  . calcitonin (salmon)  1 spray Alternating Nares Daily  . furosemide  80 mg Oral BID  . insulin aspart  0-9 Units Subcutaneous TID WC  . insulin detemir  14 Units Subcutaneous q morning - 10a  . isosorbide mononitrate  60 mg Oral Daily  . latanoprost  1 drop Both Eyes QHS  . levothyroxine  50 mcg Oral QAC breakfast  . metoprolol  50 mg Oral BID  .  polyethylene glycol  17 g Oral Daily  . potassium chloride SA  20 mEq Oral BID  . senna  1 tablet Oral Daily  . sodium chloride  3 mL Intravenous Q12H  . warfarin  10 mg Oral ONCE-1800  . Warfarin - Physician Dosing Inpatient   Does not apply q1800    Continuous Infusions:   Past Medical History  Diagnosis Date  . Hypertension     with h/o hypertensive urgency  . Glaucoma   . Tremor, essential   . Diabetes mellitus with neuropathy   . B12 deficiency   . Hyperlipidemia   . Atrial fibrillation     a. on chronic anticoagulation with warfarin  b. Tiksosyn discontinued and amiodarone  was discontinued secondary to perceived symptoms  . Chronic diastolic heart failure     a. echo 05/2009: mild LVH, EF 55-60%, grade 2 diast dysfxn, mild LAE, PASP;   b. Echo  3/13:   mod LVH, EF 55-60%, mild to mod calc AV annulus, very mild AS (mean 9 mmHg)  . CAD (coronary artery disease)     a.  b. 07/2011 NSTEMI - PCI/DES LCX - Resolute stent;;   b. LHC 12/27/11: mLAD 30%, prox-mid D1 90% (small);  CFX patent stent, OM3 small 95%,, mRCA 30%, EF 65%  -  med Rx;   . Carotid stenosis     a. dopplers 6/12: 1-39% bilat ICA; b. carotid dopplers 7/13: bilat 1-39% ICA  . Pulmonary fibrosis     Dr. Vassie Loll;  patient taken off O2 in 8/12; dyspnea felt CHF>>ILD  . Skin cancer   . Colon polyps   . Syncope     unknown etiology  . Bradycardia   . Pacemaker 02/29/2012  . Diabetes mellitus     insulin controled  . Elevated TSH 05/2012  . Normocytic anemia 05/2012  . CHF (congestive heart failure)     Past Surgical History  Procedure Laterality Date  . Appendectomy    . Nose surgery    . Colonoscopy    . Cardiac catheterization  2013    stent  . Coronary angioplasty with stent placement    . Insert / replace / remove pacemaker  02/29/2012  . Dilation and curettage of uterus      Ian Malkin RD, LDN Inpatient Clinical Dietitian Pager: [phone] After Hours Pager: [phone]

## 2013-04-06 NOTE — H&P (Signed)
FMTS Attending Admission Note: Alexandria Rademaker,MD I  have seen and examined this patient, reviewed their chart. I have discussed this patient with the resident. I agree with the resident's findings, assessment and care plan.  Patient with hx of mechanical fall at home,she stated she tripped on d trash after her house keeping finished cleaning up,she fell on her back and banged her head,she denies any LOC,no headache,no change in her vision,no limb weakness. Now she is having back pain about 7/10 in severity,she can not lift herself up from the bed due to pain.  Filed Vitals:   04/05/13 1926 04/05/13 2107 04/06/13 0037 04/06/13 0640  BP: 168/78 188/78 178/76 136/53  Pulse: 89 90 81 64  Temp:   97.9 F (36.6 C) 97.9 F (36.6 C)  TempSrc:   Oral Oral  Resp: 22 22 20 20   Height:   5\' 3"  (1.6 m)   Weight:   185 lb 4.8 oz (84.052 kg)   SpO2: 90% 93% 95% 94%   Exam: Gen: Awake and alert,calm in bed,not in distress. HEENT: No Facial trauma, dry blood on her occiput with mild tenderness to palpation of her occiput.Did not appreciate major laceration.PERRLA,EOMI. Neuro: Intact. Resp: Air entry equal B/L. Heart. S1 S2,RRR,no murmurs. Abd: Benign. Ext: No edema. Back: Could not assess,patient can not sit up or roll over.  A/P: 78 y/o F with. 1. Mechanical fall:     CT head,cervical spine reviewed,no intracranial bleed.     Fall precaution.     PT to assist with ambulation.  2.Back pain/ L1 compression fracture: Conservative management with back brace.     PT/OP recommended.     Pain control as needed.  3. Afib: seem to be rate and rhythm control.     May restart Coumadin if no bleeding, blood on occiput looked dried up.     Monitor INR level.  4. DM/HTN/CHF: Home regimen.

## 2013-04-06 NOTE — Progress Notes (Signed)
Followed up with call to Ortho they had already contacted [employer] about TLSO Back brace. PT/OT on hold for eval until pt has back brace & off Bedrest.

## 2013-04-06 NOTE — Progress Notes (Signed)
FMTS Attending Note  The plan of care was discussed with the resident team. I agree with the assessment and plan as documented by the resident.   1. Scalp Hematoma - no evidence of subdural/epidural hematoma on CT head, hemoglobin stable, conservative management of hematoma 2. Acute/subacute L1 compression fracture - agree with brace 3. Chronic medical conditions stable  Donnella Sham MD

## 2013-04-07 DIAGNOSIS — S0003XA Contusion of scalp, initial encounter: Secondary | ICD-10-CM

## 2013-04-07 DIAGNOSIS — I5043 Acute on chronic combined systolic (congestive) and diastolic (congestive) heart failure: Secondary | ICD-10-CM

## 2013-04-07 LAB — CBC
HCT: 34.4 % — ABNORMAL LOW (ref 36.0–46.0)
Hemoglobin: 11.6 g/dL — ABNORMAL LOW (ref 12.0–15.0)
MCH: 28.7 pg (ref 26.0–34.0)
MCHC: 33.7 g/dL (ref 30.0–36.0)
MCV: 85.1 fL (ref 78.0–100.0)
RDW: 13.9 % (ref 11.5–15.5)

## 2013-04-07 LAB — PROTIME-INR: INR: 2.28 — ABNORMAL HIGH (ref 0.00–1.49)

## 2013-04-07 MED ORDER — WARFARIN SODIUM 7.5 MG PO TABS
7.5000 mg | ORAL_TABLET | Freq: Once | ORAL | Status: AC
  Administered 2013-04-07: 18:00:00 7.5 mg via ORAL
  Filled 2013-04-07: qty 1

## 2013-04-07 MED ORDER — INSULIN DETEMIR 100 UNIT/ML ~~LOC~~ SOLN
18.0000 [IU] | Freq: Every morning | SUBCUTANEOUS | Status: DC
  Administered 2013-04-07 – 2013-04-08 (×2): 18 [IU] via SUBCUTANEOUS
  Filled 2013-04-07 (×2): qty 0.18

## 2013-04-07 NOTE — Progress Notes (Signed)
Patient C/O back pain. Oxy IR given with some relief. Patient asleep.

## 2013-04-07 NOTE — Progress Notes (Signed)
The patient had some bouts of confusion overnight and complained of back pain.  Pain medication was given to her with some relief.

## 2013-04-07 NOTE — ED Provider Notes (Addendum)
Medical screening examination/treatment/procedure(s) were conducted as a shared visit with resident physician and myself.  I personally evaluated the patient during the encounter. I interpreted the ecg contemporaneously with the resident and agree with his documented interpretation.   I interviewed and examined the patient. Lungs are CTAB. Cardiac exam wnl. Abdomen soft.  Likely new L1 comp fx after fall. NSU recommending placing pt in TLSO. Will admit to medicine.   Junius Argyle, MD 04/07/13 1106  Junius Argyle, MD 04/12/13 [phone]

## 2013-04-07 NOTE — Progress Notes (Signed)
Physical Therapy Treatment Patient Details Name: Alexandria [name] MRN: [id number] DOB: [birth year] Today's Date: 04/07/2013 Time: 4098-1191 PT Time Calculation (min): 15 min  PT Assessment / Plan / Recommendation  History of Present Illness Alexandria [name] is a 79 y.o. female presenting with headache and back pain after a fall resulting from tripping--hit back of left side of head.PMH is significant for AFib on coumadin, CHF, implanted pacemaker, T2DM. x-ray a mild chronic T8 compression fracture, and a mild L1 compression fracture of indeterminate age. Appears to have BPPV. TLSO   PT Comments   Patient making progress with mobility and gait today.  No c/o dizziness occurring today or during treatment.  Patient reports she has had "lightheadedness" since her heart trouble.  Will continue to assess vestibular system along with functional mobility.  Agree with need for SNF at discharge for continued therapy.  Follow Up Recommendations  SNF;Supervision/Assistance - 24 hour     Does the patient have the potential to tolerate intense rehabilitation     Barriers to Discharge        Equipment Recommendations  None recommended by PT    Recommendations for Other Services    Frequency Min 5X/week   Progress towards PT Goals Progress towards PT goals: Progressing toward goals  Plan Current plan remains appropriate    Precautions / Restrictions Precautions Precautions: Fall;Back Required Braces or Orthoses: Spinal Brace Spinal Brace: Thoracolumbosacral orthotic;Applied in supine position Restrictions Weight Bearing Restrictions: No   Pertinent Vitals/Pain Back pain continues to limit mobility.    Mobility  Bed Mobility Bed Mobility: Rolling Right;Rolling Left;Left Sidelying to Sit;Sitting - Scoot to Edge of Bed Rolling Right: 4: Min assist;With rail Rolling Left: 4: Min assist;With rail Left Sidelying to Sit: 3: Mod assist;HOB flat;With rails Sitting - Scoot to Edge of Bed: 4: Min  assist Details for Bed Mobility Assistance: Verbal cues for technique/sequencing for rolling to don back brace.  No nystagmus or dizziness during rolling.  Total assist to don brace in supine.  Assist to raise trunk from sidelying position. Transfers Transfers: Sit to Stand;Stand to Sit Sit to Stand: 4: Min assist;From bed Stand to Sit: 4: Min assist;With upper extremity assist;With armrests;To chair/3-in-1 Details for Transfer Assistance: Verbal cues for hand placement.  Patient without dizziness during mobility.  Good balance in standing with RW. Ambulation/Gait Ambulation/Gait Assistance: 4: Min assist Ambulation Distance (Feet): 24 Feet Assistive device: Rolling walker Ambulation/Gait Assistance Details: Verbal cues for safe use of RW and to stand upright during gait.   Gait Pattern: Step-through pattern;Decreased stride length Gait velocity: Slow gait speed      PT Goals (current goals can now be found in the care plan section)    Visit Information  Last PT Received On: 04/07/13 Assistance Needed: +1 History of Present Illness: Alexandria [name] is a 79 y.o. female presenting with headache and back pain after a fall resulting from tripping--hit back of left side of head.PMH is significant for AFib on coumadin, CHF, implanted pacemaker, T2DM. x-ray a mild chronic T8 compression fracture, and a mild L1 compression fracture of indeterminate age. Appears to have BPPV. TLSO    Subjective Data  Subjective: "Eila doesn't like to hurt"  "I've had this "lightheadedness" since my heart trouble."   Cognition  Cognition Arousal/Alertness: Awake/alert Behavior During Therapy: WFL for tasks assessed/performed Overall Cognitive Status: Within Functional Limits for tasks assessed (Difficulty problem solving (how to lower [facility])) Memory: Decreased recall of precautions    Balance  End of Session PT - End of Session Equipment Utilized During Treatment: Gait belt;Back brace Activity  Tolerance: Patient limited by pain;Patient limited by fatigue Patient left: in chair;with call bell/phone within reach Nurse Communication: Mobility status   GP     Vena Austria 04/07/2013, 4:39 PM Durenda Hurt. Renaldo Fiddler, [facility] Acute Rehab Services Pager [phone]

## 2013-04-07 NOTE — Progress Notes (Signed)
FMTS Attending Note  I personally saw and evaluated the patient. The plan of care was discussed with the resident team. I agree with the assessment and plan as documented by the resident.   1. Scalp Hematoma s/p mechanical fall - healing, no evidence of intracranial bleed on CT head, normal neurologic exam 2. Acute vs subacute L1 compression fracture -  Continue PT/OT and TLSO brace, patient likely not able to perform home ADL's due to pain, may need short SNF stay however patient would like to go home, appreciate SW help with placement 3. Afib - stable, INR therapeutic 4. IDT2DM - agree with insulin changes as documented by the resident 5. Other chronic medical conditions stable.   Donnella Sham MD

## 2013-04-07 NOTE — Discharge Summary (Signed)
Family Medicine Teaching [facility] Discharge Summary  Patient name: Alexandria [name] Medical record number: [id number] Date of birth: [birth year] Age: 79 y.o. Gender: female Date of Admission: 04/05/2013  Date of Discharge: 04/11/2013 Admitting Physician: Janit Pagan, MD  Primary Care Provider: [name], MD Consultants: Neurosurgery  Indication for Hospitalization: Trip and fall on back/head  Discharge Diagnoses/Problem List:  Compression fracture of L1 vertebra Scalp hematoma AFib  T2DM Hyperlipidemia HTN Combined chronic systolic, diastolic heart failure  Disposition: To SNF  Discharge Condition: Stable and improved  Discharge Exam:  General: no acute distress, laying flat in bed  Cardiovascular: Regular rate, S1S2, no murmur appreciated  Respiratory: non-labored, CTAB Abdomen: soft, non tender, non distended  MSK: Good strength bilaterally, sensation intact in bilateral feet. + Point tenderness in midline of L spine. Pain with back flexion limiting ROM.   Brief Hospital Course:  Alexandria [name] is a 79 y.o. female presenting with headache and back pain after a fall resulting from tripping. PMH is significant for AFib on coumadin, CHF, implanted pacemaker, T2DM. A compression fracture of the L1 vertebrae was found on CT and neurosurgery was consulted. Consultant recommended symptomatic management with TLSO brace. Patient is not a candidate for MRI due to implanted pacemaker. INR was therapeutic at presentation and head CT showed no intracranial hemorrhage or scalp fracture. Coumadin was continued at home dosing and INR became supratherapeutic on that dose schedule. Coumadin was held 10/13 and restarted on 7.5mg  daily. The patient remained stable at frequent neuro assessments. PT/OT sessions showed improvement in ambulation distance every day while inpatient and their recommendation was continued PT at [facility]. Upright AP and lateral XR of the thoracolumbar spine was obtained  prior to discharge and showed some progression of the L1 fracture to height loss estimated at 30%.   Her other chronic medical conditions were treated with continued home medications and they remained stable.   Patient named her brother, Lynita Lombard, as medical decision maker if she lost capacity to make decisions. He can be reached at [phone].   Issues for Follow Up:  1. Follow up progress with physical therapy and evaluate for continued need for supervision/assistance.  2. Follow up INR. Pt discharged on 7.5mg  coumadin daily. INR 2.73 on 10/14.  3. Reconsider alternative angiotensin receptor blocker, as patient had cough with olmesartan, for survival benefit in T2DM.   Significant Procedures: None  Significant Labs and Imaging:   Recent Labs Lab 04/08/13 0444 04/09/13 0416 04/10/13 0410  WBC 10.3 7.5 9.4  HGB 12.4 12.2 12.8  HCT 36.1 35.8* 37.3  PLT 195 183 197    Recent Labs Lab 04/05/13 1910 04/09/13 0416  NA 136 133*  K 3.9 4.0  CL 96 93*  CO2 27 30  GLUCOSE 179* 274*  BUN 14 20  CREATININE 0.68 0.73  CALCIUM 10.4 10.0   CT HEAD  FINDINGS  The ventricles are normal in configuration. There is ventricular and  sulcal enlargement reflecting moderate atrophy.  There are no parenchymal masses or mass effect. There is extensive  white matter hypoattenuation most consistent with chronic  microvascular ischemic change. There is no evidence of a recent  cortical infarct.  There are no extra-axial masses or abnormal fluid collections.  There is no intracranial hemorrhage.  No skull fracture is seen. The visualized sinuses and mastoid air  cells are clear.  There is a left parietal scalp hematoma with a fluid fluid level  along its posterior margin.  IMPRESSION:  1. No  acute intracranial abnormalities. No intracranial hemorrhage.  2. Left parietal scalp hematoma. No skull fracture.  CT CERVICAL SPINE  FINDINGS  No fracture. No spondylolisthesis.  There are  degenerative changes with loss of disc height and endplate  spurring most notably at C5-C6 and C6-C7. There are facet  degenerative changes. Facet joints are fused on the right at C2-C3  and C3-C4 and on the left at C2-C3.  The bones are extensively demineralized.  Soft tissues show prominent chronic calcifications but are otherwise  unremarkable.  The lung apices show chronic scarring.  IMPRESSION  1. No fracture or acute finding.  2. There are significant degenerative changes and diffuse bony  demineralization as described.  THORACIC SPINE XR  FINDINGS:  Chronic T8 compression fracture is present. There are no interval  new compression fractures identified. Upper thoracic spine poorly  seen due to bony overlap. Thoracolumbar junction degenerative disc  disease is present with vacuum disc and marginal osteophytes. Aortic  atherosclerosis. Small vessel MEDICATIONS visceral atherosclerosis.  Partially visualized 2 lead left subclavian cardiac pacemaker.  IMPRESSION:  No acute osseous abnormality. Chronic T8 compression fracture.  LUMBAR SPINE XR  FINDINGS:  Mild levoconvex lumbar curvature. Trace anterolisthesis of L3 on L4,  measuring 2 mm. Severe L4-L5 spondylosis is present with vacuum  disk. There is a superior endplate L1 compression fracture ; loss of  height is less than 25%. No radiographic evidence of retropulsion.  This level is only partially seen on a prior chest CT from 2011  however no endplate deformity was present at that time suggesting an  interval compression fracture. Strictly speaking, this is age  indeterminate. Atherosclerosis.  IMPRESSION:  1. Age indeterminate L1 superior endplate compression fracture  without radiographic evidence of retropulsion. MRI a can be used to  assess for marrow edema and determine age of the fracture.  2. L4-L5 predominant lumbar spondylosis.  PELVIS XR  FINDINGS:  Obturator rings appear intact. No displaced femoral neck  fracture.  Atherosclerosis. L4-L5 predominant lumbar spondylosis.  IMPRESSION:  No acute osseous abnormality.  ERECT AP & LATERAL THORACOLUMBAR SPINE XR 10/14 COMPARISON: Plain films lumbar spine 04/05/2013.  FINDINGS:  L1 superior endplate compression fracture is again identified and  show some progressive vertebral body height loss, now estimated at  30%. No new fracture is identified. Multilevel degenerative change  and extensive atherosclerotic vascular disease are noted.  IMPRESSION:  Some progression of vertebral body height loss at L1 now estimated  at 30%.  Results/Tests Pending at Time of Discharge: None  Discharge Medications:    Medication List    ASK your doctor about these medications       amLODipine 5 MG tablet  Commonly known as:  NORVASC  Take 5 mg by mouth daily.     BESIVANCE 0.6 % Susp  Generic drug:  Besifloxacin HCl  Place 1 drop into the right eye See admin instructions. Use the evening of and day after eye injecton     fish oil-omega-3 fatty acids 1000 MG capsule  Take 1 g by mouth at bedtime.     furosemide 80 MG tablet  Commonly known as:  LASIX  Take 1 tablet (80 mg total) by mouth 2 (two) times daily.     HEMATINIC PLUS VIT/MINERALS 106-1 MG Tabs  Take 1 tablet by mouth daily.     insulin aspart 100 UNIT/ML injection  Commonly known as:  NOVOLOG FLEXPEN  Inject 10 Units into the skin 3 (three) times daily before meals. Inject  10 units before breakfast and lunch and 8 units before supper     insulin detemir 100 UNIT/ML injection  Commonly known as:  LEVEMIR  Inject 0.28 mLs (28 Units total) into the skin every morning.     isosorbide mononitrate 60 MG 24 hr tablet  Commonly known as:  IMDUR  Take 60 mg by mouth daily.     latanoprost 0.005 % ophthalmic solution  Commonly known as:  XALATAN  Place 1 drop into both eyes at bedtime.     levothyroxine 50 MCG tablet  Commonly known as:  SYNTHROID, LEVOTHROID  Take 50 mcg by mouth daily  before breakfast.     magnesium oxide 400 MG tablet  Commonly known as:  MAG-OX  Take 800 mg by mouth 2 (two) times daily.     metFORMIN 1000 MG tablet  Commonly known as:  GLUCOPHAGE  TAKE ONE TABLET BY MOUTH TWICE A DAY WITH MEALS     metFORMIN 1000 MG tablet  Commonly known as:  GLUCOPHAGE  Take 1,000 mg by mouth 2 (two) times daily with a meal.     metoprolol 50 MG tablet  Commonly known as:  LOPRESSOR  Take 50 mg by mouth 2 (two) times daily.     nitroGLYCERIN 0.4 MG SL tablet  Commonly known as:  NITROSTAT  Place 0.4 mg under the tongue every 5 (five) minutes as needed. As needed for chest pain     potassium chloride SA 20 MEQ tablet  Commonly known as:  K-DUR,KLOR-CON  Take 20 mEq by mouth 2 (two) times daily.     SM CRANBERRY 300 MG tablet  Generic drug:  Cranberry  Take 300 mg by mouth daily.     warfarin 5 MG tablet  Commonly known as:  COUMADIN  - Take 5 mg by mouth every evening. Takes 10mg  on Mon's and Fri's only  - Takes 7.5mg  all other days       Discharge Instructions: Please refer to Patient Instructions section of EMR for full details.  Patient was counseled important signs and symptoms that should prompt return to medical care, changes in medications, dietary instructions, activity restrictions, and follow up appointments.   Follow-Up Appointments: Follow-up Information   Follow up with [name], MD.   Specialty:  [facility] Medicine   Contact information:   [address] [place] [address] [phone]      Hazeline Junker, MD 04/10/2013, 3:54 PM PGY-1, [facility] Family Medicine

## 2013-04-07 NOTE — Progress Notes (Signed)
Family Medicine Teaching Service Daily Progress Note Intern Pager: [phone]  Patient name: Alexandria [name] Medical record number: [id number] Date of birth: 03/11/1930 Age: 79 y.o. Gender: female  Primary Care Provider: [name], MD Consultants: Neurosurgery Code Status: Full  Pt Overview and Major Events to Date:  10/9: Admitted after fall, neurosurgery rec: symptomatic treatment 10/10: TLSO brace applied  Assessment and Plan: Alexandria [name] is a 79 y.o. female presenting with headache and back pain after a fall resulting from tripping. PMH is significant for AFib on coumadin, CHF, implanted pacemaker, T2DM.   # L1 superior endplate compression fracture - Continue wearing TLSO brace when out of bed - PT/OT rec: SNF, 24hr supervision - CSW consulted - NOT a candidate for MRI due to implanted pacemaker - Intranasal calcitonin for analgesic adjunct  # AFib: currently in NSR - Continue home metoprolol 50mg  BID  - INR 2.28 today.  - Continue coumadin at 7.5mg  today  - (usual regimen: 7.5mg  daily with 10mg  on Mon and Fri)  - Telemetry   # T2DM: Home regimen: Levemir 28u qAM, novolog 12u with breakfast, lunch, and 8u with dinner.Hb A1c was 7.4 in September.  - CBG 4 times daily  - Required 8u SSI/24hrs; Lowest CBG 182 - Increase Levemir 14 -> 18u qAM, Continue sensitive SSI  # Fall with scalp hematoma and abrasion:  - CT scan negative for intracranial hemorrhage, so will continue coumadin - Stable neuro status - Tylenol 650mg  prn, vicodin 5-325mg  1-2tabs prn pain - Holding anti-emetic  - Hgb stable at 11.6  # CHF, diastolic, last Echo in 2013  - Continue lasix 80mg  BID  - Up 1.8 kg since admission ?scale discrepancy, continue to monitor - Daily weight, strict I/O  # HTN:  - Continue metoprolol, norvasc   # Interstitial lung disease: On 2L home O2 while sleeping only.  - Continue 2L O2 by [place], titrate prn to keep sats >90%   # Hypothyroidism: TSH wnl in ED  -  Continue home synthroid.   # Social - Brother, Alexandria [name], at bedside. Can be reached at [phone].  - Per patient, he is medical decision maker  FEN/GI: Saline lock IV, Carb-modified diet  Prophylaxis: Continuing coumadin per MD  Disposition: Continued evaluation and management, CSW consulted for placement  Subjective: Denies back pain at rest, but was able to sit in chair with TLSO for 2 hours yesterday. Feels weak when standing. No HA, dizziness, CP, SOB.   Objective: Temp:  [97.8 F (36.6 C)-98.3 F (36.8 C)] 97.8 F (36.6 C) (10/11 0441) Pulse Rate:  [61-75] 74 (10/11 0441) Resp:  [18] 18 (10/11 0441) BP: (109-174)/(51-72) 149/53 mmHg (10/11 0441) SpO2:  [93 %-100 %] 98 % (10/11 0441) Weight:  [189 lb 3.2 oz (85.821 kg)] 189 lb 3.2 oz (85.821 kg) (10/11 0441) Physical Exam: General: Well-appearing 82-yo female laying on back in bed in NAD Cardiovascular: Regular rate and rhythm, S1 S2 wnl, no murmur, trace pitting edema bilateral LEs  Respiratory: non-labored, CTAB, nasal cannula in place Abdomen: Soft, NT, ND, normal BS Musculoskeletal: Tenderness in lower back with paraspinal muscle spasm.  Laboratory:  Recent Labs Lab 04/05/13 1910 04/06/13 0538 04/07/13 0620  WBC 11.8* 8.1 9.1  HGB 12.5 11.8* 11.6*  HCT 38.8 34.5* 34.4*  PLT 201 183 183    Recent Labs Lab 04/05/13 1910  NA 136  K 3.9  CL 96  CO2 27  BUN 14  CREATININE 0.68  CALCIUM 10.4  GLUCOSE 179*  Imaging/Diagnostic Tests:  CT HEAD  FINDINGS  The ventricles are normal in configuration. There is ventricular and  sulcal enlargement reflecting moderate atrophy.  There are no parenchymal masses or mass effect. There is extensive  white matter hypoattenuation most consistent with chronic  microvascular ischemic change. There is no evidence of a recent  cortical infarct.  There are no extra-axial masses or abnormal fluid collections.  There is no intracranial hemorrhage.  No skull  fracture is seen. The visualized sinuses and mastoid air  cells are clear.  There is a left parietal scalp hematoma with a fluid fluid level  along its posterior margin.  IMPRESSION:  1. No acute intracranial abnormalities. No intracranial hemorrhage.  2. Left parietal scalp hematoma. No skull fracture.   CT CERVICAL SPINE  FINDINGS  No fracture. No spondylolisthesis.  There are degenerative changes with loss of disc height and endplate  spurring most notably at C5-C6 and C6-C7. There are facet  degenerative changes. Facet joints are fused on the right at C2-C3  and C3-C4 and on the left at C2-C3.  The bones are extensively demineralized.  Soft tissues show prominent chronic calcifications but are otherwise  unremarkable.  The lung apices show chronic scarring.  IMPRESSION  1. No fracture or acute finding.  2. There are significant degenerative changes and diffuse bony  demineralization as described.   THORACIC SPINE XR  FINDINGS:  Chronic T8 compression fracture is present. There are no interval  new compression fractures identified. Upper thoracic spine poorly  seen due to bony overlap. Thoracolumbar junction degenerative disc  disease is present with vacuum disc and marginal osteophytes. Aortic  atherosclerosis. Small vessel MEDICATIONS visceral atherosclerosis.  Partially visualized 2 lead left subclavian cardiac pacemaker.  IMPRESSION:  No acute osseous abnormality. Chronic T8 compression fracture.   LUMBAR SPINE XR  FINDINGS:  Mild levoconvex lumbar curvature. Trace anterolisthesis of L3 on L4,  measuring 2 mm. Severe L4-L5 spondylosis is present with vacuum  disk. There is a superior endplate L1 compression fracture ; loss of  height is less than 25%. No radiographic evidence of retropulsion.  This level is only partially seen on a prior chest CT from 2011  however no endplate deformity was present at that time suggesting an  interval compression fracture. Strictly  speaking, this is age  indeterminate. Atherosclerosis.  IMPRESSION:  1. Age indeterminate L1 superior endplate compression fracture  without radiographic evidence of retropulsion. MRI a can be used to  assess for marrow edema and determine age of the fracture.  2. L4-L5 predominant lumbar spondylosis.   PELVIS XR  FINDINGS:  Obturator rings appear intact. No displaced femoral neck fracture.  Atherosclerosis. L4-L5 predominant lumbar spondylosis.  IMPRESSION:  No acute osseous abnormality.  Hazeline Junker, MD 04/07/2013, 8:50 AM PGY-1, [facility] Family Medicine FPTS Intern pager: [phone], text pages welcome

## 2013-04-08 LAB — GLUCOSE, CAPILLARY
Glucose-Capillary: 159 mg/dL — ABNORMAL HIGH (ref 70–99)
Glucose-Capillary: 190 mg/dL — ABNORMAL HIGH (ref 70–99)
Glucose-Capillary: 279 mg/dL — ABNORMAL HIGH (ref 70–99)

## 2013-04-08 LAB — CBC
Hemoglobin: 12.4 g/dL (ref 12.0–15.0)
MCH: 28.9 pg (ref 26.0–34.0)
Platelets: 195 10*3/uL (ref 150–400)
RBC: 4.29 MIL/uL (ref 3.87–5.11)
RDW: 13.8 % (ref 11.5–15.5)

## 2013-04-08 LAB — PROTIME-INR
INR: 3.15 — ABNORMAL HIGH (ref 0.00–1.49)
Prothrombin Time: 31.2 seconds — ABNORMAL HIGH (ref 11.6–15.2)

## 2013-04-08 MED ORDER — WARFARIN SODIUM 7.5 MG PO TABS
7.5000 mg | ORAL_TABLET | Freq: Once | ORAL | Status: DC
  Filled 2013-04-08: qty 1

## 2013-04-08 MED ORDER — WARFARIN SODIUM 5 MG PO TABS
5.0000 mg | ORAL_TABLET | Freq: Once | ORAL | Status: AC
  Administered 2013-04-08: 5 mg via ORAL
  Filled 2013-04-08: qty 1

## 2013-04-08 MED ORDER — INSULIN DETEMIR 100 UNIT/ML ~~LOC~~ SOLN
20.0000 [IU] | Freq: Every morning | SUBCUTANEOUS | Status: DC
  Administered 2013-04-09: 10:00:00 20 [IU] via SUBCUTANEOUS
  Filled 2013-04-08: qty 0.2

## 2013-04-08 NOTE — Progress Notes (Signed)
FMTS Attending Note  I personally saw and evaluated the patient. The plan of care was discussed with the resident team. I agree with the assessment and plan as documented by the resident.   1. L1 acute vs. subacute compression fracture - continue TLSO brace, initiated intranasal calcitonin 2 days ago, pain control with oxycodone prn, appreciate PT help with this patient, she is open to SNF placement, SW/case management helping to set up transfer to SNF in next 24 hours 2. Afib - INR supratherapeutic, will decrease one of her 10 mg doses (Mon/Fri) to 7.5 mg, repeat INR tomorrow 3. IDT2DM - management per resident note 4. Scalp hematoma s/p mechanical fall, improving, no active bleeding  Anticipate Discharge to SNF in next 24 hours.  Donnella Sham MD

## 2013-04-08 NOTE — Progress Notes (Signed)
Patient ID: Alexandria [name], female   DOB: February 04, 1930, 79 y.o.   MRN: [id number] BP 131/52  Pulse 74  Temp(Src) 97.4 F (36.3 C) (Oral)  Resp 18  Ht 5\' 3"  (1.6 m)  Wt 83.915 kg (185 lb)  BMI 32.78 kg/m2  SpO2 100% Alert and oriented x 4 Moving lower extremities well Neuro intact, brace in room. May ask [employer] to refit if the brace continues to be a problem

## 2013-04-08 NOTE — Progress Notes (Signed)
Physical Therapy Treatment Patient Details Name: Alexandria [name] MRN: [id number] DOB: 17-Sep-1929 Today's Date: 04/08/2013 Time: 6578-4696 PT Time Calculation (min): 25 min  PT Assessment / Plan / Recommendation  History of Present Illness Alexandria [name] is a 79 y.o. female presenting with headache and back pain after a fall resulting from tripping--hit back of left side of head.PMH is significant for AFib on coumadin, CHF, implanted pacemaker, T2DM. x-ray a mild chronic T8 compression fracture, and a mild L1 compression fracture of indeterminate age. Appears to have BPPV. TLSO   PT Comments   Patient making slow progress with mobility/gait.  Follow Up Recommendations  SNF;Supervision/Assistance - 24 hour     Does the patient have the potential to tolerate intense rehabilitation     Barriers to Discharge        Equipment Recommendations  None recommended by PT    Recommendations for Other Services    Frequency Min 5X/week   Progress towards PT Goals Progress towards PT goals: Progressing toward goals  Plan Current plan remains appropriate    Precautions / Restrictions Precautions Precautions: Fall;Back Required Braces or Orthoses: Spinal Brace Spinal Brace: Thoracolumbosacral orthotic;Applied in supine position (Provided education to nursing re: brace management) Restrictions Weight Bearing Restrictions: No   Pertinent Vitals/Pain     Mobility  Bed Mobility Bed Mobility: Rolling Right;Rolling Left;Left Sidelying to Sit;Sitting - Scoot to Edge of Bed Rolling Right: 4: Min guard;With rail Rolling Left: 4: Min guard;With rail Left Sidelying to Sit: 4: Min assist;With rails Sitting - Scoot to Edge of Bed: 4: Min guard Details for Bed Mobility Assistance: Provided education to nursing re: donning/doffing brace.  Verbal reminders re: technique.  Patient able to perform mobility with increased time. Transfers Transfers: Sit to Stand;Stand to Sit Sit to Stand: 4: Min  assist;From bed Stand to Sit: 4: Min assist;With upper extremity assist;With armrests;To chair/3-in-1 Details for Transfer Assistance: Verbal cues for hand placement.  Patient without dizziness during mobility.  Good balance in standing with RW. Ambulation/Gait Ambulation/Gait Assistance: 4: Min assist Ambulation Distance (Feet): 38 Feet Assistive device: Rolling walker Ambulation/Gait Assistance Details: Verbal cues to stand upright.  Safe use of RW Gait Pattern: Step-through pattern;Decreased stride length Gait velocity: Slow gait speed      PT Goals (current goals can now be found in the care plan section)    Visit Information  Last PT Received On: 04/08/13 Assistance Needed: +1 History of Present Illness: Alexandria [name] is a 79 y.o. female presenting with headache and back pain after a fall resulting from tripping--hit back of left side of head.PMH is significant for AFib on coumadin, CHF, implanted pacemaker, T2DM. x-ray a mild chronic T8 compression fracture, and a mild L1 compression fracture of indeterminate age. Appears to have BPPV. TLSO    Subjective Data  Subjective: Patient with less c/o today.  Reports no dizziness today.   Cognition  Cognition Arousal/Alertness: Awake/alert Behavior During Therapy: WFL for tasks assessed/performed Overall Cognitive Status: Within Functional Limits for tasks assessed    Balance     End of Session PT - End of Session Equipment Utilized During Treatment: Gait belt;Back brace Activity Tolerance: Patient limited by pain;Patient limited by fatigue Patient left: in chair;with call bell/phone within reach Nurse Communication: Mobility status Fish farm manager education (Nurse Tech))   GP     Vena Austria 04/08/2013, 3:34 PM Durenda Hurt. Renaldo Fiddler, [facility] Acute Rehab Services Pager [phone]

## 2013-04-08 NOTE — Progress Notes (Signed)
Family Medicine Teaching Service Daily Progress Note Intern Pager: [phone]  Patient name: Alexandria [name] Medical record number: [id number] Date of birth: 11-30-1929 Age: 79 y.o. Gender: female  Primary Care Provider: [name], MD Consultants: neurosurgery Code Status: Full  Pt Overview and Major Events to Date:  10/9: Admitted after fall, neurosurgery rec: symptomatic treatment  10/10: TLSO brace applied  Assessment and Plan:  Alexandria [name] is a 79 y.o. female presenting with headache and back pain after a fall resulting from tripping. PMH is significant for AFib on coumadin, CHF, implanted pacemaker, T2DM.   # L1 superior endplate compression fracture  - Continue wearing TLSO brace when out of bed  - PT/OT rec: SNF, 24hr supervision: which patient has agreed upon after discussion with her brothers - CSW consulted  - NOT a candidate for MRI due to implanted pacemaker  - Intranasal calcitonin for analgesic adjunct  - oxycodone 5mg  q4/prn - scheduled tylenol   # AFib: currently in NSR  - Continue home metoprolol 50mg  BID  - supratherapeutic INR at 3.15 - consider decreased dose to 5mg  from 7.5mg  with repeat INR tomorrow - (usual regimen: 7.5mg  daily with 10mg  on Mon and Fri)  - Telemetry   # T2DM: Home regimen: Levemir 28u qAM, novolog 12u with breakfast, lunch, and 8u with dinner.Hb A1c was 7.4 in September.  - CBG 4 times daily  - Required 10u SSI/24hrs; Lowest CBG 159  - Increase Levemir 18u to 20units Continue sensitive SSI   # Fall with scalp hematoma and abrasion:  - CT scan negative for intracranial hemorrhage, so will continue coumadin  - Stable neuro status  - Tylenol 650mg  prn, oxycodone - Holding anti-emetic  - Hgb stable at 12.4  # CHF, diastolic, last Echo in 2013  - Continue lasix 80mg  BID (home dose) - weight: 185lb from 189lbs on 04/07/13  - Daily weight, strict I/O   # HTN:  - Continue metoprolol, norvasc   # Interstitial lung disease: On 2L  home O2 while sleeping only.  - Continue 2L O2 by [place], titrate prn to keep sats >90%   # Hypothyroidism: TSH wnl in ED  - Continue home synthroid.   # Social  - Brother, Lynita Lombard, at bedside. Can be reached at [phone].  - Per patient, he is medical decision maker  FEN/GI: Saline lock IV, Carb-modified diet  Prophylaxis: Continuing coumadin per MD  Disposition: SNF: consult social work for SNF placement  Subjective: low back pain with ambulation and movement. Has been ambulating with walker with PT but otherwise, not ambulating on her own.  Eating well. No chest pain or shortness of breath.   Objective: Temp:  [97.9 F (36.6 C)-98.4 F (36.9 C)] 98 F (36.7 C) (10/12 0551) Pulse Rate:  [70-75] 71 (10/11 2030) Resp:  [18] 18 (10/12 0551) BP: (125-154)/(51-58) 125/53 mmHg (10/12 0551) SpO2:  [94 %-98 %] 95 % (10/12 0551) Weight:  [185 lb (83.915 kg)] 185 lb (83.915 kg) (10/12 0551) Physical Exam: General: no acute distress, laying flat in bed Cardiovascular: RRR, S1S2, no murmur appreciated Respiratory: CTA on anterior exam Abdomen: soft, non tender, non distended Extremities: 4+strength with hip flexion and knee extension and plantarflexion and dorsiflexion bilaterally  Laboratory:  Recent Labs Lab 04/06/13 0538 04/07/13 0620 04/08/13 0444  WBC 8.1 9.1 10.3  HGB 11.8* 11.6* 12.4  HCT 34.5* 34.4* 36.1  PLT 183 183 195    Recent Labs Lab 04/05/13 1910  NA 136  K 3.9  CL 96  CO2 27  BUN 14  CREATININE 0.68  CALCIUM 10.4  GLUCOSE 179*   INR: 3.15 Imaging/Diagnostic Tests:  CT HEAD IMPRESSION: 04/05/13 1. No acute intracranial abnormalities. No intracranial hemorrhage.  2. Left parietal scalp hematoma. No skull fracture.  CT CERVICAL SPINE IMPRESSION  1. No fracture or acute finding.  2. There are significant degenerative changes and diffuse bony  demineralization as described.   CT HEAD FINDINGS 04/05/13 The ventricles are normal in  configuration. There is ventricular and  sulcal enlargement reflecting moderate atrophy.  There are no parenchymal masses or mass effect. There is extensive  white matter hypoattenuation most consistent with chronic  microvascular ischemic change. There is no evidence of a recent  cortical infarct.  There are no extra-axial masses or abnormal fluid collections.  There is no intracranial hemorrhage.  No skull fracture is seen. The visualized sinuses and mastoid air  cells are clear.  There is a left parietal scalp hematoma with a fluid fluid level  along its posterior margin.  CT CERVICAL SPINE FINDINGS  No fracture. No spondylolisthesis.  There are degenerative changes with loss of disc height and endplate  spurring most notably at C5-C6 and C6-C7. There are facet  degenerative changes. Facet joints are fused on the right at C2-C3  and C3-C4 and on the left at C2-C3.  The bones are extensively demineralized.  Soft tissues show prominent chronic calcifications but are otherwise  unremarkable.  The lung apices show chronic scarring.  IMPRESSION:  CT HEAD IMPRESSION  1. No acute intracranial abnormalities. No intracranial hemorrhage.  2. Left parietal scalp hematoma. No skull fracture.  CT CERVICAL SPINE IMPRESSION  1. No fracture or acute finding.  2. There are significant degenerative changes and diffuse bony  demineralization as described.   Alexandria Skinner, MD 04/08/2013, 10:41 AM PGY-3, [place] [facility] FPTS Intern pager: [phone], text pages welcome

## 2013-04-09 ENCOUNTER — Ambulatory Visit: Payer: Medicare Other

## 2013-04-09 LAB — CBC
HCT: 35.8 % — ABNORMAL LOW (ref 36.0–46.0)
Hemoglobin: 12.2 g/dL (ref 12.0–15.0)
MCH: 28.8 pg (ref 26.0–34.0)
MCHC: 34.1 g/dL (ref 30.0–36.0)
MCV: 84.4 fL (ref 78.0–100.0)
Platelets: 183 10*3/uL (ref 150–400)
RBC: 4.24 MIL/uL (ref 3.87–5.11)
RDW: 13.9 % (ref 11.5–15.5)
WBC: 7.5 10*3/uL (ref 4.0–10.5)

## 2013-04-09 LAB — PROTIME-INR: INR: 3.63 — ABNORMAL HIGH (ref 0.00–1.49)

## 2013-04-09 LAB — GLUCOSE, CAPILLARY
Glucose-Capillary: 272 mg/dL — ABNORMAL HIGH (ref 70–99)
Glucose-Capillary: 234 mg/dL — ABNORMAL HIGH (ref 70–99)
Glucose-Capillary: 273 mg/dL — ABNORMAL HIGH (ref 70–99)

## 2013-04-09 LAB — BASIC METABOLIC PANEL
BUN: 20 mg/dL (ref 6–23)
CO2: 30 mEq/L (ref 19–32)
Calcium: 10 mg/dL (ref 8.4–10.5)
Creatinine, Ser: 0.73 mg/dL (ref 0.50–1.10)
Glucose, Bld: 274 mg/dL — ABNORMAL HIGH (ref 70–99)
Potassium: 4 mEq/L (ref 3.5–5.1)

## 2013-04-09 MED ORDER — INSULIN DETEMIR 100 UNIT/ML ~~LOC~~ SOLN
24.0000 [IU] | Freq: Every morning | SUBCUTANEOUS | Status: DC
  Administered 2013-04-10: 10:00:00 24 [IU] via SUBCUTANEOUS
  Filled 2013-04-09: qty 0.24

## 2013-04-09 NOTE — Clinical Social Work Psychosocial (Signed)
    Clinical Social Work Department BRIEF PSYCHOSOCIAL ASSESSMENT 04/09/2013  Patient:  Alexandria [name], Alexandria [name]     Account Number:  [id number]     Admit date:  04/05/2013  Clinical Social Worker:  Gretta Cool, Chiropodist CSW  Date/Time:  04/09/2013 11:15 AM  Referred by:  Physician  Date Referred:  04/09/2013 Referred for  SNF Placement   Other Referral:   Interview type:  Patient Other interview type:    PSYCHOSOCIAL DATA Living Status:  ALONE Admitted from facility:   Level of care:   Primary support name:  Conley Rolls Primary support relationship to patient:  SIBLING Degree of support available:   Limited support due to living alone but has a nephew and brother that live near by.    CURRENT CONCERNS  Other Concerns:    SOCIAL WORK ASSESSMENT / PLAN Alexandria [name] is an 88 year who lives alone in her private residence. Based on her current condition, she has agreed to a skilled nursing facility search in [place] and [place]. We will return later with skilled facility bed offers.   Assessment/plan status:  Information/Referral to [facility] Other assessment/ plan:   Information/referral to community resources:   skilled facility list    PATIENT'S/FAMILY'S RESPONSE TO PLAN OF CARE: Alexandria [name] is motivated for short term rehab and is adament about returning home when she is stable to do so.

## 2013-04-09 NOTE — Progress Notes (Signed)
Family Medicine Teaching Service Attending Note  I interviewed and examined patient Alexandria [name] and reviewed their tests and x-rays.  I discussed with Dr. Jarvis Newcomer and reviewed their note for today.  I agree with their assessment and plan.     Additionally  Feeling better Up walking with Physical Therapy Investigate rehab continue ambulation

## 2013-04-09 NOTE — Progress Notes (Signed)
Inpatient Diabetes Program Recommendations  AACE/ADA: New Consensus Statement on Inpatient Glycemic Control (2013)  Target Ranges:  Prepandial:   less than 140 mg/dL      Peak postprandial:   less than 180 mg/dL (1-2 hours)      Critically ill patients:  140 - 180 mg/dL   Reason for Assessment:  Sub-optimal glycemic control   Inpatient Diabetes Program Recommendations Insulin - Basal: Note Levemir dosage increased to 24 units every morning.  (Home dose 28 units) Insulin - Meal Coverage: Eating 100% most meals.  At home takes Novolog 10 units tid.  Please consider starting Novolog meal coverage 3 to 4 units tid in addition to correction scale with titration as indicated.     Note:  Results for OAKLEE, ESTHER (MRN [id number]) as of 04/09/2013 11:55  Ref. Range 04/08/2013 06:47 04/08/2013 11:19 04/08/2013 16:33 04/08/2013 22:11 04/09/2013 06:33 04/09/2013 11:02  Glucose-Capillary Latest Range: 70-99 mg/dL 409 (H) 811 (H) 914 (H) 193 (H) 272 (H) 234 (H)   Request meal coverage 3 to 4 units Novolog tid with meals in addition to correction scale to be given if patient eats at least 50% of meal and CBG > 80 mg/dl.     Thank you.  Eduardo Honor S. Elsie Lincoln, RN, CNS, CDE Inpatient Diabetes Program, team pager [phone]

## 2013-04-09 NOTE — Progress Notes (Signed)
Family Medicine Teaching Service Daily Progress Note Intern Pager: [phone]  Patient name: Alexandria [name] Medical record number: [id number] Date of birth: [birth year] Age: 79 y.o. Gender: female  Primary Care Provider: [name], MD Consultants: neurosurgery Code Status: Full  Pt Overview and Major Events to Date:  10/9: Admitted after fall, neurosurgery rec: symptomatic treatment  10/10: TLSO brace applied 10/11: PT rec: SNF with 24 hr assistance  Assessment and Plan:  Alexandria [name] is a 79 y.o. female presenting with headache and back pain after a fall resulting from tripping. PMH is significant for AFib on coumadin, CHF, implanted pacemaker, T2DM.   # L1 superior endplate compression fracture  - Continue wearing TLSO brace when out of bed  - PT/OT rec: SNF, 24hr supervision: which patient has agreed upon after discussion with her brothers - CSW consulted  - NOT a candidate for MRI due to implanted pacemaker  - Intranasal calcitonin for analgesic adjunct  - oxycodone 5mg  q4h prn RN to offer at time of PT - scheduled tylenol   # AFib: currently in NSR  - Continue home metoprolol 50mg  BID  - supratherapeutic INR at 3.63, hold coumadin today - repeat INR tomorrow - (usual regimen: 7.5mg  daily with 10mg  on Mon and Fri)  - Telemetry   # T2DM: Home regimen: Levemir 28u qAM, novolog 12u with breakfast, lunch, and 8u with dinner.Hb A1c was 7.4 in September.  - CBG 4 times daily  - Required 10u SSI/24hrs; Lowest CBG 159  - Increase Levemir to 24 units Continue sensitive SSI   # Fall with scalp hematoma and abrasion:  - CT scan negative for intracranial hemorrhage, so will continue coumadin  - Stable neuro status  - Tylenol 650mg  prn, oxycodone - Holding anti-emetic  - Hgb stable at 12.4  # CHF, diastolic, last Echo in 2013  - Continue lasix 80mg  BID (home dose) - weight: 185lb from 189lbs on 04/07/13  - Daily weight, strict I/O   # HTN:  - Continue metoprolol,  norvasc   # Interstitial lung disease: On 2L home O2 while sleeping only.  - Continue 2L O2 by [place], titrate prn to keep sats >90%   # Hypothyroidism: TSH wnl in ED  - Continue home synthroid.   # Social  - Brother, Lynita Lombard, at bedside. Can be reached at [phone].  - Per patient, he is medical decision maker  FEN/GI: Saline lock IV, Carb-modified diet  Prophylaxis: Continuing coumadin per MD  Disposition: SNF: consult social work for SNF placement  Subjective: low back pain with ambulation and movement. Has been ambulating with walker with PT but otherwise, not ambulating on her own.  Eating well. No chest pain or shortness of breath.   Objective: Temp:  [97.4 F (36.3 C)-98.1 F (36.7 C)] 98.1 F (36.7 C) (10/13 0448) Pulse Rate:  [69-74] 71 (10/13 0933) Resp:  [18-20] 20 (10/13 0448) BP: (131-132)/(50-52) 131/50 mmHg (10/13 0932) SpO2:  [97 %-100 %] 97 % (10/13 0448) Weight:  [186 lb 1.1 oz (84.4 kg)] 186 lb 1.1 oz (84.4 kg) (10/13 0448) Physical Exam: General: no acute distress, laying flat in bed Cardiovascular: RRR, S1S2, no murmur appreciated Respiratory: CTA on anterior exam Abdomen: soft, non tender, non distended Extremities: 4+strength with hip flexion and knee extension and plantarflexion and dorsiflexion bilaterally  Laboratory:  Recent Labs Lab 04/07/13 0620 04/08/13 0444 04/09/13 0416  WBC 9.1 10.3 7.5  HGB 11.6* 12.4 12.2  HCT 34.4* 36.1 35.8*  PLT 183 195 183  Recent Labs Lab 04/05/13 1910 04/09/13 0416  NA 136 133*  K 3.9 4.0  CL 96 93*  CO2 27 30  BUN 14 20  CREATININE 0.68 0.73  CALCIUM 10.4 10.0  GLUCOSE 179* 274*   INR: 3.15 Imaging/Diagnostic Tests:  CT HEAD IMPRESSION: 04/05/13 1. No acute intracranial abnormalities. No intracranial hemorrhage.  2. Left parietal scalp hematoma. No skull fracture.  CT CERVICAL SPINE IMPRESSION  1. No fracture or acute finding.  2. There are significant degenerative changes and diffuse  bony  demineralization as described.   CT HEAD FINDINGS 04/05/13 The ventricles are normal in configuration. There is ventricular and  sulcal enlargement reflecting moderate atrophy.  There are no parenchymal masses or mass effect. There is extensive  white matter hypoattenuation most consistent with chronic  microvascular ischemic change. There is no evidence of a recent  cortical infarct.  There are no extra-axial masses or abnormal fluid collections.  There is no intracranial hemorrhage.  No skull fracture is seen. The visualized sinuses and mastoid air  cells are clear.  There is a left parietal scalp hematoma with a fluid fluid level  along its posterior margin.  CT CERVICAL SPINE FINDINGS  No fracture. No spondylolisthesis.  There are degenerative changes with loss of disc height and endplate  spurring most notably at C5-C6 and C6-C7. There are facet  degenerative changes. Facet joints are fused on the right at C2-C3  and C3-C4 and on the left at C2-C3.  The bones are extensively demineralized.  Soft tissues show prominent chronic calcifications but are otherwise  unremarkable.  The lung apices show chronic scarring.  IMPRESSION:  CT HEAD IMPRESSION  1. No acute intracranial abnormalities. No intracranial hemorrhage.  2. Left parietal scalp hematoma. No skull fracture.  CT CERVICAL SPINE IMPRESSION  1. No fracture or acute finding.  2. There are significant degenerative changes and diffuse bony  demineralization as described.   Hazeline Junker, MD 04/09/2013, 9:54 AM PGY-3, [place] [facility] FPTS Intern pager: [phone], text pages welcome

## 2013-04-09 NOTE — Progress Notes (Signed)
Filed Vitals:   04/08/13 1330 04/09/13 0448 04/09/13 0932 04/09/13 0933  BP: 131/52 132/50 131/50   Pulse: 74 69  71  Temp: 97.4 F (36.3 C) 98.1 F (36.7 C)    TempSrc: Oral Oral    Resp: 18 20    Height:      Weight:  84.4 kg (186 lb 1.1 oz)    SpO2: 100% 97%      CBC  Recent Labs  04/08/13 0444 04/09/13 0416  WBC 10.3 7.5  HGB 12.4 12.2  HCT 36.1 35.8*  PLT 195 183   BMET  Recent Labs  04/09/13 0416  NA 133*  K 4.0  CL 93*  CO2 30  GLUCOSE 274*  BUN 20  CREATININE 0.73  CALCIUM 10.0    Patient resting in bed, comfortable. Has been working with physical therapy on transfers and mobility. Donning and doffing TLSO in bed. Exam shows 5/5 strength in lower extremities.  Plan: Continue PT and rehabilitation. Will need upright AP and lateral thoracolumbar junction x-rays to reassess L1 fracture prior to discharge.  Hewitt Shorts, MD 04/09/2013, 1:36 PM

## 2013-04-09 NOTE — Progress Notes (Signed)
Physical Therapy Treatment Patient Details Name: Alexandria [name] MRN: [id number] DOB: 05/30/1930 Today's Date: 04/09/2013 Time: 7829-5621 PT Time Calculation (min): 26 min  PT Assessment / Plan / Recommendation  History of Present Illness Alexandria [name] is a 79 y.o. female presenting with headache and back pain after a fall resulting from tripping--hit back of left side of head.PMH is significant for AFib on coumadin, CHF, implanted pacemaker, T2DM. x-ray a mild chronic T8 compression fracture, and a mild L1 compression fracture of indeterminate age. Appears to have BPPV. TLSO   PT Comments   Pt admitted with above. Pt currently with functional limitations due to the deficits listed below (see PT Problem List). Pt still needs SNF placement.  Decreased frequency from 5x to 4x week due to pt SNF placement and not appropriate for 5x week.  Pt is progressing.   Pt will benefit from skilled PT to increase their independence and safety with mobility to allow discharge to the venue listed below.   Follow Up Recommendations  SNF;Supervision/Assistance - 24 hour                 Equipment Recommendations  None recommended by PT        Frequency Min 4X/week   Progress towards PT Goals Progress towards PT goals: Progressing toward goals  Plan Frequency needs to be updated    Precautions / Restrictions Precautions Precautions: Fall;Back Required Braces or Orthoses: Spinal Brace Spinal Brace: Thoracolumbosacral orthotic;Applied in supine position Restrictions Weight Bearing Restrictions: No   Pertinent Vitals/Pain VSS, Some back pain    Mobility  Bed Mobility Bed Mobility: Rolling Right;Rolling Left;Left Sidelying to Sit;Sitting - Scoot to Edge of Bed Rolling Right: 4: Min guard;With rail Rolling Left: 4: Min guard;With rail Left Sidelying to Sit: 4: Min assist;With rails Sitting - Scoot to Edge of Bed: 4: Min guard Details for Bed Mobility Assistance:  Verbal reminders re: log roll  technique.  Patient able to perform mobility with increased time. Transfers Transfers: Sit to Stand;Stand to Sit Sit to Stand: 4: Min assist;From bed Stand to Sit: 4: Min assist;With upper extremity assist;With armrests;To chair/3-in-1 Details for Transfer Assistance: Verbal cues for hand placement.  Patient without dizziness during mobility.  Good balance in standing with RW. Ambulation/Gait Ambulation/Gait Assistance: 4: Min assist Ambulation Distance (Feet): 58 Feet Assistive device: Rolling walker Ambulation/Gait Assistance Details: Pt needed cues to stay close to RW but safe use overall.   Gait Pattern: Step-through pattern;Decreased stride length Gait velocity: Slow gait speed Stairs: No Wheelchair Mobility Wheelchair Mobility: No     PT Goals (current goals can now be found in the care plan section)    Visit Information  Last PT Received On: 04/09/13 Assistance Needed: +1 History of Present Illness: Alexandria [name] is a 79 y.o. female presenting with headache and back pain after a fall resulting from tripping--hit back of left side of head.PMH is significant for AFib on coumadin, CHF, implanted pacemaker, T2DM. x-ray a mild chronic T8 compression fracture, and a mild L1 compression fracture of indeterminate age. Appears to have BPPV. TLSO    Subjective Data  Subjective: "I still just hurt when I move."   Cognition  Cognition Arousal/Alertness: Awake/alert Behavior During Therapy: WFL for tasks assessed/performed Overall Cognitive Status: Within Functional Limits for tasks assessed    Balance  Balance Balance Assessed: Yes Static Sitting Balance Static Sitting - Balance Support: Bilateral upper extremity supported;Feet supported Static Sitting - Level of Assistance: 4: Min assist Static Sitting -  Comment/# of Minutes: 3  End of Session PT - End of Session Equipment Utilized During Treatment: Gait belt;Back brace Activity Tolerance: Patient limited by pain;Patient  limited by fatigue Patient left: in chair;with call bell/phone within reach Nurse Communication: Mobility status        INGOLD,Myrian Botello 04/09/2013, 12:43 PM  [facility] Acute Rehabilitation [phone] (pager)

## 2013-04-09 NOTE — Clinical Social Work Placement (Addendum)
     Clinical Social Work Department CLINICAL SOCIAL WORK PLACEMENT NOTE 04/11/2013  Patient:  Alexandria [name], Alexandria [name]  Account Number:  [id number] Admit date:  04/05/2013  Clinical Social Worker:  Gretta Cool, Chiropodist CSW  Date/time:  04/09/2013 11:15 AM  Clinical Social Work is seeking post-discharge placement for this patient at the following level of care:   SKILLED NURSING   (*CSW will update this form in Epic as items are completed)   04/09/2013  Patient/family provided with [facility] Department of Clinical Social Works list of facilities offering this level of care within the geographic area requested by the patient (or if unable, by the patients family).  04/09/2013  Patient/family informed of their freedom to choose among providers that offer the needed level of care, that participate in Medicare, Medicaid or managed care program needed by the patient, have an available bed and are willing to accept the patient.  04/09/2013  Patient/family informed of [facility] ownership interest in [facility], as well as of the fact that they are under no obligation to receive care at this facility.  PASARR submitted to EDS on 04/09/2013 PASARR number received from EDS on   FL2 transmitted to all facilities in geographic area requested by pt/family on  04/09/2013 FL2 transmitted to all facilities within larger geographic area on   Patient informed that his/her managed care company has contracts with or will negotiate with  certain facilities, including the following:     Patient/family informed of bed offers received:  04/11/2013 Patient chooses bed at [facility] Physician recommends and patient chooses bed at    Patient to be transferred to [facility] on  04/11/2013 Patient to be transferred to facility by Ambulance  Sharin Mons)  The following physician request were entered in Epic:   Additional Comments: 04/11/13  OK  per MD for d/c today to SNF.  Patient was rather confused today- and was having a difficult understanding about the need for SNF placement (she agreed yesterday). Patient's neice is very invovled and stated that patient has been having many episodes of confusion at home in the recent past and she has had to intervene. Patient has agreed to SNF placement but neice anticipates a long ter care arrangement. CSW notified admissions at [facility]  and the stated this is ok and will accept patient. Nursing notified to call report. No further CSW needs identified.  Lorri Frederick. Zaylin Runco, LCSWA  [phone].

## 2013-04-10 ENCOUNTER — Inpatient Hospital Stay (HOSPITAL_COMMUNITY): Payer: Medicare Other

## 2013-04-10 LAB — GLUCOSE, CAPILLARY
Glucose-Capillary: 293 mg/dL — ABNORMAL HIGH (ref 70–99)
Glucose-Capillary: 276 mg/dL — ABNORMAL HIGH (ref 70–99)

## 2013-04-10 LAB — PROTIME-INR: Prothrombin Time: 28 seconds — ABNORMAL HIGH (ref 11.6–15.2)

## 2013-04-10 LAB — CBC
HCT: 37.3 % (ref 36.0–46.0)
Hemoglobin: 12.8 g/dL (ref 12.0–15.0)
MCH: 29 pg (ref 26.0–34.0)
MCV: 84.4 fL (ref 78.0–100.0)
RBC: 4.42 MIL/uL (ref 3.87–5.11)
WBC: 9.4 10*3/uL (ref 4.0–10.5)

## 2013-04-10 MED ORDER — INSULIN DETEMIR 100 UNIT/ML ~~LOC~~ SOLN
28.0000 [IU] | Freq: Every morning | SUBCUTANEOUS | Status: DC
  Administered 2013-04-11: 11:00:00 28 [IU] via SUBCUTANEOUS
  Filled 2013-04-10: qty 0.28

## 2013-04-10 MED ORDER — WARFARIN SODIUM 7.5 MG PO TABS
7.5000 mg | ORAL_TABLET | Freq: Every day | ORAL | Status: DC
  Administered 2013-04-10: 7.5 mg via ORAL
  Filled 2013-04-10 (×3): qty 1

## 2013-04-10 NOTE — Progress Notes (Signed)
Noted pt's piv  leaking and requested to leave iv out if not need.  Dr. Allison Quarry notified and instructed it was ok to leave iv out.  Pt made aware.  Amanda Pea, Charity fundraiser.

## 2013-04-10 NOTE — Progress Notes (Signed)
CSW met with patient and her nephew Josslin Sanjuan  (c) [phone]. Patient was given bed offers from [facility] and she is considering them. She is now requesting referral to [facility], [place] which has a swing bed unit. Patient's nephew stated that there is family in this area and that they are familiar with the care and services of this facility.  CSW spoke with facility's admissions director and referral sent. Awaiting possible bed offer. Per MD- patient will be medically stable for dc to SNF tomorrow. CSW will follow up in the a.m.  Lorri Frederick. [place]  [phone]

## 2013-04-10 NOTE — Progress Notes (Signed)
Family Medicine Teaching Service Attending Note  I interviewed and examined patient Alexandria [name] and reviewed their tests and x-rays.  I discussed with Dr. Jarvis Newcomer and reviewed their note for today.  I agree with their assessment and plan.     Additionally  Feeling better Ambulating with Physical Therapy Stable for discharge

## 2013-04-10 NOTE — Progress Notes (Signed)
The patient did have a bout of confusion this morning when lab came in to draw her blood and was calling out into the hallway.  She was reoriented to her situation and seemed to understand.  She complained of back pain overnight, but it was relieved with Tylenol and Oxy IR and she was able to sleep.  Otherwise, the patient did not have any other changes overnight.

## 2013-04-10 NOTE — Progress Notes (Signed)
Physical Therapy Treatment Patient Details Name: Alexandria [name] MRN: [id number] DOB: 1930-03-05 Today's Date: 04/10/2013 Time: 4098-1191 PT Time Calculation (min): 25 min  PT Assessment / Plan / Recommendation  History of Present Illness Alexandria [name] is a 79 y.o. female presenting with headache and back pain after a fall resulting from tripping--hit back of left side of head.PMH is significant for AFib on coumadin, CHF, implanted pacemaker, T2DM. x-ray a mild chronic T8 compression fracture, and a mild L1 compression fracture of indeterminate age. Appears to have BPPV. TLSO   PT Comments   Pt admitted with above. Pt currently with functional limitations due to continued balance and endurance deficits as well as pain.    Pt will benefit from skilled PT to increase their independence and safety with mobility to allow discharge to the venue listed below.  Follow Up Recommendations  SNF;Supervision/Assistance - 24 hour                 Equipment Recommendations  None recommended by PT        Frequency Min 4X/week   Progress towards PT Goals Progress towards PT goals: Progressing toward goals  Plan Current plan remains appropriate    Precautions / Restrictions Precautions Precautions: Fall;Back Required Braces or Orthoses: Spinal Brace Spinal Brace: Thoracolumbosacral orthotic;Applied in supine position Restrictions Weight Bearing Restrictions: No   Pertinent Vitals/Pain VSS, some back pain with movement    Mobility  Bed Mobility Bed Mobility: Rolling Right;Rolling Left;Left Sidelying to Sit;Sitting - Scoot to Edge of Bed Rolling Right: 4: Min guard;With rail Rolling Left: 4: Min guard;With rail Left Sidelying to Sit: 4: Min assist;With rails Sitting - Scoot to Edge of Bed: 4: Min guard Details for Bed Mobility Assistance:  Verbal reminders re: log roll technique.  Patient able to perform mobility with increased time. Transfers Transfers: Sit to Stand;Stand to Sit Sit to  Stand: 4: Min assist;From bed Stand to Sit: 4: Min assist;With upper extremity assist;With armrests;To chair/3-in-1 Stand Pivot Transfers: Not tested (comment) Details for Transfer Assistance: Verbal cues for hand placement.  Patient without dizziness during mobility.  Good balance in standing with RW. Ambulation/Gait Ambulation/Gait Assistance: 4: Min assist Ambulation Distance (Feet): 66 Feet Assistive device: Rolling walker Ambulation/Gait Assistance Details: verbal cues for walker safety and upright posture. Gait Pattern: Step-through pattern;Decreased stride length Gait velocity: Slow gait speed Stairs: No Wheelchair Mobility Wheelchair Mobility: No    Exercises General Exercises - Lower Extremity Ankle Circles/Pumps: AROM;Both;10 reps;Seated Long Arc Quad: AROM;Both;5 reps;Seated Hip Flexion/Marching: AROM;10 reps;Both;Seated   PT Goals (current goals can now be found in the care plan section)    Visit Information  Last PT Received On: 04/10/13 Assistance Needed: +1 History of Present Illness: Alexandria [name] is a 79 y.o. female presenting with headache and back pain after a fall resulting from tripping--hit back of left side of head.PMH is significant for AFib on coumadin, CHF, implanted pacemaker, T2DM. x-ray a mild chronic T8 compression fracture, and a mild L1 compression fracture of indeterminate age. Appears to have BPPV. TLSO    Subjective Data  Subjective: "Oh it hurts to move."   Cognition  Cognition Arousal/Alertness: Awake/alert Behavior During Therapy: WFL for tasks assessed/performed Overall Cognitive Status: Within Functional Limits for tasks assessed    Balance  Static Sitting Balance Static Sitting - Balance Support: Bilateral upper extremity supported;Feet supported Static Sitting - Level of Assistance: 5: Stand by assistance Static Sitting - Comment/# of Minutes: 2 Static Standing Balance Static Standing -  Balance Support: No upper extremity  supported;During functional activity Static Standing - Level of Assistance: 4: Min assist Static Standing - Comment/# of Minutes: stood at sink and combed hair  one LOB needing min assist when pt had no UE support.  End of Session PT - End of Session Equipment Utilized During Treatment: Gait belt;Back brace Activity Tolerance: Patient limited by pain;Patient limited by fatigue Patient left: in chair;with call bell/phone within reach Nurse Communication: Mobility status        INGOLD,Cornie Herrington 04/10/2013, 1:52 PM [facility] Acute Rehabilitation [phone] (pager)

## 2013-04-10 NOTE — Progress Notes (Signed)
Occupational Therapy Treatment Patient Details Name: Alexandria [name] MRN: [id number] DOB: 11/15/1929 Today's Date: 04/10/2013 Time: 4098-1191 OT Time Calculation (min): 18 min  OT Assessment / Plan / Recommendation  History of present illness Alexandria [name] is a 79 y.o. female presenting with headache and back pain after a fall resulting from tripping--hit back of left side of head.PMH is significant for AFib on coumadin, CHF, implanted pacemaker, T2DM. x-ray a mild chronic T8 compression fracture, and a mild L1 compression fracture of indeterminate age. Appears to have BPPV. TLSO   OT comments  Pt sitting in chair on arrival verbalizing feeling hot and painful. Pt assisted back to bed to help with bed mobility and brace education. Pt with fair return demo at this point and time. Pt will need (A) for d/c planning. Recommend SNf  Follow Up Recommendations  SNF    Barriers to Discharge       Equipment Recommendations  None recommended by OT    Recommendations for Other Services    Frequency Min 2X/week   Progress towards OT Goals Progress towards OT goals: Progressing toward goals  Plan Discharge plan remains appropriate    Precautions / Restrictions Precautions Precautions: Fall;Back Required Braces or Orthoses: Spinal Brace Spinal Brace: Thoracolumbosacral orthotic;Applied in supine position Restrictions Weight Bearing Restrictions: No   Pertinent Vitals/Pain No pain supine    ADL  Equipment Used: Gait belt;Rolling walker;Back brace ADL Comments: Pt educated on bed mobility with brace. Pt completed chair to standing position inside RW. Pt ambulated to eob . pt educated on sit<>side position. Pt was unable to complete and needed total (A) to complete transfer. pT educated that brace is to be doff supine unhooking one side and rolling to have whole braced removed. Pt provided this education to help with caregiver care for d/c snf. Pt with poor recall of this information from  previous sessions. Pt self talking the entire session "Alexandria [name] what are you going to do old girl" " you go to get it together Alexandria [name]"    OT Diagnosis:    OT Problem List:   OT Treatment Interventions:     OT Goals(current goals can now be found in the care plan section) Acute Rehab OT Goals Patient Stated Goal: To go home OT Goal Formulation: With patient Time For Goal Achievement: 04/20/13 Potential to Achieve Goals: Good ADL Goals Pt Will Perform Lower Body Dressing: with min assist;with adaptive equipment;sit to/from stand Pt Will Transfer to Toilet: with min assist;ambulating;bedside commode Additional ADL Goal #1: Pt will be able to direct caregiver in how to appy her TLSO supine Additional ADL Goal #2: Pt will be S for in and OOB with TLSO with HOB flat and use of rail  Visit Information  Last OT Received On: 04/10/13 Assistance Needed: +1 History of Present Illness: Alexandria [name] is a 79 y.o. female presenting with headache and back pain after a fall resulting from tripping--hit back of left side of head.PMH is significant for AFib on coumadin, CHF, implanted pacemaker, T2DM. x-ray a mild chronic T8 compression fracture, and a mild L1 compression fracture of indeterminate age. Appears to have BPPV. TLSO    Subjective Data      Prior Functioning       Cognition  Cognition Arousal/Alertness: Awake/alert Behavior During Therapy: WFL for tasks assessed/performed Overall Cognitive Status: Within Functional Limits for tasks assessed    Mobility  Bed Mobility Bed Mobility: Rolling Right;Rolling Left;Left Sidelying to Sit;Sitting - Scoot to [place] of Bed  Rolling Right: 4: Min guard Rolling Left: 4: Min guard Left Sidelying to Sit: 4: Min assist;With rails Sitting - Scoot to [place] of Bed: 4: Min guard Details for Bed Mobility Assistance: pt log roll rt and lt with bed pan to void bladder during session. pt total (A) for hygiene Transfers Transfers: Sit to Stand;Stand to Sit Sit  to Stand: 4: Min assist;With upper extremity assist;From chair/3-in-1 Stand to Sit: 4: Min assist;With upper extremity assist;To bed Details for Transfer Assistance: Verbal cues for hand placement.  Patient without dizziness during mobility.  Good balance in standing with RW.    Exercises  General Exercises - Lower Extremity Ankle Circles/Pumps: AROM;Both;10 reps;Seated Long Arc Quad: AROM;Both;5 reps;Seated Hip Flexion/Marching: AROM;10 reps;Both;Seated   Balance Static Sitting Balance Static Sitting - Balance Support: Bilateral upper extremity supported;Feet supported Static Sitting - Level of Assistance: 5: Stand by assistance Static Sitting - Comment/# of Minutes: 2 Static Standing Balance Static Standing - Balance Support: No upper extremity supported;During functional activity Static Standing - Level of Assistance: 4: Min assist Static Standing - Comment/# of Minutes: stood at sink and combed hair  one LOB needing min assist when pt had no UE support.   End of Session OT - End of Session Activity Tolerance: Patient tolerated treatment well Patient left: in bed;with call bell/phone within reach Nurse Communication: Mobility status;Precautions  GO     Alexandria [name] 04/10/2013, 3:07 PM Pager: [phone]

## 2013-04-10 NOTE — Progress Notes (Signed)
Family Medicine Teaching Service Daily Progress Note Intern Pager: [phone]  Patient name: Alexandria [name] Medical record number: [id number] Date of birth: 01-Apr-1930 Age: 79 y.o. Gender: female  Primary Care Provider: [name], MD Consultants: neurosurgery Code Status: Full  Pt Overview and Major Events to Date:  10/9: Admitted after fall, neurosurgery rec: symptomatic treatment  10/10: TLSO brace applied 10/11: PT rec: SNF with 24 hr assistance  Assessment and Plan:  Alexandria [name] is a 79 y.o. female presenting with headache and back pain after a fall resulting from tripping. PMH is significant for AFib on coumadin, CHF, implanted pacemaker, T2DM.   # L1 superior endplate compression fracture  - Continue wearing TLSO brace when out of bed  - PT/OT rec: SNF, 24hr supervision: which patient has agreed upon after discussion with her brothers - Upright AP and lateral XR of thoracolumbar spine - CSW consulted  - NOT a candidate for MRI due to implanted pacemaker  - Intranasal calcitonin for analgesic adjunct  - oxycodone 5mg  q4h prn RN to offer at time of PT - scheduled tylenol   # AFib: currently in NSR  - Continue home metoprolol 50mg  BID  - INR at 2.73, restart coumadin 7.5mg  today - repeat INR tomorrow - Changing coumadin dosing to 7.5mg  daily  - (usual regimen: 7.5mg  daily with 10mg  on Mon and Fri)  - Telemetry   # T2DM: Home regimen: Levemir 28u qAM, novolog 12u with breakfast, lunch, and 8u with dinner.Hb A1c was 7.4 in September.  - CBG 4 times daily  - Increase Levemir to 28 units Continue sensitive SSI   # Fall with scalp hematoma and abrasion:  - CT scan negative for intracranial hemorrhage, so will continue coumadin  - Stable neuro status  - Tylenol 650mg  prn, oxycodone - Holding anti-emetic  - Hgb stable at 12.4  # CHF, diastolic, last Echo in 2013  - Continue lasix 80mg  BID (home dose) - Daily weight, strict I/O   # HTN:  - Continue metoprolol,  norvasc   # Interstitial lung disease: On 2L home O2 while sleeping only.  - Continue 2L O2 by , titrate prn to keep sats >90%   # Hypothyroidism: TSH wnl in ED  - Continue home synthroid.   # Social  - Brother, Alexandria [name], at bedside. Can be reached at [phone].  - Per patient, he is medical decision maker   FEN/GI: Saline lock IV, Carb-modified diet  Prophylaxis: Continuing coumadin per MD  Disposition: SNF: consult social work for SNF placement  Subjective: low back pain with ambulation and movement. Has been ambulating with walker with PT but otherwise, not ambulating on her own.  Eating well. No chest pain or shortness of breath.   Objective: Temp:  [97.3 F (36.3 C)-98.3 F (36.8 C)] 98.3 F (36.8 C) (10/14 0617) Pulse Rate:  [68-75] 68 (10/14 0617) Resp:  [18-20] 20 (10/14 0617) BP: (121-141)/(50-60) 141/60 mmHg (10/14 0617) SpO2:  [97 %-100 %] 97 % (10/14 0617) Weight:  [187 lb 9.8 oz (85.1 kg)] 187 lb 9.8 oz (85.1 kg) (10/14 0617) Physical Exam: General: no acute distress, laying flat in bed Cardiovascular: RRR, S1S2, no murmur appreciated Respiratory: CTA on anterior exam Abdomen: soft, non tender, non distended Extremities: 4+strength with hip flexion and knee extension and plantarflexion and dorsiflexion bilaterally  Laboratory:  Recent Labs Lab 04/08/13 0444 04/09/13 0416 04/10/13 0410  WBC 10.3 7.5 9.4  HGB 12.4 12.2 12.8  HCT 36.1 35.8* 37.3  PLT 195  183 197    Recent Labs Lab 04/05/13 1910 04/09/13 0416  NA 136 133*  K 3.9 4.0  CL 96 93*  CO2 27 30  BUN 14 20  CREATININE 0.68 0.73  CALCIUM 10.4 10.0  GLUCOSE 179* 274*   INR: 3.15 Imaging/Diagnostic Tests:  CT HEAD IMPRESSION: 04/05/13 1. No acute intracranial abnormalities. No intracranial hemorrhage.  2. Left parietal scalp hematoma. No skull fracture.  CT CERVICAL SPINE IMPRESSION  1. No fracture or acute finding.  2. There are significant degenerative changes and diffuse  bony  demineralization as described.   CT HEAD FINDINGS 04/05/13 The ventricles are normal in configuration. There is ventricular and  sulcal enlargement reflecting moderate atrophy.  There are no parenchymal masses or mass effect. There is extensive  white matter hypoattenuation most consistent with chronic  microvascular ischemic change. There is no evidence of a recent  cortical infarct.  There are no extra-axial masses or abnormal fluid collections.  There is no intracranial hemorrhage.  No skull fracture is seen. The visualized sinuses and mastoid air  cells are clear.  There is a left parietal scalp hematoma with a fluid fluid level  along its posterior margin.  CT CERVICAL SPINE FINDINGS  No fracture. No spondylolisthesis.  There are degenerative changes with loss of disc height and endplate  spurring most notably at C5-C6 and C6-C7. There are facet  degenerative changes. Facet joints are fused on the right at C2-C3  and C3-C4 and on the left at C2-C3.  The bones are extensively demineralized.  Soft tissues show prominent chronic calcifications but are otherwise  unremarkable.  The lung apices show chronic scarring.  IMPRESSION:  CT HEAD IMPRESSION  1. No acute intracranial abnormalities. No intracranial hemorrhage.  2. Left parietal scalp hematoma. No skull fracture.  CT CERVICAL SPINE IMPRESSION  1. No fracture or acute finding.  2. There are significant degenerative changes and diffuse bony  demineralization as described.   Hazeline Junker, MD 04/10/2013, 8:38 AM PGY-1, [facility] FPTS Intern pager: [phone], text pages welcome

## 2013-04-11 ENCOUNTER — Encounter (INDEPENDENT_AMBULATORY_CARE_PROVIDER_SITE_OTHER): Payer: Medicare Other | Admitting: Ophthalmology

## 2013-04-11 LAB — CBC
HCT: 36.5 % (ref 36.0–46.0)
Hemoglobin: 12.4 g/dL (ref 12.0–15.0)
MCH: 28.6 pg (ref 26.0–34.0)
Platelets: 203 10*3/uL (ref 150–400)
RBC: 4.34 MIL/uL (ref 3.87–5.11)
WBC: 8.4 10*3/uL (ref 4.0–10.5)

## 2013-04-11 LAB — GLUCOSE, CAPILLARY: Glucose-Capillary: 328 mg/dL — ABNORMAL HIGH (ref 70–99)

## 2013-04-11 LAB — PROTIME-INR: Prothrombin Time: 21.3 seconds — ABNORMAL HIGH (ref 11.6–15.2)

## 2013-04-11 NOTE — Progress Notes (Signed)
Physical Therapy Treatment Patient Details Name: Alexandria [name] MRN: [id number] DOB: May 11, 1930 Today's Date: 04/11/2013 Time: 4098-1191 PT Time Calculation (min): 26 min  PT Assessment / Plan / Recommendation  History of Present Illness Alexandria [name] is a 79 y.o. female presenting with headache and back pain after a fall resulting from tripping--hit back of left side of head.PMH is significant for AFib on coumadin, CHF, implanted pacemaker, T2DM. x-ray a mild chronic T8 compression fracture, and a mild L1 compression fracture of indeterminate age. Appears to have BPPV. TLSO   PT Comments   Pt admitted with above. Pt currently with functional limitations due to continued deficits with endurance, pain and balance. Moving better everyday.   Pt will benefit from skilled PT to increase their independence and safety with mobility to allow discharge to the venue listed below.   Follow Up Recommendations  SNF;Supervision/Assistance - 24 hour                 Equipment Recommendations  None recommended by PT        Frequency Min 4X/week   Progress towards PT Goals Progress towards PT goals: Progressing toward goals  Plan Current plan remains appropriate    Precautions / Restrictions Precautions Precautions: Fall;Back Required Braces or Orthoses: Spinal Brace Spinal Brace: Thoracolumbosacral orthotic;Applied in supine position Restrictions Weight Bearing Restrictions: No   Pertinent Vitals/Pain VSS, Some back pain    Mobility  Bed Mobility Bed Mobility: Rolling Right;Rolling Left;Left Sidelying to Sit;Sitting - Scoot to Edge of Bed Rolling Right: 4: Min guard;With rail Rolling Left: 4: Min guard;With rail Left Sidelying to Sit: 4: Min assist;With rails Sitting - Scoot to Edge of Bed: 4: Min guard Details for Bed Mobility Assistance:  Verbal reminders re: log roll technique.  Patient able to perform mobility with increased time. Transfers Transfers: Sit to Stand;Stand to Sit Sit  to Stand: 4: Min assist;From bed Stand to Sit: 4: Min assist;With upper extremity assist;With armrests;To chair/3-in-1 Stand Pivot Transfers: Not tested (comment) Details for Transfer Assistance: Verbal cues for hand placement.  Patient without dizziness during mobility.  Good balance in standing with RW. Ambulation/Gait Ambulation/Gait Assistance: 4: Min assist Ambulation Distance (Feet): 150 Feet Assistive device: Rolling walker Ambulation/Gait Assistance Details: verbal cues for walker safety and upright posture.  Pt ambulated to bathroom and able to clean herself after urinating in toilet.   Gait Pattern: Step-through pattern;Decreased stride length Gait velocity: Slow gait speed Stairs: No Wheelchair Mobility Wheelchair Mobility: No    PT Goals (current goals can now be found in the care plan section)    Visit Information  Last PT Received On: 04/11/13 Assistance Needed: +1 History of Present Illness: Alexandria [name] is a 79 y.o. female presenting with headache and back pain after a fall resulting from tripping--hit back of left side of head.PMH is significant for AFib on coumadin, CHF, implanted pacemaker, T2DM. x-ray a mild chronic T8 compression fracture, and a mild L1 compression fracture of indeterminate age. Appears to have BPPV. TLSO    Subjective Data  Subjective: "I am better today."   Cognition  Cognition Arousal/Alertness: Awake/alert Behavior During Therapy: WFL for tasks assessed/performed Overall Cognitive Status: Within Functional Limits for tasks assessed    Balance  Static Sitting Balance Static Sitting - Balance Support: Bilateral upper extremity supported;Feet supported Static Sitting - Level of Assistance: 5: Stand by assistance Static Sitting - Comment/# of Minutes: 2 Static Standing Balance Static Standing - Balance Support: No upper extremity supported;During functional  activity Static Standing - Level of Assistance: 5: Stand by assistance Static  Standing - Comment/# of Minutes: Stood to clean self with better balance today than in previous sessions.    End of Session PT - End of Session Equipment Utilized During Treatment: Gait belt;Back brace Activity Tolerance: Patient limited by pain;Patient limited by fatigue Patient left: in chair;with call bell/phone within reach Nurse Communication: Mobility status       INGOLD,Linlee Cromie 04/11/2013, 11:17 AM [facility] [phone] (pager)

## 2013-04-11 NOTE — Progress Notes (Signed)
Subjective: Patient resting in bed. Complaints of both back pain as well as pain in the back of her head. She has continued to work with physical therapy and occupational therapy. X-rays of the thoracolumbar junction were done yesterday. There were done with her in her brace and upright. They show some further mild loss of vertebral body height, but the overall alignment is good. At this point her activity has been quite limited, and she's apparently been only out of bed with PT.  Objective: Vital signs in last 24 hours: Filed Vitals:   04/10/13 1004 04/10/13 1437 04/10/13 2054 04/11/13 0621  BP:  135/73 129/55 149/58  Pulse: 72 74 82 74  Temp:  97.6 F (36.4 C) 98.6 F (37 C) 98.3 F (36.8 C)  TempSrc:  Oral Oral Oral  Resp:  18 19 19   Height:      Weight:    83 kg (182 lb 15.7 oz)  SpO2:  97% 92% 95%    Intake/Output from previous day: 10/14 0701 - 10/15 0700 In: 920 [P.O.:920] Out: 600 [Urine:600] Intake/Output this shift:    Physical Exam:  Moving all 4 extremities well with good strength.  CBC  Recent Labs  04/10/13 0410 04/11/13 0545  WBC 9.4 8.4  HGB 12.8 12.4  HCT 37.3 36.5  PLT 197 203   BMET  Recent Labs  04/09/13 0416  NA 133*  K 4.0  CL 93*  CO2 30  GLUCOSE 274*  BUN 20  CREATININE 0.73  CALCIUM 10.0    Studies/Results: Dg Thoracolumbar Erect  04/10/2013   CLINICAL DATA:  L1 compression fracture.  EXAM: THORACOLUMBAR SCOLIOSIS STUDY - STANDING VIEWS  COMPARISON:  Plain films lumbar spine 04/05/2013.  FINDINGS: L1 superior endplate compression fracture is again identified and show some progressive vertebral body height loss, now estimated at 30%. No new fracture is identified. Multilevel degenerative change and extensive atherosclerotic vascular disease are noted.  IMPRESSION: Some progression of vertebral body height loss at L1 now estimated at 30%.   Electronically Signed   By: Drusilla Kanner M.D.   On: 04/10/2013 16:33     Assessment/Plan: Patient needs to increase overall mobility and time up and out of bed. I've explained to her that she must don and doff her TLSO in bed, with assistance. She should not wear the TLSO in bed, but she should be up and out of bed for meals, toileting, and ambulation. She needs to increase her ambulation both in her room and in the hallways.  I expect that she will need to use the TLSO for at least 3 months. I need to see her back in my office in 6 weeks with AP and lateral thoracolumbar junction x-rays, to be done at my office on the day the appointment.   Hewitt Shorts, MD 04/11/2013, 8:20 AM

## 2013-04-11 NOTE — Progress Notes (Addendum)
The patient remained A&Ox4 overnight and she did not have any confusion.  She complained of a mild headache and mild lower back pain, which was relieved with Tylenol.  She also stated that she did not sleep well.  The patient did not have any acute changes overnight.

## 2013-04-11 NOTE — Progress Notes (Signed)
Inpatient Diabetes Program Recommendations  AACE/ADA: New Consensus Statement on Inpatient Glycemic Control (2013)  Target Ranges:  Prepandial:   less than 140 mg/dL      Peak postprandial:   less than 180 mg/dL (1-2 hours)      Critically ill patients:  140 - 180 mg/dL   Results for Alexandria [name], Alexandria [name] (MRN [id number]) as of 04/11/2013 11:03  Ref. Range 04/10/2013 06:31 04/10/2013 11:49 04/10/2013 16:38 04/10/2013 20:53 04/11/2013 06:19  Glucose-Capillary Latest Range: 70-99 mg/dL 829 (H) 562 (H) 130 (H) 276 (H) 236 (H)    Inpatient Diabetes Program Recommendations Insulin - Basal: Noted Levemir was increased from 24 units QAM to 28 units QAM and patient received Levemir 28 units this morning. Correction (SSI): Please consider increasing Novolog correction to moderate scale and add Novolog bedtime correction. Insulin - Meal Coverage: Please consider ordering Novolog 3 units TID with meals if patient eats at least 50% of meal.  Note: Blood glucose over the past 24 hours has ranged from 236-325 mg/dl and fasting glucose this morning was 236 mg/dl.  Noted that Levemir has been increased to 28 units QAM and patient received this morning.  Please consider increasing Novolog correction to moderate scale and adding Novolog bedtime correction.  Also, please consider ordering Novolog 3 units TID with meals for meal coverage.  Will continue to follow.  Thanks, Orlando Penner, RN, MSN, CCRN Diabetes Coordinator Inpatient Diabetes Program [phone] (Team Pager) [phone] (AP office) [phone] [facility] office)

## 2013-04-11 NOTE — Progress Notes (Signed)
Transport picked pt up gave 5mg  oxy for travel. Called three times to [facility] for report left messages. TLSO brace transported with patient. V/S stable upon transfer.

## 2013-04-12 ENCOUNTER — Telehealth: Payer: Self-pay | Admitting: Family Medicine

## 2013-04-12 NOTE — Discharge Summary (Signed)
I have reviewed this discharge summary and agree.    

## 2013-04-12 NOTE — Telephone Encounter (Signed)
Appt given per [facility]

## 2013-04-18 ENCOUNTER — Other Ambulatory Visit: Payer: Self-pay | Admitting: Family Medicine

## 2013-04-24 ENCOUNTER — Ambulatory Visit: Payer: Medicare Other | Admitting: Family Medicine

## 2013-04-25 ENCOUNTER — Telehealth: Payer: Self-pay | Admitting: Pharmacist

## 2013-04-25 NOTE — Telephone Encounter (Signed)
Patient fell and had compression fracture.  She is now in [place] for rehabilitation.

## 2013-05-01 ENCOUNTER — Ambulatory Visit: Payer: Medicare Other | Admitting: Family Medicine

## 2013-05-23 ENCOUNTER — Encounter (HOSPITAL_COMMUNITY): Payer: Self-pay | Admitting: Internal Medicine

## 2013-05-23 ENCOUNTER — Inpatient Hospital Stay (HOSPITAL_COMMUNITY)
Admission: AD | Admit: 2013-05-23 | Discharge: 2013-06-06 | DRG: 391 | Disposition: A | Discharge status: Skilled Nursing Facility | Payer: Medicare Other | Source: Other Acute Inpatient Hospital | Attending: Internal Medicine | Admitting: Internal Medicine

## 2013-05-23 DIAGNOSIS — R42 Dizziness and giddiness: Secondary | ICD-10-CM

## 2013-05-23 DIAGNOSIS — I5043 Acute on chronic combined systolic (congestive) and diastolic (congestive) heart failure: Secondary | ICD-10-CM

## 2013-05-23 DIAGNOSIS — D72829 Elevated white blood cell count, unspecified: Secondary | ICD-10-CM | POA: Diagnosis present

## 2013-05-23 DIAGNOSIS — R0602 Shortness of breath: Secondary | ICD-10-CM

## 2013-05-23 DIAGNOSIS — N390 Urinary tract infection, site not specified: Secondary | ICD-10-CM

## 2013-05-23 DIAGNOSIS — E785 Hyperlipidemia, unspecified: Secondary | ICD-10-CM | POA: Diagnosis present

## 2013-05-23 DIAGNOSIS — I252 Old myocardial infarction: Secondary | ICD-10-CM

## 2013-05-23 DIAGNOSIS — K5792 Diverticulitis of intestine, part unspecified, without perforation or abscess without bleeding: Secondary | ICD-10-CM | POA: Insufficient documentation

## 2013-05-23 DIAGNOSIS — Z833 Family history of diabetes mellitus: Secondary | ICD-10-CM

## 2013-05-23 DIAGNOSIS — J849 Interstitial pulmonary disease, unspecified: Secondary | ICD-10-CM | POA: Diagnosis present

## 2013-05-23 DIAGNOSIS — I10 Essential (primary) hypertension: Secondary | ICD-10-CM | POA: Diagnosis present

## 2013-05-23 DIAGNOSIS — Z86010 Personal history of colonic polyps: Secondary | ICD-10-CM

## 2013-05-23 DIAGNOSIS — K5732 Diverticulitis of large intestine without perforation or abscess without bleeding: Principal | ICD-10-CM | POA: Diagnosis present

## 2013-05-23 DIAGNOSIS — E1169 Type 2 diabetes mellitus with other specified complication: Secondary | ICD-10-CM | POA: Diagnosis not present

## 2013-05-23 DIAGNOSIS — I5032 Chronic diastolic (congestive) heart failure: Secondary | ICD-10-CM | POA: Diagnosis present

## 2013-05-23 DIAGNOSIS — Z950 Presence of cardiac pacemaker: Secondary | ICD-10-CM

## 2013-05-23 DIAGNOSIS — I509 Heart failure, unspecified: Secondary | ICD-10-CM

## 2013-05-23 DIAGNOSIS — I4891 Unspecified atrial fibrillation: Secondary | ICD-10-CM | POA: Diagnosis present

## 2013-05-23 DIAGNOSIS — Z9861 Coronary angioplasty status: Secondary | ICD-10-CM

## 2013-05-23 DIAGNOSIS — I6529 Occlusion and stenosis of unspecified carotid artery: Secondary | ICD-10-CM | POA: Diagnosis present

## 2013-05-23 DIAGNOSIS — H409 Unspecified glaucoma: Secondary | ICD-10-CM | POA: Diagnosis present

## 2013-05-23 DIAGNOSIS — I495 Sick sinus syndrome: Secondary | ICD-10-CM

## 2013-05-23 DIAGNOSIS — R29898 Other symptoms and signs involving the musculoskeletal system: Secondary | ICD-10-CM | POA: Diagnosis present

## 2013-05-23 DIAGNOSIS — E538 Deficiency of other specified B group vitamins: Secondary | ICD-10-CM

## 2013-05-23 DIAGNOSIS — R791 Abnormal coagulation profile: Secondary | ICD-10-CM | POA: Diagnosis present

## 2013-05-23 DIAGNOSIS — R609 Edema, unspecified: Secondary | ICD-10-CM

## 2013-05-23 DIAGNOSIS — I491 Atrial premature depolarization: Secondary | ICD-10-CM

## 2013-05-23 DIAGNOSIS — R531 Weakness: Secondary | ICD-10-CM

## 2013-05-23 DIAGNOSIS — E119 Type 2 diabetes mellitus without complications: Secondary | ICD-10-CM | POA: Diagnosis present

## 2013-05-23 DIAGNOSIS — Z794 Long term (current) use of insulin: Secondary | ICD-10-CM

## 2013-05-23 DIAGNOSIS — E1142 Type 2 diabetes mellitus with diabetic polyneuropathy: Secondary | ICD-10-CM | POA: Diagnosis present

## 2013-05-23 DIAGNOSIS — I2510 Atherosclerotic heart disease of native coronary artery without angina pectoris: Secondary | ICD-10-CM | POA: Diagnosis present

## 2013-05-23 DIAGNOSIS — E1149 Type 2 diabetes mellitus with other diabetic neurological complication: Secondary | ICD-10-CM | POA: Diagnosis present

## 2013-05-23 DIAGNOSIS — S32010A Wedge compression fracture of first lumbar vertebra, initial encounter for closed fracture: Secondary | ICD-10-CM

## 2013-05-23 DIAGNOSIS — J8410 Pulmonary fibrosis, unspecified: Secondary | ICD-10-CM

## 2013-05-23 DIAGNOSIS — E876 Hypokalemia: Secondary | ICD-10-CM | POA: Diagnosis present

## 2013-05-23 DIAGNOSIS — F411 Generalized anxiety disorder: Secondary | ICD-10-CM | POA: Diagnosis present

## 2013-05-23 DIAGNOSIS — Z22322 Carrier or suspected carrier of Methicillin resistant Staphylococcus aureus: Secondary | ICD-10-CM

## 2013-05-23 DIAGNOSIS — Z7901 Long term (current) use of anticoagulants: Secondary | ICD-10-CM

## 2013-05-23 DIAGNOSIS — Z85828 Personal history of other malignant neoplasm of skin: Secondary | ICD-10-CM

## 2013-05-23 DIAGNOSIS — E669 Obesity, unspecified: Secondary | ICD-10-CM | POA: Diagnosis present

## 2013-05-23 DIAGNOSIS — I5033 Acute on chronic diastolic (congestive) heart failure: Secondary | ICD-10-CM | POA: Diagnosis present

## 2013-05-23 DIAGNOSIS — I4892 Unspecified atrial flutter: Secondary | ICD-10-CM

## 2013-05-23 MED ORDER — GUAIFENESIN 100 MG/5ML PO SOLN
10.0000 mL | Freq: Four times a day (QID) | ORAL | Status: DC | PRN
  Filled 2013-05-23: qty 10

## 2013-05-23 MED ORDER — SODIUM CHLORIDE 0.9 % IV BOLUS (SEPSIS)
500.0000 mL | Freq: Once | INTRAVENOUS | Status: AC
  Administered 2013-05-23: 500 mL via INTRAVENOUS

## 2013-05-23 MED ORDER — METOPROLOL TARTRATE 50 MG PO TABS
50.0000 mg | ORAL_TABLET | Freq: Two times a day (BID) | ORAL | Status: DC
  Administered 2013-05-23: 50 mg via ORAL
  Filled 2013-05-23 (×3): qty 1

## 2013-05-23 MED ORDER — SODIUM CHLORIDE 0.9 % IJ SOLN
3.0000 mL | Freq: Two times a day (BID) | INTRAMUSCULAR | Status: DC
  Administered 2013-05-23 – 2013-06-04 (×12): 3 mL via INTRAVENOUS

## 2013-05-23 MED ORDER — SENNOSIDES 8.6 MG PO TABS
1.0000 | ORAL_TABLET | Freq: Two times a day (BID) | ORAL | Status: DC | PRN
  Filled 2013-05-23: qty 1

## 2013-05-23 MED ORDER — DEXTROSE-NACL 5-0.9 % IV SOLN
INTRAVENOUS | Status: DC
  Administered 2013-05-24 – 2013-05-25 (×3): via INTRAVENOUS
  Administered 2013-05-26: 30 mL/h via INTRAVENOUS

## 2013-05-23 MED ORDER — ONDANSETRON HCL 4 MG PO TABS
4.0000 mg | ORAL_TABLET | Freq: Four times a day (QID) | ORAL | Status: DC | PRN
  Administered 2013-05-30: 4 mg via ORAL
  Filled 2013-05-23: qty 1

## 2013-05-23 MED ORDER — AZELASTINE HCL 0.1 % NA SOLN
2.0000 | Freq: Two times a day (BID) | NASAL | Status: DC
  Administered 2013-05-23 – 2013-06-05 (×21): 2 via NASAL
  Filled 2013-05-23 (×2): qty 30

## 2013-05-23 MED ORDER — CIPROFLOXACIN IN D5W 400 MG/200ML IV SOLN
400.0000 mg | Freq: Two times a day (BID) | INTRAVENOUS | Status: DC
  Administered 2013-05-23 – 2013-05-25 (×4): 400 mg via INTRAVENOUS
  Filled 2013-05-23 (×5): qty 200

## 2013-05-23 MED ORDER — ISOSORBIDE MONONITRATE ER 60 MG PO TB24
60.0000 mg | ORAL_TABLET | Freq: Every day | ORAL | Status: DC
  Filled 2013-05-23: qty 1

## 2013-05-23 MED ORDER — LEVOTHYROXINE SODIUM 50 MCG PO TABS
50.0000 ug | ORAL_TABLET | Freq: Every day | ORAL | Status: DC
  Administered 2013-05-24: 50 ug via ORAL
  Filled 2013-05-23 (×3): qty 1

## 2013-05-23 MED ORDER — CALCITONIN (SALMON) 200 UNIT/ACT NA SOLN
1.0000 | Freq: Every day | NASAL | Status: DC
  Administered 2013-05-24 – 2013-06-04 (×10): 1 via NASAL
  Filled 2013-05-23 (×3): qty 3.7

## 2013-05-23 MED ORDER — INSULIN ASPART 100 UNIT/ML ~~LOC~~ SOLN
0.0000 [IU] | SUBCUTANEOUS | Status: DC
  Administered 2013-05-23 – 2013-05-24 (×4): 3 [IU] via SUBCUTANEOUS
  Administered 2013-05-24: 2 [IU] via SUBCUTANEOUS
  Administered 2013-05-24 – 2013-05-25 (×2): 3 [IU] via SUBCUTANEOUS
  Administered 2013-05-25: 2 [IU] via SUBCUTANEOUS
  Administered 2013-05-25: 5 [IU] via SUBCUTANEOUS
  Administered 2013-05-25: 3 [IU] via SUBCUTANEOUS
  Administered 2013-05-25: 2 [IU] via SUBCUTANEOUS
  Administered 2013-05-25 – 2013-05-26 (×3): 3 [IU] via SUBCUTANEOUS
  Administered 2013-05-26 – 2013-05-27 (×7): 5 [IU] via SUBCUTANEOUS
  Administered 2013-05-27: 15 [IU] via SUBCUTANEOUS
  Administered 2013-05-27: 5 [IU] via SUBCUTANEOUS
  Administered 2013-05-27 – 2013-05-28 (×2): 11 [IU] via SUBCUTANEOUS
  Administered 2013-05-28: 8 [IU] via SUBCUTANEOUS
  Administered 2013-05-28: 5 [IU] via SUBCUTANEOUS
  Administered 2013-05-28: 11 [IU] via SUBCUTANEOUS
  Administered 2013-05-28: 3 [IU] via SUBCUTANEOUS
  Administered 2013-05-29: 11 [IU] via SUBCUTANEOUS
  Administered 2013-05-29: 8 [IU] via SUBCUTANEOUS
  Administered 2013-05-29 (×2): 3 [IU] via SUBCUTANEOUS
  Administered 2013-05-29 (×2): 15 [IU] via SUBCUTANEOUS
  Administered 2013-05-29: 3 [IU] via SUBCUTANEOUS
  Administered 2013-05-30 (×2): 11 [IU] via SUBCUTANEOUS
  Administered 2013-05-30: 8 [IU] via SUBCUTANEOUS
  Administered 2013-05-30 – 2013-05-31 (×2): 2 [IU] via SUBCUTANEOUS

## 2013-05-23 MED ORDER — HYDROCODONE-ACETAMINOPHEN 5-325 MG PO TABS
1.0000 | ORAL_TABLET | ORAL | Status: DC | PRN
  Administered 2013-05-24 – 2013-06-05 (×6): 1 via ORAL
  Filled 2013-05-23 (×6): qty 1

## 2013-05-23 MED ORDER — LIDOCAINE 5 % EX PTCH
1.0000 | MEDICATED_PATCH | CUTANEOUS | Status: DC
  Administered 2013-05-24 – 2013-06-05 (×13): 1 via TRANSDERMAL
  Filled 2013-05-23 (×14): qty 1

## 2013-05-23 MED ORDER — LORAZEPAM 1 MG PO TABS
1.0000 mg | ORAL_TABLET | Freq: Three times a day (TID) | ORAL | Status: DC | PRN
  Administered 2013-05-24 – 2013-05-26 (×2): 1 mg via ORAL
  Filled 2013-05-23 (×2): qty 1

## 2013-05-23 MED ORDER — CYANOCOBALAMIN 1000 MCG/ML IJ SOLN
1000.0000 ug | INTRAMUSCULAR | Status: DC
  Administered 2013-05-31: 1000 ug via INTRAMUSCULAR
  Filled 2013-05-23 (×5): qty 1

## 2013-05-23 MED ORDER — ONDANSETRON HCL 4 MG/2ML IJ SOLN
4.0000 mg | Freq: Four times a day (QID) | INTRAMUSCULAR | Status: DC | PRN
  Administered 2013-05-25 – 2013-06-04 (×5): 4 mg via INTRAVENOUS
  Filled 2013-05-23 (×5): qty 2

## 2013-05-23 MED ORDER — AMLODIPINE BESYLATE 5 MG PO TABS
5.0000 mg | ORAL_TABLET | Freq: Every day | ORAL | Status: DC
  Filled 2013-05-23: qty 1

## 2013-05-23 MED ORDER — METRONIDAZOLE IN NACL 5-0.79 MG/ML-% IV SOLN
500.0000 mg | Freq: Three times a day (TID) | INTRAVENOUS | Status: DC
  Administered 2013-05-23 – 2013-05-25 (×5): 500 mg via INTRAVENOUS
  Filled 2013-05-23 (×8): qty 100

## 2013-05-23 MED ORDER — LATANOPROST 0.005 % OP SOLN
1.0000 [drp] | Freq: Every day | OPHTHALMIC | Status: DC
  Administered 2013-05-23 – 2013-06-05 (×14): 1 [drp] via OPHTHALMIC
  Filled 2013-05-23: qty 2.5

## 2013-05-23 MED ORDER — METOPROLOL TARTRATE 1 MG/ML IV SOLN
5.0000 mg | Freq: Four times a day (QID) | INTRAVENOUS | Status: DC
  Administered 2013-05-24 – 2013-05-26 (×10): 5 mg via INTRAVENOUS
  Filled 2013-05-23 (×17): qty 5

## 2013-05-23 MED ORDER — MORPHINE SULFATE 2 MG/ML IJ SOLN
1.0000 mg | INTRAMUSCULAR | Status: DC | PRN
  Administered 2013-05-24 – 2013-05-27 (×4): 2 mg via INTRAVENOUS
  Filled 2013-05-23 (×4): qty 1

## 2013-05-23 MED ORDER — ACETAMINOPHEN 325 MG PO TABS
650.0000 mg | ORAL_TABLET | ORAL | Status: DC | PRN
Start: 2013-05-23 — End: 2013-06-06
  Administered 2013-05-23 – 2013-06-06 (×7): 650 mg via ORAL
  Filled 2013-05-23 (×9): qty 2

## 2013-05-23 MED ORDER — FUROSEMIDE 80 MG PO TABS
80.0000 mg | ORAL_TABLET | Freq: Two times a day (BID) | ORAL | Status: DC
  Administered 2013-05-23: 80 mg via ORAL
  Filled 2013-05-23 (×4): qty 1

## 2013-05-23 MED ORDER — WHITE PETROLATUM GEL: 1.0000 "application " | Status: DC | PRN

## 2013-05-23 NOTE — Progress Notes (Signed)
eLink Physician-Brief Progress Note Patient Name: EMMAROSE KLINKE DOB: September 21, 1929 MRN: [id number]  Date of Service  05/23/2013   HPI/Events of Note   No dvt prophylaxis  eICU Interventions  scd's   Intervention Category Intermediate Interventions: Best-practice therapies (e.g. DVT, beta blocker, etc.)  Mirenda Baltazar 05/23/2013, 10:26 PM

## 2013-05-23 NOTE — H&P (Signed)
[facility] History and Physical  Alexandria [name] ZOX:[id number] DOB: [birth year]    PCP:   [name], MD   Chief Complaint: abdominal pain.  HPI: Alexandria [name] is an 79 y.o. female with hx of afib on chronic anticoagulation, hx of B12 deficiency, essential tremor, ILD, hyperlipidemia, DM, s/p PPM who presented to [facility] hospital with abdominal pain.  She was found to have a perforated diverticulitis by CT and subsequently transferred to [facility] per patient and family's request.  Surgery was consulted prior to arrival and deferred admission to hospitalist as she does have multiple concomitant medical problems.  Upon arrival, Dr Bo Merino of surgery had seen her and made recommendation to continue conversative tx with antibiotics hoping to avoid surgery.  She felt better and maintain hemodynamic stability except with mild tachycardia having HR of 125.  She felt better with her abdominal pain.  Rewiew of Systems:  Constitutional: Negative for malaise. No significant weight loss or weight gain Eyes: Negative for eye pain, redness and discharge, diplopia, visual changes, or flashes of light. ENMT: Negative for ear pain, hoarseness, nasal congestion, sinus pressure and sore throat. No headaches; tinnitus, drooling, or problem swallowing. Cardiovascular: Negative for chest pain, palpitations, diaphoresis, dyspnea and peripheral edema. ; No orthopnea, PND Respiratory: Negative for cough, hemoptysis, wheezing and stridor. No pleuritic chestpain. Gastrointestinal: Negative for diarrhea, constipation, melena, blood in stool, hematemesis, jaundice and rectal bleeding.    Genitourinary: Negative for frequency, dysuria, incontinence,flank pain and hematuria; Musculoskeletal: Negative for back pain and neck pain. Negative for swelling and trauma.;  Skin: . Negative for pruritus, rash, abrasions, bruising and skin lesion.; ulcerations Neuro: Negative for headache, lightheadedness and neck stiffness.  Negative for weakness, altered level of consciousness , altered mental status, extremity weakness, burning feet, involuntary movement, seizure and syncope.  Psych: negative for anxiety, depression, insomnia, tearfulness, panic attacks, hallucinations, paranoia, suicidal or homicidal ideation    Past Medical History  Diagnosis Date  . Hypertension     with h/o hypertensive urgency  . Glaucoma   . Tremor, essential   . Diabetes mellitus with neuropathy   . B12 deficiency   . Hyperlipidemia   . Atrial fibrillation     a. on chronic anticoagulation with warfarin  b. Tiksosyn discontinued and amiodarone  was discontinued secondary to perceived symptoms  . Chronic diastolic heart failure     a. echo 05/2009: mild LVH, EF 55-60%, grade 2 diast dysfxn, mild LAE, PASP;   b. Echo 3/13:   mod LVH, EF 55-60%, mild to mod calc AV annulus, very mild AS (mean 9 mmHg)  . CAD (coronary artery disease)     a.  b. 07/2011 NSTEMI - PCI/DES LCX - Resolute stent;;   b. LHC 12/27/11: mLAD 30%, prox-mid D1 90% (small);  CFX patent stent, OM3 small 95%,, mRCA 30%, EF 65%  -  med Rx;   . Carotid stenosis     a. dopplers 6/12: 1-39% bilat ICA; b. carotid dopplers 7/13: bilat 1-39% ICA  . Pulmonary fibrosis     Dr. Vassie Loll;  patient taken off O2 in 8/12; dyspnea felt CHF>>ILD  . Skin cancer   . Colon polyps   . Syncope     unknown etiology  . Bradycardia   . Pacemaker 02/29/2012  . Diabetes mellitus     insulin controled  . Elevated TSH 05/2012  . Normocytic anemia 05/2012  . CHF (congestive heart failure)     Past Surgical History  Procedure Laterality Date  .  Appendectomy    . Nose surgery    . Colonoscopy    . Cardiac catheterization  2013    stent  . Coronary angioplasty with stent placement    . Insert / replace / remove pacemaker  02/29/2012  . Dilation and curettage of uterus      Medications:  HOME MEDS: Prior to Admission medications   Medication Sig Start Date End Date Taking? Authorizing  Provider  acetaminophen (TYLENOL) 325 MG tablet Take 650 mg by mouth every 4 (four) hours as needed for mild pain or fever.   Yes Historical Provider, MD  aluminum-magnesium hydroxide-simethicone (MAALOX) 200-200-20 MG/5ML SUSP Take 20 mLs by mouth 3 (three) times daily as needed (indigestion).   Yes Historical Provider, MD  amLODipine (NORVASC) 5 MG tablet Take 5 mg by mouth daily.   Yes Historical Provider, MD  azelastine (ASTELIN) 137 MCG/SPRAY nasal spray Place 2 sprays into both nostrils 2 (two) times daily. Use in each nostril as directed   Yes Historical Provider, MD  bismuth subsalicylate (PEPTO BISMOL) 262 MG/15ML suspension Take 30 mLs by mouth as needed for diarrhea or loose stools.   Yes Historical Provider, MD  calcitonin, salmon, (MIACALCIN/FORTICAL) 200 UNIT/ACT nasal spray Place 1 spray into alternate nostrils daily.   Yes Historical Provider, MD  dextrose 5 % SOLN 50 mL with cefTRIAXone 1 G SOLR 1 g Inject 1 g into the vein daily.   Yes Historical Provider, MD  furosemide (LASIX) 80 MG tablet Take 1 tablet (80 mg total) by mouth 2 (two) times daily. 06/28/12  Yes Jodelle Gross, NP  guaiFENesin (ROBITUSSIN) 100 MG/5ML SOLN Take 10 mLs by mouth every 6 (six) hours as needed for cough or to loosen phlegm.   Yes Historical Provider, MD  HYDROcodone-acetaminophen (NORCO/VICODIN) 5-325 MG per tablet Take 1 tablet by mouth every 4 (four) hours as needed for moderate pain.   Yes Historical Provider, MD  insulin aspart (NOVOLOG FLEXPEN) 100 UNIT/ML injection Inject 10 Units into the skin 3 (three) times daily before meals. Inject 10 units before breakfast and lunch and 8 units before supper 01/25/13  Yes Tammy Eckard, PHARMD  insulin glargine (LANTUS) 100 UNIT/ML injection Inject 8 Units into the skin at bedtime.   Yes Historical Provider, MD  isosorbide mononitrate (IMDUR) 60 MG 24 hr tablet Take 60 mg by mouth daily.   Yes Historical Provider, MD  latanoprost (XALATAN) 0.005 % ophthalmic  solution Place 1 drop into both eyes at bedtime.    Yes Historical Provider, MD  levothyroxine (SYNTHROID, LEVOTHROID) 50 MCG tablet Take 50 mcg by mouth daily before breakfast.   Yes Historical Provider, MD  lidocaine (LIDODERM) 5 % Place 1 patch onto the skin daily. Left lumbar area; Remove & Discard patch within 12 hours or as directed by MD   Yes Historical Provider, MD  LORazepam (ATIVAN) 1 MG tablet Take 1 mg by mouth every 8 (eight) hours as needed for anxiety.   Yes Historical Provider, MD  metoprolol (LOPRESSOR) 50 MG tablet Take 50 mg by mouth 2 (two) times daily.   Yes Historical Provider, MD  warfarin (COUMADIN) 4 MG tablet Take 4 mg by mouth daily.   Yes Historical Provider, MD  white petrolatum (VASELINE) GEL Apply 1 application topically as needed for dry skin.   Yes Historical Provider, MD  senna (SENOKOT) 8.6 MG tablet Take 1 tablet by mouth 2 (two) times daily as needed for constipation.    Historical Provider, MD  Allergies:  Allergies  Allergen Reactions  . Prednisone Other (See Comments)    REACTION: "retained fluid"  . Ace Inhibitors Cough       . Crestor [Rosuvastatin Calcium] Other (See Comments)    Fatigue and leg pain   . Lipitor [Atorvastatin Calcium] Other (See Comments)    Myalgia and fatigue   . Niaspan [Niacin] Other (See Comments)    unknown  . Olmesartan Cough    Social History:   reports that she has never smoked. She has never used smokeless tobacco. She reports that she does not drink alcohol or use illicit drugs.  Family History: Family History  Problem Relation Age of Onset  . Throat cancer Brother   . Prostate cancer Father   . Diabetes Mother   . Diabetes Brother     multiple  . Heart disease Brother   . Colon cancer Neg Hx      Physical Exam: Filed Vitals:   05/23/13 1915  BP: 129/74  Pulse: 103  Temp: 100.1 F (37.8 C)  TempSrc: Oral  Resp: 27  Height: 5\' 3"  (1.6 m)  SpO2: 98%   Blood pressure 129/74, pulse 103,  temperature 100.1 F (37.8 C), temperature source Oral, resp. rate 27, height 5\' 3"  (1.6 m), SpO2 98.00%.  GEN:  Pleasant patient lying in the stretcher in no acute distress; cooperative with exam. PSYCH:  alert and oriented x4; does not appear anxious or depressed; affect is appropriate. HEENT: Mucous membranes pink and anicteric; PERRLA; EOM intact; no cervical lymphadenopathy nor thyromegaly or carotid bruit; no JVD; There were no stridor. Neck is very supple. Breasts:: Not examined CHEST WALL: No tenderness CHEST: Normal respiration, clear to auscultation bilaterally.  HEART: Regular rate and rhythm.  There are no murmur, rub, or gallops.   BACK: No kyphosis or scoliosis; no CVA tenderness ABDOMEN: soft and non-tender; no masses, no organomegaly, normal abdominal bowel sounds; no pannus; no intertriginous candida. There is no rebound and no distention. Rectal Exam: Not done EXTREMITIES: No bone or joint deformity; age-appropriate arthropathy of the hands and knees; no edema; no ulcerations.  There is no calf tenderness. Genitalia: not examined PULSES: 2+ and symmetric SKIN: Normal hydration no rash or ulceration CNS: Cranial nerves 2-12 grossly intact no focal lateralizing neurologic deficit.  Speech is fluent; uvula elevated with phonation, facial symmetry and tongue midline. DTR are normal bilaterally, cerebella exam is intact, barbinski is negative and strengths are equaled bilaterally.  No sensory loss.    Radiological Exams on Admission: CT scan was done on outside hospital.   Assessment/Plan Present on Admission:  . Diverticulitis of colon (without mention of hemorrhage) . Atrial fibrillation . Chronic diastolic CHF (congestive heart failure) . HYPERLIPIDEMIA . HYPERTENSION . DIABETES MELLITUS . OBESITY . Occlusion and stenosis of carotid artery without mention of cerebral infarction . ILD (interstitial lung disease) . Diverticulitis  PLAN: WIll continue with IV Cipro  and IV Flagyl.  Will give clear liquid.  I will hold her coumadin in case she needs surgery for her diverticulitis.  She does have a pacemaker, and will continue her betablocker, along with using PRN IV Lopressor to control her HR.  For her DM, will use SSI.  As far as her pulmonary fibrosis, she is not in any respiratory problem.  Will use judicious IVF, as she has hx of CHF.  She is stable, full code, and will be admitted to [facility] service.  Hopefully, her diverticulitis will improve with conservative treatment, and her coumadin  can be resume.  Thank you for letting me participate in her care.  Other plans as per orders.  Code Status: FULL Unk Lightning, MD. [facility] Pager [phone] 7pm to 7am.  05/23/2013, 10:01 PM

## 2013-05-23 NOTE — Consult Note (Signed)
Reason for Consult:perforated diverticulitis Referring Physician: Dr. Fannie Knee Alexandria [name] is an 79 y.o. female.  HPI: the patient is a 79 year old female who was transferred from outside hospital secondary to perforated diverticulitis.  The patient has a history of multiple medical problems include atrial fibrillation on Coumadin, CHF, COPD,  Hypertension, and diabetes. Patient was at a rehabilitation hospital secondary to a L1 compression fracture of her spine. Patient states she began having abdominal pain the day prior to admission. Secondary to abdominal pain she underwent a CT scan today. CT scan revealed perforated diverticulitis with no abscess upon review the CT scan. It was the patient's wish to be transferred to [facility] for further treatment. The patient was given antibiotics her previous hospital and was made n.p.o.  The patient recently had a colonoscopy in 2011 with the patient states she had polyps and diverticulosis. The patient states she's never had a previous bout of diverticulitis.  The patient states that the pain has gotten better as of today. She states she's had no diarrhea or fevers. She did have some nausea and some emesis 2 days ago, and this has since resolved.  Past Medical History  Diagnosis Date  . Hypertension     with h/o hypertensive urgency  . Glaucoma   . Tremor, essential   . Diabetes mellitus with neuropathy   . B12 deficiency   . Hyperlipidemia   . Atrial fibrillation     a. on chronic anticoagulation with warfarin  b. Tiksosyn discontinued and amiodarone  was discontinued secondary to perceived symptoms  . Chronic diastolic heart failure     a. echo 05/2009: mild LVH, EF 55-60%, grade 2 diast dysfxn, mild LAE, PASP;   b. Echo 3/13:   mod LVH, EF 55-60%, mild to mod calc AV annulus, very mild AS (mean 9 mmHg)  . CAD (coronary artery disease)     a.  b. 07/2011 NSTEMI - PCI/DES LCX - Resolute stent;;   b. LHC 12/27/11: mLAD 30%, prox-mid D1 90%  (small);  CFX patent stent, OM3 small 95%,, mRCA 30%, EF 65%  -  med Rx;   . Carotid stenosis     a. dopplers 6/12: 1-39% bilat ICA; b. carotid dopplers 7/13: bilat 1-39% ICA  . Pulmonary fibrosis     Dr. Vassie Loll;  patient taken off O2 in 8/12; dyspnea felt CHF>>ILD  . Skin cancer   . Colon polyps   . Syncope     unknown etiology  . Bradycardia   . Pacemaker 02/29/2012  . Diabetes mellitus     insulin controled  . Elevated TSH 05/2012  . Normocytic anemia 05/2012  . CHF (congestive heart failure)     Past Surgical History  Procedure Laterality Date  . Appendectomy    . Nose surgery    . Colonoscopy    . Cardiac catheterization  2013    stent  . Coronary angioplasty with stent placement    . Insert / replace / remove pacemaker  02/29/2012  . Dilation and curettage of uterus      Family History  Problem Relation Age of Onset  . Throat cancer Brother   . Prostate cancer Father   . Diabetes Mother   . Diabetes Brother     multiple  . Heart disease Brother   . Colon cancer Neg Hx     Social History:  reports that she has never smoked. She has never used smokeless tobacco. She reports that she does not drink  alcohol or use illicit drugs.  Allergies:  Allergies  Allergen Reactions  . Prednisone Other (See Comments)    REACTION: "retained fluid"  . Ace Inhibitors Cough       . Crestor [Rosuvastatin Calcium] Other (See Comments)    Fatigue and leg pain   . Lipitor [Atorvastatin Calcium] Other (See Comments)    Myalgia and fatigue   . Niaspan [Niacin] Other (See Comments)    unknown  . Olmesartan Cough    Medications: I have reviewed the patient's current medications.  No results found for this or any previous visit (from the past 48 hour(s)).  No results found.  Review of Systems  Unable to perform ROS Constitutional: Negative for fever.  HENT: Negative.   Respiratory: Negative.   Cardiovascular: Negative.   Gastrointestinal: Positive for nausea and  abdominal pain. Negative for vomiting and diarrhea.  Genitourinary: Negative.   Musculoskeletal: Negative.   Skin: Negative.   Neurological: Negative.    Blood pressure 129/74, pulse 103, temperature 100.1 F (37.8 C), temperature source Oral, resp. rate 27, height 5\' 3"  (1.6 m), SpO2 98.00%. Physical Exam  Constitutional: She is oriented to person, place, and time. She appears well-developed and well-nourished.  HENT:  Head: Normocephalic and atraumatic.  Eyes: Conjunctivae and EOM are normal. Pupils are equal, round, and reactive to light.  Neck: Normal range of motion. Neck supple.  Cardiovascular: Regular rhythm and normal heart sounds.  Tachycardia present.   Respiratory: Effort normal and breath sounds normal.  GI: Soft. Bowel sounds are normal. She exhibits no distension and no mass. There is tenderness (infraumbilical). There is no rebound and no guarding.  Musculoskeletal: Normal range of motion.  Neurological: She is alert and oriented to person, place, and time.    Assessment/Plan: 79 year old female with a perforated diverticulitis. 1. Recommend Cipro and Flagyl antibiotics. 2. Remain n.p.o. With IV fluids. 2. Discussed with the patient that we'll continue with abdominal exams and should the diverticulitis improved she may likely avoid an operation. I also discussed the patient should the diverticulitis get worse she may need an operation requiring resection of her colon and ostomy.  Marigene Ehlers., [name] 05/23/2013, 9:31 PM

## 2013-05-24 ENCOUNTER — Encounter (HOSPITAL_COMMUNITY): Payer: Self-pay

## 2013-05-24 DIAGNOSIS — I509 Heart failure, unspecified: Secondary | ICD-10-CM

## 2013-05-24 DIAGNOSIS — I5032 Chronic diastolic (congestive) heart failure: Secondary | ICD-10-CM

## 2013-05-24 DIAGNOSIS — D72829 Elevated white blood cell count, unspecified: Secondary | ICD-10-CM

## 2013-05-24 LAB — COMPREHENSIVE METABOLIC PANEL
ALT: 5 U/L (ref 0–35)
BUN: 12 mg/dL (ref 6–23)
CO2: 30 mEq/L (ref 19–32)
Calcium: 9.4 mg/dL (ref 8.4–10.5)
Chloride: 97 mEq/L (ref 96–112)
Creatinine, Ser: 0.74 mg/dL (ref 0.50–1.10)
GFR calc Af Amer: >90 mL/min (ref >90)
GFR calc non Af Amer: 78 mL/min — ABNORMAL LOW (ref >90)
Glucose, Bld: 179 mg/dL — ABNORMAL HIGH (ref 70–99)
Total Bilirubin: 0.5 mg/dL (ref 0.3–1.2)
Total Protein: 6.6 g/dL (ref 6.0–8.3)

## 2013-05-24 LAB — GLUCOSE, CAPILLARY
Glucose-Capillary: 191 mg/dL — ABNORMAL HIGH (ref 70–99)
Glucose-Capillary: 184 mg/dL — ABNORMAL HIGH (ref 70–99)
Glucose-Capillary: 200 mg/dL — ABNORMAL HIGH (ref 70–99)
Glucose-Capillary: 153 mg/dL — ABNORMAL HIGH (ref 70–99)
Glucose-Capillary: 140 mg/dL — ABNORMAL HIGH (ref 70–99)

## 2013-05-24 LAB — CBC
HCT: 31.6 % — ABNORMAL LOW (ref 36.0–46.0)
MCHC: 32.9 g/dL (ref 30.0–36.0)
MCV: 87.5 fL (ref 78.0–100.0)
Platelets: 189 10*3/uL (ref 150–400)
RDW: 13.9 % (ref 11.5–15.5)
WBC: 14.7 10*3/uL — ABNORMAL HIGH (ref 4.0–10.5)

## 2013-05-24 LAB — PROTIME-INR
INR: 2.17 — ABNORMAL HIGH (ref 0.00–1.49)
Prothrombin Time: 23.5 seconds — ABNORMAL HIGH (ref 11.6–15.2)

## 2013-05-24 LAB — MRSA PCR SCREENING: MRSA by PCR: POSITIVE — AB

## 2013-05-24 MED ORDER — POTASSIUM CHLORIDE 10 MEQ/100ML IV SOLN
10.0000 meq | INTRAVENOUS | Status: AC
  Administered 2013-05-24 (×3): 10 meq via INTRAVENOUS
  Filled 2013-05-24 (×3): qty 100

## 2013-05-24 MED ORDER — POTASSIUM CHLORIDE 10 MEQ/100ML IV SOLN: 10.0000 meq | INTRAVENOUS | Status: DC

## 2013-05-24 NOTE — Progress Notes (Signed)
  Subjective: Pain is essentially unchanged since last night.    Objective: Vital signs in last 24 hours: Temp:  [98 F (36.7 C)-100.1 F (37.8 C)] 98 F (36.7 C) (11/27 0400) Pulse Rate:  [72-112] 72 (11/27 0400) Resp:  [16-32] 25 (11/27 0400) BP: (103-163)/(51-75) 118/55 mmHg (11/27 0400) SpO2:  [97 %-98 %] 98 % (11/27 0400) Weight:  [179 lb 10.8 oz (81.5 kg)-181 lb 10.5 oz (82.4 kg)] 181 lb 10.5 oz (82.4 kg) (11/27 0400)    Intake/Output from previous day: 11/26 0701 - 11/27 0700 In: 1000 [I.V.:200; IV Piggyback:800] Out: 750 [Urine:750] Intake/Output this shift:    General appearance: alert, cooperative and no distress GI: bowel sounds are present.  abdomen is soft, non distended.  she exhibits moderate tenderness to palpation to RLQ and LLQ.  No guarding or evidence of peritonitis.    Lab Results:   Recent Labs  05/24/13 0525  WBC 14.7*  HGB 10.4*  HCT 31.6*  PLT 189   BMET  Recent Labs  05/24/13 0525  NA 136  K 3.0*  CL 97  CO2 30  GLUCOSE 179*  BUN 12  CREATININE 0.74  CALCIUM 9.4   PT/INR  Recent Labs  05/24/13 0525  LABPROT 23.5*  INR 2.17*   ABG No results found for this basename: PHART, PCO2, PO2, HCO3,  in the last 72 hours  Studies/Results: No results found.  Anti-infectives: Anti-infectives   Start     Dose/Rate Route Frequency Ordered Stop   05/23/13 2200  metroNIDAZOLE (FLAGYL) IVPB 500 mg     500 mg 100 mL/hr over 60 Minutes Intravenous Every 8 hours 05/23/13 2140     05/23/13 2145  ciprofloxacin (CIPRO) IVPB 400 mg     400 mg 200 mL/hr over 60 Minutes Intravenous Every 12 hours 05/23/13 2140        Assessment/Plan: Perforated Diverticulitis -Continue with conservative management, cipro/flagyl, bowel rest.  Hopefully we can avoid surgical intervention.  She understands this may be a possibility if this does not improve or she starts to worsen.   -pain control -antiemetics -IV hydration -repeat CBC in AM   LOS: 1  day    Alexandria [name] ANP-BC 05/24/2013 8:47 AM

## 2013-05-24 NOTE — Progress Notes (Signed)
Seen and agree  

## 2013-05-24 NOTE — Progress Notes (Signed)
[facility] PROGRESS NOTE  Alexandria [name] HKV:[id number] DOB: August 31, 1929 DOA: 05/23/2013 PCP: [name], MD  Assessment/Plan: 1-Perforated Diverticulitis: Continue with medical management. IV fluids, ciprofloxacin, flagyl. NPO status. Surgery following.  2-Hypertension: SBP in the 110 range. Hold Norvasc, Imdur.  3-History of A fib: rate controlled. Continue with IV metoprolol. Holding coumadin in case patient required surgery intervention.  4-Chronic diastolic Heart Failure; Compensated. Hold lasix due to NPO status. Monitor for pulmonary edema. Monitor on IV fluids. Hold Imdur due to soft SBP.  5-Diabetes: SSI. Hold long acting insulin due to NPO status.  6-ILD: stable.  7-Hypokalemia: replete with 4 runs KCL. Check Mg in am.    Code Status: Full Code.  Family Communication: Care discussed with patient.  Disposition Plan: Remain in the step down unit.    Consultants:  Surgery  Procedures:  none  Antibiotics:  Ciprofloxacin 11-26  Flagyl 11-26  HPI/Subjective: Abdominal pain the same. 7/10. No vomiting. No BM. Denies dyspnea.   Objective: Filed Vitals:   05/24/13 0400  BP: 118/55  Pulse: 72  Temp: 98 F (36.7 C)  Resp: 25    Intake/Output Summary (Last 24 hours) at 05/24/13 0741 Last data filed at 05/24/13 0600  Gross per 24 hour  Intake   1000 ml  Output    750 ml  Net    250 ml   Filed Weights   05/23/13 1915 05/24/13 0400  Weight: 81.5 kg (179 lb 10.8 oz) 82.4 kg (181 lb 10.5 oz)    Exam:   General:  No distress.   Cardiovascular: S 1, S 2 RRR   Respiratory: CTA  Abdomen: Bs present, soft, NT  Musculoskeletal: no edema.   Data Reviewed: Basic Metabolic Panel:  Recent Labs Lab 05/24/13 0525  NA 136  K 3.0*  CL 97  CO2 30  GLUCOSE 179*  BUN 12  CREATININE 0.74  CALCIUM 9.4   Liver Function Tests:  Recent Labs Lab 05/24/13 0525  AST 10  ALT 5  ALKPHOS 81  BILITOT 0.5  PROT 6.6  ALBUMIN 2.6*   No results  found for this basename: LIPASE, AMYLASE,  in the last 168 hours No results found for this basename: AMMONIA,  in the last 168 hours CBC:  Recent Labs Lab 05/24/13 0525  WBC 14.7*  HGB 10.4*  HCT 31.6*  MCV 87.5  PLT 189   Cardiac Enzymes: No results found for this basename: CKTOTAL, CKMB, CKMBINDEX, TROPONINI,  in the last 168 hours BNP (last 3 results)  Recent Labs  05/30/12 0256 06/24/12 1600 08/12/12 0948  PROBNP 1210.0* 1253.0* 301.2   CBG: No results found for this basename: GLUCAP,  in the last 168 hours  Recent Results (from the past 240 hour(s))  MRSA PCR SCREENING     Status: Abnormal   Collection Time    05/23/13 11:50 PM      Result Value Range Status   MRSA by PCR POSITIVE (*) NEGATIVE Final   Comment:            The GeneXpert MRSA Assay (FDA     approved for NASAL specimens     only), is one component of a     comprehensive MRSA colonization     surveillance program. It is not     intended to diagnose MRSA     infection nor to guide or     monitor treatment for     MRSA infections.     RESULT CALLED TO, READ BACK  BY AND VERIFIED WITH:     CALLED TO RN MELISSA HANCOCK [id number] @0605  [name]     Studies: No results found.  Scheduled Meds: . azelastine  2 spray Each Nare BID  . calcitonin (salmon)  1 spray Alternating Nares Daily  . ciprofloxacin  400 mg Intravenous Q12H  . [START ON 05/28/2013] cyanocobalamin  1,000 mcg Intramuscular Q30 days  . insulin aspart  0-15 Units Subcutaneous Q4H  . latanoprost  1 drop Both Eyes QHS  . levothyroxine  50 mcg Oral QAC breakfast  . lidocaine  1 patch Transdermal Q24H  . metoprolol  5 mg Intravenous Q6H  . metronidazole  500 mg Intravenous Q8H  . potassium chloride  10 mEq Intravenous Q1 Hr x 4  . sodium chloride  3 mL Intravenous Q12H   Continuous Infusions: . dextrose 5 % and 0.9% NaCl 50 mL/hr at 05/24/13 4401    Principal Problem:   Diverticulitis of colon (without mention of hemorrhage) Active  Problems:   DIABETES MELLITUS   HYPERLIPIDEMIA   OBESITY   HYPERTENSION   Atrial fibrillation   Occlusion and stenosis of carotid artery without mention of cerebral infarction   Chronic diastolic CHF (congestive heart failure)   Compression fracture of L1 lumbar vertebra   Cardiac pacemaker in situ   ILD (interstitial lung disease)   Diverticulitis    Time spent: 35 minutes.     REGALADO,BELKYS  [facility] Pager [phone]. If 7PM-7AM, please contact night-coverage at [email or web address], password [facility] 05/24/2013, 7:41 AM  LOS: 1 day

## 2013-05-25 ENCOUNTER — Inpatient Hospital Stay (HOSPITAL_COMMUNITY): Payer: Medicare Other

## 2013-05-25 ENCOUNTER — Encounter (HOSPITAL_COMMUNITY): Payer: Self-pay | Admitting: Cardiology

## 2013-05-25 DIAGNOSIS — I4892 Unspecified atrial flutter: Secondary | ICD-10-CM

## 2013-05-25 LAB — GLUCOSE, CAPILLARY
Glucose-Capillary: 142 mg/dL — ABNORMAL HIGH (ref 70–99)
Glucose-Capillary: 164 mg/dL — ABNORMAL HIGH (ref 70–99)
Glucose-Capillary: 171 mg/dL — ABNORMAL HIGH (ref 70–99)
Glucose-Capillary: 176 mg/dL — ABNORMAL HIGH (ref 70–99)
Glucose-Capillary: 203 mg/dL — ABNORMAL HIGH (ref 70–99)

## 2013-05-25 LAB — CBC
HCT: 32 % — ABNORMAL LOW (ref 36.0–46.0)
Hemoglobin: 10.3 g/dL — ABNORMAL LOW (ref 12.0–15.0)
MCHC: 32.2 g/dL (ref 30.0–36.0)
MCV: 87.4 fL (ref 78.0–100.0)
RBC: 3.66 MIL/uL — ABNORMAL LOW (ref 3.87–5.11)
RDW: 14 % (ref 11.5–15.5)

## 2013-05-25 LAB — BASIC METABOLIC PANEL
BUN: 10 mg/dL (ref 6–23)
Chloride: 98 mEq/L (ref 96–112)
Creatinine, Ser: 0.74 mg/dL (ref 0.50–1.10)
GFR calc non Af Amer: 78 mL/min — ABNORMAL LOW (ref >90)
Glucose, Bld: 154 mg/dL — ABNORMAL HIGH (ref 70–99)
Potassium: 3.3 mEq/L — ABNORMAL LOW (ref 3.5–5.1)

## 2013-05-25 LAB — PRO B NATRIURETIC PEPTIDE: Pro B Natriuretic peptide (BNP): 1061 pg/mL — ABNORMAL HIGH (ref 0–450)

## 2013-05-25 LAB — PROTIME-INR
INR: 2.68 — ABNORMAL HIGH (ref 0.00–1.49)
Prothrombin Time: 27.6 seconds — ABNORMAL HIGH (ref 11.6–15.2)

## 2013-05-25 MED ORDER — LEVALBUTEROL HCL 0.63 MG/3ML IN NEBU
0.6300 mg | INHALATION_SOLUTION | Freq: Four times a day (QID) | RESPIRATORY_TRACT | Status: DC | PRN
Start: 2013-05-25 — End: 2013-06-06
  Administered 2013-05-25: 0.63 mg via RESPIRATORY_TRACT
  Filled 2013-05-25: qty 3

## 2013-05-25 MED ORDER — FUROSEMIDE 10 MG/ML IJ SOLN
20.0000 mg | Freq: Once | INTRAMUSCULAR | Status: AC
  Administered 2013-05-25: 20 mg via INTRAVENOUS

## 2013-05-25 MED ORDER — FUROSEMIDE 10 MG/ML IJ SOLN
20.0000 mg | Freq: Once | INTRAMUSCULAR | Status: AC
  Administered 2013-05-26: 20 mg via INTRAVENOUS

## 2013-05-25 MED ORDER — POTASSIUM CHLORIDE 10 MEQ/100ML IV SOLN
10.0000 meq | INTRAVENOUS | Status: AC
  Administered 2013-05-25 (×3): 10 meq via INTRAVENOUS
  Filled 2013-05-25 (×4): qty 100

## 2013-05-25 MED ORDER — LEVOTHYROXINE SODIUM 100 MCG IV SOLR
25.0000 ug | Freq: Every day | INTRAVENOUS | Status: DC
  Administered 2013-05-25 – 2013-05-29 (×5): 25 ug via INTRAVENOUS
  Filled 2013-05-25 (×5): qty 5

## 2013-05-25 MED ORDER — DILTIAZEM LOAD VIA INFUSION
10.0000 mg | Freq: Once | INTRAVENOUS | Status: AC
  Administered 2013-05-25: 10 mg via INTRAVENOUS
  Filled 2013-05-25: qty 10

## 2013-05-25 MED ORDER — VITAMIN K1 10 MG/ML IJ SOLN
5.0000 mg | Freq: Once | INTRAVENOUS | Status: AC
  Administered 2013-05-25: 5 mg via INTRAVENOUS
  Filled 2013-05-25: qty 0.5

## 2013-05-25 MED ORDER — FUROSEMIDE 10 MG/ML IJ SOLN
INTRAMUSCULAR | Status: AC
  Administered 2013-05-25: 20 mg via INTRAVENOUS
  Filled 2013-05-25: qty 4

## 2013-05-25 MED ORDER — METOPROLOL TARTRATE 1 MG/ML IV SOLN
5.0000 mg | Freq: Once | INTRAVENOUS | Status: AC
  Administered 2013-05-25: 5 mg via INTRAVENOUS

## 2013-05-25 MED ORDER — MAGNESIUM SULFATE 40 MG/ML IJ SOLN
2.0000 g | Freq: Once | INTRAMUSCULAR | Status: AC
  Administered 2013-05-25: 2 g via INTRAVENOUS
  Filled 2013-05-25: qty 50

## 2013-05-25 MED ORDER — DEXTROSE 5 % IV SOLN
5.0000 mg/h | INTRAVENOUS | Status: DC
  Administered 2013-05-25: 5 mg/h via INTRAVENOUS
  Administered 2013-05-25: 15 mg/h via INTRAVENOUS
  Administered 2013-05-26: 10 mg/h via INTRAVENOUS
  Administered 2013-05-26 (×3): 15 mg/h via INTRAVENOUS
  Administered 2013-05-27: 10 mg/h via INTRAVENOUS
  Administered 2013-05-28 – 2013-05-30 (×5): 15 mg/h via INTRAVENOUS
  Filled 2013-05-25 (×9): qty 100

## 2013-05-25 MED ORDER — POTASSIUM CHLORIDE 10 MEQ/100ML IV SOLN
10.0000 meq | INTRAVENOUS | Status: AC
  Administered 2013-05-25: 10 meq via INTRAVENOUS
  Filled 2013-05-25: qty 100

## 2013-05-25 MED ORDER — FUROSEMIDE 10 MG/ML IJ SOLN
20.0000 mg | Freq: Once | INTRAMUSCULAR | Status: DC
  Filled 2013-05-25: qty 2

## 2013-05-25 MED ORDER — SODIUM CHLORIDE 0.9 % IV SOLN
1.0000 g | INTRAVENOUS | Status: DC
  Administered 2013-05-25 – 2013-05-27 (×3): 1 g via INTRAVENOUS
  Filled 2013-05-25 (×6): qty 1

## 2013-05-25 MED ORDER — FUROSEMIDE 10 MG/ML IJ SOLN: 20.0000 mg | Freq: Once | INTRAMUSCULAR | Status: DC

## 2013-05-25 MED ORDER — FUROSEMIDE 10 MG/ML IJ SOLN
40.0000 mg | Freq: Once | INTRAMUSCULAR | Status: AC
  Administered 2013-05-25: 40 mg via INTRAVENOUS
  Filled 2013-05-25: qty 4

## 2013-05-25 NOTE — Progress Notes (Addendum)
Dr. Sunnie Nielsen notified that pt's temp was 99.1 prior to FFP infusion and is 100.2 an hour into infusion; pt has no other s/s of blood reaction. Plan of care -- repeat temp per blood transfusion protocols; if subsequent temps are >101, page on-call surgeon. Renette Butters, Viona Gilmore

## 2013-05-25 NOTE — Progress Notes (Signed)
Her abdomen is quite tender in bilateral lower quadrants today. She states this is unchanged from admission.  I reviewed her ct from outside hospital. She states no bm or flatus.  Her wbc is improved a little today. Films don't show any free air or any obstructive pattern.  I am concerned about her exam. I am also concerned about afib which is now worse.  I think she may very well require operation and discussed that with her this am.  I changed abx to invanz today due to c/f resistance.  I also added inr which is 2.68.  She needs her inr reversed now but with history and elevated bnp this will need to be done carefully.  I think she may very well require surgery in near future ( I had discussed with her even possibly today) and inr needs to be normalized.

## 2013-05-25 NOTE — Progress Notes (Signed)
  Subjective: Pain has not improved or worsened.   Pt now in AF, cardiology consult.  Objective: Vital signs in last 24 hours: Temp:  [98.1 F (36.7 C)-99.7 F (37.6 C)] 98.1 F (36.7 C) (11/28 0411) Pulse Rate:  [90-114] 90 (11/28 0740) Resp:  [20-30] 28 (11/28 0740) BP: (136-142)/(52-70) 140/60 mmHg (11/28 0740) SpO2:  [99 %-100 %] 99 % (11/28 0740) Last BM Date:  (PTA)  Intake/Output from previous day: 11/27 0701 - 11/28 0700 In: 1309.2 [P.O.:140; I.V.:769.2; IV Piggyback:400] Out: 1275 [Urine:1275] Intake/Output this shift:    General appearance: alert, cooperative and no distress  GI: bowel sounds are present. abdomen is soft, non distended. she exhibits moderate tenderness to palpation to RLQ and LLQ. No guarding or evidence of peritonitis.    Lab Results:   Recent Labs  05/24/13 0525 05/25/13 0430  WBC 14.7* 11.2*  HGB 10.4* 10.3*  HCT 31.6* 32.0*  PLT 189 186   BMET  Recent Labs  05/24/13 0525 05/25/13 0430  NA 136 139  K 3.0* 3.3*  CL 97 98  CO2 30 30  GLUCOSE 179* 154*  BUN 12 10  CREATININE 0.74 0.74  CALCIUM 9.4 9.8   PT/INR  Recent Labs  05/24/13 0525  LABPROT 23.5*  INR 2.17*   ABG No results found for this basename: PHART, PCO2, PO2, HCO3,  in the last 72 hours  Studies/Results: Dg Chest Port 1 View  05/25/2013   CLINICAL DATA:  Dyspnea with history of CHF and hypertension.  EXAM: PORTABLE CHEST - 1 VIEW  COMPARISON:  Portable chest x-ray dated August 12, 2012.  FINDINGS: The right hemidiaphragm remains higher than the left. Both lungs are slightly less well inflated today. There is no alveolar infiltrate but the interstitial markings are more prominent today than in the past. The cardiac silhouette is top-normal in size. There is prominence the right hilar structures which is stable. The pulmonary vascularity is also prominent centrally. A permanent pacemaker is not changed in appearance and position.  IMPRESSION: There is low  grade CHF with mild interstitial edema which is accentuated by mild hypoinflation of both lungs and chronic elevation of the right hemidiaphragm. No focal pneumonia is demonstrated.   Electronically Signed   By: David  [place]   On: 05/25/2013 09:35    Anti-infectives: Anti-infectives   Start     Dose/Rate Route Frequency Ordered Stop   05/23/13 2200  metroNIDAZOLE (FLAGYL) IVPB 500 mg     500 mg 100 mL/hr over 60 Minutes Intravenous Every 8 hours 05/23/13 2140     05/23/13 2145  ciprofloxacin (CIPRO) IVPB 400 mg     400 mg 200 mL/hr over 60 Minutes Intravenous Every 12 hours 05/23/13 2140        Assessment/Plan: Perforated Diverticulitis  -abdominal exam is essentially unchanged from yesterday, although her white count is trending down.  Will discuss with Dr. Dwain Sarna regarding continuing with conservative management, cipro/flagyl, bowel rest or surgery.  -pain control  -antiemetics  -IV hydration  -repeat CBC in AM   LOS: 2 days    Ramonita Koenig ANP-BC 05/25/2013 10:00 AM

## 2013-05-25 NOTE — Progress Notes (Signed)
Dr. Sunnie Nielsen notified that pt's become dyspneic with breaths in 20s-30s. Crackles in lungs have worsened since this morning. Plan of care -- give one-time dose of Lasix 20 mg IV and Dr. Sunnie Nielsen requests that I call cardiology to inform them. Renette Butters, Viona Gilmore

## 2013-05-25 NOTE — Progress Notes (Signed)
Utilization review completed.  

## 2013-05-25 NOTE — Progress Notes (Signed)
Dr. Jens Som notified of pt's change in status (see prior note) -- plan of care: give an additional 20 mg IV Lasix now and give 40 mg IV Lasix between units of FFP. Renette Butters, Viona Gilmore

## 2013-05-25 NOTE — Progress Notes (Signed)
[facility] PROGRESS NOTE  Alexandria [name] ZOX:[id number] DOB: [birth year] DOA: 05/23/2013 PCP: [name], MD  Assessment/Plan: 1-Perforated Diverticulitis: Continue with ciprofloxacin, flagyl. NPO status. Surgery following. Abdominal pain the same. WBC trending down at 11 from 14.   2-Hypertension: Hold Norvasc, Imdur.   3-History of A fib: Continue with IV metoprolol. Holding coumadin in case patient required surgery intervention. Check INR.   4-Chronic diastolic Heart Failure; appears more dyspneic today. Will decrease IV fluids to 30 CC/Hr. Will order BNP, Chest x ray. I have consulted cardiology.   5-Diabetes: SSI. Hold long acting insulin due to NPO status.   6-ILD: stable.   7-Hypokalemia: replete with 4 runs KCL. Replete Mg IV.    Code Status: Full Code.  Family Communication: Care discussed with patient.  Disposition Plan: Remain in the step down unit.    Consultants:  Surgery  Procedures:  none  Antibiotics:  Ciprofloxacin 11-26  Flagyl 11-26  HPI/Subjective: Abdominal pain the same. Relates nausea. Wants something to drink.  Feels she is breathing ok.   Objective: Filed Vitals:   05/25/13 0740  BP: 140/60  Pulse: 90  Temp:   Resp: 28    Intake/Output Summary (Last 24 hours) at 05/25/13 0848 Last data filed at 05/25/13 0700  Gross per 24 hour  Intake 1309.17 ml  Output   1275 ml  Net  34.17 ml   Filed Weights   05/23/13 1915 05/24/13 0400  Weight: 81.5 kg (179 lb 10.8 oz) 82.4 kg (181 lb 10.5 oz)    Exam:   General:  No distress.   Cardiovascular: S 1, S 2 RRR   Respiratory: Decrease breath sound, crackles bases.   Abdomen: Bs present, soft, tenderness.   Musculoskeletal: no edema.   Data Reviewed: Basic Metabolic Panel:  Recent Labs Lab 05/24/13 0525 05/25/13 0430  NA 136 139  K 3.0* 3.3*  CL 97 98  CO2 30 30  GLUCOSE 179* 154*  BUN 12 10  CREATININE 0.74 0.74  CALCIUM 9.4 9.8  MG  --  1.4*   Liver  Function Tests:  Recent Labs Lab 05/24/13 0525  AST 10  ALT 5  ALKPHOS 81  BILITOT 0.5  PROT 6.6  ALBUMIN 2.6*   No results found for this basename: LIPASE, AMYLASE,  in the last 168 hours No results found for this basename: AMMONIA,  in the last 168 hours CBC:  Recent Labs Lab 05/24/13 0525 05/25/13 0430  WBC 14.7* 11.2*  HGB 10.4* 10.3*  HCT 31.6* 32.0*  MCV 87.5 87.4  PLT 189 186   Cardiac Enzymes: No results found for this basename: CKTOTAL, CKMB, CKMBINDEX, TROPONINI,  in the last 168 hours BNP (last 3 results)  Recent Labs  05/30/12 0256 06/24/12 1600 08/12/12 0948  PROBNP 1210.0* 1253.0* 301.2   CBG:  Recent Labs Lab 05/24/13 1519 05/24/13 1926 05/24/13 2348 05/25/13 0403 05/25/13 0805  GLUCAP 153* 140* 164* 150* 142*    Recent Results (from the past 240 hour(s))  MRSA PCR SCREENING     Status: Abnormal   Collection Time    05/23/13 11:50 PM      Result Value Range Status   MRSA by PCR POSITIVE (*) NEGATIVE Final   Comment:            The GeneXpert MRSA Assay (FDA     approved for NASAL specimens     only), is one component of a     comprehensive MRSA colonization     surveillance  program. It is not     intended to diagnose MRSA     infection nor to guide or     monitor treatment for     MRSA infections.     RESULT CALLED TO, READ BACK BY AND VERIFIED WITH:     CALLED TO RN MELISSA HANCOCK [phone] @0605  [name]     Studies: No results found.  Scheduled Meds: . azelastine  2 spray Each Nare BID  . calcitonin (salmon)  1 spray Alternating Nares Daily  . ciprofloxacin  400 mg Intravenous Q12H  . [START ON 05/28/2013] cyanocobalamin  1,000 mcg Intramuscular Q30 days  . insulin aspart  0-15 Units Subcutaneous Q4H  . latanoprost  1 drop Both Eyes QHS  . levothyroxine  25 mcg Intravenous Daily  . lidocaine  1 patch Transdermal Q24H  . metoprolol  5 mg Intravenous Q6H  . metronidazole  500 mg Intravenous Q8H  . potassium chloride  10 mEq  Intravenous Q1 Hr x 4  . sodium chloride  3 mL Intravenous Q12H   Continuous Infusions: . dextrose 5 % and 0.9% NaCl 50 mL/hr at 05/25/13 0547    Principal Problem:   Diverticulitis of colon (without mention of hemorrhage) Active Problems:   DIABETES MELLITUS   HYPERLIPIDEMIA   OBESITY   HYPERTENSION   Atrial fibrillation   Occlusion and stenosis of carotid artery without mention of cerebral infarction   Chronic diastolic CHF (congestive heart failure)   Compression fracture of L1 lumbar vertebra   Cardiac pacemaker in situ   ILD (interstitial lung disease)   Diverticulitis    Time spent: 35 minutes.     Tae Robak  [facility] Pager [phone]. If 7PM-7AM, please contact night-coverage at [email or web address], password [facility] 05/25/2013, 8:48 AM  LOS: 2 days

## 2013-05-25 NOTE — Consult Note (Signed)
Primary cardiologist - Hochrein  HPI: 79 year old female with past medical history of coronary artery disease, paroxysmal atrial fibrillation, diastolic congestive heart failure, interstitial lung disease, hypertension and diabetes mellitus now with perforated diverticulitis for evaluation of atrial flutter and diastolic congestive heart failure. Last echocardiogram in March of 2013 showed normal LV function and mild aortic stenosis. Patient has had previous PCI. Last catheterization in June of 2013 showed normal LV function. There was a small caliber first diagonal with a 90% stenosis in the inferior subbranch. It was too small for PCI. The circumflex had a 30% lesion. There was a 95% third of 2 small marginal which was very small caliber and too small for PCI. No obstructive disease in the right coronary artery. Patient also with history of paroxysmal atrial fibrillation. Based on notes tikosyn and amiodarone discontinued in the past because of perceived symptoms. She is maintained on sotalol. Patient was in a rehabilitation facility for recent L1 compression fracture. 3 days prior to admission she experienced abdominal pain. A CT scan revealed perforated diverticulitis and she was transferred to [facility] for evaluation. She is being treated with antibiotics. She has been noted to have paroxysmal atrial flutter and cardiology is asked to evaluate. Patient denies dyspnea, chest pain, palpitations, pedal edema or syncope.  Medications Prior to Admission  Medication Dose Route Frequency Provider Last Rate Last Dose  . cyanocobalamin ((VITAMIN B-12)) injection 1,000 mcg  1,000 mcg Intramuscular Q30 days Tammy Eckard, PHARMD   1,000 mcg at 03/08/13 1147   Medications Prior to Admission  Medication Sig Dispense Refill  . acetaminophen (TYLENOL) 325 MG tablet Take 650 mg by mouth every 4 (four) hours as needed for mild pain or fever.      Marland Kitchen aluminum-magnesium hydroxide-simethicone (MAALOX)  200-200-20 MG/5ML SUSP Take 20 mLs by mouth 3 (three) times daily as needed (indigestion).      Marland Kitchen amLODipine (NORVASC) 5 MG tablet Take 5 mg by mouth daily.      Marland Kitchen azelastine (ASTELIN) 137 MCG/SPRAY nasal spray Place 2 sprays into both nostrils 2 (two) times daily. Use in each nostril as directed      . bismuth subsalicylate (PEPTO BISMOL) 262 MG/15ML suspension Take 30 mLs by mouth as needed for diarrhea or loose stools.      . calcitonin, salmon, (MIACALCIN/FORTICAL) 200 UNIT/ACT nasal spray Place 1 spray into alternate nostrils daily.      Marland Kitchen dextrose 5 % SOLN 50 mL with cefTRIAXone 1 G SOLR 1 g Inject 1 g into the vein daily.      . furosemide (LASIX) 80 MG tablet Take 1 tablet (80 mg total) by mouth 2 (two) times daily.  60 tablet  10  . guaiFENesin (ROBITUSSIN) 100 MG/5ML SOLN Take 10 mLs by mouth every 6 (six) hours as needed for cough or to loosen phlegm.      Marland Kitchen HYDROcodone-acetaminophen (NORCO/VICODIN) 5-325 MG per tablet Take 1 tablet by mouth every 4 (four) hours as needed for moderate pain.      Marland Kitchen insulin aspart (NOVOLOG FLEXPEN) 100 UNIT/ML injection Inject 10 Units into the skin 3 (three) times daily before meals. Inject 10 units before breakfast and lunch and 8 units before supper  1 vial    . insulin glargine (LANTUS) 100 UNIT/ML injection Inject 8 Units into the skin at bedtime.      . isosorbide mononitrate (IMDUR) 60 MG 24 hr tablet Take 60 mg by mouth daily.      Marland Kitchen latanoprost (XALATAN)  0.005 % ophthalmic solution Place 1 drop into both eyes at bedtime.       Marland Kitchen levothyroxine (SYNTHROID, LEVOTHROID) 50 MCG tablet Take 50 mcg by mouth daily before breakfast.      . lidocaine (LIDODERM) 5 % Place 1 patch onto the skin daily. Left lumbar area; Remove & Discard patch within 12 hours or as directed by MD      . LORazepam (ATIVAN) 1 MG tablet Take 1 mg by mouth every 8 (eight) hours as needed for anxiety.      . metoprolol (LOPRESSOR) 50 MG tablet Take 50 mg by mouth 2 (two) times daily.       Marland Kitchen warfarin (COUMADIN) 4 MG tablet Take 4 mg by mouth daily.      . white petrolatum (VASELINE) GEL Apply 1 application topically as needed for dry skin.      Marland Kitchen senna (SENOKOT) 8.6 MG tablet Take 1 tablet by mouth 2 (two) times daily as needed for constipation.        Allergies  Allergen Reactions  . Prednisone Other (See Comments)    REACTION: "retained fluid"  . Ace Inhibitors Cough       . Crestor [Rosuvastatin Calcium] Other (See Comments)    Fatigue and leg pain   . Lipitor [Atorvastatin Calcium] Other (See Comments)    Myalgia and fatigue   . Niaspan [Niacin] Other (See Comments)    unknown  . Olmesartan Cough    Past Medical History  Diagnosis Date  . Hypertension     with h/o hypertensive urgency  . Glaucoma   . Tremor, essential   . Diabetes mellitus with neuropathy   . B12 deficiency   . Hyperlipidemia   . Atrial fibrillation     a. on chronic anticoagulation with warfarin  b. Tiksosyn discontinued and amiodarone  was discontinued secondary to perceived symptoms  . Chronic diastolic heart failure     a. echo 05/2009: mild LVH, EF 55-60%, grade 2 diast dysfxn, mild LAE, PASP;   b. Echo 3/13:   mod LVH, EF 55-60%, mild to mod calc AV annulus, very mild AS (mean 9 mmHg)  . CAD (coronary artery disease)     a.  b. 07/2011 NSTEMI - PCI/DES LCX - Resolute stent;;   b. LHC 12/27/11: mLAD 30%, prox-mid D1 90% (small);  CFX patent stent, OM3 small 95%,, mRCA 30%, EF 65%  -  med Rx;   . Carotid stenosis     a. dopplers 6/12: 1-39% bilat ICA; b. carotid dopplers 7/13: bilat 1-39% ICA  . Pulmonary fibrosis     Dr. Vassie Loll;  patient taken off O2 in 8/12; dyspnea felt CHF>>ILD  . Skin cancer   . Colon polyps   . Syncope     unknown etiology  . Bradycardia   . Pacemaker 02/29/2012  . Diabetes mellitus     insulin controled  . Elevated TSH 05/2012  . Normocytic anemia 05/2012    Past Surgical History  Procedure Laterality Date  . Appendectomy    . Nose surgery    .  Colonoscopy    . Cardiac catheterization  2013    stent  . Coronary angioplasty with stent placement    . Insert / replace / remove pacemaker  02/29/2012  . Dilation and curettage of uterus      History   Social History  . Marital Status: Widowed    Spouse Name: N/A    Number of Children: 1  . Years of  Education: N/A   Occupational History  . retired    Social History Main Topics  . Smoking status: Never Smoker   . Smokeless tobacco: Never Used  . Alcohol Use: No  . Drug Use: No  . Sexual Activity: No   Other Topics Concern  . Not on file   Social History Narrative   Widow, lives alone at home.    Family History  Problem Relation Age of Onset  . Throat cancer Brother   . Prostate cancer Father   . Diabetes Mother   . Diabetes Brother     multiple  . Heart disease Brother   . Colon cancer Neg Hx     ROS:  Abdominal pain but no fevers or chills, productive cough, hemoptysis, dysphasia, odynophagia, melena, hematochezia, dysuria, hematuria, rash, seizure activity, orthopnea, PND, pedal edema, claudication. Remaining systems are negative.  Physical Exam:   Blood pressure 140/60, pulse 90, temperature 98.1 F (36.7 C), temperature source Oral, resp. rate 28, height 5\' 3"  (1.6 m), weight 181 lb 10.5 oz (82.4 kg), SpO2 99.00%.  General:  Well developed/well nourished in NAD Skin warm/dry Patient not depressed No peripheral clubbing Back-normal HEENT-normal/normal eyelids Neck supple/normal carotid upstroke bilaterally; no bruits; no JVD; no thyromegaly chest - Basilar crackles CV - irregular and tachycardic/normal S1 and S2; no murmurs, rubs or gallops;  PMI nondisplaced Abdomen -diffuse tenderness to palpation. No masses palpated. Hypoactive bowel sounds. 2+ femoral pulses, no bruits Ext-no edema, chords, 2+ DP Neuro-grossly nonfocal  ECG atrial flutter at a rate of 130. Prior septal infarct. Nonspecific ST changes. Prior inferior infarct.  Results for  orders placed during the hospital encounter of 05/23/13 (from the past 48 hour(s))  GLUCOSE, CAPILLARY     Status: Abnormal   Collection Time    05/23/13 11:03 PM      Result Value Range   Glucose-Capillary 191 (*) 70 - 99 mg/dL  MRSA PCR SCREENING     Status: Abnormal   Collection Time    05/23/13 11:50 PM      Result Value Range   MRSA by PCR POSITIVE (*) NEGATIVE   Comment:            The GeneXpert MRSA Assay (FDA     approved for NASAL specimens     only), is one component of a     comprehensive MRSA colonization     surveillance program. It is not     intended to diagnose MRSA     infection nor to guide or     monitor treatment for     MRSA infections.     RESULT CALLED TO, READ BACK BY AND VERIFIED WITH:     CALLED TO RN MELISSA HANCOCK [id number] @[id number]  [name]  GLUCOSE, CAPILLARY     Status: Abnormal   Collection Time    05/24/13  4:00 AM      Result Value Range   Glucose-Capillary 184 (*) 70 - 99 mg/dL   Comment 1 Notify RN     Comment 2 Documented in Chart    COMPREHENSIVE METABOLIC PANEL     Status: Abnormal   Collection Time    05/24/13  5:25 AM      Result Value Range   Sodium 136  135 - 145 mEq/L   Potassium 3.0 (*) 3.5 - 5.1 mEq/L   Chloride 97  96 - 112 mEq/L   CO2 30  19 - 32 mEq/L   Glucose, Bld 179 (*) 70 - 99  mg/dL   BUN 12  6 - 23 mg/dL   Creatinine, Ser 2.13  0.50 - 1.10 mg/dL   Calcium 9.4  8.4 - 08.6 mg/dL   Total Protein 6.6  6.0 - 8.3 g/dL   Albumin 2.6 (*) 3.5 - 5.2 g/dL   AST 10  0 - 37 U/L   ALT 5  0 - 35 U/L   Alkaline Phosphatase 81  39 - 117 U/L   Total Bilirubin 0.5  0.3 - 1.2 mg/dL   GFR calc non Af Amer 78 (*) >90 mL/min   GFR calc Af Amer >90  >90 mL/min   Comment: (NOTE)     The eGFR has been calculated using the CKD EPI equation.     This calculation has not been validated in all clinical situations.     eGFR's persistently <90 mL/min signify possible Chronic Kidney     Disease.  PROTIME-INR     Status: Abnormal   Collection Time     05/24/13  5:25 AM      Result Value Range   Prothrombin Time 23.5 (*) 11.6 - 15.2 seconds   INR 2.17 (*) 0.00 - 1.49  CBC     Status: Abnormal   Collection Time    05/24/13  5:25 AM      Result Value Range   WBC 14.7 (*) 4.0 - 10.5 K/uL   RBC 3.61 (*) 3.87 - 5.11 MIL/uL   Hemoglobin 10.4 (*) 12.0 - 15.0 g/dL   HCT 57.8 (*) 46.9 - 62.9 %   MCV 87.5  78.0 - 100.0 fL   MCH 28.8  26.0 - 34.0 pg   MCHC 32.9  30.0 - 36.0 g/dL   RDW 52.8  41.3 - 24.4 %   Platelets 189  150 - 400 K/uL  GLUCOSE, CAPILLARY     Status: Abnormal   Collection Time    05/24/13 12:28 PM      Result Value Range   Glucose-Capillary 200 (*) 70 - 99 mg/dL  GLUCOSE, CAPILLARY     Status: Abnormal   Collection Time    05/24/13  3:19 PM      Result Value Range   Glucose-Capillary 153 (*) 70 - 99 mg/dL   Comment 1 Notify RN     Comment 2 Documented in Chart    GLUCOSE, CAPILLARY     Status: Abnormal   Collection Time    05/24/13  7:26 PM      Result Value Range   Glucose-Capillary 140 (*) 70 - 99 mg/dL   Comment 1 Notify RN    GLUCOSE, CAPILLARY     Status: Abnormal   Collection Time    05/24/13 11:48 PM      Result Value Range   Glucose-Capillary 164 (*) 70 - 99 mg/dL   Comment 1 Notify RN    GLUCOSE, CAPILLARY     Status: Abnormal   Collection Time    05/25/13  4:03 AM      Result Value Range   Glucose-Capillary 150 (*) 70 - 99 mg/dL  CBC     Status: Abnormal   Collection Time    05/25/13  4:30 AM      Result Value Range   WBC 11.2 (*) 4.0 - 10.5 K/uL   RBC 3.66 (*) 3.87 - 5.11 MIL/uL   Hemoglobin 10.3 (*) 12.0 - 15.0 g/dL   HCT 01.0 (*) 27.2 - 53.6 %   MCV 87.4  78.0 - 100.0 fL  MCH 28.1  26.0 - 34.0 pg   MCHC 32.2  30.0 - 36.0 g/dL   RDW 96.0  45.4 - 09.8 %   Platelets 186  150 - 400 K/uL  BASIC METABOLIC PANEL     Status: Abnormal   Collection Time    05/25/13  4:30 AM      Result Value Range   Sodium 139  135 - 145 mEq/L   Potassium 3.3 (*) 3.5 - 5.1 mEq/L   Chloride 98  96 - 112  mEq/L   CO2 30  19 - 32 mEq/L   Glucose, Bld 154 (*) 70 - 99 mg/dL   BUN 10  6 - 23 mg/dL   Creatinine, Ser 1.19  0.50 - 1.10 mg/dL   Calcium 9.8  8.4 - 14.7 mg/dL   GFR calc non Af Amer 78 (*) >90 mL/min   GFR calc Af Amer >90  >90 mL/min   Comment: (NOTE)     The eGFR has been calculated using the CKD EPI equation.     This calculation has not been validated in all clinical situations.     eGFR's persistently <90 mL/min signify possible Chronic Kidney     Disease.  MAGNESIUM     Status: Abnormal   Collection Time    05/25/13  4:30 AM      Result Value Range   Magnesium 1.4 (*) 1.5 - 2.5 mg/dL  GLUCOSE, CAPILLARY     Status: Abnormal   Collection Time    05/25/13  8:05 AM      Result Value Range   Glucose-Capillary 142 (*) 70 - 99 mg/dL    Dg Chest Port 1 View  05/25/2013   CLINICAL DATA:  Dyspnea with history of CHF and hypertension.  EXAM: PORTABLE CHEST - 1 VIEW  COMPARISON:  Portable chest x-ray dated August 12, 2012.  FINDINGS: The right hemidiaphragm remains higher than the left. Both lungs are slightly less well inflated today. There is no alveolar infiltrate but the interstitial markings are more prominent today than in the past. The cardiac silhouette is top-normal in size. There is prominence the right hilar structures which is stable. The pulmonary vascularity is also prominent centrally. A permanent pacemaker is not changed in appearance and position.  IMPRESSION: There is low grade CHF with mild interstitial edema which is accentuated by mild hypoinflation of both lungs and chronic elevation of the right hemidiaphragm. No focal pneumonia is demonstrated.   Electronically Signed   By: David  [place]   On: 05/25/2013 09:35    Assessment/Plan 1 paroxysmal atrial fibrillation/flutter-the patient has developed recurrent atrial flutter. Rate is elevated. However she is asymptomatic at this point. She remains n.p.o. given perforated diverticulitis. She apparently has not  tolerated amiodarone previously. It also does not appear that she was taking her sotalol on admission. For now I would plan rate control. Continue IV metoprolol and add IV Cardizem. Coumadin is on hold as she may require surgical intervention for perforated diverticulitis. Once her diverticulitis is treated and improves would resume Coumadin and we can consider cardioverting in the future and resuming sotalol once her INR is therapeutic. 2 chronic diastolic congestive heart failure-the patient appears to be euvolemic on examination. She does have crackles but this may be related to her interstitial lung disease. Diuretics on hold. Would be careful with hydration given history of diastolic congestive heart failure. 3 perforated diverticulitis-management per general surgery. Continue antibiotics. 4 history of CAD 5 diabetes mellitus-follow CBG.  Alexandria [name]  Crenshaw MD 05/25/2013, 10:43 AM

## 2013-05-25 NOTE — Progress Notes (Signed)
Dr. Sunnie Nielsen notified that IV team recommends a PICC line. Pt went back into a-fib around 0930--obtained EKG to confirm. Dr. Jens Som at bedside at 1020. Order received to place PICC line. Renette Butters, Viona Gilmore

## 2013-05-26 DIAGNOSIS — I5033 Acute on chronic diastolic (congestive) heart failure: Secondary | ICD-10-CM

## 2013-05-26 LAB — CBC
HCT: 34.2 % — ABNORMAL LOW (ref 36.0–46.0)
Hemoglobin: 11.1 g/dL — ABNORMAL LOW (ref 12.0–15.0)
RBC: 3.88 MIL/uL (ref 3.87–5.11)
WBC: 11.4 10*3/uL — ABNORMAL HIGH (ref 4.0–10.5)

## 2013-05-26 LAB — BASIC METABOLIC PANEL
BUN: 8 mg/dL (ref 6–23)
Calcium: 9.5 mg/dL (ref 8.4–10.5)
Chloride: 96 mEq/L (ref 96–112)
GFR calc Af Amer: >90 mL/min (ref >90)
GFR calc non Af Amer: 82 mL/min — ABNORMAL LOW (ref >90)
Glucose, Bld: 208 mg/dL — ABNORMAL HIGH (ref 70–99)
Potassium: 3.8 mEq/L (ref 3.5–5.1)
Sodium: 137 mEq/L (ref 135–145)

## 2013-05-26 LAB — GLUCOSE, CAPILLARY
Glucose-Capillary: 196 mg/dL — ABNORMAL HIGH (ref 70–99)
Glucose-Capillary: 219 mg/dL — ABNORMAL HIGH (ref 70–99)
Glucose-Capillary: 230 mg/dL — ABNORMAL HIGH (ref 70–99)

## 2013-05-26 LAB — PREPARE FRESH FROZEN PLASMA: Unit division: 0

## 2013-05-26 MED ORDER — CHLORHEXIDINE GLUCONATE CLOTH 2 % EX PADS
6.0000 | MEDICATED_PAD | Freq: Every day | CUTANEOUS | Status: DC
  Administered 2013-05-27 – 2013-06-06 (×10): 6 via TOPICAL

## 2013-05-26 MED ORDER — MUPIROCIN 2 % EX OINT
TOPICAL_OINTMENT | Freq: Two times a day (BID) | CUTANEOUS | Status: DC
  Administered 2013-05-26 – 2013-06-06 (×23): via NASAL
  Filled 2013-05-26 (×4): qty 22

## 2013-05-26 MED ORDER — DILTIAZEM LOAD VIA INFUSION
10.0000 mg | Freq: Once | INTRAVENOUS | Status: AC
  Administered 2013-05-26: 10 mg via INTRAVENOUS
  Filled 2013-05-26: qty 10

## 2013-05-26 MED ORDER — DILTIAZEM HCL 25 MG/5ML IV SOLN: 10.0000 mg | Freq: Once | INTRAVENOUS | Status: DC

## 2013-05-26 MED ORDER — METOPROLOL TARTRATE 1 MG/ML IV SOLN
5.0000 mg | INTRAVENOUS | Status: DC
  Administered 2013-05-26 – 2013-05-29 (×16): 5 mg via INTRAVENOUS
  Filled 2013-05-26 (×18): qty 5

## 2013-05-26 MED ORDER — FUROSEMIDE 10 MG/ML IJ SOLN
INTRAMUSCULAR | Status: AC
  Filled 2013-05-26: qty 4

## 2013-05-26 NOTE — Progress Notes (Signed)
Explained risks and benefits of PICC line procedure.  Pt. Unsure wants PICC and is disagreeable.  Will follow up later.

## 2013-05-26 NOTE — Progress Notes (Signed)
HR continues to maintain at in the low 120s. BP 144/70. Patient denies pain and is afebrile. Daphane Shepherd notified. New orders received. Will continue to monitor.   Rochele Pages, RN

## 2013-05-26 NOTE — Progress Notes (Signed)
RR low 30s, expiratory wheezes and fine crackles present upon ascultation, Xopenex administered, SpO2 95% on 3L [place]. HR 120-122 on Cardizem gtt. Daphane Shepherd notified. New orders received. Will continue to monitor.   Rochele Pages, RN

## 2013-05-26 NOTE — Progress Notes (Signed)
    Subjective:  Denies CP or dyspnea; mild abdominal pain; mildly confused   Objective:  Filed Vitals:   05/26/13 0200 05/26/13 0400 05/26/13 0457 05/26/13 0600  BP: 125/74 144/70  112/67  Pulse: 115 122  121  Temp:   98.3 F (36.8 C)   TempSrc:   Oral   Resp: 26 23  24   Height:      Weight:      SpO2: 94% 93%  93%    Intake/Output from previous day:  Intake/Output Summary (Last 24 hours) at 05/26/13 0804 Last data filed at 05/26/13 0600  Gross per 24 hour  Intake 1995.83 ml  Output   3075 ml  Net -1079.17 ml    Physical Exam: Physical exam: Well-developed well-nourished in no acute distress.  Skin is warm and dry.  HEENT is normal.  Neck is supple.  Chest with mild basilar crackles Cardiovascular exam is tachycardic  Abdominal exam mild tenderness to palpation Extremities show no edema. neuro grossly intact    Lab Results: Basic Metabolic Panel:  Recent Labs  16/10/96 0430 05/26/13 0457  NA 139 137  K 3.3* 3.8  CL 98 96  CO2 30 28  GLUCOSE 154* 208*  BUN 10 8  CREATININE 0.74 0.62  CALCIUM 9.8 9.5  MG 1.4*  --    CBC:  Recent Labs  05/25/13 0430 05/26/13 0457  WBC 11.2* 11.4*  HGB 10.3* 11.1*  HCT 32.0* 34.2*  MCV 87.4 88.1  PLT 186 191     Assessment/Plan:  1 paroxysmal atrial fibrillation/flutter-the patient has developed recurrent atrial flutter. Rate remains elevated. However she is asymptomatic at this point. She remains n.p.o. given perforated diverticulitis. She apparently has not tolerated amiodarone previously. It also does not appear that she was taking her sotalol on admission. For now I would plan rate control. Continue IV metoprolol (change to 5 mg IV every 4 hours) and IV Cardizem. Coumadin is on hold as she may require surgical intervention for perforated diverticulitis. Once her diverticulitis is treated and improves would resume Coumadin and we can consider cardioverting in the future and resuming sotalol once her INR is  therapeutic.  2 chronic diastolic congestive heart failure-the patient appears to be euvolemic on examination. She does have crackles but this may be related to her interstitial lung disease. Diuretics on hold. Would be careful with hydration given history of diastolic congestive heart failure. Would keep I and O approximately equal. 3 perforated diverticulitis-management per general surgery. Continue antibiotics.  4 history of CAD  5 diabetes mellitus-follow CBG.   Olga Millers 05/26/2013, 8:04 AM

## 2013-05-26 NOTE — Progress Notes (Signed)
Received a called from central telemetry that the patient has converted to normal sinus at this time.

## 2013-05-26 NOTE — Progress Notes (Signed)
[facility] PROGRESS NOTE  Alexandria [name]:[id number] DOB: 1930-02-25 DOA: 05/23/2013 PCP: [name], MD  Assessment/Plan: 1-Perforated Diverticulitis: Continue with INVANZ day 2.  Patient received ciprofloxacin  and flagyl for one day. NPO status. Surgery following. WBC trending  at 11 from 14. INR corrected at 1.3. Surgery will continue to follow up on patient later today.   2-Hypertension: Hold Norvasc, Imdur.   3-History of A fib: Continue with IV metoprolol, Cardizem Gtt. Holding coumadin in case patient required surgery intervention. INR corrected with FFP 11-28  and vitamin K. INR at 1.3.   4-Chronic diastolic Heart Failure; received 2 doses of IV lasix 40 mg in between FFP. Breathing better. Cardiology following.   5-Diabetes: SSI. Hold long acting insulin due to NPO status.   6-ILD: stable.   7-Hypokalemia: replaced. Repeat mg in am.    Code Status: Full Code.  Family Communication: Care discussed with patient.  Disposition Plan: Remain in the step down unit.    Consultants:  Surgery  Procedures:  none  Antibiotics:  Ciprofloxacin 11-26---11-27  Flagyl 11-26----11-27  HPI/Subjective: Abdominal pain better. Breathing ok. No nausea. Passing gas. She wants water.   Objective: Filed Vitals:   05/26/13 0600  BP: 112/67  Pulse: 121  Temp:   Resp: 24    Intake/Output Summary (Last 24 hours) at 05/26/13 0804 Last data filed at 05/26/13 0600  Gross per 24 hour  Intake 1995.83 ml  Output   3075 ml  Net -1079.17 ml   Filed Weights   05/23/13 1915 05/24/13 0400  Weight: 81.5 kg (179 lb 10.8 oz) 82.4 kg (181 lb 10.5 oz)    Exam:   General:  No distress.   Cardiovascular: S 1, S 2 RRR   Respiratory: Decrease breath sound, crackles bases.   Abdomen: Bs present, soft, tenderness.   Musculoskeletal: no edema.   Data Reviewed: Basic Metabolic Panel:  Recent Labs Lab 05/24/13 0525 05/25/13 0430 05/26/13 0457  NA 136 139 137  K  3.0* 3.3* 3.8  CL 97 98 96  CO2 30 30 28   GLUCOSE 179* 154* 208*  BUN 12 10 8   CREATININE 0.74 0.74 0.62  CALCIUM 9.4 9.8 9.5  MG  --  1.4*  --    Liver Function Tests:  Recent Labs Lab 05/24/13 0525  AST 10  ALT 5  ALKPHOS 81  BILITOT 0.5  PROT 6.6  ALBUMIN 2.6*   No results found for this basename: LIPASE, AMYLASE,  in the last 168 hours No results found for this basename: AMMONIA,  in the last 168 hours CBC:  Recent Labs Lab 05/24/13 0525 05/25/13 0430 05/26/13 0457  WBC 14.7* 11.2* 11.4*  HGB 10.4* 10.3* 11.1*  HCT 31.6* 32.0* 34.2*  MCV 87.5 87.4 88.1  PLT 189 186 191   Cardiac Enzymes: No results found for this basename: CKTOTAL, CKMB, CKMBINDEX, TROPONINI,  in the last 168 hours BNP (last 3 results)  Recent Labs  06/24/12 1600 08/12/12 0948 05/25/13 1040  PROBNP 1253.0* 301.2 1061.0*   CBG:  Recent Labs Lab 05/25/13 1158 05/25/13 1611 05/25/13 1937 05/25/13 2338 05/26/13 0446  GLUCAP 203* 171* 176* 202* 199*    Recent Results (from the past 240 hour(s))  MRSA PCR SCREENING     Status: Abnormal   Collection Time    05/23/13 11:50 PM      Result Value Range Status   MRSA by PCR POSITIVE (*) NEGATIVE Final   Comment:  The GeneXpert MRSA Assay (FDA     approved for NASAL specimens     only), is one component of a     comprehensive MRSA colonization     surveillance program. It is not     intended to diagnose MRSA     infection nor to guide or     monitor treatment for     MRSA infections.     RESULT CALLED TO, READ BACK BY AND VERIFIED WITH:     CALLED TO RN MELISSA HANCOCK [id number] @0605  [name]     Studies: Dg Chest Port 1 View  05/25/2013   CLINICAL DATA:  Dyspnea with history of CHF and hypertension.  EXAM: PORTABLE CHEST - 1 VIEW  COMPARISON:  Portable chest x-ray dated August 12, 2012.  FINDINGS: The right hemidiaphragm remains higher than the left. Both lungs are slightly less well inflated today. There is no  alveolar infiltrate but the interstitial markings are more prominent today than in the past. The cardiac silhouette is top-normal in size. There is prominence the right hilar structures which is stable. The pulmonary vascularity is also prominent centrally. A permanent pacemaker is not changed in appearance and position.  IMPRESSION: There is low grade CHF with mild interstitial edema which is accentuated by mild hypoinflation of both lungs and chronic elevation of the right hemidiaphragm. No focal pneumonia is demonstrated.   Electronically Signed   By: David  [place]   On: 05/25/2013 09:35   Dg Abd Portable 2v  05/25/2013   CLINICAL DATA:  Abdominal pain, history of diverticulitis, evaluate for free air  EXAM: PORTABLE ABDOMEN - 2 VIEW  COMPARISON:  Chest radiograph -05/25/2013  FINDINGS: Radiopaque material is seen throughout the colon. No definite evidence of obstruction. No pneumoperitoneum, pneumatosis or portal venous gas.  Limited visualization the lower thorax demonstrates pacer leads overlying the expected location of the right atrium and ventricle. A granuloma overlies the left lower lung. There is mild elevation of the right hemidiaphragm.  Extensive calcifications within the splenic artery and within the pelvic vasculature. Suspected granulomas within the spleen. Otherwise, no definitive abnormal intra-abdominal calcifications.  No acute osseus abnormalities. Mild-to-moderate multilevel thoracolumbar spine degenerative change with associated mild scoliotic curvature.  IMPRESSION: Nonobstructive bowel gas pattern. No definite evidence of pneumoperitoneum. Further evaluation may be obtained with abdominal CT as clinically indicated.   Electronically Signed   By: Simonne Come M.D.   On: 05/25/2013 12:01    Scheduled Meds: . azelastine  2 spray Each Nare BID  . calcitonin (salmon)  1 spray Alternating Nares Daily  . [START ON 05/28/2013] cyanocobalamin  1,000 mcg Intramuscular Q30 days  . ertapenem   1 g Intravenous Q24H  . insulin aspart  0-15 Units Subcutaneous Q4H  . latanoprost  1 drop Both Eyes QHS  . levothyroxine  25 mcg Intravenous Daily  . lidocaine  1 patch Transdermal Q24H  . metoprolol  5 mg Intravenous Q6H  . sodium chloride  3 mL Intravenous Q12H   Continuous Infusions: . dextrose 5 % and 0.9% NaCl 30 mL/hr at 05/25/13 2253  . diltiazem (CARDIZEM) infusion 15 mg/hr (05/26/13 0600)    Principal Problem:   Diverticulitis of colon (without mention of hemorrhage) Active Problems:   DIABETES MELLITUS   HYPERLIPIDEMIA   OBESITY   HYPERTENSION   Atrial fibrillation   Occlusion and stenosis of carotid artery without mention of cerebral infarction   Chronic diastolic CHF (congestive heart failure)   Compression fracture of L1 lumbar  vertebra   Cardiac pacemaker in situ   ILD (interstitial lung disease)   Diverticulitis   Atrial flutter    Time spent: 30 minutes.     Aneli Zara  [facility] Pager [phone]. If 7PM-7AM, please contact night-coverage at [email or web address], password [facility] 05/26/2013, 8:04 AM  LOS: 3 days

## 2013-05-26 NOTE — Progress Notes (Signed)
Subjective: No complaints. Feels much better today  Objective: Vital signs in last 24 hours: Temp:  [98.3 F (36.8 C)-100.2 F (37.9 C)] 98.8 F (37.1 C) (11/29 0800) Pulse Rate:  [113-124] 116 (11/29 1000) Resp:  [20-37] 23 (11/29 1000) BP: (111-156)/(51-80) 111/55 mmHg (11/29 1000) SpO2:  [86 %-97 %] 94 % (11/29 1000) Last BM Date:  (PTA)  Intake/Output from previous day: 11/28 0701 - 11/29 0700 In: 2090.8 [I.V.:1095; Blood:495.8; IV Piggyback:500] Out: 3200 [Urine:3200] Intake/Output this shift:    Resp: clear to auscultation bilaterally Cardio: irregularly irregular rhythm GI: soft, significantly less tender. now only has mild tenderness in RLQ. no guarding or peritonitis  Lab Results:   Recent Labs  05/25/13 0430 05/26/13 0457  WBC 11.2* 11.4*  HGB 10.3* 11.1*  HCT 32.0* 34.2*  PLT 186 191   BMET  Recent Labs  05/25/13 0430 05/26/13 0457  NA 139 137  K 3.3* 3.8  CL 98 96  CO2 30 28  GLUCOSE 154* 208*  BUN 10 8  CREATININE 0.74 0.62  CALCIUM 9.8 9.5   PT/INR  Recent Labs  05/25/13 1040 05/26/13 0457  LABPROT 27.6* 16.4*  INR 2.68* 1.36   ABG No results found for this basename: PHART, PCO2, PO2, HCO3,  in the last 72 hours  Studies/Results: Dg Chest Port 1 View  05/25/2013   CLINICAL DATA:  Dyspnea with history of CHF and hypertension.  EXAM: PORTABLE CHEST - 1 VIEW  COMPARISON:  Portable chest x-ray dated August 12, 2012.  FINDINGS: The right hemidiaphragm remains higher than the left. Both lungs are slightly less well inflated today. There is no alveolar infiltrate but the interstitial markings are more prominent today than in the past. The cardiac silhouette is top-normal in size. There is prominence the right hilar structures which is stable. The pulmonary vascularity is also prominent centrally. A permanent pacemaker is not changed in appearance and position.  IMPRESSION: There is low grade CHF with mild interstitial edema which is  accentuated by mild hypoinflation of both lungs and chronic elevation of the right hemidiaphragm. No focal pneumonia is demonstrated.   Electronically Signed   By: David  [place]   On: 05/25/2013 09:35   Dg Abd Portable 2v  05/25/2013   CLINICAL DATA:  Abdominal pain, history of diverticulitis, evaluate for free air  EXAM: PORTABLE ABDOMEN - 2 VIEW  COMPARISON:  Chest radiograph -05/25/2013  FINDINGS: Radiopaque material is seen throughout the colon. No definite evidence of obstruction. No pneumoperitoneum, pneumatosis or portal venous gas.  Limited visualization the lower thorax demonstrates pacer leads overlying the expected location of the right atrium and ventricle. A granuloma overlies the left lower lung. There is mild elevation of the right hemidiaphragm.  Extensive calcifications within the splenic artery and within the pelvic vasculature. Suspected granulomas within the spleen. Otherwise, no definitive abnormal intra-abdominal calcifications.  No acute osseus abnormalities. Mild-to-moderate multilevel thoracolumbar spine degenerative change with associated mild scoliotic curvature.  IMPRESSION: Nonobstructive bowel gas pattern. No definite evidence of pneumoperitoneum. Further evaluation may be obtained with abdominal CT as clinically indicated.   Electronically Signed   By: Simonne Come M.D.   On: 05/25/2013 12:01    Anti-infectives: Anti-infectives   Start     Dose/Rate Route Frequency Ordered Stop   05/25/13 1130  ertapenem (INVANZ) 1 g in sodium chloride 0.9 % 50 mL IVPB     1 g 100 mL/hr over 30 Minutes Intravenous Every 24 hours 05/25/13 1126     05/23/13  2200  metroNIDAZOLE (FLAGYL) IVPB 500 mg  Status:  Discontinued     500 mg 100 mL/hr over 60 Minutes Intravenous Every 8 hours 05/23/13 2140 05/25/13 1126   05/23/13 2145  ciprofloxacin (CIPRO) IVPB 400 mg  Status:  Discontinued     400 mg 200 mL/hr over 60 Minutes Intravenous Every 12 hours 05/23/13 2140 05/25/13 1126       Assessment/Plan: s/p * No surgery found * continue Invanz Continue bowel rest Afib. Medicine to work on rate control  LOS: 3 days    TOTH III,PAUL S 05/26/2013

## 2013-05-27 DIAGNOSIS — K5732 Diverticulitis of large intestine without perforation or abscess without bleeding: Secondary | ICD-10-CM

## 2013-05-27 DIAGNOSIS — I10 Essential (primary) hypertension: Secondary | ICD-10-CM

## 2013-05-27 DIAGNOSIS — E119 Type 2 diabetes mellitus without complications: Secondary | ICD-10-CM

## 2013-05-27 LAB — CBC
MCH: 28.7 pg (ref 26.0–34.0)
MCHC: 32.7 g/dL (ref 30.0–36.0)
MCV: 87.8 fL (ref 78.0–100.0)
Platelets: 221 10*3/uL (ref 150–400)
RBC: 3.62 MIL/uL — ABNORMAL LOW (ref 3.87–5.11)
RDW: 13.5 % (ref 11.5–15.5)
WBC: 15.1 10*3/uL — ABNORMAL HIGH (ref 4.0–10.5)

## 2013-05-27 LAB — GLUCOSE, CAPILLARY
Glucose-Capillary: 205 mg/dL — ABNORMAL HIGH (ref 70–99)
Glucose-Capillary: 369 mg/dL — ABNORMAL HIGH (ref 70–99)
Glucose-Capillary: 334 mg/dL — ABNORMAL HIGH (ref 70–99)
Glucose-Capillary: 217 mg/dL — ABNORMAL HIGH (ref 70–99)

## 2013-05-27 LAB — BASIC METABOLIC PANEL
BUN: 11 mg/dL (ref 6–23)
CO2: 31 mEq/L (ref 19–32)
Calcium: 10.3 mg/dL (ref 8.4–10.5)
Chloride: 97 mEq/L (ref 96–112)
Creatinine, Ser: 0.59 mg/dL (ref 0.50–1.10)
GFR calc Af Amer: >90 mL/min (ref >90)
GFR calc non Af Amer: 84 mL/min — ABNORMAL LOW (ref >90)
Sodium: 140 mEq/L (ref 135–145)

## 2013-05-27 LAB — PROTIME-INR: Prothrombin Time: 15.6 seconds — ABNORMAL HIGH (ref 11.6–15.2)

## 2013-05-27 LAB — MAGNESIUM: Magnesium: 1.6 mg/dL (ref 1.5–2.5)

## 2013-05-27 MED ORDER — BIOTENE DRY MOUTH MT LIQD
15.0000 mL | Freq: Two times a day (BID) | OROMUCOSAL | Status: DC
  Administered 2013-05-27 – 2013-06-06 (×16): 15 mL via OROMUCOSAL

## 2013-05-27 MED ORDER — LORAZEPAM 0.5 MG PO TABS
0.5000 mg | ORAL_TABLET | Freq: Three times a day (TID) | ORAL | Status: DC | PRN
  Administered 2013-05-27 – 2013-05-29 (×3): 1 mg via ORAL
  Filled 2013-05-27 (×2): qty 2
  Filled 2013-05-27: qty 1
  Filled 2013-05-27: qty 2

## 2013-05-27 MED ORDER — CHLORHEXIDINE GLUCONATE 0.12 % MT SOLN
15.0000 mL | Freq: Two times a day (BID) | OROMUCOSAL | Status: DC
  Administered 2013-05-27 – 2013-06-06 (×21): 15 mL via OROMUCOSAL
  Filled 2013-05-27 (×23): qty 15

## 2013-05-27 NOTE — Progress Notes (Signed)
    Subjective:  CP with inspiration; no dyspnea; abdominal pain improving   Objective:  Filed Vitals:   05/27/13 0200 05/27/13 0400 05/27/13 0600 05/27/13 0800  BP: 138/54 129/56 142/56 127/63  Pulse: 101 105 105 104  Temp:  98.3 F (36.8 C)  98.5 F (36.9 C)  TempSrc:  Oral  Oral  Resp: 24 27 25 24   Height:      Weight:      SpO2: 91% 93% 94% 94%    Intake/Output from previous day:  Intake/Output Summary (Last 24 hours) at 05/27/13 1023 Last data filed at 05/27/13 0800  Gross per 24 hour  Intake  355.5 ml  Output    775 ml  Net -419.5 ml    Physical Exam: Physical exam: Well-developed well-nourished in no acute distress.  Skin is warm and dry.  HEENT is normal.  Neck is supple.  Chest with basilar crackles Cardiovascular exam is tachycardic  Abdominal exam mild tenderness to palpation Extremities show trace edema. neuro grossly intact    Lab Results: Basic Metabolic Panel:  Recent Labs  98/11/91 0430 05/26/13 0457 05/27/13 0415  NA 139 137 140  K 3.3* 3.8 3.6  CL 98 96 97  CO2 30 28 31   GLUCOSE 154* 208* 215*  BUN 10 8 11   CREATININE 0.74 0.62 0.59  CALCIUM 9.8 9.5 10.3  MG 1.4*  --  1.6   CBC:  Recent Labs  05/26/13 0457 05/27/13 0415  WBC 11.4* 15.1*  HGB 11.1* 10.4*  HCT 34.2* 31.8*  MCV 88.1 87.8  PLT 191 221     Assessment/Plan:  1 paroxysmal atrial fibrillation/flutter-the patient has converted to sinus. She remains n.p.o. given perforated diverticulitis. Continue IV metoprolol and IV Cardizem. Coumadin is on hold as she may require surgical intervention for perforated diverticulitis. Once her diverticulitis is treated and improves would resume Coumadin. We can consider resuming sotalol later; apparently not taking on admission.  2 chronic diastolic congestive heart failure-the patient appears to be euvolemic on examination. She does have crackles but this may be related to her interstitial lung disease. Diuretics on hold. Would  be careful with hydration given history of diastolic congestive heart failure. Would keep I and O approximately equal. 3 perforated diverticulitis-management per general surgery. Continue antibiotics.  4 history of CAD  5 diabetes mellitus-follow CBG.   Olga Millers 05/27/2013, 10:23 AM

## 2013-05-27 NOTE — Progress Notes (Signed)
Subjective: Denies abdominal pain  Objective: Vital signs in last 24 hours: Temp:  [98 F (36.7 C)-98.7 F (37.1 C)] 98.5 F (36.9 C) (11/30 0800) Pulse Rate:  [80-105] 104 (11/30 0800) Resp:  [21-43] 24 (11/30 0800) BP: (83-142)/(50-107) 127/63 mmHg (11/30 0800) SpO2:  [91 %-99 %] 94 % (11/30 0800) Last BM Date:  (PTA)  Intake/Output from previous day: 11/29 0701 - 11/30 0700 In: 358.5 [I.V.:358.5] Out: 650 [Urine:650] Intake/Output this shift: Total I/O In: -  Out: 125 [Urine:125]  General appearance: alert, cooperative and no distress GI: soft, nontender to my exam in both RLQ and LLQ, ND, no peritoneal signs  Lab Results:   Recent Labs  05/26/13 0457 05/27/13 0415  WBC 11.4* 15.1*  HGB 11.1* 10.4*  HCT 34.2* 31.8*  PLT 191 221   BMET  Recent Labs  05/26/13 0457 05/27/13 0415  NA 137 140  K 3.8 3.6  CL 96 97  CO2 28 31  GLUCOSE 208* 215*  BUN 8 11  CREATININE 0.62 0.59  CALCIUM 9.5 10.3   PT/INR  Recent Labs  05/26/13 0457 05/27/13 0415  LABPROT 16.4* 15.6*  INR 1.36 1.27   ABG No results found for this basename: PHART, PCO2, PO2, HCO3,  in the last 72 hours  Studies/Results: Dg Abd Portable 2v  05/25/2013   CLINICAL DATA:  Abdominal pain, history of diverticulitis, evaluate for free air  EXAM: PORTABLE ABDOMEN - 2 VIEW  COMPARISON:  Chest radiograph -05/25/2013  FINDINGS: Radiopaque material is seen throughout the colon. No definite evidence of obstruction. No pneumoperitoneum, pneumatosis or portal venous gas.  Limited visualization the lower thorax demonstrates pacer leads overlying the expected location of the right atrium and ventricle. A granuloma overlies the left lower lung. There is mild elevation of the right hemidiaphragm.  Extensive calcifications within the splenic artery and within the pelvic vasculature. Suspected granulomas within the spleen. Otherwise, no definitive abnormal intra-abdominal calcifications.  No acute osseus  abnormalities. Mild-to-moderate multilevel thoracolumbar spine degenerative change with associated mild scoliotic curvature.  IMPRESSION: Nonobstructive bowel gas pattern. No definite evidence of pneumoperitoneum. Further evaluation may be obtained with abdominal CT as clinically indicated.   Electronically Signed   By: Simonne Come M.D.   On: 05/25/2013 12:01    Anti-infectives: Anti-infectives   Start     Dose/Rate Route Frequency Ordered Stop   05/25/13 1130  ertapenem (INVANZ) 1 g in sodium chloride 0.9 % 50 mL IVPB     1 g 100 mL/hr over 30 Minutes Intravenous Every 24 hours 05/25/13 1126     05/23/13 2200  metroNIDAZOLE (FLAGYL) IVPB 500 mg  Status:  Discontinued     500 mg 100 mL/hr over 60 Minutes Intravenous Every 8 hours 05/23/13 2140 05/25/13 1126   05/23/13 2145  ciprofloxacin (CIPRO) IVPB 400 mg  Status:  Discontinued     400 mg 200 mL/hr over 60 Minutes Intravenous Every 12 hours 05/23/13 2140 05/25/13 1126      Assessment/Plan: s/p * No surgery found * She doesnt appear to have any significant abdominal tenderness to my exam.  She has not received any pain meds this morning and she denies pain.  Remains AF.  Her wbc is elevated.  It is hard to assess her, but since she denies pain and her exam appears benign today, I would favor continued medical management.  I have discussed her case with Dr. Sunnie Nielsen and if she continues to improve, then we will consider advancing diet tomorrow and attempting  to convert her to oral meds as tolerated.   LOS: 4 days    Lodema Pilot DAVID 05/27/2013

## 2013-05-27 NOTE — Progress Notes (Signed)
Patient ID: Alexandria [name], female   DOB: 10/25/1929, 79 y.o.   MRN: [id number] Wbc a little higher but her exam is much improved, will let her have clears, recheck wbc in am, if this is still high or increasing she may need repeat ct.  Hopefully abx switch will avoid surgery for her.

## 2013-05-27 NOTE — Progress Notes (Signed)
[facility] PROGRESS NOTE  Alexandria [name] WUJ:[id number] DOB: 10-26-1929 DOA: 05/23/2013 PCP: [name], MD  Assessment/Plan: 1-Perforated Diverticulitis: Continue with INVANZ day 3.  Patient received ciprofloxacin  and flagyl for one day. NPO status. Surgery following. WBC increase 15. Patient is not tender on abdominal exam evaluation. Repeat CBC in am.   2-Hypertension: Hold Norvasc, Imdur.   3-History of A fib: Continue with IV metoprolol, Cardizem Gtt. Holding coumadin in case patient required surgery intervention. INR corrected with FFP 11-28  and vitamin K. INR at 1.2.   4-Chronic diastolic Heart Failure; Cardiology following. Monitor for pulmonary edema.   5-Diabetes: SSI. Hold long acting insulin due to NPO status.   6-ILD: stable.   7-Hypokalemia: resolved. Repeat labs in am.  8-Anxiety/ delirium: ativan PRN.   Code Status: Full Code.  Family Communication: Care discussed with patient.  Disposition Plan: Remain in the step down unit.    Consultants:  Surgery  Procedures:  none  Antibiotics:  Ciprofloxacin 11-26---11-27  Flagyl 11-26----11-27  Invanz 11-27  HPI/Subjective: She can not tell me how is the abdominal pain.  She wants to drink water.  She is anxious, appears agitated.   Objective: Filed Vitals:   05/27/13 0400  BP: 129/56  Pulse: 105  Temp: 98.3 F (36.8 C)  Resp: 27    Intake/Output Summary (Last 24 hours) at 05/27/13 0845 Last data filed at 05/27/13 0400  Gross per 24 hour  Intake  358.5 ml  Output    650 ml  Net -291.5 ml   Filed Weights   05/23/13 1915 05/24/13 0400  Weight: 81.5 kg (179 lb 10.8 oz) 82.4 kg (181 lb 10.5 oz)    Exam:   General:  Anxious, mild agitated.   Cardiovascular: S 1, S 2 RRR   Respiratory: Decrease breath sound, crackles bases.   Abdomen: Bs present, soft, no tenderness.   Musculoskeletal: no edema.   Data Reviewed: Basic Metabolic Panel:  Recent Labs Lab 05/24/13 0525  05/25/13 0430 05/26/13 0457 05/27/13 0415  NA 136 139 137 140  K 3.0* 3.3* 3.8 3.6  CL 97 98 96 97  CO2 30 30 28 31   GLUCOSE 179* 154* 208* 215*  BUN 12 10 8 11   CREATININE 0.74 0.74 0.62 0.59  CALCIUM 9.4 9.8 9.5 10.3  MG  --  1.4*  --  1.6   Liver Function Tests:  Recent Labs Lab 05/24/13 0525  AST 10  ALT 5  ALKPHOS 81  BILITOT 0.5  PROT 6.6  ALBUMIN 2.6*   No results found for this basename: LIPASE, AMYLASE,  in the last 168 hours No results found for this basename: AMMONIA,  in the last 168 hours CBC:  Recent Labs Lab 05/24/13 0525 05/25/13 0430 05/26/13 0457 05/27/13 0415  WBC 14.7* 11.2* 11.4* 15.1*  HGB 10.4* 10.3* 11.1* 10.4*  HCT 31.6* 32.0* 34.2* 31.8*  MCV 87.5 87.4 88.1 87.8  PLT 189 186 191 221   Cardiac Enzymes: No results found for this basename: CKTOTAL, CKMB, CKMBINDEX, TROPONINI,  in the last 168 hours BNP (last 3 results)  Recent Labs  06/24/12 1600 08/12/12 0948 05/25/13 1040  PROBNP 1253.0* 301.2 1061.0*   CBG:  Recent Labs Lab 05/26/13 1642 05/26/13 1959 05/27/13 0019 05/27/13 0444 05/27/13 0819  GLUCAP 219* 230* 212* 231* 205*    Recent Results (from the past 240 hour(s))  MRSA PCR SCREENING     Status: Abnormal   Collection Time    05/23/13 11:50 PM  Result Value Range Status   MRSA by PCR POSITIVE (*) NEGATIVE Final   Comment:            The GeneXpert MRSA Assay (FDA     approved for NASAL specimens     only), is one component of a     comprehensive MRSA colonization     surveillance program. It is not     intended to diagnose MRSA     infection nor to guide or     monitor treatment for     MRSA infections.     RESULT CALLED TO, READ BACK BY AND VERIFIED WITH:     CALLED TO RN MELISSA HANCOCK [id number] @0605  [name]     Studies: Dg Chest Port 1 View  05/25/2013   CLINICAL DATA:  Dyspnea with history of CHF and hypertension.  EXAM: PORTABLE CHEST - 1 VIEW  COMPARISON:  Portable chest x-ray dated August 12, 2012.  FINDINGS: The right hemidiaphragm remains higher than the left. Both lungs are slightly less well inflated today. There is no alveolar infiltrate but the interstitial markings are more prominent today than in the past. The cardiac silhouette is top-normal in size. There is prominence the right hilar structures which is stable. The pulmonary vascularity is also prominent centrally. A permanent pacemaker is not changed in appearance and position.  IMPRESSION: There is low grade CHF with mild interstitial edema which is accentuated by mild hypoinflation of both lungs and chronic elevation of the right hemidiaphragm. No focal pneumonia is demonstrated.   Electronically Signed   By: David  [place]   On: 05/25/2013 09:35   Dg Abd Portable 2v  05/25/2013   CLINICAL DATA:  Abdominal pain, history of diverticulitis, evaluate for free air  EXAM: PORTABLE ABDOMEN - 2 VIEW  COMPARISON:  Chest radiograph -05/25/2013  FINDINGS: Radiopaque material is seen throughout the colon. No definite evidence of obstruction. No pneumoperitoneum, pneumatosis or portal venous gas.  Limited visualization the lower thorax demonstrates pacer leads overlying the expected location of the right atrium and ventricle. A granuloma overlies the left lower lung. There is mild elevation of the right hemidiaphragm.  Extensive calcifications within the splenic artery and within the pelvic vasculature. Suspected granulomas within the spleen. Otherwise, no definitive abnormal intra-abdominal calcifications.  No acute osseus abnormalities. Mild-to-moderate multilevel thoracolumbar spine degenerative change with associated mild scoliotic curvature.  IMPRESSION: Nonobstructive bowel gas pattern. No definite evidence of pneumoperitoneum. Further evaluation may be obtained with abdominal CT as clinically indicated.   Electronically Signed   By: Simonne Come M.D.   On: 05/25/2013 12:01    Scheduled Meds: . azelastine  2 spray Each Nare BID  .  calcitonin (salmon)  1 spray Alternating Nares Daily  . Chlorhexidine Gluconate Cloth  6 each Topical Q0600  . [START ON 05/28/2013] cyanocobalamin  1,000 mcg Intramuscular Q30 days  . ertapenem  1 g Intravenous Q24H  . insulin aspart  0-15 Units Subcutaneous Q4H  . latanoprost  1 drop Both Eyes QHS  . levothyroxine  25 mcg Intravenous Daily  . lidocaine  1 patch Transdermal Q24H  . metoprolol  5 mg Intravenous Q4H  . mupirocin ointment   Nasal BID  . sodium chloride  3 mL Intravenous Q12H   Continuous Infusions: . dextrose 5 % and 0.9% NaCl 30 mL/hr at 05/27/13 0600  . diltiazem (CARDIZEM) infusion 10 mg/hr (05/27/13 0600)    Principal Problem:   Diverticulitis of colon (without mention of hemorrhage) Active Problems:  DIABETES MELLITUS   HYPERLIPIDEMIA   OBESITY   HYPERTENSION   Atrial fibrillation   Occlusion and stenosis of carotid artery without mention of cerebral infarction   Chronic diastolic CHF (congestive heart failure)   Compression fracture of L1 lumbar vertebra   Cardiac pacemaker in situ   ILD (interstitial lung disease)   Diverticulitis   Atrial flutter    Time spent: 30 minutes.     Lafawn Lenoir  [facility] Pager [phone]. If 7PM-7AM, please contact night-coverage at [email or web address], password [facility] 05/27/2013, 8:45 AM  LOS: 4 days

## 2013-05-28 DIAGNOSIS — I5043 Acute on chronic combined systolic (congestive) and diastolic (congestive) heart failure: Secondary | ICD-10-CM

## 2013-05-28 LAB — GLUCOSE, CAPILLARY
Glucose-Capillary: 119 mg/dL — ABNORMAL HIGH (ref 70–99)
Glucose-Capillary: 200 mg/dL — ABNORMAL HIGH (ref 70–99)
Glucose-Capillary: 350 mg/dL — ABNORMAL HIGH (ref 70–99)
Glucose-Capillary: 318 mg/dL — ABNORMAL HIGH (ref 70–99)
Glucose-Capillary: 299 mg/dL — ABNORMAL HIGH (ref 70–99)

## 2013-05-28 LAB — BASIC METABOLIC PANEL
CO2: 30 mEq/L (ref 19–32)
Creatinine, Ser: 0.57 mg/dL (ref 0.50–1.10)
GFR calc non Af Amer: 85 mL/min — ABNORMAL LOW (ref >90)
Glucose, Bld: 329 mg/dL — ABNORMAL HIGH (ref 70–99)
Potassium: 3.6 mEq/L (ref 3.5–5.1)
Sodium: 136 mEq/L (ref 135–145)

## 2013-05-28 LAB — CBC
Hemoglobin: 10.4 g/dL — ABNORMAL LOW (ref 12.0–15.0)
MCH: 28.9 pg (ref 26.0–34.0)
MCV: 88.1 fL (ref 78.0–100.0)
Platelets: 237 10*3/uL (ref 150–400)
RDW: 13.5 % (ref 11.5–15.5)
WBC: 9.9 10*3/uL (ref 4.0–10.5)

## 2013-05-28 LAB — PROTIME-INR
INR: 1.26 (ref 0.00–1.49)
Prothrombin Time: 15.5 seconds — ABNORMAL HIGH (ref 11.6–15.2)

## 2013-05-28 MED ORDER — FUROSEMIDE 10 MG/ML IJ SOLN
40.0000 mg | Freq: Two times a day (BID) | INTRAMUSCULAR | Status: DC
  Administered 2013-05-28 – 2013-05-29 (×3): 40 mg via INTRAVENOUS
  Filled 2013-05-28 (×3): qty 4

## 2013-05-28 NOTE — Progress Notes (Signed)
Patient Name: Alexandria [name] Date of Encounter: 05/28/2013  Principal Problem:   Diverticulitis of colon (without mention of hemorrhage) Active Problems:   Atrial fibrillation   Acute on chronic diastolic heart failure   Atrial flutter   DIABETES MELLITUS   OBESITY   HYPERTENSION   Compression fracture of L1 lumbar vertebra   ILD (interstitial lung disease)   HYPERLIPIDEMIA   Occlusion and stenosis of carotid artery without mention of cerebral infarction   Cardiac pacemaker in situ   SUBJECTIVE  Went back into afib early this AM. Asymptomatic w/o chest pain, sob, palpitations.  Denies abd discomfort/tenderness.  Slept well.  CURRENT MEDS . antiseptic oral rinse  15 mL Mouth Rinse q12n4p  . azelastine  2 spray Each Nare BID  . calcitonin (salmon)  1 spray Alternating Nares Daily  . chlorhexidine  15 mL Mouth Rinse BID  . Chlorhexidine Gluconate Cloth  6 each Topical Q0600  . cyanocobalamin  1,000 mcg Intramuscular Q30 days  . ertapenem  1 g Intravenous Q24H  . insulin aspart  0-15 Units Subcutaneous Q4H  . latanoprost  1 drop Both Eyes QHS  . levothyroxine  25 mcg Intravenous Daily  . lidocaine  1 patch Transdermal Q24H  . metoprolol  5 mg Intravenous Q4H  . mupirocin ointment   Nasal BID  . sodium chloride  3 mL Intravenous Q12H    OBJECTIVE  Filed Vitals:   05/28/13 0325 05/28/13 0400 05/28/13 0600 05/28/13 0750  BP: 130/82 149/72 144/55   Pulse: 135 119 130   Temp: 98.2 F (36.8 C)   97.5 F (36.4 C)  TempSrc: Oral   Oral  Resp: 30 28 27    Height:      Weight:      SpO2: 97% 96% 96%     Intake/Output Summary (Last 24 hours) at 05/28/13 0903 Last data filed at 05/28/13 0750  Gross per 24 hour  Intake    720 ml  Output    800 ml  Net    -80 ml   Filed Weights   05/23/13 1915 05/24/13 0400  Weight: 179 lb 10.8 oz (81.5 kg) 181 lb 10.5 oz (82.4 kg)   *Weight today - 86.3 kg*  PHYSICAL EXAM  General: Pleasant, NAD. Neuro: Alert and oriented X 3.  Moves all extremities spontaneously. Psych: Normal affect. HEENT:  Normal  Neck: Supple without bruits.  JVD to jaw. Lungs:  Resp regular and unlabored, crackles 1/2 up bilaterally.Marland Kitchen Heart: IR IR, tachy,  no s3, s4, or murmurs. Abdomen: Soft, non-tender, non-distended, BS + x 4.  Extremities: No clubbing, cyanosis or edema. DP/PT/Radials 2+ and equal bilaterally.  Accessory Clinical Findings  CBC  Recent Labs  05/27/13 0415 05/28/13 0425  WBC 15.1* 9.9  HGB 10.4* 10.4*  HCT 31.8* 31.7*  MCV 87.8 88.1  PLT 221 237   Basic Metabolic Panel  Recent Labs  05/26/13 0457 05/27/13 0415  NA 137 140  K 3.8 3.6  CL 96 97  CO2 28 31  GLUCOSE 208* 215*  BUN 8 11  CREATININE 0.62 0.59  CALCIUM 9.5 10.3  MG  --  1.6   TELE  afib 110 to 120's since ~1:10 AM this morning.  Radiology/Studies  Dg Chest Port 1 View  05/25/2013   CLINICAL DATA:  Dyspnea with history of CHF and hypertension.  EXAM: PORTABLE CHEST - 1 VIEW   IMPRESSION: There is low grade CHF with mild interstitial edema which is accentuated by mild hypoinflation  of both lungs and chronic elevation of the right hemidiaphragm. No focal pneumonia is demonstrated.   Electronically Signed   By: Alexandria  [name]   On: 05/25/2013 09:35   Dg Abd Portable 2v  05/25/2013   CLINICAL DATA:  Abdominal pain, history of diverticulitis, evaluate for free air  EXAM: PORTABLE ABDOMEN - 2 VIEW   IMPRESSION: Nonobstructive bowel gas pattern. No definite evidence of pneumoperitoneum. Further evaluation may be obtained with abdominal CT as clinically indicated.   Electronically Signed   By: Alexandria [name] M.D.   On: 05/25/2013 12:01   ASSESSMENT AND PLAN  1.  Perforated diverticulitis.  Mgmt per GSU->abx for the time being.  WBC down this AM.  Feels well.  2.  PAF:  Back in afib since ~ 1:10 AM.  Asymptomatic though she does have evidence of volume overload on exam.  She is not yet taking PO's.  Cont dilt gtt and iv metoprolol.  Suspect #1 is  driving afib.  Upon review of her home meds, sotalol is not on the list from pharmacy, however she was on it in August according to office note, and she says that she was still taking it.  Will plan to add back once she is reliably taking PO's.  Coumadin on hold in setting of #1 with possible need for surgery.  3. Acute on chronic diastolic chf:  Pt with evidence of volume overload on exam, likely driven by af rvr.  Weight is up 4 KG since admission.  Cont attempts @ rate control.  Diurese today.  4.  HTN:  bp's sl elevated.  Cont IV dilt/bb.  5.  DM: per IM.  Signed, Alexandria Ducking NP   History and all data above reviewed.  Patient examined.  I agree with the findings as above.  She is frail appearing and confused.  It is difficult to assess volume.  However, her weight is up.  Neck veins are up.  The patient exam reveals WUJ:[id number]  ,  Lungs: Dry crackles (chronic)  ,  Abd: Positive bowel sounds, no rebound no guarding, Ext No edema  .  All available labs, radiology testing, previous records reviewed. Agree with documented assessment and plan. Atrial fib.  I will restart Sotalol when taking pills PO.  For now rate control.  Holding warfarin for now.  She is much more frail than I have ever seen before.  We will need to be careful as we consider whether or not we send her home with anticoagulation when she is discharged.  I agree with IV Lasix and I will reassess tomorrow.    Alexandria [name]  9:12 AM  05/28/2013

## 2013-05-28 NOTE — Progress Notes (Signed)
Inpatient Diabetes Program Recommendations  AACE/ADA: New Consensus Statement on Inpatient Glycemic Control (2013)  Target Ranges:  Prepandial:   less than 140 mg/dL      Peak postprandial:   less than 180 mg/dL (1-2 hours)      Critically ill patients:  140 - 180 mg/dL     Results for LYLIAN, SANAGUSTIN (MRN [id number]) as of 05/28/2013 14:30  Ref. Range 05/27/2013 23:06 05/28/2013 03:24 05/28/2013 07:44 05/28/2013 12:02  Glucose-Capillary Latest Range: 70-99 mg/dL 086 (H) 578 (H) 469 (H) 350 (H)     **Patient now on FL diet.  Having hyperglycemia with CBGs >200 mg/dl consistently.   **MD- Please consider restarting patient's home dose of Lantus- Lantus 8 units QHS    Will follow. Ambrose Finland RN, MSN, CDE Diabetes Coordinator Inpatient Diabetes Program Team Pager: [phone] (8a-10p)

## 2013-05-28 NOTE — Progress Notes (Signed)
Utilization review completed.  

## 2013-05-28 NOTE — Progress Notes (Signed)
Came to insert PICC however pt is very adament "I don't want this done"   "I told them I don't want this done"   "Why does everyone think I have to have this?"  Reassured her she did not have to have this done if she does not want it.  Primary RN made aware.

## 2013-05-28 NOTE — Progress Notes (Signed)
Diverticulitis of colon (without mention of hemorrhage)  Assessment: Acute diverticulitis, improving on antibiotic, wbc normalizing  Plan: Can slowly advance diet. Might be good to repeat CT mid week   Subjective: Feels better, no pain, wants more to eat.  Objective: Vital signs in last 24 hours: Temp:  [97.5 F (36.4 C)-98.3 F (36.8 C)] 97.5 F (36.4 C) (12/01 0750) Pulse Rate:  [72-135] 130 (12/01 0600) Resp:  [22-30] 27 (12/01 0600) BP: (130-164)/(53-114) 144/55 mmHg (12/01 0600) SpO2:  [89 %-99 %] 96 % (12/01 0600) Last BM Date:  (PTA)  Intake/Output from previous day: 11/30 0701 - 12/01 0700 In: 720 [I.V.:720] Out: 750 [Urine:750]  General appearance: alert, cooperative and no distress Resp: clear to auscultation bilaterally Cardio: regular rate and rhythm, S1, S2 normal, no murmur, click, rub or gallop GI: soft, non-tender; bowel sounds normal; no masses,  no organomegaly  Lab Results:  Results for orders placed during the hospital encounter of 05/23/13 (from the past 24 hour(s))  GLUCOSE, CAPILLARY     Status: Abnormal   Collection Time    05/27/13 12:36 PM      Result Value Range   Glucose-Capillary 243 (*) 70 - 99 mg/dL   Comment 1 Notify RN     Comment 2 Call MD NNP PA CNM    GLUCOSE, CAPILLARY     Status: Abnormal   Collection Time    05/27/13  4:36 PM      Result Value Range   Glucose-Capillary 369 (*) 70 - 99 mg/dL  GLUCOSE, CAPILLARY     Status: Abnormal   Collection Time    05/27/13  8:00 PM      Result Value Range   Glucose-Capillary 334 (*) 70 - 99 mg/dL   Comment 1 Notify RN    GLUCOSE, CAPILLARY     Status: Abnormal   Collection Time    05/27/13 11:06 PM      Result Value Range   Glucose-Capillary 217 (*) 70 - 99 mg/dL   Comment 1 Notify RN    GLUCOSE, CAPILLARY     Status: Abnormal   Collection Time    05/28/13  3:24 AM      Result Value Range   Glucose-Capillary 119 (*) 70 - 99 mg/dL   Comment 1 Notify RN    PROTIME-INR      Status: Abnormal   Collection Time    05/28/13  4:25 AM      Result Value Range   Prothrombin Time 15.5 (*) 11.6 - 15.2 seconds   INR 1.26  0.00 - 1.49  CBC     Status: Abnormal   Collection Time    05/28/13  4:25 AM      Result Value Range   WBC 9.9  4.0 - 10.5 K/uL   RBC 3.60 (*) 3.87 - 5.11 MIL/uL   Hemoglobin 10.4 (*) 12.0 - 15.0 g/dL   HCT 04.5 (*) 40.9 - 81.1 %   MCV 88.1  78.0 - 100.0 fL   MCH 28.9  26.0 - 34.0 pg   MCHC 32.8  30.0 - 36.0 g/dL   RDW 91.4  78.2 - 95.6 %   Platelets 237  150 - 400 K/uL  GLUCOSE, CAPILLARY     Status: Abnormal   Collection Time    05/28/13  7:44 AM      Result Value Range   Glucose-Capillary 200 (*) 70 - 99 mg/dL     Studies/Results Radiology     MEDS, Scheduled .  antiseptic oral rinse  15 mL Mouth Rinse q12n4p  . azelastine  2 spray Each Nare BID  . calcitonin (salmon)  1 spray Alternating Nares Daily  . chlorhexidine  15 mL Mouth Rinse BID  . Chlorhexidine Gluconate Cloth  6 each Topical Q0600  . cyanocobalamin  1,000 mcg Intramuscular Q30 days  . ertapenem  1 g Intravenous Q24H  . furosemide  40 mg Intravenous BID  . insulin aspart  0-15 Units Subcutaneous Q4H  . latanoprost  1 drop Both Eyes QHS  . levothyroxine  25 mcg Intravenous Daily  . lidocaine  1 patch Transdermal Q24H  . metoprolol  5 mg Intravenous Q4H  . mupirocin ointment   Nasal BID  . sodium chloride  3 mL Intravenous Q12H       LOS: 5 days    Currie Paris, MD, [facility], [place] [phone]   05/28/2013 9:12 AM

## 2013-05-28 NOTE — Progress Notes (Signed)
Pt converted to afib at 0110. Pt on cardizem gtt. Heart rate in 120's to 130's. Rate changed to 15 on gtt. MD notified. No new orders given. Pt does not c/o any chest pain, SOB or any other discomfort. Will continue to monitor.

## 2013-05-28 NOTE — Progress Notes (Signed)
[facility] PROGRESS NOTE  Alexandria [name] BJY:[id number] DOB: 10-04-1929 DOA: 05/23/2013 PCP: [name], MD  Assessment/Plan: 1-Perforated Diverticulitis: Continue with INVANZ day 4.  Patient received ciprofloxacin  and flagyl for one day. NPO status. Surgery following. WBC decrease to 9. Appears to be improving clinically. Tolerating clear diet.   2-Hypertension: Hold Norvasc, Imdur.   3-History of A fib: Continue with IV metoprolol, Cardizem Gtt. Holding coumadin in case patient required surgery intervention. INR corrected with FFP 11-28  and vitamin K. INR at 1.2.  -will defer to cardio transition to oral medications. HR this am at 130. Patient asymptomatic.   4-Chronic diastolic Heart Failure; Cardiology following. Monitor for pulmonary edema. Will NSL IV fluids, patient tolerating clear diet.   5-Diabetes: SSI. Hold long acting insulin due to NPO status.   6-ILD: stable.   7-Hypokalemia: labs pending for this morning.  8-Anxiety/ delirium: ativan PRN. Improved.   Code Status: Full Code.  Family Communication: Care discussed with patient.  Disposition Plan: Remain in the step down unit.    Consultants:  Surgery  Procedures:  none  Antibiotics:  Ciprofloxacin 11-26---11-27  Flagyl 11-26----11-27  Invanz 11-27  HPI/Subjective: She is feeling better. Denies dyspnea. Denies abdominal pain. She has been tolerating clear diet.   Objective: Filed Vitals:   05/28/13 0750  BP:   Pulse:   Temp: 97.5 F (36.4 C)  Resp:     Intake/Output Summary (Last 24 hours) at 05/28/13 0844 Last data filed at 05/28/13 0750  Gross per 24 hour  Intake    720 ml  Output    800 ml  Net    -80 ml   Filed Weights   05/23/13 1915 05/24/13 0400  Weight: 81.5 kg (179 lb 10.8 oz) 82.4 kg (181 lb 10.5 oz)    Exam:   General: in no distress. Calm.   Cardiovascular: S 1, S 2 RRR   Respiratory: bilateral crackles.   Abdomen: Bs present, soft, no tenderness.    Musculoskeletal: no edema.   Data Reviewed: Basic Metabolic Panel:  Recent Labs Lab 05/24/13 0525 05/25/13 0430 05/26/13 0457 05/27/13 0415  NA 136 139 137 140  K 3.0* 3.3* 3.8 3.6  CL 97 98 96 97  CO2 30 30 28 31   GLUCOSE 179* 154* 208* 215*  BUN 12 10 8 11   CREATININE 0.74 0.74 0.62 0.59  CALCIUM 9.4 9.8 9.5 10.3  MG  --  1.4*  --  1.6   Liver Function Tests:  Recent Labs Lab 05/24/13 0525  AST 10  ALT 5  ALKPHOS 81  BILITOT 0.5  PROT 6.6  ALBUMIN 2.6*   No results found for this basename: LIPASE, AMYLASE,  in the last 168 hours No results found for this basename: AMMONIA,  in the last 168 hours CBC:  Recent Labs Lab 05/24/13 0525 05/25/13 0430 05/26/13 0457 05/27/13 0415 05/28/13 0425  WBC 14.7* 11.2* 11.4* 15.1* 9.9  HGB 10.4* 10.3* 11.1* 10.4* 10.4*  HCT 31.6* 32.0* 34.2* 31.8* 31.7*  MCV 87.5 87.4 88.1 87.8 88.1  PLT 189 186 191 221 237   Cardiac Enzymes: No results found for this basename: CKTOTAL, CKMB, CKMBINDEX, TROPONINI,  in the last 168 hours BNP (last 3 results)  Recent Labs  06/24/12 1600 08/12/12 0948 05/25/13 1040  PROBNP 1253.0* 301.2 1061.0*   CBG:  Recent Labs Lab 05/27/13 1636 05/27/13 2000 05/27/13 2306 05/28/13 0324 05/28/13 0744  GLUCAP 369* 334* 217* 119* 200*    Recent Results (from the  past 240 hour(s))  MRSA PCR SCREENING     Status: Abnormal   Collection Time    05/23/13 11:50 PM      Result Value Range Status   MRSA by PCR POSITIVE (*) NEGATIVE Final   Comment:            The GeneXpert MRSA Assay (FDA     approved for NASAL specimens     only), is one component of a     comprehensive MRSA colonization     surveillance program. It is not     intended to diagnose MRSA     infection nor to guide or     monitor treatment for     MRSA infections.     RESULT CALLED TO, READ BACK BY AND VERIFIED WITH:     CALLED TO RN MELISSA HANCOCK [phone] @[phone]  [name]     Studies: No results found.  Scheduled  Meds: . antiseptic oral rinse  15 mL Mouth Rinse q12n4p  . azelastine  2 spray Each Nare BID  . calcitonin (salmon)  1 spray Alternating Nares Daily  . chlorhexidine  15 mL Mouth Rinse BID  . Chlorhexidine Gluconate Cloth  6 each Topical Q0600  . cyanocobalamin  1,000 mcg Intramuscular Q30 days  . ertapenem  1 g Intravenous Q24H  . insulin aspart  0-15 Units Subcutaneous Q4H  . latanoprost  1 drop Both Eyes QHS  . levothyroxine  25 mcg Intravenous Daily  . lidocaine  1 patch Transdermal Q24H  . metoprolol  5 mg Intravenous Q4H  . mupirocin ointment   Nasal BID  . sodium chloride  3 mL Intravenous Q12H   Continuous Infusions: . diltiazem (CARDIZEM) infusion 15 mg/hr (05/28/13 0600)    Principal Problem:   Diverticulitis of colon (without mention of hemorrhage) Active Problems:   DIABETES MELLITUS   HYPERLIPIDEMIA   OBESITY   HYPERTENSION   Atrial fibrillation   Occlusion and stenosis of carotid artery without mention of cerebral infarction   Chronic diastolic CHF (congestive heart failure)   Compression fracture of L1 lumbar vertebra   Cardiac pacemaker in situ   ILD (interstitial lung disease)   Atrial flutter    Time spent: 30 minutes.     Layton Tappan  [facility] Pager [phone]. If 7PM-7AM, please contact night-coverage at [email or web address], password [facility] 05/28/2013, 8:44 AM  LOS: 5 days

## 2013-05-29 ENCOUNTER — Inpatient Hospital Stay (HOSPITAL_COMMUNITY): Payer: Medicare Other

## 2013-05-29 LAB — GLUCOSE, CAPILLARY
Glucose-Capillary: 289 mg/dL — ABNORMAL HIGH (ref 70–99)
Glucose-Capillary: 318 mg/dL — ABNORMAL HIGH (ref 70–99)
Glucose-Capillary: 382 mg/dL — ABNORMAL HIGH (ref 70–99)

## 2013-05-29 LAB — BASIC METABOLIC PANEL
Creatinine, Ser: 0.63 mg/dL (ref 0.50–1.10)
GFR calc Af Amer: >90 mL/min (ref >90)
GFR calc non Af Amer: 82 mL/min — ABNORMAL LOW (ref >90)
Potassium: 3.1 mEq/L — ABNORMAL LOW (ref 3.5–5.1)
Sodium: 137 mEq/L (ref 135–145)

## 2013-05-29 LAB — CBC
Hemoglobin: 10.7 g/dL — ABNORMAL LOW (ref 12.0–15.0)
MCH: 28.2 pg (ref 26.0–34.0)
Platelets: 286 10*3/uL (ref 150–400)
RBC: 3.8 MIL/uL — ABNORMAL LOW (ref 3.87–5.11)
RDW: 13.3 % (ref 11.5–15.5)
WBC: 10.8 10*3/uL — ABNORMAL HIGH (ref 4.0–10.5)

## 2013-05-29 LAB — PROTIME-INR: Prothrombin Time: 15.2 seconds (ref 11.6–15.2)

## 2013-05-29 MED ORDER — SODIUM CHLORIDE 0.9 % IV SOLN
3.0000 g | Freq: Four times a day (QID) | INTRAVENOUS | Status: DC
  Filled 2013-05-29 (×3): qty 3

## 2013-05-29 MED ORDER — SODIUM CHLORIDE 0.9 % IV SOLN
3.0000 g | Freq: Four times a day (QID) | INTRAVENOUS | Status: DC
  Filled 2013-05-29 (×4): qty 3

## 2013-05-29 MED ORDER — SOTALOL HCL 80 MG PO TABS
80.0000 mg | ORAL_TABLET | Freq: Two times a day (BID) | ORAL | Status: DC
  Administered 2013-05-29 – 2013-06-06 (×17): 80 mg via ORAL
  Filled 2013-05-29 (×19): qty 1

## 2013-05-29 MED ORDER — POTASSIUM CHLORIDE CRYS ER 20 MEQ PO TBCR
40.0000 meq | EXTENDED_RELEASE_TABLET | Freq: Once | ORAL | Status: AC
  Administered 2013-05-29: 40 meq via ORAL
  Filled 2013-05-29: qty 2

## 2013-05-29 MED ORDER — LEVOTHYROXINE SODIUM 50 MCG PO TABS
50.0000 ug | ORAL_TABLET | Freq: Every day | ORAL | Status: DC
  Administered 2013-05-30 – 2013-06-06 (×8): 50 ug via ORAL
  Filled 2013-05-29 (×10): qty 1

## 2013-05-29 MED ORDER — SODIUM CHLORIDE 0.9 % IJ SOLN
10.0000 mL | INTRAMUSCULAR | Status: DC | PRN
  Administered 2013-06-01 – 2013-06-05 (×7): 10 mL
  Administered 2013-06-05: 20 mL
  Administered 2013-06-05: 10 mL
  Administered 2013-06-06: 20 mL

## 2013-05-29 MED ORDER — SODIUM CHLORIDE 0.9 % IV SOLN
3.0000 g | Freq: Four times a day (QID) | INTRAVENOUS | Status: DC
  Administered 2013-05-29 – 2013-05-31 (×6): 3 g via INTRAVENOUS
  Filled 2013-05-29 (×9): qty 3

## 2013-05-29 MED ORDER — SODIUM CHLORIDE 0.9 % IJ SOLN
10.0000 mL | Freq: Two times a day (BID) | INTRAMUSCULAR | Status: DC
  Administered 2013-05-29 – 2013-05-30 (×2): 20 mL
  Administered 2013-05-30: 10 mL
  Administered 2013-05-31: 12 mL
  Administered 2013-05-31 – 2013-06-01 (×3): 10 mL
  Administered 2013-06-03: 20 mL
  Administered 2013-06-03 – 2013-06-05 (×3): 10 mL

## 2013-05-29 MED ORDER — FUROSEMIDE 40 MG PO TABS
40.0000 mg | ORAL_TABLET | Freq: Two times a day (BID) | ORAL | Status: DC
  Administered 2013-05-29 – 2013-06-06 (×16): 40 mg via ORAL
  Filled 2013-05-29 (×19): qty 1

## 2013-05-29 NOTE — Progress Notes (Signed)
Physician notified: Regalado At: 1521  Regarding: OK for ativan x1 dose for PICC placement? IV team request some sort of "sedation".  Awaiting return response.   Returned Response at: 1522  Order(s): has order for PRN ativan PO

## 2013-05-29 NOTE — Clinical Social Work Psychosocial (Signed)
Clinical Social Work Department BRIEF PSYCHOSOCIAL ASSESSMENT 05/29/2013  Patient:  Alexandria [name], Alexandria [name]     Account Number:  [id number]     Admit date:  05/23/2013  Clinical Social Worker:  Lavell Luster  Date/Time:  05/29/2013 04:30 PM  Referred by:  Physician  Date Referred:  05/29/2013 Referred for  SNF Placement   Other Referral:   Interview type:  Patient Other interview type:   Patient alert and oriented at time of assessment.    PSYCHOSOCIAL DATA Living Status:  ALONE Admitted from facility:   Level of care:   Primary support name:  Alexandria [name] [phone]   [phone] Primary support relationship to patient:  SIBLING Degree of support available:   Support is limited.    CURRENT CONCERNS Current Concerns  Post-Acute Placement   Other Concerns:    SOCIAL WORK ASSESSMENT / PLAN Patient states that she was admitted from [facility] in [place], [place] in [place]. Patient states that she was in their rehab program and then came to [facility].Marland KitchenMarland KitchenPatient wants to go back to this rehab. Patient is agreeable to SNF fax out but insists that she go to this rehab program at [facility]. CSW explained that this cannot be guaranteed. Patient wants SNF in [facility] to be close to family.   Assessment/plan status:  Psychosocial Support/Ongoing Assessment of Needs Other assessment/ plan:   Complete FL2, Fax, PASRR   Information/referral to community resources:   CSW contact information and SNF list given to patient.    PATIENT'S/FAMILY'S RESPONSE TO PLAN OF CARE: Patient insists that she return to rehab at [facility] in [place], [place]. Patient was pleasant and appreciative of CSW contact. CSW will continue to follow.     Roddie Mc, [place], [place], [phone]

## 2013-05-29 NOTE — Progress Notes (Signed)
Subjective: Pt doing well.  Sitting up in chair eating full liquids on her own.  Denies N/V abdominal pain.  No BM's but having flatus.    Objective: Vital signs in last 24 hours: Temp:  [97.6 F (36.4 C)-98.1 F (36.7 C)] 97.6 F (36.4 C) (12/02 0700) Pulse Rate:  [88-122] 93 (12/02 0332) Resp:  [18-33] 33 (12/02 0332) BP: (110-153)/(59-75) 153/72 mmHg (12/02 0332) SpO2:  [94 %-97 %] 96 % (12/02 0332) Last BM Date:  (PTA)  Intake/Output from previous day: 12/01 0701 - 12/02 0700 In: 640 [P.O.:460; I.V.:180] Out: 1275 [Urine:1275] Intake/Output this shift:    PE: Gen:  Alert, NAD, pleasant Abd: Soft, NT/ND, +BS, no HSM  Lab Results:   Recent Labs  05/28/13 0425 05/29/13 0353  WBC 9.9 10.8*  HGB 10.4* 10.7*  HCT 31.7* 33.9*  PLT 237 286   BMET  Recent Labs  05/28/13 0945 05/29/13 0353  NA 136 137  K 3.6 3.1*  CL 96 96  CO2 30 34*  GLUCOSE 329* 175*  BUN 13 14  CREATININE 0.57 0.63  CALCIUM 10.2 10.3   PT/INR  Recent Labs  05/28/13 0425 05/29/13 0353  LABPROT 15.5* 15.2  INR 1.26 1.23   CMP     Component Value Date/Time   NA 137 05/29/2013 0353   NA 141 03/08/2013 1221   K 3.1* 05/29/2013 0353   CL 96 05/29/2013 0353   CO2 34* 05/29/2013 0353   GLUCOSE 175* 05/29/2013 0353   GLUCOSE 160* 03/08/2013 1221   BUN 14 05/29/2013 0353   BUN 20 03/08/2013 1221   CREATININE 0.63 05/29/2013 0353   CREATININE 0.96 12/05/2012 1251   CALCIUM 10.3 05/29/2013 0353   PROT 6.6 05/24/2013 0525   PROT 7.1 03/08/2013 1221   ALBUMIN 2.6* 05/24/2013 0525   AST 10 05/24/2013 0525   ALT 5 05/24/2013 0525   ALKPHOS 81 05/24/2013 0525   BILITOT 0.5 05/24/2013 0525   GFRNONAA 82* 05/29/2013 0353   GFRAA >90 05/29/2013 0353   Lipase     Component Value Date/Time   LIPASE 32 08/12/2012 0948       Studies/Results: No results found.  Anti-infectives: Anti-infectives   Start     Dose/Rate Route Frequency Ordered Stop   05/25/13 1130  ertapenem (INVANZ) 1 g in  sodium chloride 0.9 % 50 mL IVPB     1 g 100 mL/hr over 30 Minutes Intravenous Every 24 hours 05/25/13 1126     05/23/13 2200  metroNIDAZOLE (FLAGYL) IVPB 500 mg  Status:  Discontinued     500 mg 100 mL/hr over 60 Minutes Intravenous Every 8 hours 05/23/13 2140 05/25/13 1126   05/23/13 2145  ciprofloxacin (CIPRO) IVPB 400 mg  Status:  Discontinued     400 mg 200 mL/hr over 60 Minutes Intravenous Every 12 hours 05/23/13 2140 05/25/13 1126       Assessment/Plan Acute perforated diverticulitis colon Hypokalemia 3.1 HTN H/o afib Chronic DHF DM ILD Anxiety/delerium   Plan: 1.  Improving on abx, WBC normalizing (10.8) 2.  IVF, pain control, antibiotics 3.  Called ID regarding length of abx tx and PO abx to be given at D/C, will switch Invanz 4/4 to Unasyn which will help [place] to determine if we can d/c her on Augmentin.  Monitor WBC.  Will treat as OP with abx for a total of 10-14 days. 4.  Does not appear to need surgical intervention at this time.  She is tolerating a full liquid diet and  can be switched to low residue diet as tolerated 5.  Could be ready for d/c tomorrow from surgical perspective if tolerates diet, d/c foley if okay with medicine, okay to transfer to floor from our perspective 6.  D/c IV narcotics and encourage orals 7.  F/u with Dr. Derrell Lolling in 2-3 weeks, will need OP colonoscopy, last one in 2011 - 6 weeks from now    LOS: 6 days    DORT, [facility] 05/29/2013, 8:31 AM Pager: [phone]

## 2013-05-29 NOTE — Progress Notes (Signed)
Physician notified: Regalado At: 1508  Regarding: Pt. Has phlebitis in current PIV. IV team says needs line, very hard stick. OK to place? Pt. Refused PICC yesterday. Awaiting return response.   Returned Response at: 1512  Order(s): Place PICC

## 2013-05-29 NOTE — Progress Notes (Signed)
Agree with A&P of MD,PA. Patient sitting up in chair and comfortable when I came by. Hopefully will continue to improve.

## 2013-05-29 NOTE — Progress Notes (Signed)
SUBJECTIVE:  She denies any pain.  No acute SOB.   PHYSICAL EXAM Filed Vitals:   05/29/13 0332 05/29/13 0700 05/29/13 0716 05/29/13 1036  BP: 153/72  105/67 108/26  Pulse: 93  106 109  Temp: 97.9 F (36.6 C) 97.6 F (36.4 C)  98.8 F (37.1 C)  TempSrc: Oral Oral  Oral  Resp: 33  30 30  Height:      Weight:      SpO2: 96%  99% 98%   General:  No distress Lungs:  Dry crackles Heart:  Irregular Abdomen:  Positive bowel sounds, no rebound no guarding Extremities:  No edema  LABS:  Results for orders placed during the hospital encounter of 05/23/13 (from the past 24 hour(s))  GLUCOSE, CAPILLARY     Status: Abnormal   Collection Time    05/28/13  4:58 PM      Result Value Range   Glucose-Capillary 318 (*) 70 - 99 mg/dL  GLUCOSE, CAPILLARY     Status: Abnormal   Collection Time    05/28/13  9:03 PM      Result Value Range   Glucose-Capillary 299 (*) 70 - 99 mg/dL   Comment 1 Documented in Chart     Comment 2 Notify RN    GLUCOSE, CAPILLARY     Status: Abnormal   Collection Time    05/29/13 12:06 AM      Result Value Range   Glucose-Capillary 289 (*) 70 - 99 mg/dL   Comment 1 Documented in Chart     Comment 2 Notify RN    GLUCOSE, CAPILLARY     Status: Abnormal   Collection Time    05/29/13  3:30 AM      Result Value Range   Glucose-Capillary 174 (*) 70 - 99 mg/dL   Comment 1 Documented in Chart     Comment 2 Notify RN    PROTIME-INR     Status: None   Collection Time    05/29/13  3:53 AM      Result Value Range   Prothrombin Time 15.2  11.6 - 15.2 seconds   INR 1.23  0.00 - 1.49  BASIC METABOLIC PANEL     Status: Abnormal   Collection Time    05/29/13  3:53 AM      Result Value Range   Sodium 137  135 - 145 mEq/L   Potassium 3.1 (*) 3.5 - 5.1 mEq/L   Chloride 96  96 - 112 mEq/L   CO2 34 (*) 19 - 32 mEq/L   Glucose, Bld 175 (*) 70 - 99 mg/dL   BUN 14  6 - 23 mg/dL   Creatinine, Ser 4.09  0.50 - 1.10 mg/dL   Calcium 81.1  8.4 - 91.4 mg/dL   GFR calc  non Af Amer 82 (*) >90 mL/min   GFR calc Af Amer >90  >90 mL/min  CBC     Status: Abnormal   Collection Time    05/29/13  3:53 AM      Result Value Range   WBC 10.8 (*) 4.0 - 10.5 K/uL   RBC 3.80 (*) 3.87 - 5.11 MIL/uL   Hemoglobin 10.7 (*) 12.0 - 15.0 g/dL   HCT 78.2 (*) 95.6 - 21.3 %   MCV 89.2  78.0 - 100.0 fL   MCH 28.2  26.0 - 34.0 pg   MCHC 31.6  30.0 - 36.0 g/dL   RDW 08.6  57.8 - 46.9 %   Platelets 286  150 - 400 K/uL  GLUCOSE, CAPILLARY     Status: Abnormal   Collection Time    05/29/13  7:49 AM      Result Value Range   Glucose-Capillary 207 (*) 70 - 99 mg/dL   Comment 1 Notify RN     Comment 2 Documented in Chart    GLUCOSE, CAPILLARY     Status: Abnormal   Collection Time    05/29/13 12:05 PM      Result Value Range   Glucose-Capillary 318 (*) 70 - 99 mg/dL   Comment 1 Notify RN     Comment 2 Documented in Chart      Intake/Output Summary (Last 24 hours) at 05/29/13 1333 Last data filed at 05/29/13 1100  Gross per 24 hour  Intake    180 ml  Output   1000 ml  Net   -820 ml     ASSESSMENT AND PLAN:  ATRIAL FIB:   Rate still elevated.  I will restart 80 mg bid Sotalol.  Continue IV Cardizem today.    ACUTE ON CHRONIC DIASTOLIC HF:   Switch to oral Lasix.     [name] 05/29/2013 1:33 PM

## 2013-05-29 NOTE — Progress Notes (Signed)
Chaplain made initial visit. Pt was sitting up and alert. Pt said she was doing okay and that she had been going to the same church for 80 years. [place] and that, while her minister is not able to visit her, her congregation prays for her and sends her card. Chaplain reflected that faith seems to be a great source of strength for her. She confirmed this. From here, pt began talking about the loss of her husband and a child she had who died when she was a baby. She stated that she still misses her baby who died after being born premature. Chaplain practiced active listening and empathic presence as she shared her grief. Chaplain asked how she could support pt while she's at hospital and pt asked for prayer. Chaplain said prayer for full recovery and for support from her friends and family. Chaplain will follow up as necessary.

## 2013-05-29 NOTE — Progress Notes (Signed)
[facility] PROGRESS NOTE  Alexandria [name] ZOX:[id number] DOB: 07-21-1929 DOA: 05/23/2013 PCP: [name], MD  Assessment/Plan: Alexandria [name] is an 79 y.o. female with hx of afib on chronic anticoagulation, hx of B12 deficiency, essential tremor, ILD, hyperlipidemia, DM, s/p PPM who presented to [facility] hospital with abdominal pain. She was found to have a perforated diverticulitis by CT and subsequently transferred to [facility] per patient and family's request. Patient hospital course also complicated by A fib RVR and Heart failure exacerbation. She is still on Cardizem Gtt. Cardiologist has been helping with management. For perforated diverticulitis she has been getting IV antibiotics. Initially IV ciprofloxacin and Flagyl, this was change to [facility].   1-Perforated Diverticulitis: Patient received 4 days of IV INVANZ.  Patient received ciprofloxacin  and flagyl for one day. WBC decrease to 9. Tolerating full liquid diet. INVAZ change to Unasyn to determine if patient will be able to be discharge on Augment at that time. -Improving clinically.   2-Hypertension: Hold Norvasc, Imdur.   3-History of A fib: Continue with  Cardizem Gtt. Holding coumadin in case patient required surgery intervention. INR corrected with FFP 11-28  and vitamin K. INR at 1.2.  -Started on Sotalol. 12-2.   4-Acute on Chronic diastolic Heart Failure; Cardiology following. Monitor for pulmonary edema. Starter don oral lasix.   5-Diabetes: SSI. Hold long acting insulin due to NPO status.   6-ILD: stable.   7-Hypokalemia: replete with 40 meq times 2.  8-Anxiety/ delirium: ativan PRN. Improved.   Code Status: Full Code.  Family Communication: Care discussed with patient.  Disposition Plan: Remain in the step down unit.    Consultants:  Surgery  Procedures:  none  Antibiotics:  Ciprofloxacin 11-26---11-27  Flagyl 11-26----11-27  Invanz 11-27---12-02  Unasyn 12-02  HPI/Subjective: She is feeling  better. Denies dyspnea. Denies abdominal pain. She has been tolerating clear diet.   Objective: Filed Vitals:   05/29/13 1500  BP:   Pulse:   Temp: 97.8 F (36.6 C)  Resp:     Intake/Output Summary (Last 24 hours) at 05/29/13 1723 Last data filed at 05/29/13 1100  Gross per 24 hour  Intake    180 ml  Output    925 ml  Net   -745 ml   Filed Weights   05/23/13 1915 05/24/13 0400  Weight: 81.5 kg (179 lb 10.8 oz) 82.4 kg (181 lb 10.5 oz)    Exam:   General: in no distress. Calm.   Cardiovascular: S 1, S 2 RRR   Respiratory: bilateral crackles.   Abdomen: Bs present, soft, no tenderness.   Musculoskeletal: no edema.   Data Reviewed: Basic Metabolic Panel:  Recent Labs Lab 05/25/13 0430 05/26/13 0457 05/27/13 0415 05/28/13 0945 05/29/13 0353  NA 139 137 140 136 137  K 3.3* 3.8 3.6 3.6 3.1*  CL 98 96 97 96 96  CO2 30 28 31 30  34*  GLUCOSE 154* 208* 215* 329* 175*  BUN 10 8 11 13 14   CREATININE 0.74 0.62 0.59 0.57 0.63  CALCIUM 9.8 9.5 10.3 10.2 10.3  MG 1.4*  --  1.6  --   --    Liver Function Tests:  Recent Labs Lab 05/24/13 0525  AST 10  ALT 5  ALKPHOS 81  BILITOT 0.5  PROT 6.6  ALBUMIN 2.6*   No results found for this basename: LIPASE, AMYLASE,  in the last 168 hours No results found for this basename: AMMONIA,  in the last 168 hours CBC:  Recent  Labs Lab 05/25/13 0430 05/26/13 0457 05/27/13 0415 05/28/13 0425 05/29/13 0353  WBC 11.2* 11.4* 15.1* 9.9 10.8*  HGB 10.3* 11.1* 10.4* 10.4* 10.7*  HCT 32.0* 34.2* 31.8* 31.7* 33.9*  MCV 87.4 88.1 87.8 88.1 89.2  PLT 186 191 221 237 286   Cardiac Enzymes: No results found for this basename: CKTOTAL, CKMB, CKMBINDEX, TROPONINI,  in the last 168 hours BNP (last 3 results)  Recent Labs  06/24/12 1600 08/12/12 0948 05/25/13 1040  PROBNP 1253.0* 301.2 1061.0*   CBG:  Recent Labs Lab 05/29/13 0006 05/29/13 0330 05/29/13 0749 05/29/13 1205 05/29/13 1610  GLUCAP 289* 174* 207*  318* 382*    Recent Results (from the past 240 hour(s))  MRSA PCR SCREENING     Status: Abnormal   Collection Time    05/23/13 11:50 PM      Result Value Range Status   MRSA by PCR POSITIVE (*) NEGATIVE Final   Comment:            The GeneXpert MRSA Assay (FDA     approved for NASAL specimens     only), is one component of a     comprehensive MRSA colonization     surveillance program. It is not     intended to diagnose MRSA     infection nor to guide or     monitor treatment for     MRSA infections.     RESULT CALLED TO, READ BACK BY AND VERIFIED WITH:     CALLED TO RN MELISSA HANCOCK [phone] @[phone]  [name]     Studies: No results found.  Scheduled Meds: . ampicillin-sulbactam (UNASYN) IV  3 g Intravenous Q6H  . antiseptic oral rinse  15 mL Mouth Rinse q12n4p  . azelastine  2 spray Each Nare BID  . calcitonin (salmon)  1 spray Alternating Nares Daily  . chlorhexidine  15 mL Mouth Rinse BID  . Chlorhexidine Gluconate Cloth  6 each Topical Q0600  . cyanocobalamin  1,000 mcg Intramuscular Q30 days  . furosemide  40 mg Oral BID  . insulin aspart  0-15 Units Subcutaneous Q4H  . latanoprost  1 drop Both Eyes QHS  . [START ON 05/30/2013] levothyroxine  50 mcg Oral QAC breakfast  . lidocaine  1 patch Transdermal Q24H  . mupirocin ointment   Nasal BID  . potassium chloride  40 mEq Oral Once  . potassium chloride  40 mEq Oral Once  . sodium chloride  3 mL Intravenous Q12H  . sotalol  80 mg Oral Q12H   Continuous Infusions: . diltiazem (CARDIZEM) infusion 15 mg/hr (05/29/13 0600)    Principal Problem:   Diverticulitis of colon (without mention of hemorrhage) Active Problems:   DIABETES MELLITUS   HYPERLIPIDEMIA   OBESITY   HYPERTENSION   Atrial fibrillation   Acute on chronic diastolic heart failure   Occlusion and stenosis of carotid artery without mention of cerebral infarction   Compression fracture of L1 lumbar vertebra   Cardiac pacemaker in situ   ILD  (interstitial lung disease)   Atrial flutter    Time spent: 30 minutes.     [name]  [facility] Pager [phone]. If 7PM-7AM, please contact night-coverage at [email or web address], password [facility] 05/29/2013, 5:23 PM  LOS: 6 days

## 2013-05-29 NOTE — Progress Notes (Signed)
Peripherally Inserted Central Catheter/Midline Placement  The IV Nurse has discussed with the patient and/or persons authorized to consent for the patient, the purpose of this procedure and the potential benefits and risks involved with this procedure.  The benefits include less needle sticks, lab draws from the catheter and patient may be discharged home with the catheter.  Risks include, but not limited to, infection, bleeding, blood clot (thrombus formation), and puncture of an artery; nerve damage and irregular heat beat.  Alternatives to this procedure were also discussed.  PICC/Midline Placement Documentation  PICC / Midline Double Lumen 05/29/13 PICC Right Basilic 41 cm 0 cm (Active)  Indication for Insertion or Continuance of Line Poor Vasculature-patient has had multiple peripheral attempts or PIVs lasting less than 24 hours 05/29/2013  5:00 PM  Exposed Catheter (cm) 0 cm 05/29/2013  5:00 PM  Dressing Change Due 06/05/13 05/29/2013  5:00 PM       Stacie Glaze Horton 05/29/2013, 5:27 PM

## 2013-05-30 ENCOUNTER — Inpatient Hospital Stay (HOSPITAL_COMMUNITY): Payer: Medicare Other

## 2013-05-30 LAB — BASIC METABOLIC PANEL
Calcium: 10 mg/dL (ref 8.4–10.5)
GFR calc Af Amer: >90 mL/min (ref >90)
GFR calc non Af Amer: 84 mL/min — ABNORMAL LOW (ref >90)
Glucose, Bld: 127 mg/dL — ABNORMAL HIGH (ref 70–99)
Sodium: 140 mEq/L (ref 135–145)

## 2013-05-30 LAB — GLUCOSE, CAPILLARY
Glucose-Capillary: 357 mg/dL — ABNORMAL HIGH (ref 70–99)
Glucose-Capillary: 130 mg/dL — ABNORMAL HIGH (ref 70–99)
Glucose-Capillary: 277 mg/dL — ABNORMAL HIGH (ref 70–99)
Glucose-Capillary: 325 mg/dL — ABNORMAL HIGH (ref 70–99)

## 2013-05-30 LAB — CBC
MCH: 28.2 pg (ref 26.0–34.0)
MCHC: 31.5 g/dL (ref 30.0–36.0)
Platelets: 322 10*3/uL (ref 150–400)
WBC: 12.2 10*3/uL — ABNORMAL HIGH (ref 4.0–10.5)

## 2013-05-30 LAB — PROTIME-INR: Prothrombin Time: 14.8 seconds (ref 11.6–15.2)

## 2013-05-30 LAB — HEPARIN LEVEL (UNFRACTIONATED): Heparin Unfractionated: <0.1 IU/mL — ABNORMAL LOW (ref 0.30–0.70)

## 2013-05-30 MED ORDER — HEPARIN BOLUS VIA INFUSION
3000.0000 [IU] | Freq: Once | INTRAVENOUS | Status: AC
  Administered 2013-05-30: 3000 [IU] via INTRAVENOUS
  Filled 2013-05-30: qty 3000

## 2013-05-30 MED ORDER — HEPARIN BOLUS VIA INFUSION
2000.0000 [IU] | Freq: Once | INTRAVENOUS | Status: AC
  Administered 2013-05-30: 2000 [IU] via INTRAVENOUS
  Filled 2013-05-30: qty 2000

## 2013-05-30 MED ORDER — HEPARIN (PORCINE) IN NACL 100-0.45 UNIT/ML-% IJ SOLN
850.0000 [IU]/h | INTRAMUSCULAR | Status: DC
  Administered 2013-05-30: 850 [IU]/h via INTRAVENOUS
  Filled 2013-05-30: qty 250

## 2013-05-30 MED ORDER — DILTIAZEM HCL 30 MG PO TABS
30.0000 mg | ORAL_TABLET | Freq: Four times a day (QID) | ORAL | Status: DC
  Administered 2013-05-30 – 2013-06-03 (×15): 30 mg via ORAL
  Filled 2013-05-30 (×20): qty 1

## 2013-05-30 MED ORDER — IOHEXOL 350 MG/ML SOLN
50.0000 mL | Freq: Once | INTRAVENOUS | Status: AC | PRN
  Administered 2013-05-30: 50 mL via INTRAVENOUS

## 2013-05-30 MED ORDER — HEPARIN (PORCINE) IN NACL 100-0.45 UNIT/ML-% IJ SOLN
1250.0000 [IU]/h | INTRAMUSCULAR | Status: DC
  Administered 2013-05-31: 1250 [IU]/h via INTRAVENOUS
  Administered 2013-05-31: 1050 [IU]/h via INTRAVENOUS
  Administered 2013-05-31: 1250 [IU]/h via INTRAVENOUS
  Filled 2013-05-30 (×2): qty 250

## 2013-05-30 MED ORDER — WARFARIN - PHARMACIST DOSING INPATIENT: Freq: Every day | Status: DC

## 2013-05-30 MED ORDER — WARFARIN SODIUM 6 MG PO TABS
6.0000 mg | ORAL_TABLET | Freq: Once | ORAL | Status: AC
  Administered 2013-05-30: 6 mg via ORAL
  Filled 2013-05-30: qty 1

## 2013-05-30 NOTE — Progress Notes (Signed)
ANTICOAGULATION CONSULT NOTE - Follow Up Consult  Pharmacy Consult for heparin Indication: atrial fibrillation  Allergies  Allergen Reactions  . Prednisone Other (See Comments)    REACTION: "retained fluid"  . Ace Inhibitors Cough       . Crestor [Rosuvastatin Calcium] Other (See Comments)    Fatigue and leg pain   . Lipitor [Atorvastatin Calcium] Other (See Comments)    Myalgia and fatigue   . Niaspan [Niacin] Other (See Comments)    unknown  . Olmesartan Cough    Patient Measurements: Height: 5\' 3"  (160 cm) Weight: 181 lb 10.5 oz (82.4 kg) IBW/kg (Calculated) : 52.4 Heparin Dosing Weight: 61kg  Vital Signs: Temp: 97.9 F (36.6 C) (12/03 2016) Temp src: Oral (12/03 2016) BP: 138/58 mmHg (12/03 2016) Pulse Rate: 89 (12/03 2016)  Labs:  Recent Labs  05/28/13 0425 05/28/13 0945 05/29/13 0353 05/30/13 0430 05/30/13 2150  HGB 10.4*  --  10.7* 10.2*  --   HCT 31.7*  --  33.9* 32.4*  --   PLT 237  --  286 322  --   LABPROT 15.5*  --  15.2 14.8  --   INR 1.26  --  1.23 1.19  --   HEPARINUNFRC  --   --   --   --  <0.10*  CREATININE  --  0.57 0.63 0.59  --     Estimated Creatinine Clearance: 56.1 ml/min (by C-G formula based on Cr of 0.59).   Medications:   Heparin at 850 units/hr  Assessment: 83 YOF transferred from an outside hospital with perforated diverticulitis. Was on warfarin PTA which was reversed in case she needed surgery. Restarted heparin this morning as a bridge to therapeutic warfarin. Initial heparin level undetectable. No bleeding noted. Per RN no issues with line and drip not held for any reason.  Goal of Therapy:  Heparin level 0.3-0.7 units/ml Monitor platelets by anticoagulation protocol: Yes   Plan:  1. Re-bolus with 2000 units heparin IV x1 2. Increase heparin drip to 1050 units/hr 3. Next heparin level with AM labs 4. Daily heparin level and CBC  Roma Bierlein D. Tommie Dejoseph, PharmD, BCPS Clinical Pharmacist Pager: [phone] 05/30/2013 10:26  PM

## 2013-05-30 NOTE — Progress Notes (Signed)
SUBJECTIVE:  She denies any pain.  No acute SOB.  Somnolent this AM.    PHYSICAL EXAM Filed Vitals:   05/30/13 0200 05/30/13 0300 05/30/13 0345 05/30/13 0400  BP: 123/64 124/74 121/65 124/64  Pulse: 78 65 66   Temp:   97.9 F (36.6 C)   TempSrc:   Oral   Resp: 19 21 30  35  Height:      Weight:      SpO2: 98% 98% 98%    General:  No distress Lungs:  Dry crackles Heart:  Irregular Abdomen:  Positive bowel sounds, no rebound no guarding Extremities:  No edema  LABS:  Results for orders placed during the hospital encounter of 05/23/13 (from the past 24 hour(s))  GLUCOSE, CAPILLARY     Status: Abnormal   Collection Time    05/29/13  7:49 AM      Result Value Range   Glucose-Capillary 207 (*) 70 - 99 mg/dL   Comment 1 Notify RN     Comment 2 Documented in Chart    GLUCOSE, CAPILLARY     Status: Abnormal   Collection Time    05/29/13 12:05 PM      Result Value Range   Glucose-Capillary 318 (*) 70 - 99 mg/dL   Comment 1 Notify RN     Comment 2 Documented in Chart    GLUCOSE, CAPILLARY     Status: Abnormal   Collection Time    05/29/13  4:10 PM      Result Value Range   Glucose-Capillary 382 (*) 70 - 99 mg/dL  GLUCOSE, CAPILLARY     Status: Abnormal   Collection Time    05/29/13  7:53 PM      Result Value Range   Glucose-Capillary 357 (*) 70 - 99 mg/dL   Comment 1 Notify RN     Comment 2 Documented in Chart    GLUCOSE, CAPILLARY     Status: Abnormal   Collection Time    05/29/13 11:42 PM      Result Value Range   Glucose-Capillary 197 (*) 70 - 99 mg/dL   Comment 1 Notify RN     Comment 2 Documented in Chart    GLUCOSE, CAPILLARY     Status: Abnormal   Collection Time    05/30/13  3:46 AM      Result Value Range   Glucose-Capillary 130 (*) 70 - 99 mg/dL   Comment 1 Notify RN     Comment 2 Documented in Chart    PROTIME-INR     Status: None   Collection Time    05/30/13  4:30 AM      Result Value Range   Prothrombin Time 14.8  11.6 - 15.2 seconds   INR  1.19  0.00 - 1.49  CBC     Status: Abnormal   Collection Time    05/30/13  4:30 AM      Result Value Range   WBC 12.2 (*) 4.0 - 10.5 K/uL   RBC 3.62 (*) 3.87 - 5.11 MIL/uL   Hemoglobin 10.2 (*) 12.0 - 15.0 g/dL   HCT 16.1 (*) 09.6 - 04.5 %   MCV 89.5  78.0 - 100.0 fL   MCH 28.2  26.0 - 34.0 pg   MCHC 31.5  30.0 - 36.0 g/dL   RDW 40.9  81.1 - 91.4 %   Platelets 322  150 - 400 K/uL  BASIC METABOLIC PANEL     Status: Abnormal   Collection  Time    05/30/13  4:30 AM      Result Value Range   Sodium 140  135 - 145 mEq/L   Potassium 5.1  3.5 - 5.1 mEq/L   Chloride 101  96 - 112 mEq/L   CO2 34 (*) 19 - 32 mEq/L   Glucose, Bld 127 (*) 70 - 99 mg/dL   BUN 15  6 - 23 mg/dL   Creatinine, Ser 1.61  0.50 - 1.10 mg/dL   Calcium 09.6  8.4 - 04.5 mg/dL   GFR calc non Af Amer 84 (*) >90 mL/min   GFR calc Af Amer >90  >90 mL/min    Intake/Output Summary (Last 24 hours) at 05/30/13 0708 Last data filed at 05/30/13 0600  Gross per 24 hour  Intake    385 ml  Output    900 ml  Net   -515 ml     ASSESSMENT AND PLAN:  ATRIAL FIB:   Restarted Sotalol.   I would like to start heparin and warfarin if OK with primary team.  I will change to oral Cardizem.  If we cannot use anticoagulation safely today I will stop the Sotalol and she will need to remain in afib until she can be on therapeutic anticoagulation for 3 weeks.    ACUTE ON CHRONIC DIASTOLIC HF:   Switched to oral Lasix yesterday.     Alexandria [name] 05/30/2013 7:08 AM

## 2013-05-30 NOTE — Progress Notes (Signed)
Inpatient Diabetes Program Recommendations  AACE/ADA: New Consensus Statement on Inpatient Glycemic Control (2013)  Target Ranges:  Prepandial:   less than 140 mg/dL      Peak postprandial:   less than 180 mg/dL (1-2 hours)      Critically ill patients:  140 - 180 mg/dL     Results for LACREASHA, HINDS (MRN [id number]) as of 05/30/2013 14:35  Ref. Range 05/29/2013 00:06 05/29/2013 03:30 05/29/2013 07:49 05/29/2013 12:05 05/29/2013 16:10 05/29/2013 19:53  Glucose-Capillary Latest Range: 70-99 mg/dL 213 (H) 086 (H) 578 (H) 318 (H) 382 (H) 357 (H)    Results for LASHICA, HANNAY (MRN [id number]) as of 05/30/2013 14:35  Ref. Range 05/29/2013 23:42 05/30/2013 03:46 05/30/2013 08:49 05/30/2013 11:09  Glucose-Capillary Latest Range: 70-99 mg/dL 413 (H) 244 (H) 010 (H) 277 (H)    **Patient now on FL diet.  Having postprandial glucose elevations.  **MD- Please consider the following in-hospital insulin adjustments:  1. Change Novolog Moderate SSI to tid ac + HS (currently ordered as Q4 hours) 2. Add Novolog meal coverage- Novolog 4 units tid with meals   Will follow Ambrose Finland RN, MSN, CDE Diabetes Coordinator Inpatient Diabetes Program Team Pager: [phone] (8a-10p)

## 2013-05-30 NOTE — Progress Notes (Signed)
Occupational Therapy Evaluation Patient Details Name: Alexandria [name] MRN: [id number] DOB: 1930/05/29 Today's Date: 05/30/2013 Time: 4098-1191 OT Time Calculation (min): 44 min  OT Assessment / Plan / Recommendation History of present illness Pt admitted for abdominal pain and was found to have diverticulitis and perforated bowel but no surgical intervention necessary. PT observation/assessment pt presented with R hand weakness co-ordination deficits.   Clinical Impression   PTA, pt was undergoing rehab at [facility] in [place] s/p fall/?back surgery. Pt was ready for D/C from rehab when she was admitted to [facility]. Pt with significant functional decline with ADL and mobility. Pt with apparent RUE weakness and mild R inattention. Pt c/o vision being "dark". Pt will need SNF for rehab. Pt will benefit from skilled OT services to facilitate D/C to next venue due to below deficits.    OT Assessment  Patient needs continued OT Services    Follow Up Recommendations  SNF    Barriers to Discharge Decreased caregiver support    Equipment Recommendations  None recommended by OT    Recommendations for Other Services    Frequency  Min 2X/week    Precautions / Restrictions Precautions Precautions: Fall Precaution Comments: back pain Restrictions Weight Bearing Restrictions: No   Pertinent Vitals/Pain Vitals stable    ADL  Eating/Feeding: Set up Where Assessed - Eating/Feeding: Chair Grooming: Moderate assistance Where Assessed - Grooming: Supported sitting Upper Body Bathing: Moderate assistance Where Assessed - Upper Body Bathing: Supported sitting Lower Body Bathing: +1 Total assistance Where Assessed - Lower Body Bathing: Supported sit to stand Upper Body Dressing: Moderate assistance Where Assessed - Upper Body Dressing: Supported sitting Lower Body Dressing: +1 Total assistance Where Assessed - Lower Body Dressing: Supported sit to Pharmacist, hospital: +2 Total assistance Toilet  Transfer: Patient Percentage: 30% Acupuncturist: Other (comment) (simulated bed - chair) Toileting - Clothing Manipulation and Hygiene: +1 Total assistance (foley) Equipment Used: Gait belt Transfers/Ambulation Related to ADLs: +2 total A ADL Comments: significant decline    OT Diagnosis: Generalized weakness;Acute pain;Cognitive deficits;Disturbance of vision  OT Problem List: Decreased strength;Decreased activity tolerance;Impaired vision/perception;Decreased coordination;Decreased cognition;Decreased safety awareness;Decreased knowledge of use of DME or AE;Decreased knowledge of precautions;Cardiopulmonary status limiting activity;Obesity;Pain OT Treatment Interventions: Self-care/ADL training;Therapeutic exercise;Neuromuscular education;Energy conservation;DME and/or AE instruction;Therapeutic activities;Cognitive remediation/compensation;Visual/perceptual remediation/compensation;Patient/family education   OT Goals(Current goals can be found in the care plan section) Acute Rehab OT Goals Patient Stated Goal: to have my brother pick me up OT Goal Formulation: With patient Time For Goal Achievement: 06/13/13 Potential to Achieve Goals: Good  Visit Information  Last OT Received On: 05/30/13 Assistance Needed: +2 History of Present Illness: Pt admitted for abdominal pain and was found to have diverticulitis and perforated bowel but no surgical intervention necessary. PT observation/assessment pt presented with R hand weakness co-ordination deficits.       Prior Functioning     Home Living Family/patient expects to be discharged to:: Skilled nursing facility Living Arrangements: Alone Additional Comments: pt was at [facility] for rehab since october secondary to fall and about to be D/C back home Prior Function Comments: Spoke with Kennedy Bucker, PT at [facility] and he reports they were getting ready to d/c her 2 days PTA as pt was walking independently with RW and was  indep with dressing/bathing and feeding self. PT reports on 11/24 pt was doing 15 min on Nu Step. Communication Communication: No difficulties Dominant Hand: Right         Vision/Perception Vision - History Baseline Vision:  Wears glasses only for reading Patient Visual Report: Blurring of vision;Other (comment) ("darkness") Vision - Assessment Eye Alignment: Within Functional Limits Vision Assessment: Vision impaired - to be further tested in functional context Additional Comments: slow saccades. Reports vision as "dark". Reports that R side of therapists face is dark Perception Perception: Impaired (mild r inattention) Praxis Praxis: Intact   Cognition  Cognition Arousal/Alertness: Awake/alert (however pt reports being sleepy) Behavior During Therapy: Anxious Overall Cognitive Status: No family/caregiver present to determine baseline cognitive functioning (appaent cognitive) Memory: Decreased short-term memory    Extremity/Trunk Assessment Upper Extremity Assessment Upper Extremity Assessment: RUE deficits/detail RUE Deficits / Details: RUE weakness. Decreased coordination. appears to demonstrate R inattention RUE Coordination: decreased fine motor;decreased gross motor Lower Extremity Assessment Lower Extremity Assessment: Generalized weakness Cervical / Trunk Assessment Cervical / Trunk Assessment: Normal     Mobility Bed Mobility Bed Mobility: Supine to Sit;Sitting - Scoot to Edge of Bed Supine to Sit: 3: Mod assist;With rails;HOB flat Sitting - Scoot to [place] of Bed: 4: Min assist Details for Bed Mobility Assistance: Assist for trunk elevation, pt with difficulty using R UE to assist with transfer Transfers Sit to Stand: 1: +2 Total assist;With upper extremity assist;From bed;From chair/3-in-1 Sit to Stand: Patient Percentage: 50% Stand to Sit: 1: +2 Total assist;With upper extremity assist;To chair/3-in-1 Stand to Sit: Patient Percentage: 50% Details for Transfer  Assistance: max assist for anterior weight shift to obtain upright position. Pt with strong L lateral lean. assist at hips to achieve extension. modA for walker management to complete stand pvt transfer     Exercise Other Exercises Other Exercises: BUE AROM   Balance Balance Balance Assessed: Yes Static Sitting Balance - pt able to maintain midline postural control for short periods. Unaware of excessive lean anteriorly. Posture worsened as pt was distracted Static Sitting -  End of Session OT - End of Session Equipment Utilized During Treatment: Gait belt Activity Tolerance: Patient limited by fatigue;Patient limited by pain Patient left: in bed;with call bell/phone within reach Nurse Communication: Mobility status;Need for lift equipment (use Sarah plus)  GO     Destynie Toomey,HILLARY 05/30/2013, 11:28 AM Luisa Dago, OTR/L  [phone] 05/30/2013

## 2013-05-30 NOTE — Progress Notes (Signed)
Subjective: Pt feels fine.  No pain, N/V.  However, PT noted some weakness with feeding herself compared to yesterday and are concerned about an infarct.  She's pending a scan.  WBC jumped a bit, but no change in symptoms.  Working with PT/OT.    Objective: Vital signs in last 24 hours: Temp:  [97.6 F (36.4 C)-98.8 F (37.1 C)] 97.6 F (36.4 C) (12/03 0718) Pulse Rate:  [65-115] 76 (12/03 0718) Resp:  [19-35] 29 (12/03 0718) BP: (108-134)/(26-74) 119/57 mmHg (12/03 0718) SpO2:  [93 %-100 %] 100 % (12/03 0718) Last BM Date:  (PTA)  Intake/Output from previous day: 12/02 0701 - 12/03 0700 In: 385 [I.V.:285; IV Piggyback:100] Out: 900 [Urine:900] Intake/Output this shift: Total I/O In: -  Out: 200 [Urine:200]  PE: Gen:  Alert, NAD, pleasant Abd: Soft, NT/ND, +BS, no HSM Ext:  Gross motor intact b/l UE/LE  Lab Results:   Recent Labs  05/29/13 0353 05/30/13 0430  WBC 10.8* 12.2*  HGB 10.7* 10.2*  HCT 33.9* 32.4*  PLT 286 322   BMET  Recent Labs  05/29/13 0353 05/30/13 0430  NA 137 140  K 3.1* 5.1  CL 96 101  CO2 34* 34*  GLUCOSE 175* 127*  BUN 14 15  CREATININE 0.63 0.59  CALCIUM 10.3 10.0   PT/INR  Recent Labs  05/29/13 0353 05/30/13 0430  LABPROT 15.2 14.8  INR 1.23 1.19   CMP     Component Value Date/Time   NA 140 05/30/2013 0430   NA 141 03/08/2013 1221   K 5.1 05/30/2013 0430   CL 101 05/30/2013 0430   CO2 34* 05/30/2013 0430   GLUCOSE 127* 05/30/2013 0430   GLUCOSE 160* 03/08/2013 1221   BUN 15 05/30/2013 0430   BUN 20 03/08/2013 1221   CREATININE 0.59 05/30/2013 0430   CREATININE 0.96 12/05/2012 1251   CALCIUM 10.0 05/30/2013 0430   PROT 6.6 05/24/2013 0525   PROT 7.1 03/08/2013 1221   ALBUMIN 2.6* 05/24/2013 0525   AST 10 05/24/2013 0525   ALT 5 05/24/2013 0525   ALKPHOS 81 05/24/2013 0525   BILITOT 0.5 05/24/2013 0525   GFRNONAA 84* 05/30/2013 0430   GFRAA >90 05/30/2013 0430   Lipase     Component Value Date/Time   LIPASE 32  08/12/2012 0948       Studies/Results: Dg Chest Port 1 View  05/29/2013   CLINICAL DATA:  Confirm line placement  EXAM: PORTABLE CHEST - 1 VIEW  COMPARISON:  05/25/2013  FINDINGS: There is a right arm PICC line with tip 6.2 cm below the carina. There is a left chest wall pacer device with leads in the right atrial appendage right ventricle. Lung volumes remain low. Small left pleural effusion is identified. There is atelectasis noted in the left base.  IMPRESSION: 1. Right arm PICC line tip is in the right atrium. Recommend withdrawing by 1.5 cm. 2. Left pleural effusion with left base atelectasis.   Electronically Signed   By: Signa Kell M.D.   On: 05/29/2013 17:57    Anti-infectives: Anti-infectives   Start     Dose/Rate Route Frequency Ordered Stop   05/29/13 2200  Ampicillin-Sulbactam (UNASYN) 3 g in sodium chloride 0.9 % 100 mL IVPB    Comments:  Switch from Invanz to unasyn   3 g 100 mL/hr over 60 Minutes Intravenous Every 6 hours 05/29/13 1943     05/29/13 1800  Ampicillin-Sulbactam (UNASYN) 3 g in sodium chloride 0.9 % 100 mL IVPB  Status:  Discontinued    Comments:  Switch from Invanz to unasyn   3 g 100 mL/hr over 60 Minutes Intravenous Every 6 hours 05/29/13 1630 05/29/13 1943   05/29/13 1030  Ampicillin-Sulbactam (UNASYN) 3 g in sodium chloride 0.9 % 100 mL IVPB  Status:  Discontinued    Comments:  Switch from Invanz to unasyn   3 g 100 mL/hr over 60 Minutes Intravenous Every 6 hours 05/29/13 0926 05/29/13 1630   05/25/13 1130  ertapenem (INVANZ) 1 g in sodium chloride 0.9 % 50 mL IVPB  Status:  Discontinued     1 g 100 mL/hr over 30 Minutes Intravenous Every 24 hours 05/25/13 1126 05/29/13 0926   05/23/13 2200  metroNIDAZOLE (FLAGYL) IVPB 500 mg  Status:  Discontinued     500 mg 100 mL/hr over 60 Minutes Intravenous Every 8 hours 05/23/13 2140 05/25/13 1126   05/23/13 2145  ciprofloxacin (CIPRO) IVPB 400 mg  Status:  Discontinued     400 mg 200 mL/hr over 60 Minutes  Intravenous Every 12 hours 05/23/13 2140 05/25/13 1126       Assessment/Plan Acute perforated diverticulitis colon  Hypokalemia  HTN  H/o afib  Chronic DHF  DM  ILD  Anxiety/delerium   Plan:  1. Improving overall on abx, WBC up again (12.2 was 10.8 yesterday)  2. IVF, pain control, antibiotics  3. Called ID regarding length of abx tx and PO abx to be given at D/C, will switch Invanz 4/4 to Unasyn which will help [place] to determine if we can d/c her on Augmentin. Monitor WBC. Will treat as OP with abx for a total of 10-14 days. Given increase WBC today, watch for one additional day and if she does not improve put her back on Invanz and will likely need IV antibiotics upon discharge. 4. Does not appear to need surgical intervention at this time. She is tolerating a full liquid diet and can be switched to low residue diet as tolerated  5. Orals for pain 6. F/u with Dr. Derrell Lolling in 2-3 weeks, will need OP colonoscopy, last one in 2011 - 6 weeks from now     LOS: 7 days    DORT, [facility] 05/30/2013, 9:25 AM Pager: [phone]

## 2013-05-30 NOTE — Progress Notes (Signed)
Dr. Jon Billings called in regards to order parameters set for sotalol administration. Ordered an EKG to determine QT/QTc. EKG obtained. Dr. Jon Billings notified of results. Ordered to administer sotalol dose. No other orders obtained. Will continue to monitor.   Rochele Pages, RN

## 2013-05-30 NOTE — Progress Notes (Addendum)
Physician notified: Buriev  At: Lia.Robert  Regarding: PT. eval completed, R weakness new. PT called [facility], confirmed new weakness. Possible CVA? No head CT on this admission. Awaiting return response.   Returned Response at: 0855  Order(s): Will place order for CT head.

## 2013-05-30 NOTE — Progress Notes (Addendum)
[facility] PROGRESS NOTE  Alexandria [name] ZOX:[id number] DOB: 08-27-1929 DOA: 05/23/2013 PCP: [name], MD  Assessment/Plan: Alexandria [name] is an 78 y.o. female with hx of afib on chronic anticoagulation, hx of B12 deficiency, essential tremor, ILD, hyperlipidemia, DM, s/p PPM who presented to [facility] with abdominal pain. She was found to have a perforated diverticulitis by CT and subsequently transferred to [facility] per patient and family's request. Patient hospital course also complicated by A fib RVR and Heart failure exacerbation. She is still on Cardizem Gtt. Cardiologist has been helping with management. For perforated diverticulitis she has been getting IV antibiotics. Initially IV ciprofloxacin and Flagyl, this was change to [facility].   1. Perforated Diverticulitis: Patient received 4 days of IV INVANZ. tolerating full liquid diet. INVAZ change to Unasyn to determine if patient will be able to be discharge on Augment at that time. Improving clinically.   2. Hypertension: Hold Norvasc, Imdur. Stable; resume upon d/c   3. History of A fib: Continue with Cardizem Gtt. Was Holding coumadin in case patient required surgery intervention. INR corrected with FFP 11-28 and vitamin K. INR at 1.2.  -Started on Sotalol. 12-2. resume coumadin, IV heparin per cardiology   4. Acute on Chronic diastolic Heart Failure; Cardiology following. Monitor for pulmonary edema. Started on oral lasix.  -CAD s/p PTCA; cardiology follownig  5. Diabetes: SSI. Hold long acting insulin due to NPO status.   6. ILD: stable.   7. Hypokalemia: replete with 40 meq times 2.   8. Anxiety/ delirium: ativan PRN. Improved.   9. ? R sided weakness noticed by PT; likely related to recent L-spine vertebral compression fracture; patient denies unilateral weakness; exam is non focal; CT head: no acute findings;  -obtain, head CTA; restart anticoagulation;   Code Status: full Family Communication: none at  The  bedside (indicate person spoken with, relationship, and if by phone, the number) Disposition Plan: home when ready  Consultants:  Surgery Procedures:  none Antibiotics:  Ciprofloxacin 11-26---11-27  Flagyl 11-26----11-27  Invanz 11-27---12-02  Unasyn 12-02   HPI/Subjective: alert  Objective: Filed Vitals:   05/30/13 1029  BP: 106/66  Pulse: 85  Temp: 97.5 F (36.4 C)  Resp: 17    Intake/Output Summary (Last 24 hours) at 05/30/13 1302 Last data filed at 05/30/13 1030  Gross per 24 hour  Intake    295 ml  Output   1050 ml  Net   -755 ml   Filed Weights   05/23/13 1915 05/24/13 0400  Weight: 81.5 kg (179 lb 10.8 oz) 82.4 kg (181 lb 10.5 oz)    Exam:   General:  alert  Cardiovascular: s1,s2 rrr  Respiratory: few crackles in ll  Abdomen: soft, nt, nd  Musculoskeletal: no edema    Data Reviewed: Basic Metabolic Panel:  Recent Labs Lab 05/25/13 0430 05/26/13 0457 05/27/13 0415 05/28/13 0945 05/29/13 0353 05/30/13 0430  NA 139 137 140 136 137 140  K 3.3* 3.8 3.6 3.6 3.1* 5.1  CL 98 96 97 96 96 101  CO2 30 28 31 30  34* 34*  GLUCOSE 154* 208* 215* 329* 175* 127*  BUN 10 8 11 13 14 15   CREATININE 0.74 0.62 0.59 0.57 0.63 0.59  CALCIUM 9.8 9.5 10.3 10.2 10.3 10.0  MG 1.4*  --  1.6  --   --   --    Liver Function Tests:  Recent Labs Lab 05/24/13 0525  AST 10  ALT 5  ALKPHOS 81  BILITOT 0.5  PROT 6.6  ALBUMIN 2.6*   No results found for this basename: LIPASE, AMYLASE,  in the last 168 hours No results found for this basename: AMMONIA,  in the last 168 hours CBC:  Recent Labs Lab 05/26/13 0457 05/27/13 0415 05/28/13 0425 05/29/13 0353 05/30/13 0430  WBC 11.4* 15.1* 9.9 10.8* 12.2*  HGB 11.1* 10.4* 10.4* 10.7* 10.2*  HCT 34.2* 31.8* 31.7* 33.9* 32.4*  MCV 88.1 87.8 88.1 89.2 89.5  PLT 191 221 237 286 322   Cardiac Enzymes: No results found for this basename: CKTOTAL, CKMB, CKMBINDEX, TROPONINI,  in the last 168 hours BNP (last 3  results)  Recent Labs  06/24/12 1600 08/12/12 0948 05/25/13 1040  PROBNP 1253.0* 301.2 1061.0*   CBG:  Recent Labs Lab 05/29/13 1953 05/29/13 2342 05/30/13 0346 05/30/13 0849 05/30/13 1109  GLUCAP 357* 197* 130* 168* 277*    Recent Results (from the past 240 hour(s))  MRSA PCR SCREENING     Status: Abnormal   Collection Time    05/23/13 11:50 PM      Result Value Range Status   MRSA by PCR POSITIVE (*) NEGATIVE Final   Comment:            The GeneXpert MRSA Assay (FDA     approved for NASAL specimens     only), is one component of a     comprehensive MRSA colonization     surveillance program. It is not     intended to diagnose MRSA     infection nor to guide or     monitor treatment for     MRSA infections.     RESULT CALLED TO, READ BACK BY AND VERIFIED WITH:     CALLED TO RN MELISSA HANCOCK [phone] @[phone]  [name]     Studies: Ct Head Wo Contrast  05/30/2013   CLINICAL DATA:  Right-sided weakness. Hypertension and hyperlipidemia. Atrial fibrillation. Diabetic.  EXAM: CT HEAD WITHOUT CONTRAST  TECHNIQUE: Contiguous axial images were obtained from the base of the skull through the vertex without intravenous contrast.  COMPARISON:  04/05/2013 CT.  FINDINGS: No intracranial hemorrhage.  Prominent white matter type changes without CT evidence of large acute infarct. Small acute infarct would be difficult to exclude given the degree of white matter type changes.  Global atrophy without hydrocephalus.  Vascular calcifications.  No intracranial mass lesion noted on this unenhanced exam.  IMPRESSION: No intracranial hemorrhage.  Prominent white matter type changes without CT evidence of large acute infarct. Small acute infarct would be difficult to exclude given the degree of white matter type changes.  Global atrophy without hydrocephalus.   Electronically Signed   By: Bridgett Larsson M.D.   On: 05/30/2013 12:15   Dg Chest Port 1 View  05/29/2013   CLINICAL DATA:  Confirm line  placement  EXAM: PORTABLE CHEST - 1 VIEW  COMPARISON:  05/25/2013  FINDINGS: There is a right arm PICC line with tip 6.2 cm below the carina. There is a left chest wall pacer device with leads in the right atrial appendage right ventricle. Lung volumes remain low. Small left pleural effusion is identified. There is atelectasis noted in the left base.  IMPRESSION: 1. Right arm PICC line tip is in the right atrium. Recommend withdrawing by 1.5 cm. 2. Left pleural effusion with left base atelectasis.   Electronically Signed   By: Signa Kell M.D.   On: 05/29/2013 17:57    Scheduled Meds: . ampicillin-sulbactam (UNASYN) IV  3 g Intravenous  Q6H  . antiseptic oral rinse  15 mL Mouth Rinse q12n4p  . azelastine  2 spray Each Nare BID  . calcitonin (salmon)  1 spray Alternating Nares Daily  . chlorhexidine  15 mL Mouth Rinse BID  . Chlorhexidine Gluconate Cloth  6 each Topical Q0600  . cyanocobalamin  1,000 mcg Intramuscular Q30 days  . diltiazem  30 mg Oral Q6H  . furosemide  40 mg Oral BID  . insulin aspart  0-15 Units Subcutaneous Q4H  . latanoprost  1 drop Both Eyes QHS  . levothyroxine  50 mcg Oral QAC breakfast  . lidocaine  1 patch Transdermal Q24H  . mupirocin ointment   Nasal BID  . sodium chloride  10-40 mL Intracatheter Q12H  . sodium chloride  3 mL Intravenous Q12H  . sotalol  80 mg Oral Q12H   Continuous Infusions:   Principal Problem:   Diverticulitis of colon (without mention of hemorrhage) Active Problems:   DIABETES MELLITUS   HYPERLIPIDEMIA   OBESITY   HYPERTENSION   Atrial fibrillation   Acute on chronic diastolic heart failure   Occlusion and stenosis of carotid artery without mention of cerebral infarction   Compression fracture of L1 lumbar vertebra   Cardiac pacemaker in situ   ILD (interstitial lung disease)   Atrial flutter    Time spent: >35 minutes     Esperanza Sheets  [facility] Pager [phone]. If 7PM-7AM, please contact night-coverage at  [email or web address], password [facility] 05/30/2013, 1:02 PM  LOS: 7 days

## 2013-05-30 NOTE — Progress Notes (Addendum)
ANTICOAGULATION CONSULT NOTE - Initial Consult  Pharmacy Consult for Heparin and Coumadin Indication: atrial fibrillation  Allergies  Allergen Reactions  . Prednisone Other (See Comments)    REACTION: "retained fluid"  . Ace Inhibitors Cough       . Crestor [Rosuvastatin Calcium] Other (See Comments)    Fatigue and leg pain   . Lipitor [Atorvastatin Calcium] Other (See Comments)    Myalgia and fatigue   . Niaspan [Niacin] Other (See Comments)    unknown  . Olmesartan Cough    Patient Measurements: Height: 5\' 3"  (160 cm) Weight: 181 lb 10.5 oz (82.4 kg) IBW/kg (Calculated) : 52.4 Heparin Dosing Weight: 61 kg  Vital Signs: Temp: 97.5 F (36.4 C) (12/03 1029) Temp src: Oral (12/03 1029) BP: 106/66 mmHg (12/03 1029) Pulse Rate: 85 (12/03 1029)  Labs:  Recent Labs  05/28/13 0425 05/28/13 0945 05/29/13 0353 05/30/13 0430  HGB 10.4*  --  10.7* 10.2*  HCT 31.7*  --  33.9* 32.4*  PLT 237  --  286 322  LABPROT 15.5*  --  15.2 14.8  INR 1.26  --  1.23 1.19  CREATININE  --  0.57 0.63 0.59    Estimated Creatinine Clearance: 56.1 ml/min (by C-G formula based on Cr of 0.59).   Medical History: Past Medical History  Diagnosis Date  . Hypertension     with h/o hypertensive urgency  . Glaucoma   . Tremor, essential   . Diabetes mellitus with neuropathy   . B12 deficiency   . Hyperlipidemia   . Atrial fibrillation     a. on chronic anticoagulation with warfarin  b. Tiksosyn discontinued and amiodarone  was discontinued secondary to perceived symptoms  . Chronic diastolic heart failure     a. echo 05/2009: mild LVH, EF 55-60%, grade 2 diast dysfxn, mild LAE, PASP;   b. Echo 3/13:   mod LVH, EF 55-60%, mild to mod calc AV annulus, very mild AS (mean 9 mmHg)  . CAD (coronary artery disease)     a.  b. 07/2011 NSTEMI - PCI/DES LCX - Resolute stent;;   b. LHC 12/27/11: mLAD 30%, prox-mid D1 90% (small);  CFX patent stent, OM3 small 95%,, mRCA 30%, EF 65%  -  med Rx;   .  Carotid stenosis     a. dopplers 6/12: 1-39% bilat ICA; b. carotid dopplers 7/13: bilat 1-39% ICA  . Pulmonary fibrosis     Dr. Vassie Loll;  patient taken off O2 in 8/12; dyspnea felt CHF>>ILD  . Skin cancer   . Colon polyps   . Syncope     unknown etiology  . Bradycardia   . Pacemaker 02/29/2012  . Diabetes mellitus     insulin controled  . Elevated TSH 05/2012  . Normocytic anemia 05/2012    Medications:  Facility-administered medications prior to admission  Medication Dose Route Frequency Provider Last Rate Last Dose  . cyanocobalamin ((VITAMIN B-12)) injection 1,000 mcg  1,000 mcg Intramuscular Q30 days Tammy Eckard, PHARMD   1,000 mcg at 03/08/13 1147   Prescriptions prior to admission  Medication Sig Dispense Refill  . acetaminophen (TYLENOL) 325 MG tablet Take 650 mg by mouth every 4 (four) hours as needed for mild pain or fever.      Marland Kitchen aluminum-magnesium hydroxide-simethicone (MAALOX) 200-200-20 MG/5ML SUSP Take 20 mLs by mouth 3 (three) times daily as needed (indigestion).      Marland Kitchen amLODipine (NORVASC) 5 MG tablet Take 5 mg by mouth daily.      Marland Kitchen  azelastine (ASTELIN) 137 MCG/SPRAY nasal spray Place 2 sprays into both nostrils 2 (two) times daily. Use in each nostril as directed      . bismuth subsalicylate (PEPTO BISMOL) 262 MG/15ML suspension Take 30 mLs by mouth as needed for diarrhea or loose stools.      . calcitonin, salmon, (MIACALCIN/FORTICAL) 200 UNIT/ACT nasal spray Place 1 spray into alternate nostrils daily.      Marland Kitchen dextrose 5 % SOLN 50 mL with cefTRIAXone 1 G SOLR 1 g Inject 1 g into the vein daily.      . furosemide (LASIX) 80 MG tablet Take 1 tablet (80 mg total) by mouth 2 (two) times daily.  60 tablet  10  . guaiFENesin (ROBITUSSIN) 100 MG/5ML SOLN Take 10 mLs by mouth every 6 (six) hours as needed for cough or to loosen phlegm.      Marland Kitchen HYDROcodone-acetaminophen (NORCO/VICODIN) 5-325 MG per tablet Take 1 tablet by mouth every 4 (four) hours as needed for moderate pain.       Marland Kitchen insulin aspart (NOVOLOG FLEXPEN) 100 UNIT/ML injection Inject 10 Units into the skin 3 (three) times daily before meals. Inject 10 units before breakfast and lunch and 8 units before supper  1 vial    . insulin glargine (LANTUS) 100 UNIT/ML injection Inject 8 Units into the skin at bedtime.      . isosorbide mononitrate (IMDUR) 60 MG 24 hr tablet Take 60 mg by mouth daily.      Marland Kitchen latanoprost (XALATAN) 0.005 % ophthalmic solution Place 1 drop into both eyes at bedtime.       Marland Kitchen levothyroxine (SYNTHROID, LEVOTHROID) 50 MCG tablet Take 50 mcg by mouth daily before breakfast.      . lidocaine (LIDODERM) 5 % Place 1 patch onto the skin daily. Left lumbar area; Remove & Discard patch within 12 hours or as directed by MD      . LORazepam (ATIVAN) 1 MG tablet Take 1 mg by mouth every 8 (eight) hours as needed for anxiety.      . metoprolol (LOPRESSOR) 50 MG tablet Take 50 mg by mouth 2 (two) times daily.      Marland Kitchen warfarin (COUMADIN) 4 MG tablet Take 4 mg by mouth daily.      . white petrolatum (VASELINE) GEL Apply 1 application topically as needed for dry skin.      Marland Kitchen senna (SENOKOT) 8.6 MG tablet Take 1 tablet by mouth 2 (two) times daily as needed for constipation.        Assessment: 79 y.o. female transferred from outside hospital with abd pain/perforated diverticulitis. Pt on coumadin PTA for afib (baseline INR therapeutic on home dose 4mg  daily). Coumadin was reversed (with 5mg  vit K IV and FFP x 2 on 11/28) and held in case pt needed surgery. INR down to 1.19 today. Pt continues in afib with RVR. To begin heparin to coumadin bridge. CBC stable. No bleeding noted.  Goal of Therapy:  Heparin level 0.3-0.7 units/ml; INR 2-3 Monitor platelets by anticoagulation protocol: Yes   Plan:  1. Heparin 2000 unit IV bolus 2. Heparin gtt at 850 units/hr 3. 8 hour heparin level 4. Daily heparin level, CBC, and INR 5. Coumadin 6 mg po today   Christoper Fabian, PharmD, BCPS Clinical pharmacist, pager  [phone] 05/30/2013,1:18 PM

## 2013-05-30 NOTE — Clinical Social Work Note (Signed)
FL2 on chart for MD signature.  Bryant Marabeth Melland, LCSWA, LCASA, [phone] 

## 2013-05-30 NOTE — Care Management Note (Signed)
    Page 1 of 2   05/31/2013     12:20:24 PM   CARE MANAGEMENT NOTE 05/31/2013  Patient:  Alexandria [name], Alexandria [name]   Account Number:  [id number]  Date Initiated:  05/25/2013  Documentation initiated by:  Donn Pierini  Subjective/Objective Assessment:   Pt admitted with diverticulitis     Action/Plan:   PTA pt lived at home- NCM to follow pt progression for d/c needs   Anticipated DC Date:  06/04/2013   Anticipated DC Plan:  IP REHAB FACILITY  In-house referral  Clinical Social Worker      DC Planning Services  CM consult      Choice offered to / List presented to:             Status of service:  In process, will continue to follow Medicare Important Message given?   (If response is "NO", the following Medicare IM given date fields will be blank) Date Medicare IM given:   Date Additional Medicare IM given:    Discharge Disposition:    Per UR Regulation:  Reviewed for med. necessity/level of care/duration of stay  If discussed at Long Length of Stay Meetings, dates discussed:   05/29/2013  05/31/2013    Comments:  05/31/13- 1215- Donn Pierini RN, BSN [phone] Made contact with [facility] INPT rehab (contact [phone]) regarding pt returning there when medically stable- per conversation with Alexandria [name] they are looking information over and will let us know if they can accept her back when medically stable to return. NCM to cont. to touch base with them as needed for updates.  05/30/13 1430- Donn Pierini RN, BSN [phone] Pt's brother Alexandria [name] at bedside- spoke again with pt while brother here- per conversation confirmed that pt was discharged to [facility] in Oct. and from [place] was tx to the inpt rehab at [facility] to cont. her rehab there to be closer to family. Both pt and brother would prefer pt to return to the rehab unit at [facility] (they personally know a physician there that assisted them in getting pt there the first time) CSW following for return to  rehab- NCM to cont. to follow. Brothers numbers in epic if needed.  05/29/13- 1200- Donn Pierini RN, BSN [phone] Spoke with pt at bedside regarding d/c plans- per conversation pt states that she lives at home but was in a rehab up in [place] prior to being admitted- states that she prefers to be near family and return to a rehab near [place]- per bedside RN pt was at a rehab in [place]- there are notes in pt's shadow chart from [facility]- and pt could have been in the rehab center there- will have CSW see pt to help assist with rehab vs SNF- per epic pt was discharged to [facility] on her last admission. Pt will need PT/OT evals when medically stable to participate.  05/28/13- 1050- Donn Pierini RN, BSn [phone] tolerating clears- per surgery may advance diet slowly- still on IV cardizem gtt

## 2013-05-30 NOTE — Progress Notes (Signed)
Agree with A&P of MD,PA. Patient denies abfd pain this afternoon and has a soft benign abdomen

## 2013-05-30 NOTE — Progress Notes (Signed)
Physician notified: [place] At: [phone]  Regarding: Pt. Brother in room if you need to talk to him.

## 2013-05-30 NOTE — Progress Notes (Signed)
Rehab Admissions Coordinator Note:  Patient was screened by Clois Dupes for appropriateness for an Inpatient Acute Rehab Consult.  Noted P.T. Recommend inpt rehab. At this time, we are recommending pt return to previous rehab that she was at pta. [phone].  Clois Dupes 05/30/2013, 1:44 PM  I can be reached at [phone].

## 2013-05-30 NOTE — Evaluation (Signed)
Physical Therapy Evaluation Patient Details Name: Alexandria [name] MRN: [id number] DOB: [birth year] Today's Date: 05/30/2013 Time: 4098-1191 PT Time Calculation (min): 58 min  PT Assessment / Plan / Recommendation History of Present Illness  Pt admitted for abdominal pain and was found to have diverticulitis and perforated bowel but no surgical intervention necessary. PT observation/assessment pt presented with R hand weakness co-ordination deficits.  Clinical Impression  Pt admitted with abdominal pain however pt presents with R hand co-ordination deficits and weakness. Suspect pt with vision deficits as well as pt had difficulty tracking. Per report from therapist at [facility] pt was ambulating indep with RW and was discharged from OT due to independence with all ADLs. Per nursing pt has had this weakness for a few days however pt did not have this PTA at rehab. Spoke with RN who called MD. CT of head ordered. Pt motivated and eager to return to rehab. Pt to benefit from CIR however will most likely request to return to [facility] since it's closer to home.    PT Assessment  Patient needs continued PT services    Follow Up Recommendations  CIR (however pt desires to return to [facility] for Rehab)    Does the patient have the potential to tolerate intense rehabilitation      Barriers to Discharge        Equipment Recommendations  None recommended by PT    Recommendations for Other Services Rehab consult   Frequency Min 4X/week    Precautions / Restrictions Precautions Precautions: Fall Restrictions Weight Bearing Restrictions: No   Pertinent Vitals/Pain Denies pain      Mobility  Bed Mobility Bed Mobility: Supine to Sit;Sitting - Scoot to Edge of Bed Supine to Sit: 3: Mod assist;With rails;HOB flat Sitting - Scoot to Edge of Bed: 4: Min assist Details for Bed Mobility Assistance: Assist for trunk elevation, pt with difficulty using R UE to assist with  transfer Transfers Transfers: Sit to Stand;Stand to Sit;Stand Pivot Transfers Sit to Stand: 1: +2 Total assist;With upper extremity assist;From bed;From chair/3-in-1 Sit to Stand: Patient Percentage: 50% Stand to Sit: 1: +2 Total assist;With upper extremity assist;To chair/3-in-1 Stand to Sit: Patient Percentage: 50% Stand Pivot Transfers: 1: +2 Total assist (used RW) Stand Pivot Transfers: Patient Percentage: 60% Details for Transfer Assistance: max assist for anterior weight shift to obtain upright position. Pt with strong L lateral lean. assist at hips to achieve extension. modA for walker management to complete stand pvt transfer Ambulation/Gait Ambulation/Gait Assistance: 1: +2 Total assist Ambulation/Gait: Patient Percentage: 60% Ambulation Distance (Feet): 2 Feet Assistive device: Rolling walker Ambulation/Gait Assistance Details: pt with difficulty with holding R UE on walker. Pt with + SOB, HR in 80, SpO2 at 100% on 4 LO2 via [place]. Pt with difficulty advancing LEs Gait Pattern: Step-to pattern;Decreased stride length;Decreased weight shift to right;Decreased weight shift to left;Lateral trunk lean to left Gait velocity: slow General Gait Details: unable to tolerate due to fatigue Stairs: No    Exercises     PT Diagnosis: Difficulty walking  PT Problem List: Decreased strength;Decreased activity tolerance;Decreased balance;Decreased mobility;Decreased coordination PT Treatment Interventions: DME instruction;Gait training;Functional mobility training;Therapeutic activities;Therapeutic exercise;Neuromuscular re-education     PT Goals(Current goals can be found in the care plan section) Acute Rehab PT Goals Patient Stated Goal: [facility] "I was doing so good" PT Goal Formulation: With patient Time For Goal Achievement: 06/13/13 Potential to Achieve Goals: Good  Visit Information  Last PT Received On: 05/30/13  Assistance Needed: +2 History of Present Illness: Pt  admitted for abdominal pain and was found to have diverticulitis and perforated bowel but no surgical intervention necessary. PT observation/assessment pt presented with R hand weakness co-ordination deficits.       Prior Functioning  Home Living Family/patient expects to be discharged to:: Skilled nursing facility Living Arrangements: Alone Additional Comments: pt was at [facility] for rehab since october secondary to fall. Prior Function Comments: Spoke with Kennedy Bucker, PT at [facility] and he reports they were getting ready to d/c her 2 days PTA as pt was walking independently with RW and was indep with dressing/bathing and feeding self. PT reports on 11/24 pt was doing 15 min on Nu Step. Communication Communication: No difficulties Dominant Hand: Right    Cognition  Cognition Arousal/Alertness: Awake/alert (however pt reports being sleepy) Behavior During Therapy: WFL for tasks assessed/performed Overall Cognitive Status: Within Functional Limits for tasks assessed    Extremity/Trunk Assessment Upper Extremity Assessment Upper Extremity Assessment: RUE deficits/detail RUE Deficits / Details: pt with noted R hand co-ordination deficts and weakness. Pt unable to feed self or complete finger to nose test accurate. Pt with R UE drift when asked to raise arms. Lower Extremity Assessment Lower Extremity Assessment: Generalized weakness Cervical / Trunk Assessment Cervical / Trunk Assessment: Normal   Balance Balance Balance Assessed: Yes Static Sitting Balance Static Sitting - Balance Support: Bilateral upper extremity supported;Feet supported Static Sitting - Level of Assistance: 3: Mod assist Static Sitting - Comment/# of Minutes: pt with strong L lateral lean. with onset of fatigue pt with posterior lean as well requiring modA to maintain upright/midline position  End of Session PT - End of Session Equipment Utilized During Treatment: Gait belt Activity Tolerance: Patient  limited by fatigue Patient left: in chair;with nursing/sitter in room;with call bell/phone within reach Nurse Communication: Mobility status (new R UE wkns and co-ordination deficits)  GP     Marcene Brawn 05/30/2013, 9:16 AM  Lewis Shock, PT, DPT Pager #: [phone] Office #: [phone]

## 2013-05-31 ENCOUNTER — Inpatient Hospital Stay (HOSPITAL_COMMUNITY): Payer: Medicare Other

## 2013-05-31 LAB — HEPARIN LEVEL (UNFRACTIONATED)
Heparin Unfractionated: <0.1 IU/mL — ABNORMAL LOW (ref 0.30–0.70)
Heparin Unfractionated: 0.31 IU/mL (ref 0.30–0.70)

## 2013-05-31 LAB — GLUCOSE, CAPILLARY
Glucose-Capillary: 129 mg/dL — ABNORMAL HIGH (ref 70–99)
Glucose-Capillary: 130 mg/dL — ABNORMAL HIGH (ref 70–99)
Glucose-Capillary: 138 mg/dL — ABNORMAL HIGH (ref 70–99)
Glucose-Capillary: 248 mg/dL — ABNORMAL HIGH (ref 70–99)

## 2013-05-31 LAB — CBC
HCT: 32.3 % — ABNORMAL LOW (ref 36.0–46.0)
Hemoglobin: 10.2 g/dL — ABNORMAL LOW (ref 12.0–15.0)
MCH: 28.1 pg (ref 26.0–34.0)
MCV: 89 fL (ref 78.0–100.0)
RBC: 3.63 MIL/uL — ABNORMAL LOW (ref 3.87–5.11)
RDW: 13.7 % (ref 11.5–15.5)
WBC: 15.6 10*3/uL — ABNORMAL HIGH (ref 4.0–10.5)

## 2013-05-31 MED ORDER — IOHEXOL 300 MG/ML  SOLN
80.0000 mL | Freq: Once | INTRAMUSCULAR | Status: AC | PRN
  Administered 2013-05-31: 80 mL via INTRAVENOUS

## 2013-05-31 MED ORDER — DEXTROSE 50 % IV SOLN
INTRAVENOUS | Status: AC
  Administered 2013-05-31: 25 mL
  Filled 2013-05-31: qty 50

## 2013-05-31 MED ORDER — INSULIN ASPART 100 UNIT/ML ~~LOC~~ SOLN
0.0000 [IU] | SUBCUTANEOUS | Status: DC
  Administered 2013-05-31: 3 [IU] via SUBCUTANEOUS
  Administered 2013-06-01: 2 [IU] via SUBCUTANEOUS
  Administered 2013-06-01: 1 [IU] via SUBCUTANEOUS
  Administered 2013-06-01: 5 [IU] via SUBCUTANEOUS
  Administered 2013-06-01 (×2): 3 [IU] via SUBCUTANEOUS
  Administered 2013-06-01: 9 [IU] via SUBCUTANEOUS
  Administered 2013-06-02 (×3): 3 [IU] via SUBCUTANEOUS

## 2013-05-31 MED ORDER — DEXTROSE 50 % IV SOLN
25.0000 mL | Freq: Once | INTRAVENOUS | Status: AC | PRN
Start: 2013-05-31 — End: 2013-05-31

## 2013-05-31 MED ORDER — DEXTROSE-NACL 5-0.9 % IV SOLN
INTRAVENOUS | Status: DC
  Administered 2013-05-31: 08:00:00 via INTRAVENOUS
  Administered 2013-06-01: 1000 mL via INTRAVENOUS
  Administered 2013-06-02: 01:00:00 via INTRAVENOUS

## 2013-05-31 MED ORDER — IOHEXOL 300 MG/ML  SOLN
25.0000 mL | INTRAMUSCULAR | Status: AC
  Administered 2013-05-31: 25 mL via ORAL

## 2013-05-31 MED ORDER — SODIUM CHLORIDE 0.9 % IV SOLN
1.0000 g | INTRAVENOUS | Status: DC
  Administered 2013-05-31 – 2013-06-03 (×4): 1 g via INTRAVENOUS
  Filled 2013-05-31 (×5): qty 1

## 2013-05-31 MED ORDER — INSULIN ASPART 100 UNIT/ML ~~LOC~~ SOLN: 0.0000 [IU] | Freq: Every day | SUBCUTANEOUS | Status: DC

## 2013-05-31 MED ORDER — INSULIN ASPART 100 UNIT/ML ~~LOC~~ SOLN: 4.0000 [IU] | Freq: Three times a day (TID) | SUBCUTANEOUS | Status: DC

## 2013-05-31 MED ORDER — HEPARIN BOLUS VIA INFUSION
2000.0000 [IU] | Freq: Once | INTRAVENOUS | Status: AC
  Administered 2013-05-31: 2000 [IU] via INTRAVENOUS
  Filled 2013-05-31: qty 2000

## 2013-05-31 MED ORDER — INSULIN ASPART 100 UNIT/ML ~~LOC~~ SOLN
0.0000 [IU] | Freq: Three times a day (TID) | SUBCUTANEOUS | Status: DC
  Administered 2013-05-31: 2 [IU] via SUBCUTANEOUS

## 2013-05-31 MED ORDER — HEPARIN (PORCINE) IN NACL 100-0.45 UNIT/ML-% IJ SOLN
1500.0000 [IU]/h | INTRAMUSCULAR | Status: DC
  Administered 2013-06-01: 1500 [IU]/h via INTRAVENOUS
  Filled 2013-05-31 (×2): qty 250

## 2013-05-31 MED ORDER — INSULIN ASPART 100 UNIT/ML ~~LOC~~ SOLN: 0.0000 [IU] | Freq: Three times a day (TID) | SUBCUTANEOUS | Status: DC

## 2013-05-31 NOTE — Progress Notes (Signed)
ANTICOAGULATION CONSULT NOTE  Pharmacy Consult for heparin Indication: atrial fibrillation  Allergies  Allergen Reactions  . Prednisone Other (See Comments)    REACTION: "retained fluid"  . Ace Inhibitors Cough       . Crestor [Rosuvastatin Calcium] Other (See Comments)    Fatigue and leg pain   . Lipitor [Atorvastatin Calcium] Other (See Comments)    Myalgia and fatigue   . Niaspan [Niacin] Other (See Comments)    unknown  . Olmesartan Cough    Patient Measurements: Height: 5\' 3"  (160 cm) Weight: 181 lb 7 oz (82.3 kg) IBW/kg (Calculated) : 52.4 Heparin Dosing Weight: 70 kg  Vital Signs: Temp: 98.3 F (36.8 C) (12/04 0400) Temp src: Oral (12/04 0400) BP: 159/61 mmHg (12/04 0400) Pulse Rate: 77 (12/04 0400)  Labs:  Recent Labs  05/28/13 0945  05/29/13 0353 05/30/13 0430 05/30/13 2150 05/31/13 0425 05/31/13 0544  HGB  --   < > 10.7* 10.2*  --  10.2*  --   HCT  --   --  33.9* 32.4*  --  32.3*  --   PLT  --   --  286 322  --  294  --   LABPROT  --   --  15.2 14.8  --   --   --   INR  --   --  1.23 1.19  --   --   --   HEPARINUNFRC  --   --   --   --  <0.10*  --  <0.10*  CREATININE 0.57  --  0.63 0.59  --   --   --   < > = values in this interval not displayed.  Estimated Creatinine Clearance: 56.1 ml/min (by C-G formula based on Cr of 0.59).  Assessment: 79 yo female with h/o Afib, Coumadin on hold, for heparin  Goal of Therapy:  Heparin level 0.3-0.7 units/ml Monitor platelets by anticoagulation protocol: Yes   Plan:  Heparin 2000 units IV bolus, then increase heparin 1250 units/hr Check heparin level in 6 hours.  Geannie Risen, PharmD, BCPS  05/31/2013 6:55 AM

## 2013-05-31 NOTE — Progress Notes (Signed)
Patient ID: Alexandria [name], female   DOB: 10-05-1929, 79 y.o.   MRN: [id number] Having very little abdominal pain. AF VSS. CT A/P reviewed with Dr. Jamey Ripa. It appears similar to previous scan report. Some small dots of free air near colon and a few along anterior abdominal wall. Minimal tenderness on exam. Continue IV ABX. Violeta Gelinas, MD, MPH, FACS Pager: [phone]

## 2013-05-31 NOTE — Progress Notes (Signed)
Utilization review completed.  

## 2013-05-31 NOTE — Progress Notes (Signed)
ANTICOAGULATION CONSULT NOTE  Pharmacy Consult for heparin Indication: atrial fibrillation  Allergies  Allergen Reactions  . Prednisone Other (See Comments)    REACTION: "retained fluid"  . Ace Inhibitors Cough       . Crestor [Rosuvastatin Calcium] Other (See Comments)    Fatigue and leg pain   . Lipitor [Atorvastatin Calcium] Other (See Comments)    Myalgia and fatigue   . Niaspan [Niacin] Other (See Comments)    unknown  . Olmesartan Cough    Patient Measurements: Height: 5\' 3"  (160 cm) Weight: 181 lb 7 oz (82.3 kg) IBW/kg (Calculated) : 52.4 Heparin Dosing Weight: 70 kg  Vital Signs: Temp: 98.1 F (36.7 C) (12/04 1100) Temp src: Oral (12/04 1100) BP: 138/55 mmHg (12/04 1100) Pulse Rate: 77 (12/04 1100)  Labs:  Recent Labs  05/29/13 0353 05/30/13 0430 05/30/13 2150 05/31/13 0425 05/31/13 0544 05/31/13 1400  HGB 10.7* 10.2*  --  10.2*  --   --   HCT 33.9* 32.4*  --  32.3*  --   --   PLT 286 322  --  294  --   --   LABPROT 15.2 14.8  --   --  17.2*  --   INR 1.23 1.19  --   --  1.44  --   HEPARINUNFRC  --   --  <0.10*  --  <0.10* <0.10*  CREATININE 0.63 0.59  --   --   --   --     Estimated Creatinine Clearance: 56.1 ml/min (by C-G formula based on Cr of 0.59).  Assessment: 79 yo female with h/o Afib, Coumadin on hold. Heparin still subtherapeutic. Rn said heparin has been infusing fine. Level has been <0.1 for the third time now. Will try to increase dose by a couple hundred units/hr and recheck level  Goal of Therapy:  Heparin level 0.3-0.7 units/ml Monitor platelets by anticoagulation protocol: Yes   Plan:  Heparin 2000 units IV bolus, then increase heparin 1500 units/hr Check 6hr heparin level

## 2013-05-31 NOTE — Progress Notes (Signed)
Notified md (Dr. Janee Morn) of ct scan results.

## 2013-05-31 NOTE — Progress Notes (Signed)
SUBJECTIVE:  She denies any pain.  She has no appetite and seems to be more fatigued.  She is very weak. She does not want to get up or work with PT.    PHYSICAL EXAM Filed Vitals:   05/30/13 2016 05/31/13 0000 05/31/13 0400 05/31/13 0754  BP: 138/58 134/51 159/61 139/58  Pulse: 89 72 77 87  Temp: 97.9 F (36.6 C) 98.1 F (36.7 C) 98.3 F (36.8 C) 98.2 F (36.8 C)  TempSrc: Oral Oral Oral Oral  Resp: 35 22 16 20   Height:      Weight:   181 lb 7 oz (82.3 kg)   SpO2: 97% 98% 97% 94%   General:  No distress Lungs:  Dry crackles Heart:  Irregular Abdomen:  Positive bowel sounds decreased infrequency, no rebound no guarding, mild deep tenderness Extremities:  No edema  LABS:  Results for orders placed during the hospital encounter of 05/23/13 (from the past 24 hour(s))  GLUCOSE, CAPILLARY     Status: Abnormal   Collection Time    05/30/13  8:49 AM      Result Value Range   Glucose-Capillary 168 (*) 70 - 99 mg/dL  GLUCOSE, CAPILLARY     Status: Abnormal   Collection Time    05/30/13 11:09 AM      Result Value Range   Glucose-Capillary 277 (*) 70 - 99 mg/dL  GLUCOSE, CAPILLARY     Status: Abnormal   Collection Time    05/30/13  3:52 PM      Result Value Range   Glucose-Capillary 307 (*) 70 - 99 mg/dL   Comment 1 Notify RN     Comment 2 Documented in Chart    GLUCOSE, CAPILLARY     Status: Abnormal   Collection Time    05/30/13  7:55 PM      Result Value Range   Glucose-Capillary 325 (*) 70 - 99 mg/dL   Comment 1 Notify RN     Comment 2 Documented in Chart    HEPARIN LEVEL (UNFRACTIONATED)     Status: Abnormal   Collection Time    05/30/13  9:50 PM      Result Value Range   Heparin Unfractionated <0.10 (*) 0.30 - 0.70 IU/mL  GLUCOSE, CAPILLARY     Status: Abnormal   Collection Time    05/30/13 11:35 PM      Result Value Range   Glucose-Capillary 138 (*) 70 - 99 mg/dL   Comment 1 Notify RN     Comment 2 Documented in Chart    GLUCOSE, CAPILLARY     Status:  Abnormal   Collection Time    05/31/13  3:48 AM      Result Value Range   Glucose-Capillary 61 (*) 70 - 99 mg/dL   Comment 1 Notify RN     Comment 2 Documented in Chart    CBC     Status: Abnormal   Collection Time    05/31/13  4:25 AM      Result Value Range   WBC 15.6 (*) 4.0 - 10.5 K/uL   RBC 3.63 (*) 3.87 - 5.11 MIL/uL   Hemoglobin 10.2 (*) 12.0 - 15.0 g/dL   HCT 16.1 (*) 09.6 - 04.5 %   MCV 89.0  78.0 - 100.0 fL   MCH 28.1  26.0 - 34.0 pg   MCHC 31.6  30.0 - 36.0 g/dL   RDW 40.9  81.1 - 91.4 %   Platelets 294  150 -  400 K/uL  GLUCOSE, CAPILLARY     Status: Abnormal   Collection Time    05/31/13  4:47 AM      Result Value Range   Glucose-Capillary 129 (*) 70 - 99 mg/dL   Comment 1 Notify RN     Comment 2 Documented in Chart    PROTIME-INR     Status: Abnormal   Collection Time    05/31/13  5:44 AM      Result Value Range   Prothrombin Time 17.2 (*) 11.6 - 15.2 seconds   INR 1.44  0.00 - 1.49  HEPARIN LEVEL (UNFRACTIONATED)     Status: Abnormal   Collection Time    05/31/13  5:44 AM      Result Value Range   Heparin Unfractionated <0.10 (*) 0.30 - 0.70 IU/mL  GLUCOSE, CAPILLARY     Status: Abnormal   Collection Time    05/31/13  8:08 AM      Result Value Range   Glucose-Capillary 130 (*) 70 - 99 mg/dL    Intake/Output Summary (Last 24 hours) at 05/31/13 0840 Last data filed at 05/31/13 0415  Gross per 24 hour  Intake    534 ml  Output   1750 ml  Net  -1216 ml     ASSESSMENT AND PLAN:  ATRIAL FIB:   On Sotalol and heparin.  Now in NSR.  Continue PO Cardizem.  Start warfarin when OK with primary team/surgery.   Repeat EKG in sinus.   ACUTE ON CHRONIC DIASTOLIC HF:   She seems to be euvolemic.  Check BMET in the AM.      [name] 05/31/2013 8:40 AM

## 2013-05-31 NOTE — Progress Notes (Signed)
Agree with A&P of KO,PA. Her abdomen is soft, perhaps minimally tender. KUB shows retained Barium.  Restarttin INvanz as she improved on that and wbc started to increase with change to Unasyn

## 2013-05-31 NOTE — Progress Notes (Signed)
Hypoglycemic Event  CBG: 61  Treatment: D50 IV 25 mL  Symptoms: Nervous/irritable  Follow-up CBG: Time:0447 CBG Result:129  Possible Reasons for Event: Medication regimen: sliding scale insulin  Comments/MD notified:awaiting return page    Elon Alas, Cassie Freer  Remember to initiate Hypoglycemia Order Set & complete

## 2013-05-31 NOTE — Progress Notes (Signed)
Paged dr on call, waiting for call back

## 2013-05-31 NOTE — Progress Notes (Signed)
PT Cancellation Note  Patient Details Name: TREONNA KLEE MRN: [id number] DOB: 1930-03-05   Cancelled Treatment:    Reason Eval/Treat Not Completed: Patient at procedure or test/unavailable (Attempted).  Pt going off floor.  Will attempt to see later if time allows.   Elizabelle Fite 05/31/2013, 3:33 PM  [name], PT DPT [phone]

## 2013-05-31 NOTE — Progress Notes (Signed)
ANTICOAGULATION CONSULT NOTE - Follow Up Consult  Pharmacy Consult for heparin Indication: atrial fibrillation   Labs:  Recent Labs  05/29/13 0353 05/30/13 0430  05/31/13 0425 05/31/13 0544 05/31/13 1400 05/31/13 2215  HGB 10.7* 10.2*  --  10.2*  --   --   --   HCT 33.9* 32.4*  --  32.3*  --   --   --   PLT 286 322  --  294  --   --   --   LABPROT 15.2 14.8  --   --  17.2*  --   --   INR 1.23 1.19  --   --  1.44  --   --   HEPARINUNFRC  --   --   < >  --  <0.10* <0.10* 0.31  CREATININE 0.63 0.59  --   --   --   --   --   < > = values in this interval not displayed.   Assessment/Plan:  79yo female now therapeutic on heparin at low end of range.  Given age will continue gtt at current rate and confirm with am labs.  Vernard Gambles, PharmD, BCPS  05/31/2013,11:18 PM

## 2013-05-31 NOTE — Progress Notes (Signed)
[facility] PROGRESS NOTE  Alexandria [name] RUE:[id number] DOB: 28-Jun-1930 DOA: 05/23/2013 PCP: [name], MD  Assessment/Plan: Alexandria [name] is an 79 y.o. female with hx of afib on chronic anticoagulation, hx of B12 deficiency, essential tremor, ILD, hyperlipidemia, DM, s/p PPM who presented to [facility] hospital with abdominal pain. She was found to have a perforated diverticulitis by CT and subsequently transferred to [facility] per patient and family's request. Patient hospital course also complicated by A fib RVR and Heart failure exacerbation. She is still on Cardizem Gtt.  1. Perforated Diverticulitis: was on IV INVANZ, changed to unasyn;  -clinically worse 12/4; leukocytosis; will repeat KUB, cont IV atx; gentle IVF while NPO; appreciate surgery input; may need CT to eval for possible drainage vs surgery  2. Hypertension: Hold Norvasc, Imdur. , prn hydralazine   3. A fib s/p PPM: IV--> PO Cardizem; Holding coumadin in case patient required surgery intervention. Started on Sotalol. 12-2. Start IV heparin, hold coumadin if need surgical intervention   4. Acute on Chronic diastolic Heart Failure; echo (1478): LVEF 55%, LVH -CXR (12/2): mild edema, L effusion; Started on oral lasix. I/O neg 3.4L; may need gentle IVF while npo -CAD s/p PTCA in th epast; cardiology following   5. Diabetes: episode of hypoglycemia, close monitor while NPO: SSI. Hold long acting insulin   6. ILD: stable.   7. Anxiety/ delirium: ativan PRN. Improved.   8. Recent L-spine vertebral compression fracture; neuro exam is non focal; CT head: no acute findings;  -cont pain control;   Code Status: full Family Communication: d/w her brother at the bedside (indicate person spoken with, relationship, and if by phone, the number) Disposition Plan: pend clinical improvement; SNF   Consultants:  Surgery Procedures:  none Antibiotics:  Ciprofloxacin 11-26---11-27  Flagyl 11-26----11-27  Invanz 11-27---12-02   Unasyn 12-02   HPI/Subjective: alert  Objective: Filed Vitals:   05/31/13 0754  BP: 139/58  Pulse: 87  Temp: 98.2 F (36.8 C)  Resp: 20    Intake/Output Summary (Last 24 hours) at 05/31/13 0809 Last data filed at 05/31/13 0415  Gross per 24 hour  Intake    534 ml  Output   1750 ml  Net  -1216 ml   Filed Weights   05/23/13 1915 05/24/13 0400 05/31/13 0400  Weight: 81.5 kg (179 lb 10.8 oz) 82.4 kg (181 lb 10.5 oz) 82.3 kg (181 lb 7 oz)    Exam:   General:  alert  Cardiovascular: s1,s2 rrr  Respiratory: few crackles in ll  Abdomen: soft, nt, nd  Musculoskeletal: no edema    Data Reviewed: Basic Metabolic Panel:  Recent Labs Lab 05/25/13 0430 05/26/13 0457 05/27/13 0415 05/28/13 0945 05/29/13 0353 05/30/13 0430  NA 139 137 140 136 137 140  K 3.3* 3.8 3.6 3.6 3.1* 5.1  CL 98 96 97 96 96 101  CO2 30 28 31 30  34* 34*  GLUCOSE 154* 208* 215* 329* 175* 127*  BUN 10 8 11 13 14 15   CREATININE 0.74 0.62 0.59 0.57 0.63 0.59  CALCIUM 9.8 9.5 10.3 10.2 10.3 10.0  MG 1.4*  --  1.6  --   --   --    Liver Function Tests: No results found for this basename: AST, ALT, ALKPHOS, BILITOT, PROT, ALBUMIN,  in the last 168 hours No results found for this basename: LIPASE, AMYLASE,  in the last 168 hours No results found for this basename: AMMONIA,  in the last 168 hours CBC:  Recent Labs  Lab 05/27/13 0415 05/28/13 0425 05/29/13 0353 05/30/13 0430 05/31/13 0425  WBC 15.1* 9.9 10.8* 12.2* 15.6*  HGB 10.4* 10.4* 10.7* 10.2* 10.2*  HCT 31.8* 31.7* 33.9* 32.4* 32.3*  MCV 87.8 88.1 89.2 89.5 89.0  PLT 221 237 286 322 294   Cardiac Enzymes: No results found for this basename: CKTOTAL, CKMB, CKMBINDEX, TROPONINI,  in the last 168 hours BNP (last 3 results)  Recent Labs  06/24/12 1600 08/12/12 0948 05/25/13 1040  PROBNP 1253.0* 301.2 1061.0*   CBG:  Recent Labs Lab 05/30/13 1552 05/30/13 1955 05/30/13 2335 05/31/13 0348 05/31/13 0447  GLUCAP 307*  325* 138* 61* 129*    Recent Results (from the past 240 hour(s))  MRSA PCR SCREENING     Status: Abnormal   Collection Time    05/23/13 11:50 PM      Result Value Range Status   MRSA by PCR POSITIVE (*) NEGATIVE Final   Comment:            The GeneXpert MRSA Assay (FDA     approved for NASAL specimens     only), is one component of a     comprehensive MRSA colonization     surveillance program. It is not     intended to diagnose MRSA     infection nor to guide or     monitor treatment for     MRSA infections.     RESULT CALLED TO, READ BACK BY AND VERIFIED WITH:     CALLED TO RN MELISSA HANCOCK [phone] @[phone]  [name]     Studies: Ct Angio Head W/cm &/or Wo Cm  05/30/2013   CLINICAL DATA:  Right-sided weakness.  EXAM: CT ANGIOGRAPHY HEAD  TECHNIQUE: Multidetector CT imaging of the head was performed using the standard protocol during bolus administration of intravenous contrast. Multiplanar CT image reconstructions including MIPs were obtained to evaluate the vascular anatomy.  CONTRAST:  50 mL Omnipaque 350  COMPARISON:  CT head without contrast 06/09/2013  FINDINGS: Diffuse white matter changes are again noted. The source images demonstrate no definite acute cortical infarct. Postcontrast images demonstrate no pathologic enhancement. The dural sinuses fill normally.  Dense atherosclerotic calcifications are present in the cavernous carotid arteries bilaterally with mild to moderate stenoses bilaterally, left greater than right. There is mild narrowing of the distal M1 segments bilaterally. The MCA bifurcations are intact anterior communicating artery is patent. Mild to moderate segmental narrowing is present in the distal MCA branch vessels bilaterally.  In the vertebral arteries demonstrate atherosclerotic calcifications at the dural margin bilaterally. The left vertebral artery is slightly dominant to the right. The PICA origins are visualized and within normal limits bilaterally. The  basilar artery is intact. The right posterior cerebral artery is of fetal type. There is attenuation of distal PCA branch vessels bilaterally.  Review of the MIP images confirms the above findings.  IMPRESSION: 1. No acute cortical infarct. 2. Mild-to-moderate cavernous carotid artery stenosis bilaterally, left greater than right. 3. Diffuse small vessel disease. 4. Atherosclerotic irregularity within the vertebral arteries bilaterally without significant stenosis.   Electronically Signed   By: Gennette Pac M.D.   On: 05/30/2013 19:11   Ct Head Wo Contrast  05/30/2013   CLINICAL DATA:  Right-sided weakness. Hypertension and hyperlipidemia. Atrial fibrillation. Diabetic.  EXAM: CT HEAD WITHOUT CONTRAST  TECHNIQUE: Contiguous axial images were obtained from the base of the skull through the vertex without intravenous contrast.  COMPARISON:  04/05/2013 CT.  FINDINGS: No intracranial hemorrhage.  Prominent  white matter type changes without CT evidence of large acute infarct. Small acute infarct would be difficult to exclude given the degree of white matter type changes.  Global atrophy without hydrocephalus.  Vascular calcifications.  No intracranial mass lesion noted on this unenhanced exam.  IMPRESSION: No intracranial hemorrhage.  Prominent white matter type changes without CT evidence of large acute infarct. Small acute infarct would be difficult to exclude given the degree of white matter type changes.  Global atrophy without hydrocephalus.   Electronically Signed   By: Bridgett Larsson M.D.   On: 05/30/2013 12:15   Dg Chest Port 1 View  05/29/2013   CLINICAL DATA:  Confirm line placement  EXAM: PORTABLE CHEST - 1 VIEW  COMPARISON:  05/25/2013  FINDINGS: There is a right arm PICC line with tip 6.2 cm below the carina. There is a left chest wall pacer device with leads in the right atrial appendage right ventricle. Lung volumes remain low. Small left pleural effusion is identified. There is atelectasis noted in  the left base.  IMPRESSION: 1. Right arm PICC line tip is in the right atrium. Recommend withdrawing by 1.5 cm. 2. Left pleural effusion with left base atelectasis.   Electronically Signed   By: Signa Kell M.D.   On: 05/29/2013 17:57    Scheduled Meds: . ampicillin-sulbactam (UNASYN) IV  3 g Intravenous Q6H  . antiseptic oral rinse  15 mL Mouth Rinse q12n4p  . azelastine  2 spray Each Nare BID  . calcitonin (salmon)  1 spray Alternating Nares Daily  . chlorhexidine  15 mL Mouth Rinse BID  . Chlorhexidine Gluconate Cloth  6 each Topical Q0600  . cyanocobalamin  1,000 mcg Intramuscular Q30 days  . diltiazem  30 mg Oral Q6H  . furosemide  40 mg Oral BID  . insulin aspart  0-15 Units Subcutaneous TID WC  . insulin aspart  0-5 Units Subcutaneous QHS  . latanoprost  1 drop Both Eyes QHS  . levothyroxine  50 mcg Oral QAC breakfast  . lidocaine  1 patch Transdermal Q24H  . mupirocin ointment   Nasal BID  . sodium chloride  10-40 mL Intracatheter Q12H  . sodium chloride  3 mL Intravenous Q12H  . sotalol  80 mg Oral Q12H   Continuous Infusions: . heparin 1,250 Units/hr (05/31/13 0801)    Principal Problem:   Diverticulitis of colon (without mention of hemorrhage) Active Problems:   DIABETES MELLITUS   HYPERLIPIDEMIA   OBESITY   HYPERTENSION   Atrial fibrillation   Acute on chronic diastolic heart failure   Occlusion and stenosis of carotid artery without mention of cerebral infarction   Compression fracture of L1 lumbar vertebra   Cardiac pacemaker in situ   ILD (interstitial lung disease)   Atrial flutter    Time spent: >35 minutes     Esperanza Sheets  [facility] Pager [phone]. If 7PM-7AM, please contact night-coverage at [email or web address], password [facility] 05/31/2013, 8:09 AM  LOS: 8 days

## 2013-05-31 NOTE — Progress Notes (Signed)
Patient ID: Alexandria [name], female   DOB: September 09, 1929, 79 y.o.   MRN: [id number]    Subjective: Pt doesn't feel well today at all.  Feels bad all over.  Not hungry.  Difficult to tell if abdominal pain has worsened given how poorly she feels overall  Objective: Vital signs in last 24 hours: Temp:  [97.5 F (36.4 C)-98.3 F (36.8 C)] 98.2 F (36.8 C) (12/04 0754) Pulse Rate:  [72-89] 87 (12/04 0754) Resp:  [15-35] 20 (12/04 0754) BP: (106-159)/(51-66) 139/58 mmHg (12/04 0754) SpO2:  [94 %-99 %] 94 % (12/04 0754) Weight:  [181 lb 7 oz (82.3 kg)] 181 lb 7 oz (82.3 kg) (12/04 0400) Last BM Date:  (PTA)  Intake/Output from previous day: 12/03 0701 - 12/04 0700 In: 534 [I.V.:34; IV Piggyback:500] Out: 1750 [Urine:1750] Intake/Output this shift:    PE: Abd: soft, tender in LLQ and suprapubic region with some voluntary guarding with palpation, few BS, ND Heart: mildly tachy Lungs: CTAB  Lab Results:   Recent Labs  05/30/13 0430 05/31/13 0425  WBC 12.2* 15.6*  HGB 10.2* 10.2*  HCT 32.4* 32.3*  PLT 322 294   BMET  Recent Labs  05/29/13 0353 05/30/13 0430  NA 137 140  K 3.1* 5.1  CL 96 101  CO2 34* 34*  GLUCOSE 175* 127*  BUN 14 15  CREATININE 0.63 0.59  CALCIUM 10.3 10.0   PT/INR  Recent Labs  05/30/13 0430 05/31/13 0544  LABPROT 14.8 17.2*  INR 1.19 1.44   CMP     Component Value Date/Time   NA 140 05/30/2013 0430   NA 141 03/08/2013 1221   K 5.1 05/30/2013 0430   CL 101 05/30/2013 0430   CO2 34* 05/30/2013 0430   GLUCOSE 127* 05/30/2013 0430   GLUCOSE 160* 03/08/2013 1221   BUN 15 05/30/2013 0430   BUN 20 03/08/2013 1221   CREATININE 0.59 05/30/2013 0430   CREATININE 0.96 12/05/2012 1251   CALCIUM 10.0 05/30/2013 0430   PROT 6.6 05/24/2013 0525   PROT 7.1 03/08/2013 1221   ALBUMIN 2.6* 05/24/2013 0525   AST 10 05/24/2013 0525   ALT 5 05/24/2013 0525   ALKPHOS 81 05/24/2013 0525   BILITOT 0.5 05/24/2013 0525   GFRNONAA 84* 05/30/2013 0430   GFRAA >90  05/30/2013 0430   Lipase     Component Value Date/Time   LIPASE 32 08/12/2012 0948       Studies/Results: Ct Angio Head W/cm &/or Wo Cm  05/30/2013   CLINICAL DATA:  Right-sided weakness.  EXAM: CT ANGIOGRAPHY HEAD  TECHNIQUE: Multidetector CT imaging of the head was performed using the standard protocol during bolus administration of intravenous contrast. Multiplanar CT image reconstructions including MIPs were obtained to evaluate the vascular anatomy.  CONTRAST:  50 mL Omnipaque 350  COMPARISON:  CT head without contrast 06/09/2013  FINDINGS: Diffuse white matter changes are again noted. The source images demonstrate no definite acute cortical infarct. Postcontrast images demonstrate no pathologic enhancement. The dural sinuses fill normally.  Dense atherosclerotic calcifications are present in the cavernous carotid arteries bilaterally with mild to moderate stenoses bilaterally, left greater than right. There is mild narrowing of the distal M1 segments bilaterally. The MCA bifurcations are intact anterior communicating artery is patent. Mild to moderate segmental narrowing is present in the distal MCA branch vessels bilaterally.  In the vertebral arteries demonstrate atherosclerotic calcifications at the dural margin bilaterally. The left vertebral artery is slightly dominant to the right. The PICA origins are  visualized and within normal limits bilaterally. The basilar artery is intact. The right posterior cerebral artery is of fetal type. There is attenuation of distal PCA branch vessels bilaterally.  Review of the MIP images confirms the above findings.  IMPRESSION: 1. No acute cortical infarct. 2. Mild-to-moderate cavernous carotid artery stenosis bilaterally, left greater than right. 3. Diffuse small vessel disease. 4. Atherosclerotic irregularity within the vertebral arteries bilaterally without significant stenosis.   Electronically Signed   By: Gennette Pac M.D.   On: 05/30/2013 19:11   Ct  Head Wo Contrast  05/30/2013   CLINICAL DATA:  Right-sided weakness. Hypertension and hyperlipidemia. Atrial fibrillation. Diabetic.  EXAM: CT HEAD WITHOUT CONTRAST  TECHNIQUE: Contiguous axial images were obtained from the base of the skull through the vertex without intravenous contrast.  COMPARISON:  04/05/2013 CT.  FINDINGS: No intracranial hemorrhage.  Prominent white matter type changes without CT evidence of large acute infarct. Small acute infarct would be difficult to exclude given the degree of white matter type changes.  Global atrophy without hydrocephalus.  Vascular calcifications.  No intracranial mass lesion noted on this unenhanced exam.  IMPRESSION: No intracranial hemorrhage.  Prominent white matter type changes without CT evidence of large acute infarct. Small acute infarct would be difficult to exclude given the degree of white matter type changes.  Global atrophy without hydrocephalus.   Electronically Signed   By: Bridgett Larsson M.D.   On: 05/30/2013 12:15   Dg Chest Port 1 View  05/29/2013   CLINICAL DATA:  Confirm line placement  EXAM: PORTABLE CHEST - 1 VIEW  COMPARISON:  05/25/2013  FINDINGS: There is a right arm PICC line with tip 6.2 cm below the carina. There is a left chest wall pacer device with leads in the right atrial appendage right ventricle. Lung volumes remain low. Small left pleural effusion is identified. There is atelectasis noted in the left base.  IMPRESSION: 1. Right arm PICC line tip is in the right atrium. Recommend withdrawing by 1.5 cm. 2. Left pleural effusion with left base atelectasis.   Electronically Signed   By: Signa Kell M.D.   On: 05/29/2013 17:57   Dg Abd Portable 1v  05/31/2013   CLINICAL DATA:  Abdominal pain  EXAM: PORTABLE ABDOMEN - 1 VIEW  COMPARISON:  05/25/2013  FINDINGS: Barium remains throughout the colon, unchanged from the prior study. Negative for bowel dilatation. No significant stool in the colon. Small bowel is nondilated.  IMPRESSION:  Retained contrast throughout the colon is unchanged due to poor motility. Negative for obstruction.   Electronically Signed   By: Marlan Palau M.D.   On: 05/31/2013 09:08    Anti-infectives: Anti-infectives   Start     Dose/Rate Route Frequency Ordered Stop   05/31/13 1000  ertapenem (INVANZ) 1 g in sodium chloride 0.9 % 50 mL IVPB     1 g 100 mL/hr over 30 Minutes Intravenous Every 24 hours 05/31/13 0947     05/29/13 2200  Ampicillin-Sulbactam (UNASYN) 3 g in sodium chloride 0.9 % 100 mL IVPB  Status:  Discontinued    Comments:  Switch from Invanz to unasyn   3 g 100 mL/hr over 60 Minutes Intravenous Every 6 hours 05/29/13 1943 05/31/13 0947   05/29/13 1800  Ampicillin-Sulbactam (UNASYN) 3 g in sodium chloride 0.9 % 100 mL IVPB  Status:  Discontinued    Comments:  Switch from Invanz to unasyn   3 g 100 mL/hr over 60 Minutes Intravenous Every 6 hours 05/29/13  1630 05/29/13 1943   05/29/13 1030  Ampicillin-Sulbactam (UNASYN) 3 g in sodium chloride 0.9 % 100 mL IVPB  Status:  Discontinued    Comments:  Switch from Invanz to unasyn   3 g 100 mL/hr over 60 Minutes Intravenous Every 6 hours 05/29/13 0926 05/29/13 1630   05/25/13 1130  ertapenem (INVANZ) 1 g in sodium chloride 0.9 % 50 mL IVPB  Status:  Discontinued     1 g 100 mL/hr over 30 Minutes Intravenous Every 24 hours 05/25/13 1126 05/29/13 0926   05/23/13 2200  metroNIDAZOLE (FLAGYL) IVPB 500 mg  Status:  Discontinued     500 mg 100 mL/hr over 60 Minutes Intravenous Every 8 hours 05/23/13 2140 05/25/13 1126   05/23/13 2145  ciprofloxacin (CIPRO) IVPB 400 mg  Status:  Discontinued     400 mg 200 mL/hr over 60 Minutes Intravenous Every 12 hours 05/23/13 2140 05/25/13 1126       Assessment/Plan  1.  Acute diverticulitis with microperforation, no abscess 2. Increasing leukocytosis Patient Active Problem List   Diagnosis Date Noted  . Atrial flutter 05/25/2013  . Diverticulitis of colon (without mention of hemorrhage)  05/23/2013  . Cardiac pacemaker in situ 05/23/2013  . ILD (interstitial lung disease) 05/23/2013  . Diverticulitis 05/23/2013  . Compression fracture of L1 lumbar vertebra 04/06/2013  . Afib 09/13/2012  . Vertigo 08/12/2012  . Pacemaker-[employer] 03/06/2012  . Chronic diastolic CHF (congestive heart failure) 01/23/2012  . Occlusion and stenosis of carotid artery without mention of cerebral infarction 01/19/2012  . Tachycardia-bradycardia syndrome 10/10/2011  . Hypomagnesemia 09/01/2011  . Weakness generalized 08/31/2011  . Leukocytosis 08/31/2011  . UTI (lower urinary tract infection) 08/17/2011  . Acute on chronic diastolic heart failure 05/20/2011  . EDEMA 10/22/2009  . Atrial fibrillation 09/05/2009  . Acute on chronic combined systolic and diastolic heart failure 07/18/2009  . CAD 06/16/2009  . OBESITY 02/25/2009  . PREMATURE ATRIAL CONTRACTIONS 02/25/2009  . DYSPNEA 02/25/2009  . DIABETES MELLITUS 07/28/2007  . HYPERLIPIDEMIA 07/28/2007  . HYPERTENSION 07/28/2007  . COLONIC POLYPS, ADENOMATOUS, HX OF 05/31/2007   Plan: 1. Will make patient NPO x ice chips for now.   2. Dc unasyn and switch back to Invanz given continued increase in WBC 3. Obtain CT scan of abdomen and pelvis to rule out developing abscess or complication. 4. Recheck labs in am.  LOS: 8 days    Loui Massenburg E 05/31/2013, 9:48 AM Pager: [phone]

## 2013-05-31 NOTE — Progress Notes (Signed)
Inpatient Diabetes Program Recommendations  AACE/ADA: New Consensus Statement on Inpatient Glycemic Control (2013)  Target Ranges:  Prepandial:   less than 140 mg/dL      Peak postprandial:   less than 180 mg/dL (1-2 hours)      Critically ill patients:  140 - 180 mg/dL     Results for Alexandria [name], Alexandria [name] (MRN [id number]) as of 05/31/2013 08:08  Ref. Range 05/29/2013 00:06 05/29/2013 03:30 05/29/2013 07:49 05/29/2013 12:05 05/29/2013 16:10 05/29/2013 19:53  Glucose-Capillary Latest Range: 70-99 mg/dL 132 (H) 440 (H) 102 (H) 318 (H) 382 (H) 357 (H)    Results for Alexandria [name], Alexandria [name] (MRN [id number]) as of 05/31/2013 08:08  Ref. Range 05/29/2013 23:42 05/30/2013 03:46 05/30/2013 08:49 05/30/2013 11:09 05/30/2013 15:52 05/30/2013 19:55  Glucose-Capillary Latest Range: 70-99 mg/dL 347 (H) 425 (H) 956 (H) 277 (H) 307 (H) 325 (H)    Results for Alexandria [name], Alexandria [name] (MRN [id number]) as of 05/31/2013 08:08  Ref. Range 05/30/2013 23:35 05/31/2013 03:48 05/31/2013 04:47  Glucose-Capillary Latest Range: 70-99 mg/dL 951 (H) 61 (L) 884 (H)    **Looking at the past 2 days, patient has been having significant glucose elevations in the afternoon.  Now on FL diet.  Noted Novolog Moderate SSI has been changed to tid ac + HS this morning.  **Patient likely had hypoglycemic event this morning b/c she received two large doses of Novolog too close together late last night.  Patient's CBG at 4pm yesterday was 307 mg/dl, however, patient did not receive her Novolog until 7pm (patient was given 11 units of Novolog at 7pm).  Patient's CBG at 8pm was 325 mg/dl.  Patient then received another 11 units of Novolog at 8pm for that CBG.  22 units of Novolog likely precipitated patient's low CBG at 4am today.   **MD- Recommend the following in-hospital insulin adjustments:  Add Novolog meal coverage- Novolog 4 units tid with meals    Will follow. Ambrose Finland RN, MSN, CDE Diabetes Coordinator Inpatient Diabetes Program Team Pager:  [phone] (8a-10p)

## 2013-06-01 DIAGNOSIS — E119 Type 2 diabetes mellitus without complications: Secondary | ICD-10-CM

## 2013-06-01 LAB — BASIC METABOLIC PANEL
CO2: 36 mEq/L — ABNORMAL HIGH (ref 19–32)
Calcium: 9.2 mg/dL (ref 8.4–10.5)
Creatinine, Ser: 0.53 mg/dL (ref 0.50–1.10)
GFR calc Af Amer: >90 mL/min (ref >90)
GFR calc non Af Amer: 87 mL/min — ABNORMAL LOW (ref >90)
Glucose, Bld: 234 mg/dL — ABNORMAL HIGH (ref 70–99)
Potassium: 3.7 mEq/L (ref 3.5–5.1)
Sodium: 137 mEq/L (ref 135–145)

## 2013-06-01 LAB — PROTIME-INR
INR: 2.18 — ABNORMAL HIGH (ref 0.00–1.49)
Prothrombin Time: 23.6 seconds — ABNORMAL HIGH (ref 11.6–15.2)

## 2013-06-01 LAB — GLUCOSE, CAPILLARY
Glucose-Capillary: 229 mg/dL — ABNORMAL HIGH (ref 70–99)
Glucose-Capillary: 180 mg/dL — ABNORMAL HIGH (ref 70–99)
Glucose-Capillary: 144 mg/dL — ABNORMAL HIGH (ref 70–99)
Glucose-Capillary: 310 mg/dL — ABNORMAL HIGH (ref 70–99)
Glucose-Capillary: 366 mg/dL — ABNORMAL HIGH (ref 70–99)
Glucose-Capillary: 266 mg/dL — ABNORMAL HIGH (ref 70–99)

## 2013-06-01 LAB — CBC
MCH: 27.8 pg (ref 26.0–34.0)
MCHC: 31.1 g/dL (ref 30.0–36.0)
RDW: 13.7 % (ref 11.5–15.5)

## 2013-06-01 LAB — HEPARIN LEVEL (UNFRACTIONATED): Heparin Unfractionated: 0.31 IU/mL (ref 0.30–0.70)

## 2013-06-01 MED ORDER — BOOST / RESOURCE BREEZE PO LIQD
1.0000 | Freq: Three times a day (TID) | ORAL | Status: DC
  Administered 2013-06-01 – 2013-06-06 (×15): 1 via ORAL

## 2013-06-01 MED ORDER — HEPARIN (PORCINE) IN NACL 100-0.45 UNIT/ML-% IJ SOLN
1550.0000 [IU]/h | INTRAMUSCULAR | Status: DC
  Filled 2013-06-01: qty 250

## 2013-06-01 NOTE — Progress Notes (Signed)
Agree with A&P of KO,PA. She is much more alert and "with it" than yesterday. Reviewed CT scan and suspect most of findings are chronic but original scan not available for review. She seems to respond to [facility], so may need to keep on this for several more days.

## 2013-06-01 NOTE — Progress Notes (Signed)
Report called to albert rn, pt transferring to [phone] via bed with belongings.

## 2013-06-01 NOTE — Progress Notes (Signed)
SUBJECTIVE:  She denies any pain.  She is very weak but better than yesterday.     PHYSICAL EXAM Filed Vitals:   05/31/13 2341 06/01/13 0400 06/01/13 0506 06/01/13 0737  BP:  148/55 124/46 136/52  Pulse: 72 78  76  Temp:  98 F (36.7 C)  98 F (36.7 C)  TempSrc:  Oral  Oral  Resp: 20 29  27   Height:      Weight:      SpO2: 93% 95%  96%   General:  No distress Lungs:  Dry crackles Heart:  Irregular Abdomen:  Positive bowel sounds decreased infrequency, no rebound no guarding, less tenderness than yesterday.  Extremities:  No edema  LABS:  Results for orders placed during the hospital encounter of 05/23/13 (from the past 24 hour(s))  GLUCOSE, CAPILLARY     Status: Abnormal   Collection Time    05/31/13 11:16 AM      Result Value Range   Glucose-Capillary 125 (*) 70 - 99 mg/dL  HEPARIN LEVEL (UNFRACTIONATED)     Status: Abnormal   Collection Time    05/31/13  2:00 PM      Result Value Range   Heparin Unfractionated <0.10 (*) 0.30 - 0.70 IU/mL  GLUCOSE, CAPILLARY     Status: Abnormal   Collection Time    05/31/13  5:36 PM      Result Value Range   Glucose-Capillary 194 (*) 70 - 99 mg/dL  GLUCOSE, CAPILLARY     Status: Abnormal   Collection Time    05/31/13  7:54 PM      Result Value Range   Glucose-Capillary 248 (*) 70 - 99 mg/dL   Comment 1 Notify RN     Comment 2 Documented in Chart    HEPARIN LEVEL (UNFRACTIONATED)     Status: None   Collection Time    05/31/13 10:15 PM      Result Value Range   Heparin Unfractionated 0.31  0.30 - 0.70 IU/mL  GLUCOSE, CAPILLARY     Status: Abnormal   Collection Time    05/31/13 11:34 PM      Result Value Range   Glucose-Capillary 221 (*) 70 - 99 mg/dL   Comment 1 Notify RN     Comment 2 Documented in Chart    PROTIME-INR     Status: Abnormal   Collection Time    06/01/13  4:00 AM      Result Value Range   Prothrombin Time 23.6 (*) 11.6 - 15.2 seconds   INR 2.18 (*) 0.00 - 1.49  HEPARIN LEVEL (UNFRACTIONATED)      Status: None   Collection Time    06/01/13  4:00 AM      Result Value Range   Heparin Unfractionated 0.31  0.30 - 0.70 IU/mL  CBC     Status: Abnormal   Collection Time    06/01/13  4:00 AM      Result Value Range   WBC 10.5  4.0 - 10.5 K/uL   RBC 3.71 (*) 3.87 - 5.11 MIL/uL   Hemoglobin 10.3 (*) 12.0 - 15.0 g/dL   HCT 16.1 (*) 09.6 - 04.5 %   MCV 89.2  78.0 - 100.0 fL   MCH 27.8  26.0 - 34.0 pg   MCHC 31.1  30.0 - 36.0 g/dL   RDW 40.9  81.1 - 91.4 %   Platelets 256  150 - 400 K/uL  BASIC METABOLIC PANEL     Status: Abnormal  Collection Time    06/01/13  4:00 AM      Result Value Range   Sodium 137  135 - 145 mEq/L   Potassium 3.7  3.5 - 5.1 mEq/L   Chloride 95 (*) 96 - 112 mEq/L   CO2 36 (*) 19 - 32 mEq/L   Glucose, Bld 234 (*) 70 - 99 mg/dL   BUN 6  6 - 23 mg/dL   Creatinine, Ser 0.98  0.50 - 1.10 mg/dL   Calcium 9.2  8.4 - 11.9 mg/dL   GFR calc non Af Amer 87 (*) >90 mL/min   GFR calc Af Amer >90  >90 mL/min  GLUCOSE, CAPILLARY     Status: Abnormal   Collection Time    06/01/13  4:05 AM      Result Value Range   Glucose-Capillary 229 (*) 70 - 99 mg/dL   Comment 1 Notify RN     Comment 2 Documented in Chart    GLUCOSE, CAPILLARY     Status: Abnormal   Collection Time    06/01/13  7:40 AM      Result Value Range   Glucose-Capillary 180 (*) 70 - 99 mg/dL   Comment 1 Documented in Chart     Comment 2 Notify RN      Intake/Output Summary (Last 24 hours) at 06/01/13 0959 Last data filed at 06/01/13 0800  Gross per 24 hour  Intake 1570.42 ml  Output   2775 ml  Net -1204.58 ml     ASSESSMENT AND PLAN:  ATRIAL FIB:   On Sotalol and heparin.  Now in NSR.  Continue PO Cardizem.  Start warfarin when OK with primary team/surgery.   Repeat EKG in AM.  QT was slightly longer yesterday. INR is elevated.  I would stop heparin and resume warfarin when OK.  DVT prophylaxis if INR falls below two.   ACUTE ON CHRONIC DIASTOLIC HF:   She seems to be euvolemic.        Fayrene Fearing [facility] 06/01/2013 9:59 AM

## 2013-06-01 NOTE — Progress Notes (Signed)
Patient ID: Alexandria [name], female   DOB: 05-30-1930, 79 y.o.   MRN: [id number]    Subjective: Pt feels much better today and looks much better.  Almost no pain today  Objective: Vital signs in last 24 hours: Temp:  [98 F (36.7 C)-98.8 F (37.1 C)] 98 F (36.7 C) (12/05 0737) Pulse Rate:  [72-85] 76 (12/05 0737) Resp:  [18-34] 27 (12/05 0737) BP: (124-148)/(46-75) 136/52 mmHg (12/05 0737) SpO2:  [73 %-98 %] 96 % (12/05 0737) Last BM Date:  (PTA)  Intake/Output from previous day: 12/04 0701 - 12/05 0700 In: 704.4 [P.O.:25; I.V.:629.4; IV Piggyback:50] Out: 2650 [Urine:2650] Intake/Output this shift: Total I/O In: 901.5 [I.V.:901.5] Out: 125 [Urine:125]  PE: Abd: soft, much less tender, +BS, ND Heart: regular Lungs: CTAB  Lab Results:   Recent Labs  05/31/13 0425 06/01/13 0400  WBC 15.6* 10.5  HGB 10.2* 10.3*  HCT 32.3* 33.1*  PLT 294 256   BMET  Recent Labs  05/30/13 0430 06/01/13 0400  NA 140 137  K 5.1 3.7  CL 101 95*  CO2 34* 36*  GLUCOSE 127* 234*  BUN 15 6  CREATININE 0.59 0.53  CALCIUM 10.0 9.2   PT/INR  Recent Labs  05/31/13 0544 06/01/13 0400  LABPROT 17.2* 23.6*  INR 1.44 2.18*   CMP     Component Value Date/Time   NA 137 06/01/2013 0400   NA 141 03/08/2013 1221   K 3.7 06/01/2013 0400   CL 95* 06/01/2013 0400   CO2 36* 06/01/2013 0400   GLUCOSE 234* 06/01/2013 0400   GLUCOSE 160* 03/08/2013 1221   BUN 6 06/01/2013 0400   BUN 20 03/08/2013 1221   CREATININE 0.53 06/01/2013 0400   CREATININE 0.96 12/05/2012 1251   CALCIUM 9.2 06/01/2013 0400   PROT 6.6 05/24/2013 0525   PROT 7.1 03/08/2013 1221   ALBUMIN 2.6* 05/24/2013 0525   AST 10 05/24/2013 0525   ALT 5 05/24/2013 0525   ALKPHOS 81 05/24/2013 0525   BILITOT 0.5 05/24/2013 0525   GFRNONAA 87* 06/01/2013 0400   GFRAA >90 06/01/2013 0400   Lipase     Component Value Date/Time   LIPASE 32 08/12/2012 0948       Studies/Results: Ct Angio Head W/cm &/or Wo Cm  05/30/2013    CLINICAL DATA:  Right-sided weakness.  EXAM: CT ANGIOGRAPHY HEAD  TECHNIQUE: Multidetector CT imaging of the head was performed using the standard protocol during bolus administration of intravenous contrast. Multiplanar CT image reconstructions including MIPs were obtained to evaluate the vascular anatomy.  CONTRAST:  50 mL Omnipaque 350  COMPARISON:  CT head without contrast 06/09/2013  FINDINGS: Diffuse white matter changes are again noted. The source images demonstrate no definite acute cortical infarct. Postcontrast images demonstrate no pathologic enhancement. The dural sinuses fill normally.  Dense atherosclerotic calcifications are present in the cavernous carotid arteries bilaterally with mild to moderate stenoses bilaterally, left greater than right. There is mild narrowing of the distal M1 segments bilaterally. The MCA bifurcations are intact anterior communicating artery is patent. Mild to moderate segmental narrowing is present in the distal MCA branch vessels bilaterally.  In the vertebral arteries demonstrate atherosclerotic calcifications at the dural margin bilaterally. The left vertebral artery is slightly dominant to the right. The PICA origins are visualized and within normal limits bilaterally. The basilar artery is intact. The right posterior cerebral artery is of fetal type. There is attenuation of distal PCA branch vessels bilaterally.  Review of the MIP images confirms  the above findings.  IMPRESSION: 1. No acute cortical infarct. 2. Mild-to-moderate cavernous carotid artery stenosis bilaterally, left greater than right. 3. Diffuse small vessel disease. 4. Atherosclerotic irregularity within the vertebral arteries bilaterally without significant stenosis.   Electronically Signed   By: Gennette Pac M.D.   On: 05/30/2013 19:11   Ct Head Wo Contrast  05/30/2013   CLINICAL DATA:  Right-sided weakness. Hypertension and hyperlipidemia. Atrial fibrillation. Diabetic.  EXAM: CT HEAD WITHOUT  CONTRAST  TECHNIQUE: Contiguous axial images were obtained from the base of the skull through the vertex without intravenous contrast.  COMPARISON:  04/05/2013 CT.  FINDINGS: No intracranial hemorrhage.  Prominent white matter type changes without CT evidence of large acute infarct. Small acute infarct would be difficult to exclude given the degree of white matter type changes.  Global atrophy without hydrocephalus.  Vascular calcifications.  No intracranial mass lesion noted on this unenhanced exam.  IMPRESSION: No intracranial hemorrhage.  Prominent white matter type changes without CT evidence of large acute infarct. Small acute infarct would be difficult to exclude given the degree of white matter type changes.  Global atrophy without hydrocephalus.   Electronically Signed   By: Bridgett Larsson M.D.   On: 05/30/2013 12:15   Ct Abdomen Pelvis W Contrast  05/31/2013   CLINICAL DATA:  Acute diverticulitis with microperforation. Worsening leukocytosis.  EXAM: CT ABDOMEN AND PELVIS WITH CONTRAST  TECHNIQUE: Multidetector CT imaging of the abdomen and pelvis was performed using the standard protocol following bolus administration of intravenous contrast.  CONTRAST:  80mL OMNIPAQUE IOHEXOL 300 MG/ML  SOLN  COMPARISON:  None.  FINDINGS: Images through the lung bases show small bilateral pleural effusions bibasilar atelectasis. Diffuse body wall edema also noted.  Mild diverticulitis is seen involving the sigmoid colon. A small amount of free fluid is seen in the pelvic cul-de-sac. There is also small extraluminal gas bubbles seen in the adjacent sigmoid mesocolon. In addition, there is a small amount of free intraperitoneal air, consistent with perforation.  The liver, pancreas, adrenal glands, and kidneys are normal in appearance, except for small renal cysts. No evidence of hydronephrosis. Small ill-defined low-attenuation lesion in the spleen is likely benign. No evidence of splenomegaly. No soft tissue masses or  lymphadenopathy identified. Foley catheter is seen within the bladder which is collapsed. No evidence of bowel obstruction. Old L1 vertebral body compression fracture deformity incidentally noted as well as lumbar spine degenerative changes.  IMPRESSION: Perforated sigmoid diverticulitis, with small amount of free intraperitoneal air noted.  Small amount of free fluid in pelvic cul-de-sac. No drainable abscess visualized.  Critical Value/emergent results were called by telephone at the time of interpretation on 05/31/2013 at 5:22 PM to patient's floor nurse Okey Regal, who verbally acknowledged these results.  Small bilateral pleural effusions and bibasilar atelectasis.   Electronically Signed   By: Myles Rosenthal M.D.   On: 05/31/2013 17:25   Dg Abd Portable 1v  05/31/2013   CLINICAL DATA:  Abdominal pain  EXAM: PORTABLE ABDOMEN - 1 VIEW  COMPARISON:  05/25/2013  FINDINGS: Barium remains throughout the colon, unchanged from the prior study. Negative for bowel dilatation. No significant stool in the colon. Small bowel is nondilated.  IMPRESSION: Retained contrast throughout the colon is unchanged due to poor motility. Negative for obstruction.   Electronically Signed   By: Marlan Palau M.D.   On: 05/31/2013 09:08    Anti-infectives: Anti-infectives   Start     Dose/Rate Route Frequency Ordered Stop   05/31/13  1100  ertapenem (INVANZ) 1 g in sodium chloride 0.9 % 50 mL IVPB     1 g 100 mL/hr over 30 Minutes Intravenous Every 24 hours 05/31/13 0947     05/29/13 2200  Ampicillin-Sulbactam (UNASYN) 3 g in sodium chloride 0.9 % 100 mL IVPB  Status:  Discontinued    Comments:  Switch from Invanz to unasyn   3 g 100 mL/hr over 60 Minutes Intravenous Every 6 hours 05/29/13 1943 05/31/13 0947   05/29/13 1800  Ampicillin-Sulbactam (UNASYN) 3 g in sodium chloride 0.9 % 100 mL IVPB  Status:  Discontinued    Comments:  Switch from Invanz to unasyn   3 g 100 mL/hr over 60 Minutes Intravenous Every 6 hours 05/29/13  1630 05/29/13 1943   05/29/13 1030  Ampicillin-Sulbactam (UNASYN) 3 g in sodium chloride 0.9 % 100 mL IVPB  Status:  Discontinued    Comments:  Switch from Invanz to unasyn   3 g 100 mL/hr over 60 Minutes Intravenous Every 6 hours 05/29/13 0926 05/29/13 1630   05/25/13 1130  ertapenem (INVANZ) 1 g in sodium chloride 0.9 % 50 mL IVPB  Status:  Discontinued     1 g 100 mL/hr over 30 Minutes Intravenous Every 24 hours 05/25/13 1126 05/29/13 0926   05/23/13 2200  metroNIDAZOLE (FLAGYL) IVPB 500 mg  Status:  Discontinued     500 mg 100 mL/hr over 60 Minutes Intravenous Every 8 hours 05/23/13 2140 05/25/13 1126   05/23/13 2145  ciprofloxacin (CIPRO) IVPB 400 mg  Status:  Discontinued     400 mg 200 mL/hr over 60 Minutes Intravenous Every 12 hours 05/23/13 2140 05/25/13 1126       Assessment/Plan  1. Diverticulitis with microperforation, no abscess Patient Active Problem List   Diagnosis Date Noted  . Atrial flutter 05/25/2013  . Diverticulitis of colon (without mention of hemorrhage) 05/23/2013  . Cardiac pacemaker in situ 05/23/2013  . ILD (interstitial lung disease) 05/23/2013  . Diverticulitis 05/23/2013  . Compression fracture of L1 lumbar vertebra 04/06/2013  . Afib 09/13/2012  . Vertigo 08/12/2012  . Pacemaker-[employer] 03/06/2012  . Chronic diastolic CHF (congestive heart failure) 01/23/2012  . Occlusion and stenosis of carotid artery without mention of cerebral infarction 01/19/2012  . Tachycardia-bradycardia syndrome 10/10/2011  . Hypomagnesemia 09/01/2011  . Weakness generalized 08/31/2011  . Leukocytosis 08/31/2011  . UTI (lower urinary tract infection) 08/17/2011  . Acute on chronic diastolic heart failure 05/20/2011  . EDEMA 10/22/2009  . Atrial fibrillation 09/05/2009  . Acute on chronic combined systolic and diastolic heart failure 07/18/2009  . CAD 06/16/2009  . OBESITY 02/25/2009  . PREMATURE ATRIAL CONTRACTIONS 02/25/2009  . DYSPNEA 02/25/2009  . DIABETES  MELLITUS 07/28/2007  . HYPERLIPIDEMIA 07/28/2007  . HYPERTENSION 07/28/2007  . COLONIC POLYPS, ADENOMATOUS, HX OF 05/31/2007   Plan: 1. Patient much better after change back to IV Invanz.  Cont this for now. 2. Will restart clear liquids today. 3. No plans for surgery at this time.   LOS: 9 days    Maurisha Mongeau E 06/01/2013, 8:49 AM Pager: [phone]

## 2013-06-01 NOTE — Progress Notes (Signed)
ANTICOAGULATION CONSULT NOTE - Follow Up Consult  Pharmacy Consult for heparin Indication: atrial fibrillation   Labs:  Recent Labs  05/30/13 0430  05/31/13 0425 05/31/13 0544 05/31/13 1400 05/31/13 2215 06/01/13 0400  HGB 10.2*  --  10.2*  --   --   --  10.3*  HCT 32.4*  --  32.3*  --   --   --  33.1*  PLT 322  --  294  --   --   --  256  LABPROT 14.8  --   --  17.2*  --   --  23.6*  INR 1.19  --   --  1.44  --   --  2.18*  HEPARINUNFRC  --   < >  --  <0.10* <0.10* 0.31 0.31  CREATININE 0.59  --   --   --   --   --  0.53  < > = values in this interval not displayed.   Assessment 79yo female with diverticulitis with microperforation, no abscess now on IV Invanz with MD/PA report that patient is doing much better with almost no pain today She is on IV heparin drip for h/o atrial fibrillation while chronic coumadin is on hold. Today's heparin level 0.31 remains at low end of therapeutic range on rate of 1500 units/hr. CBC stable. No bleeding noted.    Goal of Therapy:  Heparin level 0.3-0.7 units/ml  Monitor platelets by anticoagulation protocol: Yes    PLAN: Will increase IV heparin slightly to 1550 units/hr since at low end of therapeutic goal, to keep heparin level within therapeutic range of 0.3-0.7 units/ml.  Follow up heparin level and CBC in AM (daily).    Noah Delaine, RPh Clinical Pharmacist Pager: [phone] 06/01/2013,9:48 AM

## 2013-06-01 NOTE — Progress Notes (Signed)
INITIAL NUTRITION ASSESSMENT  DOCUMENTATION CODES Per approved criteria  -Obesity Unspecified   INTERVENTION: Resource Breeze po TID, each supplement provides 250 kcal and 9 grams of protein.  NUTRITION DIAGNOSIS: Inadequate oral intake related to altered GI function as evidenced by prolonged NPO/clear liquid diet.   Goal: Intake to meet >90% of estimated nutrition needs.  Monitor:  Diet advancement, PO intake, labs, weight trend.  Reason for Assessment: Prolonged NPO/CL diet  79 y.o. female  Admitting Dx: Diverticulitis of colon (without mention of hemorrhage)  ASSESSMENT: Patient presented to [facility] hospital with abdominal pain. She was found to have a perforated diverticulitis by CT and subsequently transferred to [facility] per patient and family's request. Patient hospital course also complicated by A fib RVR and Heart failure exacerbation.   Patient reports that she was at a rehab facility in [place] prior to admission to the hospital. She was eating well during rehab, but has lost some weight. She has been NPO or on clear liquids since admission. Diet was briefly advanced to full liquids yesterday for breakfast, but downgraded to NPO before lunch yesterday. Diet has since been advanced to clear liquids. Patient is at nutrition risk given recent weight loss and prolonged inadequate diet.  Height: Ht Readings from Last 1 Encounters:  05/23/13 5\' 3"  (1.6 m)    Weight: Wt Readings from Last 1 Encounters:  05/31/13 181 lb 7 oz (82.3 kg)    Ideal Body Weight: 52.3 kg  % Ideal Body Weight: 157%  Wt Readings from Last 10 Encounters:  05/31/13 181 lb 7 oz (82.3 kg)  04/11/13 182 lb 15.7 oz (83 kg)  03/08/13 185 lb 6.4 oz (84.097 kg)  03/01/13 184 lb (83.462 kg)  02/07/13 189 lb (85.73 kg)  01/25/13 185 lb (83.915 kg)  12/25/12 187 lb (84.823 kg)  12/05/12 184 lb 9.6 oz (83.734 kg)  11/23/12 186 lb (84.369 kg)  10/25/12 182 lb 8 oz (82.781 kg)    Usual Body Weight: 185  lb  % Usual Body Weight: 98%  BMI:  Body mass index is 32.15 kg/(m^2). obesity, class 1  Estimated Nutritional Needs: Kcal: 1450-1650 Protein: 80-100 gm Fluid: 1.6-1.7 L  Skin: no wounds  Diet Order: Clear Liquid  EDUCATION NEEDS: -No education needs identified at this time   Intake/Output Summary (Last 24 hours) at 06/01/13 1130 Last data filed at 06/01/13 0800  Gross per 24 hour  Intake 1557.92 ml  Output   2775 ml  Net -1217.08 ml    Last BM: PTA   Labs:   Recent Labs Lab 05/27/13 0415  05/29/13 0353 05/30/13 0430 06/01/13 0400  NA 140  < > 137 140 137  K 3.6  < > 3.1* 5.1 3.7  CL 97  < > 96 101 95*  CO2 31  < > 34* 34* 36*  BUN 11  < > 14 15 6   CREATININE 0.59  < > 0.63 0.59 0.53  CALCIUM 10.3  < > 10.3 10.0 9.2  MG 1.6  --   --   --   --   GLUCOSE 215*  < > 175* 127* 234*  < > = values in this interval not displayed.  CBG (last 3)   Recent Labs  05/31/13 2334 06/01/13 0405 06/01/13 0740  GLUCAP 221* 229* 180*    Scheduled Meds: . antiseptic oral rinse  15 mL Mouth Rinse q12n4p  . azelastine  2 spray Each Nare BID  . calcitonin (salmon)  1 spray Alternating Nares Daily  .  chlorhexidine  15 mL Mouth Rinse BID  . Chlorhexidine Gluconate Cloth  6 each Topical Q0600  . cyanocobalamin  1,000 mcg Intramuscular Q30 days  . diltiazem  30 mg Oral Q6H  . ertapenem  1 g Intravenous Q24H  . furosemide  40 mg Oral BID  . insulin aspart  0-9 Units Subcutaneous Q4H  . latanoprost  1 drop Both Eyes QHS  . levothyroxine  50 mcg Oral QAC breakfast  . lidocaine  1 patch Transdermal Q24H  . mupirocin ointment   Nasal BID  . sodium chloride  10-40 mL Intracatheter Q12H  . sodium chloride  3 mL Intravenous Q12H  . sotalol  80 mg Oral Q12H    Continuous Infusions: . dextrose 5 % and 0.9% NaCl 1,000 mL (06/01/13 0438)    Past Medical History  Diagnosis Date  . Hypertension     with h/o hypertensive urgency  . Glaucoma   . Tremor, essential   .  Diabetes mellitus with neuropathy   . B12 deficiency   . Hyperlipidemia   . Atrial fibrillation     a. on chronic anticoagulation with warfarin  b. Tiksosyn discontinued and amiodarone  was discontinued secondary to perceived symptoms  . Chronic diastolic heart failure     a. echo 05/2009: mild LVH, EF 55-60%, grade 2 diast dysfxn, mild LAE, PASP;   b. Echo 3/13:   mod LVH, EF 55-60%, mild to mod calc AV annulus, very mild AS (mean 9 mmHg)  . CAD (coronary artery disease)     a.  b. 07/2011 NSTEMI - PCI/DES LCX - Resolute stent;;   b. LHC 12/27/11: mLAD 30%, prox-mid D1 90% (small);  CFX patent stent, OM3 small 95%,, mRCA 30%, EF 65%  -  med Rx;   . Carotid stenosis     a. dopplers 6/12: 1-39% bilat ICA; b. carotid dopplers 7/13: bilat 1-39% ICA  . Pulmonary fibrosis     Dr. Vassie Loll;  patient taken off O2 in 8/12; dyspnea felt CHF>>ILD  . Skin cancer   . Colon polyps   . Syncope     unknown etiology  . Bradycardia   . Pacemaker 02/29/2012  . Diabetes mellitus     insulin controled  . Elevated TSH 05/2012  . Normocytic anemia 05/2012    Past Surgical History  Procedure Laterality Date  . Appendectomy    . Nose surgery    . Colonoscopy    . Cardiac catheterization  2013    stent  . Coronary angioplasty with stent placement    . Insert / replace / remove pacemaker  02/29/2012  . Dilation and curettage of uterus      Joaquin Courts, RD, LDN, CNSC Pager [phone] After Hours Pager [phone]

## 2013-06-01 NOTE — Social Work (Signed)
Provided patient with SNF offers received thus far- she is still hopeful for return to [facility] rehab and thinks the bed is waiting for her- stressed that the list of SNF Offers would be back up options in case there is not anything at [facility] when she is medically ready for transfer-  CSW will continue to follow for further d/c planning.  Reece Levy, MSW, Theresia Majors [phone]

## 2013-06-01 NOTE — Clinical Social Work Note (Signed)
Patient has transferred to 6E. 6E CSW notified of transfer and CSW involvement with patient. This CSW signing off at this time.  Roddie Mc, [place], [place], [phone]

## 2013-06-01 NOTE — Progress Notes (Signed)
Physical Therapy Treatment Patient Details Name: Alexandria [name] MRN: [id number] DOB: February 18, 1930 Today's Date: 06/01/2013 Time: 4098-1191 PT Time Calculation (min): 24 min  PT Assessment / Plan / Recommendation  History of Present Illness Pt admitted for abdominal pain and was found to have diverticulitis and perforated bowel but no surgical intervention necessary. PT observation/assessment pt presented with R hand weakness co-ordination deficits.   PT Comments   Pt making great progress and pts seems to be self limiting at times.  Needs max encouragement & step by step cues due to anxiety.    Follow Up Recommendations  CIR (however pt desires to return to [facility] for Rehab)     Barriers to Discharge        Equipment Recommendations  None recommended by PT    Recommendations for Other Services    Frequency Min 4X/week   Progress towards PT Goals Progress towards PT goals: Progressing toward goals  Plan Current plan remains appropriate    Precautions / Restrictions Precautions Precautions: Fall Precaution Comments: back pain Restrictions Weight Bearing Restrictions: No   Pertinent Vitals/Pain C/o left hip with mobility but does not rate.     Mobility  Bed Mobility Bed Mobility: Supine to Sit;Sitting - Scoot to Edge of Bed Supine to Sit: 3: Mod assist;With rails;HOB flat Sitting - Scoot to Edge of Bed: 3: Mod assist Details for Bed Mobility Assistance: Assist for trunk elevation, pt with difficulty using R UE to assist with transfer Transfers Transfers: Sit to Stand;Stand to Sit;Stand Pivot Transfers Sit to Stand: 1: +2 Total assist;With upper extremity assist;From bed;From chair/3-in-1 Sit to Stand: Patient Percentage: 60% Stand to Sit: 1: +2 Total assist;With upper extremity assist;To chair/3-in-1 Stand to Sit: Patient Percentage: 60% Details for Transfer Assistance: (A) to initiate transfer and slowly descend to bed with cues for hand placement.  No left  lateral lean noted.   Ambulation/Gait Ambulation/Gait Assistance: 1: +2 Total assist Ambulation/Gait: Patient Percentage: 60% Ambulation Distance (Feet): 4 Feet (side steps) Assistive device: 2 person hand held assist Ambulation/Gait Assistance Details: (A) to maintain balance with cues for proper weight shifts and advance LEs Gait Pattern: Step-to pattern;Decreased stride length;Decreased weight shift to right;Decreased weight shift to left;Lateral trunk lean to left Gait velocity: slow Stairs: No    Exercises     PT Diagnosis:    PT Problem List:   PT Treatment Interventions:     PT Goals (current goals can now be found in the care plan section) Acute Rehab PT Goals Patient Stated Goal: to have my brother pick me up PT Goal Formulation: With patient Time For Goal Achievement: 06/13/13 Potential to Achieve Goals: Good  Visit Information  Last PT Received On: 06/01/13 Assistance Needed: +2 History of Present Illness: Pt admitted for abdominal pain and was found to have diverticulitis and perforated bowel but no surgical intervention necessary. PT observation/assessment pt presented with R hand weakness co-ordination deficits.    Subjective Data  Subjective: I'm not sure about standing up.  Patient Stated Goal: to have my brother pick me up   Cognition  Cognition Arousal/Alertness: Awake/alert (however pt reports being sleepy) Behavior During Therapy: Anxious Overall Cognitive Status: No family/caregiver present to determine baseline cognitive functioning (appaent cognitive) Memory: Decreased short-term memory    Balance  Balance Balance Assessed: Yes Static Sitting Balance Static Sitting - Balance Support: Bilateral upper extremity supported;Feet supported Static Sitting - Level of Assistance: 4: Min assist;5: Stand by assistance Static Sitting - Comment/# of Minutes: Initial min(A)  to maintain balance and stand by assistance  End of Session PT - End of  Session Equipment Utilized During Treatment: Gait belt Activity Tolerance: Patient limited by fatigue Patient left: in bed;with call bell/phone within reach;with family/visitor present Nurse Communication: Mobility status   GP     Alexandria [name] 06/01/2013, 2:53 PM   [name], PT DPT [phone]

## 2013-06-01 NOTE — Progress Notes (Addendum)
[facility] PROGRESS NOTE  Alexandria [name] ZOX:[id number] DOB: 1929/11/03 DOA: 05/23/2013 PCP: [name], MD  Assessment/Plan: Alexandria [name] is an 79 y.o. female with hx of afib on chronic anticoagulation, hx of B12 deficiency, essential tremor, ILD, hyperlipidemia, DM, s/p PPM who presented to [facility] hospital with abdominal pain. She was found to have a perforated diverticulitis by CT and subsequently transferred to [facility] per patient and family's request. Patient hospital course also complicated by A fib RVR and Heart failure exacerbation. She is still on Cardizem Gtt.  1. Perforated Diverticulitis:atx: INVANZ--> unasyn--> invanz; repeat CT:no significant change  -cont management per surgery; better today;  cont IV atx; gentle IVF while NPO; appreciate surgery input;   2. Hypertension: Hold Norvasc, Imdur. , prn hydralazine   3. A fib s/p PPM: IV--> PO Cardizem; Holding coumadin in case patient required surgery intervention. Started on Sotalol. 12-2. Started IV heparin, hold coumadin if need surgical intervention   4. Acute on Chronic diastolic Heart Failure; echo (8119): LVEF 55%, LVH -CXR (12/2): mild edema, L effusion; Started on oral lasix. I/O neg 4.0 L; may need gentle IVF while npo -CAD s/p PTCA in th epast; cardiology following   5. Diabetes: episode of hypoglycemia, close monitor while NPO: SSI. Hold long acting insulin   6. ILD: stable.   7. Anxiety/ delirium: ativan PRN. Improved.   8. Recent L-spine vertebral compression fracture; neuro exam is non focal; CT head: no acute findings;  -cont pain control;   TF to RNF   Code Status: full Family Communication: d/w her brother at the bedside (indicate person spoken with, relationship, and if by phone, the number) Disposition Plan: pend clinical improvement; SNF   Consultants:  Surgery Procedures:  none Antibiotics:  Ciprofloxacin 11-26---11-27  Flagyl 11-26----11-27  Invanz 11-27---12-02  Unasyn  12-02-->12/4 Invanz 12/4<<<   HPI/Subjective: alert  Objective: Filed Vitals:   06/01/13 0737  BP: 136/52  Pulse: 76  Temp: 98 F (36.7 C)  Resp: 27    Intake/Output Summary (Last 24 hours) at 06/01/13 1006 Last data filed at 06/01/13 0800  Gross per 24 hour  Intake 1570.42 ml  Output   2775 ml  Net -1204.58 ml   Filed Weights   05/23/13 1915 05/24/13 0400 05/31/13 0400  Weight: 81.5 kg (179 lb 10.8 oz) 82.4 kg (181 lb 10.5 oz) 82.3 kg (181 lb 7 oz)    Exam:   General:  alert  Cardiovascular: s1,s2 rrr  Respiratory: few crackles in ll  Abdomen: soft, nt, nd  Musculoskeletal: no edema    Data Reviewed: Basic Metabolic Panel:  Recent Labs Lab 05/27/13 0415 05/28/13 0945 05/29/13 0353 05/30/13 0430 06/01/13 0400  NA 140 136 137 140 137  K 3.6 3.6 3.1* 5.1 3.7  CL 97 96 96 101 95*  CO2 31 30 34* 34* 36*  GLUCOSE 215* 329* 175* 127* 234*  BUN 11 13 14 15 6   CREATININE 0.59 0.57 0.63 0.59 0.53  CALCIUM 10.3 10.2 10.3 10.0 9.2  MG 1.6  --   --   --   --    Liver Function Tests: No results found for this basename: AST, ALT, ALKPHOS, BILITOT, PROT, ALBUMIN,  in the last 168 hours No results found for this basename: LIPASE, AMYLASE,  in the last 168 hours No results found for this basename: AMMONIA,  in the last 168 hours CBC:  Recent Labs Lab 05/28/13 0425 05/29/13 0353 05/30/13 0430 05/31/13 0425 06/01/13 0400  WBC 9.9 10.8* 12.2*  15.6* 10.5  HGB 10.4* 10.7* 10.2* 10.2* 10.3*  HCT 31.7* 33.9* 32.4* 32.3* 33.1*  MCV 88.1 89.2 89.5 89.0 89.2  PLT 237 286 322 294 256   Cardiac Enzymes: No results found for this basename: CKTOTAL, CKMB, CKMBINDEX, TROPONINI,  in the last 168 hours BNP (last 3 results)  Recent Labs  06/24/12 1600 08/12/12 0948 05/25/13 1040  PROBNP 1253.0* 301.2 1061.0*   CBG:  Recent Labs Lab 05/31/13 1736 05/31/13 1954 05/31/13 2334 06/01/13 0405 06/01/13 0740  GLUCAP 194* 248* 221* 229* 180*    Recent  Results (from the past 240 hour(s))  MRSA PCR SCREENING     Status: Abnormal   Collection Time    05/23/13 11:50 PM      Result Value Range Status   MRSA by PCR POSITIVE (*) NEGATIVE Final   Comment:            The GeneXpert MRSA Assay (FDA     approved for NASAL specimens     only), is one component of a     comprehensive MRSA colonization     surveillance program. It is not     intended to diagnose MRSA     infection nor to guide or     monitor treatment for     MRSA infections.     RESULT CALLED TO, READ BACK BY AND VERIFIED WITH:     CALLED TO RN MELISSA HANCOCK [phone] @[phone]  [name]     Studies: Ct Angio Head W/cm &/or Wo Cm  05/30/2013   CLINICAL DATA:  Right-sided weakness.  EXAM: CT ANGIOGRAPHY HEAD  TECHNIQUE: Multidetector CT imaging of the head was performed using the standard protocol during bolus administration of intravenous contrast. Multiplanar CT image reconstructions including MIPs were obtained to evaluate the vascular anatomy.  CONTRAST:  50 mL Omnipaque 350  COMPARISON:  CT head without contrast 06/09/2013  FINDINGS: Diffuse white matter changes are again noted. The source images demonstrate no definite acute cortical infarct. Postcontrast images demonstrate no pathologic enhancement. The dural sinuses fill normally.  Dense atherosclerotic calcifications are present in the cavernous carotid arteries bilaterally with mild to moderate stenoses bilaterally, left greater than right. There is mild narrowing of the distal M1 segments bilaterally. The MCA bifurcations are intact anterior communicating artery is patent. Mild to moderate segmental narrowing is present in the distal MCA branch vessels bilaterally.  In the vertebral arteries demonstrate atherosclerotic calcifications at the dural margin bilaterally. The left vertebral artery is slightly dominant to the right. The PICA origins are visualized and within normal limits bilaterally. The basilar artery is intact. The right  posterior cerebral artery is of fetal type. There is attenuation of distal PCA branch vessels bilaterally.  Review of the MIP images confirms the above findings.  IMPRESSION: 1. No acute cortical infarct. 2. Mild-to-moderate cavernous carotid artery stenosis bilaterally, left greater than right. 3. Diffuse small vessel disease. 4. Atherosclerotic irregularity within the vertebral arteries bilaterally without significant stenosis.   Electronically Signed   By: Gennette Pac M.D.   On: 05/30/2013 19:11   Ct Head Wo Contrast  05/30/2013   CLINICAL DATA:  Right-sided weakness. Hypertension and hyperlipidemia. Atrial fibrillation. Diabetic.  EXAM: CT HEAD WITHOUT CONTRAST  TECHNIQUE: Contiguous axial images were obtained from the base of the skull through the vertex without intravenous contrast.  COMPARISON:  04/05/2013 CT.  FINDINGS: No intracranial hemorrhage.  Prominent white matter type changes without CT evidence of large acute infarct. Small acute infarct would be  difficult to exclude given the degree of white matter type changes.  Global atrophy without hydrocephalus.  Vascular calcifications.  No intracranial mass lesion noted on this unenhanced exam.  IMPRESSION: No intracranial hemorrhage.  Prominent white matter type changes without CT evidence of large acute infarct. Small acute infarct would be difficult to exclude given the degree of white matter type changes.  Global atrophy without hydrocephalus.   Electronically Signed   By: Bridgett Larsson M.D.   On: 05/30/2013 12:15   Ct Abdomen Pelvis W Contrast  05/31/2013   CLINICAL DATA:  Acute diverticulitis with microperforation. Worsening leukocytosis.  EXAM: CT ABDOMEN AND PELVIS WITH CONTRAST  TECHNIQUE: Multidetector CT imaging of the abdomen and pelvis was performed using the standard protocol following bolus administration of intravenous contrast.  CONTRAST:  80mL OMNIPAQUE IOHEXOL 300 MG/ML  SOLN  COMPARISON:  None.  FINDINGS: Images through the lung  bases show small bilateral pleural effusions bibasilar atelectasis. Diffuse body wall edema also noted.  Mild diverticulitis is seen involving the sigmoid colon. A small amount of free fluid is seen in the pelvic cul-de-sac. There is also small extraluminal gas bubbles seen in the adjacent sigmoid mesocolon. In addition, there is a small amount of free intraperitoneal air, consistent with perforation.  The liver, pancreas, adrenal glands, and kidneys are normal in appearance, except for small renal cysts. No evidence of hydronephrosis. Small ill-defined low-attenuation lesion in the spleen is likely benign. No evidence of splenomegaly. No soft tissue masses or lymphadenopathy identified. Foley catheter is seen within the bladder which is collapsed. No evidence of bowel obstruction. Old L1 vertebral body compression fracture deformity incidentally noted as well as lumbar spine degenerative changes.  IMPRESSION: Perforated sigmoid diverticulitis, with small amount of free intraperitoneal air noted.  Small amount of free fluid in pelvic cul-de-sac. No drainable abscess visualized.  Critical Value/emergent results were called by telephone at the time of interpretation on 05/31/2013 at 5:22 PM to patient's floor nurse Okey Regal, who verbally acknowledged these results.  Small bilateral pleural effusions and bibasilar atelectasis.   Electronically Signed   By: Myles Rosenthal M.D.   On: 05/31/2013 17:25   Dg Abd Portable 1v  05/31/2013   CLINICAL DATA:  Abdominal pain  EXAM: PORTABLE ABDOMEN - 1 VIEW  COMPARISON:  05/25/2013  FINDINGS: Barium remains throughout the colon, unchanged from the prior study. Negative for bowel dilatation. No significant stool in the colon. Small bowel is nondilated.  IMPRESSION: Retained contrast throughout the colon is unchanged due to poor motility. Negative for obstruction.   Electronically Signed   By: Marlan Palau M.D.   On: 05/31/2013 09:08    Scheduled Meds: . antiseptic oral rinse  15  mL Mouth Rinse q12n4p  . azelastine  2 spray Each Nare BID  . calcitonin (salmon)  1 spray Alternating Nares Daily  . chlorhexidine  15 mL Mouth Rinse BID  . Chlorhexidine Gluconate Cloth  6 each Topical Q0600  . cyanocobalamin  1,000 mcg Intramuscular Q30 days  . diltiazem  30 mg Oral Q6H  . ertapenem  1 g Intravenous Q24H  . furosemide  40 mg Oral BID  . insulin aspart  0-9 Units Subcutaneous Q4H  . latanoprost  1 drop Both Eyes QHS  . levothyroxine  50 mcg Oral QAC breakfast  . lidocaine  1 patch Transdermal Q24H  . mupirocin ointment   Nasal BID  . sodium chloride  10-40 mL Intracatheter Q12H  . sodium chloride  3 mL Intravenous Q12H  .  sotalol  80 mg Oral Q12H   Continuous Infusions: . dextrose 5 % and 0.9% NaCl 1,000 mL (06/01/13 0438)  . heparin      Principal Problem:   Diverticulitis of colon (without mention of hemorrhage) Active Problems:   DIABETES MELLITUS   HYPERLIPIDEMIA   OBESITY   HYPERTENSION   Atrial fibrillation   Acute on chronic diastolic heart failure   Occlusion and stenosis of carotid artery without mention of cerebral infarction   Compression fracture of L1 lumbar vertebra   Cardiac pacemaker in situ   ILD (interstitial lung disease)   Atrial flutter    Time spent: >35 minutes     Esperanza Sheets  [facility] Pager [phone]. If 7PM-7AM, please contact night-coverage at [email or web address], password [facility] 06/01/2013, 10:06 AM  LOS: 9 days

## 2013-06-02 LAB — PROTIME-INR
INR: 2.05 — ABNORMAL HIGH (ref 0.00–1.49)
Prothrombin Time: 22.5 seconds — ABNORMAL HIGH (ref 11.6–15.2)

## 2013-06-02 LAB — GLUCOSE, CAPILLARY
Glucose-Capillary: 249 mg/dL — ABNORMAL HIGH (ref 70–99)
Glucose-Capillary: 201 mg/dL — ABNORMAL HIGH (ref 70–99)
Glucose-Capillary: 185 mg/dL — ABNORMAL HIGH (ref 70–99)
Glucose-Capillary: 321 mg/dL — ABNORMAL HIGH (ref 70–99)

## 2013-06-02 LAB — CBC
Hemoglobin: 10 g/dL — ABNORMAL LOW (ref 12.0–15.0)
MCH: 27.6 pg (ref 26.0–34.0)
MCHC: 30.8 g/dL (ref 30.0–36.0)
Platelets: 242 10*3/uL (ref 150–400)

## 2013-06-02 LAB — HEPARIN LEVEL (UNFRACTIONATED): Heparin Unfractionated: <0.1 IU/mL — ABNORMAL LOW (ref 0.30–0.70)

## 2013-06-02 MED ORDER — INSULIN ASPART 100 UNIT/ML ~~LOC~~ SOLN
0.0000 [IU] | Freq: Three times a day (TID) | SUBCUTANEOUS | Status: DC
  Administered 2013-06-02 (×2): 7 [IU] via SUBCUTANEOUS
  Administered 2013-06-03: 9 [IU] via SUBCUTANEOUS
  Administered 2013-06-03: 3 [IU] via SUBCUTANEOUS
  Administered 2013-06-03 – 2013-06-04 (×2): 5 [IU] via SUBCUTANEOUS
  Administered 2013-06-04: 7 [IU] via SUBCUTANEOUS
  Administered 2013-06-04 – 2013-06-05 (×3): 3 [IU] via SUBCUTANEOUS
  Administered 2013-06-05: 9 [IU] via SUBCUTANEOUS
  Administered 2013-06-06: 2 [IU] via SUBCUTANEOUS

## 2013-06-02 NOTE — Progress Notes (Signed)
[facility] PROGRESS NOTE  Alexandria [name] ZOX:[id number] DOB: 07/09/1929 DOA: 05/23/2013 PCP: [name], MD  Assessment/Plan: Alexandria [name] is an 79 y.o. female with hx of afib on chronic anticoagulation, hx of B12 deficiency, essential tremor, ILD, hyperlipidemia, DM, s/p PPM who presented to [facility] hospital with abdominal pain. She was found to have a perforated diverticulitis by CT and subsequently transferred to [facility] per patient and family's request. Patient hospital course also complicated by A fib RVR and Heart failure exacerbation. She is still on Cardizem Gtt.  1. Perforated Diverticulitis:atx: INVANZ--> unasyn--> invanz; repeat CT:no significant change  -cont management per surgery; better today; cont diet; cont IV atx; appreciate surgery input;   2. Hypertension: Hold Norvasc, Imdur. prn hydralazine   3. A fib s/p PPM: IV--> PO Cardizem; Started on Sotalol. 12-2. Started IV heparin, hold coumadin if need surgical intervention   4. Acute on Chronic diastolic Heart Failure; echo (8119): LVEF 55%, LVH -CXR (12/2): mild edema, L effusion; Started on oral lasix. I/O neg 4.0 L;  -CAD s/p PTCA in th epast; cardiology following   5. Diabetes: episode of hypoglycemia, close monitor while NPO: SSI. Hold long acting insulin   6. ILD: stable.   7. Anxiety/ delirium: ativan PRN. Improved.   8. Recent L-spine vertebral compression fracture; neuro exam is non focal; CT head: no acute findings;  -cont pain control;   TF to RNF   Code Status: full Family Communication: d/w her brother at the bedside (indicate person spoken with, relationship, and if by phone, the number) Disposition Plan: pend clinical improvement; SNF   Consultants:  Surgery Procedures:  none Antibiotics:  Ciprofloxacin 11-26---11-27  Flagyl 11-26----11-27  Invanz 11-27---12-02  Unasyn 12-02-->12/4 Invanz 12/4<<<   HPI/Subjective: alert  Objective: Filed Vitals:   06/02/13 0950  BP: 145/67   Pulse: 76  Temp: 98.6 F (37 C)  Resp:     Intake/Output Summary (Last 24 hours) at 06/02/13 1120 Last data filed at 06/02/13 0951  Gross per 24 hour  Intake 2279.17 ml  Output   2927 ml  Net -647.83 ml   Filed Weights   05/24/13 0400 05/31/13 0400 06/01/13 2124  Weight: 82.4 kg (181 lb 10.5 oz) 82.3 kg (181 lb 7 oz) 81.9 kg (180 lb 8.9 oz)    Exam:   General:  alert  Cardiovascular: s1,s2 rrr  Respiratory: few crackles in ll  Abdomen: soft, nt, nd  Musculoskeletal: no edema    Data Reviewed: Basic Metabolic Panel:  Recent Labs Lab 05/27/13 0415 05/28/13 0945 05/29/13 0353 05/30/13 0430 06/01/13 0400  NA 140 136 137 140 137  K 3.6 3.6 3.1* 5.1 3.7  CL 97 96 96 101 95*  CO2 31 30 34* 34* 36*  GLUCOSE 215* 329* 175* 127* 234*  BUN 11 13 14 15 6   CREATININE 0.59 0.57 0.63 0.59 0.53  CALCIUM 10.3 10.2 10.3 10.0 9.2  MG 1.6  --   --   --   --    Liver Function Tests: No results found for this basename: AST, ALT, ALKPHOS, BILITOT, PROT, ALBUMIN,  in the last 168 hours No results found for this basename: LIPASE, AMYLASE,  in the last 168 hours No results found for this basename: AMMONIA,  in the last 168 hours CBC:  Recent Labs Lab 05/29/13 0353 05/30/13 0430 05/31/13 0425 06/01/13 0400 06/02/13 0500  WBC 10.8* 12.2* 15.6* 10.5 8.8  HGB 10.7* 10.2* 10.2* 10.3* 10.0*  HCT 33.9* 32.4* 32.3* 33.1* 32.5*  MCV 89.2 89.5 89.0 89.2 89.8  PLT 286 322 294 256 242   Cardiac Enzymes: No results found for this basename: CKTOTAL, CKMB, CKMBINDEX, TROPONINI,  in the last 168 hours BNP (last 3 results)  Recent Labs  06/24/12 1600 08/12/12 0948 05/25/13 1040  PROBNP 1253.0* 301.2 1061.0*   CBG:  Recent Labs Lab 06/01/13 1414 06/01/13 1747 06/01/13 2019 06/02/13 0029 06/02/13 0422  GLUCAP 310* 366* 266* 249* 201*    Recent Results (from the past 240 hour(s))  MRSA PCR SCREENING     Status: Abnormal   Collection Time    05/23/13 11:50 PM       Result Value Range Status   MRSA by PCR POSITIVE (*) NEGATIVE Final   Comment:            The GeneXpert MRSA Assay (FDA     approved for NASAL specimens     only), is one component of a     comprehensive MRSA colonization     surveillance program. It is not     intended to diagnose MRSA     infection nor to guide or     monitor treatment for     MRSA infections.     RESULT CALLED TO, READ BACK BY AND VERIFIED WITH:     CALLED TO RN MELISSA HANCOCK [phone] @0605  [name]     Studies: Ct Abdomen Pelvis W Contrast  05/31/2013   CLINICAL DATA:  Acute diverticulitis with microperforation. Worsening leukocytosis.  EXAM: CT ABDOMEN AND PELVIS WITH CONTRAST  TECHNIQUE: Multidetector CT imaging of the abdomen and pelvis was performed using the standard protocol following bolus administration of intravenous contrast.  CONTRAST:  80mL OMNIPAQUE IOHEXOL 300 MG/ML  SOLN  COMPARISON:  None.  FINDINGS: Images through the lung bases show small bilateral pleural effusions bibasilar atelectasis. Diffuse body wall edema also noted.  Mild diverticulitis is seen involving the sigmoid colon. A small amount of free fluid is seen in the pelvic cul-de-sac. There is also small extraluminal gas bubbles seen in the adjacent sigmoid mesocolon. In addition, there is a small amount of free intraperitoneal air, consistent with perforation.  The liver, pancreas, adrenal glands, and kidneys are normal in appearance, except for small renal cysts. No evidence of hydronephrosis. Small ill-defined low-attenuation lesion in the spleen is likely benign. No evidence of splenomegaly. No soft tissue masses or lymphadenopathy identified. Foley catheter is seen within the bladder which is collapsed. No evidence of bowel obstruction. Old L1 vertebral body compression fracture deformity incidentally noted as well as lumbar spine degenerative changes.  IMPRESSION: Perforated sigmoid diverticulitis, with small amount of free intraperitoneal air  noted.  Small amount of free fluid in pelvic cul-de-sac. No drainable abscess visualized.  Critical Value/emergent results were called by telephone at the time of interpretation on 05/31/2013 at 5:22 PM to patient's floor nurse Okey Regal, who verbally acknowledged these results.  Small bilateral pleural effusions and bibasilar atelectasis.   Electronically Signed   By: Myles Rosenthal M.D.   On: 05/31/2013 17:25    Scheduled Meds: . antiseptic oral rinse  15 mL Mouth Rinse q12n4p  . azelastine  2 spray Each Nare BID  . calcitonin (salmon)  1 spray Alternating Nares Daily  . chlorhexidine  15 mL Mouth Rinse BID  . Chlorhexidine Gluconate Cloth  6 each Topical Q0600  . cyanocobalamin  1,000 mcg Intramuscular Q30 days  . diltiazem  30 mg Oral Q6H  . ertapenem  1 g Intravenous Q24H  .  feeding supplement (RESOURCE BREEZE)  1 Container Oral TID BM  . furosemide  40 mg Oral BID  . insulin aspart  0-9 Units Subcutaneous Q4H  . latanoprost  1 drop Both Eyes QHS  . levothyroxine  50 mcg Oral QAC breakfast  . lidocaine  1 patch Transdermal Q24H  . mupirocin ointment   Nasal BID  . sodium chloride  10-40 mL Intracatheter Q12H  . sodium chloride  3 mL Intravenous Q12H  . sotalol  80 mg Oral Q12H   Continuous Infusions: . dextrose 5 % and 0.9% NaCl 50 mL/hr at 06/02/13 0047    Principal Problem:   Diverticulitis of colon (without mention of hemorrhage) Active Problems:   DIABETES MELLITUS   HYPERLIPIDEMIA   OBESITY   HYPERTENSION   Atrial fibrillation   Acute on chronic diastolic heart failure   Occlusion and stenosis of carotid artery without mention of cerebral infarction   Compression fracture of L1 lumbar vertebra   Cardiac pacemaker in situ   ILD (interstitial lung disease)   Atrial flutter    Time spent: >35 minutes     Esperanza Sheets  [facility] Pager [phone]. If 7PM-7AM, please contact night-coverage at [email or web address], password [facility] 06/02/2013, 11:20 AM  LOS: 10 days

## 2013-06-02 NOTE — Progress Notes (Signed)
Paged MD on call for Dr.Hochrein,awaiting call back. 01:53 Second page made and spoke with Dr. Jacalyn Lefevre made aware of EKG result.No orders received at this time. Kisha Messman Joselita,RN

## 2013-06-02 NOTE — Progress Notes (Signed)
Result of EKG relayed to T. Callhan,NP.Advised  By NP to call Cardiology. Alexandria [name] Joselita,RN

## 2013-06-02 NOTE — Progress Notes (Signed)
Subjective: Pt feels better   Objective: Vital signs in last 24 hours: Temp:  [97.4 F (36.3 C)-99.1 F (37.3 C)] 98.6 F (37 C) (12/06 0950) Pulse Rate:  [61-76] 76 (12/06 0950) Resp:  [17-18] 18 (12/06 0429) BP: (123-147)/(40-68) 145/67 mmHg (12/06 0950) SpO2:  [94 %-95 %] 95 % (12/06 0950) Weight:  [180 lb 8.9 oz (81.9 kg)] 180 lb 8.9 oz (81.9 kg) (12/05 2124) Last BM Date:  (PTA)  Intake/Output from previous day: 12/05 0701 - 12/06 0700 In: 2625.7 [P.O.:480; I.V.:2095.7; IV Piggyback:50] Out: 2801 [Urine:2800; Stool:1] Intake/Output this shift: Total I/O In: 600 [P.O.:600] Out: 251 [Urine:250; Stool:1]  General appearance: alert and cooperative GI: soft, non-tender; bowel sounds normal; no masses,  no organomegaly  Lab Results:   Recent Labs  06/01/13 0400 06/02/13 0500  WBC 10.5 8.8  HGB 10.3* 10.0*  HCT 33.1* 32.5*  PLT 256 242   BMET  Recent Labs  06/01/13 0400  NA 137  K 3.7  CL 95*  CO2 36*  GLUCOSE 234*  BUN 6  CREATININE 0.53  CALCIUM 9.2   PT/INR  Recent Labs  06/01/13 0400 06/02/13 0500  LABPROT 23.6* 22.5*  INR 2.18* 2.05*   ABG No results found for this basename: PHART, PCO2, PO2, HCO3,  in the last 72 hours  Studies/Results: Ct Abdomen Pelvis W Contrast  05/31/2013   CLINICAL DATA:  Acute diverticulitis with microperforation. Worsening leukocytosis.  EXAM: CT ABDOMEN AND PELVIS WITH CONTRAST  TECHNIQUE: Multidetector CT imaging of the abdomen and pelvis was performed using the standard protocol following bolus administration of intravenous contrast.  CONTRAST:  80mL OMNIPAQUE IOHEXOL 300 MG/ML  SOLN  COMPARISON:  None.  FINDINGS: Images through the lung bases show small bilateral pleural effusions bibasilar atelectasis. Diffuse body wall edema also noted.  Mild diverticulitis is seen involving the sigmoid colon. A small amount of free fluid is seen in the pelvic cul-de-sac. There is also small extraluminal gas bubbles seen in the  adjacent sigmoid mesocolon. In addition, there is a small amount of free intraperitoneal air, consistent with perforation.  The liver, pancreas, adrenal glands, and kidneys are normal in appearance, except for small renal cysts. No evidence of hydronephrosis. Small ill-defined low-attenuation lesion in the spleen is likely benign. No evidence of splenomegaly. No soft tissue masses or lymphadenopathy identified. Foley catheter is seen within the bladder which is collapsed. No evidence of bowel obstruction. Old L1 vertebral body compression fracture deformity incidentally noted as well as lumbar spine degenerative changes.  IMPRESSION: Perforated sigmoid diverticulitis, with small amount of free intraperitoneal air noted.  Small amount of free fluid in pelvic cul-de-sac. No drainable abscess visualized.  Critical Value/emergent results were called by telephone at the time of interpretation on 05/31/2013 at 5:22 PM to patient's floor nurse Okey Regal, who verbally acknowledged these results.  Small bilateral pleural effusions and bibasilar atelectasis.   Electronically Signed   By: Myles Rosenthal M.D.   On: 05/31/2013 17:25    Anti-infectives: Anti-infectives   Start     Dose/Rate Route Frequency Ordered Stop   05/31/13 1100  ertapenem (INVANZ) 1 g in sodium chloride 0.9 % 50 mL IVPB     1 g 100 mL/hr over 30 Minutes Intravenous Every 24 hours 05/31/13 0947     05/29/13 2200  Ampicillin-Sulbactam (UNASYN) 3 g in sodium chloride 0.9 % 100 mL IVPB  Status:  Discontinued    Comments:  Switch from Invanz to unasyn   3 g 100 mL/hr over  60 Minutes Intravenous Every 6 hours 05/29/13 1943 05/31/13 0947   05/29/13 1800  Ampicillin-Sulbactam (UNASYN) 3 g in sodium chloride 0.9 % 100 mL IVPB  Status:  Discontinued    Comments:  Switch from Invanz to unasyn   3 g 100 mL/hr over 60 Minutes Intravenous Every 6 hours 05/29/13 1630 05/29/13 1943   05/29/13 1030  Ampicillin-Sulbactam (UNASYN) 3 g in sodium chloride 0.9 % 100 mL  IVPB  Status:  Discontinued    Comments:  Switch from Invanz to unasyn   3 g 100 mL/hr over 60 Minutes Intravenous Every 6 hours 05/29/13 0926 05/29/13 1630   05/25/13 1130  ertapenem (INVANZ) 1 g in sodium chloride 0.9 % 50 mL IVPB  Status:  Discontinued     1 g 100 mL/hr over 30 Minutes Intravenous Every 24 hours 05/25/13 1126 05/29/13 0926   05/23/13 2200  metroNIDAZOLE (FLAGYL) IVPB 500 mg  Status:  Discontinued     500 mg 100 mL/hr over 60 Minutes Intravenous Every 8 hours 05/23/13 2140 05/25/13 1126   05/23/13 2145  ciprofloxacin (CIPRO) IVPB 400 mg  Status:  Discontinued     400 mg 200 mL/hr over 60 Minutes Intravenous Every 12 hours 05/23/13 2140 05/25/13 1126      Assessment/Plan: Diverticulitis  Ok to [employer] to [employer] Con't abx  LOS: 10 days    Marigene Ehlers., Jed Limerick 06/02/2013

## 2013-06-02 NOTE — Progress Notes (Signed)
Hx: Alexandria [name] is an 79 y.o. female with hx of afib on chronic anticoagulation, hx of B12 deficiency, essential tremor, ILD, hyperlipidemia, DM, s/p PPM who presented to [facility] hospital with abdominal pain. She was found to have a perforated diverticulitis by CT and subsequently transferred to [facility] per patient and family's request. Patient hospital course also complicated by A fib RVR and Heart failure exacerbation.    SUBJECTIVE:  She denies any pain.  She is very weak but better than yesterday.     PHYSICAL EXAM Filed Vitals:   06/01/13 2121 06/01/13 2124 06/02/13 0024 06/02/13 0429  BP: 136/65  127/40 147/68  Pulse: 72  61 65  Temp: 98.2 F (36.8 C)   97.4 F (36.3 C)  TempSrc: Oral   Oral  Resp: 17   18  Height:      Weight:  180 lb 8.9 oz (81.9 kg)    SpO2: 95%   95%   General:  No distress Lungs:  Clear anteriorly.  Heart:  RR Abdomen:   + BS , mildly distended, no significant tenderness Extremities:  No edema  LABS:  Results for orders placed during the hospital encounter of 05/23/13 (from the past 24 hour(s))  GLUCOSE, CAPILLARY     Status: Abnormal   Collection Time    06/01/13 11:40 AM      Result Value Range   Glucose-Capillary 144 (*) 70 - 99 mg/dL   Comment 1 Notify RN     Comment 2 Documented in Chart    GLUCOSE, CAPILLARY     Status: Abnormal   Collection Time    06/01/13  2:14 PM      Result Value Range   Glucose-Capillary 310 (*) 70 - 99 mg/dL   Comment 1 Documented in Chart     Comment 2 Notify RN    GLUCOSE, CAPILLARY     Status: Abnormal   Collection Time    06/01/13  5:47 PM      Result Value Range   Glucose-Capillary 366 (*) 70 - 99 mg/dL  GLUCOSE, CAPILLARY     Status: Abnormal   Collection Time    06/01/13  8:19 PM      Result Value Range   Glucose-Capillary 266 (*) 70 - 99 mg/dL  GLUCOSE, CAPILLARY     Status: Abnormal   Collection Time    06/02/13 12:29 AM      Result Value Range   Glucose-Capillary 249 (*) 70 - 99 mg/dL    GLUCOSE, CAPILLARY     Status: Abnormal   Collection Time    06/02/13  4:22 AM      Result Value Range   Glucose-Capillary 201 (*) 70 - 99 mg/dL  PROTIME-INR     Status: Abnormal   Collection Time    06/02/13  5:00 AM      Result Value Range   Prothrombin Time 22.5 (*) 11.6 - 15.2 seconds   INR 2.05 (*) 0.00 - 1.49  HEPARIN LEVEL (UNFRACTIONATED)     Status: Abnormal   Collection Time    06/02/13  5:00 AM      Result Value Range   Heparin Unfractionated <0.10 (*) 0.30 - 0.70 IU/mL  CBC     Status: Abnormal   Collection Time    06/02/13  5:00 AM      Result Value Range   WBC 8.8  4.0 - 10.5 K/uL   RBC 3.62 (*) 3.87 - 5.11 MIL/uL   Hemoglobin 10.0 (*)  12.0 - 15.0 g/dL   HCT 40.9 (*) 81.1 - 91.4 %   MCV 89.8  78.0 - 100.0 fL   MCH 27.6  26.0 - 34.0 pg   MCHC 30.8  30.0 - 36.0 g/dL   RDW 78.2  95.6 - 21.3 %   Platelets 242  150 - 400 K/uL    Intake/Output Summary (Last 24 hours) at 06/02/13 0851 Last data filed at 06/02/13 0865  Gross per 24 hour  Intake 1724.17 ml  Output   2676 ml  Net -951.83 ml     ASSESSMENT AND PLAN:  ATRIAL FIB:   On Sotalol and heparin.  Now in NSR.  Continue PO Cardizem.  Start warfarin when OK with primary team/surgery.      .   ACUTE ON CHRONIC DIASTOLIC HF:   She seems to be euvolemic.      Vesta Mixer, Montez Hageman., MD, [facility] 06/02/2013, 8:58 AM Office - [phone] Pager [phone]

## 2013-06-03 DIAGNOSIS — Z950 Presence of cardiac pacemaker: Secondary | ICD-10-CM

## 2013-06-03 LAB — CBC
HCT: 33.4 % — ABNORMAL LOW (ref 36.0–46.0)
MCH: 28 pg (ref 26.0–34.0)
MCHC: 31.1 g/dL (ref 30.0–36.0)
MCV: 89.8 fL (ref 78.0–100.0)
Platelets: 242 10*3/uL (ref 150–400)
RDW: 13.8 % (ref 11.5–15.5)
WBC: 9.2 10*3/uL (ref 4.0–10.5)

## 2013-06-03 LAB — GLUCOSE, CAPILLARY
Glucose-Capillary: 276 mg/dL — ABNORMAL HIGH (ref 70–99)
Glucose-Capillary: 234 mg/dL — ABNORMAL HIGH (ref 70–99)
Glucose-Capillary: 262 mg/dL — ABNORMAL HIGH (ref 70–99)

## 2013-06-03 MED ORDER — DILTIAZEM HCL ER COATED BEADS 240 MG PO CP24
240.0000 mg | ORAL_CAPSULE | Freq: Every day | ORAL | Status: DC
  Filled 2013-06-03: qty 1

## 2013-06-03 MED ORDER — WARFARIN SODIUM 4 MG PO TABS
4.0000 mg | ORAL_TABLET | Freq: Once | ORAL | Status: AC
  Administered 2013-06-03: 4 mg via ORAL
  Filled 2013-06-03: qty 1

## 2013-06-03 MED ORDER — FUROSEMIDE 10 MG/ML IJ SOLN
40.0000 mg | Freq: Once | INTRAMUSCULAR | Status: AC
  Administered 2013-06-03: 40 mg via INTRAVENOUS
  Filled 2013-06-03: qty 4

## 2013-06-03 MED ORDER — INSULIN GLARGINE 100 UNIT/ML ~~LOC~~ SOLN
8.0000 [IU] | Freq: Every day | SUBCUTANEOUS | Status: DC
  Administered 2013-06-03: 8 [IU] via SUBCUTANEOUS
  Filled 2013-06-03 (×2): qty 0.08

## 2013-06-03 MED ORDER — WARFARIN - PHARMACIST DOSING INPATIENT: Freq: Every day | Status: DC

## 2013-06-03 MED ORDER — AMLODIPINE BESYLATE 5 MG PO TABS
5.0000 mg | ORAL_TABLET | Freq: Every day | ORAL | Status: DC
  Administered 2013-06-03: 5 mg via ORAL
  Filled 2013-06-03 (×2): qty 1

## 2013-06-03 NOTE — Progress Notes (Signed)
  Subjective: Denies abdominal pain or fevers.  Has had several bowel movements  Objective: Vital signs in last 24 hours: Temp:  [97.9 F (36.6 C)-98.4 F (36.9 C)] 98 F (36.7 C) (12/07 0910) Pulse Rate:  [73-82] 76 (12/07 0910) Resp:  [17-18] 18 (12/07 0910) BP: (134-176)/(62-77) 160/77 mmHg (12/07 0910) SpO2:  [94 %-97 %] 95 % (12/07 0910) Weight:  [180 lb 8.9 oz (81.9 kg)] 180 lb 8.9 oz (81.9 kg) (12/06 2034) Last BM Date: 06/02/13  Intake/Output from previous day: 12/06 0701 - 12/07 0700 In: 1320 [P.O.:1320] Out: 3301 [Urine:3300; Stool:1] Intake/Output this shift: Total I/O In: 120 [P.O.:120] Out: -   General appearance: alert, cooperative and no distress GI: soft, NT on exam, ND, no peritoneal signs  Lab Results:   Recent Labs  06/02/13 0500 06/03/13 0345  WBC 8.8 9.2  HGB 10.0* 10.4*  HCT 32.5* 33.4*  PLT 242 242   BMET  Recent Labs  06/01/13 0400  NA 137  K 3.7  CL 95*  CO2 36*  GLUCOSE 234*  BUN 6  CREATININE 0.53  CALCIUM 9.2   PT/INR  Recent Labs  06/02/13 0500 06/03/13 0345  LABPROT 22.5* 19.4*  INR 2.05* 1.69*   ABG No results found for this basename: PHART, PCO2, PO2, HCO3,  in the last 72 hours  Studies/Results: No results found.  Anti-infectives: Anti-infectives   Start     Dose/Rate Route Frequency Ordered Stop   05/31/13 1100  ertapenem (INVANZ) 1 g in sodium chloride 0.9 % 50 mL IVPB     1 g 100 mL/hr over 30 Minutes Intravenous Every 24 hours 05/31/13 0947     05/29/13 2200  Ampicillin-Sulbactam (UNASYN) 3 g in sodium chloride 0.9 % 100 mL IVPB  Status:  Discontinued    Comments:  Switch from Invanz to unasyn   3 g 100 mL/hr over 60 Minutes Intravenous Every 6 hours 05/29/13 1943 05/31/13 0947   05/29/13 1800  Ampicillin-Sulbactam (UNASYN) 3 g in sodium chloride 0.9 % 100 mL IVPB  Status:  Discontinued    Comments:  Switch from Invanz to unasyn   3 g 100 mL/hr over 60 Minutes Intravenous Every 6 hours 05/29/13  1630 05/29/13 1943   05/29/13 1030  Ampicillin-Sulbactam (UNASYN) 3 g in sodium chloride 0.9 % 100 mL IVPB  Status:  Discontinued    Comments:  Switch from Invanz to unasyn   3 g 100 mL/hr over 60 Minutes Intravenous Every 6 hours 05/29/13 0926 05/29/13 1630   05/25/13 1130  ertapenem (INVANZ) 1 g in sodium chloride 0.9 % 50 mL IVPB  Status:  Discontinued     1 g 100 mL/hr over 30 Minutes Intravenous Every 24 hours 05/25/13 1126 05/29/13 0926   05/23/13 2200  metroNIDAZOLE (FLAGYL) IVPB 500 mg  Status:  Discontinued     500 mg 100 mL/hr over 60 Minutes Intravenous Every 8 hours 05/23/13 2140 05/25/13 1126   05/23/13 2145  ciprofloxacin (CIPRO) IVPB 400 mg  Status:  Discontinued     400 mg 200 mL/hr over 60 Minutes Intravenous Every 12 hours 05/23/13 2140 05/25/13 1126      Assessment/Plan: s/p * No surgery found * Exam and symptoms continue to improve. should be okay to convert to oral abx and advance diet as tolerated.    LOS: 11 days    Lodema Pilot DAVID 06/03/2013

## 2013-06-03 NOTE — Progress Notes (Signed)
Hx: Alexandria [name] is an 79 y.o. female with hx of afib on chronic anticoagulation, hx of B12 deficiency, essential tremor, ILD, hyperlipidemia, DM, s/p PPM who presented to [facility] hospital with abdominal pain. She was found to have a perforated diverticulitis by CT and subsequently transferred to [facility] per patient and family's request. Patient hospital course also complicated by A fib RVR and Heart failure exacerbation.    SUBJECTIVE:  She denies any pain.  She continues to improve daily.  Her BP is running a bit high.   PHYSICAL EXAM Filed Vitals:   06/02/13 1800 06/02/13 2034 06/03/13 0039 06/03/13 0527  BP: 134/72 139/74 160/68 176/62  Pulse: 78 73 82 78  Temp: 98 F (36.7 C) 98.4 F (36.9 C)  97.9 F (36.6 C)  TempSrc: Oral Oral  Oral  Resp: 18 18  17   Height:      Weight:  180 lb 8.9 oz (81.9 kg)    SpO2: 97% 97%  94%   General:  No distress Lungs:  Clear anteriorly.  Heart:  RR Abdomen:   + BS , mildly distended, no significant tenderness Extremities:  No edema  LABS:  Results for orders placed during the hospital encounter of 05/23/13 (from the past 24 hour(s))  GLUCOSE, CAPILLARY     Status: Abnormal   Collection Time    06/02/13 11:07 AM      Result Value Range   Glucose-Capillary 321 (*) 70 - 99 mg/dL   Comment 1 Documented in Chart    GLUCOSE, CAPILLARY     Status: Abnormal   Collection Time    06/02/13  4:45 PM      Result Value Range   Glucose-Capillary 311 (*) 70 - 99 mg/dL  GLUCOSE, CAPILLARY     Status: Abnormal   Collection Time    06/02/13  8:28 PM      Result Value Range   Glucose-Capillary 300 (*) 70 - 99 mg/dL  PROTIME-INR     Status: Abnormal   Collection Time    06/03/13  3:45 AM      Result Value Range   Prothrombin Time 19.4 (*) 11.6 - 15.2 seconds   INR 1.69 (*) 0.00 - 1.49  CBC     Status: Abnormal   Collection Time    06/03/13  3:45 AM      Result Value Range   WBC 9.2  4.0 - 10.5 K/uL   RBC 3.72 (*) 3.87 - 5.11 MIL/uL   Hemoglobin 10.4 (*) 12.0 - 15.0 g/dL   HCT 16.1 (*) 09.6 - 04.5 %   MCV 89.8  78.0 - 100.0 fL   MCH 28.0  26.0 - 34.0 pg   MCHC 31.1  30.0 - 36.0 g/dL   RDW 40.9  81.1 - 91.4 %   Platelets 242  150 - 400 K/uL    Intake/Output Summary (Last 24 hours) at 06/03/13 0755 Last data filed at 06/03/13 7829  Gross per 24 hour  Intake   1320 ml  Output   3301 ml  Net  -1981 ml     ASSESSMENT AND PLAN:  1.  ATRIAL FIB:   On Sotalol and heparin.  Now in NSR.  Will DC Dilt.  Start home dose of amlodipine.  HR is OK  .  We can add back low doses of metoprolol if needed for HR control although her rate is ok now that she is in NSR.   Start warfarin when OK with primary team/surgery.      Marland Kitchen  2.  ACUTE ON CHRONIC DIASTOLIC HF:   She seems to be euvolemic.     3. HTN:  DC Dilt.  Restart home dose of Norvasc 5 mg a day.   4. CAD :  Stable,  No angina.  Consider adding metoprolol if her HR is too fast.    Vesta Mixer, Montez Hageman., MD, [facility] 06/03/2013, 7:55 AM Office - [phone] Pager [phone]

## 2013-06-03 NOTE — Progress Notes (Addendum)
[facility] PROGRESS NOTE  Alexandria [name] ZOX:[id number] DOB: 04/04/1930 DOA: 05/23/2013 PCP: [name], MD  Assessment/Plan: Alexandria [name] is an 79 y.o. female with hx of afib on chronic anticoagulation, hx of B12 deficiency, essential tremor, ILD, hyperlipidemia, DM, s/p PPM who presented to [facility] hospital with abdominal pain. She was found to have a perforated diverticulitis by CT and subsequently transferred to [facility] per patient and family's request. Patient hospital course also complicated by A fib RVR and Heart failure exacerbation. She is still on Cardizem Gtt.  1. Perforated Diverticulitis:atx: INVANZ--> unasyn--> invanz; repeat CT: no significant change  -cont management per surgery; improving; cont diet; cont IV atx; appreciate surgery input;   2. Hypertension: resumed Norvasc, Imdur. prn hydralazine   3. A fib s/p PPM: d/c IV Cardizem; Started on Sotalol. ? BB; 12/2. Started IV heparin, resume coumadin  4. Acute on Chronic diastolic Heart Failure; echo (8119): LVEF 55%, LVH -CXR (12/2): mild edema, L effusion; Started on oral lasix. I/O neg 7.0 L; mild SOB today, given extra dose of lasix x1 -CAD s/p PTCA in th epast; cardiology following, appreciate input   5. Diabetes: SSI. Resume lantus    6. ILD: stable.   7. Anxiety/ delirium: ativan PRN. Improved.   8. Recent L-spine vertebral compression fracture; neuro exam is non focal; CT head: no acute findings;  -cont pain control;    Code Status: full Family Communication: d/w her brother at the bedside (indicate person spoken with, relationship, and if by phone, the number) Disposition Plan: pend clinical improvement; SNF   Consultants:  Surgery Procedures:  none Antibiotics:  Ciprofloxacin 11-26---11-27  Flagyl 11-26----11-27  Invanz 11-27---12-02  Unasyn 12-02-->12/4 Invanz 12/4<<<   HPI/Subjective: alert  Objective: Filed Vitals:   06/03/13 0910  BP: 160/77  Pulse: 76  Temp: 98 F (36.7 C)   Resp: 18    Intake/Output Summary (Last 24 hours) at 06/03/13 0955 Last data filed at 06/03/13 0900  Gross per 24 hour  Intake    840 ml  Output   3050 ml  Net  -2210 ml   Filed Weights   05/31/13 0400 06/01/13 2124 06/02/13 2034  Weight: 82.3 kg (181 lb 7 oz) 81.9 kg (180 lb 8.9 oz) 81.9 kg (180 lb 8.9 oz)    Exam:   General:  alert  Cardiovascular: s1,s2 rrr  Respiratory: few crackles in ll  Abdomen: soft, nt, nd  Musculoskeletal: no edema    Data Reviewed: Basic Metabolic Panel:  Recent Labs Lab 05/28/13 0945 05/29/13 0353 05/30/13 0430 06/01/13 0400  NA 136 137 140 137  K 3.6 3.1* 5.1 3.7  CL 96 96 101 95*  CO2 30 34* 34* 36*  GLUCOSE 329* 175* 127* 234*  BUN 13 14 15 6   CREATININE 0.57 0.63 0.59 0.53  CALCIUM 10.2 10.3 10.0 9.2   Liver Function Tests: No results found for this basename: AST, ALT, ALKPHOS, BILITOT, PROT, ALBUMIN,  in the last 168 hours No results found for this basename: LIPASE, AMYLASE,  in the last 168 hours No results found for this basename: AMMONIA,  in the last 168 hours CBC:  Recent Labs Lab 05/30/13 0430 05/31/13 0425 06/01/13 0400 06/02/13 0500 06/03/13 0345  WBC 12.2* 15.6* 10.5 8.8 9.2  HGB 10.2* 10.2* 10.3* 10.0* 10.4*  HCT 32.4* 32.3* 33.1* 32.5* 33.4*  MCV 89.5 89.0 89.2 89.8 89.8  PLT 322 294 256 242 242   Cardiac Enzymes: No results found for this basename: CKTOTAL, CKMB, CKMBINDEX,  TROPONINI,  in the last 168 hours BNP (last 3 results)  Recent Labs  06/24/12 1600 08/12/12 0948 05/25/13 1040  PROBNP 1253.0* 301.2 1061.0*   CBG:  Recent Labs Lab 06/02/13 0749 06/02/13 1107 06/02/13 1645 06/02/13 2028 06/03/13 0815  GLUCAP 185* 321* 311* 300* 276*    No results found for this or any previous visit (from the past 240 hour(s)).   Studies: No results found.  Scheduled Meds: . amLODipine  5 mg Oral Daily  . antiseptic oral rinse  15 mL Mouth Rinse q12n4p  . azelastine  2 spray Each Nare  BID  . calcitonin (salmon)  1 spray Alternating Nares Daily  . chlorhexidine  15 mL Mouth Rinse BID  . Chlorhexidine Gluconate Cloth  6 each Topical Q0600  . cyanocobalamin  1,000 mcg Intramuscular Q30 days  . ertapenem  1 g Intravenous Q24H  . feeding supplement (RESOURCE BREEZE)  1 Container Oral TID BM  . furosemide  40 mg Oral BID  . insulin aspart  0-9 Units Subcutaneous TID WC  . latanoprost  1 drop Both Eyes QHS  . levothyroxine  50 mcg Oral QAC breakfast  . lidocaine  1 patch Transdermal Q24H  . mupirocin ointment   Nasal BID  . sodium chloride  10-40 mL Intracatheter Q12H  . sodium chloride  3 mL Intravenous Q12H  . sotalol  80 mg Oral Q12H   Continuous Infusions:    Principal Problem:   Diverticulitis of colon (without mention of hemorrhage) Active Problems:   DIABETES MELLITUS   HYPERLIPIDEMIA   OBESITY   HYPERTENSION   Atrial fibrillation   Acute on chronic diastolic heart failure   Occlusion and stenosis of carotid artery without mention of cerebral infarction   Compression fracture of L1 lumbar vertebra   Cardiac pacemaker in situ   ILD (interstitial lung disease)   Atrial flutter    Time spent: >35 minutes     Esperanza Sheets  [facility] Pager [phone]. If 7PM-7AM, please contact night-coverage at [email or web address], password [email or web address] 06/03/2013, 9:55 AM  LOS: 11 days

## 2013-06-03 NOTE — Progress Notes (Signed)
ANTICOAGULATION CONSULT NOTE - Follow Up Consult  Pharmacy Consult for Coumadin Indication: atrial fibrillation  Allergies  Allergen Reactions  . Prednisone Other (See Comments)    REACTION: "retained fluid"  . Ace Inhibitors Cough       . Crestor [Rosuvastatin Calcium] Other (See Comments)    Fatigue and leg pain   . Lipitor [Atorvastatin Calcium] Other (See Comments)    Myalgia and fatigue   . Niaspan [Niacin] Other (See Comments)    unknown  . Olmesartan Cough    Patient Measurements: Height: 5\' 3"  (160 cm) Weight: 180 lb 8.9 oz (81.9 kg) IBW/kg (Calculated) : 52.4  Vital Signs: Temp: 98 F (36.7 C) (12/07 0910) Temp src: Oral (12/07 0910) BP: 160/77 mmHg (12/07 0910) Pulse Rate: 76 (12/07 0910)  Labs:  Recent Labs  05/31/13 2215  06/01/13 0400 06/02/13 0500 06/03/13 0345  HGB  --   < > 10.3* 10.0* 10.4*  HCT  --   --  33.1* 32.5* 33.4*  PLT  --   --  256 242 242  LABPROT  --   --  23.6* 22.5* 19.4*  INR  --   --  2.18* 2.05* 1.69*  HEPARINUNFRC 0.31  --  0.31 <0.10*  --   CREATININE  --   --  0.53  --   --   < > = values in this interval not displayed.  Estimated Creatinine Clearance: 55.9 ml/min (by C-G formula based on Cr of 0.53).   Assessment: 61 YOF admitted with perforated diverticulitis, she in on chronic coumadin for afib, which has been on hold for possible procedures. Now no surgery needed, pharmacy is consulted to restart coumadin. INR 1.69 this morning, hgb 10.4, plt 242  PTA dose: 4mg  daily  Goal of Therapy:  INR 2-3 Monitor platelets by anticoagulation protocol: Yes   Plan:  Coumadin 4 mg po x 1 F/u daily INR  Bayard Hugger, PharmD, BCPS  Clinical Pharmacist  Pager: [phone]   06/03/2013,10:19 AM

## 2013-06-04 LAB — CBC
HCT: 34.6 % — ABNORMAL LOW (ref 36.0–46.0)
Hemoglobin: 11 g/dL — ABNORMAL LOW (ref 12.0–15.0)
MCH: 28.1 pg (ref 26.0–34.0)
MCHC: 31.8 g/dL (ref 30.0–36.0)
MCV: 88.3 fL (ref 78.0–100.0)
WBC: 10.2 10*3/uL (ref 4.0–10.5)

## 2013-06-04 LAB — GLUCOSE, CAPILLARY
Glucose-Capillary: 242 mg/dL — ABNORMAL HIGH (ref 70–99)
Glucose-Capillary: 373 mg/dL — ABNORMAL HIGH (ref 70–99)

## 2013-06-04 LAB — HEPARIN LEVEL (UNFRACTIONATED): Heparin Unfractionated: 0.33 IU/mL (ref 0.30–0.70)

## 2013-06-04 MED ORDER — WARFARIN SODIUM 6 MG PO TABS
6.0000 mg | ORAL_TABLET | Freq: Once | ORAL | Status: AC
  Administered 2013-06-04: 6 mg via ORAL
  Filled 2013-06-04: qty 1

## 2013-06-04 MED ORDER — AMOXICILLIN-POT CLAVULANATE 875-125 MG PO TABS
1.0000 | ORAL_TABLET | Freq: Two times a day (BID) | ORAL | Status: DC
  Administered 2013-06-04 (×2): 1 via ORAL
  Filled 2013-06-04 (×4): qty 1

## 2013-06-04 MED ORDER — INSULIN GLARGINE 100 UNIT/ML ~~LOC~~ SOLN
12.0000 [IU] | Freq: Every day | SUBCUTANEOUS | Status: DC
  Administered 2013-06-04 – 2013-06-05 (×2): 12 [IU] via SUBCUTANEOUS
  Filled 2013-06-04 (×3): qty 0.12

## 2013-06-04 MED ORDER — HEPARIN (PORCINE) IN NACL 100-0.45 UNIT/ML-% IJ SOLN
1500.0000 [IU]/h | INTRAMUSCULAR | Status: DC
  Administered 2013-06-04 – 2013-06-05 (×2): 1500 [IU]/h via INTRAVENOUS
  Filled 2013-06-04 (×3): qty 250

## 2013-06-04 MED ORDER — AMLODIPINE BESYLATE 10 MG PO TABS
10.0000 mg | ORAL_TABLET | Freq: Every day | ORAL | Status: DC
  Administered 2013-06-04 – 2013-06-06 (×3): 10 mg via ORAL
  Filled 2013-06-04 (×3): qty 1

## 2013-06-04 NOTE — Progress Notes (Addendum)
ANTICOAGULATION CONSULT NOTE - Follow Up Consult  Pharmacy Consult for coumadin and heparin Indication: atrial fibrillation  Allergies  Allergen Reactions  . Prednisone Other (See Comments)    REACTION: "retained fluid"  . Ace Inhibitors Cough       . Crestor [Rosuvastatin Calcium] Other (See Comments)    Fatigue and leg pain   . Lipitor [Atorvastatin Calcium] Other (See Comments)    Myalgia and fatigue   . Niaspan [Niacin] Other (See Comments)    unknown  . Olmesartan Cough    Patient Measurements: Height: 5\' 3"  (160 cm) Weight: 179 lb 14.4 oz (81.602 kg) IBW/kg (Calculated) : 52.4   Vital Signs: Temp: 98.6 F (37 C) (12/08 0924) BP: 125/70 mmHg (12/08 0924) Pulse Rate: 89 (12/08 0924)  Labs:  Recent Labs  06/02/13 0500 06/03/13 0345 06/04/13 0500  HGB 10.0* 10.4* 11.0*  HCT 32.5* 33.4* 34.6*  PLT 242 242 234  LABPROT 22.5* 19.4* 18.4*  INR 2.05* 1.69* 1.58*  HEPARINUNFRC <0.10*  --   --     Estimated Creatinine Clearance: 55.8 ml/min (by C-G formula based on Cr of 0.53).  Assessment: Patient is an 79 y.o. F s/p perforated diverticulitis and on coumadin PTA for Afib.  No surgical intervention is needed per [facility] with coumadin resumed last night.   Please note that heparin was started on 12/4 and stopped by Dr. Antoine Poche on 12/05 because INR was therapeutic at that time.  Heparin has been off since then.  INR is subtherapeutic at 1.58 and cont to trend down.  Per Theodore Demark,  resume heparin for patient today.   Goal of Therapy:  Heparin level 0.3-0.7 units/ml Monitor platelets by anticoagulation protocol: Yes   Plan:  1) resume heparin at 1500 units/hr 2) check 8 hr heparin level 3) coumadin 6mg  PO x1 (1.5x home dose)  Carime Dinkel P 06/04/2013,9:45 AM

## 2013-06-04 NOTE — Progress Notes (Signed)
    SUBJECTIVE:  She denies any pain.  She seems to be a little stronger and much more alert.   PHYSICAL EXAM Filed Vitals:   06/03/13 1258 06/03/13 1710 06/03/13 2046 06/04/13 0924  BP: 162/82 150/81 175/74 125/70  Pulse: 79 77 78 89  Temp: 97.8 F (36.6 C) 98 F (36.7 C) 98.2 F (36.8 C) 98.6 F (37 C)  TempSrc: Oral Oral Oral   Resp: 18 18 18 19   Height:   5\' 3"  (1.6 m)   Weight:   179 lb 14.4 oz (81.602 kg)   SpO2: 98% 95% 98% 96%   General:  No distress Lungs:  Dry crackles Heart:  Irregular Abdomen:  Positive bowel sounds Extremities:  No edema  LABS:  Results for orders placed during the hospital encounter of 05/23/13 (from the past 24 hour(s))  GLUCOSE, CAPILLARY     Status: Abnormal   Collection Time    06/03/13  4:39 PM      Result Value Range   Glucose-Capillary 366 (*) 70 - 99 mg/dL  GLUCOSE, CAPILLARY     Status: Abnormal   Collection Time    06/03/13  8:31 PM      Result Value Range   Glucose-Capillary 262 (*) 70 - 99 mg/dL  PROTIME-INR     Status: Abnormal   Collection Time    06/04/13  5:00 AM      Result Value Range   Prothrombin Time 18.4 (*) 11.6 - 15.2 seconds   INR 1.58 (*) 0.00 - 1.49  CBC     Status: Abnormal   Collection Time    06/04/13  5:00 AM      Result Value Range   WBC 10.2  4.0 - 10.5 K/uL   RBC 3.92  3.87 - 5.11 MIL/uL   Hemoglobin 11.0 (*) 12.0 - 15.0 g/dL   HCT 16.1 (*) 09.6 - 04.5 %   MCV 88.3  78.0 - 100.0 fL   MCH 28.1  26.0 - 34.0 pg   MCHC 31.8  30.0 - 36.0 g/dL   RDW 40.9  81.1 - 91.4 %   Platelets 234  150 - 400 K/uL  GLUCOSE, CAPILLARY     Status: Abnormal   Collection Time    06/04/13  7:20 AM      Result Value Range   Glucose-Capillary 268 (*) 70 - 99 mg/dL  GLUCOSE, CAPILLARY     Status: Abnormal   Collection Time    06/04/13 11:17 AM      Result Value Range   Glucose-Capillary 242 (*) 70 - 99 mg/dL    Intake/Output Summary (Last 24 hours) at 06/04/13 1212 Last data filed at 06/04/13 7829  Gross per  24 hour  Intake   2085 ml  Output      0 ml  Net   2085 ml     ASSESSMENT AND PLAN:  ATRIAL FIB:   On Sotalol and heparin.  Now on warfarin per pharmacy.  NSR.  Continue current therapy.    ACUTE ON CHRONIC DIASTOLIC HF:   She seems to be euvolemic.      We will follow as needed.      Fayrene Fearing Ahmir Bracken 06/04/2013 12:12 PM

## 2013-06-04 NOTE — Progress Notes (Signed)
Patient ID: Alexandria [name], female   DOB: May 01, 1930, 79 y.o.   MRN: [id number]    Subjective: Pt feels weak this morning, but overall improving.  Had some nausea last night, but resolved this morning.  Objective: Vital signs in last 24 hours: Temp:  [97.8 F (36.6 C)-98.6 F (37 C)] 98.6 F (37 C) (12/08 0924) Pulse Rate:  [77-89] 89 (12/08 0924) Resp:  [18-19] 19 (12/08 0924) BP: (125-175)/(70-82) 125/70 mmHg (12/08 0924) SpO2:  [95 %-98 %] 96 % (12/08 0924) Weight:  [179 lb 14.4 oz (81.602 kg)] 179 lb 14.4 oz (81.602 kg) (12/07 2046) Last BM Date: 06/02/13  Intake/Output from previous day: 12/07 0701 - 12/08 0700 In: 2375 [P.O.:760; IV Piggyback:50] Out: 675 [Urine:675] Intake/Output this shift:    PE: Abd: soft, essentially NT, Nd, +BS  Lab Results:   Recent Labs  06/03/13 0345 06/04/13 0500  WBC 9.2 10.2  HGB 10.4* 11.0*  HCT 33.4* 34.6*  PLT 242 234   BMET No results found for this basename: NA, K, CL, CO2, GLUCOSE, BUN, CREATININE, CALCIUM,  in the last 72 hours PT/INR  Recent Labs  06/03/13 0345 06/04/13 0500  LABPROT 19.4* 18.4*  INR 1.69* 1.58*   CMP     Component Value Date/Time   NA 137 06/01/2013 0400   NA 141 03/08/2013 1221   K 3.7 06/01/2013 0400   CL 95* 06/01/2013 0400   CO2 36* 06/01/2013 0400   GLUCOSE 234* 06/01/2013 0400   GLUCOSE 160* 03/08/2013 1221   BUN 6 06/01/2013 0400   BUN 20 03/08/2013 1221   CREATININE 0.53 06/01/2013 0400   CREATININE 0.96 12/05/2012 1251   CALCIUM 9.2 06/01/2013 0400   PROT 6.6 05/24/2013 0525   PROT 7.1 03/08/2013 1221   ALBUMIN 2.6* 05/24/2013 0525   AST 10 05/24/2013 0525   ALT 5 05/24/2013 0525   ALKPHOS 81 05/24/2013 0525   BILITOT 0.5 05/24/2013 0525   GFRNONAA 87* 06/01/2013 0400   GFRAA >90 06/01/2013 0400   Lipase     Component Value Date/Time   LIPASE 32 08/12/2012 0948       Studies/Results: No results found.  Anti-infectives: Anti-infectives   Start     Dose/Rate Route Frequency  Ordered Stop   06/04/13 1000  amoxicillin-clavulanate (AUGMENTIN) 875-125 MG per tablet 1 tablet     1 tablet Oral Every 12 hours 06/04/13 0845     05/31/13 1100  ertapenem (INVANZ) 1 g in sodium chloride 0.9 % 50 mL IVPB  Status:  Discontinued     1 g 100 mL/hr over 30 Minutes Intravenous Every 24 hours 05/31/13 0947 06/04/13 0845   05/29/13 2200  Ampicillin-Sulbactam (UNASYN) 3 g in sodium chloride 0.9 % 100 mL IVPB  Status:  Discontinued    Comments:  Switch from Invanz to unasyn   3 g 100 mL/hr over 60 Minutes Intravenous Every 6 hours 05/29/13 1943 05/31/13 0947   05/29/13 1800  Ampicillin-Sulbactam (UNASYN) 3 g in sodium chloride 0.9 % 100 mL IVPB  Status:  Discontinued    Comments:  Switch from Invanz to unasyn   3 g 100 mL/hr over 60 Minutes Intravenous Every 6 hours 05/29/13 1630 05/29/13 1943   05/29/13 1030  Ampicillin-Sulbactam (UNASYN) 3 g in sodium chloride 0.9 % 100 mL IVPB  Status:  Discontinued    Comments:  Switch from Invanz to unasyn   3 g 100 mL/hr over 60 Minutes Intravenous Every 6 hours 05/29/13 0926 05/29/13 1630   05/25/13  1130  ertapenem (INVANZ) 1 g in sodium chloride 0.9 % 50 mL IVPB  Status:  Discontinued     1 g 100 mL/hr over 30 Minutes Intravenous Every 24 hours 05/25/13 1126 05/29/13 0926   05/23/13 2200  metroNIDAZOLE (FLAGYL) IVPB 500 mg  Status:  Discontinued     500 mg 100 mL/hr over 60 Minutes Intravenous Every 8 hours 05/23/13 2140 05/25/13 1126   05/23/13 2145  ciprofloxacin (CIPRO) IVPB 400 mg  Status:  Discontinued     400 mg 200 mL/hr over 60 Minutes Intravenous Every 12 hours 05/23/13 2140 05/25/13 1126       Assessment/Plan  1. Diverticulitis with  Microperforation Patient Active Problem List   Diagnosis Date Noted  . Atrial flutter 05/25/2013  . Diverticulitis of colon (without mention of hemorrhage) 05/23/2013  . Cardiac pacemaker in situ 05/23/2013  . ILD (interstitial lung disease) 05/23/2013  . Diverticulitis 05/23/2013  .  Compression fracture of L1 lumbar vertebra 04/06/2013  . Afib 09/13/2012  . Vertigo 08/12/2012  . Pacemaker-[employer] 03/06/2012  . Chronic diastolic CHF (congestive heart failure) 01/23/2012  . Occlusion and stenosis of carotid artery without mention of cerebral infarction 01/19/2012  . Tachycardia-bradycardia syndrome 10/10/2011  . Hypomagnesemia 09/01/2011  . Weakness generalized 08/31/2011  . Leukocytosis 08/31/2011  . UTI (lower urinary tract infection) 08/17/2011  . Acute on chronic diastolic heart failure 05/20/2011  . EDEMA 10/22/2009  . Atrial fibrillation 09/05/2009  . Acute on chronic combined systolic and diastolic heart failure 07/18/2009  . CAD 06/16/2009  . OBESITY 02/25/2009  . PREMATURE ATRIAL CONTRACTIONS 02/25/2009  . DYSPNEA 02/25/2009  . DIABETES MELLITUS 07/28/2007  . HYPERLIPIDEMIA 07/28/2007  . HYPERTENSION 07/28/2007  . COLONIC POLYPS, ADENOMATOUS, HX OF 05/31/2007   Plan: 1. Low fiber diet today and transition to oral augmentin from Iv Invanz.  If the patient does well with this, she will be stable for dc home from our standpoint. 2. She should have 10-14 days more of oral abx therapy 3. Follow up with Dr. Derrell Lolling in 3 weeks.  LOS: 12 days    Locklan Canoy E 06/04/2013, 9:54 AM Pager: [phone]

## 2013-06-04 NOTE — Clinical Social Work Note (Signed)
CSW contacted Victorino Dike [phone]) with [facility] regarding patient returning there at discharge. Victorino Dike requested additional clinicals be sent to them. Information transmitted to [facility] [phone]) and per Victorino Dike, they will review and follow-up with CSW Tuesday, 12/9.  Genelle Bal, MSW, LCSW [phone]

## 2013-06-04 NOTE — Progress Notes (Signed)
ANTICOAGULATION CONSULT NOTE: HEPARIN  Indication: atrial fibrillation  Heparin level = 0.33 on 1500 units/hr Goal heparin level = 0.3-0.7  Heparin level is in the desired therapeutic range. No changes necessary.  Cardell Peach, PharmD

## 2013-06-04 NOTE — Progress Notes (Addendum)
[facility] PROGRESS NOTE  Alexandria [name] ZHY:[id number] DOB: [birth year] DOA: 05/23/2013 PCP: [name], MD  Assessment/Plan: Alexandria [name] is an 79 y.o. female with hx of afib on chronic anticoagulation, hx of B12 deficiency, essential tremor, ILD, hyperlipidemia, DM, s/p PPM who presented to [facility] hospital with abdominal pain. She was found to have a perforated diverticulitis by CT and subsequently transferred to [facility] per patient and family's request. Patient hospital course also complicated by A fib RVR and Heart failure exacerbation. She is still on Cardizem Gtt.  1. Perforated Diverticulitis:atx: ; repeat CT: no significant change  -cont management per surgery; improving; cont diet;  IV atx (INVANZ--> unasyn--> invanz--> Augmentin on 12/8); appreciate surgery input;   2. Hypertension: resumed Norvasc increased to 10 mg, Imdur. prn hydralazine   3. A fib s/p PPM: d/c IV Cardizem; Started on Sotalol. ? BB; 12/2. Started IV heparin, resume coumadin  4. Acute on Chronic diastolic Heart Failure; echo (2952): LVEF 55%, LVH -CXR (12/2): mild edema, L effusion; cont oral lasix. I/O neg ;  -CAD s/p PTCA in the past; cardiology following, appreciate input   5. Diabetes: SSI. Resume lantus increase to 12 units    6. ILD: stable.   7. Anxiety/ delirium: ativan PRN. Improved.   8. Recent L-spine vertebral compression fracture; neuro exam is non focal; CT head: no acute findings;  -cont pain control;   D/c plans SNF in 24-48 hours if okay with surgery, cards    Code Status: full Family Communication: d/w her brother at the bedside (indicate person spoken with, relationship, and if by phone, the number) Disposition Plan: pend clinical improvement; SNF   Consultants:  Surgery Procedures:  none Antibiotics:  Ciprofloxacin 11-26---11-27  Flagyl 11-26----11-27  Invanz 11-27---12-02  Unasyn 12-02-->12/4 Invanz 12/4<<<12/8 Augmenti  12/8<<<<   HPI/Subjective: alert  Objective: Filed Vitals:   06/03/13 2046  BP: 175/74  Pulse: 78  Temp: 98.2 F (36.8 C)  Resp: 18    Intake/Output Summary (Last 24 hours) at 06/04/13 0846 Last data filed at 06/03/13 1800  Gross per 24 hour  Intake   2375 ml  Output    675 ml  Net   1700 ml   Filed Weights   06/01/13 2124 06/02/13 2034 06/03/13 2046  Weight: 81.9 kg (180 lb 8.9 oz) 81.9 kg (180 lb 8.9 oz) 81.602 kg (179 lb 14.4 oz)    Exam:   General:  alert  Cardiovascular: s1,s2 rrr  Respiratory: few crackles in ll  Abdomen: soft, nt, nd  Musculoskeletal: no edema    Data Reviewed: Basic Metabolic Panel:  Recent Labs Lab 05/28/13 0945 05/29/13 0353 05/30/13 0430 06/01/13 0400  NA 136 137 140 137  K 3.6 3.1* 5.1 3.7  CL 96 96 101 95*  CO2 30 34* 34* 36*  GLUCOSE 329* 175* 127* 234*  BUN 13 14 15 6   CREATININE 0.57 0.63 0.59 0.53  CALCIUM 10.2 10.3 10.0 9.2   Liver Function Tests: No results found for this basename: AST, ALT, ALKPHOS, BILITOT, PROT, ALBUMIN,  in the last 168 hours No results found for this basename: LIPASE, AMYLASE,  in the last 168 hours No results found for this basename: AMMONIA,  in the last 168 hours CBC:  Recent Labs Lab 05/31/13 0425 06/01/13 0400 06/02/13 0500 06/03/13 0345 06/04/13 0500  WBC 15.6* 10.5 8.8 9.2 10.2  HGB 10.2* 10.3* 10.0* 10.4* 11.0*  HCT 32.3* 33.1* 32.5* 33.4* 34.6*  MCV 89.0 89.2 89.8 89.8 88.3  PLT  294 256 242 242 234   Cardiac Enzymes: No results found for this basename: CKTOTAL, CKMB, CKMBINDEX, TROPONINI,  in the last 168 hours BNP (last 3 results)  Recent Labs  06/24/12 1600 08/12/12 0948 05/25/13 1040  PROBNP 1253.0* 301.2 1061.0*   CBG:  Recent Labs Lab 06/03/13 0815 06/03/13 1155 06/03/13 1639 06/03/13 2031 06/04/13 0720  GLUCAP 276* 234* 366* 262* 268*    No results found for this or any previous visit (from the past 240 hour(s)).   Studies: No results  found.  Scheduled Meds: . amLODipine  10 mg Oral Daily  . amoxicillin-clavulanate  1 tablet Oral Q12H  . antiseptic oral rinse  15 mL Mouth Rinse q12n4p  . azelastine  2 spray Each Nare BID  . calcitonin (salmon)  1 spray Alternating Nares Daily  . chlorhexidine  15 mL Mouth Rinse BID  . Chlorhexidine Gluconate Cloth  6 each Topical Q0600  . cyanocobalamin  1,000 mcg Intramuscular Q30 days  . feeding supplement (RESOURCE BREEZE)  1 Container Oral TID BM  . furosemide  40 mg Oral BID  . insulin aspart  0-9 Units Subcutaneous TID WC  . insulin glargine  8 Units Subcutaneous QHS  . latanoprost  1 drop Both Eyes QHS  . levothyroxine  50 mcg Oral QAC breakfast  . lidocaine  1 patch Transdermal Q24H  . mupirocin ointment   Nasal BID  . sodium chloride  10-40 mL Intracatheter Q12H  . sodium chloride  3 mL Intravenous Q12H  . sotalol  80 mg Oral Q12H  . Warfarin - Pharmacist Dosing Inpatient   Does not apply q1800   Continuous Infusions:    Principal Problem:   Diverticulitis of colon (without mention of hemorrhage) Active Problems:   DIABETES MELLITUS   HYPERLIPIDEMIA   OBESITY   HYPERTENSION   Atrial fibrillation   Acute on chronic diastolic heart failure   Occlusion and stenosis of carotid artery without mention of cerebral infarction   Compression fracture of L1 lumbar vertebra   Cardiac pacemaker in situ   ILD (interstitial lung disease)   Atrial flutter    Time spent: >35 minutes     Esperanza Sheets  [facility] Pager [phone]. If 7PM-7AM, please contact night-coverage at [email or web address], password [facility] 06/04/2013, 8:46 AM  LOS: 12 days

## 2013-06-04 NOTE — Progress Notes (Signed)
General Surgery [facility]  Patient seen and examined.  Did not feel well during the night - better this morning.  Complains of mild pain RLQ - mildly tender RLQ with no guarding or rebound.  Remainder of abdominal exam benign.  Has PICC line in right UE.  May benefit from IV Invanz at home for 10-14 days rather than Augmentin.  Will discuss with team.  Velora Heckler, MD, [facility] Office: [phone]

## 2013-06-04 NOTE — Progress Notes (Addendum)
Physical Therapy Treatment Patient Details Name: Alexandria [name] MRN: [id number] DOB: 11-Feb-1930 Today's Date: 06/04/2013 Time: 4098-1191 PT Time Calculation (min): 25 min  PT Assessment / Plan / Recommendation  History of Present Illness Pt admitted for abdominal pain and was found to have diverticulitis and perforated bowel but no surgical intervention necessary. PT observation/assessment pt presented with R hand weakness co-ordination deficits.   PT Comments   Pt agreeable to therapy. Is self limiting at times due to fear and anxiety. Pt was dizzy after ambulating; BP 130/65 in chair. Pt demo tremors and reported she gets claustrophobic at times, RN notified. Pt was able to increase ambulation distance today with 2 person hand held (A). Will cont to follow per POC.   Follow Up Recommendations  SNF ([facility])      Does the patient have the potential to tolerate intense rehabilitation     Barriers to Discharge        Equipment Recommendations  None recommended by PT    Recommendations for Other Services Rehab consult  Frequency Min 4X/week   Progress towards PT Goals Progress towards PT goals: Progressing toward goals  Plan Current plan remains appropriate    Precautions / Restrictions Precautions Precautions: Fall Restrictions Weight Bearing Restrictions: No   Pertinent Vitals/Pain See comments for BP. No c/o pain today at end of session. Pt was premedicated     Mobility  Bed Mobility Bed Mobility: Supine to Sit;Sitting - Scoot to Edge of Bed Supine to Sit: 4: Min assist;HOB elevated;With rails Sitting - Scoot to Edge of Bed: 5: Supervision Details for Bed Mobility Assistance: (A) to complete elevation of trunk to upright sitting position; pt required increased time due to pain; cues for hand placement and sequencing  Transfers Transfers: Sit to Stand;Stand to Sit Sit to Stand: 1: +2 Total assist;From bed;With upper extremity assist Sit to Stand: Patient Percentage:  70% Stand to Sit: 1: +2 Total assist;To chair/3-in-1;With armrests;With upper extremity assist Stand to Sit: Patient Percentage: 70% Details for Transfer Assistance: pt very anxious to perform sit to stand; pt reported she was scared her legs would give out; (A) to shift weight anteriorly and ext hips to maintain upright standing position Ambulation/Gait Ambulation/Gait Assistance: 1: +2 Total assist Ambulation/Gait: Patient Percentage: 70% Ambulation Distance (Feet): 10 Feet Assistive device: 2 person hand held assist Ambulation/Gait Assistance Details: pt encouarged to widen BOS; pt with lateral lean to Rt today and began to tremor due to anxiety; max cues for encouragement and safety Gait Pattern: Step-to pattern;Decreased stride length;Decreased weight shift to right;Decreased weight shift to left;Lateral trunk lean to left Gait velocity: slow General Gait Details: pt was fatigued at end of session and demo tremors; BP stable and O2 at 95% on RA  Stairs: No Wheelchair Mobility Wheelchair Mobility: No    Exercises General Exercises - Lower Extremity Ankle Circles/Pumps: AROM;Both;10 reps Long Arc Quad: AROM;Both;10 reps;Seated Hip Flexion/Marching: AROM;Both;10 reps;Standing   PT Diagnosis:    PT Problem List:   PT Treatment Interventions:     PT Goals (current goals can now be found in the care plan section) Acute Rehab PT Goals Patient Stated Goal: to go to [facility] from here  PT Goal Formulation: With patient Time For Goal Achievement: 06/13/13 Potential to Achieve Goals: Good  Visit Information  Last PT Received On: 06/04/13 Assistance Needed: +2 (for safety with ambulation ) History of Present Illness: Pt admitted for abdominal pain and was found to have diverticulitis and perforated bowel but no surgical  intervention necessary. PT observation/assessment pt presented with R hand weakness co-ordination deficits.    Subjective Data  Subjective: im really worried about  walking. i dont want to fall  Patient Stated Goal: to go to [facility] from here    [employer] Arousal/Alertness: Awake/alert Behavior During Therapy: Anxious Overall Cognitive Status: No family/caregiver present to determine baseline cognitive functioning Memory: Decreased short-term memory    Balance  Balance Balance Assessed: Yes Static Sitting Balance Static Sitting - Balance Support: Bilateral upper extremity supported;Feet unsupported Static Sitting - Level of Assistance: 5: Stand by assistance Static Sitting - Comment/# of Minutes: pt sitting EOB ~7 min for deep breathing to let dizziness subside; performed bedside exercies  Static Standing Balance Static Standing - Balance Support: Bilateral upper extremity supported;During functional activity Static Standing - Level of Assistance: Other (comment) (2 person handheld )  End of Session PT - End of Session Equipment Utilized During Treatment: Gait belt Activity Tolerance: Other (comment) (pt anxious) Patient left: in chair;with call bell/phone within reach;with family/visitor present Nurse Communication: Mobility status;Other (comment) (pt very anxious )   GP     Shelva Majestic [place], [place] [phone] 06/04/2013, 2:42 PM

## 2013-06-05 LAB — GLUCOSE, CAPILLARY
Glucose-Capillary: 352 mg/dL — ABNORMAL HIGH (ref 70–99)
Glucose-Capillary: 288 mg/dL — ABNORMAL HIGH (ref 70–99)

## 2013-06-05 LAB — CBC
Hemoglobin: 10.6 g/dL — ABNORMAL LOW (ref 12.0–15.0)
MCH: 27.5 pg (ref 26.0–34.0)
MCHC: 31.3 g/dL (ref 30.0–36.0)
Platelets: 233 10*3/uL (ref 150–400)
RBC: 3.85 MIL/uL — ABNORMAL LOW (ref 3.87–5.11)
RDW: 13.7 % (ref 11.5–15.5)
WBC: 8.6 10*3/uL (ref 4.0–10.5)

## 2013-06-05 LAB — HEPARIN LEVEL (UNFRACTIONATED): Heparin Unfractionated: 0.6 IU/mL (ref 0.30–0.70)

## 2013-06-05 LAB — PROTIME-INR
INR: 1.91 — ABNORMAL HIGH (ref 0.00–1.49)
Prothrombin Time: 21.3 seconds — ABNORMAL HIGH (ref 11.6–15.2)

## 2013-06-05 MED ORDER — SODIUM CHLORIDE 0.9 % IV SOLN
1.0000 g | INTRAVENOUS | Status: DC
  Administered 2013-06-05 – 2013-06-06 (×2): 1 g via INTRAVENOUS
  Filled 2013-06-05 (×2): qty 1

## 2013-06-05 MED ORDER — LACTINEX PO CHEW: 1.0000 | CHEWABLE_TABLET | Freq: Three times a day (TID) | ORAL | Status: DC

## 2013-06-05 MED ORDER — SOTALOL HCL 80 MG PO TABS: 80.0000 mg | ORAL_TABLET | Freq: Two times a day (BID) | ORAL | Status: AC

## 2013-06-05 MED ORDER — SODIUM CHLORIDE 0.9 % IV SOLN: 1.0000 g | INTRAVENOUS | Status: AC

## 2013-06-05 MED ORDER — AMLODIPINE BESYLATE 10 MG PO TABS: 10.0000 mg | ORAL_TABLET | Freq: Every day | ORAL | Status: AC

## 2013-06-05 MED ORDER — INSULIN GLARGINE 100 UNIT/ML ~~LOC~~ SOLN: 15.0000 [IU] | Freq: Every day | SUBCUTANEOUS | Status: AC

## 2013-06-05 MED ORDER — LEVALBUTEROL HCL 0.63 MG/3ML IN NEBU: 0.6300 mg | INHALATION_SOLUTION | Freq: Four times a day (QID) | RESPIRATORY_TRACT | Status: DC | PRN

## 2013-06-05 MED ORDER — INSULIN ASPART 100 UNIT/ML ~~LOC~~ SOLN: 0.0000 [IU] | Freq: Three times a day (TID) | SUBCUTANEOUS | Status: AC

## 2013-06-05 MED ORDER — LORAZEPAM 1 MG PO TABS: 1.0000 mg | ORAL_TABLET | Freq: Three times a day (TID) | ORAL | Status: DC | PRN

## 2013-06-05 MED ORDER — HYDROCODONE-ACETAMINOPHEN 5-325 MG PO TABS: 1.0000 | ORAL_TABLET | ORAL | Status: AC | PRN

## 2013-06-05 MED ORDER — WARFARIN SODIUM 4 MG PO TABS
4.0000 mg | ORAL_TABLET | Freq: Once | ORAL | Status: AC
  Administered 2013-06-05: 4 mg via ORAL
  Filled 2013-06-05: qty 1

## 2013-06-05 NOTE — Progress Notes (Signed)
Physical Therapy Treatment Patient Details Name: Alexandria [name] MRN: [id number] DOB: 1929-10-21 Today's Date: 06/05/2013 Time: 7829-5621 PT Time Calculation (min): 15 min  PT Assessment / Plan / Recommendation  History of Present Illness Pt admitted for abdominal pain and was found to have diverticulitis and perforated bowel but no surgical intervention necessary. PT observation/assessment pt presented with R hand weakness co-ordination deficits.   PT Comments   Pt was able to increase ambulation distance today with max encouragement. Pt very fearful of falling. Continues to demo LOB with ambulating. Plans to D/C to [facility] this afternoon for post acute rehab.   Follow Up Recommendations  SNF     Does the patient have the potential to tolerate intense rehabilitation     Barriers to Discharge        Equipment Recommendations  None recommended by PT    Recommendations for Other Services    Frequency Min 3X/week   Progress towards PT Goals Progress towards PT goals: Progressing toward goals  Plan Discharge plan needs to be updated    Precautions / Restrictions Precautions Precautions: Fall Restrictions Weight Bearing Restrictions: No   Pertinent Vitals/Pain No c/o pain today.     Mobility  Bed Mobility Bed Mobility: Supine to Sit;Sitting - Scoot to Edge of Bed Supine to Sit: 4: Min assist;HOB elevated;With rails Sitting - Scoot to Edge of Bed: 5: Supervision Details for Bed Mobility Assistance: (A) to bring shoulders to upright sitting position; increased time due to fatigue and generalized weakness; cues for hand placement and sequencing  Transfers Transfers: Sit to Stand;Stand to Sit Sit to Stand: 1: +2 Total assist;From bed;With upper extremity assist Sit to Stand: Patient Percentage: 70% Stand to Sit: 1: +2 Total assist;To chair/3-in-1;With armrests;With upper extremity assist Stand to Sit: Patient Percentage: 70% Details for Transfer Assistance: pt continues to be  anxious and fearful of falling; tends to lean posteriorly; cues for hand placement and safety  Ambulation/Gait Ambulation/Gait Assistance: 1: +2 Total assist Ambulation/Gait: Patient Percentage: 70% Ambulation Distance (Feet): 18 Feet Assistive device: 2 person hand held assist Ambulation/Gait Assistance Details: pt unstaedy with gt and demo multiple LOB; cues to increase BOS; leans to Rt constantly; requires increased motivation to increase ambulation; pt very fearful of falling  Gait Pattern: Step-to pattern;Decreased stride length;Decreased weight shift to right;Decreased weight shift to left;Lateral trunk lean to left Gait velocity: slow Wheelchair Mobility Wheelchair Mobility: No         PT Diagnosis:    PT Problem List:   PT Treatment Interventions:     PT Goals (current goals can now be found in the care plan section) Acute Rehab PT Goals Patient Stated Goal: to go to [facility] from here  PT Goal Formulation: With patient Time For Goal Achievement: 06/13/13 Potential to Achieve Goals: Good  Visit Information  Last PT Received On: 06/05/13 Assistance Needed: +2 History of Present Illness: Pt admitted for abdominal pain and was found to have diverticulitis and perforated bowel but no surgical intervention necessary. PT observation/assessment pt presented with R hand weakness co-ordination deficits.    Subjective Data  Subjective: "i dont know if i can make it." agreeable to ambulate  Patient Stated Goal: to go to [facility] from here    [employer] Arousal/Alertness: Awake/alert Behavior During Therapy: Anxious Overall Cognitive Status: No family/caregiver present to determine baseline cognitive functioning Memory: Decreased short-term memory    Balance  Balance Balance Assessed: Yes Static Standing Balance Static Standing - Balance Support: Bilateral upper extremity  supported;During functional activity Static Standing - Level of Assistance: Other (comment) (2  person hand held)  End of Session PT - End of Session Equipment Utilized During Treatment: Gait belt Activity Tolerance: Other (comment) (pt is anxious ) Patient left: in chair;with call bell/phone within reach Nurse Communication: Mobility status   GP     Donell Sievert, [place] [phone] 06/05/2013, 12:00 PM

## 2013-06-05 NOTE — Progress Notes (Signed)
Occupational Therapy Treatment Patient Details Name: Alexandria [name] MRN: [id number] DOB: 01-07-1930 Today's Date: 06/05/2013 Time: 1191-4782 OT Time Calculation (min): 17 min  OT Assessment / Plan / Recommendation  History of present illness Pt admitted for abdominal pain and was found to have diverticulitis and perforated bowel but no surgical intervention necessary. PT observation/assessment pt presented with R hand weakness co-ordination deficits.   OT comments  Pt. Remains anxious during tx. Session esp. With initiation of any functional movement.  Declined oob but did agree to ub/lb selfcare secondary to needing assistance for cleaning after B.M. Set up for most of ub aspects but required more assistance for LB secondary to c/o back pain  Follow Up Recommendations  SNF                      Frequency Min 2X/week   Progress towards OT Goals Progress towards OT goals: Progressing toward goals  Plan Discharge plan remains appropriate    Precautions / Restrictions Precautions Precautions: Fall Restrictions Weight Bearing Restrictions: No   Pertinent Vitals/Pain 0/10, but then would mention back pain with all functional movement    ADL  Upper Body Bathing: Performed;Chest;Right arm;Left arm;Abdomen;Set up Where Assessed - Upper Body Bathing: Supine, head of bed up Lower Body Bathing: Performed;Maximal assistance Where Assessed - Lower Body Bathing: Supine, head of bed flat;Rolling right and/or left Upper Body Dressing: Performed;Minimal assistance Where Assessed - Upper Body Dressing: Supine, head of bed up Transfers/Ambulation Related to ADLs: pt. declined oob ADL Comments: max a for peri-care thoroughness secondary to loose stools and had made a large mess in the bed upon arrival.  set up/min a for ub self care.  discussed benefits of bsc vs. bed pan but pt. not accepting the recommendation      OT Goals(current goals can now be found in the care plan section) Acute  Rehab OT Goals Patient Stated Goal: to go [facility] from here   Visit Information  Last OT Received On: 06/05/13 Assistance Needed: +2 History of Present Illness: Pt admitted for abdominal pain and was found to have diverticulitis and perforated bowel but no surgical intervention necessary. PT observation/assessment pt presented with R hand weakness co-ordination deficits.    Subjective Data   "im gonna be fine honey, esp. When i get to my next place for therapy they'll take real good care of me"         Cognition  Cognition Arousal/Alertness: Awake/alert Behavior During Therapy: Anxious Overall Cognitive Status: Within Functional Limits for tasks assessed Memory: Decreased short-term memory    Mobility  Bed Mobility Bed Mobility: Supine to Sit;Sitting - Scoot to Edge of Bed Supine to Sit: 4: Min assist;HOB elevated;With rails Sitting - Scoot to Edge of Bed: 5: Supervision Details for Bed Mobility Assistance: able to roll l/r with min inst. and tactile cues to reach for bed rail while rolling l/r for peri care Transfers Sit to Stand: 1: +2 Total assist;From bed;With upper extremity assist Sit to Stand: Patient Percentage: 70% Stand to Sit: 1: +2 Total assist;To chair/3-in-1;With armrests;With upper extremity assist Stand to Sit: Patient Percentage: 70% Details for Transfer Assistance: pt continues to be anxious and fearful of falling; tends to lean posteriorly; cues for hand placement and safety           Balance Balance Balance Assessed: Yes Static Standing Balance Static Standing - Balance Support: Bilateral upper extremity supported;During functional activity Static Standing - Level of Assistance: Other (comment) (2 person  hand held)   End of Session OT - End of Session Activity Tolerance: Patient tolerated treatment well Patient left: in bed;with call bell/phone within reach       Robet Leu, COTA/L 06/05/2013, 2:57 PM

## 2013-06-05 NOTE — Progress Notes (Signed)
SUBJECTIVE:  The patient is sleepy but is not having any new chest pain or SOB.     PHYSICAL EXAM Filed Vitals:   06/04/13 1656 06/04/13 2011 06/05/13 0511 06/05/13 0814  BP: 113/56 135/50 145/71 115/62  Pulse: 77 74 79 83  Temp: 98.8 F (37.1 C) 98.5 F (36.9 C) 98.9 F (37.2 C) 98.1 F (36.7 C)  TempSrc:  Oral Oral Oral  Resp: 16 18 18 19   Height:      Weight:      SpO2: 96% 100% 96% 98%    LABS:  Results for orders placed during the hospital encounter of 05/23/13 (from the past 24 hour(s))  GLUCOSE, CAPILLARY     Status: Abnormal   Collection Time    06/04/13 11:17 AM      Result Value Range   Glucose-Capillary 242 (*) 70 - 99 mg/dL  GLUCOSE, CAPILLARY     Status: Abnormal   Collection Time    06/04/13  4:54 PM      Result Value Range   Glucose-Capillary 373 (*) 70 - 99 mg/dL  HEPARIN LEVEL (UNFRACTIONATED)     Status: None   Collection Time    06/04/13  6:40 PM      Result Value Range   Heparin Unfractionated 0.33  0.30 - 0.70 IU/mL  GLUCOSE, CAPILLARY     Status: Abnormal   Collection Time    06/04/13 10:09 PM      Result Value Range   Glucose-Capillary 156 (*) 70 - 99 mg/dL   Comment 1 Documented in Chart     Comment 2 Notify RN    PROTIME-INR     Status: Abnormal   Collection Time    06/05/13  5:00 AM      Result Value Range   Prothrombin Time 21.3 (*) 11.6 - 15.2 seconds   INR 1.91 (*) 0.00 - 1.49  CBC     Status: Abnormal   Collection Time    06/05/13  5:00 AM      Result Value Range   WBC 8.6  4.0 - 10.5 K/uL   RBC 3.85 (*) 3.87 - 5.11 MIL/uL   Hemoglobin 10.6 (*) 12.0 - 15.0 g/dL   HCT 16.1 (*) 09.6 - 04.5 %   MCV 88.1  78.0 - 100.0 fL   MCH 27.5  26.0 - 34.0 pg   MCHC 31.3  30.0 - 36.0 g/dL   RDW 40.9  81.1 - 91.4 %   Platelets 233  150 - 400 K/uL  HEPARIN LEVEL (UNFRACTIONATED)     Status: None   Collection Time    06/05/13  5:00 AM      Result Value Range   Heparin Unfractionated 0.60  0.30 - 0.70 IU/mL  GLUCOSE, CAPILLARY      Status: Abnormal   Collection Time    06/05/13  6:40 AM      Result Value Range   Glucose-Capillary 213 (*) 70 - 99 mg/dL  GLUCOSE, CAPILLARY     Status: Abnormal   Collection Time    06/05/13  8:13 AM      Result Value Range   Glucose-Capillary 217 (*) 70 - 99 mg/dL    Intake/Output Summary (Last 24 hours) at 06/05/13 0904 Last data filed at 06/05/13 0800  Gross per 24 hour  Intake 320.12 ml  Output    150 ml  Net 170.12 ml     ASSESSMENT AND PLAN:  ATRIAL FIB:   On Sotalol  and warfarin.  No need for bridging Lovenox.  However, she will need to have follow up of her warfarin arranged.    ACUTE ON CHRONIC DIASTOLIC HF:   She seems to be euvolemic.   Continue current meds.    Fayrene Fearing [facility] 06/05/2013 9:04 AM

## 2013-06-05 NOTE — Progress Notes (Signed)
Inpatient Diabetes Program Recommendations  AACE/ADA: New Consensus Statement on Inpatient Glycemic Control (2013)  Target Ranges:  Prepandial:   less than 140 mg/dL      Peak postprandial:   less than 180 mg/dL (1-2 hours)      Critically ill patients:  140 - 180 mg/dL     Results for RAIA, AMICO (MRN [id number]) as of 06/05/2013 10:49  Ref. Range 06/04/2013 07:20 06/04/2013 11:17 06/04/2013 16:54 06/04/2013 22:09  Glucose-Capillary Latest Range: 70-99 mg/dL 409 (H) 811 (H) 914 (H) 156 (H)    Results for DESTYNE, GOODREAU (MRN [id number]) as of 06/05/2013 10:49  Ref. Range 06/05/2013 08:13  Glucose-Capillary Latest Range: 70-99 mg/dL 086 (H)    **CBGs above goal.  PO intake poor yesterday (12/08): 0% (breakfast), 35% (lunch), 30% (dinner)   **MD- Please consider the following in-hospital insulin adjustments:  1. Increase Lantus to 15 units QHS 2. Increase Novolog SSI to Moderate scale tid ac + HS   Will follow. Ambrose Finland RN, MSN, CDE Diabetes Coordinator Inpatient Diabetes Program Team Pager: [phone] (8a-10p)

## 2013-06-05 NOTE — Clinical Social Work Note (Signed)
Discharge information sent to [facility] and questions from [facility] at rehab clarified. CSW advised that patient will need to discharge on Wednesday, 12/10 to allow them to secure all medications. CSW will facilitate patient's transport to [facility] to continue her rehabilitation.  Genelle Bal, MSW, LCSW [phone]

## 2013-06-05 NOTE — Progress Notes (Signed)
ANTICOAGULATION CONSULT NOTE - Follow Up Consult  Pharmacy Consult for heparin and coumadin Indication: atrial fibrillation  Allergies  Allergen Reactions  . Prednisone Other (See Comments)    REACTION: "retained fluid"  . Ace Inhibitors Cough       . Crestor [Rosuvastatin Calcium] Other (See Comments)    Fatigue and leg pain   . Lipitor [Atorvastatin Calcium] Other (See Comments)    Myalgia and fatigue   . Niaspan [Niacin] Other (See Comments)    unknown  . Olmesartan Cough    Patient Measurements: Height: 5\' 3"  (160 cm) Weight: 179 lb 14.4 oz (81.602 kg) IBW/kg (Calculated) : 52.4   Vital Signs: Temp: 98.1 F (36.7 C) (12/09 0814) Temp src: Oral (12/09 0814) BP: 115/62 mmHg (12/09 0814) Pulse Rate: 83 (12/09 0814)  Labs:  Recent Labs  06/03/13 0345 06/04/13 0500 06/04/13 1840 06/05/13 0500  HGB 10.4* 11.0*  --  10.6*  HCT 33.4* 34.6*  --  33.9*  PLT 242 234  --  233  LABPROT 19.4* 18.4*  --  21.3*  INR 1.69* 1.58*  --  1.91*  HEPARINUNFRC  --   --  0.33 0.60    Estimated Creatinine Clearance: 55.8 ml/min (by C-G formula based on Cr of 0.53).  Assessment: Patient an 79 y.o F on heparin and coumadin for Afib.  INR is almost at goal with 1.91 today and heparin is therapeutic this morning.  No bleeding documented.  Goal of Therapy:  INR 2-3 Heparin level 0.3-0.7 units/ml Monitor platelets by anticoagulation protocol: Yes   Plan:  1) coumadin 4mg  PO x1 today.  If to discharge patient today, recommend 4mg  daily for d/c. 2) no change for heparin rate  Jade Burright P 06/05/2013,9:28 AM

## 2013-06-05 NOTE — Progress Notes (Signed)
General Surgery [facility]  Discussed with Barnetta Chapel.  Given patient's clinical response to Invanz and poor response to Unasyn, I think she would benefit from IV Invanz once daily for 10-14 day course.  If not feasible with SNF, will use po Augmentin.  Plan follow up at [facility] office with Dr. Derrell Lolling.  Velora Heckler, MD, [facility] Office: [phone]

## 2013-06-05 NOTE — Progress Notes (Signed)
Patient ID: Alexandria [name], female   DOB: December 28, 1929, 79 y.o.   MRN: [id number]    Subjective: Pt feeling well today.  Tolerating her low fiber diet well.  No pain.  Having BMs  Objective: Vital signs in last 24 hours: Temp:  [97.8 F (36.6 C)-98.9 F (37.2 C)] 98.9 F (37.2 C) (12/09 0511) Pulse Rate:  [74-89] 79 (12/09 0511) Resp:  [16-19] 18 (12/09 0511) BP: (101-145)/(50-71) 145/71 mmHg (12/09 0511) SpO2:  [96 %-100 %] 96 % (12/09 0511) Last BM Date: 06/02/13  Intake/Output from previous day: 12/08 0701 - 12/09 0700 In: 200.1 [P.O.:200.1] Out: 150 [Urine:150] Intake/Output this shift:    PE: Abd: soft, NT, ND, +BS  Lab Results:   Recent Labs  06/04/13 0500 06/05/13 0500  WBC 10.2 8.6  HGB 11.0* 10.6*  HCT 34.6* 33.9*  PLT 234 233   BMET No results found for this basename: NA, K, CL, CO2, GLUCOSE, BUN, CREATININE, CALCIUM,  in the last 72 hours PT/INR  Recent Labs  06/04/13 0500 06/05/13 0500  LABPROT 18.4* 21.3*  INR 1.58* 1.91*   CMP     Component Value Date/Time   NA 137 06/01/2013 0400   NA 141 03/08/2013 1221   K 3.7 06/01/2013 0400   CL 95* 06/01/2013 0400   CO2 36* 06/01/2013 0400   GLUCOSE 234* 06/01/2013 0400   GLUCOSE 160* 03/08/2013 1221   BUN 6 06/01/2013 0400   BUN 20 03/08/2013 1221   CREATININE 0.53 06/01/2013 0400   CREATININE 0.96 12/05/2012 1251   CALCIUM 9.2 06/01/2013 0400   PROT 6.6 05/24/2013 0525   PROT 7.1 03/08/2013 1221   ALBUMIN 2.6* 05/24/2013 0525   AST 10 05/24/2013 0525   ALT 5 05/24/2013 0525   ALKPHOS 81 05/24/2013 0525   BILITOT 0.5 05/24/2013 0525   GFRNONAA 87* 06/01/2013 0400   GFRAA >90 06/01/2013 0400   Lipase     Component Value Date/Time   LIPASE 32 08/12/2012 0948       Studies/Results: No results found.  Anti-infectives: Anti-infectives   Start     Dose/Rate Route Frequency Ordered Stop   06/04/13 1000  amoxicillin-clavulanate (AUGMENTIN) 875-125 MG per tablet 1 tablet     1 tablet Oral Every 12  hours 06/04/13 0845     05/31/13 1100  ertapenem (INVANZ) 1 g in sodium chloride 0.9 % 50 mL IVPB  Status:  Discontinued     1 g 100 mL/hr over 30 Minutes Intravenous Every 24 hours 05/31/13 0947 06/04/13 0845   05/29/13 2200  Ampicillin-Sulbactam (UNASYN) 3 g in sodium chloride 0.9 % 100 mL IVPB  Status:  Discontinued    Comments:  Switch from Invanz to unasyn   3 g 100 mL/hr over 60 Minutes Intravenous Every 6 hours 05/29/13 1943 05/31/13 0947   05/29/13 1800  Ampicillin-Sulbactam (UNASYN) 3 g in sodium chloride 0.9 % 100 mL IVPB  Status:  Discontinued    Comments:  Switch from Invanz to unasyn   3 g 100 mL/hr over 60 Minutes Intravenous Every 6 hours 05/29/13 1630 05/29/13 1943   05/29/13 1030  Ampicillin-Sulbactam (UNASYN) 3 g in sodium chloride 0.9 % 100 mL IVPB  Status:  Discontinued    Comments:  Switch from Invanz to unasyn   3 g 100 mL/hr over 60 Minutes Intravenous Every 6 hours 05/29/13 0926 05/29/13 1630   05/25/13 1130  ertapenem (INVANZ) 1 g in sodium chloride 0.9 % 50 mL IVPB  Status:  Discontinued  1 g 100 mL/hr over 30 Minutes Intravenous Every 24 hours 05/25/13 1126 05/29/13 0926   05/23/13 2200  metroNIDAZOLE (FLAGYL) IVPB 500 mg  Status:  Discontinued     500 mg 100 mL/hr over 60 Minutes Intravenous Every 8 hours 05/23/13 2140 05/25/13 1126   05/23/13 2145  ciprofloxacin (CIPRO) IVPB 400 mg  Status:  Discontinued     400 mg 200 mL/hr over 60 Minutes Intravenous Every 12 hours 05/23/13 2140 05/25/13 1126       Assessment/Plan  1. Diverticulitis with microperforation Patient Active Problem List   Diagnosis Date Noted  . Atrial flutter 05/25/2013  . Diverticulitis of colon (without mention of hemorrhage) 05/23/2013  . Cardiac pacemaker in situ 05/23/2013  . ILD (interstitial lung disease) 05/23/2013  . Diverticulitis 05/23/2013  . Compression fracture of L1 lumbar vertebra 04/06/2013  . Afib 09/13/2012  . Vertigo 08/12/2012  . Pacemaker-[employer]  03/06/2012  . Chronic diastolic CHF (congestive heart failure) 01/23/2012  . Occlusion and stenosis of carotid artery without mention of cerebral infarction 01/19/2012  . Tachycardia-bradycardia syndrome 10/10/2011  . Hypomagnesemia 09/01/2011  . Weakness generalized 08/31/2011  . Leukocytosis 08/31/2011  . UTI (lower urinary tract infection) 08/17/2011  . Acute on chronic diastolic heart failure 05/20/2011  . EDEMA 10/22/2009  . Atrial fibrillation 09/05/2009  . Acute on chronic combined systolic and diastolic heart failure 07/18/2009  . CAD 06/16/2009  . OBESITY 02/25/2009  . PREMATURE ATRIAL CONTRACTIONS 02/25/2009  . DYSPNEA 02/25/2009  . DIABETES MELLITUS 07/28/2007  . HYPERLIPIDEMIA 07/28/2007  . HYPERTENSION 07/28/2007  . COLONIC POLYPS, ADENOMATOUS, HX OF 05/31/2007   Plan: 1. Patient stable from our standpoint to dc to SNF today if bed available.  She should follow up with Dr. Derrell Lolling in the office in 3 weeks. 2. After speaking with Dr. Gerrit Friends, given the patient's history and response to prior abx therapy this admission, he would like for the patient to continue on IV Invanz for 10-14 days.  I have called [facility].  They do accept people who are on IV abx therapy.  I have left a message for Erie Noe, our SW, to call me when she arrives to get this set up.  If for some reason, this is unable to be done, then we will transition the patient to oral therapy.  In the meantime, I have switched the patient back to [facility] while here.  LOS: 13 days    Cari Burgo E 06/05/2013, 7:57 AM Pager: [phone]

## 2013-06-05 NOTE — Discharge Summary (Signed)
Physician Discharge Summary  FAIGA STONES [name]:[id number] DOB: 1929-10-12 DOA: 05/23/2013  PCP: [name], MD  Admit date: 05/23/2013 Discharge date: 06/05/2013  Time spent: >35 minutes  Recommendations for Outpatient Follow-up:  F/u with surgery in 2-3 weeks F/u with cardiology ion 1-2 weeks   Discharge Diagnoses:  Principal Problem:   Diverticulitis of colon (without mention of hemorrhage) Active Problems:   DIABETES MELLITUS   HYPERLIPIDEMIA   OBESITY   HYPERTENSION   Atrial fibrillation   Acute on chronic diastolic heart failure   Occlusion and stenosis of carotid artery without mention of cerebral infarction   Compression fracture of L1 lumbar vertebra   Cardiac pacemaker in situ   ILD (interstitial lung disease)   Atrial flutter   Discharge Condition: stable   Diet recommendation: heart healthy   Filed Weights   06/01/13 2124 06/02/13 2034 06/03/13 2046  Weight: 81.9 kg (180 lb 8.9 oz) 81.9 kg (180 lb 8.9 oz) 81.602 kg (179 lb 14.4 oz)    History of present illness:  Alexandria [name] is an 79 y.o. female with hx of afib on chronic anticoagulation, hx of B12 deficiency, essential tremor, ILD, hyperlipidemia, DM, s/p PPM who presented to [facility] hospital with abdominal pain. She was found to have a perforated diverticulitis by CT and subsequently transferred to [facility] per patient and family's request. Patient hospital course also complicated by A fib RVR and Heart failure exacerbation.   Hospital Course:  1. Perforated Diverticulitis: on repeat CT: no significant change  -patient clinically improved on IV atx regimen; she was evaluated by surgery who recommended to continue [facility] for 10-14 days and outpatient follow up in 2-3 week; patient is tolerating diet well; need to watch for atx related complications; added lactobacillus  2. Hypertension: cont Norvasc increased to 10 mg, Imdur. 3. A fib s/p PPM: initially she was placed on IV Cardizem for HR control;   Cardiologist recommended to start Sotalol. HR is under control; resume coumadin; may need to restart metoprolol if HR increases; outpatient cardiology follow up in 1-2 weeks 4. Acute on Chronic diastolic Heart Failure; echo (8119): LVEF 55%, LVH; XR (12/2): mild edema, L effusion;  -she improved well on oral lasix. I/O neg;cont PO diuretics 5. Diabetes: SSI. Resumed lantus increase to 15 units+ISS; cont titration as needed   6. ILD: stable. Cont oxygen treatment  7. Anxiety/ delirium: resolved   8. Recent L-spine vertebral compression fracture; neuro exam is non focal; CT head: no acute findings;  -cont pain control; SNF    Procedures:  CT abd as below  (i.e. Studies not automatically included, echos, thoracentesis, etc; not x-rays)  Consultations:  Surgery  cardiology  Discharge Exam: Filed Vitals:   06/05/13 0814  BP: 115/62  Pulse: 83  Temp: 98.1 F (36.7 C)  Resp: 19    General: alert Cardiovascular: s1,s2 rrr Respiratory: few crackles in LL improved   Discharge Instructions  Discharge Orders   Future Appointments Provider Department Dept Phone   06/14/2013 11:20 AM Ileana Ladd, MD [facility] [phone]   07/06/2013 2:15 PM Axel Filler, MD [facility], [place] [phone]   01/23/2014 12:30 PM [facility] [place] [address] [phone]   01/23/2014 1:30 PM Chuck Hint, MD [facility] [phone]   Future Orders Complete By Expires   Diet - low sodium heart healthy  As directed    Discharge instructions  As directed    Comments:  Please follow up with surgery in 2-3 weeks   Increase activity slowly  As directed        Medication List    STOP taking these medications       dextrose 5 % SOLN 50 mL with cefTRIAXone 1 G SOLR 1 g     metoprolol 50 MG tablet  Commonly known as:  LOPRESSOR      TAKE these medications       acetaminophen 325 MG tablet   Commonly known as:  TYLENOL  Take 650 mg by mouth every 4 (four) hours as needed for mild pain or fever.     aluminum-magnesium hydroxide-simethicone 200-200-20 MG/5ML Susp  Commonly known as:  MAALOX  Take 20 mLs by mouth 3 (three) times daily as needed (indigestion).     amLODipine 10 MG tablet  Commonly known as:  NORVASC  Take 1 tablet (10 mg total) by mouth daily.     azelastine 137 MCG/SPRAY nasal spray  Commonly known as:  ASTELIN  Place 2 sprays into both nostrils 2 (two) times daily. Use in each nostril as directed     bismuth subsalicylate 262 MG/15ML suspension  Commonly known as:  PEPTO BISMOL  Take 30 mLs by mouth as needed for diarrhea or loose stools.     calcitonin (salmon) 200 UNIT/ACT nasal spray  Commonly known as:  MIACALCIN/FORTICAL  Place 1 spray into alternate nostrils daily.     furosemide 80 MG tablet  Commonly known as:  LASIX  Take 1 tablet (80 mg total) by mouth 2 (two) times daily.     guaiFENesin 100 MG/5ML Soln  Commonly known as:  ROBITUSSIN  Take 10 mLs by mouth every 6 (six) hours as needed for cough or to loosen phlegm.     HYDROcodone-acetaminophen 5-325 MG per tablet  Commonly known as:  NORCO/VICODIN  Take 1 tablet by mouth every 4 (four) hours as needed for moderate pain.     insulin aspart 100 UNIT/ML injection  Commonly known as:  novoLOG  Inject 0-9 Units into the skin 3 (three) times daily with meals.     insulin glargine 100 UNIT/ML injection  Commonly known as:  LANTUS  Inject 0.15 mLs (15 Units total) into the skin at bedtime.     isosorbide mononitrate 60 MG 24 hr tablet  Commonly known as:  IMDUR  Take 60 mg by mouth daily.     lactobacillus acidophilus & bulgar chewable tablet  Chew 1 tablet by mouth 3 (three) times daily with meals.     latanoprost 0.005 % ophthalmic solution  Commonly known as:  XALATAN  Place 1 drop into both eyes at bedtime.     levalbuterol 0.63 MG/3ML nebulizer solution  Commonly known as:   XOPENEX  Take 3 mLs (0.63 mg total) by nebulization every 6 (six) hours as needed for wheezing or shortness of breath.     levothyroxine 50 MCG tablet  Commonly known as:  SYNTHROID, LEVOTHROID  Take 50 mcg by mouth daily before breakfast.     lidocaine 5 %  Commonly known as:  LIDODERM  Place 1 patch onto the skin daily. Left lumbar area; Remove & Discard patch within 12 hours or as directed by MD     LORazepam 1 MG tablet  Commonly known as:  ATIVAN  Take 1 tablet (1 mg total) by mouth every 8 (eight) hours as needed for anxiety.     senna 8.6 MG tablet  Commonly known as:  [employer]  Take 1 tablet by mouth 2 (two) times daily as needed for constipation.     sodium chloride 0.9 % SOLN 50 mL with ertapenem 1 G SOLR 1 g  Inject 1 g into the vein daily.     sotalol 80 MG tablet  Commonly known as:  BETAPACE  Take 1 tablet (80 mg total) by mouth every 12 (twelve) hours.     warfarin 4 MG tablet  Commonly known as:  COUMADIN  Take 4 mg by mouth daily.     white petrolatum Gel  Commonly known as:  VASELINE  Apply 1 application topically as needed for dry skin.       Allergies  Allergen Reactions  . Prednisone Other (See Comments)    REACTION: "retained fluid"  . Ace Inhibitors Cough       . Crestor [Rosuvastatin Calcium] Other (See Comments)    Fatigue and leg pain   . Lipitor [Atorvastatin Calcium] Other (See Comments)    Myalgia and fatigue   . Niaspan [Niacin] Other (See Comments)    unknown  . Olmesartan Cough       Follow-up Information   Follow up with Lajean Saver, MD On 07/06/2013. (2:15pm, arrive at 1:45pm for paperwork)    Specialty:  General Surgery   Contact information:   [address] [place] [address] [phone]       Follow up with [name], MD In 1 week.   Specialty:  Family Medicine   Contact information:   [address] [place] [address] [phone]        The results of significant diagnostics from  this hospitalization (including imaging, microbiology, ancillary and laboratory) are listed below for reference.    Significant Diagnostic Studies: Ct Angio Head W/cm &/or Wo Cm  05/30/2013   CLINICAL DATA:  Right-sided weakness.  EXAM: CT ANGIOGRAPHY HEAD  TECHNIQUE: Multidetector CT imaging of the head was performed using the standard protocol during bolus administration of intravenous contrast. Multiplanar CT image reconstructions including MIPs were obtained to evaluate the vascular anatomy.  CONTRAST:  50 mL Omnipaque 350  COMPARISON:  CT head without contrast 06/09/2013  FINDINGS: Diffuse white matter changes are again noted. The source images demonstrate no definite acute cortical infarct. Postcontrast images demonstrate no pathologic enhancement. The dural sinuses fill normally.  Dense atherosclerotic calcifications are present in the cavernous carotid arteries bilaterally with mild to moderate stenoses bilaterally, left greater than right. There is mild narrowing of the distal M1 segments bilaterally. The MCA bifurcations are intact anterior communicating artery is patent. Mild to moderate segmental narrowing is present in the distal MCA branch vessels bilaterally.  In the vertebral arteries demonstrate atherosclerotic calcifications at the dural margin bilaterally. The left vertebral artery is slightly dominant to the right. The PICA origins are visualized and within normal limits bilaterally. The basilar artery is intact. The right posterior cerebral artery is of fetal type. There is attenuation of distal PCA branch vessels bilaterally.  Review of the MIP images confirms the above findings.  IMPRESSION: 1. No acute cortical infarct. 2. Mild-to-moderate cavernous carotid artery stenosis bilaterally, left greater than right. 3. Diffuse small vessel disease. 4. Atherosclerotic irregularity within the vertebral arteries bilaterally without significant stenosis.   Electronically Signed   By: Gennette Pac  M.D.   On: 05/30/2013 19:11   Ct Head Wo Contrast  05/30/2013   CLINICAL DATA:  Right-sided weakness. Hypertension and hyperlipidemia. Atrial fibrillation. Diabetic.  EXAM: CT HEAD WITHOUT CONTRAST  TECHNIQUE: Contiguous axial images were obtained from the base of the skull through the vertex without intravenous contrast.  COMPARISON:  04/05/2013 CT.  FINDINGS: No intracranial hemorrhage.  Prominent white matter type changes without CT evidence of large acute infarct. Small acute infarct would be difficult to exclude given the degree of white matter type changes.  Global atrophy without hydrocephalus.  Vascular calcifications.  No intracranial mass lesion noted on this unenhanced exam.  IMPRESSION: No intracranial hemorrhage.  Prominent white matter type changes without CT evidence of large acute infarct. Small acute infarct would be difficult to exclude given the degree of white matter type changes.  Global atrophy without hydrocephalus.   Electronically Signed   By: Bridgett Larsson M.D.   On: 05/30/2013 12:15   Ct Abdomen Pelvis W Contrast  05/31/2013   CLINICAL DATA:  Acute diverticulitis with microperforation. Worsening leukocytosis.  EXAM: CT ABDOMEN AND PELVIS WITH CONTRAST  TECHNIQUE: Multidetector CT imaging of the abdomen and pelvis was performed using the standard protocol following bolus administration of intravenous contrast.  CONTRAST:  80mL OMNIPAQUE IOHEXOL 300 MG/ML  SOLN  COMPARISON:  None.  FINDINGS: Images through the lung bases show small bilateral pleural effusions bibasilar atelectasis. Diffuse body wall edema also noted.  Mild diverticulitis is seen involving the sigmoid colon. A small amount of free fluid is seen in the pelvic cul-de-sac. There is also small extraluminal gas bubbles seen in the adjacent sigmoid mesocolon. In addition, there is a small amount of free intraperitoneal air, consistent with perforation.  The liver, pancreas, adrenal glands, and kidneys are normal in appearance,  except for small renal cysts. No evidence of hydronephrosis. Small ill-defined low-attenuation lesion in the spleen is likely benign. No evidence of splenomegaly. No soft tissue masses or lymphadenopathy identified. Foley catheter is seen within the bladder which is collapsed. No evidence of bowel obstruction. Old L1 vertebral body compression fracture deformity incidentally noted as well as lumbar spine degenerative changes.  IMPRESSION: Perforated sigmoid diverticulitis, with small amount of free intraperitoneal air noted.  Small amount of free fluid in pelvic cul-de-sac. No drainable abscess visualized.  Critical Value/emergent results were called by telephone at the time of interpretation on 05/31/2013 at 5:22 PM to patient's floor nurse Okey Regal, who verbally acknowledged these results.  Small bilateral pleural effusions and bibasilar atelectasis.   Electronically Signed   By: Myles Rosenthal M.D.   On: 05/31/2013 17:25   Dg Chest Port 1 View  05/29/2013   CLINICAL DATA:  Confirm line placement  EXAM: PORTABLE CHEST - 1 VIEW  COMPARISON:  05/25/2013  FINDINGS: There is a right arm PICC line with tip 6.2 cm below the carina. There is a left chest wall pacer device with leads in the right atrial appendage right ventricle. Lung volumes remain low. Small left pleural effusion is identified. There is atelectasis noted in the left base.  IMPRESSION: 1. Right arm PICC line tip is in the right atrium. Recommend withdrawing by 1.5 cm. 2. Left pleural effusion with left base atelectasis.   Electronically Signed   By: Signa Kell M.D.   On: 05/29/2013 17:57   Dg Chest Port 1 View  05/25/2013   CLINICAL DATA:  Dyspnea with history of CHF and hypertension.  EXAM: PORTABLE CHEST - 1 VIEW  COMPARISON:  Portable chest x-ray dated August 12, 2012.  FINDINGS: The right hemidiaphragm remains higher than the left. Both lungs are slightly less well inflated today. There is no alveolar infiltrate but the interstitial markings  are more prominent today than in the past. The cardiac silhouette is top-normal in size. There is prominence the right hilar structures which is stable. The pulmonary vascularity is also prominent centrally. A permanent pacemaker is not changed in appearance and position.  IMPRESSION: There is low grade CHF with mild interstitial edema which is accentuated by mild hypoinflation of both lungs and chronic elevation of the right hemidiaphragm. No focal pneumonia is demonstrated.   Electronically Signed   By: David  [place]   On: 05/25/2013 09:35   Dg Abd Portable 1v  05/31/2013   CLINICAL DATA:  Abdominal pain  EXAM: PORTABLE ABDOMEN - 1 VIEW  COMPARISON:  05/25/2013  FINDINGS: Barium remains throughout the colon, unchanged from the prior study. Negative for bowel dilatation. No significant stool in the colon. Small bowel is nondilated.  IMPRESSION: Retained contrast throughout the colon is unchanged due to poor motility. Negative for obstruction.   Electronically Signed   By: Marlan Palau M.D.   On: 05/31/2013 09:08   Dg Abd Portable 2v  05/25/2013   CLINICAL DATA:  Abdominal pain, history of diverticulitis, evaluate for free air  EXAM: PORTABLE ABDOMEN - 2 VIEW  COMPARISON:  Chest radiograph -05/25/2013  FINDINGS: Radiopaque material is seen throughout the colon. No definite evidence of obstruction. No pneumoperitoneum, pneumatosis or portal venous gas.  Limited visualization the lower thorax demonstrates pacer leads overlying the expected location of the right atrium and ventricle. A granuloma overlies the left lower lung. There is mild elevation of the right hemidiaphragm.  Extensive calcifications within the splenic artery and within the pelvic vasculature. Suspected granulomas within the spleen. Otherwise, no definitive abnormal intra-abdominal calcifications.  No acute osseus abnormalities. Mild-to-moderate multilevel thoracolumbar spine degenerative change with associated mild scoliotic curvature.   IMPRESSION: Nonobstructive bowel gas pattern. No definite evidence of pneumoperitoneum. Further evaluation may be obtained with abdominal CT as clinically indicated.   Electronically Signed   By: Simonne Come M.D.   On: 05/25/2013 12:01    Microbiology: No results found for this or any previous visit (from the past 240 hour(s)).   Labs: Basic Metabolic Panel:  Recent Labs Lab 05/30/13 0430 06/01/13 0400  NA 140 137  K 5.1 3.7  CL 101 95*  CO2 34* 36*  GLUCOSE 127* 234*  BUN 15 6  CREATININE 0.59 0.53  CALCIUM 10.0 9.2   Liver Function Tests: No results found for this basename: AST, ALT, ALKPHOS, BILITOT, PROT, ALBUMIN,  in the last 168 hours No results found for this basename: LIPASE, AMYLASE,  in the last 168 hours No results found for this basename: AMMONIA,  in the last 168 hours CBC:  Recent Labs Lab 06/01/13 0400 06/02/13 0500 06/03/13 0345 06/04/13 0500 06/05/13 0500  WBC 10.5 8.8 9.2 10.2 8.6  HGB 10.3* 10.0* 10.4* 11.0* 10.6*  HCT 33.1* 32.5* 33.4* 34.6* 33.9*  MCV 89.2 89.8 89.8 88.3 88.1  PLT 256 242 242 234 233   Cardiac Enzymes: No results found for this basename: CKTOTAL, CKMB, CKMBINDEX, TROPONINI,  in the last 168 hours BNP: BNP (last 3 results)  Recent Labs  06/24/12 1600 08/12/12 0948 05/25/13 1040  PROBNP 1253.0* 301.2 1061.0*   CBG:  Recent Labs Lab 06/04/13 1117 06/04/13 1654 06/04/13 2209 06/05/13 0640 06/05/13 0813  GLUCAP 242* 373* 156* 213* 217*       Signed:  Jonette Mate N  [facility] 06/05/2013, 11:38 AM

## 2013-06-06 LAB — CBC
Hemoglobin: 10.7 g/dL — ABNORMAL LOW (ref 12.0–15.0)
MCH: 27.9 pg (ref 26.0–34.0)
MCHC: 31.6 g/dL (ref 30.0–36.0)
MCV: 88.3 fL (ref 78.0–100.0)
Platelets: 228 10*3/uL (ref 150–400)
RBC: 3.84 MIL/uL — ABNORMAL LOW (ref 3.87–5.11)
WBC: 9.4 10*3/uL (ref 4.0–10.5)

## 2013-06-06 LAB — GLUCOSE, CAPILLARY
Glucose-Capillary: 188 mg/dL — ABNORMAL HIGH (ref 70–99)
Glucose-Capillary: 366 mg/dL — ABNORMAL HIGH (ref 70–99)

## 2013-06-06 LAB — HEPARIN LEVEL (UNFRACTIONATED): Heparin Unfractionated: <0.1 IU/mL — ABNORMAL LOW (ref 0.30–0.70)

## 2013-06-06 MED ORDER — HEPARIN SOD (PORK) LOCK FLUSH 100 UNIT/ML IV SOLN
250.0000 [IU] | INTRAVENOUS | Status: AC | PRN
  Administered 2013-06-06: 500 [IU]

## 2013-06-06 NOTE — Progress Notes (Signed)
Patient discharged to [facility]. Report called to Elmarie Shiley, Charity fundraiser.  Patient remains stable; no signs or symptoms of distress.  Patient discharged with RUA PICC in place; heparin locked and capped prior to discharge. Patient transported via EMS with belongings at her side.

## 2013-06-06 NOTE — Clinical Social Work Note (Signed)
Patient medically stable for discharge to [facility] today. Facility notified of patient's d/c and mode of transport. Medical packet compiled and CSW facilitated transport to facility via ambulance.  Genelle Bal, MSW, LCSW [phone]

## 2013-06-06 NOTE — Progress Notes (Signed)
General Surgery [facility]  Discharge plans noted.  IV Invanz therapy arranged and will continue at new facility.  Follow up at [facility] office scheduled.  Velora Heckler, MD, [facility] Office: [phone]

## 2013-06-06 NOTE — Progress Notes (Signed)
NUTRITION FOLLOW-UP  DOCUMENTATION CODES Per approved criteria  -Obesity Unspecified   INTERVENTION: Continue Resource Breeze po TID, each supplement provides 250 kcal and 9 grams of protein. RD to continue to follow nutrition care plan.  NUTRITION DIAGNOSIS: Inadequate oral intake related to altered GI function as evidenced by limited appetite.  Goal: Intake to meet >90% of estimated nutrition needs. Improving.  Monitor:  PO intake, labs, weight trend  ASSESSMENT: Patient presented to [facility] with abdominal pain. She was found to have a perforated diverticulitis by CT and subsequently transferred to [facility] per patient and family's request. Patient hospital course also complicated by A fib RVR and Heart failure exacerbation.   Advanced to Full Liquid diet on 12/6 and Low Fiber diet on 12/8. Per chart, pt is tolerating her meals well at this time. Continues with orders for Resource Breeze PO TID. Pt reports that she "lovesTeacher, English as a foreign language. She also notes that she is anxious to go back to her rehab facility today.  Height: Ht Readings from Last 1 Encounters:  06/03/13 5\' 3"  (1.6 m)    Weight: Wt Readings from Last 1 Encounters:  06/03/13 179 lb 14.4 oz (81.602 kg)  Admit wt 179 lb - stable  BMI:  Body mass index is 31.88 kg/(m^2). Obesity, Class I  Estimated Nutritional Needs: Kcal: 1450-1650 Protein: 80-100 gm Fluid: 1.6-1.7 L  Skin: no wounds  Diet Order: Fiber Restricted  EDUCATION NEEDS: -No education needs identified at this time   Intake/Output Summary (Last 24 hours) at 06/06/13 1203 Last data filed at 06/06/13 0840  Gross per 24 hour  Intake    710 ml  Output    225 ml  Net    485 ml    Last BM: 12/9  Labs:   Recent Labs Lab 06/01/13 0400  NA 137  K 3.7  CL 95*  CO2 36*  BUN 6  CREATININE 0.53  CALCIUM 9.2  GLUCOSE 234*    CBG (last 3)   Recent Labs  06/05/13 1624 06/05/13 2128 06/06/13 0753  GLUCAP 236* 288* 188*     Scheduled Meds: . amLODipine  10 mg Oral Daily  . antiseptic oral rinse  15 mL Mouth Rinse q12n4p  . azelastine  2 spray Each Nare BID  . calcitonin (salmon)  1 spray Alternating Nares Daily  . chlorhexidine  15 mL Mouth Rinse BID  . Chlorhexidine Gluconate Cloth  6 each Topical Q0600  . cyanocobalamin  1,000 mcg Intramuscular Q30 days  . ertapenem  1 g Intravenous Q24H  . feeding supplement (RESOURCE BREEZE)  1 Container Oral TID BM  . furosemide  40 mg Oral BID  . insulin aspart  0-9 Units Subcutaneous TID WC  . insulin glargine  12 Units Subcutaneous QHS  . latanoprost  1 drop Both Eyes QHS  . levothyroxine  50 mcg Oral QAC breakfast  . lidocaine  1 patch Transdermal Q24H  . mupirocin ointment   Nasal BID  . sodium chloride  10-40 mL Intracatheter Q12H  . sodium chloride  3 mL Intravenous Q12H  . sotalol  80 mg Oral Q12H  . Warfarin - Pharmacist Dosing Inpatient   Does not apply q1800    Continuous Infusions: none    Jarold Motto MS, RD, LDN Pager: [phone] After-hours pager: [phone]

## 2013-06-06 NOTE — Progress Notes (Signed)
Patient ID: Alexandria [name], female   DOB: Dec 11, 1929, 79 y.o.   MRN: [id number]    Subjective: Pt still feels well today.  Scheduled to go to rehab today.  Objective: Vital signs in last 24 hours: Temp:  [97.7 F (36.5 C)-98.2 F (36.8 C)] 98 F (36.7 C) (12/10 0753) Pulse Rate:  [67-82] 82 (12/10 0753) Resp:  [18] 18 (12/10 0753) BP: (107-161)/(38-79) 161/79 mmHg (12/10 0753) SpO2:  [95 %-100 %] 97 % (12/10 0753) FiO2 (%):  [3 %] 3 % (12/10 0753) Last BM Date: 06/05/13  Intake/Output from previous day: 12/09 0701 - 12/10 0700 In: 700 [P.O.:480; IV Piggyback:50] Out: 125 [Urine:125] Intake/Output this shift: Total I/O In: 190 [P.O.:120; Other:20; IV Piggyback:50] Out: 225 [Urine:225]  PE: Abd: soft, NT, ND, +BS  Lab Results:   Recent Labs  06/05/13 0500 06/06/13 0500  WBC 8.6 9.4  HGB 10.6* 10.7*  HCT 33.9* 33.9*  PLT 233 228   BMET No results found for this basename: NA, K, CL, CO2, GLUCOSE, BUN, CREATININE, CALCIUM,  in the last 72 hours PT/INR  Recent Labs  06/05/13 0500 06/06/13 0500  LABPROT 21.3* 23.7*  INR 1.91* 2.20*   CMP     Component Value Date/Time   NA 137 06/01/2013 0400   NA 141 03/08/2013 1221   K 3.7 06/01/2013 0400   CL 95* 06/01/2013 0400   CO2 36* 06/01/2013 0400   GLUCOSE 234* 06/01/2013 0400   GLUCOSE 160* 03/08/2013 1221   BUN 6 06/01/2013 0400   BUN 20 03/08/2013 1221   CREATININE 0.53 06/01/2013 0400   CREATININE 0.96 12/05/2012 1251   CALCIUM 9.2 06/01/2013 0400   PROT 6.6 05/24/2013 0525   PROT 7.1 03/08/2013 1221   ALBUMIN 2.6* 05/24/2013 0525   AST 10 05/24/2013 0525   ALT 5 05/24/2013 0525   ALKPHOS 81 05/24/2013 0525   BILITOT 0.5 05/24/2013 0525   GFRNONAA 87* 06/01/2013 0400   GFRAA >90 06/01/2013 0400   Lipase     Component Value Date/Time   LIPASE 32 08/12/2012 0948       Studies/Results: No results found.  Anti-infectives: Anti-infectives   Start     Dose/Rate Route Frequency Ordered Stop   06/05/13 0830   ertapenem (INVANZ) 1 g in sodium chloride 0.9 % 50 mL IVPB     1 g 100 mL/hr over 30 Minutes Intravenous Every 24 hours 06/05/13 0816     06/05/13 0000  sodium chloride 0.9 % SOLN 50 mL with ertapenem 1 G SOLR 1 g     1 g 100 mL/hr over 30 Minutes Intravenous Every 24 hours 06/05/13 1138 06/17/13 2359   06/04/13 1000  amoxicillin-clavulanate (AUGMENTIN) 875-125 MG per tablet 1 tablet  Status:  Discontinued     1 tablet Oral Every 12 hours 06/04/13 0845 06/05/13 0816   05/31/13 1100  ertapenem (INVANZ) 1 g in sodium chloride 0.9 % 50 mL IVPB  Status:  Discontinued     1 g 100 mL/hr over 30 Minutes Intravenous Every 24 hours 05/31/13 0947 06/04/13 0845   05/29/13 2200  Ampicillin-Sulbactam (UNASYN) 3 g in sodium chloride 0.9 % 100 mL IVPB  Status:  Discontinued    Comments:  Switch from Invanz to unasyn   3 g 100 mL/hr over 60 Minutes Intravenous Every 6 hours 05/29/13 1943 05/31/13 0947   05/29/13 1800  Ampicillin-Sulbactam (UNASYN) 3 g in sodium chloride 0.9 % 100 mL IVPB  Status:  Discontinued    Comments:  Switch from Invanz to unasyn   3 g 100 mL/hr over 60 Minutes Intravenous Every 6 hours 05/29/13 1630 05/29/13 1943   05/29/13 1030  Ampicillin-Sulbactam (UNASYN) 3 g in sodium chloride 0.9 % 100 mL IVPB  Status:  Discontinued    Comments:  Switch from Invanz to unasyn   3 g 100 mL/hr over 60 Minutes Intravenous Every 6 hours 05/29/13 0926 05/29/13 1630   05/25/13 1130  ertapenem (INVANZ) 1 g in sodium chloride 0.9 % 50 mL IVPB  Status:  Discontinued     1 g 100 mL/hr over 30 Minutes Intravenous Every 24 hours 05/25/13 1126 05/29/13 0926   05/23/13 2200  metroNIDAZOLE (FLAGYL) IVPB 500 mg  Status:  Discontinued     500 mg 100 mL/hr over 60 Minutes Intravenous Every 8 hours 05/23/13 2140 05/25/13 1126   05/23/13 2145  ciprofloxacin (CIPRO) IVPB 400 mg  Status:  Discontinued     400 mg 200 mL/hr over 60 Minutes Intravenous Every 12 hours 05/23/13 2140 05/25/13 1126        Assessment/Plan  1. Diverticulitis with  Microperforation, improved Patient Active Problem List   Diagnosis Date Noted  . Atrial flutter 05/25/2013  . Diverticulitis of colon (without mention of hemorrhage) 05/23/2013  . Cardiac pacemaker in situ 05/23/2013  . ILD (interstitial lung disease) 05/23/2013  . Diverticulitis 05/23/2013  . Compression fracture of L1 lumbar vertebra 04/06/2013  . Afib 09/13/2012  . Vertigo 08/12/2012  . Pacemaker-[employer] 03/06/2012  . Chronic diastolic CHF (congestive heart failure) 01/23/2012  . Occlusion and stenosis of carotid artery without mention of cerebral infarction 01/19/2012  . Tachycardia-bradycardia syndrome 10/10/2011  . Hypomagnesemia 09/01/2011  . Weakness generalized 08/31/2011  . Leukocytosis 08/31/2011  . UTI (lower urinary tract infection) 08/17/2011  . Acute on chronic diastolic heart failure 05/20/2011  . EDEMA 10/22/2009  . Atrial fibrillation 09/05/2009  . Acute on chronic combined systolic and diastolic heart failure 07/18/2009  . CAD 06/16/2009  . OBESITY 02/25/2009  . PREMATURE ATRIAL CONTRACTIONS 02/25/2009  . DYSPNEA 02/25/2009  . DIABETES MELLITUS 07/28/2007  . HYPERLIPIDEMIA 07/28/2007  . HYPERTENSION 07/28/2007  . COLONIC POLYPS, ADENOMATOUS, HX OF 05/31/2007   Plan: 1. Stable from our standpoint still for dc to rehab.  IV Invanz set up for patient.  Recommend 10-14 additional days of IV Invanz. 2. She will follow up in our office in early January at already scheduled appointment time.   LOS: 14 days    Samona Chihuahua E 06/06/2013, 10:28 AM Pager: [phone]

## 2013-06-14 ENCOUNTER — Ambulatory Visit: Payer: Medicare Other | Admitting: Family Medicine

## 2013-06-18 ENCOUNTER — Telehealth: Payer: Self-pay | Admitting: Pharmacist

## 2013-06-18 NOTE — Telephone Encounter (Signed)
Patient has been at [facility] since 04/05/13.  Called to check on her.  She is doing well but she is recovering slowly.  Still has a PIC line.

## 2013-07-06 ENCOUNTER — Encounter (INDEPENDENT_AMBULATORY_CARE_PROVIDER_SITE_OTHER): Payer: Medicare Other | Admitting: General Surgery

## 2013-07-06 IMAGING — CR DG CHEST 1V
1 series · 1 of 1 positions shown · non-contrast
Comparison: Chest radiograph 05/30/2012

CLINICAL DATA: Hyperglycemia, short of breath

CHEST - 1 VIEW

[x chest ap]
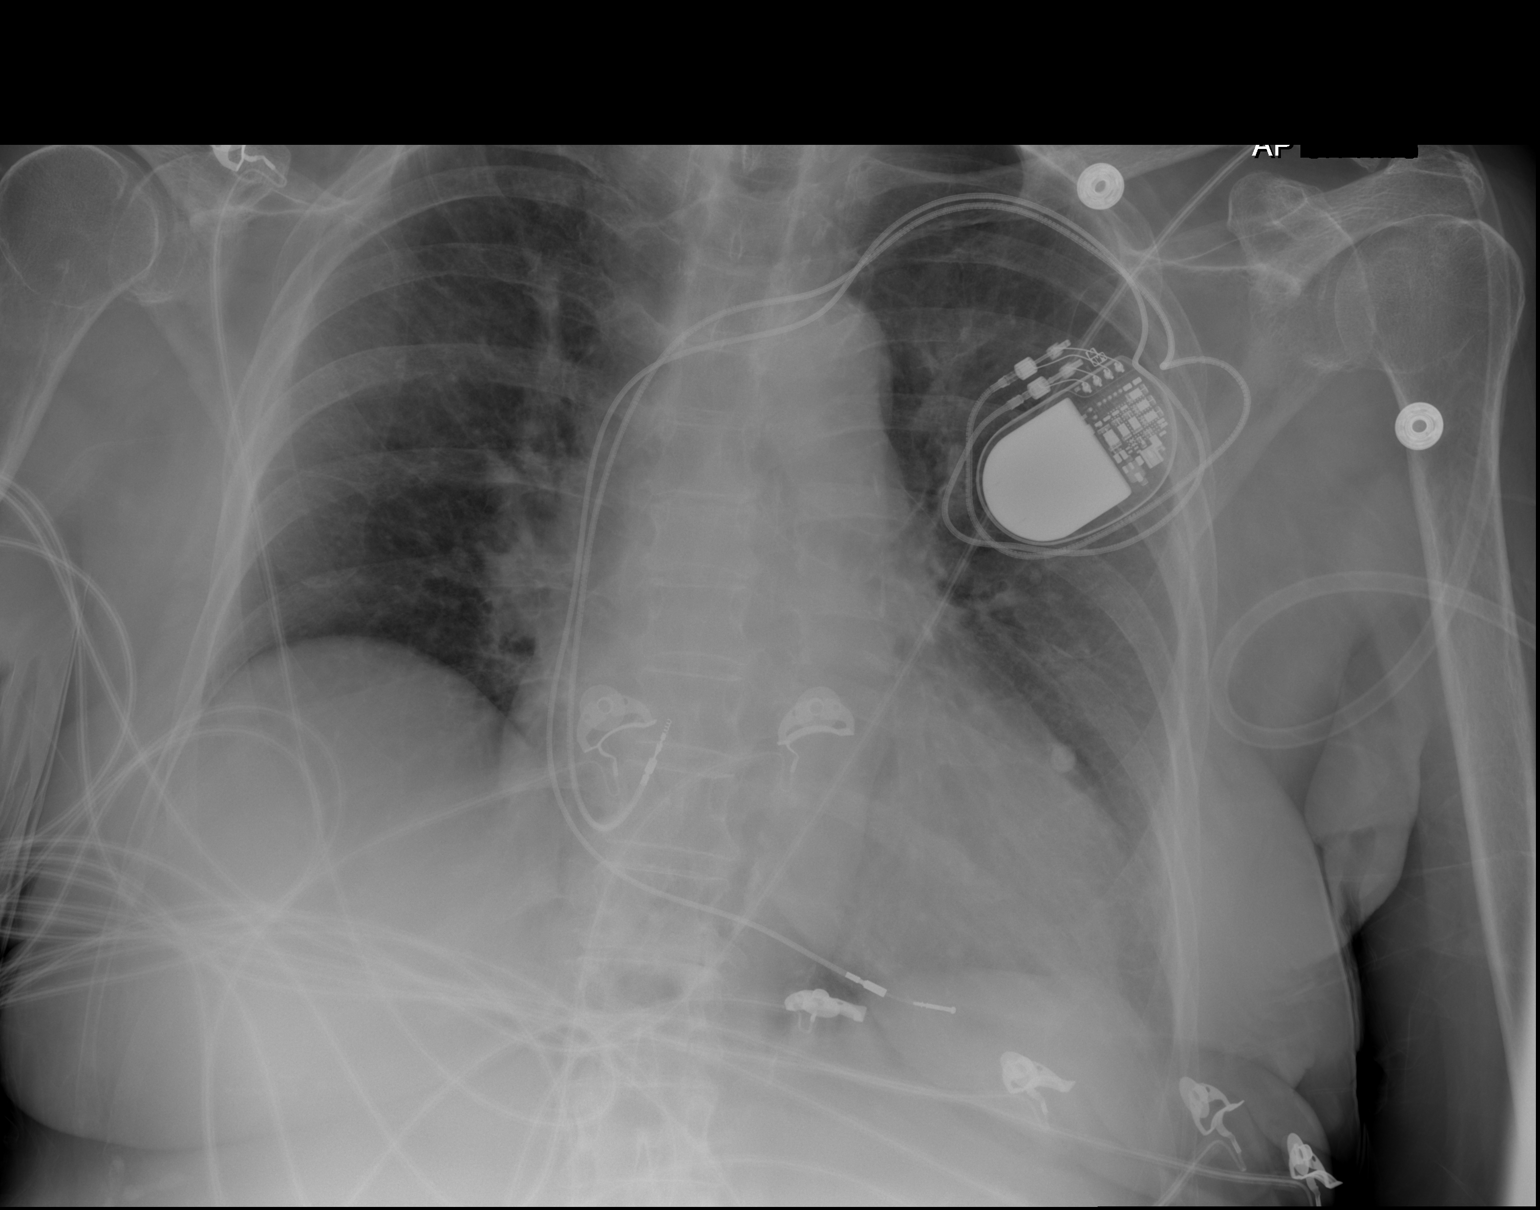

[1 of 1 positions shown; findings below may reference images not displayed]

FINDINGS: Left-sided pacemaker overlies stable cardiac silhouette.
There is improvement in the pulmonary edema pattern seen on prior.
Low lung volumes.  No pneumothorax.
IMPRESSION: Improved pulmonary edema pattern.

## 2013-07-10 ENCOUNTER — Encounter (INDEPENDENT_AMBULATORY_CARE_PROVIDER_SITE_OTHER): Payer: Self-pay | Admitting: General Surgery

## 2013-07-10 ENCOUNTER — Ambulatory Visit (INDEPENDENT_AMBULATORY_CARE_PROVIDER_SITE_OTHER): Payer: Medicare Other | Admitting: General Surgery

## 2013-07-10 VITALS — BP 110/65 | HR 75 | Temp 97.1°F | Resp 16

## 2013-07-10 DIAGNOSIS — K5732 Diverticulitis of large intestine without perforation or abscess without bleeding: Secondary | ICD-10-CM

## 2013-07-10 NOTE — Progress Notes (Signed)
Subjective:     Patient ID: Alexandria [name], female   DOB: [birth year], 80 y.o.   MRN: [id number]  HPI The patient is an 80 year old female who was recently released from the hospital the middle of December secondary to treated for diverticulitis. The patient was treated on an outpatient basis after discharge. She had a nonoperative course in the hospital. Patient states that since being discharged she's been having no pain in her abdomen.  The patient continues with her with her rehabilitation facility.  Review of Systems  Constitutional: Negative.   HENT: Negative.   Respiratory: Negative.   Cardiovascular: Negative.   Gastrointestinal: Negative.   Neurological: Negative.   All other systems reviewed and are negative.       Objective:   Physical Exam  Constitutional: She is oriented to person, place, and time. She appears well-developed and well-nourished.  HENT:  Head: Normocephalic and atraumatic.  Eyes: Conjunctivae and EOM are normal. Pupils are equal, round, and reactive to light.  Neck: Normal range of motion. Neck supple.  Cardiovascular: Normal rate, regular rhythm and normal heart sounds.   Pulmonary/Chest: Effort normal and breath sounds normal.  Abdominal: Soft. Bowel sounds are normal. She exhibits no distension and no mass. There is no tenderness. There is no rebound and no guarding.  Musculoskeletal: Normal range of motion.  Neurological: She is alert and oriented to person, place, and time.  Skin: Skin is warm and dry.  Psychiatric: She has a normal mood and affect.       Assessment:     80 year old female with her first episode of diverticulitis. Patient also has a long past medical history.    Plan:     1. I think the patient is due for a colonoscopy to survey her colon to look for any malignancy. The patient states she had this done approximately 4 years ago. 2. Based on these findings we can discuss further treatment at that time.  At this point she is a  high-risk surgical candidate and if there is no overt malignancy I would recommend not operating on this patient at this time.

## 2013-07-18 ENCOUNTER — Encounter: Payer: Medicare Other | Admitting: Cardiology

## 2013-08-14 ENCOUNTER — Other Ambulatory Visit: Payer: Self-pay | Admitting: Vascular Surgery

## 2013-08-14 DIAGNOSIS — I6529 Occlusion and stenosis of unspecified carotid artery: Secondary | ICD-10-CM

## 2013-08-15 ENCOUNTER — Encounter: Payer: Medicare Other | Admitting: Cardiology

## 2013-10-10 ENCOUNTER — Encounter: Payer: Medicare Other | Admitting: Cardiology

## 2013-10-11 ENCOUNTER — Encounter: Payer: Medicare Other | Admitting: Cardiology

## 2013-11-25 ENCOUNTER — Encounter: Payer: Self-pay | Admitting: Gastroenterology

## 2013-12-25 ENCOUNTER — Other Ambulatory Visit: Payer: Self-pay | Admitting: *Deleted

## 2013-12-25 DIAGNOSIS — E119 Type 2 diabetes mellitus without complications: Secondary | ICD-10-CM

## 2013-12-25 DIAGNOSIS — I5032 Chronic diastolic (congestive) heart failure: Secondary | ICD-10-CM

## 2014-01-23 ENCOUNTER — Other Ambulatory Visit (HOSPITAL_COMMUNITY): Payer: Medicare Other

## 2014-01-23 ENCOUNTER — Other Ambulatory Visit: Payer: Self-pay | Admitting: Pharmacist

## 2014-01-23 ENCOUNTER — Ambulatory Visit: Payer: Medicare Other | Admitting: Family

## 2014-01-23 ENCOUNTER — Ambulatory Visit: Payer: Medicare Other | Admitting: Vascular Surgery

## 2014-01-23 DIAGNOSIS — I480 Paroxysmal atrial fibrillation: Secondary | ICD-10-CM

## 2014-01-24 ENCOUNTER — Encounter: Payer: Self-pay | Admitting: Cardiology

## 2014-01-24 ENCOUNTER — Ambulatory Visit (INDEPENDENT_AMBULATORY_CARE_PROVIDER_SITE_OTHER): Payer: Medicare Other | Admitting: Cardiology

## 2014-01-24 VITALS — BP 130/60 | Ht 63.0 in | Wt 179.0 lb

## 2014-01-24 DIAGNOSIS — I2510 Atherosclerotic heart disease of native coronary artery without angina pectoris: Secondary | ICD-10-CM

## 2014-01-24 DIAGNOSIS — I6529 Occlusion and stenosis of unspecified carotid artery: Secondary | ICD-10-CM

## 2014-01-24 NOTE — Patient Instructions (Addendum)
Do wt. Daily  If your wt. Increases more than 3 lbs in 24 hrs give an extra 40 mg of laxix  Your physician recommends that you schedule a follow-up appointment in:  6 months with Dr.Hochrein  Keep taking your dose of coumadin and we will check your INR in 6 weeks

## 2014-01-24 NOTE — Progress Notes (Signed)
HPI The patient presents for  followup after multiple episodes of heart failure and atrial fibrillation.  I last saw her in the hospital in December when she had perforated  Diverticulum.at bedtime she did have some acute on chronic diastolic dysfunction. She also had fibrillation was treated with sotalol.  Since that time she has apparently been hospitalized at [facility] twice for respiratory failure. I don't have these records.  Her nephew says that it was not pulmonary edema or atrial fib.  She just got out of the hospital a couple of weeks ago. She staying at a nursing home in [place]. He take good care of her but they've not been doing daily weights because they have been inaccurate apparently. She's on chronic O2. She walks with a walker. She's not having any new shortness of breath since she was last hospitalized. She's not had any edema in she's not having any PND or orthopnea. She doesn't notice any palpitations, presyncope or syncope.  Allergies  Allergen Reactions  . Prednisone Other (See Comments)    Retained fluid REACTION: "retained fluid"  . Ace Inhibitors Cough       . Crestor [Rosuvastatin Calcium] Other (See Comments)    Fatigue and leg pain   . Lipitor [Atorvastatin Calcium] Other (See Comments)    Myalgia and fatigue   . Niaspan [Niacin] Other (See Comments)    unknown  . Olmesartan Cough  . Atorvastatin Other (See Comments)    Fatigue and leg pain  . Rosuvastatin Other (See Comments)    Fatigue and leg pain    Current Outpatient Prescriptions  Medication Sig Dispense Refill  . acetaminophen (TYLENOL) 325 MG tablet Take 650 mg by mouth every 4 (four) hours as needed for mild pain or fever.      Marland Kitchen aluminum-magnesium hydroxide-simethicone (MAALOX) 829-937-16 MG/5ML SUSP Take 20 mLs by mouth 3 (three) times daily as needed (indigestion).      Marland Kitchen amLODipine (NORVASC) 10 MG tablet Take 1 tablet (10 mg total) by mouth daily.      Marland Kitchen azelastine (ASTELIN) 137  MCG/SPRAY nasal spray Place 2 sprays into both nostrils 2 (two) times daily. Use in each nostril as directed      . bismuth subsalicylate (PEPTO BISMOL) 262 MG/15ML suspension Take 30 mLs by mouth as needed for diarrhea or loose stools.      . calcitonin, salmon, (MIACALCIN/FORTICAL) 200 UNIT/ACT nasal spray Place 1 spray into alternate nostrils daily.      . furosemide (LASIX) 80 MG tablet Take 1 tablet (80 mg total) by mouth 2 (two) times daily.  60 tablet  10  . guaiFENesin (ROBITUSSIN) 100 MG/5ML SOLN Take 10 mLs by mouth every 6 (six) hours as needed for cough or to loosen phlegm.      Marland Kitchen HYDROcodone-acetaminophen (NORCO/VICODIN) 5-325 MG per tablet Take 1 tablet by mouth every 4 (four) hours as needed for moderate pain.  30 tablet  0  . insulin aspart (NOVOLOG) 100 UNIT/ML injection Inject 0-9 Units into the skin 3 (three) times daily with meals.  1 vial  12  . insulin glargine (LANTUS) 100 UNIT/ML injection Inject 0.15 mLs (15 Units total) into the skin at bedtime.  10 mL  12  . isosorbide mononitrate (IMDUR) 60 MG 24 hr tablet Take 60 mg by mouth daily.      Marland Kitchen lactobacillus acidophilus & bulgar (LACTINEX) chewable tablet Chew 1 tablet by mouth 3 (three) times daily with meals.      . latanoprost (  XALATAN) 0.005 % ophthalmic solution Place 1 drop into both eyes at bedtime.       . levalbuterol (XOPENEX) 0.63 MG/3ML nebulizer solution Take 3 mLs (0.63 mg total) by nebulization every 6 (six) hours as needed for wheezing or shortness of breath.  3 mL  12  . levothyroxine (SYNTHROID, LEVOTHROID) 50 MCG tablet Take 50 mcg by mouth daily before breakfast.      . lidocaine (LIDODERM) 5 % Place 1 patch onto the skin daily. Left lumbar area; Remove & Discard patch within 12 hours or as directed by MD      . LORazepam (ATIVAN) 1 MG tablet Take 1 tablet (1 mg total) by mouth every 8 (eight) hours as needed for anxiety.  30 tablet  0  . senna (SENOKOT) 8.6 MG tablet Take 1 tablet by mouth 2 (two) times  daily as needed for constipation.      . sotalol (BETAPACE) 80 MG tablet Take 1 tablet (80 mg total) by mouth every 12 (twelve) hours.      Marland Kitchen warfarin (COUMADIN) 4 MG tablet Take 4 mg by mouth daily.      . white petrolatum (VASELINE) GEL Apply 1 application topically as needed for dry skin.      . [DISCONTINUED] diltiazem (TIAZAC) 420 MG 24 hr capsule Take 420 mg by mouth daily.       No current facility-administered medications for this visit.    Past Medical History  Diagnosis Date  . Hypertension     with h/o hypertensive urgency  . Glaucoma   . Tremor, essential   . Diabetes mellitus with neuropathy   . B12 deficiency   . Hyperlipidemia   . Atrial fibrillation     a. on chronic anticoagulation with warfarin  b. Tiksosyn discontinued and amiodarone  was discontinued secondary to perceived symptoms  . Chronic diastolic heart failure     a. echo 05/2009: mild LVH, EF 55-60%, grade 2 diast dysfxn, mild LAE, PASP;   b. Echo 3/13:   mod LVH, EF 55-60%, mild to mod calc AV annulus, very mild AS (mean 9 mmHg)  . CAD (coronary artery disease)     a.  b. 07/2011 NSTEMI - PCI/DES LCX - Resolute stent;;   b. LHC 12/27/11: mLAD 30%, prox-mid D1 90% (small);  CFX patent stent, OM3 small 95%,, mRCA 30%, EF 65%  -  med Rx;   . Carotid stenosis     a. dopplers 6/12: 1-39% bilat ICA; b. carotid dopplers 7/13: bilat 1-39% ICA  . Pulmonary fibrosis     Dr. Elsworth Soho;  patient taken off O2 in 8/12; dyspnea felt CHF>>ILD  . Skin cancer   . Colon polyps   . Syncope     unknown etiology  . Bradycardia   . Pacemaker 02/29/2012  . Diabetes mellitus     insulin controled  . Elevated TSH 05/2012  . Normocytic anemia 05/2012  . Diverticulitis   . Perforated bowel     Past Surgical History  Procedure Laterality Date  . Appendectomy    . Nose surgery    . Colonoscopy    . Cardiac catheterization  2013    stent  . Coronary angioplasty with stent placement    . Insert / replace / remove pacemaker   02/29/2012  . Dilation and curettage of uterus     ROS:   Otherwise as stated in the HPI and negative for all other systems.  PHYSICAL EXAM BP 130/60  Ht 5\' 3"  (  1.6 m)  Wt 179 lb (81.194 kg)  BMI 31.72 kg/m2 GENERAL:  Well appearing NECK:  No jugular venous distention at 90 degrees, waveform within normal limits, carotid upstroke brisk and symmetric, left bruit, no thyromegaly LYMPHATICS:  No cervical, inguinal adenopathy LUNGS:  Bilateral basilar crackles as evidence on previous exams. CHEST:  Well healed pacemaker pocket HEART:  PMI not displaced or sustained,S1 and S2 within normal limits, no S3, no S4, no clicks, no rubs, early peaking apical systolic murmur, no diastolic murmurs murmurs ABD:  Flat, positive bowel sounds normal in frequency in pitch, mid bruits, no rebound, no guarding, no midline pulsatile mass, the patient is seated EXT:  2 plus pulses throughout, mild ankle edema, no cyanosis no clubbing NEURO:  Cranial nerves II through XII grossly intact, motor grossly intact throughout, resting tremor  EKG:  Atrial paced rhythm, rate 73, axis within normal limits, old inferior infarct, poor anterior R wave progression, no ST-T wave changes.  Demand atrial pacing, premature ventricular contractions. Tremor artifact precludes adequate analysis. QT interval normal.  01/24/2014  ASSESSMENT AND PLAN  FIBRILLATION, ATRIAL -  She seems to be maintaining sinus rhythm on the Sotalol.  No change in therapy is indicated.  I will try to get the records from [facility]. I will encourage her to establish with a cardiologist there she is going to be there permanently.  HYPERTENSION -  Her blood pressure is controlled today. No change in therapy is indicated.  DIASTOLIC DYSFUNCTION -  She seems to be euvolemic. I would like to check her blood work. I wrote instructions for the nursing home to weigh her daily with when necessary Lasix if she gains more than 3 pounds in a day.  CAD -    She had nonobstructive disease and small vessel disease at the last cath in May 2013.  No change in therapy is indicated.

## 2014-02-28 ENCOUNTER — Encounter (INDEPENDENT_AMBULATORY_CARE_PROVIDER_SITE_OTHER): Payer: Medicare Other | Admitting: Ophthalmology

## 2014-03-07 ENCOUNTER — Encounter (INDEPENDENT_AMBULATORY_CARE_PROVIDER_SITE_OTHER): Payer: Medicare Other | Admitting: Ophthalmology

## 2014-03-07 DIAGNOSIS — E1139 Type 2 diabetes mellitus with other diabetic ophthalmic complication: Secondary | ICD-10-CM

## 2014-03-07 DIAGNOSIS — H43819 Vitreous degeneration, unspecified eye: Secondary | ICD-10-CM

## 2014-03-07 DIAGNOSIS — E11359 Type 2 diabetes mellitus with proliferative diabetic retinopathy without macular edema: Secondary | ICD-10-CM

## 2014-03-07 DIAGNOSIS — I10 Essential (primary) hypertension: Secondary | ICD-10-CM

## 2014-03-07 DIAGNOSIS — E1165 Type 2 diabetes mellitus with hyperglycemia: Secondary | ICD-10-CM

## 2014-03-07 DIAGNOSIS — H35039 Hypertensive retinopathy, unspecified eye: Secondary | ICD-10-CM

## 2014-04-28 DEATH — deceased

## 2014-06-06 ENCOUNTER — Encounter (HOSPITAL_COMMUNITY): Payer: Self-pay | Admitting: Cardiovascular Disease

## 2014-09-06 ENCOUNTER — Ambulatory Visit (INDEPENDENT_AMBULATORY_CARE_PROVIDER_SITE_OTHER): Payer: Medicare Other | Admitting: Ophthalmology
